# Patient Record
Sex: Male | Born: 1970
Health system: Southern US, Community
[De-identification: ages and names within clinical notes are randomized; demographics above are authoritative.]

## PROBLEM LIST (undated history)

## (undated) DIAGNOSIS — I5042 Chronic combined systolic (congestive) and diastolic (congestive) heart failure: Secondary | ICD-10-CM

## (undated) DIAGNOSIS — D619 Aplastic anemia, unspecified: Secondary | ICD-10-CM

## (undated) DIAGNOSIS — J189 Pneumonia, unspecified organism: Secondary | ICD-10-CM

## (undated) DIAGNOSIS — I10 Essential (primary) hypertension: Secondary | ICD-10-CM

## (undated) DIAGNOSIS — Z9289 Personal history of other medical treatment: Secondary | ICD-10-CM

## (undated) DIAGNOSIS — G4730 Sleep apnea, unspecified: Secondary | ICD-10-CM

## (undated) DIAGNOSIS — K759 Inflammatory liver disease, unspecified: Secondary | ICD-10-CM

## (undated) DIAGNOSIS — S36039A Unspecified laceration of spleen, initial encounter: Secondary | ICD-10-CM

## (undated) DIAGNOSIS — F1910 Other psychoactive substance abuse, uncomplicated: Secondary | ICD-10-CM

## (undated) DIAGNOSIS — M1990 Unspecified osteoarthritis, unspecified site: Secondary | ICD-10-CM

## (undated) HISTORY — PX: TIBIA FRACTURE SURGERY: SHX806

## (undated) HISTORY — PX: BONE MARROW ASPIRATION: SHX1252

## (undated) HISTORY — PX: ANKLE FRACTURE SURGERY: SHX122

## (undated) HISTORY — PX: FRACTURE SURGERY: SHX138

---

## 2000-01-13 ENCOUNTER — Emergency Department (HOSPITAL_COMMUNITY): Admission: EM | Admit: 2000-01-13 | Discharge: 2000-01-13 | Payer: Self-pay | Admitting: Emergency Medicine

## 2000-04-13 ENCOUNTER — Encounter: Payer: Self-pay | Admitting: Emergency Medicine

## 2000-04-13 ENCOUNTER — Emergency Department (HOSPITAL_COMMUNITY): Admission: EM | Admit: 2000-04-13 | Discharge: 2000-04-13 | Payer: Self-pay | Admitting: Emergency Medicine

## 2000-04-26 ENCOUNTER — Encounter: Payer: Self-pay | Admitting: Emergency Medicine

## 2000-04-26 ENCOUNTER — Emergency Department (HOSPITAL_COMMUNITY): Admission: EM | Admit: 2000-04-26 | Discharge: 2000-04-26 | Payer: Self-pay | Admitting: Emergency Medicine

## 2001-04-10 ENCOUNTER — Encounter: Payer: Self-pay | Admitting: Emergency Medicine

## 2001-04-10 ENCOUNTER — Emergency Department (HOSPITAL_COMMUNITY): Admission: EM | Admit: 2001-04-10 | Discharge: 2001-04-10 | Payer: Self-pay | Admitting: Emergency Medicine

## 2001-05-26 ENCOUNTER — Emergency Department (HOSPITAL_COMMUNITY): Admission: EM | Admit: 2001-05-26 | Discharge: 2001-05-26 | Payer: Self-pay

## 2009-04-18 ENCOUNTER — Emergency Department (HOSPITAL_COMMUNITY): Admission: EM | Admit: 2009-04-18 | Discharge: 2009-04-18 | Payer: Self-pay | Admitting: Emergency Medicine

## 2013-05-13 HISTORY — PX: INGUINAL HERNIA REPAIR: SUR1180

## 2014-10-17 ENCOUNTER — Inpatient Hospital Stay (HOSPITAL_COMMUNITY)
Admission: EM | Admit: 2014-10-17 | Discharge: 2014-10-20 | DRG: 194 | Disposition: A | Discharge status: Home / Self Care | Payer: Medicare Other | Attending: Internal Medicine | Admitting: Internal Medicine

## 2014-10-17 ENCOUNTER — Other Ambulatory Visit: Payer: Self-pay

## 2014-10-17 ENCOUNTER — Emergency Department (HOSPITAL_COMMUNITY): Payer: Medicare Other

## 2014-10-17 ENCOUNTER — Encounter (HOSPITAL_COMMUNITY): Payer: Self-pay

## 2014-10-17 DIAGNOSIS — Z8619 Personal history of other infectious and parasitic diseases: Secondary | ICD-10-CM

## 2014-10-17 DIAGNOSIS — E860 Dehydration: Secondary | ICD-10-CM | POA: Diagnosis not present

## 2014-10-17 DIAGNOSIS — N189 Chronic kidney disease, unspecified: Secondary | ICD-10-CM

## 2014-10-17 DIAGNOSIS — R079 Chest pain, unspecified: Secondary | ICD-10-CM | POA: Diagnosis not present

## 2014-10-17 DIAGNOSIS — J181 Lobar pneumonia, unspecified organism: Secondary | ICD-10-CM

## 2014-10-17 DIAGNOSIS — G4730 Sleep apnea, unspecified: Secondary | ICD-10-CM | POA: Diagnosis present

## 2014-10-17 DIAGNOSIS — F17210 Nicotine dependence, cigarettes, uncomplicated: Secondary | ICD-10-CM | POA: Diagnosis present

## 2014-10-17 DIAGNOSIS — F17200 Nicotine dependence, unspecified, uncomplicated: Secondary | ICD-10-CM | POA: Diagnosis not present

## 2014-10-17 DIAGNOSIS — Z720 Tobacco use: Secondary | ICD-10-CM

## 2014-10-17 DIAGNOSIS — M1990 Unspecified osteoarthritis, unspecified site: Secondary | ICD-10-CM | POA: Diagnosis present

## 2014-10-17 DIAGNOSIS — D613 Idiopathic aplastic anemia: Secondary | ICD-10-CM

## 2014-10-17 DIAGNOSIS — B182 Chronic viral hepatitis C: Secondary | ICD-10-CM | POA: Diagnosis not present

## 2014-10-17 DIAGNOSIS — R0602 Shortness of breath: Secondary | ICD-10-CM | POA: Diagnosis not present

## 2014-10-17 DIAGNOSIS — Z23 Encounter for immunization: Secondary | ICD-10-CM

## 2014-10-17 DIAGNOSIS — R0789 Other chest pain: Secondary | ICD-10-CM | POA: Diagnosis not present

## 2014-10-17 DIAGNOSIS — I10 Essential (primary) hypertension: Secondary | ICD-10-CM | POA: Diagnosis present

## 2014-10-17 DIAGNOSIS — D696 Thrombocytopenia, unspecified: Secondary | ICD-10-CM | POA: Diagnosis not present

## 2014-10-17 DIAGNOSIS — J189 Pneumonia, unspecified organism: Secondary | ICD-10-CM | POA: Diagnosis not present

## 2014-10-17 DIAGNOSIS — D609 Acquired pure red cell aplasia, unspecified: Secondary | ICD-10-CM | POA: Diagnosis not present

## 2014-10-17 DIAGNOSIS — N179 Acute kidney failure, unspecified: Secondary | ICD-10-CM

## 2014-10-17 DIAGNOSIS — D619 Aplastic anemia, unspecified: Secondary | ICD-10-CM | POA: Diagnosis present

## 2014-10-17 HISTORY — DX: Aplastic anemia, unspecified: D61.9

## 2014-10-17 HISTORY — DX: Unspecified osteoarthritis, unspecified site: M19.90

## 2014-10-17 HISTORY — DX: Inflammatory liver disease, unspecified: K75.9

## 2014-10-17 HISTORY — DX: Sleep apnea, unspecified: G47.30

## 2014-10-17 HISTORY — DX: Pneumonia, unspecified organism: J18.9

## 2014-10-17 HISTORY — DX: Essential (primary) hypertension: I10

## 2014-10-17 HISTORY — DX: Personal history of other medical treatment: Z92.89

## 2014-10-17 LAB — CBC WITH DIFFERENTIAL/PLATELET
Basophils Absolute: 0 10*3/uL (ref 0.0–0.1)
Basophils Relative: 0 % (ref 0–1)
Eosinophils Absolute: 0 10*3/uL (ref 0.0–0.7)
Eosinophils Relative: 0 % (ref 0–5)
HCT: 38.6 % — ABNORMAL LOW (ref 39.0–52.0)
Hemoglobin: 13.2 g/dL (ref 13.0–17.0)
Lymphocytes Relative: 17 % (ref 12–46)
Lymphs Abs: 0.9 10*3/uL (ref 0.7–4.0)
MCH: 34.6 pg — ABNORMAL HIGH (ref 26.0–34.0)
MCHC: 34.2 g/dL (ref 30.0–36.0)
MCV: 101 fL — ABNORMAL HIGH (ref 78.0–100.0)
Monocytes Absolute: 0.4 10*3/uL (ref 0.1–1.0)
Monocytes Relative: 8 % (ref 3–12)
Neutro Abs: 4.1 10*3/uL (ref 1.7–7.7)
Neutrophils Relative %: 75 % (ref 43–77)
Platelets: 68 10*3/uL — ABNORMAL LOW (ref 150–400)
RBC: 3.82 MIL/uL — ABNORMAL LOW (ref 4.22–5.81)
RDW: 15.7 % — ABNORMAL HIGH (ref 11.5–15.5)
WBC: 5.5 10*3/uL (ref 4.0–10.5)

## 2014-10-17 LAB — BASIC METABOLIC PANEL
Anion gap: 10 (ref 5–15)
BUN: 10 mg/dL (ref 6–20)
CO2: 24 mmol/L (ref 22–32)
Calcium: 9.1 mg/dL (ref 8.9–10.3)
Chloride: 102 mmol/L (ref 101–111)
Creatinine, Ser: 1.33 mg/dL — ABNORMAL HIGH (ref 0.61–1.24)
GFR calc Af Amer: >60 mL/min (ref >60)
GFR calc non Af Amer: >60 mL/min (ref >60)
Glucose, Bld: 109 mg/dL — ABNORMAL HIGH (ref 65–99)
Potassium: 4.4 mmol/L (ref 3.5–5.1)
Sodium: 136 mmol/L (ref 135–145)

## 2014-10-17 LAB — I-STAT TROPONIN, ED: Troponin i, poc: 0 ng/mL (ref 0.00–0.08)

## 2014-10-17 MED ORDER — IPRATROPIUM BROMIDE 0.02 % IN SOLN
0.5000 mg | Freq: Four times a day (QID) | RESPIRATORY_TRACT | Status: DC
  Administered 2014-10-17: 0.5 mg via RESPIRATORY_TRACT
  Filled 2014-10-17: qty 2.5

## 2014-10-17 MED ORDER — HYDROCODONE-ACETAMINOPHEN 5-325 MG PO TABS
1.0000 | ORAL_TABLET | ORAL | Status: DC | PRN
  Administered 2014-10-19: 1 via ORAL
  Filled 2014-10-17: qty 1

## 2014-10-17 MED ORDER — AZITHROMYCIN 500 MG PO TABS
500.0000 mg | ORAL_TABLET | ORAL | Status: DC
  Filled 2014-10-17: qty 1

## 2014-10-17 MED ORDER — SODIUM CHLORIDE 0.9 % IJ SOLN: 3.0000 mL | INTRAMUSCULAR | Status: DC | PRN

## 2014-10-17 MED ORDER — CEFTRIAXONE SODIUM 1 G IJ SOLR
1.0000 g | Freq: Once | INTRAMUSCULAR | Status: AC
  Administered 2014-10-17: 1 g via INTRAVENOUS
  Filled 2014-10-17: qty 10

## 2014-10-17 MED ORDER — SODIUM CHLORIDE 0.9 % IV SOLN: 250.0000 mL | INTRAVENOUS | Status: DC | PRN

## 2014-10-17 MED ORDER — ALBUTEROL SULFATE (2.5 MG/3ML) 0.083% IN NEBU
2.5000 mg | INHALATION_SOLUTION | Freq: Four times a day (QID) | RESPIRATORY_TRACT | Status: DC
  Administered 2014-10-17: 2.5 mg via RESPIRATORY_TRACT
  Filled 2014-10-17: qty 3

## 2014-10-17 MED ORDER — SODIUM CHLORIDE 0.9 % IV BOLUS (SEPSIS)
1000.0000 mL | Freq: Once | INTRAVENOUS | Status: AC
  Administered 2014-10-17: 1000 mL via INTRAVENOUS

## 2014-10-17 MED ORDER — ENOXAPARIN SODIUM 40 MG/0.4ML ~~LOC~~ SOLN
40.0000 mg | SUBCUTANEOUS | Status: DC
  Administered 2014-10-17: 40 mg via SUBCUTANEOUS
  Filled 2014-10-17 (×2): qty 0.4

## 2014-10-17 MED ORDER — PNEUMOCOCCAL VAC POLYVALENT 25 MCG/0.5ML IJ INJ
0.5000 mL | INJECTION | INTRAMUSCULAR | Status: AC
  Administered 2014-10-18: 0.5 mL via INTRAMUSCULAR
  Filled 2014-10-17: qty 0.5

## 2014-10-17 MED ORDER — CEFTRIAXONE SODIUM IN DEXTROSE 20 MG/ML IV SOLN
1.0000 g | INTRAVENOUS | Status: DC
  Filled 2014-10-17: qty 50

## 2014-10-17 MED ORDER — AZITHROMYCIN 250 MG PO TABS
500.0000 mg | ORAL_TABLET | Freq: Once | ORAL | Status: AC
Start: 2014-10-17 — End: 2014-10-17
  Administered 2014-10-17: 500 mg via ORAL
  Filled 2014-10-17: qty 2

## 2014-10-17 MED ORDER — ACETAMINOPHEN 325 MG PO TABS
650.0000 mg | ORAL_TABLET | Freq: Four times a day (QID) | ORAL | Status: DC | PRN
  Administered 2014-10-18: 650 mg via ORAL
  Filled 2014-10-17: qty 2

## 2014-10-17 MED ORDER — GUAIFENESIN ER 600 MG PO TB12
600.0000 mg | ORAL_TABLET | Freq: Two times a day (BID) | ORAL | Status: DC
  Administered 2014-10-18 – 2014-10-19 (×5): 600 mg via ORAL
  Filled 2014-10-17 (×8): qty 1

## 2014-10-17 MED ORDER — AZITHROMYCIN 500 MG PO TABS
500.0000 mg | ORAL_TABLET | ORAL | Status: DC
  Administered 2014-10-18 – 2014-10-19 (×2): 500 mg via ORAL
  Filled 2014-10-17 (×3): qty 1

## 2014-10-17 MED ORDER — ACETAMINOPHEN 650 MG RE SUPP: 650.0000 mg | Freq: Four times a day (QID) | RECTAL | Status: DC | PRN

## 2014-10-17 MED ORDER — ALBUTEROL SULFATE (2.5 MG/3ML) 0.083% IN NEBU
2.5000 mg | INHALATION_SOLUTION | RESPIRATORY_TRACT | Status: DC | PRN
Start: 2014-10-17 — End: 2014-10-20

## 2014-10-17 MED ORDER — KETOROLAC TROMETHAMINE 30 MG/ML IJ SOLN
30.0000 mg | Freq: Once | INTRAMUSCULAR | Status: AC
  Administered 2014-10-17: 30 mg via INTRAVENOUS
  Filled 2014-10-17: qty 1

## 2014-10-17 MED ORDER — CEFTRIAXONE SODIUM IN DEXTROSE 20 MG/ML IV SOLN
1.0000 g | INTRAVENOUS | Status: DC
  Administered 2014-10-18 – 2014-10-19 (×2): 1 g via INTRAVENOUS
  Filled 2014-10-17 (×3): qty 50

## 2014-10-17 MED ORDER — SODIUM CHLORIDE 0.9 % IJ SOLN
3.0000 mL | Freq: Two times a day (BID) | INTRAMUSCULAR | Status: DC
  Administered 2014-10-17 – 2014-10-18 (×3): 3 mL via INTRAVENOUS
  Administered 2014-10-19: 10 mL via INTRAVENOUS

## 2014-10-17 NOTE — H&P (Addendum)
[facility]                                                                                    Patient Demographics  Charles [name], is a 44 y.o. male  CSN: [id number]  MRN: [id number]  DOB - 08-29-1970  Admit Date - 10/17/2014  Outpatient Primary MD for the patient is No primary care provider on file.   With History of -  Past Medical History  Diagnosis Date  . Aplastic anemia       Past Surgical History  Procedure Laterality Date  . Ankle surgery Left   . Leg surgery Right     in for   Chief Complaint  Patient presents with  . Chest Pain  . Shortness of Breath     HPI  Charles [name]  is a 44 y.o. male, history of Aplastic anemia and hepatitis C, and who recently left jail around 4 months ago, presenting with 3 days history of increased shortness of breath, congestion  and nonproductive cough. No history of nausea or vomiting. He thinks that his roommate who is complaining of the same symptoms present might have given him this infection. No history of fever but complains of chills. In the emergency room the patient was noted to be febrile and his chest x-ray was positive for bilateral pneumonia. He tried over-the-counter medications which did not help    Review of Systems    In addition to the HPI above,  No Headache, No changes with Vision or hearing, No problems swallowing food or Liquids, No Abdominal pain, No Nausea or Vommitting, Bowel movements are regular, No Blood in stool or Urine, No dysuria, No new skin rashes or bruises, No new joints pains-aches,  No new weakness, tingling, numbness in any extremity, No recent weight gain or loss, No polyuria, polydypsia or polyphagia, No significant Mental Stressors.  A full 10 point Review of Systems was done, except as stated above, all other Review of Systems were negative.   Social History History  Substance Use Topics  . Smoking status: Current Every Day Smoker  . Smokeless tobacco: Not on  file  . Alcohol Use: Yes    Family History Significant for hypertension and his grandmother  Prior to Admission medications   Not on File    No Known Allergies  Physical Exam  Vitals  Blood pressure 129/68, pulse 97, temperature 98.5 F (36.9 C), temperature source Oral, resp. rate 29, height  (1.676 m), weight 90.719 kg (200 lb), SpO2 92 %.   1. General African-American male well-developed well-nourished  2. Normal affect and insight, Not Suicidal or Homicidal, Awake Alert, Oriented X 3.  3. No F.N deficits, ALL C.Nerves Intact, Strength 5/5 all 4 extremities, Sensation intact all 4 extremities, Plantars down going.  4. Ears and Eyes appear Normal, Conjunctivae clear, PERRLA. Moist Oral Mucosa.  5. Supple Neck, No JVD, No cervical lymphadenopathy appriciated, No Carotid Bruits.  6. Symmetrical Chest wall movement, decreased breath sounds at the bases no wheezing.  7. RRR, No Gallops, Rubs or Murmurs, No Parasternal Heave.  8. Positive Bowel Sounds, Abdomen Soft, Non tender, No organomegaly appriciated,No rebound -guarding  or rigidity.  9.  No Cyanosis, Normal Skin Turgor, No Skin Rash or Bruise.  10. Good muscle tone,  joints appear normal , no effusions, Normal ROM.  11. No Palpable Lymph Nodes in Neck or Axillae    Data Review  CBC  Recent Labs Lab 10/17/14 1643  WBC 5.5  HGB 13.2  HCT 38.6*  PLT 68*  MCV 101.0*  MCH 34.6*  MCHC 34.2  RDW 15.7*  LYMPHSABS 0.9  MONOABS 0.4  EOSABS 0.0  BASOSABS 0.0   ------------------------------------------------------------------------------------------------------------------  Chemistries   Recent Labs Lab 10/17/14 1643  NA 136  K 4.4  CL 102  CO2 24  GLUCOSE 109*  BUN 10  CREATININE 1.33*  CALCIUM 9.1   ------------------------------------------------------------------------------------------------------------------ estimated creatinine clearance is 75.6 mL/min (by C-G formula based on Cr  of 1.33). ------------------------------------------------------------------------------------------------------------------ No results for input(s): TSH, T4TOTAL, T3FREE, THYROIDAB in the last 72 hours.  Invalid input(s): FREET3   Coagulation profile No results for input(s): INR, PROTIME in the last 168 hours. ------------------------------------------------------------------------------------------------------------------- No results for input(s): DDIMER in the last 72 hours. -------------------------------------------------------------------------------------------------------------------  Cardiac Enzymes No results for input(s): CKMB, TROPONINI, MYOGLOBIN in the last 168 hours.  Invalid input(s): CK ------------------------------------------------------------------------------------------------------------------ Invalid input(s): POCBNP   ---------------------------------------------------------------------------------------------------------------  Urinalysis No results found for: COLORURINE, APPEARANCEUR, LABSPEC, PHURINE, GLUCOSEU, HGBUR, BILIRUBINUR, KETONESUR, PROTEINUR, UROBILINOGEN, NITRITE, LEUKOCYTESUR  ----------------------------------------------------------------------------------------------------------------  Imaging results:   Dg Chest 2 View  10/17/2014   CLINICAL DATA:  Chest pain, shortness of breath, and dizziness since this morning, smoker  EXAM: CHEST  2 VIEW  COMPARISON:  None  FINDINGS: Upper normal size of cardiac silhouette.  Mediastinal contours and pulmonary vascularity normal.  Diffuse BILATERAL pulmonary infiltrates slightly greater on RIGHT most likely representing pneumonia.  Minimal central peribronchial thickening.  No pleural effusion or pneumothorax.  Bones unremarkable.  IMPRESSION: BILATERAL pulmonary infiltrates slightly greater on RIGHT most consistent with pneumonia.   Electronically Signed   By: Ulyses Southward M.D.   On: 10/17/2014 13:06       Assessment & Plan  1. Bilateral pneumonia     Patient was started on IV Rocephin and Zithromax     Nebulizer treatment     Oxygen     Check pneumoniae antigens  2. History of aplastic anemia     Patient denies any history of treatment  3. History of hepatitis C probably according to the patient     Can be worked as outpatient     And arrange for follow-up after discharge        DVT Prophylaxis Lovenox  AM Labs Ordered, also please review Full Orders  Code Status full  Disposition Plan: Back home  Time spent in minutes : 32 minutes  Condition GUARDED   @SIGNATURE @

## 2014-10-17 NOTE — ED Notes (Signed)
Pt states he was lying down yesterday in bed and started having coughing, SOB, and chest pain. Thought it was his sinuses but it didn't help when he bought OTC meds. His chest hurts when he coughs and can't breathe.

## 2014-10-17 NOTE — ED Provider Notes (Signed)
CSN: [id number]     Arrival date & time 10/17/14  1223 History   First MD Initiated Contact with Patient 10/17/14 1634     Chief Complaint  Patient presents with  . Chest Pain  . Shortness of Breath     (Consider location/radiation/quality/duration/timing/severity/associated sxs/prior Treatment) HPI  Pt is a 44yo male with hx of aplastic anemia, presenting to ED with c/o sudden onset SOB, cough, congestion and bilateral chest pain that is worse with deep inspiration. Pt also c/o fatigue, feer and chills.  States he bought OTC cough medication last night but no relief. Symptoms worse when lying down.  States his roommate has had similar symptoms but has not been to doctor yet. Denies recent travel. Pt states he does not have a PCP as he was released from jail 4 months ago, has not established care yet.  Denies hx of asthma. Pt is a daily smoker.  Denies abdominal pain, n/v/d. Denies hx of CAD, DVT or PE.   Past Medical History  Diagnosis Date  . Aplastic anemia    Past Surgical History  Procedure Laterality Date  . Ankle surgery Left   . Leg surgery Right    No family history on file. History  Substance Use Topics  . Smoking status: Current Every Day Smoker  . Smokeless tobacco: Not on file  . Alcohol Use: Yes    Review of Systems  Constitutional: Positive for fever, chills, appetite change and fatigue. Negative for diaphoresis.  HENT: Positive for congestion. Negative for ear pain, sore throat, trouble swallowing and voice change.   Respiratory: Positive for cough and shortness of breath.   Cardiovascular: Positive for chest pain (bilateral). Negative for palpitations and leg swelling.  Gastrointestinal: Negative for nausea, vomiting, abdominal pain and diarrhea.  Musculoskeletal: Negative for myalgias and arthralgias.  All other systems reviewed and are negative.     Allergies  Review of patient's allergies indicates no known allergies.  Home Medications   Prior to  Admission medications   Not on File   BP 129/68 mmHg  Pulse 97  Temp(Src) 98.5 F (36.9 C) (Oral)  Resp 29  Ht 5\' 6"  (1.676 m)  Wt 200 lb (90.719 kg)  BMI 32.30 kg/m2  SpO2 92% Physical Exam  Constitutional: He is oriented to person, place, and time. He appears well-developed and well-nourished. He appears distressed.  Pt lying on Right side curled in blanket, appears uncomfortable.  HENT:  Head: Normocephalic and atraumatic.  Nose: Nose normal.  Mouth/Throat: Uvula is midline, oropharynx is clear and moist and mucous membranes are normal.  Eyes: Conjunctivae are normal. No scleral icterus.  Neck: Normal range of motion. Neck supple.  Cardiovascular: Regular rhythm and normal heart sounds.  Tachycardia present.   Pulmonary/Chest: Effort normal. No respiratory distress. He has no wheezes. He has rales. He exhibits no tenderness.  No respiratory distress, able to speak in full sentences w/o difficulty. Lungs: decreased breath sounds in RLQ with crackles. No wheeze. No chest wall tenderness.  Abdominal: Soft. Bowel sounds are normal. He exhibits no distension and no mass. There is no tenderness. There is no rebound and no guarding.  Musculoskeletal: Normal range of motion.  Neurological: He is alert and oriented to person, place, and time.  Skin: Skin is warm and dry. No rash noted. He is not diaphoretic.  Nursing note and vitals reviewed.   ED Course  Procedures (including critical care time) Labs Review Labs Reviewed  BASIC METABOLIC PANEL - Abnormal; Notable for the  following:    Glucose, Bld 109 (*)    Creatinine, Ser 1.33 (*)    All other components within normal limits  CBC WITH DIFFERENTIAL/PLATELET - Abnormal; Notable for the following:    RBC 3.82 (*)    HCT 38.6 (*)    MCV 101.0 (*)    MCH 34.6 (*)    RDW 15.7 (*)    Platelets 68 (*)    All other components within normal limits  I-STAT TROPOININ, ED    Imaging Review Dg Chest 2 View  10/17/2014   CLINICAL  DATA:  Chest pain, shortness of breath, and dizziness since this morning, smoker  EXAM: CHEST  2 VIEW  COMPARISON:  None  FINDINGS: Upper normal size of cardiac silhouette.  Mediastinal contours and pulmonary vascularity normal.  Diffuse BILATERAL pulmonary infiltrates slightly greater on RIGHT most likely representing pneumonia.  Minimal central peribronchial thickening.  No pleural effusion or pneumothorax.  Bones unremarkable.  IMPRESSION: BILATERAL pulmonary infiltrates slightly greater on RIGHT most consistent with pneumonia.   Electronically Signed   By: Ulyses Southward M.D.   On: 10/17/2014 13:06     EKG Interpretation   Date/Time:  Monday October 17 2014 12:29:51 EDT Ventricular Rate:  109 PR Interval:  130 QRS Duration: 62 QT Interval:  340 QTC Calculation: 457 R Axis:   82 Text Interpretation:  Sinus tachycardia Otherwise normal ECG since last  tracing no significant change Confirmed by BELFI  MD, MELANIE ([id number]) on  10/17/2014 5:22:50 PM      MDM   Final diagnoses:  Bilateral pneumonia  CAP (community acquired pneumonia)    Pt is a 44yo male presenting to ED with sudden onset bilateral chest pain, SOB, cough, hot and cold chills. Pt appears uncomfortable. SOB worse with movement and lying down. Decreased breath sounds in RLL fields.    CXR: c/w Bilateral pulmonary infiltrated most c/w pneumonia.  Pt is febrile with temp of 100.6, mild tachycardia.  Respirations increased to 29 with O2 Sat of 92% on RA. Discussed pt with Dr. Fredderick Phenix, will admit for continued care due to severity of lung findings and worsening vitals in ED.    6:28 PM Consulted with Dr. Sharyon Medicus, [facility], agreed to have pt admitted to [facility] for further treatment of bilateral pneumonia.   Junius Finner, PA-C 10/17/14 1829  Rolan Bucco, MD 10/17/14 [phone]

## 2014-10-17 NOTE — Progress Notes (Signed)
Pt received antibiotic in the ED, prior to blood cultures drawn. Made Dr. Sharyon Medicus aware. No new orders given, will continue to monitor.

## 2014-10-18 DIAGNOSIS — Z8619 Personal history of other infectious and parasitic diseases: Secondary | ICD-10-CM

## 2014-10-18 DIAGNOSIS — J189 Pneumonia, unspecified organism: Secondary | ICD-10-CM | POA: Diagnosis not present

## 2014-10-18 DIAGNOSIS — D609 Acquired pure red cell aplasia, unspecified: Secondary | ICD-10-CM

## 2014-10-18 DIAGNOSIS — E860 Dehydration: Secondary | ICD-10-CM

## 2014-10-18 LAB — STREP PNEUMONIAE URINARY ANTIGEN: Strep Pneumo Urinary Antigen: NEGATIVE

## 2014-10-18 LAB — CBC
HEMATOCRIT: 35.6 % — AB (ref 39.0–52.0)
HEMOGLOBIN: 12.1 g/dL — AB (ref 13.0–17.0)
MCH: 34.5 pg — AB (ref 26.0–34.0)
MCHC: 34 g/dL (ref 30.0–36.0)
MCV: 101.4 fL — AB (ref 78.0–100.0)
Platelets: 52 10*3/uL — ABNORMAL LOW (ref 150–400)
RBC: 3.51 MIL/uL — ABNORMAL LOW (ref 4.22–5.81)
RDW: 15.7 % — ABNORMAL HIGH (ref 11.5–15.5)
WBC: 4.2 10*3/uL (ref 4.0–10.5)

## 2014-10-18 MED ORDER — SODIUM CHLORIDE 0.9 % IV SOLN
INTRAVENOUS | Status: AC
Start: 2014-10-18 — End: 2014-10-19
  Administered 2014-10-18: 1000 mL via INTRAVENOUS
  Administered 2014-10-19: 03:00:00 via INTRAVENOUS

## 2014-10-18 MED ORDER — ALBUTEROL SULFATE (2.5 MG/3ML) 0.083% IN NEBU: 2.5000 mg | INHALATION_SOLUTION | Freq: Three times a day (TID) | RESPIRATORY_TRACT | Status: DC

## 2014-10-18 MED ORDER — IPRATROPIUM-ALBUTEROL 0.5-2.5 (3) MG/3ML IN SOLN
3.0000 mL | Freq: Three times a day (TID) | RESPIRATORY_TRACT | Status: DC
  Administered 2014-10-18 – 2014-10-20 (×7): 3 mL via RESPIRATORY_TRACT
  Filled 2014-10-18 (×7): qty 3

## 2014-10-18 NOTE — Care Management Note (Signed)
Case Management Note  Patient Details  Name: Charles [name] MRN: [id number] Date of Birth: 1970/10/21  Subjective/Objective:                 Pt lives in the extended stay hotel off of [address], that is paid for by medicaid. Pt states that he has medicaid for aplastic anemia.  He goes to a drug rehab program close to there that he states he put himself in. He states that he got out of prison 3-4 months ago, that he was incarcerated for two years for larceny. He also states that he has sister that is supportive and checks in on him frequently.  Action/Plan:  Will continue to follow for discharge plans. Expected Discharge Date:                  Expected Discharge Plan:  Home/Self Care (Lives at Extended stay off [facility]. )  In-House Referral:     Discharge planning Services     Post Acute Care Choice:    Choice offered to:     DME Arranged:    DME Agency:     HH Arranged:    HH Agency:     Status of Service:  In process, will continue to follow  Medicare Important Message Given:  No Date Medicare IM Given:    Medicare IM give by:    Date Additional Medicare IM Given:    Additional Medicare Important Message give by:     If discussed at Long Length of Stay Meetings, dates discussed:    Additional Comments:  Lawerance Sabal, RN 10/18/2014, 12:39 PM

## 2014-10-18 NOTE — Progress Notes (Signed)
[facility] PROGRESS NOTE  Sharon Bente ZOX:[id number] DOB: [birth year] DOA: 10/17/2014  PCP: No PCP Per Patient  Brief HPI: 44 year old African-American male with a past medical history of hepatitis C, aplastic anemia who was recently imprisoned until about 4 months ago, presented with a three-day history of increasing shortness of breath and a nonproductive cough. Chest x-ray revealed bilateral pneumonia. He was hospitalized for further management.  Past medical history:  Past Medical History  Diagnosis Date  . Hypertension   . History of blood transfusion "several"    "related to aplastic anemia"  . Aplastic anemia   . Hepatitis     "think it was B" (10/17/2014)  . Arthritis     "left ankle" (10/17/2014)  . Sleep apnea     "wore mask in prison; 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" (10/17/2014)  . Bilateral pneumonia 10/17/2014    Consultants: None  Procedures: None  Antibiotics: Ceftriaxone and azithromycin. 6/6  Subjective: Patient feels about the same today. Continues to have a dry cough. Some difficulty with breathing. Denies any chest pain. No nausea, vomiting. No abdominal discomfort.  Objective: Vital Signs  Filed Vitals:   10/17/14 2000 10/17/14 2042 10/18/14 0522 10/18/14 0739  BP:   126/79   Pulse:   83   Temp:   98.7 F (37.1 C)   TempSrc:   Oral   Resp: 20  18   Height:      Weight:      SpO2:  96% 97% 92%    Intake/Output Summary (Last 24 hours) at 10/18/14 0848 Last data filed at 10/18/14 0550  Gross per 24 hour  Intake      0 ml  Output    300 ml  Net   -300 ml   Filed Weights   10/17/14 1233 10/17/14 1944  Weight: 90.719 kg (200 lb) 88.6 kg (195 lb 5.2 oz)    General appearance: alert, cooperative, appears stated age and no distress Resp: Crackles bilateral bases. No wheezing. No rhonchi. Cardio: regular rate and rhythm, S1, S2 normal, no murmur, click, rub or gallop GI: soft, non-tender; bowel sounds normal; no masses,  no organomegaly Extremities:  extremities normal, atraumatic, no cyanosis or edema Neurologic: Alert and oriented 3. No focal neurological deficits.  Lab Results:  Basic Metabolic Panel:  Recent Labs Lab 10/17/14 1643  NA 136  K 4.4  CL 102  CO2 24  GLUCOSE 109*  BUN 10  CREATININE 1.33*  CALCIUM 9.1   CBC:  Recent Labs Lab 10/17/14 1643 10/18/14 0547  WBC 5.5 4.2  NEUTROABS 4.1  --   HGB 13.2 12.1*  HCT 38.6* 35.6*  MCV 101.0* 101.4*  PLT 68* 52*    Studies/Results: Dg Chest 2 View  10/17/2014   CLINICAL DATA:  Chest pain, shortness of breath, and dizziness since this morning, smoker  EXAM: CHEST  2 VIEW  COMPARISON:  None  FINDINGS: Upper normal size of cardiac silhouette.  Mediastinal contours and pulmonary vascularity normal.  Diffuse BILATERAL pulmonary infiltrates slightly greater on RIGHT most likely representing pneumonia.  Minimal central peribronchial thickening.  No pleural effusion or pneumothorax.  Bones unremarkable.  IMPRESSION: BILATERAL pulmonary infiltrates slightly greater on RIGHT most consistent with pneumonia.   Electronically Signed   By: Ulyses Southward M.D.   On: 10/17/2014 13:06    Medications:  Scheduled: . azithromycin  500 mg Oral Q24H  . cefTRIAXone (ROCEPHIN)  IV  1 g Intravenous Q24H  . guaiFENesin  600 mg  Oral BID  . ipratropium-albuterol  3 mL Nebulization TID  . sodium chloride  3 mL Intravenous Q12H   Continuous:  XBM:WUXLKG chloride, acetaminophen **OR** acetaminophen, albuterol, HYDROcodone-acetaminophen, sodium chloride  Assessment/Plan:  Active Problems:   Bilateral pneumonia   PNA (pneumonia)    Bilateral pneumonia/likely community-acquired Continue antibiotics. Urine for strep and legionella is pending. Order HIV test as well. Patient remains stable. Oxygen as needed. Mucinex.  History of aplastic anemia with thrombocytopenia Platelet counts did drop some today. We will discontinue the Lovenox. Monitor the counts closely. No overt bleeding.  Mild  dehydration Continue IV fluids. Recheck labs in the morning.  History of hepatitis C Check LFTs. Check hepatitis panel.  DVT Prophylaxis: SCDs. Stop Lovenox due to low platelet counts.    Code Status: Full code  Family Communication: Discussed with the patient  Disposition Plan: Continue current treatment. Will likely return home when improved. Social worker to find out if patient does have a place to stay.  Follow-up Appointment?: Needs PCP   LOS: 1 day   [facility]  [facility] Pager [phone] 10/18/2014, 8:48 AM  If 7PM-7AM, please contact night-coverage at [email or web address], password [facility]

## 2014-10-18 NOTE — Progress Notes (Signed)
Utilization Review completed. Jayonna Meyering RN BSN CM 

## 2014-10-19 DIAGNOSIS — J181 Lobar pneumonia, unspecified organism: Secondary | ICD-10-CM

## 2014-10-19 DIAGNOSIS — N179 Acute kidney failure, unspecified: Secondary | ICD-10-CM

## 2014-10-19 DIAGNOSIS — D696 Thrombocytopenia, unspecified: Secondary | ICD-10-CM

## 2014-10-19 DIAGNOSIS — F17200 Nicotine dependence, unspecified, uncomplicated: Secondary | ICD-10-CM

## 2014-10-19 DIAGNOSIS — Z720 Tobacco use: Secondary | ICD-10-CM

## 2014-10-19 DIAGNOSIS — N189 Chronic kidney disease, unspecified: Secondary | ICD-10-CM

## 2014-10-19 LAB — COMPREHENSIVE METABOLIC PANEL
ALK PHOS: 41 U/L (ref 38–126)
ALT: 22 U/L (ref 17–63)
ANION GAP: 9 (ref 5–15)
AST: 24 U/L (ref 15–41)
Albumin: 2.8 g/dL — ABNORMAL LOW (ref 3.5–5.0)
CO2: 24 mmol/L (ref 22–32)
Calcium: 8.6 mg/dL — ABNORMAL LOW (ref 8.9–10.3)
Chloride: 104 mmol/L (ref 101–111)
Creatinine, Ser: 1 mg/dL (ref 0.61–1.24)
GFR calc Af Amer: >60 mL/min (ref >60)
GFR calc non Af Amer: >60 mL/min (ref >60)
GLUCOSE: 114 mg/dL — AB (ref 65–99)
Potassium: 4 mmol/L (ref 3.5–5.1)
SODIUM: 137 mmol/L (ref 135–145)
Total Bilirubin: 0.7 mg/dL (ref 0.3–1.2)
Total Protein: 6.5 g/dL (ref 6.5–8.1)

## 2014-10-19 LAB — CBC
HCT: 35.1 % — ABNORMAL LOW (ref 39.0–52.0)
Hemoglobin: 12.1 g/dL — ABNORMAL LOW (ref 13.0–17.0)
MCH: 34.5 pg — AB (ref 26.0–34.0)
MCHC: 34.5 g/dL (ref 30.0–36.0)
MCV: 100 fL (ref 78.0–100.0)
Platelets: 59 10*3/uL — ABNORMAL LOW (ref 150–400)
RBC: 3.51 MIL/uL — ABNORMAL LOW (ref 4.22–5.81)
RDW: 15 % (ref 11.5–15.5)
WBC: 3.8 10*3/uL — ABNORMAL LOW (ref 4.0–10.5)

## 2014-10-19 LAB — LEGIONELLA ANTIGEN, URINE

## 2014-10-19 LAB — HIV ANTIBODY (ROUTINE TESTING W REFLEX): HIV SCREEN 4TH GENERATION: NONREACTIVE

## 2014-10-19 MED ORDER — SALINE SPRAY 0.65 % NA SOLN
1.0000 | NASAL | Status: DC | PRN
  Administered 2014-10-19: 1 via NASAL
  Filled 2014-10-19: qty 44

## 2014-10-19 NOTE — Progress Notes (Signed)
PROGRESS NOTE  Charles [name]:[id number] DOB: 02-Jul-1970 DOA: 10/17/2014 PCP: No PCP Per Patient Brief history 44 year old African-American male with a past medical history of hepatitis C, aplastic anemia who was recently imprisoned until about 4 months ago, presented with a three-day history of increasing shortness of breath and a nonproductive cough. Chest x-ray revealed bilateral pneumonia. He was hospitalized for further management. Assessment/Plan:  Bilateral pneumonia/likely community-acquired Continue antibiotics.  -Urine for strep and legionella neg.  -HIV test--neg -Continue Mucinex. -Clinically stable on room air -Will need appointment at [facility] wellness clinic  History of aplastic anemia with thrombocytopenia Platelet counts did drop some today but this is partly due to dilution from IVF -Discontinued Lovenox  -No overt signs of bleeding  -Hemoglobin stable  -Check serum B12  -HIV negative  Mild dehydration/AKI -Improving with Continued IV fluids.   History of hepatitis C Check LFTs--normal -Check viral hepatitis panel--pending Tobacco abuse -Tobacco cessation discussed  DVT Prophylaxis: SCDs. Stop Lovenox due to low platelet counts.  Code Status: Full code  Family Communication: Discussed with the patient and niece at bedside Disposition Plan: home 6/9 if stable       Procedures/Studies: Dg Chest 2 View  10/17/2014   CLINICAL DATA:  Chest pain, shortness of breath, and dizziness since this morning, smoker  EXAM: CHEST  2 VIEW  COMPARISON:  None  FINDINGS: Upper normal size of cardiac silhouette.  Mediastinal contours and pulmonary vascularity normal.  Diffuse BILATERAL pulmonary infiltrates slightly greater on RIGHT most likely representing pneumonia.  Minimal central peribronchial thickening.  No pleural effusion or pneumothorax.  Bones unremarkable.  IMPRESSION: BILATERAL pulmonary infiltrates slightly greater on RIGHT most consistent  with pneumonia.   Electronically Signed   By: Ulyses Southward M.D.   On: 10/17/2014 13:06         Subjective:  patient is breathing 50% better. He denies any fevers, chills, chest pain, nausea, vomiting, diarrhea, abdominal pain. No hemoptysis.   Objective: Filed Vitals:   10/18/14 2042 10/19/14 0549 10/19/14 0734 10/19/14 1417  BP:  134/85  149/75  Pulse:  77  93  Temp:  98.4 F (36.9 C)  99.1 F (37.3 C)  TempSrc:  Oral  Oral  Resp:  18  16  Height:      Weight:      SpO2: 99% 99% 94% 97%    Intake/Output Summary (Last 24 hours) at 10/19/14 1928 Last data filed at 10/19/14 1417  Gross per 24 hour  Intake 1748.83 ml  Output   2590 ml  Net -841.17 ml   Weight change:  Exam:   General:  Pt is alert, follows commands appropriately, not in acute distress  HEENT: No icterus, No thrush [place]/AT  Cardiovascular: RRR, S1/S2, no rubs, no gallops  Respiratory: bibasilar crackles without any wheeze   Abdomen: Soft/+BS, non tender, non distended, no guarding  Extremities: No edema, No lymphangitis, No petechiae, No rashes, no synovitis  Data Reviewed: Basic Metabolic Panel:  Recent Labs Lab 10/17/14 1643 10/19/14 0711  NA 136 137  K 4.4 4.0  CL 102 104  CO2 24 24  GLUCOSE 109* 114*  BUN 10 <5*  CREATININE 1.33* 1.00  CALCIUM 9.1 8.6*   Liver Function Tests:  Recent Labs Lab 10/19/14 0711  AST 24  ALT 22  ALKPHOS 41  BILITOT 0.7  PROT 6.5  ALBUMIN 2.8*   No results for input(s): LIPASE, AMYLASE in the last  168 hours. No results for input(s): AMMONIA in the last 168 hours. CBC:  Recent Labs Lab 10/17/14 1643 10/18/14 0547 10/19/14 0711  WBC 5.5 4.2 3.8*  NEUTROABS 4.1  --   --   HGB 13.2 12.1* 12.1*  HCT 38.6* 35.6* 35.1*  MCV 101.0* 101.4* 100.0  PLT 68* 52* 59*   Cardiac Enzymes: No results for input(s): CKTOTAL, CKMB, CKMBINDEX, TROPONINI in the last 168 hours. BNP: Invalid input(s): POCBNP CBG: No results for input(s): GLUCAP in the  last 168 hours.  Recent Results (from the past 240 hour(s))  Culture, blood (routine x 2) Call MD if unable to obtain prior to antibiotics being given     Status: None (Preliminary result)   Collection Time: 10/17/14  8:50 PM  Result Value Ref Range Status   Specimen Description BLOOD RIGHT ANTECUBITAL  Final   Special Requests BOTTLES DRAWN AEROBIC AND ANAEROBIC 10CC EA  Final   Culture   Final           BLOOD CULTURE RECEIVED NO GROWTH TO DATE CULTURE WILL BE HELD FOR 5 DAYS BEFORE ISSUING A FINAL NEGATIVE REPORT Note: Culture results may be compromised due to an excessive volume of blood received in culture bottles. Performed at [facility]    Report Status PENDING  Incomplete  Culture, blood (routine x 2) Call MD if unable to obtain prior to antibiotics being given     Status: None (Preliminary result)   Collection Time: 10/17/14  9:04 PM  Result Value Ref Range Status   Specimen Description BLOOD LEFT ARM  Final   Special Requests   Final    BOTTLES DRAWN AEROBIC AND ANAEROBIC 10CC BLUE 5CC PURPLE   Culture   Final           BLOOD CULTURE RECEIVED NO GROWTH TO DATE CULTURE WILL BE HELD FOR 5 DAYS BEFORE ISSUING A FINAL NEGATIVE REPORT Performed at [facility]    Report Status PENDING  Incomplete     Scheduled Meds: . azithromycin  500 mg Oral Q24H  . cefTRIAXone (ROCEPHIN)  IV  1 g Intravenous Q24H  . guaiFENesin  600 mg Oral BID  . ipratropium-albuterol  3 mL Nebulization TID  . sodium chloride  3 mL Intravenous Q12H   Continuous Infusions:    Lawyer Washabaugh, DO  [facility] Pager [phone]  If 7PM-7AM, please contact night-coverage [email or web address] Password [email or web address] 10/19/2014, 7:28 PM   LOS: 2 days

## 2014-10-19 NOTE — Progress Notes (Signed)
Patient received pamphlet from [facility]. CM explained to patient that they may use the on site pharmacy to fill prescriptions given to them at discharge. Patient aware that the [facility] will not fill narcotics or pain medications prior to the patient being seen by one of their physicians.  Patient aware that they must be seen as a patient prior to the pharmacy filling the prescriptions a second time. Either follow up appointment date and time entered into AVS, or instructions for patient to call and schedule appointment after discharge. 

## 2014-10-20 DIAGNOSIS — N179 Acute kidney failure, unspecified: Secondary | ICD-10-CM

## 2014-10-20 LAB — BASIC METABOLIC PANEL
Anion gap: 7 (ref 5–15)
BUN: 5 mg/dL — ABNORMAL LOW (ref 6–20)
CO2: 27 mmol/L (ref 22–32)
Calcium: 9 mg/dL (ref 8.9–10.3)
Chloride: 103 mmol/L (ref 101–111)
Creatinine, Ser: 1.06 mg/dL (ref 0.61–1.24)
GFR calc Af Amer: >60 mL/min (ref >60)
GFR calc non Af Amer: >60 mL/min (ref >60)
GLUCOSE: 116 mg/dL — AB (ref 65–99)
Potassium: 4.2 mmol/L (ref 3.5–5.1)
Sodium: 137 mmol/L (ref 135–145)

## 2014-10-20 LAB — HEPATITIS PANEL, ACUTE
Hep A IgM: NEGATIVE
Hep B C IgM: NEGATIVE
Hepatitis B Surface Ag: NEGATIVE

## 2014-10-20 LAB — CBC
HCT: 36.2 % — ABNORMAL LOW (ref 39.0–52.0)
Hemoglobin: 12.6 g/dL — ABNORMAL LOW (ref 13.0–17.0)
MCH: 34.7 pg — ABNORMAL HIGH (ref 26.0–34.0)
MCHC: 34.8 g/dL (ref 30.0–36.0)
MCV: 99.7 fL (ref 78.0–100.0)
Platelets: 59 10*3/uL — ABNORMAL LOW (ref 150–400)
RBC: 3.63 MIL/uL — ABNORMAL LOW (ref 4.22–5.81)
RDW: 14.9 % (ref 11.5–15.5)
WBC: 3.8 10*3/uL — AB (ref 4.0–10.5)

## 2014-10-20 LAB — VITAMIN B12: Vitamin B-12: 299 pg/mL (ref 180–914)

## 2014-10-20 MED ORDER — FLUTICASONE PROPIONATE 50 MCG/ACT NA SUSP
2.0000 | Freq: Every day | NASAL | Status: DC
  Filled 2014-10-20: qty 16

## 2014-10-20 MED ORDER — AZITHROMYCIN 500 MG PO TABS: 500.0000 mg | ORAL_TABLET | ORAL | Status: DC

## 2014-10-20 MED ORDER — FLUTICASONE PROPIONATE 50 MCG/ACT NA SUSP: 2.0000 | Freq: Every day | NASAL | Status: DC

## 2014-10-20 MED ORDER — CEFDINIR 300 MG PO CAPS: 300.0000 mg | ORAL_CAPSULE | Freq: Two times a day (BID) | ORAL | Status: DC

## 2014-10-20 NOTE — Progress Notes (Signed)
Pt discharging to home, Telemetry dc'd, IV dc'd. Vitals stable. Discharge instructions and education reviewed with pt all questions addressed. 10/20/2014 10:09 AM Charles [name]
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
## 2014-10-24 LAB — CULTURE, BLOOD (ROUTINE X 2)
CULTURE: NO GROWTH
Culture: NO GROWTH

## 2014-11-24 DIAGNOSIS — D619 Aplastic anemia, unspecified: Secondary | ICD-10-CM | POA: Diagnosis not present

## 2014-12-23 DIAGNOSIS — I10 Essential (primary) hypertension: Secondary | ICD-10-CM | POA: Diagnosis not present

## 2015-03-10 ENCOUNTER — Emergency Department (HOSPITAL_COMMUNITY)
Admission: EM | Admit: 2015-03-10 | Discharge: 2015-03-10 | Disposition: A | Discharge status: Home / Self Care | Payer: Medicare Other | Attending: Emergency Medicine | Admitting: Emergency Medicine

## 2015-03-10 ENCOUNTER — Emergency Department (HOSPITAL_COMMUNITY): Payer: Medicare Other

## 2015-03-10 ENCOUNTER — Encounter (HOSPITAL_COMMUNITY): Payer: Self-pay | Admitting: Emergency Medicine

## 2015-03-10 DIAGNOSIS — Z8669 Personal history of other diseases of the nervous system and sense organs: Secondary | ICD-10-CM | POA: Insufficient documentation

## 2015-03-10 DIAGNOSIS — Z862 Personal history of diseases of the blood and blood-forming organs and certain disorders involving the immune mechanism: Secondary | ICD-10-CM | POA: Insufficient documentation

## 2015-03-10 DIAGNOSIS — Z8719 Personal history of other diseases of the digestive system: Secondary | ICD-10-CM | POA: Diagnosis not present

## 2015-03-10 DIAGNOSIS — B9689 Other specified bacterial agents as the cause of diseases classified elsewhere: Secondary | ICD-10-CM | POA: Diagnosis not present

## 2015-03-10 DIAGNOSIS — N5089 Other specified disorders of the male genital organs: Secondary | ICD-10-CM

## 2015-03-10 DIAGNOSIS — I10 Essential (primary) hypertension: Secondary | ICD-10-CM | POA: Insufficient documentation

## 2015-03-10 DIAGNOSIS — Z720 Tobacco use: Secondary | ICD-10-CM | POA: Insufficient documentation

## 2015-03-10 DIAGNOSIS — Z8739 Personal history of other diseases of the musculoskeletal system and connective tissue: Secondary | ICD-10-CM | POA: Insufficient documentation

## 2015-03-10 DIAGNOSIS — Z8701 Personal history of pneumonia (recurrent): Secondary | ICD-10-CM | POA: Diagnosis not present

## 2015-03-10 DIAGNOSIS — N50812 Left testicular pain: Secondary | ICD-10-CM

## 2015-03-10 DIAGNOSIS — Z792 Long term (current) use of antibiotics: Secondary | ICD-10-CM | POA: Diagnosis not present

## 2015-03-10 DIAGNOSIS — R1909 Other intra-abdominal and pelvic swelling, mass and lump: Secondary | ICD-10-CM | POA: Diagnosis present

## 2015-03-10 DIAGNOSIS — N453 Epididymo-orchitis: Secondary | ICD-10-CM | POA: Diagnosis not present

## 2015-03-10 DIAGNOSIS — N433 Hydrocele, unspecified: Secondary | ICD-10-CM | POA: Diagnosis not present

## 2015-03-10 DIAGNOSIS — Z7951 Long term (current) use of inhaled steroids: Secondary | ICD-10-CM | POA: Diagnosis not present

## 2015-03-10 LAB — URINALYSIS, ROUTINE W REFLEX MICROSCOPIC
BILIRUBIN URINE: NEGATIVE
GLUCOSE, UA: NEGATIVE mg/dL
KETONES UR: NEGATIVE mg/dL
Leukocytes, UA: NEGATIVE
Nitrite: NEGATIVE
Protein, ur: NEGATIVE mg/dL
SPECIFIC GRAVITY, URINE: 1.013 (ref 1.005–1.030)
Urobilinogen, UA: 1 mg/dL (ref 0.0–1.0)
pH: 6 (ref 5.0–8.0)

## 2015-03-10 LAB — CBC
HCT: 34.8 % — ABNORMAL LOW (ref 39.0–52.0)
Hemoglobin: 12.2 g/dL — ABNORMAL LOW (ref 13.0–17.0)
MCH: 34.7 pg — ABNORMAL HIGH (ref 26.0–34.0)
MCHC: 35.1 g/dL (ref 30.0–36.0)
MCV: 98.9 fL (ref 78.0–100.0)
Platelets: 54 10*3/uL — ABNORMAL LOW (ref 150–400)
RBC: 3.52 MIL/uL — ABNORMAL LOW (ref 4.22–5.81)
RDW: 14 % (ref 11.5–15.5)
WBC: 5.9 10*3/uL (ref 4.0–10.5)

## 2015-03-10 LAB — BASIC METABOLIC PANEL
Anion gap: 7 (ref 5–15)
BUN: 8 mg/dL (ref 6–20)
CHLORIDE: 100 mmol/L — AB (ref 101–111)
CO2: 25 mmol/L (ref 22–32)
CREATININE: 1.15 mg/dL (ref 0.61–1.24)
Calcium: 8.9 mg/dL (ref 8.9–10.3)
GFR calc Af Amer: >60 mL/min (ref >60)
GFR calc non Af Amer: >60 mL/min (ref >60)
GLUCOSE: 128 mg/dL — AB (ref 65–99)
Potassium: 3.3 mmol/L — ABNORMAL LOW (ref 3.5–5.1)
SODIUM: 132 mmol/L — AB (ref 135–145)

## 2015-03-10 LAB — URINE MICROSCOPIC-ADD ON

## 2015-03-10 MED ORDER — AZITHROMYCIN 250 MG PO TABS
1000.0000 mg | ORAL_TABLET | Freq: Once | ORAL | Status: AC
  Administered 2015-03-10: 1000 mg via ORAL
  Filled 2015-03-10: qty 4

## 2015-03-10 MED ORDER — MORPHINE SULFATE (PF) 4 MG/ML IV SOLN
4.0000 mg | Freq: Once | INTRAVENOUS | Status: AC
  Administered 2015-03-10: 4 mg via INTRAVENOUS
  Filled 2015-03-10: qty 1

## 2015-03-10 MED ORDER — CEFTRIAXONE SODIUM 250 MG IJ SOLR
250.0000 mg | Freq: Once | INTRAMUSCULAR | Status: AC
  Administered 2015-03-10: 250 mg via INTRAMUSCULAR
  Filled 2015-03-10: qty 250

## 2015-03-10 MED ORDER — DOXYCYCLINE HYCLATE 100 MG PO CAPS: 100.0000 mg | ORAL_CAPSULE | Freq: Two times a day (BID) | ORAL | Status: DC

## 2015-03-10 MED ORDER — ONDANSETRON HCL 4 MG/2ML IJ SOLN
4.0000 mg | Freq: Once | INTRAMUSCULAR | Status: AC
  Administered 2015-03-10: 4 mg via INTRAVENOUS
  Filled 2015-03-10: qty 2

## 2015-03-10 NOTE — ED Notes (Signed)
Patient transported to Ultrasound 
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
## 2015-03-10 NOTE — ED Notes (Signed)
Pt requested pain meds.  Pa at bedside.

## 2015-03-10 NOTE — Discharge Instructions (Signed)
Orchitis Orchitis is swelling (inflammation) of a testicle caused by infection. Testicles are the male organs that produce sperm. The testicles are held in a fleshy sac (scrotum) located behind the penis. Orchitis usually affects only one testicle, but it can occur in both. The condition can develop suddenly. Orchitis can be caused by many different kinds of bacteria and viruses. CAUSES Orchitis can be caused by either a bacterial or viral infection. Bacterial Infections  These often occur along with an infection of the coiled tube that collects sperm and sits on top of the testicle (epididymis).  In men who are not sexually active, bacterial orchitis usually starts as a urinary tract infection and spreads to the testicle.  In sexually active men, sexually transmitted infections are the most common cause of bacterial orchitis. These can include:  Gonorrhea.  Chlamydia. Viral Infections  Mumps is still the most common cause of viral orchitis, though mumps is now rare in many areas because of vaccination.  Other viruses that can cause orchitis include:  The chickenpox virus (varicella-zoster virus).  The virus that causes mononucleosis (Epstein-Barr virus). RISK FACTORS Boys and men who have not been vaccinated against mumps are at risk for mumps orchitis. Risk factors for bacterial orchitis include:  Frequent urinary tract infections.  High-risk sexual behaviors.  Having a sexual partner with a sexually transmitted infection.  Having had urinary tract surgery.  Using a tube passed through the penis to drain urine (Foley catheter).  An enlarged prostate gland. SIGNS AND SYMPTOMS The most common symptoms of orchitis are swelling and pain in the scrotum. Other signs and symptoms may include:  Feeling generally sick (malaise).  Fever and chills.  Painful urination.  Painful ejaculation.  Blood or discharge from the  penis.  Nausea.  Headache.  Fatigue. DIAGNOSIS Your health care provider may suspect orchitis if you have a painful, swollen testicle along with other signs and symptoms of the condition. A physical exam will be done. Tests may also be done to help your health care provider make a diagnosis. These may include:  A blood test to check for signs of infection.  A urine test to check for a urinary tract infection.  Using a swab to collect a fluid sample from the tip of the penis to test for sexually transmitted infections.  Taking an image of the testicle using sound waves and a computer (testicular ultrasound). TREATMENT Treatment of orchitis depends on the cause. For orchitis caused by a bacterial infection, your health care provider will most likely prescribe antibiotic medicines. Bacterial infections usually clear up within a few days. Both viral infections and bacterial infections may be treated with:  Bed rest.  Anti-inflammatory medicines.  Pain medicines.  Elevating the scrotum and applying ice. HOME CARE INSTRUCTIONS  Rest as directed by your health care provider.  Take medicines only as directed by your health care provider.  If you were prescribed an antibiotic medicine, finish it all even if you start to feel better.  Elevate your scrotum and apply ice as directed:  Put ice in a plastic bag.  Place a small towel or pillow between your legs.  Rest your scrotum on the pillow or towel.  Place another towel between your skin and the plastic bag.  Leave the ice on for 20 minutes, 2-3 times a day. SEEK MEDICAL CARE IF:  You have a fever.  Pain and swelling have not gotten better after 3 days. SEEK IMMEDIATE MEDICAL CARE IF:  Your pain is getting  worse.  The swelling in your testicle gets worse.   This information is not intended to replace advice given to you by your health care provider. Make sure you discuss any questions you have with your health care  provider.  Epididymitis Epididymitis is swelling (inflammation) of the epididymis. The epididymis is a cord-like structure that is located along the top and back part of the testicle. It collects and stores sperm from the testicle. This condition can also cause pain and swelling of the testicle and scrotum. Symptoms usually start suddenly (acute epididymitis). Sometimes epididymitis starts gradually and lasts for a while (chronic epididymitis). This type may be harder to treat. CAUSES In men 9 and younger, this condition is usually caused by a bacterial infection or sexually transmitted disease (STD), such as:  Gonorrhea.  Chlamydia.  In men 77 and older who do not have anal sex, this condition is usually caused by bacteria from a blockage or abnormalities in the urinary system. These can result from:  Having a tube placed into the bladder (urinary catheter).  Having an enlarged or inflamed prostate gland.  Having recent urinary tract surgery. In men who have a condition that weakens the body's defense system (immune system), such as HIV, this condition can be caused by:   Other bacteria, including tuberculosis and syphilis.  Viruses.  Fungi. Sometimes this condition occurs without infection. That may happen if urine flows backward into the epididymis after heavy lifting or straining. RISK FACTORS This condition is more likely to develop in men:  Who have unprotected sex with more than one partner.  Who have anal sex.   Who have recently had surgery.   Who have a urinary catheter.  Who have urinary problems.  Who have a suppressed immune system. SYMPTOMS  This condition usually begins suddenly with chills, fever, and pain behind the scrotum and in the testicle. Other symptoms include:   Swelling of the scrotum, testicle, or both.  Pain whenejaculatingor urinating.  Pain in the back or belly.  Nausea.  Itching and discharge from the penis.  Frequent need to  pass urine.  Redness and tenderness of the scrotum. DIAGNOSIS Your health care provider can diagnose this condition based on your symptoms and medical history. Your health care provider will also do a physical exam to ask about your symptoms and check your scrotum and testicle for swelling, pain, and redness. You may also have other tests, including:   Examination of discharge from the penis.  Urine tests for infections, such as STDs.  Your health care provider may test you for other STDs, including HIV. TREATMENT Treatment for this condition depends on the cause. If your condition is caused by a bacterial infection, oral antibiotic medicine may be prescribed. If the bacterial infection has spread to your blood, you may need to receive IV antibiotics. Nonbacterial epididymitis is treated with home care that includes bed rest and elevation of the scrotum. Surgery may be needed to treat:  Bacterial epididymitis that causes pus to build up in the scrotum (abscess).  Chronic epididymitis that has not responded to other treatments. HOME CARE INSTRUCTIONS Medicines  Take over-the-counter and prescription medicines only as told by your health care provider.   If you were prescribed an antibiotic medicine, take it as told by your health care provider. Do not stop taking the antibiotic even if your condition improves. Sexual Activity  If your epididymitis was caused by an STD, avoid sexual activity until your treatment is complete.  Inform your  sexual partner or partners if you test positive for an STD. They may need to be treated.Do not engage in sexual activity with your partner or partners until their treatment is completed. General Instructions  Return to your normal activities as told by your health care provider. Ask your health care provider what activities are safe for you.  Keep your scrotum elevated and supported while resting. Ask your health care provider if you should wear  a scrotal support, such as a jockstrap. Wear it as told by your health care provider.  If directed, apply ice to the affected area:   Put ice in a plastic bag.  Place a towel between your skin and the bag.  Leave the ice on for 20 minutes, 2-3 times per day.  Try taking a sitz bath to help with discomfort. This is a warm water bath that is taken while you are sitting down. The water should only come up to your hips and should cover your buttocks. Do this 3-4 times per day or as told by your health care provider.  Keep all follow-up visits as told by your health care provider. This is important. SEEK MEDICAL CARE IF:   You have a fever.   Your pain medicine is not helping.   Your pain is getting worse.   Your symptoms do not improve within three days.   This information is not intended to replace advice given to you by your health care provider. Make sure you discuss any questions you have with your health care provider.   Document Released: 04/26/2000 Document Revised: 01/18/2015 Document Reviewed: 09/14/2014 Elsevier Interactive Patient Education 2016 [employer] Inc[employer]    Document Released: 04/26/2000 Document Revised: 05/20/2014 Document Reviewed: 09/16/2013 Elsevier Interactive Patient Education [employer]

## 2015-03-10 NOTE — ED Notes (Signed)
Pt to ED with complaint of "groin swelling"; on assessment pt has swelling to left testicle. Denies fever or discharge from penis. A/O x4. VSS

## 2015-03-13 LAB — GC/CHLAMYDIA PROBE AMP (~~LOC~~) NOT AT ARMC
CHLAMYDIA, DNA PROBE: NEGATIVE
Neisseria Gonorrhea: NEGATIVE

## 2015-03-29 ENCOUNTER — Emergency Department (HOSPITAL_COMMUNITY): Payer: Medicare Other

## 2015-03-29 ENCOUNTER — Encounter (HOSPITAL_COMMUNITY): Payer: Self-pay | Admitting: Emergency Medicine

## 2015-03-29 ENCOUNTER — Emergency Department (HOSPITAL_COMMUNITY)
Admission: EM | Admit: 2015-03-29 | Discharge: 2015-03-29 | Disposition: A | Discharge status: Home / Self Care | Payer: Medicare Other | Attending: Emergency Medicine | Admitting: Emergency Medicine

## 2015-03-29 DIAGNOSIS — Z8719 Personal history of other diseases of the digestive system: Secondary | ICD-10-CM | POA: Insufficient documentation

## 2015-03-29 DIAGNOSIS — Z8701 Personal history of pneumonia (recurrent): Secondary | ICD-10-CM | POA: Insufficient documentation

## 2015-03-29 DIAGNOSIS — Z8669 Personal history of other diseases of the nervous system and sense organs: Secondary | ICD-10-CM | POA: Insufficient documentation

## 2015-03-29 DIAGNOSIS — N5089 Other specified disorders of the male genital organs: Secondary | ICD-10-CM | POA: Diagnosis not present

## 2015-03-29 DIAGNOSIS — Z862 Personal history of diseases of the blood and blood-forming organs and certain disorders involving the immune mechanism: Secondary | ICD-10-CM | POA: Insufficient documentation

## 2015-03-29 DIAGNOSIS — N453 Epididymo-orchitis: Secondary | ICD-10-CM | POA: Diagnosis not present

## 2015-03-29 DIAGNOSIS — I10 Essential (primary) hypertension: Secondary | ICD-10-CM | POA: Diagnosis not present

## 2015-03-29 DIAGNOSIS — F17200 Nicotine dependence, unspecified, uncomplicated: Secondary | ICD-10-CM | POA: Diagnosis not present

## 2015-03-29 DIAGNOSIS — M19072 Primary osteoarthritis, left ankle and foot: Secondary | ICD-10-CM | POA: Insufficient documentation

## 2015-03-29 DIAGNOSIS — N50819 Testicular pain, unspecified: Secondary | ICD-10-CM

## 2015-03-29 LAB — CBC
HCT: 33.1 % — ABNORMAL LOW (ref 39.0–52.0)
Hemoglobin: 11.5 g/dL — ABNORMAL LOW (ref 13.0–17.0)
MCH: 34.2 pg — ABNORMAL HIGH (ref 26.0–34.0)
MCHC: 34.7 g/dL (ref 30.0–36.0)
MCV: 98.5 fL (ref 78.0–100.0)
Platelets: 62 10*3/uL — ABNORMAL LOW (ref 150–400)
RBC: 3.36 MIL/uL — ABNORMAL LOW (ref 4.22–5.81)
RDW: 14.7 % (ref 11.5–15.5)
WBC: 4.6 10*3/uL (ref 4.0–10.5)

## 2015-03-29 LAB — URINALYSIS, ROUTINE W REFLEX MICROSCOPIC
BILIRUBIN URINE: NEGATIVE
GLUCOSE, UA: NEGATIVE mg/dL
HGB URINE DIPSTICK: NEGATIVE
Ketones, ur: NEGATIVE mg/dL
LEUKOCYTES UA: NEGATIVE
Nitrite: NEGATIVE
PH: 6.5 (ref 5.0–8.0)
PROTEIN: NEGATIVE mg/dL
Specific Gravity, Urine: 1.016 (ref 1.005–1.030)

## 2015-03-29 LAB — BASIC METABOLIC PANEL
Anion gap: 7 (ref 5–15)
BUN: 6 mg/dL (ref 6–20)
CALCIUM: 8.8 mg/dL — AB (ref 8.9–10.3)
CO2: 26 mmol/L (ref 22–32)
CREATININE: 1.07 mg/dL (ref 0.61–1.24)
Chloride: 105 mmol/L (ref 101–111)
GFR calc Af Amer: >60 mL/min (ref >60)
GFR calc non Af Amer: >60 mL/min (ref >60)
GLUCOSE: 100 mg/dL — AB (ref 65–99)
Potassium: 3.6 mmol/L (ref 3.5–5.1)
Sodium: 138 mmol/L (ref 135–145)

## 2015-03-29 MED ORDER — OXYCODONE-ACETAMINOPHEN 5-325 MG PO TABS: 1.0000 | ORAL_TABLET | Freq: Four times a day (QID) | ORAL | Status: DC | PRN

## 2015-03-29 MED ORDER — OXYCODONE-ACETAMINOPHEN 5-325 MG PO TABS
ORAL_TABLET | ORAL | Status: AC
  Filled 2015-03-29: qty 1

## 2015-03-29 MED ORDER — OXYCODONE-ACETAMINOPHEN 5-325 MG PO TABS
1.0000 | ORAL_TABLET | Freq: Once | ORAL | Status: AC
  Administered 2015-03-29: 1 via ORAL

## 2015-03-29 MED ORDER — OXYCODONE-ACETAMINOPHEN 5-325 MG PO TABS
1.0000 | ORAL_TABLET | Freq: Once | ORAL | Status: AC
  Administered 2015-03-29: 1 via ORAL
  Filled 2015-03-29: qty 1

## 2015-03-29 NOTE — ED Notes (Signed)
Discharge instructions/prescription reviewed with patient. Understanding verbalized. Patient declined wheelchair at time of discharge. Denies pain. No distress noted.

## 2015-03-29 NOTE — Discharge Instructions (Signed)
Please call the urologist TOMORROW to schedule a follow-up appointment. Your ultrasound shows some improvement from your prior exam. Your bloodwork was unremarkable.    Orchitis Orchitis is swelling (inflammation) of a testicle caused by infection. Testicles are the male organs that produce sperm. The testicles are held in a fleshy sac (scrotum) located behind the penis. Orchitis usually affects only one testicle, but it can occur in both. The condition can develop suddenly. Orchitis can be caused by many different kinds of bacteria and viruses. CAUSES Orchitis can be caused by either a bacterial or viral infection. Bacterial Infections  These often occur along with an infection of the coiled tube that collects sperm and sits on top of the testicle (epididymis).  In men who are not sexually active, bacterial orchitis usually starts as a urinary tract infection and spreads to the testicle.  In sexually active men, sexually transmitted infections are the most common cause of bacterial orchitis. These can include:  Gonorrhea.  Chlamydia. Viral Infections  Mumps is still the most common cause of viral orchitis, though mumps is now rare in many areas because of vaccination.  Other viruses that can cause orchitis include:  The chickenpox virus (varicella-zoster virus).  The virus that causes mononucleosis (Epstein-Barr virus). RISK FACTORS Boys and men who have not been vaccinated against mumps are at risk for mumps orchitis. Risk factors for bacterial orchitis include:  Frequent urinary tract infections.  High-risk sexual behaviors.  Having a sexual partner with a sexually transmitted infection.  Having had urinary tract surgery.  Using a tube passed through the penis to drain urine (Foley catheter).  An enlarged prostate gland. SIGNS AND SYMPTOMS The most common symptoms of orchitis are swelling and pain in the scrotum. Other signs and symptoms may include:  Feeling  generally sick (malaise).  Fever and chills.  Painful urination.  Painful ejaculation.  Blood or discharge from the penis.  Nausea.  Headache.  Fatigue. DIAGNOSIS Your health care provider may suspect orchitis if you have a painful, swollen testicle along with other signs and symptoms of the condition. A physical exam will be done. Tests may also be done to help your health care provider make a diagnosis. These may include:  A blood test to check for signs of infection.  A urine test to check for a urinary tract infection.  Using a swab to collect a fluid sample from the tip of the penis to test for sexually transmitted infections.  Taking an image of the testicle using sound waves and a computer (testicular ultrasound). TREATMENT Treatment of orchitis depends on the cause. For orchitis caused by a bacterial infection, your health care provider will most likely prescribe antibiotic medicines. Bacterial infections usually clear up within a few days. Both viral infections and bacterial infections may be treated with:  Bed rest.  Anti-inflammatory medicines.  Pain medicines.  Elevating the scrotum and applying ice. HOME CARE INSTRUCTIONS  Rest as directed by your health care provider.  Take medicines only as directed by your health care provider.  If you were prescribed an antibiotic medicine, finish it all even if you start to feel better.  Elevate your scrotum and apply ice as directed:  Put ice in a plastic bag.  Place a small towel or pillow between your legs.  Rest your scrotum on the pillow or towel.  Place another towel between your skin and the plastic bag.  Leave the ice on for 20 minutes, 2-3 times a day. SEEK MEDICAL CARE IF:  You have a fever.  Pain and swelling have not gotten better after 3 days. SEEK IMMEDIATE MEDICAL CARE IF:  Your pain is getting worse.  The swelling in your testicle gets worse.   This information is not intended to  replace advice given to you by your health care provider. Make sure you discuss any questions you have with your health care provider.   Document Released: 04/26/2000 Document Revised: 05/20/2014 Document Reviewed: 09/16/2013 Elsevier Interactive Patient Education [employer]  Please obtain all of your results from medical records or have your doctors office obtain the results - share them with your doctor - you should be seen at your doctors office in the next 2 days. Call today to arrange your follow up. Take the medications as prescribed. Please review all of the medicines and only take them if you do not have an allergy to them. Please be aware that if you are taking birth control pills, taking other prescriptions, ESPECIALLY ANTIBIOTICS may make the birth control ineffective - if this is the case, either do not engage in sexual activity or use alternative methods of birth control such as condoms until you have finished the medicine and your family doctor says it is OK to restart them. If you are on a blood thinner such as COUMADIN, be aware that any other medicine that you take may cause the coumadin to either work too much, or not enough - you should have your coumadin level rechecked in next 7 days if this is the case.  ?  It is also a possibility that you have an allergic reaction to any of the medicines that you have been prescribed - Everybody reacts differently to medications and while MOST people have no trouble with most medicines, you may have a reaction such as nausea, vomiting, rash, swelling, shortness of breath. If this is the case, please stop taking the medicine immediately and contact your physician.  ?  You should return to the ER if you develop severe or worsening symptoms.

## 2015-03-29 NOTE — ED Notes (Signed)
C/o left testicle swelling X 3 weeks, seen and treated for same 2 weeks, pain is worse this week,  No other complaints, finished abx, NAD
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
## 2015-03-30 LAB — HIV ANTIBODY (ROUTINE TESTING W REFLEX): HIV SCREEN 4TH GENERATION: NONREACTIVE

## 2015-03-30 LAB — RPR: RPR Ser Ql: NONREACTIVE

## 2016-08-02 ENCOUNTER — Emergency Department (HOSPITAL_COMMUNITY): Payer: Medicare Other

## 2016-08-02 ENCOUNTER — Inpatient Hospital Stay (HOSPITAL_COMMUNITY)
Admission: EM | Admit: 2016-08-02 | Discharge: 2016-08-09 | DRG: 987 | Disposition: A | Discharge status: Home / Self Care | Payer: Medicare Other | Attending: General Surgery | Admitting: General Surgery

## 2016-08-02 ENCOUNTER — Encounter (HOSPITAL_COMMUNITY): Payer: Self-pay | Admitting: Emergency Medicine

## 2016-08-02 DIAGNOSIS — R0902 Hypoxemia: Secondary | ICD-10-CM | POA: Diagnosis present

## 2016-08-02 DIAGNOSIS — R1110 Vomiting, unspecified: Secondary | ICD-10-CM | POA: Diagnosis present

## 2016-08-02 DIAGNOSIS — R579 Shock, unspecified: Secondary | ICD-10-CM | POA: Diagnosis not present

## 2016-08-02 DIAGNOSIS — D735 Infarction of spleen: Secondary | ICD-10-CM | POA: Diagnosis not present

## 2016-08-02 DIAGNOSIS — S36039A Unspecified laceration of spleen, initial encounter: Secondary | ICD-10-CM | POA: Diagnosis not present

## 2016-08-02 DIAGNOSIS — Z7982 Long term (current) use of aspirin: Secondary | ICD-10-CM

## 2016-08-02 DIAGNOSIS — I959 Hypotension, unspecified: Secondary | ICD-10-CM | POA: Diagnosis not present

## 2016-08-02 DIAGNOSIS — Z8781 Personal history of (healed) traumatic fracture: Secondary | ICD-10-CM

## 2016-08-02 DIAGNOSIS — Y92019 Unspecified place in single-family (private) house as the place of occurrence of the external cause: Secondary | ICD-10-CM | POA: Diagnosis not present

## 2016-08-02 DIAGNOSIS — R578 Other shock: Secondary | ICD-10-CM | POA: Diagnosis present

## 2016-08-02 DIAGNOSIS — D619 Aplastic anemia, unspecified: Secondary | ICD-10-CM | POA: Diagnosis present

## 2016-08-02 DIAGNOSIS — S3600XA Unspecified injury of spleen, initial encounter: Secondary | ICD-10-CM

## 2016-08-02 DIAGNOSIS — K759 Inflammatory liver disease, unspecified: Secondary | ICD-10-CM | POA: Diagnosis present

## 2016-08-02 DIAGNOSIS — F17200 Nicotine dependence, unspecified, uncomplicated: Secondary | ICD-10-CM | POA: Diagnosis present

## 2016-08-02 DIAGNOSIS — T794XXA Traumatic shock, initial encounter: Secondary | ICD-10-CM | POA: Diagnosis not present

## 2016-08-02 DIAGNOSIS — R079 Chest pain, unspecified: Secondary | ICD-10-CM | POA: Diagnosis not present

## 2016-08-02 DIAGNOSIS — Z91018 Allergy to other foods: Secondary | ICD-10-CM

## 2016-08-02 DIAGNOSIS — R1084 Generalized abdominal pain: Secondary | ICD-10-CM | POA: Diagnosis not present

## 2016-08-02 DIAGNOSIS — D696 Thrombocytopenia, unspecified: Secondary | ICD-10-CM | POA: Diagnosis present

## 2016-08-02 DIAGNOSIS — I10 Essential (primary) hypertension: Secondary | ICD-10-CM | POA: Diagnosis present

## 2016-08-02 DIAGNOSIS — R0602 Shortness of breath: Secondary | ICD-10-CM | POA: Diagnosis not present

## 2016-08-02 DIAGNOSIS — M79604 Pain in right leg: Secondary | ICD-10-CM | POA: Diagnosis not present

## 2016-08-02 DIAGNOSIS — Z91012 Allergy to eggs: Secondary | ICD-10-CM | POA: Diagnosis not present

## 2016-08-02 DIAGNOSIS — R42 Dizziness and giddiness: Secondary | ICD-10-CM | POA: Diagnosis not present

## 2016-08-02 DIAGNOSIS — Z79891 Long term (current) use of opiate analgesic: Secondary | ICD-10-CM | POA: Diagnosis not present

## 2016-08-02 DIAGNOSIS — R571 Hypovolemic shock: Secondary | ICD-10-CM

## 2016-08-02 DIAGNOSIS — E861 Hypovolemia: Secondary | ICD-10-CM | POA: Diagnosis present

## 2016-08-02 DIAGNOSIS — I872 Venous insufficiency (chronic) (peripheral): Secondary | ICD-10-CM | POA: Diagnosis present

## 2016-08-02 DIAGNOSIS — S36032A Major laceration of spleen, initial encounter: Principal | ICD-10-CM | POA: Diagnosis present

## 2016-08-02 DIAGNOSIS — G4730 Sleep apnea, unspecified: Secondary | ICD-10-CM | POA: Diagnosis present

## 2016-08-02 DIAGNOSIS — D62 Acute posthemorrhagic anemia: Secondary | ICD-10-CM | POA: Diagnosis present

## 2016-08-02 DIAGNOSIS — R58 Hemorrhage, not elsewhere classified: Secondary | ICD-10-CM

## 2016-08-02 DIAGNOSIS — M19072 Primary osteoarthritis, left ankle and foot: Secondary | ICD-10-CM | POA: Diagnosis present

## 2016-08-02 HISTORY — PX: IR GENERIC HISTORICAL: IMG1180011

## 2016-08-02 LAB — CBC
HCT: 28.9 % — ABNORMAL LOW (ref 39.0–52.0)
Hemoglobin: 9.8 g/dL — ABNORMAL LOW (ref 13.0–17.0)
MCH: 32 pg (ref 26.0–34.0)
MCHC: 33.9 g/dL (ref 30.0–36.0)
MCV: 94.4 fL (ref 78.0–100.0)
PLATELETS: 102 10*3/uL — AB (ref 150–400)
RBC: 3.06 MIL/uL — ABNORMAL LOW (ref 4.22–5.81)
RDW: 18.8 % — AB (ref 11.5–15.5)
WBC: 6.1 10*3/uL (ref 4.0–10.5)

## 2016-08-02 LAB — CBC WITH DIFFERENTIAL/PLATELET
Basophils Absolute: 0 10*3/uL (ref 0.0–0.1)
Basophils Relative: 0 %
EOS ABS: 0 10*3/uL (ref 0.0–0.7)
Eosinophils Relative: 0 %
HEMATOCRIT: 25 % — AB (ref 39.0–52.0)
HEMOGLOBIN: 8.3 g/dL — AB (ref 13.0–17.0)
LYMPHS PCT: 32 %
Lymphs Abs: 1.6 10*3/uL (ref 0.7–4.0)
MCH: 33.2 pg (ref 26.0–34.0)
MCHC: 33.2 g/dL (ref 30.0–36.0)
MCV: 100 fL (ref 78.0–100.0)
MONOS PCT: 4 %
Monocytes Absolute: 0.2 10*3/uL (ref 0.1–1.0)
NEUTROS PCT: 64 %
Neutro Abs: 3.1 10*3/uL (ref 1.7–7.7)
Platelets: 31 10*3/uL — ABNORMAL LOW (ref 150–400)
RBC: 2.5 MIL/uL — AB (ref 4.22–5.81)
RDW: 15.9 % — ABNORMAL HIGH (ref 11.5–15.5)
WBC: 4.9 10*3/uL (ref 4.0–10.5)

## 2016-08-02 LAB — COMPREHENSIVE METABOLIC PANEL
ALBUMIN: 2.1 g/dL — AB (ref 3.5–5.0)
ALT: 19 U/L (ref 17–63)
ANION GAP: 7 (ref 5–15)
AST: 32 U/L (ref 15–41)
Alkaline Phosphatase: 43 U/L (ref 38–126)
BILIRUBIN TOTAL: 0.7 mg/dL (ref 0.3–1.2)
BUN: 22 mg/dL — AB (ref 6–20)
CHLORIDE: 107 mmol/L (ref 101–111)
CO2: 22 mmol/L (ref 22–32)
CREATININE: 1.99 mg/dL — AB (ref 0.61–1.24)
Calcium: 7.3 mg/dL — ABNORMAL LOW (ref 8.9–10.3)
GFR, EST AFRICAN AMERICAN: 45 mL/min — AB (ref >60)
GFR, EST NON AFRICAN AMERICAN: 39 mL/min — AB (ref >60)
GLUCOSE: 163 mg/dL — AB (ref 65–99)
POTASSIUM: 4 mmol/L (ref 3.5–5.1)
Sodium: 136 mmol/L (ref 135–145)
Total Protein: 4.3 g/dL — ABNORMAL LOW (ref 6.5–8.1)

## 2016-08-02 LAB — RAPID URINE DRUG SCREEN, HOSP PERFORMED
AMPHETAMINES: NOT DETECTED
BENZODIAZEPINES: NOT DETECTED
Barbiturates: NOT DETECTED
Cocaine: POSITIVE — AB
OPIATES: NOT DETECTED
TETRAHYDROCANNABINOL: NOT DETECTED

## 2016-08-02 LAB — I-STAT ARTERIAL BLOOD GAS, ED
ACID-BASE DEFICIT: 4 mmol/L — AB (ref 0.0–2.0)
Bicarbonate: 21.9 mmol/L (ref 20.0–28.0)
O2 SAT: 100 %
PCO2 ART: 41.3 mmHg (ref 32.0–48.0)
PH ART: 7.325 — AB (ref 7.350–7.450)
Patient temperature: 96
TCO2: 23 mmol/L (ref 0–100)
pO2, Arterial: 469 mmHg — ABNORMAL HIGH (ref 83.0–108.0)

## 2016-08-02 LAB — PREPARE RBC (CROSSMATCH)

## 2016-08-02 LAB — PROTIME-INR
INR: 1.53
Prothrombin Time: 18.5 seconds — ABNORMAL HIGH (ref 11.4–15.2)

## 2016-08-02 LAB — I-STAT CHEM 8, ED
BUN: 31 mg/dL — AB (ref 6–20)
CHLORIDE: 110 mmol/L (ref 101–111)
CREATININE: 1.8 mg/dL — AB (ref 0.61–1.24)
Calcium, Ion: 0.93 mmol/L — ABNORMAL LOW (ref 1.15–1.40)
GLUCOSE: 160 mg/dL — AB (ref 65–99)
HEMATOCRIT: 22 % — AB (ref 39.0–52.0)
HEMOGLOBIN: 7.5 g/dL — AB (ref 13.0–17.0)
POTASSIUM: 5.7 mmol/L — AB (ref 3.5–5.1)
Sodium: 135 mmol/L (ref 135–145)
TCO2: 22 mmol/L (ref 0–100)

## 2016-08-02 LAB — I-STAT CG4 LACTIC ACID, ED: LACTIC ACID, VENOUS: 2.49 mmol/L — AB (ref 0.5–1.9)

## 2016-08-02 LAB — MRSA PCR SCREENING: MRSA BY PCR: NEGATIVE

## 2016-08-02 LAB — BRAIN NATRIURETIC PEPTIDE: B Natriuretic Peptide: 23.7 pg/mL (ref 0.0–100.0)

## 2016-08-02 LAB — CBG MONITORING, ED: Glucose-Capillary: 128 mg/dL — ABNORMAL HIGH (ref 65–99)

## 2016-08-02 LAB — ABO/RH: ABO/RH(D): A POS

## 2016-08-02 LAB — I-STAT TROPONIN, ED: Troponin i, poc: 0 ng/mL (ref 0.00–0.08)

## 2016-08-02 LAB — TROPONIN I: Troponin I: <0.03 ng/mL (ref <0.03)

## 2016-08-02 LAB — ETHANOL

## 2016-08-02 MED ORDER — IOPAMIDOL (ISOVUE-370) INJECTION 76%
INTRAVENOUS | Status: AC
  Administered 2016-08-02: 100 mL via INTRAVENOUS
  Filled 2016-08-02: qty 100

## 2016-08-02 MED ORDER — IOPAMIDOL (ISOVUE-300) INJECTION 61%
INTRAVENOUS | Status: AC
  Administered 2016-08-02: 1 mL
  Filled 2016-08-02: qty 100

## 2016-08-02 MED ORDER — HYDRALAZINE HCL 20 MG/ML IJ SOLN
5.0000 mg | INTRAMUSCULAR | Status: DC | PRN
  Administered 2016-08-02 – 2016-08-09 (×5): 5 mg via INTRAVENOUS
  Filled 2016-08-02 (×5): qty 1

## 2016-08-02 MED ORDER — PANTOPRAZOLE SODIUM 40 MG IV SOLR
40.0000 mg | Freq: Every day | INTRAVENOUS | Status: DC
  Administered 2016-08-02: 40 mg via INTRAVENOUS
  Filled 2016-08-02: qty 40

## 2016-08-02 MED ORDER — MIDAZOLAM HCL 2 MG/2ML IJ SOLN
INTRAMUSCULAR | Status: AC | PRN
  Administered 2016-08-02: 1 mg via INTRAVENOUS

## 2016-08-02 MED ORDER — PANTOPRAZOLE SODIUM 40 MG PO TBEC
40.0000 mg | DELAYED_RELEASE_TABLET | Freq: Every day | ORAL | Status: DC
  Administered 2016-08-03 – 2016-08-09 (×7): 40 mg via ORAL
  Filled 2016-08-02 (×7): qty 1

## 2016-08-02 MED ORDER — FENTANYL CITRATE (PF) 100 MCG/2ML IJ SOLN
INTRAMUSCULAR | Status: AC
  Filled 2016-08-02: qty 2

## 2016-08-02 MED ORDER — KCL IN DEXTROSE-NACL 20-5-0.45 MEQ/L-%-% IV SOLN
INTRAVENOUS | Status: DC
  Administered 2016-08-02 – 2016-08-04 (×5): via INTRAVENOUS
  Filled 2016-08-02 (×5): qty 1000

## 2016-08-02 MED ORDER — SODIUM CHLORIDE 0.9 % IV SOLN
10.0000 mL/h | Freq: Once | INTRAVENOUS | Status: AC
  Administered 2016-08-02: 10 mL/h via INTRAVENOUS

## 2016-08-02 MED ORDER — ONDANSETRON HCL 4 MG PO TABS
4.0000 mg | ORAL_TABLET | Freq: Four times a day (QID) | ORAL | Status: DC | PRN
  Administered 2016-08-03 – 2016-08-08 (×2): 4 mg via ORAL
  Filled 2016-08-02 (×2): qty 1

## 2016-08-02 MED ORDER — FENTANYL CITRATE (PF) 100 MCG/2ML IJ SOLN
INTRAMUSCULAR | Status: AC | PRN
  Administered 2016-08-02: 50 ug via INTRAVENOUS

## 2016-08-02 MED ORDER — LIDOCAINE HCL 1 % IJ SOLN
INTRAMUSCULAR | Status: AC | PRN
  Administered 2016-08-02: 10 mL

## 2016-08-02 MED ORDER — CALCIUM CARBONATE ANTACID 500 MG PO CHEW
1.0000 | CHEWABLE_TABLET | Freq: Once | ORAL | Status: AC
Start: 2016-08-02 — End: 2016-08-02
  Administered 2016-08-02: 200 mg via ORAL
  Filled 2016-08-02: qty 1

## 2016-08-02 MED ORDER — OXYCODONE HCL 5 MG PO TABS
2.5000 mg | ORAL_TABLET | ORAL | Status: DC | PRN
  Administered 2016-08-02 – 2016-08-03 (×2): 2.5 mg via ORAL
  Filled 2016-08-02 (×2): qty 1

## 2016-08-02 MED ORDER — ONDANSETRON HCL 4 MG/2ML IJ SOLN
4.0000 mg | Freq: Four times a day (QID) | INTRAMUSCULAR | Status: DC | PRN
  Administered 2016-08-02 – 2016-08-07 (×3): 4 mg via INTRAVENOUS
  Filled 2016-08-02 (×3): qty 2

## 2016-08-02 MED ORDER — MIDAZOLAM HCL 2 MG/2ML IJ SOLN
INTRAMUSCULAR | Status: AC
  Filled 2016-08-02: qty 2

## 2016-08-02 MED ORDER — SODIUM CHLORIDE 0.9 % IV BOLUS (SEPSIS)
1000.0000 mL | Freq: Once | INTRAVENOUS | Status: AC
  Administered 2016-08-02: 1000 mL via INTRAVENOUS

## 2016-08-02 MED ORDER — LIDOCAINE HCL (PF) 1 % IJ SOLN
INTRAMUSCULAR | Status: AC
  Filled 2016-08-02: qty 30

## 2016-08-02 MED ORDER — HYDROMORPHONE HCL 1 MG/ML IJ SOLN
1.0000 mg | INTRAMUSCULAR | Status: DC | PRN
  Administered 2016-08-02 – 2016-08-03 (×6): 1 mg via INTRAVENOUS
  Administered 2016-08-03: 2 mg via INTRAVENOUS
  Administered 2016-08-04 (×4): 1 mg via INTRAVENOUS
  Administered 2016-08-04: 2 mg via INTRAVENOUS
  Administered 2016-08-05 (×3): 1 mg via INTRAVENOUS
  Filled 2016-08-02: qty 1
  Filled 2016-08-02: qty 2
  Filled 2016-08-02 (×3): qty 1
  Filled 2016-08-02: qty 2
  Filled 2016-08-02 (×7): qty 1
  Filled 2016-08-02: qty 2
  Filled 2016-08-02: qty 1

## 2016-08-02 MED ORDER — IOPAMIDOL (ISOVUE-300) INJECTION 61%
INTRAVENOUS | Status: AC
  Administered 2016-08-02: 75 mL
  Filled 2016-08-02: qty 150

## 2016-08-02 MED ORDER — SODIUM CHLORIDE 0.9 % IV SOLN
Freq: Once | INTRAVENOUS | Status: AC
  Administered 2016-08-02: 08:00:00 via INTRAVENOUS

## 2016-08-02 NOTE — Sedation Documentation (Signed)
Pt brought to IR#1 from ED. Awake and alert. In no distress. Dr. Archer Asa at bedside explaining procedure, consent signed.

## 2016-08-02 NOTE — Sedation Documentation (Signed)
Pt becoming agitated due to back pain. Explained he needs to stay on table on his back and will get pain meds as soon as ready to start procedure. Pt calming down.

## 2016-08-02 NOTE — ED Notes (Signed)
All personal belongings given to nice, PA Holley Raring [place])

## 2016-08-02 NOTE — Sedation Documentation (Signed)
1 unit FFP started.

## 2016-08-02 NOTE — ED Notes (Signed)
First unit of Blood [id number] started infusing.

## 2016-08-02 NOTE — Sedation Documentation (Signed)
5 Fr. Exoseal to right groin 
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
## 2016-08-02 NOTE — Progress Notes (Deleted)
GS Progress Note Subjective: Patient much more alert and calm today.  Responsive.  No acute distress.   Objective: Vital signs in last 24 hours: Temp:  [96 F (35.6 C)-97.9 F (36.6 C)] 97.9 F (36.6 C) (03/23 0815) Pulse Rate:  [60-82] 79 (03/23 0830) Resp:  [12-37] 13 (03/23 0830) BP: (103-143)/(68-89) 131/88 (03/23 0830) SpO2:  [90 %-100 %] 100 % (03/23 0830) Weight:  [74.4 kg (164 lb 0.4 oz)] 74.4 kg (164 lb 0.4 oz) (03/23 0501)    Intake/Output from previous day: 03/22 0701 - 03/23 0700 In: 3005 [I.V.:1000; Blood:1005; IV Piggyback:1000] Out: 200 [Urine:200] Intake/Output this shift: No intake/output data recorded.  Lungs: Clear, but lots of secretions.  Abd: Soft, good bowel sounds.  1200 cc output through ileostomy yesterday.   Extremities: No changes  Neuro: Intact and more calm.  Lab Results: CBC   Recent Labs  08/02/16 0556 08/02/16 0606  WBC 4.9  --   HGB 8.3* 7.5*  HCT 25.0* 22.0*  PLT 31*  --    BMET  Recent Labs  08/02/16 0556 08/02/16 0606  NA 136 135  K 4.0 5.7*  CL 107 110  CO2 22  --   GLUCOSE 163* 160*  BUN 22* 31*  CREATININE 1.99* 1.80*  CALCIUM 7.3*  --    PT/INR  Recent Labs  08/02/16 0556  LABPROT 18.5*  INR 1.53   ABG  Recent Labs  08/02/16 0618  PHART 7.325*  HCO3 21.9    Studies/Results: Dg Chest Portable 1 View  Result Date: 08/02/2016 CLINICAL DATA:  46 year old male with shortness of breath and low O2 sats. EXAM: PORTABLE CHEST 1 VIEW COMPARISON:  Chest radiograph dated 10/17/2014 FINDINGS: There is mild cardiomegaly. No vascular congestion or edema. The lungs are clear. There is no pleural effusion or pneumothorax. There is apparent elevation of the right clavicle with increased acromioclavicular distance likely representing a degree of AC joint separation injury, age indeterminate, but new compared to the prior radiograph of 2016. Correlation with clinical exam recommended. Multiple faint radiopaque nodular  densities in the region of the right shoulder may represent loose bodies. Dedicated radiographs of the right shoulder may provide better evaluation if clinically indicated. IMPRESSION: 1. No acute cardiopulmonary process.  Mild cardiomegaly 2. Elevation of the right clavicle likely representing an [facility] joint separation injury, age indeterminate, possibly chronic. Multiple loose bodies may be present in the right shoulder. Correlation with clinical exam recommended. Dedicated radiographs of the right shoulder may provide better evaluation if clinically indicated. Electronically Signed   By: Elgie Collard M.D.   On: 08/02/2016 05:32   Ct Angio Chest/abd/pel For Dissection W And/or W/wo  Result Date: 08/02/2016 CLINICAL DATA:  46 year old male with acute abdominal, chest pain. EXAM: CT ANGIOGRAPHY CHEST, ABDOMEN AND PELVIS TECHNIQUE: Multidetector CT imaging through the chest, abdomen and pelvis was performed using the standard protocol during bolus administration of intravenous contrast. Multiplanar reconstructed images and MIPs were obtained and reviewed to evaluate the vascular anatomy. CONTRAST:  100 cc Isovue 370 COMPARISON:  None. FINDINGS: Evaluation is very limited due to streak artifact caused by patient's arms as well as streak artifact caused by metallic support wires. CTA CHEST FINDINGS Cardiovascular: There is no cardiomegaly or pericardial effusion. The thoracic aorta appears unremarkable. No aneurysmal dilatation or evidence of dissection. The origins of the great vessels of the aortic arch appear patent. The central pulmonary arteries are grossly unremarkable for the degree of enhancement. Mediastinum/Nodes: There is no hilar or mediastinal adenopathy.  The esophagus is grossly unremarkable. Lungs/Pleura: There is a 6 mm hazy subpleural nodular density in the right lower lobe (series 606, image 69). The lungs are otherwise clear. There is no pleural effusion or pneumothorax. The central airways are  patent. Musculoskeletal: No acute osseous pathology. For ectopic bone formation noted along the lateral aspect of the right clavicle. Review of the MIP images confirms the above findings. CTA ABDOMEN AND PELVIS FINDINGS VASCULAR Aorta: No aneurysmal dilatation or evidence of dissection. There is irregularity of the wall of the aorta, likely related to noncalcified plaque. There is 2.3 cm ectasia of infrarenal abdominal aorta. Celiac: The celiac axis is somewhat narrowed at the origin but appears patent. SMA: The SMA is patent.  No aneurysm or dissection Renals: The renal arteries are patent. IMA: The IMA is patent. Inflow: Patent without evidence of aneurysm, dissection, vasculitis or significant stenosis. Veins: The IVC is collapsed and not well visualized. Evaluation of the venous system is very limited on this arterial phase imaging. Review of the MIP images confirms the above findings. NON-VASCULAR There is no intra-abdominal free air. Large amount of high attenuating fluid within the peritoneal space compatible with hemoperitoneum. Blood is also noted along the paracolic gutter. Hepatobiliary: No focal liver abnormality is seen. No gallstones, gallbladder wall thickening, or biliary dilatation. Pancreas: Unremarkable. No pancreatic ductal dilatation or surrounding inflammatory changes. Spleen: There is apparent laceration of the spleen with extension of the laceration from the splenic capsule into the splenic hilum. There is extravasation of intravenous contrast from the lateral aspect of the spleen consistent with active arterial bleed. Adrenals/Urinary Tract: The adrenal glands are poorly visualized. There is focal area of hypoenhancement of the superior pole of the left kidney most consistent with a small infarct. The right kidney is unremarkable. The urinary bladder is grossly unremarkable. Stomach/Bowel: There is moderate stool within the colon. No evidence of bowel obstruction or active inflammation.  Normal appendix. Lymphatic: No definite adenopathy. Reproductive: The prostate and seminal vesicles are grossly unremarkable as visualized. Other: Mild diffuse subcutaneous stranding. Musculoskeletal: Degenerative changes of the spine. No acute fracture. Review of the MIP images confirms the above findings. IMPRESSION: 1. Splenic lacerations extending from the capsular region into the splenic hilum. There is extravasation of contrast from the distal portion of the splenic artery consistent with active bleed. 2. Large pneumoperitoneum. 3. No aortic aneurysm or dissection 4. Small left renal upper pole focal infarct. These results were called by telephone at the time of interpretation on 08/02/2016 at 6:05 am to Dr. Pricilla Loveless , who verbally acknowledged these results. Electronically Signed   By: Elgie Collard M.D.   On: 08/02/2016 06:21    Anti-infectives: Anti-infectives    None      Assessment/Plan: s/p  Wean to trach collar  Possible transfer to [facility] today.  His renal function appears to have stabilized and his creatinine is better, although his BUN is increased.    LOS: 0 days    Marta Lamas. Gae Bon, MD, FACS [phone] [facility] Surgery 08/02/2016

## 2016-08-02 NOTE — Procedures (Signed)
Interventional Radiology Procedure Note  Procedure: Splenic angiogram and embolization of actively bleeding branches.   Complications: None  Estimated Blood Loss: None  Recommendations: - Bedrest x 4 hrs - Follow H&H and transfuse as needed - Can do repeat angiogram if needed.    Signed,  Sterling Big, MD

## 2016-08-02 NOTE — Care Management Note (Signed)
Case Management Note  Patient Details  Name: Charles [name] MRN: [id number] Date of Birth: May 01, 1971  Subjective/Objective:   Pt admitted on 08/02/16 with splenic laceration.  PTA, pt independent of ADLS.                   Action/Plan: Will follow for discharge planning as pt progresses.    Expected Discharge Date:                  Expected Discharge Plan:  Home/Self Care  In-House Referral:  Clinical Social Work  Discharge planning Services  CM Consult  Post Acute Care Choice:    Choice offered to:     DME Arranged:    DME Agency:     HH Arranged:    HH Agency:     Status of Service:  In process, will continue to follow  If discussed at Long Length of Stay Meetings, dates discussed:    Additional Comments:  Glennon Mac, RN 08/02/2016, 4:54 PM

## 2016-08-02 NOTE — Sedation Documentation (Signed)
I unit platelets started.

## 2016-08-02 NOTE — ED Triage Notes (Signed)
Pt brought to Ed by [facility] from home for Abd pain, AMS and vomiting, BP 78/40, HR 78, SPO2 50-90% on NRM. CBG 262.

## 2016-08-02 NOTE — Sedation Documentation (Signed)
Pt transferred to 3M12. Pulses and right groin dressing checked.
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
## 2016-08-03 LAB — BASIC METABOLIC PANEL
Anion gap: 6 (ref 5–15)
BUN: 12 mg/dL (ref 6–20)
CALCIUM: 8.4 mg/dL — AB (ref 8.9–10.3)
CO2: 25 mmol/L (ref 22–32)
CREATININE: 1.16 mg/dL (ref 0.61–1.24)
Chloride: 103 mmol/L (ref 101–111)
GFR calc Af Amer: >60 mL/min (ref >60)
GLUCOSE: 123 mg/dL — AB (ref 65–99)
Potassium: 4.2 mmol/L (ref 3.5–5.1)
SODIUM: 134 mmol/L — AB (ref 135–145)

## 2016-08-03 LAB — CBC
HCT: 24.8 % — ABNORMAL LOW (ref 39.0–52.0)
HEMATOCRIT: 24.3 % — AB (ref 39.0–52.0)
Hemoglobin: 8.4 g/dL — ABNORMAL LOW (ref 13.0–17.0)
Hemoglobin: 8.1 g/dL — ABNORMAL LOW (ref 13.0–17.0)
MCH: 31.7 pg (ref 26.0–34.0)
MCH: 31.9 pg (ref 26.0–34.0)
MCHC: 33.9 g/dL (ref 30.0–36.0)
MCHC: 33.3 g/dL (ref 30.0–36.0)
MCV: 93.6 fL (ref 78.0–100.0)
MCV: 95.7 fL (ref 78.0–100.0)
PLATELETS: 71 10*3/uL — AB (ref 150–400)
Platelets: 46 10*3/uL — ABNORMAL LOW (ref 150–400)
RBC: 2.65 MIL/uL — AB (ref 4.22–5.81)
RBC: 2.54 MIL/uL — ABNORMAL LOW (ref 4.22–5.81)
RDW: 18.4 % — AB (ref 11.5–15.5)
RDW: 17.9 % — AB (ref 11.5–15.5)
WBC: 5 10*3/uL (ref 4.0–10.5)
WBC: 5 10*3/uL (ref 4.0–10.5)

## 2016-08-03 LAB — HIV ANTIBODY (ROUTINE TESTING W REFLEX): HIV Screen 4th Generation wRfx: NONREACTIVE

## 2016-08-03 LAB — PREPARE FRESH FROZEN PLASMA
UNIT DIVISION: 0
UNIT DIVISION: 0

## 2016-08-03 LAB — PREPARE PLATELET PHERESIS
UNIT DIVISION: 0
Unit division: 0

## 2016-08-03 LAB — BPAM FFP
Blood Product Expiration Date: 201803262359
Blood Product Expiration Date: 201803262359
ISSUE DATE / TIME: 201803230718
ISSUE DATE / TIME: 201803230718
Unit Type and Rh: 6200
Unit Type and Rh: 6200

## 2016-08-03 LAB — BPAM PLATELET PHERESIS
BLOOD PRODUCT EXPIRATION DATE: 201803242359
Blood Product Expiration Date: 201803232359
ISSUE DATE / TIME: 201803231026
ISSUE DATE / TIME: 201803230746
UNIT TYPE AND RH: 6200
Unit Type and Rh: 6200

## 2016-08-03 MED ORDER — OXYCODONE HCL 5 MG PO TABS
5.0000 mg | ORAL_TABLET | ORAL | Status: DC | PRN
  Administered 2016-08-03 (×2): 15 mg via ORAL
  Administered 2016-08-03: 10 mg via ORAL
  Administered 2016-08-07 – 2016-08-09 (×7): 15 mg via ORAL
  Filled 2016-08-03 (×4): qty 3
  Filled 2016-08-03: qty 2
  Filled 2016-08-03 (×5): qty 3

## 2016-08-03 MED ORDER — PNEUMOCOCCAL VAC POLYVALENT 25 MCG/0.5ML IJ INJ: 0.5000 mL | INJECTION | INTRAMUSCULAR | Status: DC

## 2016-08-03 NOTE — Progress Notes (Signed)
Chief Complaint: Patient was seen today for follow up splenic embolization  Referring Physician(s): Trauma service, Dr. [name]  Supervising Physician: Richarda Overlie  Patient Status: [facility] - In-pt  Subjective: s/p splenic artery embo yesterday. Remains in ICU, but stable Feels better. Tolerating clears.  Objective: Physical Exam: BP (!) 109/91   Pulse 99   Temp 98.1 F (36.7 C) (Oral)   Resp 19   Ht 5\' 7"  (1.702 m)   Wt 166 lb 3.6 oz (75.4 kg)   SpO2 100%   BMI 26.03 kg/m  Abd: soft, mild LUQ tenderness (R)groin soft, NT, no hematoma Bounding (R)pedal pulses   Current Facility-Administered Medications:  .  dextrose 5 % and 0.45 % NaCl with KCl 20 mEq/L infusion, , Intravenous, Continuous, [name], MD, Last Rate: 50 mL/hr at 08/03/16 1115 .  hydrALAZINE (APRESOLINE) injection 5 mg, 5 mg, Intravenous, Q4H PRN, [name], MD, 5 mg at 08/02/16 2010 .  HYDROmorphone (DILAUDID) injection 1-2 mg, 1-2 mg, Intravenous, Q2H PRN, Jimmye Norman, MD, 1 mg at 08/03/16 0941 .  ondansetron (ZOFRAN) tablet 4 mg, 4 mg, Oral, Q6H PRN **OR** ondansetron (ZOFRAN) injection 4 mg, 4 mg, Intravenous, Q6H PRN, Jimmye Norman, MD, 4 mg at 08/02/16 1840 .  oxyCODONE (Oxy IR/ROXICODONE) immediate release tablet 5-15 mg, 5-15 mg, Oral, Q4H PRN, [name], MD, 10 mg at 08/03/16 0941 .  pantoprazole (PROTONIX) EC tablet 40 mg, 40 mg, Oral, Daily, 40 mg at 08/03/16 0941 **OR** pantoprazole (PROTONIX) injection 40 mg, 40 mg, Intravenous, Daily, Jimmye Norman, MD, 40 mg at 08/02/16 0949  Labs: CBC  Recent Labs  08/02/16 1437 08/03/16 0322  WBC 6.1 5.0  HGB 9.8* 8.4*  HCT 28.9* 24.8*  PLT 102* 71*   BMET  Recent Labs  08/02/16 0556 08/02/16 0606 08/03/16 0322  NA 136 135 134*  K 4.0 5.7* 4.2  CL 107 110 103  CO2 22  --  25  GLUCOSE 163* 160* 123*  BUN 22* 31* 12  CREATININE 1.99* 1.80* 1.16  CALCIUM 7.3*  --  8.4*   LFT  Recent Labs  08/02/16 0556  PROT 4.3*    ALBUMIN 2.1*  AST 32  ALT 19  ALKPHOS 43  BILITOT 0.7   PT/INR  Recent Labs  08/02/16 0556  LABPROT 18.5*  INR 1.53     Studies/Results: Ir Shana Chute Art   Result Date: 08/02/2016 INDICATION: 46 year old male with acute splenic laceration and active hemorrhage. EXAM: IR ULTRASOUND GUIDANCE VASC ACCESS RIGHT; IR EMBO ART VEN HEMORR LYMPH EXTRAV INC GUIDE ROADMAPPING; SELECTIVE VISCERAL ARTERIOGRAPHY; ADDITIONAL ARTERIOGRAPHY MEDICATIONS: None. ANESTHESIA/SEDATION: Moderate (conscious) sedation was employed during this procedure. A total of Versed 1 mg and Fentanyl 50 mcg was administered intravenously. Moderate Sedation Time: 36 minutes. The patient's level of consciousness and vital signs were monitored continuously by radiology nursing throughout the procedure under my direct supervision. CONTRAST:  75 mL Omnipaque 300 FLUOROSCOPY TIME:  Fluoroscopy Time: 15 minutes 24 seconds (627 mGy). COMPLICATIONS: None immediate. PROCEDURE: Informed consent was obtained from the patient following explanation of the procedure, risks, benefits and alternatives. The patient understands, agrees and consents for the procedure. All questions were addressed. A time out was performed prior to the initiation of the procedure. Maximal barrier sterile technique utilized including caps, mask, sterile gowns, sterile gloves, large sterile drape, hand hygiene, and Betadine prep. The right common femoral artery was interrogated with ultrasound and found to be widely patent. An image was obtained and stored for the  medical record. Local anesthesia was attained by infiltration with 1% lidocaine. A small dermatotomy was made. Under real-time sonographic guidance, the vessel was punctured with a 21 gauge micropuncture needle. Using standard technique, the initial micro needle was exchanged over a 0.018 micro wire for a transitional 4 [place] micro sheath. The micro sheath was then exchanged over a 0.035 wire for a 5 French vascular  sheath. A C2 cobra catheter was advanced over a Bentson wire into the abdominal aorta in used to select the celiac artery. A celiac arteriogram was performed: The vessels are atretic consistent with hypertension and diffuse arterial constriction. There is active hemorrhage in the region of the lower pole of the spleen. The cobra catheter could not be advanced successfully into the splenic artery. Therefore, the cobra catheter was exchanged for a sauce Omni selective catheter which was successfully navigated into the celiac trunk. A Lantern microcatheter was then advanced over a Fathom 16 wire and used to select the splenic artery. A splenic arteriogram was performed. Again, there is active extravasation from a distal branch of the lower pole of the splenic artery. There is also a suggestion of contrast extravasation in the mid aspect of the splenic parenchyma. The microcatheter was advanced distally in the lower pole splenic artery. Additional arteriography was performed confirming active bleeding. Coil embolization was then performed using a a 2 x 4 mm soft Ruby detachable microcoils. The microcatheter was then brought back and redirected into a no other actively bleeding branch. Coil embolization was performed utilizing a combination of 2 and 3 mm soft Ruby microcoils. The final coil was a 3 x 15 mm soft coil. The microcatheter was brought back into the main splenic artery. Arteriography was performed. There appears to be continued active extravasation which may be arising from the upper pole artery. The microcatheter was advanced into this artery and arteriography was performed. There is no evidence of continued bleeding. The microcatheter was removed and arteriography was again performed through the 5 French sheath. Again, there is no evidence of active bleeding. There is some residual perfusion of the upper pole of the splenic artery. Arterial vaso constriction is significantly less following cessation of  active bleeding. A limited right common femoral arteriogram was performed confirming common femoral arterial access. Hemostasis was attained with the assistance of a Cordis Exoseal extra arterial closure device. IMPRESSION: 1. Active bleeding from the distal branches of the lower pole splenic artery. 2. Successful coil embolization of the lower pole splenic artery with cessation of active hemorrhage. 3. Evidence of vascular injury in the distribution of the upper pole splenic artery without continued extravasation. No intervention performed in this territory at the time of procedure in an effort to preserve perfusion to a portion of the splenic parenchyma. PLAN: Continued bed rest with serial H&H and transfusion as needed. If patient experiences continued bleeding or sudden new bleeding, additional embolization versus surgical splenectomy could be considered. Signed, Sterling Big, MD [facility] Radiology Electronically Signed   By: Malachy Moan M.D.   On: 08/02/2016 13:17    Assessment/Plan: Grade IV splenic lac s/p arterial embo yesterday Stable. (R)groin site without complication, can remove dressing tomorrow. Call IR if further issues    LOS: 1 day   I spent a total of 20 minutes in face to face in clinical consultation, greater than 50% of which was counseling/coordinating care for splenic lac requiring embolization  Brayton El PA-C 08/03/2016 10:32 AM

## 2016-08-03 NOTE — Progress Notes (Signed)
  Subjective: tol clears, pain medicine not helping, no nausea  Objective: Vital signs in last 24 hours: Temp:  [97.4 F (36.3 C)-98.7 F (37.1 C)] 98.6 F (37 C) (03/24 0400) Pulse Rate:  [60-112] 76 (03/24 0700) Resp:  [9-31] 10 (03/24 0700) BP: (88-175)/(46-115) 103/57 (03/24 0700) SpO2:  [96 %-100 %] 97 % (03/24 0700) Weight:  [75.4 kg (166 lb 3.6 oz)] 75.4 kg (166 lb 3.6 oz) (03/23 1700) Last BM Date: 08/02/16  Intake/Output from previous day: 03/23 0701 - 03/24 0700 In: 3377.5 [P.O.:720; I.V.:2120; Blood:537.5] Out: 1480 [Urine:1480] Intake/Output this shift: No intake/output data recorded.  General appearance: cooperative Resp: clear to auscultation bilaterally Cardio: regular rate and rhythm GI: soft, mild tenderness LUQ, a few BS Extremities: venous stasis dermatitis noted and elevated R clavicle  Lab Results: CBC   Recent Labs  08/02/16 1437 08/03/16 0322  WBC 6.1 5.0  HGB 9.8* 8.4*  HCT 28.9* 24.8*  PLT 102* 71*   BMET  Recent Labs  08/02/16 0556 08/02/16 0606 08/03/16 0322  NA 136 135 134*  K 4.0 5.7* 4.2  CL 107 110 103  CO2 22  --  25  GLUCOSE 163* 160* 123*  BUN 22* 31* 12  CREATININE 1.99* 1.80* 1.16  CALCIUM 7.3*  --  8.4*   PT/INR  Recent Labs  08/02/16 0556  LABPROT 18.5*  INR 1.53   ABG  Recent Labs  08/02/16 0618  PHART 7.325*  HCO3 21.9   Assessment/Plan: Unknown mechanism Grade 4 spleen lac with extrav - S/P angioembolization, continue bed rest, CBC later today ABL plus chronic aplastic anemia - CBC at 1400 Chronic R AC sep FEN - fulls, decrease IVF, increase oxy scale Dispo - SDU  LOS: 1 day    [name], MD, MPH, FACS Trauma: [phone] General Surgery: [phone]  3/24/2018Patient ID: Charles [name], male   DOB: 1970/10/16, 46 y.o.   MRN: [id number]

## 2016-08-04 LAB — BASIC METABOLIC PANEL
Anion gap: 6 (ref 5–15)
BUN: 9 mg/dL (ref 6–20)
CALCIUM: 8.6 mg/dL — AB (ref 8.9–10.3)
CO2: 28 mmol/L (ref 22–32)
CREATININE: 1.13 mg/dL (ref 0.61–1.24)
Chloride: 100 mmol/L — ABNORMAL LOW (ref 101–111)
Glucose, Bld: 116 mg/dL — ABNORMAL HIGH (ref 65–99)
Potassium: 4.1 mmol/L (ref 3.5–5.1)
SODIUM: 134 mmol/L — AB (ref 135–145)

## 2016-08-04 LAB — CBC
HCT: 22 % — ABNORMAL LOW (ref 39.0–52.0)
Hemoglobin: 7.5 g/dL — ABNORMAL LOW (ref 13.0–17.0)
MCH: 32.6 pg (ref 26.0–34.0)
MCHC: 34.1 g/dL (ref 30.0–36.0)
MCV: 95.7 fL (ref 78.0–100.0)
PLATELETS: 38 10*3/uL — AB (ref 150–400)
RBC: 2.3 MIL/uL — ABNORMAL LOW (ref 4.22–5.81)
RDW: 17.7 % — ABNORMAL HIGH (ref 11.5–15.5)
WBC: 5.2 10*3/uL (ref 4.0–10.5)

## 2016-08-04 NOTE — Progress Notes (Signed)
Spoke with MD Dwain Sarna about pt request for x-ray of right shoulder.  No new interventions at this time.  Will continue to monitor and re-evaluate as needed.  MD stated he will follow up as well.

## 2016-08-04 NOTE — Progress Notes (Signed)
Patient c/o nausea. Zofran IV given. Patient then vomitted large amount of coffee ground emesis. Patient cleaned up with complete linen change. Patient stated he feels much better. Patient is currently sleeping. No s/sx of acute distress. Will continue to monitor.

## 2016-08-04 NOTE — Progress Notes (Signed)
  Subjective: Vomited last night but very hungry now, did have BM  Objective: Vital signs in last 24 hours: Temp:  [98.1 F (36.7 C)-98.9 F (37.2 C)] 98.1 F (36.7 C) (03/25 0405) Pulse Rate:  [72-106] 77 (03/25 0721) Resp:  [12-34] 16 (03/25 0721) BP: (88-162)/(60-103) 140/83 (03/25 0721) SpO2:  [91 %-100 %] 94 % (03/25 0721) Last BM Date: 08/02/16  Intake/Output from previous day: 03/24 0701 - 03/25 0700 In: 450 [I.V.:450] Out: 1500 [Urine:1500] Intake/Output this shift: Total I/O In: 870 [P.O.:120; I.V.:750] Out: 650 [Urine:650]  General appearance: cooperative Resp: clear to auscultation bilaterally Cardio: regular rate and rhythm GI: soft, min tend LUQ, active BS  Lab Results: CBC   Recent Labs  08/03/16 1524 08/04/16 0244  WBC 5.0 5.2  HGB 8.1* 7.5*  HCT 24.3* 22.0*  PLT 46* 38*   BMET  Recent Labs  08/03/16 0322 08/04/16 0244  NA 134* 134*  K 4.2 4.1  CL 103 100*  CO2 25 28  GLUCOSE 123* 116*  BUN 12 9  CREATININE 1.16 1.13  CALCIUM 8.4* 8.6*   PT/INR  Recent Labs  08/02/16 0556  LABPROT 18.5*  INR 1.53   ABG  Recent Labs  08/02/16 0618  PHART 7.325*  HCO3 21.9   Assessment/Plan: Unknown mechanism Grade 4 spleen lac with extrav - S/P angioembolization, mobilize, CBC later today ABL plus chronic aplastic anemia - Hb/PLT down a bit, CBC at 1400. His usual PLT 50k Chronic R AC sep FEN - try reg diet, KVO IVF Dispo - SDU, mobilize   LOS: 2 days    [name], MD, MPH, FACS Trauma: [phone] General Surgery: [phone]  3/25/2018Patient ID: Charles [name], male   DOB: Feb 07, 1971, 46 y.o.   MRN: [id number]

## 2016-08-04 NOTE — Progress Notes (Signed)
CRITICAL VALUE ALERT  Critical value received:  Plt 27  Date of notification:  08/04/16  Time of notification:  15:05  Critical value read back:Yes.    Nurse who received alert:  Karigan Cloninger  MD notified (1st page):  [place]  Time of first page:  15:07  Responding MD:  Dwain Sarna

## 2016-08-05 LAB — CBC
HCT: 24.7 % — ABNORMAL LOW (ref 39.0–52.0)
HEMATOCRIT: 21.4 % — AB (ref 39.0–52.0)
HEMOGLOBIN: 7.3 g/dL — AB (ref 13.0–17.0)
Hemoglobin: 8.3 g/dL — ABNORMAL LOW (ref 13.0–17.0)
MCH: 32.2 pg (ref 26.0–34.0)
MCH: 32.3 pg (ref 26.0–34.0)
MCHC: 33.6 g/dL (ref 30.0–36.0)
MCHC: 34.1 g/dL (ref 30.0–36.0)
MCV: 95.7 fL (ref 78.0–100.0)
MCV: 94.7 fL (ref 78.0–100.0)
Platelets: 27 10*3/uL — CL (ref 150–400)
Platelets: 23 10*3/uL — CL (ref 150–400)
RBC: 2.58 MIL/uL — AB (ref 4.22–5.81)
RBC: 2.26 MIL/uL — ABNORMAL LOW (ref 4.22–5.81)
RDW: 16.9 % — AB (ref 11.5–15.5)
RDW: 16.8 % — ABNORMAL HIGH (ref 11.5–15.5)
WBC: 4.8 10*3/uL (ref 4.0–10.5)
WBC: 4.8 10*3/uL (ref 4.0–10.5)

## 2016-08-05 LAB — CBC WITH DIFFERENTIAL/PLATELET
BASOS ABS: 0 10*3/uL (ref 0.0–0.1)
Basophils Absolute: 0 10*3/uL (ref 0.0–0.1)
Basophils Relative: 0 %
Basophils Relative: 0 %
EOS ABS: 0 10*3/uL (ref 0.0–0.7)
EOS PCT: 0 %
Eosinophils Absolute: 0 10*3/uL (ref 0.0–0.7)
Eosinophils Relative: 0 %
HCT: 19.4 % — ABNORMAL LOW (ref 39.0–52.0)
HEMATOCRIT: 21.9 % — AB (ref 39.0–52.0)
Hemoglobin: 7.5 g/dL — ABNORMAL LOW (ref 13.0–17.0)
Hemoglobin: 6.7 g/dL — CL (ref 13.0–17.0)
LYMPHS ABS: 0.9 10*3/uL (ref 0.7–4.0)
LYMPHS PCT: 27 %
Lymphocytes Relative: 19 %
Lymphs Abs: 0.9 10*3/uL (ref 0.7–4.0)
MCH: 32.5 pg (ref 26.0–34.0)
MCH: 32.8 pg (ref 26.0–34.0)
MCHC: 34.2 g/dL (ref 30.0–36.0)
MCHC: 34.5 g/dL (ref 30.0–36.0)
MCV: 94.8 fL (ref 78.0–100.0)
MCV: 95.1 fL (ref 78.0–100.0)
MONO ABS: 0.4 10*3/uL (ref 0.1–1.0)
MONOS PCT: 11 %
Monocytes Absolute: 0.4 10*3/uL (ref 0.1–1.0)
Monocytes Relative: 9 %
NEUTROS ABS: 2.2 10*3/uL (ref 1.7–7.7)
Neutro Abs: 3.3 10*3/uL (ref 1.7–7.7)
Neutrophils Relative %: 63 %
Neutrophils Relative %: 72 %
PLATELETS: 127 10*3/uL — AB (ref 150–400)
Platelets: 19 10*3/uL — CL (ref 150–400)
RBC: 2.31 MIL/uL — ABNORMAL LOW (ref 4.22–5.81)
RBC: 2.04 MIL/uL — ABNORMAL LOW (ref 4.22–5.81)
RDW: 16.8 % — AB (ref 11.5–15.5)
RDW: 16.9 % — ABNORMAL HIGH (ref 11.5–15.5)
WBC: 3.5 10*3/uL — ABNORMAL LOW (ref 4.0–10.5)
WBC: 4.6 10*3/uL (ref 4.0–10.5)

## 2016-08-05 MED ORDER — SODIUM CHLORIDE 0.9 % IV SOLN
Freq: Once | INTRAVENOUS | Status: AC
  Administered 2016-08-05: 10:00:00 via INTRAVENOUS

## 2016-08-05 MED ORDER — DOCUSATE SODIUM 100 MG PO CAPS
100.0000 mg | ORAL_CAPSULE | Freq: Two times a day (BID) | ORAL | Status: DC
  Administered 2016-08-05 – 2016-08-09 (×8): 100 mg via ORAL
  Filled 2016-08-05 (×8): qty 1

## 2016-08-05 MED ORDER — TRAMADOL HCL 50 MG PO TABS
50.0000 mg | ORAL_TABLET | Freq: Four times a day (QID) | ORAL | Status: DC
Start: 2016-08-05 — End: 2016-08-09
  Administered 2016-08-05 – 2016-08-09 (×16): 50 mg via ORAL
  Filled 2016-08-05 (×16): qty 1

## 2016-08-05 MED ORDER — SODIUM CHLORIDE 0.9 % IV SOLN
Freq: Once | INTRAVENOUS | Status: AC
  Administered 2016-08-05: via INTRAVENOUS

## 2016-08-05 MED ORDER — ACETAMINOPHEN 325 MG PO TABS
650.0000 mg | ORAL_TABLET | Freq: Four times a day (QID) | ORAL | Status: DC
  Administered 2016-08-05 – 2016-08-09 (×17): 650 mg via ORAL
  Filled 2016-08-05 (×17): qty 2

## 2016-08-05 MED ORDER — SODIUM CHLORIDE 0.9 % IV SOLN
Freq: Once | INTRAVENOUS | Status: AC
  Administered 2016-08-05: 17:00:00 via INTRAVENOUS

## 2016-08-05 MED ORDER — HYDROMORPHONE HCL 1 MG/ML IJ SOLN
1.0000 mg | Freq: Four times a day (QID) | INTRAMUSCULAR | Status: DC | PRN
  Administered 2016-08-05 – 2016-08-09 (×10): 1 mg via INTRAVENOUS
  Filled 2016-08-05 (×11): qty 1

## 2016-08-05 MED ORDER — POLYETHYLENE GLYCOL 3350 17 G PO PACK: 17.0000 g | PACK | Freq: Every day | ORAL | Status: DC | PRN

## 2016-08-05 NOTE — Progress Notes (Signed)
Patient ID: Charles [name], male   DOB: 05/01/1971, 46 y.o.   MRN: [id number]  [facility] Surgery Progress Note     Subjective: Sitting up in bed smiling. States that oral pain meds do not help. He likes the IV meds because they put him to sleep. Tolerating regular diet. Passing flatus, no BM. Denies n/v. Has not been out of bed.  Objective: Vital signs in last 24 hours: Temp:  [98.7 F (37.1 C)-99.5 F (37.5 C)] 98.7 F (37.1 C) (03/26 0405) Pulse Rate:  [90-103] 90 (03/26 0405) Resp:  [21-24] 21 (03/26 0405) BP: (137-188)/(64-84) 137/65 (03/26 0405) SpO2:  [94 %-99 %] 99 % (03/26 0405) Last BM Date: 08/04/16  Intake/Output from previous day: 03/25 0701 - 03/26 0700 In: 1262 [P.O.:360; I.V.:902] Out: 4350 [Urine:4350] Intake/Output this shift: Total I/O In: -  Out: 400 [Urine:400]  PE: Gen:  Alert, NAD, cooperative Card:  RRR, no M/G/R heard Pulm:  CTAB, no W/R/R, effort normal Abd: Soft, ND, minimal LUQ tenderness, +BS, no HSM, dry dressing to right groin Ext:  No erythema, edema, or tenderness   Lab Results:   Recent Labs  08/04/16 1322 08/05/16 0352  WBC 4.8 4.8  HGB 8.3* 7.3*  HCT 24.7* 21.4*  PLT 27* 23*   BMET  Recent Labs  08/03/16 0322 08/04/16 0244  NA 134* 134*  K 4.2 4.1  CL 103 100*  CO2 25 28  GLUCOSE 123* 116*  BUN 12 9  CREATININE 1.16 1.13  CALCIUM 8.4* 8.6*   PT/INR No results for input(s): LABPROT, INR in the last 72 hours. CMP     Component Value Date/Time   NA 134 (L) 08/04/2016 0244   K 4.1 08/04/2016 0244   CL 100 (L) 08/04/2016 0244   CO2 28 08/04/2016 0244   GLUCOSE 116 (H) 08/04/2016 0244   BUN 9 08/04/2016 0244   CREATININE 1.13 08/04/2016 0244   CALCIUM 8.6 (L) 08/04/2016 0244   PROT 4.3 (L) 08/02/2016 0556   ALBUMIN 2.1 (L) 08/02/2016 0556   AST 32 08/02/2016 0556   ALT 19 08/02/2016 0556   ALKPHOS 43 08/02/2016 0556   BILITOT 0.7 08/02/2016 0556   GFRNONAA >60 08/04/2016 0244   GFRAA >60 08/04/2016  0244   Lipase  No results found for: LIPASE     Studies/Results: No results found.  Anti-infectives: Anti-infectives    None       Assessment/Plan Unknown mechanism Grade 4 spleen lac with extrav - S/P angioembolization, mobilize ABL plus chronic aplastic anemia - Platelet 23 this morning, give 1 unit platelets and recheck CBC this afternoon. His usual PLT 50k Chronic R AC sep Tobacco abuse  Pain - schedule tramadol and tylenol, oxy 5-15mg  q4hr PRN, dilaudid 1mg  q6hr PRN breakthrough pain VTE - SCDs FEN - regular diet, KVO IVF; add colace BID and miralax PRN Dispo - SDU, ambulate q shift, recheck labs in AM   LOS: 3 days    Edson Snowball , [facility] Surgery 08/05/2016, 8:34 AM Pager: [phone] Consults: [phone] Mon-Fri 7:00 am-4:30 pm Sat-Sun 7:00 am-11:30 am

## 2016-08-05 NOTE — Progress Notes (Signed)
Received call from nurse that platelets dropped from 23 to 19 after 1 unit of platelets were given earlier today. Will order an additional unit of platelets. CBC to follow.   Mattie Marlin, [facility] Surgery Pager [phone]

## 2016-08-05 NOTE — Progress Notes (Addendum)
CRITICAL VALUE ALERT  Critical value received:  Hgb 6.7   Date of notification:  08/05/16  Time of notification:  2122  Critical value read back:Yes.    Nurse who received alert:  Isidor Holts   MD notified (1st page):  Dr. Luisa Hart  Time of first page:  2124  MD notified (2nd page):Dr Cornett  Time of second page:2140  Responding MD: Dr Luisa Hart  Time MD responded: 2142

## 2016-08-05 NOTE — Progress Notes (Signed)
CRITICAL VALUE ALERT  Critical value received:  Plt 17  Date of notification:  03/26  Time of notification:  16:32  Nurse who received alert: Zyhir Cappella  MD notified:  [place]

## 2016-08-06 LAB — TYPE AND SCREEN
ABO/RH(D): A POS
Antibody Screen: NEGATIVE
UNIT DIVISION: 0
UNIT DIVISION: 0
UNIT DIVISION: 0
UNIT DIVISION: 0
UNIT DIVISION: 0
UNIT DIVISION: 0
UNIT DIVISION: 0
Unit division: 0
Unit division: 0
Unit division: 0

## 2016-08-06 LAB — CBC
HCT: 23.5 % — ABNORMAL LOW (ref 39.0–52.0)
Hemoglobin: 8.3 g/dL — ABNORMAL LOW (ref 13.0–17.0)
MCH: 32.3 pg (ref 26.0–34.0)
MCHC: 35.3 g/dL (ref 30.0–36.0)
MCV: 91.4 fL (ref 78.0–100.0)
Platelets: 98 10*3/uL — ABNORMAL LOW (ref 150–400)
RBC: 2.57 MIL/uL — AB (ref 4.22–5.81)
RDW: 15.6 % — ABNORMAL HIGH (ref 11.5–15.5)
WBC: 4.7 10*3/uL (ref 4.0–10.5)

## 2016-08-06 LAB — BPAM RBC
BLOOD PRODUCT EXPIRATION DATE: 201804052359
BLOOD PRODUCT EXPIRATION DATE: 201804062359
BLOOD PRODUCT EXPIRATION DATE: 201804102359
BLOOD PRODUCT EXPIRATION DATE: 201804112359
BLOOD PRODUCT EXPIRATION DATE: 201804122359
BLOOD PRODUCT EXPIRATION DATE: 201804132359
BLOOD PRODUCT EXPIRATION DATE: 201804132359
Blood Product Expiration Date: 201804042359
Blood Product Expiration Date: 201804132359
Blood Product Expiration Date: 201804132359
ISSUE DATE / TIME: 201803230549
ISSUE DATE / TIME: 201803230549
ISSUE DATE / TIME: 201803240258
ISSUE DATE / TIME: 201803261152
ISSUE DATE / TIME: 201803262126
ISSUE DATE / TIME: 201803262126
UNIT TYPE AND RH: 5100
UNIT TYPE AND RH: 9500
UNIT TYPE AND RH: 9500
Unit Type and Rh: 5100
Unit Type and Rh: 5100
Unit Type and Rh: 5100
Unit Type and Rh: 9500
Unit Type and Rh: 9500
Unit Type and Rh: 9500
Unit Type and Rh: 9500

## 2016-08-06 LAB — BPAM PLATELET PHERESIS
BLOOD PRODUCT EXPIRATION DATE: 201803271639
Blood Product Expiration Date: 201803262359
ISSUE DATE / TIME: 201803261008
ISSUE DATE / TIME: 201803261708
Unit Type and Rh: 6200
Unit Type and Rh: 6200

## 2016-08-06 LAB — PREPARE PLATELET PHERESIS
UNIT DIVISION: 0
Unit division: 0

## 2016-08-06 LAB — PREPARE RBC (CROSSMATCH)

## 2016-08-06 NOTE — Plan of Care (Signed)
Problem: Education: Goal: Knowledge of [facility] Education information/materials will improve Outcome: Progressing Educated patient in need to transfuse Packed red blood cells and signs and symptoms of reactions. Patient verbalized understanding.

## 2016-08-06 NOTE — Care Management Important Message (Signed)
Important Message  Patient Details  Name: Charles [name] MRN: [id number] Date of Birth: 1971-04-11   Medicare Important Message Given:  Yes    Kyla Balzarine 08/06/2016, 11:38 AM

## 2016-08-06 NOTE — Progress Notes (Signed)
[facility] Progress Note     Subjective: Reports dizziness with standing yesterday, so he did not ambulate. Intermittent abdominal pain (LUQ) controlled with medication. Tolerating PO intake. Urinating and having flatus.  Objective: Vital signs in last 24 hours: Temp:  [97.2 F (36.2 C)-98.7 F (37.1 C)] 98.1 F (36.7 C) (03/27 0700) Pulse Rate:  [69-112] 80 (03/27 0700) Resp:  [13-27] 17 (03/27 0700) BP: (96-164)/(59-112) 133/80 (03/27 0700) SpO2:  [98 %-100 %] 100 % (03/27 0700) Last BM Date: 08/04/16  Intake/Output from previous day: 03/26 0701 - 03/27 0700 In: 2104 [P.O.:1200; Blood:904] Out: 1750 [Urine:1750] Intake/Output this shift: No intake/output data recorded.  PE: Gen:  Alert, NAD, pleasant Card:  Regular rate and rhythm Pulm:  Non-labored, clear to auscultation bilaterally Abd: Soft, non-tender, non-distended, bowel sounds present in all 4 quadrants Ext:  No erythema, edema, or tenderness   Lab Results:   Recent Labs  08/05/16 1543 08/05/16 2043  WBC 3.5* 4.6  HGB 7.5* 6.7*  HCT 21.9* 19.4*  PLT 19* 127*   BMET  Recent Labs  08/04/16 0244  NA 134*  K 4.1  CL 100*  CO2 28  GLUCOSE 116*  BUN 9  CREATININE 1.13  CALCIUM 8.6*   PT/INR No results for input(s): LABPROT, INR in the last 72 hours. CMP     Component Value Date/Time   NA 134 (L) 08/04/2016 0244   K 4.1 08/04/2016 0244   CL 100 (L) 08/04/2016 0244   CO2 28 08/04/2016 0244   GLUCOSE 116 (H) 08/04/2016 0244   BUN 9 08/04/2016 0244   CREATININE 1.13 08/04/2016 0244   CALCIUM 8.6 (L) 08/04/2016 0244   PROT 4.3 (L) 08/02/2016 0556   ALBUMIN 2.1 (L) 08/02/2016 0556   AST 32 08/02/2016 0556   ALT 19 08/02/2016 0556   ALKPHOS 43 08/02/2016 0556   BILITOT 0.7 08/02/2016 0556   GFRNONAA >60 08/04/2016 0244   GFRAA >60 08/04/2016 0244   Lipase  No results found for: LIPASE     Studies/Results: No results found.  Anti-infectives: Anti-infectives    None       Assessment/Plan Unknown mechanism Grade 4 spleen lac with extrav- S/P angioembolization, mobilize ABL plus chronic aplastic anemia- platelets 50K at baseline; Platelets 127 yesterday; S/P 2 units platelets and 2 units PRBC 3/26; recheck CBC this afternoon.  Chronic R AC sep Tobacco abuse  Pain - schedule tramadol and tylenol, oxy 5-15mg  q4hr PRN, dilaudid 1mg  q6hr PRN breakthrough pain VTE - SCDs FEN- regular diet, KVO IVF; colace BID and miralax PRN Dispo- SDU, ambulate q shift, recheck labs this afternoon and AM    LOS: 4 days    Adam Phenix , [facility] Surgery 08/06/2016, 8:11 AM Pager: [phone] Consults: [phone] Mon-Fri 7:00 am-4:30 pm Sat-Sun 7:00 am-11:30 am

## 2016-08-07 LAB — BPAM RBC
BLOOD PRODUCT EXPIRATION DATE: 201804172359
Blood Product Expiration Date: 201804172359
ISSUE DATE / TIME: 201803270533
ISSUE DATE / TIME: 201803270227
UNIT TYPE AND RH: 6200
Unit Type and Rh: 6200

## 2016-08-07 LAB — BASIC METABOLIC PANEL
Anion gap: 7 (ref 5–15)
BUN: 14 mg/dL (ref 6–20)
CO2: 27 mmol/L (ref 22–32)
Calcium: 8.4 mg/dL — ABNORMAL LOW (ref 8.9–10.3)
Chloride: 102 mmol/L (ref 101–111)
Creatinine, Ser: 1.09 mg/dL (ref 0.61–1.24)
GFR calc Af Amer: >60 mL/min (ref >60)
GLUCOSE: 126 mg/dL — AB (ref 65–99)
POTASSIUM: 4 mmol/L (ref 3.5–5.1)
Sodium: 136 mmol/L (ref 135–145)

## 2016-08-07 LAB — CBC
HEMATOCRIT: 21.6 % — AB (ref 39.0–52.0)
Hemoglobin: 7.5 g/dL — ABNORMAL LOW (ref 13.0–17.0)
MCH: 32.2 pg (ref 26.0–34.0)
MCHC: 34.7 g/dL (ref 30.0–36.0)
MCV: 92.7 fL (ref 78.0–100.0)
Platelets: 85 10*3/uL — ABNORMAL LOW (ref 150–400)
RBC: 2.33 MIL/uL — ABNORMAL LOW (ref 4.22–5.81)
RDW: 16.2 % — AB (ref 11.5–15.5)
WBC: 4.4 10*3/uL (ref 4.0–10.5)

## 2016-08-07 LAB — TYPE AND SCREEN
ABO/RH(D): A POS
Antibody Screen: NEGATIVE
UNIT DIVISION: 0
Unit division: 0

## 2016-08-07 NOTE — Progress Notes (Signed)
[facility] Progress Note     Subjective: Cc dizziness, but pt states improved compared to yesterday. Pain controlled. Tolerating PO. Ambulating in his room.  Objective: Vital signs in last 24 hours: Temp:  [97.8 F (36.6 C)-98.6 F (37 C)] 98.1 F (36.7 C) (03/28 1123) Pulse Rate:  [77-98] 98 (03/28 0520) Resp:  [13-22] 19 (03/28 0520) BP: (149-165)/(84-104) 165/90 (03/28 1123) SpO2:  [99 %-100 %] 100 % (03/28 0520) Last BM Date: 08/05/16  Intake/Output from previous day: 03/27 0701 - 03/28 0700 In: 1218 [P.O.:720; I.V.:140; Blood:358] Out: 300 [Urine:300] Intake/Output this shift: Total I/O In: -  Out: 325 [Urine:325]  PE: Gen:  Alert, NAD, pleasant Card:  Regular rate and rhythm Pulm:  Non-labored, clear to auscultation bilaterally Abd: Soft, non-tender, non-distended, bowel sounds present in all 4 quadrants Ext:  No erythema, edema, or tenderness   Lab Results:   Recent Labs  08/06/16 1503 08/07/16 0336  WBC 4.7 4.4  HGB 8.3* 7.5*  HCT 23.5* 21.6*  PLT 98* 85*   BMET  Recent Labs  08/07/16 0336  NA 136  K 4.0  CL 102  CO2 27  GLUCOSE 126*  BUN 14  CREATININE 1.09  CALCIUM 8.4*   PT/INR No results for input(s): LABPROT, INR in the last 72 hours. CMP     Component Value Date/Time   NA 136 08/07/2016 0336   K 4.0 08/07/2016 0336   CL 102 08/07/2016 0336   CO2 27 08/07/2016 0336   GLUCOSE 126 (H) 08/07/2016 0336   BUN 14 08/07/2016 0336   CREATININE 1.09 08/07/2016 0336   CALCIUM 8.4 (L) 08/07/2016 0336   PROT 4.3 (L) 08/02/2016 0556   ALBUMIN 2.1 (L) 08/02/2016 0556   AST 32 08/02/2016 0556   ALT 19 08/02/2016 0556   ALKPHOS 43 08/02/2016 0556   BILITOT 0.7 08/02/2016 0556   GFRNONAA >60 08/07/2016 0336   GFRAA >60 08/07/2016 0336   Lipase  No results found for: LIPASE     Studies/Results: No results found.  Anti-infectives: Anti-infectives    None       Assessment/Plan Unknown mechanism Grade 4 spleen  lac with extrav- S/P angioembolization, mobilize ABL plus chronic aplastic anemia- platelets 50K at baseline; Platelets 80 today; S/P 2 units platelets and 2 units PRBC 3/26; hgb 7.5 from 8.3. CBC in AM. Chronic R AC sep Tobacco abuse  Pain - schedule tramadol and tylenol, oxy 5-15mg  q4hr PRN, dilaudid 1mg  q6hr PRN breakthrough pain VTE - SCDs FEN- regulardiet, KVO IVF; colace BID and miralax PRN Dispo- SDU, ambulate q shift, CBC in AM -- hgb/hct/plts are still trending down, may require repeat angioembolization vs OR.  Adam Phenix , [facility] Surgery 08/07/2016, 3:56 PM Pager: [phone] Consults: [phone] Mon-Fri 7:00 am-4:30 pm Sat-Sun 7:00 am-11:30 am

## 2016-08-07 NOTE — Progress Notes (Signed)
PT Cancellation Note  Patient Details Name: Endy Mazzaferro MRN: [id number] DOB: 1971/01/31   Cancelled Treatment:    Reason Eval/Treat Not Completed: Medical issues which prohibited therapy. Will await transfusion then follow up   Fabio Asa 08/07/2016, 10:20 AM

## 2016-08-08 LAB — CBC
HCT: 23.3 % — ABNORMAL LOW (ref 39.0–52.0)
HEMOGLOBIN: 8 g/dL — AB (ref 13.0–17.0)
MCH: 32.3 pg (ref 26.0–34.0)
MCHC: 34.3 g/dL (ref 30.0–36.0)
MCV: 94 fL (ref 78.0–100.0)
Platelets: 36 10*3/uL — ABNORMAL LOW (ref 150–400)
RBC: 2.48 MIL/uL — AB (ref 4.22–5.81)
RDW: 16 % — ABNORMAL HIGH (ref 11.5–15.5)
WBC: 4.5 10*3/uL (ref 4.0–10.5)

## 2016-08-08 NOTE — Progress Notes (Signed)
  Subjective: Vomited yesterday but no nausea and eating now, no abd pain  Objective: Vital signs in last 24 hours: Temp:  [97.5 F (36.4 C)-98.7 F (37.1 C)] 98.7 F (37.1 C) (03/29 0500) Pulse Rate:  [85-108] 87 (03/29 0500) Resp:  [14-20] 14 (03/29 0500) BP: (142-180)/(82-97) 156/97 (03/29 0500) SpO2:  [97 %-99 %] 97 % (03/29 0500) Last BM Date: 08/07/16  Intake/Output from previous day: 03/28 0701 - 03/29 0700 In: 480 [P.O.:480] Out: 2350 [Urine:2350] Intake/Output this shift: Total I/O In: -  Out: 600 [Urine:600]  General appearance: cooperative Resp: clear to auscultation bilaterally Cardio: regular rate and rhythm GI: soft, NT  Lab Results: CBC   Recent Labs  08/07/16 0336 08/08/16 0326  WBC 4.4 4.5  HGB 7.5* 8.0*  HCT 21.6* 23.3*  PLT 85* 36*   BMET  Recent Labs  08/07/16 0336  NA 136  K 4.0  CL 102  CO2 27  GLUCOSE 126*  BUN 14  CREATININE 1.09  CALCIUM 8.4*   PT/INR No results for input(s): LABPROT, INR in the last 72 hours. ABG No results for input(s): PHART, HCO3 in the last 72 hours.  Invalid input(s): PCO2, PO2  Studies/Results: No results found.  Anti-infectives: Anti-infectives    None      Assessment/Plan: Unknown mechanism Grade 4 spleen lac with extrav- S/P angioembolization, mobilize ABL plus chronic aplastic anemia- platelets 50K at baseline; Hb up but PLTs lower. CBC in AM. Chronic R AC sep Tobacco abuse Pain - schedule tramadol and tylenol, oxy 5-15mg  q4hr PRN, dilaudid 1mg  q6hr PRN breakthrough pain VTE - SCDs FEN- regulardiet Dispo- to floor, CBC in AM, D/C if stable   LOS: 6 days    [name], MD, MPH, FACS Trauma: [phone] General Surgery: [phone]  3/29/2018Patient ID: Charles [name], male   DOB: May 30, 1970, 46 y.o.   MRN: [id number]

## 2016-08-08 NOTE — Progress Notes (Signed)
Patient reported that he doesn't feel well anytime he is hungry. However patient had breakfast, multiple ice creams, and snacks between breakfast and lunch and now his lunch tray has arrived. Pt stated, "I always feel like this when I am hungry, even when I am on the streets."

## 2016-08-08 NOTE — Progress Notes (Signed)
PT Cancellation Note  Patient Details Name: Charles [name] MRN: [id number] DOB: 1970-08-22   Cancelled Treatment:    Reason Eval/Treat Not Completed: Patient declined, no reason specified. Pt refusing to participate in therapy session at this time. Pt stated "I'm not going to do anything until I get some food on my stomach". Pt also gagging and attempting to vomit over trash can. PT notified pt's RN. PT will continue to f/u with pt as available.    Alessandra Bevels Bernyce Brimley 08/08/2016, 1:21 PM

## 2016-08-09 LAB — CBC
HEMATOCRIT: 25.4 % — AB (ref 39.0–52.0)
HEMOGLOBIN: 8.8 g/dL — AB (ref 13.0–17.0)
MCH: 32.7 pg (ref 26.0–34.0)
MCHC: 34.6 g/dL (ref 30.0–36.0)
MCV: 94.4 fL (ref 78.0–100.0)
Platelets: 31 10*3/uL — ABNORMAL LOW (ref 150–400)
RBC: 2.69 MIL/uL — AB (ref 4.22–5.81)
RDW: 16.2 % — ABNORMAL HIGH (ref 11.5–15.5)
WBC: 4.6 10*3/uL (ref 4.0–10.5)

## 2016-08-09 NOTE — Progress Notes (Signed)
Discharge paperwork given to patient. IV removed by NT. Patient is ready for discharge.

## 2016-08-09 NOTE — Discharge Summary (Signed)
  Patient ID: Charles [name] [id number] 46 y.o. 1971/01/09  08/02/2016  Discharge date and time: 08/09/2016   Admitting Physician: Loretha Brasil  Discharge Physician: Glenna Fellows T  Admission Diagnoses: Shock [facility]) [R57.9] Spleen injury [S36.00XA] Laceration of spleen, initial encounter [S36.039A] Hypovolemia due to hemorrhage [E86.1, R58]  Discharge Diagnoses: Same  Operations: Embolization of splenic injury  Admission Condition: poor  Discharged Condition: good  Indication for Admission: Patient was brought to the emergency department by EMS due to abdominal pain and hypotension. He lives alone and there was no history of trauma elicited.  Hospital Course: He was found to be hypotensive and poorly responsive complaining of abdominal pain. CT scan revealed a splenic laceration with active extravasation. He underwent angiography and embolization with control of bleeding. Patient has a history of aplastic anemia with low platelet count. His hemoglobin decreased to about 7 by the time of discharge is up to 8.8. Platelet count is remaining stable at about 30. He steadily improved from this point on at the time of discharge is tolerating a diet, up ambulatory and denying any abdominal pain.  Treatments: Embolization of grade 4 splenic laceration  Disposition: Home  Patient Instructions:  Allergies as of 08/09/2016      Reactions   Banana Nausea And Vomiting   Eggs Or Egg-derived Products Nausea And Vomiting   Pork-derived Products Nausea And Vomiting      Medication List    STOP taking these medications   fluticasone 50 MCG/ACT nasal spray Commonly known as:  FLONASE   oxyCODONE-acetaminophen 5-325 MG tablet Commonly known as:  PERCOCET/ROXICET       Activity: Distracted to avoid strenuous activity or any risk of abdominal trauma for several weeks Diet: regular diet Wound Care: none needed  Follow-up:  With Dr. Parke Simmers in 1 week.  Signed: Mariella Saa MD, FACS  08/09/2016, 9:21 AM

## 2016-08-09 NOTE — Progress Notes (Signed)
Patient ID: Charles [name], male   DOB: 18-May-1970, 46 y.o.   MRN: [id number]       Subjective: No complaints this morning. Anxious to go home. No abdominal pain or nausea.  Objective: Vital signs in last 24 hours: Temp:  [98.2 F (36.8 C)-98.4 F (36.9 C)] 98.2 F (36.8 C) (03/30 0453) Pulse Rate:  [69-83] 69 (03/30 0453) Resp:  [18-21] 18 (03/30 0453) BP: (159-173)/(78-99) 173/99 (03/30 0453) SpO2:  [97 %-100 %] 97 % (03/30 0453) Last BM Date: 08/08/16  Intake/Output from previous day: 03/29 0701 - 03/30 0700 In: -  Out: 2177 [Urine:2177] Intake/Output this shift: No intake/output data recorded.  General appearance: alert, cooperative and no distress GI: normal findings: soft, non-tender and Nondistended  Lab Results:   Recent Labs  08/08/16 0326 08/09/16 0608  WBC 4.5 4.6  HGB 8.0* 8.8*  HCT 23.3* 25.4*  PLT 36* 31*   BMET  Recent Labs  08/07/16 0336  NA 136  K 4.0  CL 102  CO2 27  GLUCOSE 126*  BUN 14  CREATININE 1.09  CALCIUM 8.4*     Studies/Results: No results found.  Anti-infectives: Anti-infectives    None      Assessment/Plan: Grade 4 splenic laceration mechanism unknown. Status post embolization with control of bleeding. Hemoglobin increasing. Has chronic anemia that appears stable. Okay for discharge He will contact Dr. Parke Simmers for follow-up within the next 1-2 weeks to check blood counts. Caution to avoid strenuous activity or any chance of abdominal trauma for the next several weeks.     LOS: 7 days    Annahi Short T 08/09/2016

## 2016-08-09 NOTE — Discharge Instructions (Signed)
Blunt Abdominal Trauma  Blunt abdominal trauma is a type of injury that involves damage to the abdominal wall or to abdominal organs, such as the liver or spleen. The damage can involve bruising, tearing, or a rupture. This type of injury does not involve a puncture of the skin.  Blunt abdominal trauma can range from mild to severe. In some cases it can lead to a severe abdominal inflammation (peritonitis), severe bleeding, and a dangerous drop in blood pressure.  What are the causes?  This injury is caused by a hard, direct hit to the abdomen. It can happen after:  · A motor vehicle accident.  · Being kicked or punched in the abdomen.  · Falling from a significant height.    What increases the risk?  This injury is more likely to happen in people who:  · Play contact sports.  · Work in a job in which falls or injuries are more likely, such as in construction.    What are the signs or symptoms?  The main symptom of this condition is pain in the abdomen. Other symptoms depend on the type and location of the injury. They can include:  · Abdominal pain that spreads to the the back or shoulder.  · Bruising.  · Swelling.  · Pain when pressing on the abdomen.  · Blood in the urine.  · Weakness.  · Confusion.  · Loss of consciousness.  · Pale, dusky, cool, or sweaty skin.  · Vomiting blood.  · Bloody stool or bleeding from the rectum.  · Trouble breathing.    Symptoms of this injury can develop suddenly or slowly.  How is this diagnosed?  This injury is diagnosed based on your symptoms and a physical exam. You may also have tests, including:  · Blood tests.  · Urine tests.  · Imaging tests, such as:  ? A CT scan and ultrasound of your abdomen.  ? X-rays of your chest and abdomen.  · A test in which a tube is used to flush your abdomen with fluid and check for blood (diagnostic peritoneal lavage).    How is this treated?  Treatment for this injury depends on its type and severity. Treatment options include:  · Observation.  If the injury is mild, this may be the only treatment needed.  · Support of your blood pressure and breathing.  · Getting blood, fluids, or medicine through an IV tube.  · Antibiotic medicine.  · Insertion of tubes into the stomach or bladder.  · A blood transfusion.  · A procedure to stop bleeding. This involves putting a long, thin tube (catheter) into one of your blood vessels (angiographic embolization).  · Surgery to open up your abdomen and control bleeding or repair damage (laparotomy). This may be done if tests suggest that you have peritonitis or bleeding that cannot be controlled with angiographic embolization.    Follow these instructions at home:  · Take medicines only as directed by your health care provider.  · If you were prescribed an antibiotic medicine, finish all of it even if you start to feel better.  · Follow your health care provider's instructions about diet and activity restrictions.  · Keep all follow-up visits as directed by your health care provider. This is important.  Contact a health care provider if:  · You continue to have abdominal pain.  · Your symptoms return.  · You develop new symptoms.  · You have blood in your urine or your bowel   movements.  Get help right away if:  · You vomit blood.  · You have heavy bleeding from your rectum.  · You have very bad abdominal pain.  · You have trouble breathing.  · You have chest pain.  · You have a fever.  · You have dizziness.  · You pass out.  This information is not intended to replace advice given to you by your health care provider. Make sure you discuss any questions you have with your health care provider.  Document Released: 06/06/2004 Document Revised: 09/07/2015 Document Reviewed: 04/20/2014  Elsevier Interactive Patient Education © 2017 [employer]

## 2016-09-25 ENCOUNTER — Encounter (HOSPITAL_COMMUNITY): Payer: Self-pay | Admitting: *Deleted

## 2016-09-25 ENCOUNTER — Emergency Department (HOSPITAL_COMMUNITY): Payer: Medicare Other

## 2016-09-25 ENCOUNTER — Emergency Department (HOSPITAL_COMMUNITY)
Admission: EM | Admit: 2016-09-25 | Discharge: 2016-09-26 | Disposition: A | Discharge status: Home / Self Care | Payer: Medicare Other | Attending: Emergency Medicine | Admitting: Emergency Medicine

## 2016-09-25 DIAGNOSIS — Z79899 Other long term (current) drug therapy: Secondary | ICD-10-CM | POA: Insufficient documentation

## 2016-09-25 DIAGNOSIS — D696 Thrombocytopenia, unspecified: Secondary | ICD-10-CM | POA: Insufficient documentation

## 2016-09-25 DIAGNOSIS — R109 Unspecified abdominal pain: Secondary | ICD-10-CM | POA: Diagnosis not present

## 2016-09-25 DIAGNOSIS — F17200 Nicotine dependence, unspecified, uncomplicated: Secondary | ICD-10-CM | POA: Insufficient documentation

## 2016-09-25 DIAGNOSIS — R1084 Generalized abdominal pain: Secondary | ICD-10-CM | POA: Insufficient documentation

## 2016-09-25 DIAGNOSIS — I10 Essential (primary) hypertension: Secondary | ICD-10-CM | POA: Insufficient documentation

## 2016-09-25 DIAGNOSIS — M5431 Sciatica, right side: Secondary | ICD-10-CM | POA: Diagnosis not present

## 2016-09-25 DIAGNOSIS — R112 Nausea with vomiting, unspecified: Secondary | ICD-10-CM | POA: Diagnosis not present

## 2016-09-25 DIAGNOSIS — R1011 Right upper quadrant pain: Secondary | ICD-10-CM | POA: Diagnosis present

## 2016-09-25 LAB — COMPREHENSIVE METABOLIC PANEL
ALK PHOS: 73 U/L (ref 38–126)
ALT: 30 U/L (ref 17–63)
AST: 36 U/L (ref 15–41)
Albumin: 3.6 g/dL (ref 3.5–5.0)
Anion gap: 7 (ref 5–15)
BILIRUBIN TOTAL: 1 mg/dL (ref 0.3–1.2)
BUN: 23 mg/dL — AB (ref 6–20)
CALCIUM: 9 mg/dL (ref 8.9–10.3)
CO2: 24 mmol/L (ref 22–32)
CREATININE: 1.25 mg/dL — AB (ref 0.61–1.24)
Chloride: 105 mmol/L (ref 101–111)
Glucose, Bld: 105 mg/dL — ABNORMAL HIGH (ref 65–99)
Potassium: 4.2 mmol/L (ref 3.5–5.1)
Sodium: 136 mmol/L (ref 135–145)
Total Protein: 7.2 g/dL (ref 6.5–8.1)

## 2016-09-25 LAB — URINALYSIS, ROUTINE W REFLEX MICROSCOPIC
Bacteria, UA: NONE SEEN
Bilirubin Urine: NEGATIVE
GLUCOSE, UA: NEGATIVE mg/dL
KETONES UR: NEGATIVE mg/dL
LEUKOCYTES UA: NEGATIVE
Nitrite: NEGATIVE
PH: 5 (ref 5.0–8.0)
PROTEIN: NEGATIVE mg/dL
SQUAMOUS EPITHELIAL / LPF: NONE SEEN
Specific Gravity, Urine: 1.041 — ABNORMAL HIGH (ref 1.005–1.030)
WBC, UA: NONE SEEN WBC/hpf (ref 0–5)

## 2016-09-25 LAB — RAPID URINE DRUG SCREEN, HOSP PERFORMED
Amphetamines: NOT DETECTED
BARBITURATES: NOT DETECTED
BENZODIAZEPINES: NOT DETECTED
COCAINE: POSITIVE — AB
Opiates: POSITIVE — AB
TETRAHYDROCANNABINOL: NOT DETECTED

## 2016-09-25 LAB — CBC
HCT: 40.5 % (ref 39.0–52.0)
Hemoglobin: 14 g/dL (ref 13.0–17.0)
MCH: 34.1 pg — ABNORMAL HIGH (ref 26.0–34.0)
MCHC: 34.6 g/dL (ref 30.0–36.0)
MCV: 98.5 fL (ref 78.0–100.0)
PLATELETS: 60 10*3/uL — AB (ref 150–400)
RBC: 4.11 MIL/uL — AB (ref 4.22–5.81)
RDW: 16.5 % — ABNORMAL HIGH (ref 11.5–15.5)
WBC: 3.5 10*3/uL — AB (ref 4.0–10.5)

## 2016-09-25 LAB — LIPASE, BLOOD: Lipase: 20 U/L (ref 11–51)

## 2016-09-25 LAB — ETHANOL: Alcohol, Ethyl (B): <5 mg/dL (ref <5)

## 2016-09-25 MED ORDER — ONDANSETRON HCL 4 MG/2ML IJ SOLN
4.0000 mg | Freq: Once | INTRAMUSCULAR | Status: AC
  Administered 2016-09-25: 4 mg via INTRAVENOUS
  Filled 2016-09-25: qty 2

## 2016-09-25 MED ORDER — IOPAMIDOL (ISOVUE-300) INJECTION 61%
INTRAVENOUS | Status: AC
  Administered 2016-09-25: 100 mL
  Filled 2016-09-25: qty 100

## 2016-09-25 MED ORDER — MORPHINE SULFATE (PF) 4 MG/ML IV SOLN
4.0000 mg | Freq: Once | INTRAVENOUS | Status: AC
  Administered 2016-09-25: 4 mg via INTRAVENOUS
  Filled 2016-09-25: qty 1

## 2016-09-25 MED ORDER — SODIUM CHLORIDE 0.9 % IV BOLUS (SEPSIS)
1000.0000 mL | Freq: Once | INTRAVENOUS | Status: AC
  Administered 2016-09-25: 1000 mL via INTRAVENOUS

## 2016-09-25 MED ORDER — CYCLOBENZAPRINE HCL 5 MG PO TABS: 5.0000 mg | ORAL_TABLET | Freq: Three times a day (TID) | ORAL | 0 refills | Status: DC | PRN

## 2016-09-25 NOTE — ED Provider Notes (Signed)
Charles [name] is a 46 y.o. male with a PMHx of HTN, Hepatitis "B", and Aplastic Anemia, who presents to the Emergency Department complaining of constant, aching, 7/10 RUQ abdominal pain onset one week ago. Pt reports a splenic laceration x1 month ago. He was signed out to me at change of shift pending CT Abdomen-Pelvis.   10:15 PM Pt is resting comfortably in bed. On re-examination, his only complaint is right buttock and leg pain. Abdominal pain is resolved. Discussed CT AP.    CT AP Results: 1. There are no definite acute abnormalities visualized 2. Hypodense collection within the mid spleen with additional hypodense fluid collection at the anterior aspect of the spleen, findings are felt related to resolving laceration and perisplenic hematoma status post coil embolization.   Physical Exam  BP (!) 144/95 (BP Location: Right Arm)   Pulse 86   Temp 98.3 F (36.8 C) (Oral)   Resp (!) 24   Ht 5\' 6"  (1.676 m)   Wt 166 lb (75.3 kg)   SpO2 96%   BMI 26.79 kg/m   Physical Exam  Neck: Neck supple.  Abdominal: Soft. There is no rebound and no guarding.  Minimal tenderness on palpation.  Musculoskeletal:       Lumbar back: He exhibits tenderness. He exhibits normal pulse.       Back:  Neurological: He is alert.  Skin: Skin is warm and dry.  Nursing note and vitals reviewed.   Tenderness to palpation of sciatic nerve. 2+ DP pulses.   ED Course  Procedures 46 y.o. male with right sciatic nerve pain and initial complaint of abdominal pain stable for d/c without acute findings on CT scan. Pt case discussed with Dr. Clayborne Dana who agrees with my plan.    By signing my name below, I, Phillips Climes, attest that this documentation has been prepared under the direction and in the presence of [facility] Orlene Och, NP. Electronically Signed: Phillips Climes, Scribe. 09/25/2016. 10:21 PM.    Janne Napoleon, NP 09/26/16 4599    Marily Memos, MD 09/27/16 1130
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
## 2016-09-26 ENCOUNTER — Emergency Department (HOSPITAL_COMMUNITY)
Admission: EM | Admit: 2016-09-26 | Discharge: 2016-09-27 | Disposition: A | Discharge status: Home / Self Care | Payer: Medicare Other | Attending: Emergency Medicine | Admitting: Emergency Medicine

## 2016-09-26 ENCOUNTER — Encounter (HOSPITAL_COMMUNITY): Payer: Self-pay

## 2016-09-26 DIAGNOSIS — I10 Essential (primary) hypertension: Secondary | ICD-10-CM | POA: Diagnosis not present

## 2016-09-26 DIAGNOSIS — F10239 Alcohol dependence with withdrawal, unspecified: Secondary | ICD-10-CM | POA: Diagnosis not present

## 2016-09-26 DIAGNOSIS — F1423 Cocaine dependence with withdrawal: Secondary | ICD-10-CM | POA: Diagnosis present

## 2016-09-26 DIAGNOSIS — Z79899 Other long term (current) drug therapy: Secondary | ICD-10-CM | POA: Insufficient documentation

## 2016-09-26 DIAGNOSIS — F1910 Other psychoactive substance abuse, uncomplicated: Secondary | ICD-10-CM

## 2016-09-26 DIAGNOSIS — F17200 Nicotine dependence, unspecified, uncomplicated: Secondary | ICD-10-CM | POA: Diagnosis not present

## 2016-09-26 MED ORDER — LORAZEPAM 1 MG PO TABS: 0.0000 mg | ORAL_TABLET | Freq: Two times a day (BID) | ORAL | Status: DC

## 2016-09-26 MED ORDER — LORAZEPAM 1 MG PO TABS
1.0000 mg | ORAL_TABLET | Freq: Once | ORAL | Status: AC
  Administered 2016-09-26: 1 mg via ORAL
  Filled 2016-09-26: qty 1

## 2016-09-26 MED ORDER — THIAMINE HCL 100 MG/ML IJ SOLN: 100.0000 mg | Freq: Every day | INTRAMUSCULAR | Status: DC

## 2016-09-26 MED ORDER — LORAZEPAM 1 MG PO TABS: 0.0000 mg | ORAL_TABLET | Freq: Four times a day (QID) | ORAL | Status: DC

## 2016-09-26 MED ORDER — VITAMIN B-1 100 MG PO TABS: 100.0000 mg | ORAL_TABLET | Freq: Every day | ORAL | Status: DC

## 2016-09-26 NOTE — ED Triage Notes (Signed)
Pt presents for detox from cocaine, crack and alcohol.  Reports last used 2 days ago.  Pt denies any SI or HI; is restless in triage; reports he was seen here last night for R buttock and lower leg pain, prescribed flexeril which is not helping.
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
## 2016-09-26 NOTE — ED Notes (Addendum)
Per EDP Madilyn Hook, at anytime throughout the night, pt is free to leave ED if pt desires to do so. Pt has an appointment scheduled with [facility] at 0745 on 09/27/2016. Pt may need assistance getting to facility in the morning, per provider. Pt is not to remain in ED following appointment time with [facility] as ED is not providing any continued treatment for the patient.

## 2016-09-26 NOTE — ED Notes (Signed)
Pt verbalized understanding discharge instructions and denies any further needs or questions at this time. VS stable, ambulatory and steady gait.   

## 2016-09-27 NOTE — ED Notes (Signed)
[facility] transport has been called to transport pt to [facility] at [place] [address] for appointment at 7:45am; Pt has been able to shower and has clean cloths on.

## 2016-10-21 ENCOUNTER — Encounter (HOSPITAL_BASED_OUTPATIENT_CLINIC_OR_DEPARTMENT_OTHER): Payer: Self-pay

## 2016-10-21 ENCOUNTER — Emergency Department (HOSPITAL_BASED_OUTPATIENT_CLINIC_OR_DEPARTMENT_OTHER)
Admission: EM | Admit: 2016-10-21 | Discharge: 2016-10-22 | Disposition: A | Discharge status: Home / Self Care | Payer: Medicare Other | Attending: Emergency Medicine | Admitting: Emergency Medicine

## 2016-10-21 DIAGNOSIS — R079 Chest pain, unspecified: Secondary | ICD-10-CM | POA: Diagnosis not present

## 2016-10-21 DIAGNOSIS — F17200 Nicotine dependence, unspecified, uncomplicated: Secondary | ICD-10-CM | POA: Diagnosis not present

## 2016-10-21 DIAGNOSIS — Z862 Personal history of diseases of the blood and blood-forming organs and certain disorders involving the immune mechanism: Secondary | ICD-10-CM | POA: Diagnosis not present

## 2016-10-21 DIAGNOSIS — R58 Hemorrhage, not elsewhere classified: Secondary | ICD-10-CM | POA: Insufficient documentation

## 2016-10-21 DIAGNOSIS — I10 Essential (primary) hypertension: Secondary | ICD-10-CM | POA: Diagnosis not present

## 2016-10-21 LAB — CBC WITH DIFFERENTIAL/PLATELET
BASOS PCT: 0 %
Basophils Absolute: 0 10*3/uL (ref 0.0–0.1)
EOS ABS: 0 10*3/uL (ref 0.0–0.7)
Eosinophils Relative: 1 %
HEMATOCRIT: 36 % — AB (ref 39.0–52.0)
Hemoglobin: 12.7 g/dL — ABNORMAL LOW (ref 13.0–17.0)
LYMPHS ABS: 1.6 10*3/uL (ref 0.7–4.0)
Lymphocytes Relative: 43 %
MCH: 34.6 pg — AB (ref 26.0–34.0)
MCHC: 35.3 g/dL (ref 30.0–36.0)
MCV: 98.1 fL (ref 78.0–100.0)
Monocytes Absolute: 0.9 10*3/uL (ref 0.1–1.0)
Monocytes Relative: 23 %
NEUTROS ABS: 1.2 10*3/uL — AB (ref 1.7–7.7)
Neutrophils Relative %: 33 %
Platelets: 64 10*3/uL — ABNORMAL LOW (ref 150–400)
RBC: 3.67 MIL/uL — ABNORMAL LOW (ref 4.22–5.81)
RDW: 16 % — AB (ref 11.5–15.5)
Smear Review: DECREASED
WBC: 3.7 10*3/uL — ABNORMAL LOW (ref 4.0–10.5)

## 2016-10-21 LAB — COMPREHENSIVE METABOLIC PANEL
ALT: 31 U/L (ref 17–63)
ANION GAP: 5 (ref 5–15)
AST: 28 U/L (ref 15–41)
Albumin: 3.8 g/dL (ref 3.5–5.0)
Alkaline Phosphatase: 62 U/L (ref 38–126)
BILIRUBIN TOTAL: 0.8 mg/dL (ref 0.3–1.2)
BUN: 11 mg/dL (ref 6–20)
CO2: 27 mmol/L (ref 22–32)
Calcium: 8.9 mg/dL (ref 8.9–10.3)
Chloride: 105 mmol/L (ref 101–111)
Creatinine, Ser: 0.97 mg/dL (ref 0.61–1.24)
Glucose, Bld: 107 mg/dL — ABNORMAL HIGH (ref 65–99)
POTASSIUM: 4.1 mmol/L (ref 3.5–5.1)
Sodium: 137 mmol/L (ref 135–145)
TOTAL PROTEIN: 6.7 g/dL (ref 6.5–8.1)

## 2016-10-21 LAB — LIPASE, BLOOD: LIPASE: 26 U/L (ref 11–51)

## 2016-10-21 NOTE — ED Triage Notes (Addendum)
C/o CP x today-at [facility] x 30 days for cocaine-states feels like same pain he had when his "spleen was bleeding"-NAD-steady gait
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
## 2016-10-22 ENCOUNTER — Emergency Department (HOSPITAL_BASED_OUTPATIENT_CLINIC_OR_DEPARTMENT_OTHER): Payer: Medicare Other

## 2016-10-22 DIAGNOSIS — R079 Chest pain, unspecified: Secondary | ICD-10-CM | POA: Diagnosis not present

## 2016-10-22 DIAGNOSIS — Z862 Personal history of diseases of the blood and blood-forming organs and certain disorders involving the immune mechanism: Secondary | ICD-10-CM | POA: Diagnosis not present

## 2016-10-22 LAB — TROPONIN I
Troponin I: <0.03 ng/mL (ref <0.03)
Troponin I: <0.03 ng/mL (ref <0.03)

## 2016-10-22 MED ORDER — AMLODIPINE BESYLATE 2.5 MG PO TABS: 2.5000 mg | ORAL_TABLET | Freq: Every day | ORAL | 0 refills | Status: DC

## 2016-10-22 MED ORDER — AMLODIPINE BESYLATE 5 MG PO TABS
5.0000 mg | ORAL_TABLET | Freq: Once | ORAL | Status: AC
  Administered 2016-10-22: 5 mg via ORAL
  Filled 2016-10-22: qty 1

## 2016-10-22 MED ORDER — IOPAMIDOL (ISOVUE-300) INJECTION 61%
100.0000 mL | Freq: Once | INTRAVENOUS | Status: AC | PRN
  Administered 2016-10-22: 100 mL via INTRAVENOUS

## 2016-10-22 MED ORDER — SODIUM CHLORIDE 0.9 % IV BOLUS (SEPSIS)
1000.0000 mL | Freq: Once | INTRAVENOUS | Status: AC
  Administered 2016-10-22: 1000 mL via INTRAVENOUS

## 2016-10-22 NOTE — ED Notes (Signed)
Patient transported to CT 

## 2016-10-22 NOTE — ED Notes (Signed)
Pt verbalizes understanding of dc instructions and denies any further needs at this time.  Report given to [facility] staff

## 2017-11-20 ENCOUNTER — Encounter (HOSPITAL_COMMUNITY): Payer: Self-pay

## 2017-11-20 ENCOUNTER — Other Ambulatory Visit: Payer: Self-pay

## 2017-11-20 ENCOUNTER — Emergency Department (HOSPITAL_COMMUNITY)
Admission: EM | Admit: 2017-11-20 | Discharge: 2017-11-20 | Disposition: A | Discharge status: Home / Self Care | Payer: Medicare Other | Attending: Emergency Medicine | Admitting: Emergency Medicine

## 2017-11-20 ENCOUNTER — Emergency Department (HOSPITAL_COMMUNITY): Payer: Medicare Other

## 2017-11-20 DIAGNOSIS — M79604 Pain in right leg: Secondary | ICD-10-CM | POA: Diagnosis present

## 2017-11-20 DIAGNOSIS — M79651 Pain in right thigh: Secondary | ICD-10-CM | POA: Diagnosis not present

## 2017-11-20 DIAGNOSIS — F17200 Nicotine dependence, unspecified, uncomplicated: Secondary | ICD-10-CM | POA: Insufficient documentation

## 2017-11-20 DIAGNOSIS — I10 Essential (primary) hypertension: Secondary | ICD-10-CM

## 2017-11-20 DIAGNOSIS — M779 Enthesopathy, unspecified: Secondary | ICD-10-CM | POA: Diagnosis not present

## 2017-11-20 DIAGNOSIS — M79661 Pain in right lower leg: Secondary | ICD-10-CM | POA: Diagnosis not present

## 2017-11-20 MED ORDER — IBUPROFEN 800 MG PO TABS: 800.0000 mg | ORAL_TABLET | Freq: Three times a day (TID) | ORAL | 0 refills | Status: DC

## 2017-11-20 MED ORDER — KETOROLAC TROMETHAMINE 30 MG/ML IJ SOLN
30.0000 mg | Freq: Once | INTRAMUSCULAR | Status: AC
  Administered 2017-11-20: 30 mg via INTRAMUSCULAR
  Filled 2017-11-20: qty 1

## 2017-11-20 MED ORDER — OXYCODONE-ACETAMINOPHEN 5-325 MG PO TABS
1.0000 | ORAL_TABLET | Freq: Once | ORAL | Status: AC
  Administered 2017-11-20: 1 via ORAL
  Filled 2017-11-20: qty 1

## 2017-11-20 MED ORDER — AMLODIPINE BESYLATE 2.5 MG PO TABS: 2.5000 mg | ORAL_TABLET | Freq: Every day | ORAL | 0 refills | Status: DC

## 2017-11-20 NOTE — ED Notes (Signed)
Gave pt ice pack for knee. Pt also give sprite per PA Hedges.

## 2017-11-20 NOTE — Discharge Instructions (Signed)
Please read attached information. If you experience any new or worsening signs or symptoms please return to the emergency room for evaluation. Please follow-up with your primary care provider or specialist as discussed. Please use medication prescribed only as directed and discontinue taking if you have any concerning signs or symptoms.   °
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
## 2017-12-19 DIAGNOSIS — L409 Psoriasis, unspecified: Secondary | ICD-10-CM | POA: Diagnosis not present

## 2017-12-19 DIAGNOSIS — Z6825 Body mass index (BMI) 25.0-25.9, adult: Secondary | ICD-10-CM | POA: Diagnosis not present

## 2017-12-19 DIAGNOSIS — I10 Essential (primary) hypertension: Secondary | ICD-10-CM | POA: Diagnosis not present

## 2017-12-19 DIAGNOSIS — B162 Acute hepatitis B without delta-agent with hepatic coma: Secondary | ICD-10-CM | POA: Diagnosis not present

## 2018-01-26 DIAGNOSIS — R768 Other specified abnormal immunological findings in serum: Secondary | ICD-10-CM | POA: Diagnosis not present

## 2018-01-26 DIAGNOSIS — B182 Chronic viral hepatitis C: Secondary | ICD-10-CM | POA: Diagnosis not present

## 2018-02-09 ENCOUNTER — Other Ambulatory Visit: Payer: Self-pay | Admitting: Nurse Practitioner

## 2018-02-09 DIAGNOSIS — B182 Chronic viral hepatitis C: Secondary | ICD-10-CM

## 2018-02-20 ENCOUNTER — Ambulatory Visit
Admission: RE | Admit: 2018-02-20 | Discharge: 2018-02-20 | Disposition: A | Discharge status: Home / Self Care | Payer: Medicare Other | Source: Ambulatory Visit | Attending: Nurse Practitioner | Admitting: Nurse Practitioner

## 2018-02-20 DIAGNOSIS — B182 Chronic viral hepatitis C: Secondary | ICD-10-CM | POA: Diagnosis not present

## 2018-04-01 DIAGNOSIS — Z6824 Body mass index (BMI) 24.0-24.9, adult: Secondary | ICD-10-CM | POA: Diagnosis not present

## 2018-04-01 DIAGNOSIS — I10 Essential (primary) hypertension: Secondary | ICD-10-CM | POA: Diagnosis not present

## 2018-07-07 ENCOUNTER — Emergency Department (HOSPITAL_COMMUNITY)
Admission: EM | Admit: 2018-07-07 | Discharge: 2018-07-07 | Disposition: A | Discharge status: Home / Self Care | Payer: Medicare Other | Attending: Emergency Medicine | Admitting: Emergency Medicine

## 2018-07-07 ENCOUNTER — Emergency Department (HOSPITAL_COMMUNITY): Payer: Medicare Other

## 2018-07-07 ENCOUNTER — Encounter (HOSPITAL_COMMUNITY): Payer: Self-pay | Admitting: Emergency Medicine

## 2018-07-07 DIAGNOSIS — Z79899 Other long term (current) drug therapy: Secondary | ICD-10-CM | POA: Insufficient documentation

## 2018-07-07 DIAGNOSIS — M25561 Pain in right knee: Secondary | ICD-10-CM | POA: Diagnosis not present

## 2018-07-07 DIAGNOSIS — I10 Essential (primary) hypertension: Secondary | ICD-10-CM | POA: Diagnosis not present

## 2018-07-07 DIAGNOSIS — M79604 Pain in right leg: Secondary | ICD-10-CM | POA: Diagnosis present

## 2018-07-07 DIAGNOSIS — F17210 Nicotine dependence, cigarettes, uncomplicated: Secondary | ICD-10-CM | POA: Diagnosis not present

## 2018-07-07 MED ORDER — IBUPROFEN 600 MG PO TABS: 600.0000 mg | ORAL_TABLET | Freq: Three times a day (TID) | ORAL | 0 refills | Status: DC | PRN

## 2018-07-07 NOTE — ED Triage Notes (Signed)
Pt reports right leg pain x 2 weeks, states he thinks the rods in it have moved.
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
## 2018-08-19 DIAGNOSIS — I10 Essential (primary) hypertension: Secondary | ICD-10-CM | POA: Diagnosis not present

## 2018-08-19 DIAGNOSIS — D6109 Other constitutional aplastic anemia: Secondary | ICD-10-CM | POA: Diagnosis not present

## 2018-08-19 DIAGNOSIS — M130 Polyarthritis, unspecified: Secondary | ICD-10-CM | POA: Diagnosis not present

## 2019-05-04 IMAGING — US US ABDOMEN COMPLETE W/ ELASTOGRAPHY
1 series · 13 of 25 positions shown · non-contrast
Comparison: None.

CLINICAL DATA: Chronic hepatitis-C without hepatic coma.



[Series 1: us abdomen complete w/ elastography · 0.14mm/px · 13 of 74 slices shown]
[im 1/74]
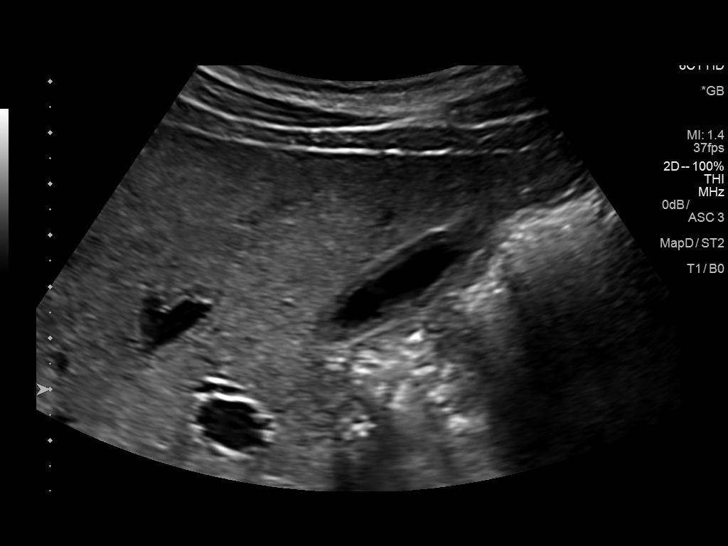
[im 7/74]
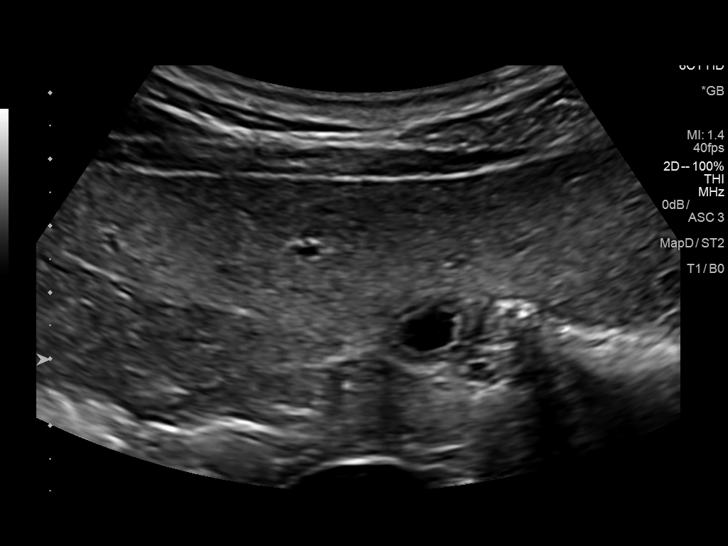
[im 13/74]
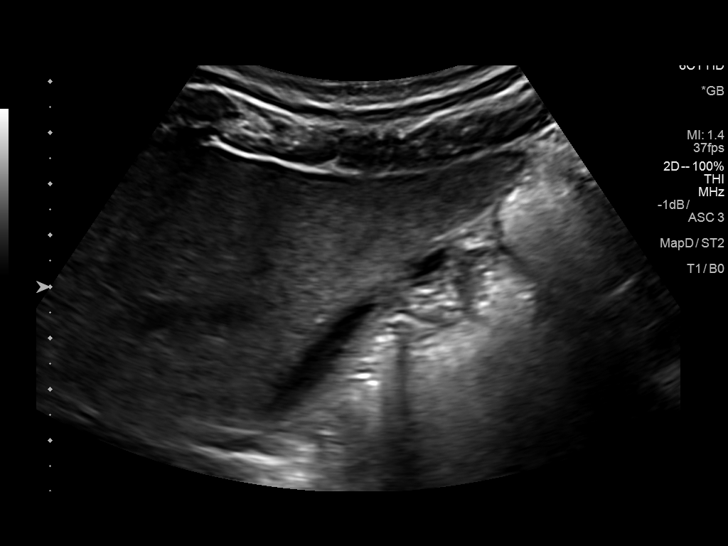
[im 19/74]
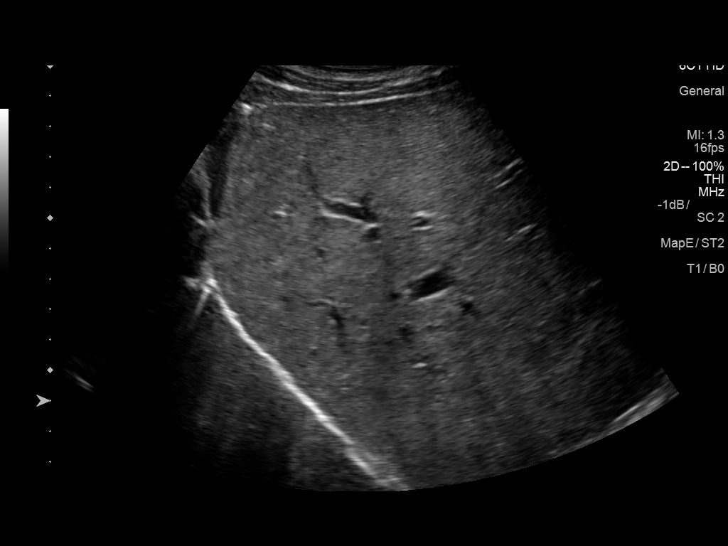
[im 25/74]
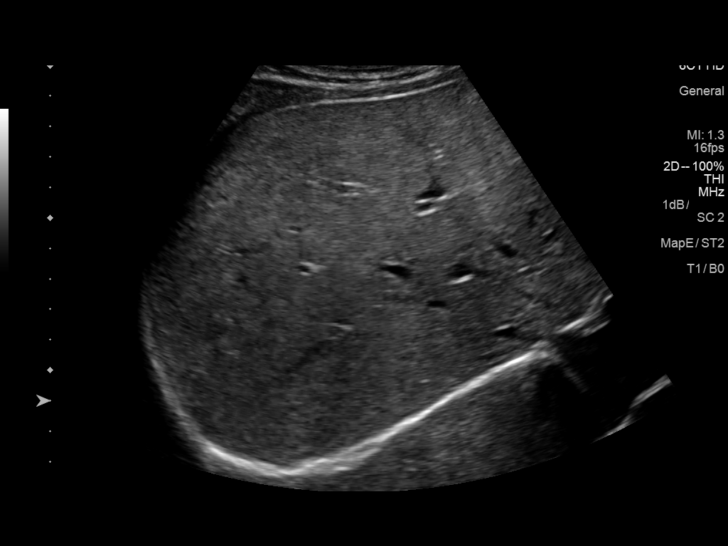
[im 31/74]
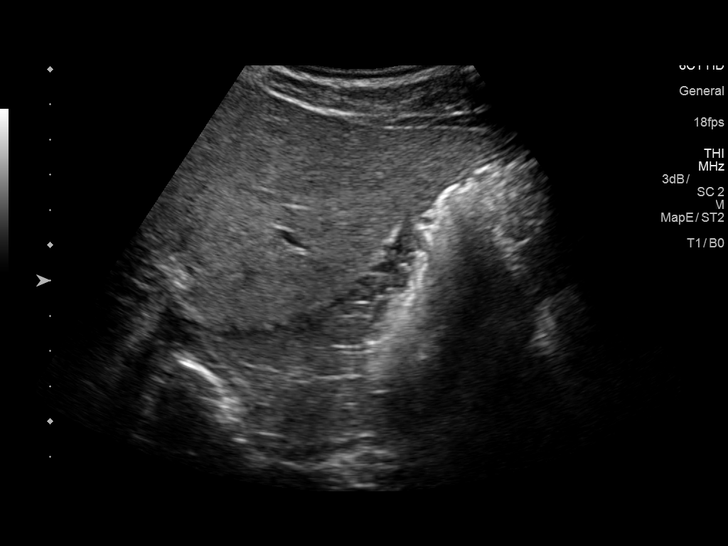
[im 37/74]
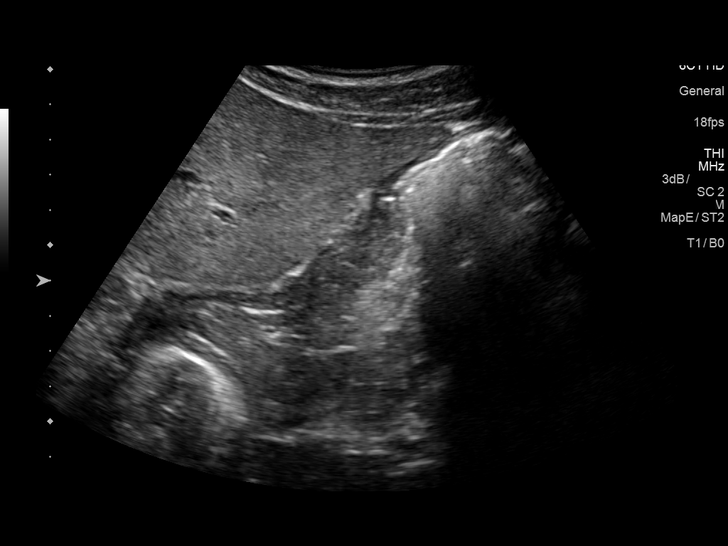
[im 43/74]
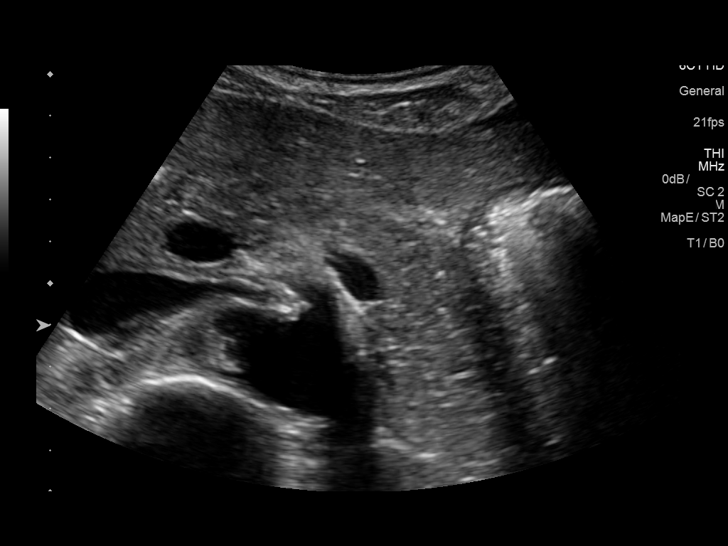
[im 49/74]
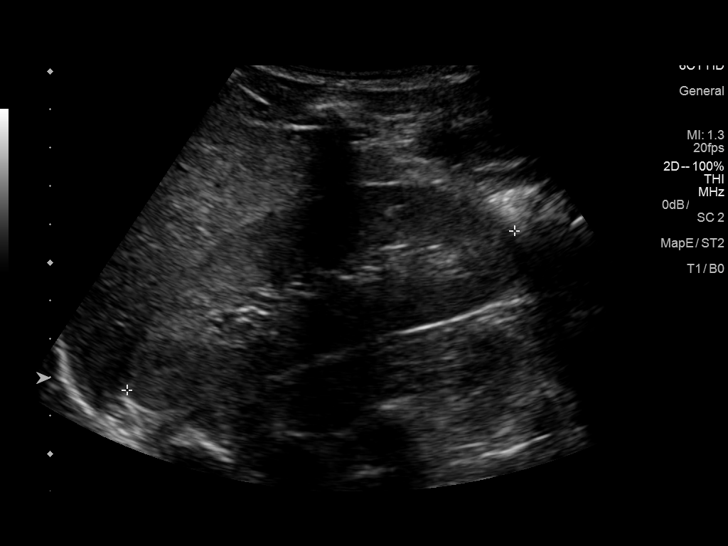
[im 55/74]
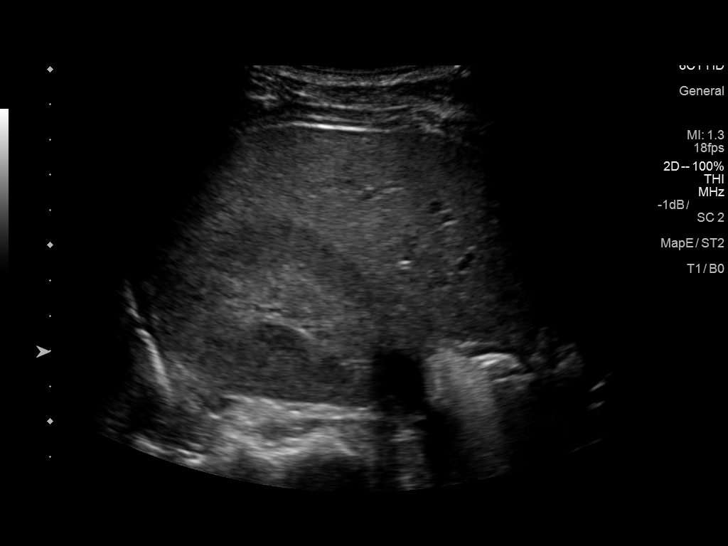
[im 61/74]
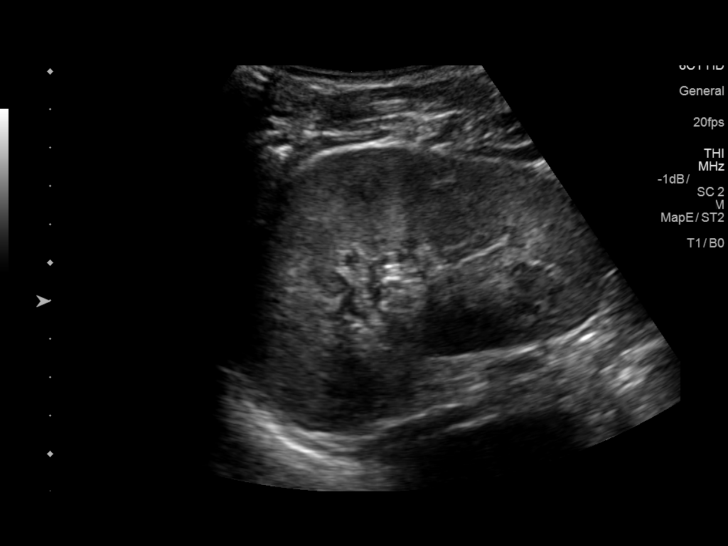
[im 67/74]
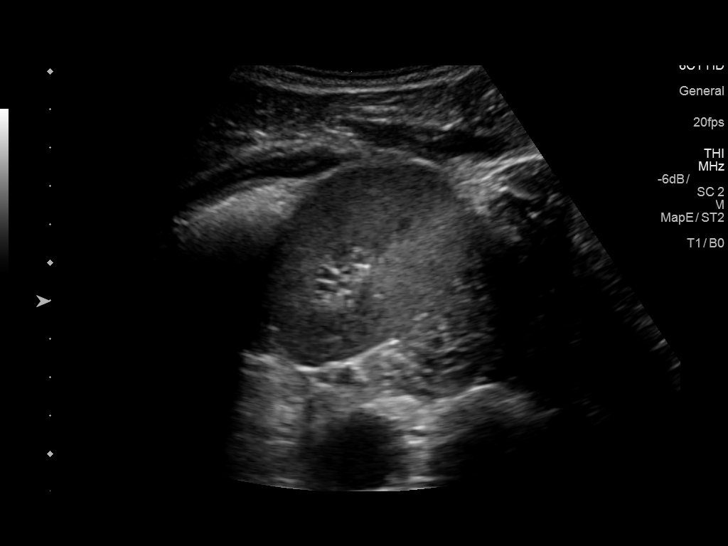
[im 74/74]
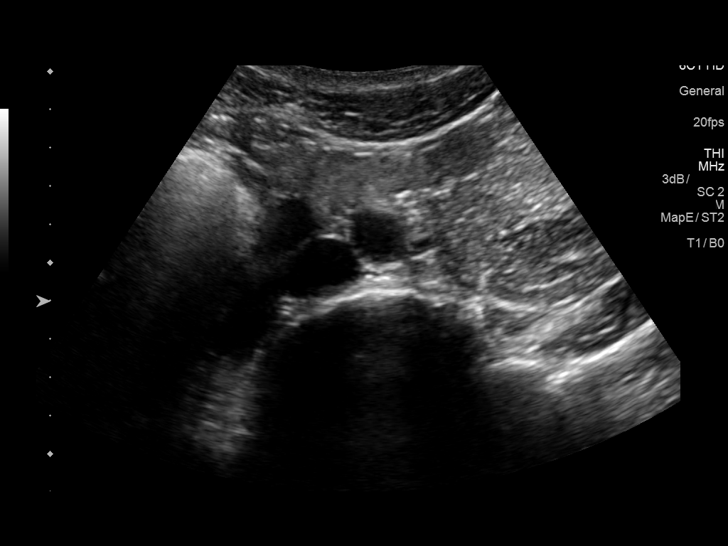

[13 of 25 positions shown; findings below may reference images not displayed]

FINDINGS: ULTRASOUND ABDOMEN

Gallbladder: Incompletely distended gallbladder, however no
gallstones or abnormal wall thickening visualized. No sonographic
Murphy sign noted by sonographer.

Common bile duct: Diameter: 4 mm, within normal limits.

Liver: No focal lesion identified. Within normal limits in
parenchymal echogenicity. Portal vein is patent on color Doppler
imaging with normal direction of blood flow towards the liver.

IVC: No abnormality visualized.

Pancreas: Visualized portion unremarkable.

Spleen: Size and appearance within normal limits.

Right Kidney: Length: 11.0 cm. Echogenicity within normal limits. No
mass or hydronephrosis visualized.

Left Kidney: Length: 10.3 cm. Echogenicity within normal limits. No
mass or hydronephrosis visualized.

Abdominal aorta: No aneurysm visualized.

Other findings: None.

ULTRASOUND HEPATIC ELASTOGRAPHY

Device: Siemens Helix VTQ

Patient position: Oblique

Transducer [id number]

Number of measurements: 10

Hepatic segment:  8

Median velocity:   1.48 m/sec

IQR:

IQR/Median velocity ratio:

Corresponding Metavir fibrosis score:  F2 + some F3

Risk of fibrosis: Moderate

Limitations of exam: None

Please note that abnormal shear wave velocities may also be
identified in clinical settings other than with hepatic fibrosis,
such as: acute hepatitis, elevated right heart and central venous
pressures including use of beta blockers, Jadiel disease
(Parinis), infiltrative processes such as
mastocytosis/amyloidosis/infiltrative tumor, extrahepatic
cholestasis, in the post-prandial state, and liver transplantation.
Correlation with patient history, laboratory data, and clinical
condition recommended.
IMPRESSION: ULTRASOUND ABDOMEN:

Negative. No hepatobiliary or other significant abnormality
identified.

ULTRASOUND HEPATIC ELASTOGRAPHY:

Median hepatic shear wave velocity is calculated at 1.48 m/sec.

Corresponding Metavir fibrosis score is F2 + some F3.

Risk of fibrosis is Moderate.

Follow-up: Additional testing appropriate

## 2019-05-12 ENCOUNTER — Other Ambulatory Visit: Payer: Self-pay

## 2019-05-12 ENCOUNTER — Emergency Department (HOSPITAL_COMMUNITY): Payer: Medicare HMO

## 2019-05-12 ENCOUNTER — Encounter (HOSPITAL_COMMUNITY): Payer: Self-pay | Admitting: Emergency Medicine

## 2019-05-12 ENCOUNTER — Emergency Department (HOSPITAL_COMMUNITY)
Admission: EM | Admit: 2019-05-12 | Discharge: 2019-05-12 | Disposition: A | Discharge status: Home / Self Care | Payer: Medicare HMO | Attending: Emergency Medicine | Admitting: Emergency Medicine

## 2019-05-12 DIAGNOSIS — Z79899 Other long term (current) drug therapy: Secondary | ICD-10-CM | POA: Insufficient documentation

## 2019-05-12 DIAGNOSIS — F1410 Cocaine abuse, uncomplicated: Secondary | ICD-10-CM | POA: Insufficient documentation

## 2019-05-12 DIAGNOSIS — M25561 Pain in right knee: Secondary | ICD-10-CM | POA: Insufficient documentation

## 2019-05-12 DIAGNOSIS — R1012 Left upper quadrant pain: Secondary | ICD-10-CM | POA: Insufficient documentation

## 2019-05-12 DIAGNOSIS — I10 Essential (primary) hypertension: Secondary | ICD-10-CM | POA: Diagnosis not present

## 2019-05-12 DIAGNOSIS — R109 Unspecified abdominal pain: Secondary | ICD-10-CM | POA: Diagnosis not present

## 2019-05-12 DIAGNOSIS — F17210 Nicotine dependence, cigarettes, uncomplicated: Secondary | ICD-10-CM | POA: Insufficient documentation

## 2019-05-12 LAB — URINALYSIS, ROUTINE W REFLEX MICROSCOPIC
Bilirubin Urine: NEGATIVE
Glucose, UA: NEGATIVE mg/dL
Hgb urine dipstick: NEGATIVE
Ketones, ur: NEGATIVE mg/dL
Leukocytes,Ua: NEGATIVE
Nitrite: NEGATIVE
Protein, ur: NEGATIVE mg/dL
Specific Gravity, Urine: 1.021 (ref 1.005–1.030)
pH: 6 (ref 5.0–8.0)

## 2019-05-12 LAB — CBC
HCT: 40.5 % (ref 39.0–52.0)
Hemoglobin: 13.6 g/dL (ref 13.0–17.0)
MCH: 35.4 pg — ABNORMAL HIGH (ref 26.0–34.0)
MCHC: 33.6 g/dL (ref 30.0–36.0)
MCV: 105.5 fL — ABNORMAL HIGH (ref 80.0–100.0)
Platelets: DECREASED 10*3/uL (ref 150–400)
RBC: 3.84 MIL/uL — ABNORMAL LOW (ref 4.22–5.81)
RDW: 16.5 % — ABNORMAL HIGH (ref 11.5–15.5)
WBC: 2.9 10*3/uL — ABNORMAL LOW (ref 4.0–10.5)
nRBC: 0 % (ref 0.0–0.2)

## 2019-05-12 LAB — COMPREHENSIVE METABOLIC PANEL
ALT: 28 U/L (ref 0–44)
AST: 54 U/L — ABNORMAL HIGH (ref 15–41)
Albumin: 3.7 g/dL (ref 3.5–5.0)
Alkaline Phosphatase: 59 U/L (ref 38–126)
Anion gap: 8 (ref 5–15)
BUN: 20 mg/dL (ref 6–20)
CO2: 25 mmol/L (ref 22–32)
Calcium: 8.8 mg/dL — ABNORMAL LOW (ref 8.9–10.3)
Chloride: 108 mmol/L (ref 98–111)
Creatinine, Ser: 1.46 mg/dL — ABNORMAL HIGH (ref 0.61–1.24)
GFR calc Af Amer: >60 mL/min (ref >60)
GFR calc non Af Amer: 56 mL/min — ABNORMAL LOW (ref >60)
Glucose, Bld: 90 mg/dL (ref 70–99)
Potassium: 4.3 mmol/L (ref 3.5–5.1)
Sodium: 141 mmol/L (ref 135–145)
Total Bilirubin: 1.1 mg/dL (ref 0.3–1.2)
Total Protein: 7.2 g/dL (ref 6.5–8.1)

## 2019-05-12 LAB — LIPASE, BLOOD: Lipase: 26 U/L (ref 11–51)

## 2019-05-12 MED ORDER — SODIUM CHLORIDE 0.9 % IV BOLUS
500.0000 mL | Freq: Once | INTRAVENOUS | Status: AC
  Administered 2019-05-12: 500 mL via INTRAVENOUS

## 2019-05-12 MED ORDER — IOHEXOL 300 MG/ML  SOLN
100.0000 mL | Freq: Once | INTRAMUSCULAR | Status: AC | PRN
  Administered 2019-05-12: 100 mL via INTRAVENOUS

## 2019-05-12 MED ORDER — OXYCODONE HCL 5 MG PO TABS
5.0000 mg | ORAL_TABLET | Freq: Once | ORAL | Status: AC
  Administered 2019-05-12: 5 mg via ORAL
  Filled 2019-05-12: qty 1

## 2019-05-12 NOTE — ED Triage Notes (Signed)
Pt reports right knee pain swelling for 3 weeks. Pt also endorses upper abd/under rib pain since yesterday. Denies N/VD.
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
## 2019-05-12 NOTE — ED Notes (Signed)
Pt becoming agitated and stating "im getting frustrated and want my pain medicine"

## 2019-05-12 NOTE — ED Provider Notes (Signed)
Attempted to see patient 1700.  Not present in hallway bed.   Ezequiel Essex, MD 05/12/19 [phone]

## 2019-05-20 ENCOUNTER — Emergency Department (HOSPITAL_COMMUNITY)
Admission: EM | Admit: 2019-05-20 | Discharge: 2019-05-21 | Disposition: A | Discharge status: Home / Self Care | Payer: Medicare HMO | Attending: Emergency Medicine | Admitting: Emergency Medicine

## 2019-05-20 ENCOUNTER — Encounter (HOSPITAL_COMMUNITY): Payer: Self-pay | Admitting: Emergency Medicine

## 2019-05-20 ENCOUNTER — Other Ambulatory Visit: Payer: Self-pay

## 2019-05-20 ENCOUNTER — Emergency Department (HOSPITAL_COMMUNITY): Payer: Medicare HMO

## 2019-05-20 DIAGNOSIS — K0889 Other specified disorders of teeth and supporting structures: Secondary | ICD-10-CM | POA: Diagnosis not present

## 2019-05-20 DIAGNOSIS — F1410 Cocaine abuse, uncomplicated: Secondary | ICD-10-CM | POA: Diagnosis not present

## 2019-05-20 DIAGNOSIS — M1711 Unilateral primary osteoarthritis, right knee: Secondary | ICD-10-CM | POA: Diagnosis not present

## 2019-05-20 DIAGNOSIS — F1210 Cannabis abuse, uncomplicated: Secondary | ICD-10-CM | POA: Insufficient documentation

## 2019-05-20 DIAGNOSIS — R0789 Other chest pain: Secondary | ICD-10-CM | POA: Diagnosis not present

## 2019-05-20 DIAGNOSIS — F17210 Nicotine dependence, cigarettes, uncomplicated: Secondary | ICD-10-CM | POA: Insufficient documentation

## 2019-05-20 DIAGNOSIS — I10 Essential (primary) hypertension: Secondary | ICD-10-CM | POA: Insufficient documentation

## 2019-05-20 DIAGNOSIS — R079 Chest pain, unspecified: Secondary | ICD-10-CM | POA: Diagnosis not present

## 2019-05-20 DIAGNOSIS — M25561 Pain in right knee: Secondary | ICD-10-CM | POA: Diagnosis not present

## 2019-05-20 DIAGNOSIS — M25461 Effusion, right knee: Secondary | ICD-10-CM | POA: Diagnosis not present

## 2019-05-20 NOTE — ED Triage Notes (Signed)
Patient arrived with EMS from home reports persistent right knee pain for several days " popping out of place" denies recent injury/ambulatory , patient added hypertension with no medication for several months .

## 2019-05-21 MED ORDER — PREDNISONE 10 MG PO TABS: 20.0000 mg | ORAL_TABLET | Freq: Two times a day (BID) | ORAL | 0 refills | Status: DC

## 2019-05-21 NOTE — Discharge Instructions (Addendum)
Begin taking prednisone as prescribed.  Follow-up with your primary doctor if symptoms or not improving in the next 1 to 2 weeks.
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
## 2019-06-21 ENCOUNTER — Inpatient Hospital Stay (HOSPITAL_COMMUNITY)
Admission: EM | Admit: 2019-06-21 | Discharge: 2019-06-24 | DRG: 177 | Disposition: A | Discharge status: Home / Self Care | Payer: Medicare HMO | Attending: Internal Medicine | Admitting: Internal Medicine

## 2019-06-21 ENCOUNTER — Encounter (HOSPITAL_COMMUNITY): Payer: Self-pay | Admitting: Emergency Medicine

## 2019-06-21 ENCOUNTER — Emergency Department (HOSPITAL_COMMUNITY): Payer: Medicare HMO

## 2019-06-21 DIAGNOSIS — K1120 Sialoadenitis, unspecified: Secondary | ICD-10-CM | POA: Diagnosis present

## 2019-06-21 DIAGNOSIS — D696 Thrombocytopenia, unspecified: Secondary | ICD-10-CM | POA: Diagnosis present

## 2019-06-21 DIAGNOSIS — R918 Other nonspecific abnormal finding of lung field: Secondary | ICD-10-CM | POA: Diagnosis not present

## 2019-06-21 DIAGNOSIS — F1410 Cocaine abuse, uncomplicated: Secondary | ICD-10-CM | POA: Diagnosis present

## 2019-06-21 DIAGNOSIS — J9601 Acute respiratory failure with hypoxia: Secondary | ICD-10-CM | POA: Diagnosis present

## 2019-06-21 DIAGNOSIS — M19072 Primary osteoarthritis, left ankle and foot: Secondary | ICD-10-CM | POA: Diagnosis present

## 2019-06-21 DIAGNOSIS — F17210 Nicotine dependence, cigarettes, uncomplicated: Secondary | ICD-10-CM | POA: Diagnosis present

## 2019-06-21 DIAGNOSIS — B1910 Unspecified viral hepatitis B without hepatic coma: Secondary | ICD-10-CM | POA: Diagnosis present

## 2019-06-21 DIAGNOSIS — J189 Pneumonia, unspecified organism: Secondary | ICD-10-CM | POA: Diagnosis not present

## 2019-06-21 DIAGNOSIS — D619 Aplastic anemia, unspecified: Secondary | ICD-10-CM | POA: Diagnosis present

## 2019-06-21 DIAGNOSIS — Z91012 Allergy to eggs: Secondary | ICD-10-CM

## 2019-06-21 DIAGNOSIS — J690 Pneumonitis due to inhalation of food and vomit: Secondary | ICD-10-CM | POA: Diagnosis present

## 2019-06-21 DIAGNOSIS — I10 Essential (primary) hypertension: Secondary | ICD-10-CM | POA: Diagnosis present

## 2019-06-21 DIAGNOSIS — K0889 Other specified disorders of teeth and supporting structures: Secondary | ICD-10-CM | POA: Diagnosis not present

## 2019-06-21 DIAGNOSIS — E876 Hypokalemia: Secondary | ICD-10-CM | POA: Diagnosis present

## 2019-06-21 DIAGNOSIS — R079 Chest pain, unspecified: Secondary | ICD-10-CM | POA: Diagnosis not present

## 2019-06-21 DIAGNOSIS — U071 COVID-19: Secondary | ICD-10-CM | POA: Diagnosis present

## 2019-06-21 DIAGNOSIS — Z91018 Allergy to other foods: Secondary | ICD-10-CM | POA: Diagnosis not present

## 2019-06-21 DIAGNOSIS — J1282 Pneumonia due to coronavirus disease 2019: Secondary | ICD-10-CM | POA: Diagnosis present

## 2019-06-21 DIAGNOSIS — G4730 Sleep apnea, unspecified: Secondary | ICD-10-CM | POA: Diagnosis present

## 2019-06-21 DIAGNOSIS — R0902 Hypoxemia: Secondary | ICD-10-CM | POA: Diagnosis not present

## 2019-06-21 DIAGNOSIS — Z590 Homelessness: Secondary | ICD-10-CM | POA: Diagnosis not present

## 2019-06-21 DIAGNOSIS — R5383 Other fatigue: Secondary | ICD-10-CM | POA: Diagnosis not present

## 2019-06-21 DIAGNOSIS — R52 Pain, unspecified: Secondary | ICD-10-CM | POA: Diagnosis not present

## 2019-06-21 DIAGNOSIS — R0602 Shortness of breath: Secondary | ICD-10-CM | POA: Diagnosis present

## 2019-06-21 DIAGNOSIS — R42 Dizziness and giddiness: Secondary | ICD-10-CM | POA: Diagnosis not present

## 2019-06-21 DIAGNOSIS — J9602 Acute respiratory failure with hypercapnia: Secondary | ICD-10-CM | POA: Diagnosis present

## 2019-06-21 DIAGNOSIS — R221 Localized swelling, mass and lump, neck: Secondary | ICD-10-CM | POA: Diagnosis not present

## 2019-06-21 LAB — CBC
HCT: 34.8 % — ABNORMAL LOW (ref 39.0–52.0)
Hemoglobin: 11.7 g/dL — ABNORMAL LOW (ref 13.0–17.0)
MCH: 35 pg — ABNORMAL HIGH (ref 26.0–34.0)
MCHC: 33.6 g/dL (ref 30.0–36.0)
MCV: 104.2 fL — ABNORMAL HIGH (ref 80.0–100.0)
Platelets: DECREASED 10*3/uL (ref 150–400)
RBC: 3.34 MIL/uL — ABNORMAL LOW (ref 4.22–5.81)
RDW: 16.9 % — ABNORMAL HIGH (ref 11.5–15.5)
WBC: 5.4 10*3/uL (ref 4.0–10.5)
nRBC: 0 % (ref 0.0–0.2)

## 2019-06-21 LAB — BASIC METABOLIC PANEL
Anion gap: 13 (ref 5–15)
BUN: 9 mg/dL (ref 6–20)
CO2: 24 mmol/L (ref 22–32)
Calcium: 8.5 mg/dL — ABNORMAL LOW (ref 8.9–10.3)
Chloride: 100 mmol/L (ref 98–111)
Creatinine, Ser: 1.12 mg/dL (ref 0.61–1.24)
GFR calc Af Amer: >60 mL/min (ref >60)
GFR calc non Af Amer: >60 mL/min (ref >60)
Glucose, Bld: 88 mg/dL (ref 70–99)
Potassium: 3.3 mmol/L — ABNORMAL LOW (ref 3.5–5.1)
Sodium: 137 mmol/L (ref 135–145)

## 2019-06-21 LAB — TROPONIN I (HIGH SENSITIVITY): Troponin I (High Sensitivity): 29 ng/L — ABNORMAL HIGH (ref <18)

## 2019-06-21 MED ORDER — SODIUM CHLORIDE 0.9% FLUSH
3.0000 mL | Freq: Once | INTRAVENOUS | Status: AC
  Administered 2019-06-22: 3 mL via INTRAVENOUS

## 2019-06-21 NOTE — ED Notes (Signed)
Pt verbally aggressive with staff. Refusing to answer assessment questions at this time. Pt O2 saturation 88% on room air. Pt placed on 2L [place] and O2 improved to 94%.

## 2019-06-21 NOTE — ED Triage Notes (Signed)
BIB EMS from home. Presents with multiple complaints. States he has CP/SOB, dental pain, body aches X several days. Low grade fever in triage.

## 2019-06-22 ENCOUNTER — Emergency Department (HOSPITAL_COMMUNITY): Payer: Medicare HMO

## 2019-06-22 ENCOUNTER — Other Ambulatory Visit: Payer: Self-pay

## 2019-06-22 ENCOUNTER — Encounter (HOSPITAL_COMMUNITY): Payer: Self-pay | Admitting: Family Medicine

## 2019-06-22 ENCOUNTER — Inpatient Hospital Stay (HOSPITAL_COMMUNITY): Payer: Medicare HMO

## 2019-06-22 DIAGNOSIS — K1120 Sialoadenitis, unspecified: Secondary | ICD-10-CM | POA: Diagnosis present

## 2019-06-22 DIAGNOSIS — R0602 Shortness of breath: Secondary | ICD-10-CM | POA: Diagnosis present

## 2019-06-22 DIAGNOSIS — F1410 Cocaine abuse, uncomplicated: Secondary | ICD-10-CM | POA: Diagnosis present

## 2019-06-22 DIAGNOSIS — Z91018 Allergy to other foods: Secondary | ICD-10-CM | POA: Diagnosis not present

## 2019-06-22 DIAGNOSIS — Z590 Homelessness: Secondary | ICD-10-CM | POA: Diagnosis not present

## 2019-06-22 DIAGNOSIS — E876 Hypokalemia: Secondary | ICD-10-CM | POA: Diagnosis present

## 2019-06-22 DIAGNOSIS — J189 Pneumonia, unspecified organism: Secondary | ICD-10-CM | POA: Diagnosis not present

## 2019-06-22 DIAGNOSIS — J1282 Pneumonia due to coronavirus disease 2019: Secondary | ICD-10-CM | POA: Diagnosis present

## 2019-06-22 DIAGNOSIS — U071 COVID-19: Secondary | ICD-10-CM | POA: Diagnosis present

## 2019-06-22 DIAGNOSIS — D619 Aplastic anemia, unspecified: Secondary | ICD-10-CM | POA: Diagnosis present

## 2019-06-22 DIAGNOSIS — D696 Thrombocytopenia, unspecified: Secondary | ICD-10-CM

## 2019-06-22 DIAGNOSIS — I10 Essential (primary) hypertension: Secondary | ICD-10-CM | POA: Diagnosis present

## 2019-06-22 DIAGNOSIS — B1910 Unspecified viral hepatitis B without hepatic coma: Secondary | ICD-10-CM | POA: Diagnosis present

## 2019-06-22 DIAGNOSIS — G4730 Sleep apnea, unspecified: Secondary | ICD-10-CM | POA: Diagnosis present

## 2019-06-22 DIAGNOSIS — J690 Pneumonitis due to inhalation of food and vomit: Secondary | ICD-10-CM | POA: Diagnosis present

## 2019-06-22 DIAGNOSIS — F17210 Nicotine dependence, cigarettes, uncomplicated: Secondary | ICD-10-CM | POA: Diagnosis present

## 2019-06-22 DIAGNOSIS — J9601 Acute respiratory failure with hypoxia: Secondary | ICD-10-CM | POA: Diagnosis present

## 2019-06-22 DIAGNOSIS — M19072 Primary osteoarthritis, left ankle and foot: Secondary | ICD-10-CM | POA: Diagnosis present

## 2019-06-22 DIAGNOSIS — Z91012 Allergy to eggs: Secondary | ICD-10-CM | POA: Diagnosis not present

## 2019-06-22 LAB — CBC WITH DIFFERENTIAL/PLATELET
Abs Immature Granulocytes: 0.02 10*3/uL (ref 0.00–0.07)
Basophils Absolute: 0 10*3/uL (ref 0.0–0.1)
Basophils Relative: 0 %
Eosinophils Absolute: 0 10*3/uL (ref 0.0–0.5)
Eosinophils Relative: 0 %
HCT: 32.2 % — ABNORMAL LOW (ref 39.0–52.0)
Hemoglobin: 10.7 g/dL — ABNORMAL LOW (ref 13.0–17.0)
Immature Granulocytes: 0 %
Lymphocytes Relative: 21 %
Lymphs Abs: 1 10*3/uL (ref 0.7–4.0)
MCH: 34.6 pg — ABNORMAL HIGH (ref 26.0–34.0)
MCHC: 33.2 g/dL (ref 30.0–36.0)
MCV: 104.2 fL — ABNORMAL HIGH (ref 80.0–100.0)
Monocytes Absolute: 0.5 10*3/uL (ref 0.1–1.0)
Monocytes Relative: 11 %
Neutro Abs: 3.1 10*3/uL (ref 1.7–7.7)
Neutrophils Relative %: 68 %
Platelets: 79 10*3/uL — ABNORMAL LOW (ref 150–400)
RBC: 3.09 MIL/uL — ABNORMAL LOW (ref 4.22–5.81)
RDW: 16.6 % — ABNORMAL HIGH (ref 11.5–15.5)
WBC: 4.6 10*3/uL (ref 4.0–10.5)
nRBC: 0 % (ref 0.0–0.2)

## 2019-06-22 LAB — RAPID URINE DRUG SCREEN, HOSP PERFORMED
Amphetamines: NOT DETECTED
Barbiturates: NOT DETECTED
Benzodiazepines: NOT DETECTED
Cocaine: POSITIVE — AB
Opiates: NOT DETECTED
Tetrahydrocannabinol: POSITIVE — AB

## 2019-06-22 LAB — BASIC METABOLIC PANEL
Anion gap: 10 (ref 5–15)
BUN: 12 mg/dL (ref 6–20)
CO2: 27 mmol/L (ref 22–32)
Calcium: 7.9 mg/dL — ABNORMAL LOW (ref 8.9–10.3)
Chloride: 99 mmol/L (ref 98–111)
Creatinine, Ser: 1.24 mg/dL (ref 0.61–1.24)
GFR calc Af Amer: >60 mL/min (ref >60)
GFR calc non Af Amer: >60 mL/min (ref >60)
Glucose, Bld: 145 mg/dL — ABNORMAL HIGH (ref 70–99)
Potassium: 3 mmol/L — ABNORMAL LOW (ref 3.5–5.1)
Sodium: 136 mmol/L (ref 135–145)

## 2019-06-22 LAB — POC SARS CORONAVIRUS 2 AG -  ED: SARS Coronavirus 2 Ag: NEGATIVE

## 2019-06-22 LAB — SARS CORONAVIRUS 2 (TAT 6-24 HRS): SARS Coronavirus 2: POSITIVE — AB

## 2019-06-22 LAB — PROCALCITONIN: Procalcitonin: 0.22 ng/mL

## 2019-06-22 LAB — STREP PNEUMONIAE URINARY ANTIGEN: Strep Pneumo Urinary Antigen: NEGATIVE

## 2019-06-22 LAB — TROPONIN I (HIGH SENSITIVITY): Troponin I (High Sensitivity): 35 ng/L — ABNORMAL HIGH (ref <18)

## 2019-06-22 LAB — HIV ANTIBODY (ROUTINE TESTING W REFLEX): HIV Screen 4th Generation wRfx: NONREACTIVE

## 2019-06-22 MED ORDER — SODIUM CHLORIDE 0.9 % IV SOLN
2.0000 g | Freq: Once | INTRAVENOUS | Status: AC
  Administered 2019-06-22: 2 g via INTRAVENOUS
  Filled 2019-06-22: qty 20

## 2019-06-22 MED ORDER — ASCORBIC ACID 500 MG PO TABS
500.0000 mg | ORAL_TABLET | Freq: Every day | ORAL | Status: DC
  Administered 2019-06-22 – 2019-06-24 (×3): 500 mg via ORAL
  Filled 2019-06-22 (×3): qty 1

## 2019-06-22 MED ORDER — SODIUM CHLORIDE 0.9 % IV SOLN: 500.0000 mg | INTRAVENOUS | Status: DC

## 2019-06-22 MED ORDER — SODIUM CHLORIDE 0.9 % IV SOLN
2.0000 g | INTRAVENOUS | Status: DC
  Administered 2019-06-23: 2 g via INTRAVENOUS
  Filled 2019-06-22: qty 20

## 2019-06-22 MED ORDER — CLINDAMYCIN HCL 300 MG PO CAPS
300.0000 mg | ORAL_CAPSULE | Freq: Four times a day (QID) | ORAL | Status: DC
  Administered 2019-06-22 – 2019-06-24 (×7): 300 mg via ORAL
  Filled 2019-06-22 (×13): qty 1

## 2019-06-22 MED ORDER — ONDANSETRON HCL 4 MG/2ML IJ SOLN: 4.0000 mg | Freq: Four times a day (QID) | INTRAMUSCULAR | Status: DC | PRN

## 2019-06-22 MED ORDER — DEXAMETHASONE 6 MG PO TABS
6.0000 mg | ORAL_TABLET | ORAL | Status: DC
  Administered 2019-06-22 – 2019-06-24 (×3): 6 mg via ORAL
  Filled 2019-06-22 (×2): qty 1
  Filled 2019-06-22: qty 2

## 2019-06-22 MED ORDER — ONDANSETRON HCL 4 MG PO TABS: 4.0000 mg | ORAL_TABLET | Freq: Four times a day (QID) | ORAL | Status: DC | PRN

## 2019-06-22 MED ORDER — SODIUM CHLORIDE 0.9% FLUSH
3.0000 mL | Freq: Two times a day (BID) | INTRAVENOUS | Status: DC
  Administered 2019-06-22 – 2019-06-24 (×4): 3 mL via INTRAVENOUS

## 2019-06-22 MED ORDER — ACETAMINOPHEN 650 MG RE SUPP: 650.0000 mg | Freq: Four times a day (QID) | RECTAL | Status: DC | PRN

## 2019-06-22 MED ORDER — IOHEXOL 350 MG/ML SOLN
100.0000 mL | Freq: Once | INTRAVENOUS | Status: AC | PRN
  Administered 2019-06-22: 100 mL via INTRAVENOUS

## 2019-06-22 MED ORDER — SODIUM CHLORIDE 0.9 % IV SOLN
200.0000 mg | Freq: Once | INTRAVENOUS | Status: AC
  Administered 2019-06-22: 200 mg via INTRAVENOUS
  Filled 2019-06-22: qty 40

## 2019-06-22 MED ORDER — POTASSIUM CHLORIDE CRYS ER 20 MEQ PO TBCR
40.0000 meq | EXTENDED_RELEASE_TABLET | Freq: Once | ORAL | Status: AC
  Administered 2019-06-22: 40 meq via ORAL
  Filled 2019-06-22: qty 2

## 2019-06-22 MED ORDER — KETOROLAC TROMETHAMINE 60 MG/2ML IM SOLN
15.0000 mg | Freq: Once | INTRAMUSCULAR | Status: AC
  Administered 2019-06-22: 15 mg via INTRAMUSCULAR
  Filled 2019-06-22: qty 2

## 2019-06-22 MED ORDER — ADULT MULTIVITAMIN W/MINERALS CH
1.0000 | ORAL_TABLET | Freq: Every day | ORAL | Status: DC
  Administered 2019-06-22 – 2019-06-24 (×3): 1 via ORAL
  Filled 2019-06-22 (×2): qty 1

## 2019-06-22 MED ORDER — ZINC SULFATE 220 (50 ZN) MG PO CAPS
220.0000 mg | ORAL_CAPSULE | Freq: Every day | ORAL | Status: DC
  Administered 2019-06-22 – 2019-06-24 (×3): 220 mg via ORAL
  Filled 2019-06-22 (×3): qty 1

## 2019-06-22 MED ORDER — SODIUM CHLORIDE 0.9% FLUSH: 3.0000 mL | INTRAVENOUS | Status: DC | PRN

## 2019-06-22 MED ORDER — ACETAMINOPHEN 325 MG PO TABS: 650.0000 mg | ORAL_TABLET | Freq: Four times a day (QID) | ORAL | Status: DC | PRN

## 2019-06-22 MED ORDER — FOLIC ACID 1 MG PO TABS
1.0000 mg | ORAL_TABLET | Freq: Every day | ORAL | Status: DC
  Administered 2019-06-22 – 2019-06-24 (×3): 1 mg via ORAL
  Filled 2019-06-22 (×2): qty 1

## 2019-06-22 MED ORDER — IPRATROPIUM-ALBUTEROL 20-100 MCG/ACT IN AERS
1.0000 | INHALATION_SPRAY | Freq: Four times a day (QID) | RESPIRATORY_TRACT | Status: DC
  Administered 2019-06-22 – 2019-06-24 (×7): 1 via RESPIRATORY_TRACT
  Filled 2019-06-22: qty 4

## 2019-06-22 MED ORDER — THIAMINE HCL 100 MG PO TABS
100.0000 mg | ORAL_TABLET | Freq: Every day | ORAL | Status: DC
  Administered 2019-06-22 – 2019-06-24 (×3): 100 mg via ORAL
  Filled 2019-06-22 (×2): qty 1

## 2019-06-22 MED ORDER — HYDROCODONE-ACETAMINOPHEN 5-325 MG PO TABS
1.0000 | ORAL_TABLET | ORAL | Status: DC | PRN
  Administered 2019-06-22 – 2019-06-23 (×3): 2 via ORAL
  Filled 2019-06-22 (×3): qty 2

## 2019-06-22 MED ORDER — SODIUM CHLORIDE 0.9 % IV SOLN: 250.0000 mL | INTRAVENOUS | Status: DC | PRN

## 2019-06-22 MED ORDER — SODIUM CHLORIDE 0.9% FLUSH
3.0000 mL | Freq: Two times a day (BID) | INTRAVENOUS | Status: DC
  Administered 2019-06-23 (×2): 3 mL via INTRAVENOUS

## 2019-06-22 MED ORDER — SODIUM CHLORIDE 0.9 % IV SOLN
500.0000 mg | Freq: Once | INTRAVENOUS | Status: AC
  Administered 2019-06-22: 500 mg via INTRAVENOUS
  Filled 2019-06-22: qty 500

## 2019-06-22 MED ORDER — SENNOSIDES-DOCUSATE SODIUM 8.6-50 MG PO TABS: 1.0000 | ORAL_TABLET | Freq: Every evening | ORAL | Status: DC | PRN

## 2019-06-22 MED ORDER — IOHEXOL 300 MG/ML  SOLN
75.0000 mL | Freq: Once | INTRAMUSCULAR | Status: AC | PRN
  Administered 2019-06-22: 75 mL via INTRAVENOUS

## 2019-06-22 MED ORDER — SODIUM CHLORIDE 0.9 % IV SOLN
100.0000 mg | Freq: Two times a day (BID) | INTRAVENOUS | Status: DC
  Administered 2019-06-22: 100 mg via INTRAVENOUS
  Filled 2019-06-22 (×2): qty 100

## 2019-06-22 MED ORDER — SODIUM CHLORIDE 0.9 % IV SOLN
100.0000 mg | Freq: Every day | INTRAVENOUS | Status: DC
  Administered 2019-06-23 – 2019-06-24 (×2): 100 mg via INTRAVENOUS
  Filled 2019-06-22 (×2): qty 20
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
## 2019-06-22 NOTE — ED Notes (Signed)
Pt given sandwich and drink. Pt complaining of dental pain and requested ice. Pt given ice.

## 2019-06-22 NOTE — ED Notes (Signed)
Care endorsed to Hilary, RN 

## 2019-06-22 NOTE — Progress Notes (Signed)
Patient being transferred to [facility] hospital on oral antibiotics and 2/5 remdesivir. Pt AOx4, IV in R arm. Report called to RN at [facility].

## 2019-06-22 NOTE — ED Notes (Signed)
Pt transported to CT ?
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
## 2019-06-22 NOTE — Plan of Care (Signed)

## 2019-06-22 NOTE — ED Notes (Signed)
Pt unable to provide sputum culture at this time, pt states his cough is not longer productive.

## 2019-06-22 NOTE — Progress Notes (Addendum)
Charles [name] is a 49 yr old man who abuses multiple substances. He also carries a past medical history significant for aplastic anemia with chronic thrombocytopenia, and hypertension.  He presented th [address] ED on 06/21/2019 with complaints of chest pain that is located in his central chest, shortness of breath, generlized aches, and malaise x 5 days. He also complains of a painful mass in his left submandibular space. CXR and CT chest demonstrated bilateral interstitial ground-glass airspace opacities. Troponin was elevated at 29 and then increased to 35. EKG demonstrated only NSR with repolarization abnormality and no STEMI. The patient's UDS was positive for cocaine and THC. He has tested positive for COVID 19.  The patient will be admitted to 5W with telemetry. His troponins and EKG will be followed. I have ordered inflammatory markers for COVID 19, and these will be followed. I have also ordered remdesivir and decadron. A CT of the soft tissue neck has been ordered to examine the mass in his right submandibular area. If warranted, ENT will be consulted. I have ordered IV Doxymycin for possible odontogenic infection.  The patient is requiring 2L by [place] to maintain SaO2 of 90%. Respiratory rate was elevated at 28, BP 128/101, HR 82.  He is awake, alert, and oriented x 3. No acute distress. Positive for coarse rales throughout. No wheezes or rales. Abdomen is soft, non tender, non-distended. The patient has a tender 3 cm diameter mass in his right submandibular region.  Pt will be admitted to a telemetry bed on 5W pending the outcome of work up of right submandibular mass.  ADDENDUM: I spoke with radiologist who read the patient's CT. He feels that it is sialadenitis due to a duct that is blocked by a stone. He states that he feels this is chronic. I also discussed this patient with Dr. Jearld Fenton for ENT who is on call. He stated that no acute intervention needed to take place and that the patient could be  transferred to [facility] on oral clindamycin or keflex.

## 2019-06-22 NOTE — ED Notes (Signed)
Breakfast Ordered 

## 2019-06-22 NOTE — ED Notes (Signed)
This RN called pt's niece at his request. [employer] [phone] was called, no answer. This pt's primary RN had stated that she had tried this number as well at pt's request. The number given verbally to this RN by pt matches the number of contact for his niece in the chart.

## 2019-06-22 NOTE — ED Notes (Signed)
Ordered a heart healthy breakfast--Waqas Bruhl  

## 2019-06-22 NOTE — ED Notes (Addendum)
Assumed care of pt. Pt alert, resting on cart in NAD. VSS. Equal rise and fall of chest. Call light within reach. Bed in lowest position, locked, side rails upx2. Denies any needs at this time. Will continue to monitor

## 2019-06-22 NOTE — Plan of Care (Signed)
  Problem: Clinical Measurements: Goal: Respiratory complications will improve Outcome: Progressing   

## 2019-06-22 NOTE — ED Notes (Signed)
Pt desatting on monitors into the 80s. This RN at bedside. Pt had taken oxygen off. Pt reporting "I can't breathe with it." Education provided and oxygen placed back on patient with improved sats to 93%

## 2019-06-22 NOTE — ED Notes (Signed)
Pt removed O2. O2 saturation 86% on room air. Pt placed back on nasal cannula and O2 improved to 92%.

## 2019-06-22 NOTE — ED Notes (Signed)
Patient was given a Cup of Coke. 

## 2019-06-23 LAB — D-DIMER, QUANTITATIVE: D-Dimer, Quant: 1.27 ug/mL-FEU — ABNORMAL HIGH (ref 0.00–0.50)

## 2019-06-23 LAB — COMPREHENSIVE METABOLIC PANEL
ALT: 38 U/L (ref 0–44)
AST: 49 U/L — ABNORMAL HIGH (ref 15–41)
Albumin: 2.8 g/dL — ABNORMAL LOW (ref 3.5–5.0)
Alkaline Phosphatase: 57 U/L (ref 38–126)
Anion gap: 10 (ref 5–15)
BUN: 13 mg/dL (ref 6–20)
CO2: 24 mmol/L (ref 22–32)
Calcium: 8.7 mg/dL — ABNORMAL LOW (ref 8.9–10.3)
Chloride: 102 mmol/L (ref 98–111)
Creatinine, Ser: 0.83 mg/dL (ref 0.61–1.24)
GFR calc Af Amer: >60 mL/min (ref >60)
GFR calc non Af Amer: >60 mL/min (ref >60)
Glucose, Bld: 145 mg/dL — ABNORMAL HIGH (ref 70–99)
Potassium: 4.5 mmol/L (ref 3.5–5.1)
Sodium: 136 mmol/L (ref 135–145)
Total Bilirubin: 0.5 mg/dL (ref 0.3–1.2)
Total Protein: 7.3 g/dL (ref 6.5–8.1)

## 2019-06-23 LAB — CBC WITH DIFFERENTIAL/PLATELET
Abs Immature Granulocytes: 0.05 10*3/uL (ref 0.00–0.07)
Basophils Absolute: 0 10*3/uL (ref 0.0–0.1)
Basophils Relative: 0 %
Eosinophils Absolute: 0 10*3/uL (ref 0.0–0.5)
Eosinophils Relative: 0 %
HCT: 31.6 % — ABNORMAL LOW (ref 39.0–52.0)
Hemoglobin: 10.6 g/dL — ABNORMAL LOW (ref 13.0–17.0)
Immature Granulocytes: 1 %
Lymphocytes Relative: 7 %
Lymphs Abs: 0.4 10*3/uL — ABNORMAL LOW (ref 0.7–4.0)
MCH: 34.5 pg — ABNORMAL HIGH (ref 26.0–34.0)
MCHC: 33.5 g/dL (ref 30.0–36.0)
MCV: 102.9 fL — ABNORMAL HIGH (ref 80.0–100.0)
Monocytes Absolute: 0.2 10*3/uL (ref 0.1–1.0)
Monocytes Relative: 3 %
Neutro Abs: 5.9 10*3/uL (ref 1.7–7.7)
Neutrophils Relative %: 89 %
Platelets: 85 10*3/uL — ABNORMAL LOW (ref 150–400)
RBC: 3.07 MIL/uL — ABNORMAL LOW (ref 4.22–5.81)
RDW: 15.9 % — ABNORMAL HIGH (ref 11.5–15.5)
WBC: 6.6 10*3/uL (ref 4.0–10.5)
nRBC: 0 % (ref 0.0–0.2)

## 2019-06-23 LAB — TSH: TSH: 0.21 u[IU]/mL — ABNORMAL LOW (ref 0.350–4.500)

## 2019-06-23 LAB — C-REACTIVE PROTEIN: CRP: 16.6 mg/dL — ABNORMAL HIGH (ref <1.0)

## 2019-06-23 LAB — LEGIONELLA PNEUMOPHILA SEROGP 1 UR AG: L. pneumophila Serogp 1 Ur Ag: NEGATIVE

## 2019-06-23 LAB — BRAIN NATRIURETIC PEPTIDE: B Natriuretic Peptide: 129.4 pg/mL — ABNORMAL HIGH (ref 0.0–100.0)

## 2019-06-23 LAB — FERRITIN: Ferritin: 170 ng/mL (ref 24–336)

## 2019-06-23 LAB — VITAMIN B12: Vitamin B-12: 370 pg/mL (ref 180–914)

## 2019-06-23 LAB — LACTATE DEHYDROGENASE: LDH: 337 U/L — ABNORMAL HIGH (ref 98–192)

## 2019-06-23 LAB — PROCALCITONIN: Procalcitonin: 0.13 ng/mL

## 2019-06-23 MED ORDER — CLINDAMYCIN HCL 300 MG PO CAPS: 300.0000 mg | ORAL_CAPSULE | Freq: Four times a day (QID) | ORAL | 0 refills | Status: AC

## 2019-06-23 MED ORDER — PREDNISONE 10 MG PO TABS: ORAL_TABLET | ORAL | 0 refills | Status: AC

## 2019-06-23 NOTE — Progress Notes (Signed)
SATURATION QUALIFICATIONS: (This note is used to comply with regulatory documentation for home oxygen)  Patient Saturations on Room Air at Rest = 96%  Patient Saturations on Room Air while Ambulating = 90-96%  Patient Saturations on 0 Liters of oxygen while Ambulating = pt didn't require oxygen during walk  Please briefly explain why patient needs home oxygen:

## 2019-06-23 NOTE — Discharge Instructions (Signed)
1. Follow up with PCP in 1-2 weeks °2. Please obtain BMP/CBC in one week ° ° ° °

## 2019-06-23 NOTE — Discharge Summary (Addendum)
Physician Discharge Summary  Charles [name] JJK:[id number] DOB: 1970-12-17 DOA: 06/21/2019  PCP: Patient, No Pcp Per  Admit date: 06/21/2019 Discharge date: 06/23/2019  Admitted From: Home Disposition: Home  Recommendations for Outpatient Follow-up:  1. Follow up with PCP in 1-2 weeks 2. Please obtain BMP/CBC in one week  Discharge Condition: Stable CODE STATUS: Full Diet recommendation: As tolerated  Brief/Interim Summary: Charles [name] is a 49 y.o. male with medical history significant for aplastic anemia with chronic thrombocytopenia, polysubstance abuse, and hypertension, presenting to the emergency department for evaluation of chest pain, shortness of breath, generalized aches, and malaise.  Patient reports that he developed general malaise, fatigue, and cough approximately 5 days ago.  He has also been experiencing some central chest pain but is unable to identify any alleviating or exacerbating factors for that.  He has generalized aches, involving bilateral teeth both upper and lower, as well as diffuse myalgias.  He denies any melena, hematochezia, or other bleeding.  He is unaware of any sick contacts.  Reports that he recently started a new blood pressure medication, continues to take Norvasc, and also uses a nose spray for chronic sinus congestion. Upon arrival to the ED, patient is found to be febrile to 38 C, saturating 96% on room air, tachypneic as high as 31, and with stable blood pressure.  EKG features sinus rhythm with LVH and repolarization abnormality.  Chest x-ray concerning for bilateral interstitial groundglass airspace opacities.  Chemistry panel notable for potassium of 3.3.  CBC with mild macrocytic anemia and clumped platelets.  High-sensitivity troponin is 29, then 35 several hours later.  COVID-19 antigen test is negative.  CTA chest is negative for PE or acute aortic syndrome, but notable for bilateral multifocal groundglass opacities consistent with multifocal  pneumonia.  Blood cultures were ordered, COVID-19 PCR is pending, and the patient was treated with Rocephin and azithromycin, and started on supplemental oxygen.  Patient admitted as above with complaints of chest pain, patient remains without hypoxia although imaging was remarkable for bilateral groundglass opacifications concerning for pneumonia and given patient's COVID-19 positive status he was transitioned to [facility] for further evaluation and treatment.  At our facility patient was able to be weaned off oxygen aggressively, ambulating without hypoxia or further dyspnea or chest pain.  Patient also had notable likely ductal obstruction noted on CT and was placed on clindamycin to rule out possible oral infection.  Otherwise majority of patient's labs and imaging were unremarkable other than infiltrate on CT chest and questionable salivary duct blockage.  Patient's minimally elevated troponin likely in the setting of recent cocaine use given UDS positive for cocaine and THC.  Lengthy discussion at bedside about need for medication compliance and to avoid substances.  Given patient's remarkable improvement -much quicker than anticipated and resolution of chest pain, without having hypoxia or overt symptoms with ambulation - patient was deemed appropriate for discharge.  Discussed that patient does not need Remdesivir given he does not meet criteria as he was never truly hypoxic, as such patient is candidate to follow-up with the outpatient infusion center for monoclonal antibody therapy.  We did continue patient's steroids at discharge for symptom management should he have worsening respiratory status although given his age and comorbidities it is very likely he will do quite well with just supportive care at discharge.  We discussed patient will need close follow-up with PCP in the next 1 to 2 weeks to further evaluate and ensure resolution of pneumonia and  ENT as indicated to further evaluate ductal  blockage/stone.  Discharge Diagnoses:  Principal Problem:   Multifocal pneumonia Active Problems:   Thrombocytopenia (HCC)   Acute respiratory failure with hypoxia (HCC)   Hypokalemia   Pneumonia due to COVID-19 virus    Discharge Instructions  Discharge Instructions    Call MD for:  difficulty breathing, headache or visual disturbances   Complete by: As directed    Call MD for:  extreme fatigue   Complete by: As directed    Call MD for:  hives   Complete by: As directed    Call MD for:  persistant dizziness or light-headedness   Complete by: As directed    Call MD for:  persistant nausea and vomiting   Complete by: As directed    Call MD for:  severe uncontrolled pain   Complete by: As directed    Call MD for:  temperature >100.4   Complete by: As directed    Diet - low sodium heart healthy   Complete by: As directed    Increase activity slowly   Complete by: As directed      Allergies as of 06/23/2019      Reactions   Banana Nausea And Vomiting   Eggs Or Egg-derived Products Nausea And Vomiting   Pork-derived Products Nausea And Vomiting      Medication List    STOP taking these medications   ibuprofen 200 MG tablet Commonly known as: ADVIL   ibuprofen 600 MG tablet Commonly known as: ADVIL     TAKE these medications   acetaminophen 500 MG tablet Commonly known as: TYLENOL Take 1,000 mg by mouth every 6 (six) hours as needed for mild pain.   amLODipine 2.5 MG tablet Commonly known as: NORVASC Take 1 tablet (2.5 mg total) by mouth daily.   amLODipine 10 MG tablet Commonly known as: NORVASC Take 10 mg by mouth daily.   clindamycin 300 MG capsule Commonly known as: CLEOCIN Take 1 capsule (300 mg total) by mouth every 6 (six) hours for 9 days.   diphenhydrAMINE 25 MG tablet Commonly known as: BENADRYL Take 25 mg by mouth every 6 (six) hours as needed for allergies.   fluticasone 50 MCG/ACT nasal spray Commonly known as: FLONASE Place 2 sprays  into both nostrils 2 (two) times daily.   predniSONE 10 MG tablet Commonly known as: DELTASONE Take 4 tablets (40 mg total) by mouth daily for 3 days, THEN 3 tablets (30 mg total) daily for 3 days, THEN 2 tablets (20 mg total) daily for 3 days, THEN 1 tablet (10 mg total) daily for 3 days. Start taking on: June 23, 2019 What changed: See the new instructions.   valsartan-hydrochlorothiazide 80-12.5 MG tablet Commonly known as: DIOVAN-HCT Take 1 tablet by mouth daily.       Allergies  Allergen Reactions  . Banana Nausea And Vomiting  . Eggs Or Egg-Derived Products Nausea And Vomiting  . Pork-Derived Products Nausea And Vomiting    Procedures/Studies: DG Chest 2 View  Result Date: 06/21/2019 CLINICAL DATA:  Chest pain and shortness of breath, body aches for several days, fever EXAM: CHEST - 2 VIEW COMPARISON:  08/02/2016 FINDINGS: Frontal and lateral views of the chest demonstrate diffuse interstitial prominence with bilateral perihilar ground-glass airspace disease. No effusion or pneumothorax. Cardiac silhouette is unremarkable. No acute bony abnormalities. Embolic coils left upper quadrant. IMPRESSION: 1. Bilateral interstitial and perihilar ground-glass airspace opacities, most consistent with atypical viral pneumonia such as COVID 19. Electronically  Signed   By: Randa Ngo M.D.   On: 06/21/2019 18:16   CT SOFT TISSUE NECK W CONTRAST  Addendum Date: 06/22/2019   ADDENDUM REPORT: 06/22/2019 15:00 ADDENDUM: Addendum discussed with Dr. Benny Lennert by telephone at 2:45 p.m. on 06/22/2019 Electronically Signed   By: Kellie Simmering DO   On: 06/22/2019 15:00   Addendum Date: 06/22/2019   ADDENDUM REPORT: 06/22/2019 14:37 ADDENDUM: Please note there is an error within the original impression. The described right submandibular gland sialoliths are present within Wharton's duct. Electronically Signed   By: Kellie Simmering DO   On: 06/22/2019 14:37   Result Date: 06/22/2019 CLINICAL DATA:  Parotid  region mass. Additional history provided: Chest pain, shortness of breath, generalized aches, malaise for 5 days, painful submandibular space mass. EXAM: CT NECK WITH CONTRAST TECHNIQUE: Multidetector CT imaging of the neck was performed using the standard protocol following the bolus administration of intravenous contrast. CONTRAST:  81mL OMNIPAQUE IOHEXOL 300 MG/ML  SOLN COMPARISON:  CT angiogram chest performed earlier the same day 06/22/2019, CT head/maxillofacial 04/18/2009 FINDINGS: Pharynx and larynx: There is periapical lucency surrounding a carious posterior left mandibular molar (series 7, image 65). No adjacent soft tissue abscess is identified. No appreciable swelling or discrete mass within the pharynx or larynx. Salivary glands: There is a 1.8 x 0.8 cm right submandibular gland calculus within Stensen's duct (series 3, image 32). This has slightly increased in size since prior examination 04/18/2009 (previously 1.4 x 0.6 cm). There is an additional right submandibular gland sialolith within Stensen's duct more anteriorly measuring 0.4 cm (series 3, image 29). No definite associated inflammatory changes, although evaluation is somewhat limited by diffuse edema throughout the neck. The bilateral parotid and submandibular glands are otherwise unremarkable. Thyroid: No abnormality. Lymph nodes: Cervical chain lymph nodes are diffusely prominent in number, nonspecific. No pathologically enlarged cervical chain lymph nodes are identified. Vascular: The major vascular structures of the neck appear patent. Calcified plaque within the carotid arteries bilaterally. Limited intracranial: No abnormality identified. Visualized orbits: Not included in the field of view. Mastoids and visualized paranasal sinuses: No significant paranasal sinus disease or mastoid effusion at the imaged levels. Skeleton: No acute bony abnormality or aggressive osseous lesion. Upper chest: Extensive airspace opacity within the imaged  lungs consistent with known pneumonia. Other: There is diffuse edema throughout the neck soft tissues. IMPRESSION: Right submandibular gland sialoliths within Stensen's duct measuring up to 1.8 x 0.8 cm. No definite associated inflammatory changes, although evaluation is somewhat limited by mild diffuse edema throughout the neck. Periapical lucency surrounding a carious posterior left mandibular molar. No adjacent soft tissue abscess is identified. Cervical chain lymph nodes are diffusely prominent in number throughout the neck, nonspecific. No pathologically enlarged cervical chain lymph nodes identified. Extensive airspace disease within the partially imaged lungs consistent with known pneumonia. Electronically Signed: By: Kellie Simmering DO On: 06/22/2019 14:08   CT Angio Chest PE W and/or Wo Contrast  Result Date: 06/22/2019 CLINICAL DATA:  Hypoxemia and chest pain EXAM: CT ANGIOGRAPHY CHEST WITH CONTRAST TECHNIQUE: Multidetector CT imaging of the chest was performed using the standard protocol during bolus administration of intravenous contrast. Multiplanar CT image reconstructions and MIPs were obtained to evaluate the vascular anatomy. CONTRAST:  157mL OMNIPAQUE IOHEXOL 350 MG/ML SOLN COMPARISON:  None. FINDINGS: Cardiovascular: Contrast injection is sufficient to demonstrate satisfactory opacification of the pulmonary arteries to the segmental level. There is no pulmonary embolus or evidence of right heart strain. The size of the main  pulmonary artery is normal. Heart size is normal, with no pericardial effusion. The course and caliber of the aorta are normal. There is no atherosclerotic calcification. Opacification decreased due to pulmonary arterial phase contrast bolus timing. Mediastinum/Nodes: No mediastinal, hilar or axillary lymphadenopathy. The visualized thyroid and thoracic esophageal course are unremarkable. Lungs/Pleura: Bilateral multifocal ground-glass opacities with relative sparing of the  lingula and right middle lobe. No pleural effusion. Airways are patent. Upper Abdomen: Contrast bolus timing is not optimized for evaluation of the abdominal organs. The visualized portions of the organs of the upper abdomen are normal. Musculoskeletal: No chest wall abnormality. No bony spinal canal stenosis. Review of the MIP images confirms the above findings. IMPRESSION: 1. No pulmonary embolus or acute aortic syndrome. 2. Bilateral multifocal ground-glass opacities with relative sparing of the lingula and right middle lobe. This may indicate multifocal pneumonia. Electronically Signed   By: Deatra Robinson M.D.   On: 06/22/2019 03:37     Subjective: No acute issues or events overnight, patient indicates his only complaint this morning is that he would like more food.  Declines chest pain, shortness of breath, nausea, vomiting, diarrhea, constipation, headache, fevers, chills.   Discharge Exam: Vitals:   06/23/19 0745 06/23/19 1610  BP: 135/84 (!) 173/91  Pulse: 82 85  Resp: 16 18  Temp: 98.3 F (36.8 C) 97.9 F (36.6 C)  SpO2: 98% 94%   Vitals:   06/23/19 0127 06/23/19 0400 06/23/19 0745 06/23/19 1610  BP: (!) 151/64 (!) 149/85 135/84 (!) 173/91  Pulse:   82 85  Resp:   16 18  Temp: 97.8 F (36.6 C) 98 F (36.7 C) 98.3 F (36.8 C) 97.9 F (36.6 C)  TempSrc: Oral Oral Oral Oral  SpO2: 97%  98% 94%  Weight:      Height:        General:  Pleasantly resting in bed, No acute distress. HEENT:  Normocephalic atraumatic.  Sclerae nonicteric, noninjected.  Extraocular movements intact bilaterally. Neck:  Without mass or deformity.  Trachea is midline. Lungs:  Clear to auscultate bilaterally without rhonchi, wheeze, or rales. Heart:  Regular rate and rhythm.  Without murmurs, rubs, or gallops. Abdomen:  Soft, nontender, nondistended.  Without guarding or rebound. Extremities: Without cyanosis, clubbing, edema, or obvious deformity. Vascular:  Dorsalis pedis and posterior tibial  pulses palpable bilaterally. Skin:  Warm and dry, no erythema, no ulcerations.   The results of significant diagnostics from this hospitalization (including imaging, microbiology, ancillary and laboratory) are listed below for reference.     Microbiology: Recent Results (from the past 240 hour(s))  SARS CORONAVIRUS 2 (TAT 6-24 HRS) Nasopharyngeal Nasopharyngeal Swab     Status: Abnormal   Collection Time: 06/22/19  3:10 AM   Specimen: Nasopharyngeal Swab  Result Value Ref Range Status   SARS Coronavirus 2 POSITIVE (A) NEGATIVE Final    Comment: RESULT CALLED TO, READ BACK BY AND VERIFIED WITH: Candy Sledge RN 9:40 06/22/19 ([email or web address]) (NOTE) SARS-CoV-2 target nucleic acids are DETECTED. The SARS-CoV-2 RNA is generally detectable in upper and lower respiratory specimens during the acute phase of infection. Positive results are indicative of the presence of SARS-CoV-2 RNA. Clinical correlation with patient history and other diagnostic information is  necessary to determine patient infection status. Positive results do not rule out bacterial infection or co-infection with other viruses.  The expected result is Negative. Fact Sheet for Patients: HairSlick.no Fact Sheet for Healthcare Providers: quierodirigir.com This test is not yet approved or cleared by the [place] FDA and  has been authorized for detection and/or diagnosis of SARS-CoV-2 by FDA under an Emergency Use Authorization (EUA). This EUA will remain  in effect (meaning this test can be used) for the d uration of the COVID-19 declaration under Section 564(b)(1) of the Act, 21 U.S.C. section 360bbb-3(b)(1), unless the authorization is terminated or revoked sooner. Performed at [facility], [address] [place], [place] [address]   Blood culture (routine x 2)     Status: None (Preliminary result)   Collection Time: 06/22/19  3:50 AM   Specimen: BLOOD RIGHT HAND   Result Value Ref Range Status   Specimen Description BLOOD RIGHT HAND  Final   Special Requests   Final    BOTTLES DRAWN AEROBIC AND ANAEROBIC Blood Culture adequate volume   Culture   Final    NO GROWTH 1 DAY Performed at [facility], [address], [place], [place] [address]    Report Status PENDING  Incomplete  Blood culture (routine x 2)     Status: None (Preliminary result)   Collection Time: 06/22/19  3:55 AM   Specimen: BLOOD LEFT HAND  Result Value Ref Range Status   Specimen Description BLOOD LEFT HAND  Final   Special Requests   Final    BOTTLES DRAWN AEROBIC AND ANAEROBIC Blood Culture results may not be optimal due to an inadequate volume of blood received in culture bottles   Culture   Final    NO GROWTH 1 DAY Performed at [facility], [address], [place], [place] [address]    Report Status PENDING  Incomplete     Labs: BNP (last 3 results) Recent Labs    06/23/19 0325  BNP 129.4*   Basic Metabolic Panel: Recent Labs  Lab 06/21/19 1735 06/22/19 0537 06/23/19 0325  NA 137 136 136  K 3.3* 3.0* 4.5  CL 100 99 102  CO2 24 27 24   GLUCOSE 88 145* 145*  BUN 9 12 13   CREATININE 1.12 1.24 0.83  CALCIUM 8.5* 7.9* 8.7*   Liver Function Tests: Recent Labs  Lab 06/23/19 0325  AST 49*  ALT 38  ALKPHOS 57  BILITOT 0.5  PROT 7.3  ALBUMIN 2.8*   No results for input(s): LIPASE, AMYLASE in the last 168 hours. No results for input(s): AMMONIA in the last 168 hours. CBC: Recent Labs  Lab 06/21/19 1735 06/22/19 0537 06/23/19 0325  WBC 5.4 4.6 6.6  NEUTROABS  --  3.1 5.9  HGB 11.7* 10.7* 10.6*  HCT 34.8* 32.2* 31.6*  MCV 104.2* 104.2* 102.9*  PLT PLATELET CLUMPS NOTED ON SMEAR, COUNT APPEARS DECREASED 79* 85*   Cardiac Enzymes: No results for input(s): CKTOTAL, CKMB, CKMBINDEX, TROPONINI in the last 168 hours. BNP: Invalid input(s): POCBNP CBG: No results for input(s): GLUCAP in the last 168 hours. D-Dimer Recent Labs     06/23/19 0325  DDIMER 1.27*   Hgb A1c No results for input(s): HGBA1C in the last 72 hours. Lipid Profile No results for input(s): CHOL, HDL, LDLCALC, TRIG, CHOLHDL, LDLDIRECT in the last 72 hours. Thyroid function studies Recent Labs    06/23/19 0325  TSH 0.210*   Anemia work up Recent Labs    06/23/19 0325  VITAMINB12 370  FERRITIN 170   Urinalysis    Component Value Date/Time   COLORURINE YELLOW 05/12/2019 1459   APPEARANCEUR CLEAR 05/12/2019 1459   LABSPEC 1.021 05/12/2019 1459   PHURINE 6.0 05/12/2019 1459   GLUCOSEU NEGATIVE  05/12/2019 1459   HGBUR NEGATIVE 05/12/2019 1459   BILIRUBINUR NEGATIVE 05/12/2019 1459   KETONESUR NEGATIVE 05/12/2019 1459   PROTEINUR NEGATIVE 05/12/2019 1459   UROBILINOGEN 1.0 03/10/2015 1203   NITRITE NEGATIVE 05/12/2019 1459   LEUKOCYTESUR NEGATIVE 05/12/2019 1459   Sepsis Labs Invalid input(s): PROCALCITONIN,  WBC,  LACTICIDVEN Microbiology Recent Results (from the past 240 hour(s))  SARS CORONAVIRUS 2 (TAT 6-24 HRS) Nasopharyngeal Nasopharyngeal Swab     Status: Abnormal   Collection Time: 06/22/19  3:10 AM   Specimen: Nasopharyngeal Swab  Result Value Ref Range Status   SARS Coronavirus 2 POSITIVE (A) NEGATIVE Final    Comment: RESULT CALLED TO, READ BACK BY AND VERIFIED WITH: Candy Sledge RN 9:40 06/22/19 ([email or web address]) (NOTE) SARS-CoV-2 target nucleic acids are DETECTED. The SARS-CoV-2 RNA is generally detectable in upper and lower respiratory specimens during the acute phase of infection. Positive results are indicative of the presence of SARS-CoV-2 RNA. Clinical correlation with patient history and other diagnostic information is  necessary to determine patient infection status. Positive results do not rule out bacterial infection or co-infection with other viruses.  The expected result is Negative. Fact Sheet for Patients: HairSlick.no Fact Sheet for Healthcare  Providers: quierodirigir.com This test is not yet approved or cleared by the [place] FDA and  has been authorized for detection and/or diagnosis of SARS-CoV-2 by FDA under an Emergency Use Authorization (EUA). This EUA will remain  in effect (meaning this test can be used) for the d uration of the COVID-19 declaration under Section 564(b)(1) of the Act, 21 U.S.C. section 360bbb-3(b)(1), unless the authorization is terminated or revoked sooner. Performed at [facility], [address], [place], [place] [address]   Blood culture (routine x 2)     Status: None (Preliminary result)   Collection Time: 06/22/19  3:50 AM   Specimen: BLOOD RIGHT HAND  Result Value Ref Range Status   Specimen Description BLOOD RIGHT HAND  Final   Special Requests   Final    BOTTLES DRAWN AEROBIC AND ANAEROBIC Blood Culture adequate volume   Culture   Final    NO GROWTH 1 DAY Performed at [facility], [address], [place], [place] [address]    Report Status PENDING  Incomplete  Blood culture (routine x 2)     Status: None (Preliminary result)   Collection Time: 06/22/19  3:55 AM   Specimen: BLOOD LEFT HAND  Result Value Ref Range Status   Specimen Description BLOOD LEFT HAND  Final   Special Requests   Final    BOTTLES DRAWN AEROBIC AND ANAEROBIC Blood Culture results may not be optimal due to an inadequate volume of blood received in culture bottles   Culture   Final    NO GROWTH 1 DAY Performed at [facility], [address], [place], [place] [address]    Report Status PENDING  Incomplete     Time coordinating discharge: Over 30 minutes  SIGNED:   Azucena Fallen, DO [facility] 06/23/2019, 4:45 PM

## 2019-06-23 NOTE — TOC Initial Note (Signed)
Transition of Care [facility]) - Initial/Assessment Note    Patient Details  Name: Charles [name] MRN: [id number] Date of Birth: [birth year]  Transition of Care [facility]) CM/SW Contact:    Ross Ludwig, LCSW Phone Number: 06/23/2019, 3:08 PM  Clinical Narrative:                  CSW received consult that patient is currently homeless.  Patient has a sister and a niece who said that patient can not stay with them while he is Covid+.  CSW spoke to patient to discuss partnership with [facility] in which a patient can go to a hotel if he qualifies to stay for 14 days during quarantine from Covid 19.  Patient is agreeable to see if he qualifies.  CSW contacted [facility], and spoke to Epimenio Sarin [phone] she emailed required paperwork that needs to be completed by patient and bedside nurse then faxed back to her.    CSW contacted secretary Jeannine Boga on the floor and she was going to print the application for patient and bedside nurse to complete.  CSW asked her if she could fax the application along with a copy of the Covid test to [employer] at [phone].  Application was faxed to [facility], CSW Awaiting for call back about if patient qualifies.  CSW updated bedside nurse.   Expected Discharge Plan: [facility] for [employer]) Barriers to Discharge: Continued Medical Work up   Patient Goals and CMS Choice Patient states their goals for this hospitalization and ongoing recovery are:: To go to hotel, for 14 days for [employer].gov Compare Post Acute Care list provided to:: Patient Choice offered to / list presented to : Patient  Expected Discharge Plan and Services Expected Discharge Plan: [facility] for [employer]) In-house Referral: Clinical Social Work Discharge Planning Services: Other - See comment(Covid 19 quarantine at hotel.) Post Acute Care Choice: NA Living arrangements for the past 2 months: Single  Family Home                 DME Arranged: N/A DME Agency: NA                  Prior Living Arrangements/Services Living arrangements for the past 2 months: Single Family Home Lives with:: Relatives Patient language and need for interpreter reviewed:: Yes Do you feel safe going back to the place where you live?: No   Patient does not have anywhere to go he is homeless.  Need for Family Participation in Patient Care: No (Comment) Care giver support system in place?: No (comment) Current home services: (na) Criminal Activity/Legal Involvement Pertinent to Current Situation/Hospitalization: No - Comment as needed  Activities of Daily Living Home Assistive Devices/Equipment: None ADL Screening (condition at time of admission) Patient's cognitive ability adequate to safely complete daily activities?: Yes Is the patient deaf or have difficulty hearing?: No Does the patient have difficulty seeing, even when wearing glasses/contacts?: No Does the patient have difficulty concentrating, remembering, or making decisions?: No Patient able to express need for assistance with ADLs?: Yes Does the patient have difficulty dressing or bathing?: No Independently performs ADLs?: Yes (appropriate for developmental age) Does the patient have difficulty walking or climbing stairs?: No Weakness of Legs: Both Weakness of Arms/Hands: Both  Permission Sought/Granted Permission sought to share information with : Family Supports Permission granted to share information with : Yes, Release of Information Signed, Yes, Verbal Permission Granted  Share Information  with NAME: Romm,Dionne Sister [phone] or Faiella,Sunieke Niece [phone]  [phone]  Permission granted to share info w AGENCY: [facility]        Emotional Assessment Appearance:: Appears stated age Attitude/Demeanor/Rapport: Engaged Affect (typically observed): Accepting, Stable Orientation: : Oriented to Self,  Oriented to Place, Oriented to  Time, Oriented to Situation Alcohol / Substance Use: Illicit Drugs Psych Involvement: No (comment)  Admission diagnosis:  Multifocal pneumonia [J18.9] Community acquired pneumonia, unspecified laterality [J18.9] Pneumonia due to COVID-19 virus [U07.1, J12.82] Patient Active Problem List   Diagnosis Date Noted  . Multifocal pneumonia 06/22/2019  . Acute respiratory failure with hypoxia (HCC) 06/22/2019  . Hypokalemia 06/22/2019  . Pneumonia due to COVID-19 virus 06/22/2019  . Splenic laceration, initial encounter 08/02/2016  . Thrombocytopenia (HCC) 10/19/2014  . Lobar pneumonia due to unspecified organism 10/19/2014  . Tobacco use disorder 10/19/2014  . AKI (acute kidney injury) (HCC) 10/19/2014  . Bilateral pneumonia 10/17/2014  . PNA (pneumonia) 10/17/2014   PCP:  Patient, No Pcp Per Pharmacy:   [id number] - [place], [place] - [address] AT [facility] [address] [place] [address] Phone: [phone] Fax: [phone]     Social Determinants of Health (SDOH) Interventions    Readmission Risk Interventions No flowsheet data found.

## 2019-06-23 NOTE — Plan of Care (Signed)
  Problem: Education: Goal: Knowledge of General Education information will improve Description: Including pain rating scale, medication(s)/side effects and non-pharmacologic comfort measures Outcome: Progressing   Problem: Health Behavior/Discharge Planning: Goal: Ability to manage health-related needs will improve Outcome: Progressing   Problem: Clinical Measurements: Goal: Ability to maintain clinical measurements within normal limits will improve Outcome: Progressing Goal: Will remain free from infection Outcome: Progressing Goal: Diagnostic test results will improve Outcome: Progressing Goal: Respiratory complications will improve Outcome: Progressing Goal: Cardiovascular complication will be avoided Outcome: Progressing   Problem: Clinical Measurements: Goal: Will remain free from infection Outcome: Progressing   Problem: Clinical Measurements: Goal: Diagnostic test results will improve Outcome: Progressing   Problem: Clinical Measurements: Goal: Respiratory complications will improve Outcome: Progressing   Problem: Coping: Goal: Level of anxiety will decrease Outcome: Progressing   Problem: Elimination: Goal: Will not experience complications related to urinary retention Outcome: Progressing

## 2019-06-23 NOTE — TOC Progression Note (Addendum)
Transition of Care [facility]) - Progression Note    Patient Details  Name: Charles [name] MRN: [id number] Date of Birth: 1971-01-26  Transition of Care [facility]) CM/SW Contact  Darleene Cleaver, [place] Phone Number: 06/23/2019, 5:56 PM  Clinical Narrative:     CSW received a phone call back from Gavin Pound at [employer] [facility], patient was approved for [place]'s COVID-19 Continuum of Care Plan, and patient agreed to follow the rules and will stay at a hotel for the period he needs to quarentine.  CSW explained to patient that he will have to follow the rules that the department of public health or else he will not be allowed to stay any longer.  CSW explained to patient, and he is aware of the expectations.   Expected Discharge Plan: [facility] for [employer]) Barriers to Discharge: Continued Medical Work up  [facility] Expected Discharge Plan: [employer] for [employer]) In-house Referral: Clinical Social Work Discharge Planning Services: Other - See comment(Covid 19 quarantine at hotel.) Post Acute Care Choice: NA Living arrangements for the past 2 months: Single Family Home Expected Discharge Date: 06/23/19               DME Arranged: N/A DME Agency: NA                   Social Determinants of Health (SDOH) Interventions    Readmission Risk Interventions No flowsheet data found.

## 2019-06-24 LAB — COMPREHENSIVE METABOLIC PANEL
ALT: 115 U/L — ABNORMAL HIGH (ref 0–44)
AST: 139 U/L — ABNORMAL HIGH (ref 15–41)
Albumin: 2.8 g/dL — ABNORMAL LOW (ref 3.5–5.0)
Alkaline Phosphatase: 63 U/L (ref 38–126)
Anion gap: 9 (ref 5–15)
BUN: 15 mg/dL (ref 6–20)
CO2: 28 mmol/L (ref 22–32)
Calcium: 8.8 mg/dL — ABNORMAL LOW (ref 8.9–10.3)
Chloride: 102 mmol/L (ref 98–111)
Creatinine, Ser: 0.86 mg/dL (ref 0.61–1.24)
GFR calc Af Amer: >60 mL/min (ref >60)
GFR calc non Af Amer: >60 mL/min (ref >60)
Glucose, Bld: 128 mg/dL — ABNORMAL HIGH (ref 70–99)
Potassium: 4.4 mmol/L (ref 3.5–5.1)
Sodium: 139 mmol/L (ref 135–145)
Total Bilirubin: 0.3 mg/dL (ref 0.3–1.2)
Total Protein: 7.2 g/dL (ref 6.5–8.1)

## 2019-06-24 LAB — CBC WITH DIFFERENTIAL/PLATELET
Abs Immature Granulocytes: 0.04 10*3/uL (ref 0.00–0.07)
Basophils Absolute: 0 10*3/uL (ref 0.0–0.1)
Basophils Relative: 0 %
Eosinophils Absolute: 0 10*3/uL (ref 0.0–0.5)
Eosinophils Relative: 0 %
HCT: 32.6 % — ABNORMAL LOW (ref 39.0–52.0)
Hemoglobin: 10.8 g/dL — ABNORMAL LOW (ref 13.0–17.0)
Immature Granulocytes: 0 %
Lymphocytes Relative: 9 %
Lymphs Abs: 0.8 10*3/uL (ref 0.7–4.0)
MCH: 34.7 pg — ABNORMAL HIGH (ref 26.0–34.0)
MCHC: 33.1 g/dL (ref 30.0–36.0)
MCV: 104.8 fL — ABNORMAL HIGH (ref 80.0–100.0)
Monocytes Absolute: 0.5 10*3/uL (ref 0.1–1.0)
Monocytes Relative: 6 %
Neutro Abs: 8.1 10*3/uL — ABNORMAL HIGH (ref 1.7–7.7)
Neutrophils Relative %: 85 %
Platelets: 99 10*3/uL — ABNORMAL LOW (ref 150–400)
RBC: 3.11 MIL/uL — ABNORMAL LOW (ref 4.22–5.81)
RDW: 15.9 % — ABNORMAL HIGH (ref 11.5–15.5)
WBC: 9.5 10*3/uL (ref 4.0–10.5)
nRBC: 0 % (ref 0.0–0.2)

## 2019-06-24 LAB — FOLATE RBC
Folate, Hemolysate: 372 ng/mL
Folate, RBC: 1212 ng/mL (ref >498)
Hematocrit: 30.7 % — ABNORMAL LOW (ref 37.5–51.0)

## 2019-06-24 LAB — LACTATE DEHYDROGENASE: LDH: 402 U/L — ABNORMAL HIGH (ref 98–192)

## 2019-06-24 LAB — C-REACTIVE PROTEIN: CRP: 8.5 mg/dL — ABNORMAL HIGH (ref <1.0)

## 2019-06-24 LAB — PROCALCITONIN: Procalcitonin: 0.11 ng/mL

## 2019-06-24 LAB — FERRITIN: Ferritin: 269 ng/mL (ref 24–336)

## 2019-06-24 LAB — D-DIMER, QUANTITATIVE: D-Dimer, Quant: 0.94 ug/mL-FEU — ABNORMAL HIGH (ref 0.00–0.50)

## 2019-06-24 NOTE — Progress Notes (Signed)
Physician Discharge Summary  Charles [name] HKV:[id number] DOB: [birth year] DOA: 06/21/2019  PCP: Patient, No Pcp Per  Admit date: 06/21/2019 Discharge date: 06/24/2019  Admitted From: Home Disposition: Home  Recommendations for Outpatient Follow-up:  1. Follow up with PCP in 1-2 weeks 2. Please obtain BMP/CBC in one week  Discharge Condition: Stable CODE STATUS: Full Diet recommendation: As tolerated  Brief/Interim Summary: Charles [name] is a 49 y.o. male with medical history significant for aplastic anemia with chronic thrombocytopenia, polysubstance abuse, and hypertension, presenting to the emergency department for evaluation of chest pain, shortness of breath, generalized aches, and malaise.  Patient reports that he developed general malaise, fatigue, and cough approximately 5 days ago.  He has also been experiencing some central chest pain but is unable to identify any alleviating or exacerbating factors for that.  He has generalized aches, involving bilateral teeth both upper and lower, as well as diffuse myalgias.  He denies any melena, hematochezia, or other bleeding.  He is unaware of any sick contacts.  Reports that he recently started a new blood pressure medication, continues to take Norvasc, and also uses a nose spray for chronic sinus congestion. Upon arrival to the ED, patient is found to be febrile to 38 C, saturating 96% on room air, tachypneic as high as 31, and with stable blood pressure.  EKG features sinus rhythm with LVH and repolarization abnormality.  Chest x-ray concerning for bilateral interstitial groundglass airspace opacities.  Chemistry panel notable for potassium of 3.3.  CBC with mild macrocytic anemia and clumped platelets.  High-sensitivity troponin is 29, then 35 several hours later.  COVID-19 antigen test is negative.  CTA chest is negative for PE or acute aortic syndrome, but notable for bilateral multifocal groundglass opacities consistent with multifocal  pneumonia.  Blood cultures were ordered, COVID-19 PCR is pending, and the patient was treated with Rocephin and azithromycin, and started on supplemental oxygen.  Patient admitted as above with complaints of chest pain, patient remains without hypoxia although imaging was remarkable for bilateral groundglass opacifications concerning for pneumonia and given patient's COVID-19 positive status he was transitioned to [facility] for further evaluation and treatment.  At our facility patient was able to be weaned off oxygen aggressively, ambulating without hypoxia or further dyspnea or chest pain.  Patient also had notable likely ductal obstruction noted on CT and was placed on clindamycin to rule out possible oral infection.  Otherwise majority of patient's labs and imaging were unremarkable other than infiltrate on CT chest and questionable salivary duct blockage.  Patient's minimally elevated troponin likely in the setting of recent cocaine use given UDS positive for cocaine and THC.  Lengthy discussion at bedside about need for medication compliance and to avoid substances.  Given patient's remarkable improvement -much quicker than anticipated and resolution of chest pain, without having hypoxia or overt symptoms with ambulation - patient was deemed appropriate for discharge.  Discussed that patient does not need Remdesivir given he does not meet criteria as he was never truly hypoxic, as such patient is candidate to follow-up with the outpatient infusion center for monoclonal antibody therapy.  We did continue patient's steroids at discharge for symptom management should he have worsening respiratory status although given his age and comorbidities it is very likely he will do quite well with just supportive care at discharge.  We discussed patient will need close follow-up with PCP in the next 1 to 2 weeks to further evaluate and ensure resolution of pneumonia and  ENT as indicated to further evaluate ductal  blockage/stone.  Patient able to discharge on 06/23/2019 as expected due to placement issues, patient is homeless, unable to secure safe disposition on his own, family unwilling to accept the patient as he is Covid positive.  Case management working diligently to place patient in hotel, she remains medically stable for discharge once safe disposition to hotel can be arranged later today on 06/24/19.  Discharge Diagnoses:  Principal Problem:   Multifocal pneumonia Active Problems:   Thrombocytopenia (HCC)   Acute respiratory failure with hypoxia (HCC)   Hypokalemia   Pneumonia due to COVID-19 virus    Discharge Instructions  Discharge Instructions    Call MD for:  difficulty breathing, headache or visual disturbances   Complete by: As directed    Call MD for:  extreme fatigue   Complete by: As directed    Call MD for:  hives   Complete by: As directed    Call MD for:  persistant dizziness or light-headedness   Complete by: As directed    Call MD for:  persistant nausea and vomiting   Complete by: As directed    Call MD for:  severe uncontrolled pain   Complete by: As directed    Call MD for:  temperature >100.4   Complete by: As directed    Diet - low sodium heart healthy   Complete by: As directed    Increase activity slowly   Complete by: As directed      Allergies as of 06/24/2019      Reactions   Banana Nausea And Vomiting   Eggs Or Egg-derived Products Nausea And Vomiting   Pork-derived Products Nausea And Vomiting      Medication List    STOP taking these medications   ibuprofen 200 MG tablet Commonly known as: ADVIL   ibuprofen 600 MG tablet Commonly known as: ADVIL     TAKE these medications   acetaminophen 500 MG tablet Commonly known as: TYLENOL Take 1,000 mg by mouth every 6 (six) hours as needed for mild pain.   amLODipine 10 MG tablet Commonly known as: NORVASC Take 10 mg by mouth daily. What changed: Another medication with the same name was  removed. Continue taking this medication, and follow the directions you see here.   clindamycin 300 MG capsule Commonly known as: CLEOCIN Take 1 capsule (300 mg total) by mouth every 6 (six) hours for 9 days.   diphenhydrAMINE 25 MG tablet Commonly known as: BENADRYL Take 25 mg by mouth every 6 (six) hours as needed for allergies.   fluticasone 50 MCG/ACT nasal spray Commonly known as: FLONASE Place 2 sprays into both nostrils 2 (two) times daily.   predniSONE 10 MG tablet Commonly known as: DELTASONE Take 4 tablets (40 mg total) by mouth daily for 3 days, THEN 3 tablets (30 mg total) daily for 3 days, THEN 2 tablets (20 mg total) daily for 3 days, THEN 1 tablet (10 mg total) daily for 3 days. Start taking on: June 23, 2019 What changed: See the new instructions.   valsartan-hydrochlorothiazide 80-12.5 MG tablet Commonly known as: DIOVAN-HCT Take 1 tablet by mouth daily.       Allergies  Allergen Reactions  . Banana Nausea And Vomiting  . Eggs Or Egg-Derived Products Nausea And Vomiting  . Pork-Derived Products Nausea And Vomiting    Procedures/Studies: DG Chest 2 View  Result Date: 06/21/2019 CLINICAL DATA:  Chest pain and shortness of breath, body aches for several  days, fever EXAM: CHEST - 2 VIEW COMPARISON:  08/02/2016 FINDINGS: Frontal and lateral views of the chest demonstrate diffuse interstitial prominence with bilateral perihilar ground-glass airspace disease. No effusion or pneumothorax. Cardiac silhouette is unremarkable. No acute bony abnormalities. Embolic coils left upper quadrant. IMPRESSION: 1. Bilateral interstitial and perihilar ground-glass airspace opacities, most consistent with atypical viral pneumonia such as COVID 19. Electronically Signed   By: Randa Ngo M.D.   On: 06/21/2019 18:16   CT SOFT TISSUE NECK W CONTRAST  Addendum Date: 06/22/2019   ADDENDUM REPORT: 06/22/2019 15:00 ADDENDUM: Addendum discussed with Dr. Benny Lennert by telephone at 2:45  p.m. on 06/22/2019 Electronically Signed   By: Kellie Simmering DO   On: 06/22/2019 15:00   Addendum Date: 06/22/2019   ADDENDUM REPORT: 06/22/2019 14:37 ADDENDUM: Please note there is an error within the original impression. The described right submandibular gland sialoliths are present within Wharton's duct. Electronically Signed   By: Kellie Simmering DO   On: 06/22/2019 14:37   Result Date: 06/22/2019 CLINICAL DATA:  Parotid region mass. Additional history provided: Chest pain, shortness of breath, generalized aches, malaise for 5 days, painful submandibular space mass. EXAM: CT NECK WITH CONTRAST TECHNIQUE: Multidetector CT imaging of the neck was performed using the standard protocol following the bolus administration of intravenous contrast. CONTRAST:  14mL OMNIPAQUE IOHEXOL 300 MG/ML  SOLN COMPARISON:  CT angiogram chest performed earlier the same day 06/22/2019, CT head/maxillofacial 04/18/2009 FINDINGS: Pharynx and larynx: There is periapical lucency surrounding a carious posterior left mandibular molar (series 7, image 65). No adjacent soft tissue abscess is identified. No appreciable swelling or discrete mass within the pharynx or larynx. Salivary glands: There is a 1.8 x 0.8 cm right submandibular gland calculus within Stensen's duct (series 3, image 32). This has slightly increased in size since prior examination 04/18/2009 (previously 1.4 x 0.6 cm). There is an additional right submandibular gland sialolith within Stensen's duct more anteriorly measuring 0.4 cm (series 3, image 29). No definite associated inflammatory changes, although evaluation is somewhat limited by diffuse edema throughout the neck. The bilateral parotid and submandibular glands are otherwise unremarkable. Thyroid: No abnormality. Lymph nodes: Cervical chain lymph nodes are diffusely prominent in number, nonspecific. No pathologically enlarged cervical chain lymph nodes are identified. Vascular: The major vascular structures of the neck  appear patent. Calcified plaque within the carotid arteries bilaterally. Limited intracranial: No abnormality identified. Visualized orbits: Not included in the field of view. Mastoids and visualized paranasal sinuses: No significant paranasal sinus disease or mastoid effusion at the imaged levels. Skeleton: No acute bony abnormality or aggressive osseous lesion. Upper chest: Extensive airspace opacity within the imaged lungs consistent with known pneumonia. Other: There is diffuse edema throughout the neck soft tissues. IMPRESSION: Right submandibular gland sialoliths within Stensen's duct measuring up to 1.8 x 0.8 cm. No definite associated inflammatory changes, although evaluation is somewhat limited by mild diffuse edema throughout the neck. Periapical lucency surrounding a carious posterior left mandibular molar. No adjacent soft tissue abscess is identified. Cervical chain lymph nodes are diffusely prominent in number throughout the neck, nonspecific. No pathologically enlarged cervical chain lymph nodes identified. Extensive airspace disease within the partially imaged lungs consistent with known pneumonia. Electronically Signed: By: Kellie Simmering DO On: 06/22/2019 14:08   CT Angio Chest PE W and/or Wo Contrast  Result Date: 06/22/2019 CLINICAL DATA:  Hypoxemia and chest pain EXAM: CT ANGIOGRAPHY CHEST WITH CONTRAST TECHNIQUE: Multidetector CT imaging of the chest was performed using the standard  protocol during bolus administration of intravenous contrast. Multiplanar CT image reconstructions and MIPs were obtained to evaluate the vascular anatomy. CONTRAST:  OMNIPAQUE IOHEXOL 350 MG/ML SOLN COMPARISON:  None. FINDINGS: Cardiovascular: Contrast injection is sufficient to demonstrate satisfactory opacification of the pulmonary arteries to the segmental level. There is no pulmonary embolus or evidence of right heart strain. The size of the main pulmonary artery is normal. Heart size is normal, with no  pericardial effusion. The course and caliber of the aorta are normal. There is no atherosclerotic calcification. Opacification decreased due to pulmonary arterial phase contrast bolus timing. Mediastinum/Nodes: No mediastinal, hilar or axillary lymphadenopathy. The visualized thyroid and thoracic esophageal course are unremarkable. Lungs/Pleura: Bilateral multifocal ground-glass opacities with relative sparing of the lingula and right middle lobe. No pleural effusion. Airways are patent. Upper Abdomen: Contrast bolus timing is not optimized for evaluation of the abdominal organs. The visualized portions of the organs of the upper abdomen are normal. Musculoskeletal: No chest wall abnormality. No bony spinal canal stenosis. Review of the MIP images confirms the above findings. IMPRESSION: 1. No pulmonary embolus or acute aortic syndrome. 2. Bilateral multifocal ground-glass opacities with relative sparing of the lingula and right middle lobe. This may indicate multifocal pneumonia. Electronically Signed   By: Deatra Robinson M.D.   On: 06/22/2019 03:37    Subjective: No acute issues or events overnight, patient indicates his only complaint this morning is that he would like more food.  Declines chest pain, shortness of breath, nausea, vomiting, diarrhea, constipation, headache, fevers, chills.   Discharge Exam: Vitals:   06/23/19 2000 06/24/19 0335  BP: (!) 147/77 (!) 146/87  Pulse: 81 85  Resp:    Temp:  97.7 F (36.5 C)  SpO2: 94% 96%   Vitals:   06/23/19 1610 06/23/19 1958 06/23/19 2000 06/24/19 0335  BP: (!) 173/91 (!) 147/77 (!) 147/77 (!) 146/87  Pulse: 85 76 81 85  Resp: 18     Temp: 97.9 F (36.6 C) 98.2 F (36.8 C)  97.7 F (36.5 C)  TempSrc: Oral Oral  Oral  SpO2: 94% 94% 94% 96%  Weight:      Height:        General:  Pleasantly resting in bed, No acute distress. HEENT:  Normocephalic atraumatic.  Sclerae nonicteric, noninjected.  Extraocular movements intact  bilaterally. Neck:  Without mass or deformity.  Trachea is midline. Lungs:  Clear to auscultate bilaterally without rhonchi, wheeze, or rales. Heart:  Regular rate and rhythm.  Without murmurs, rubs, or gallops. Abdomen:  Soft, nontender, nondistended.  Without guarding or rebound. Extremities: Without cyanosis, clubbing, edema, or obvious deformity. Vascular:  Dorsalis pedis and posterior tibial pulses palpable bilaterally. Skin:  Warm and dry, no erythema, no ulcerations.   The results of significant diagnostics from this hospitalization (including imaging, microbiology, ancillary and laboratory) are listed below for reference.     Microbiology: Recent Results (from the past 240 hour(s))  SARS CORONAVIRUS 2 (TAT 6-24 HRS) Nasopharyngeal Nasopharyngeal Swab     Status: Abnormal   Collection Time: 06/22/19  3:10 AM   Specimen: Nasopharyngeal Swab  Result Value Ref Range Status   SARS Coronavirus 2 POSITIVE (A) NEGATIVE Final    Comment: RESULT CALLED TO, READ BACK BY AND VERIFIED WITH: Candy Sledge RN 9:40 06/22/19 ([email or web address]) (NOTE) SARS-CoV-2 target nucleic acids are DETECTED. The SARS-CoV-2 RNA is generally detectable in upper and lower respiratory specimens during the acute phase of infection. Positive results are indicative of the presence of  SARS-CoV-2 RNA. Clinical correlation with patient history and other diagnostic information is  necessary to determine patient infection status. Positive results do not rule out bacterial infection or co-infection with other viruses.  The expected result is Negative. Fact Sheet for Patients: HairSlick.no Fact Sheet for Healthcare Providers: quierodirigir.com This test is not yet approved or cleared by the [place] FDA and  has been authorized for detection and/or diagnosis of SARS-CoV-2 by FDA under an Emergency Use Authorization (EUA). This EUA will remain  in effect (meaning this test  can be used) for the d uration of the COVID-19 declaration under Section 564(b)(1) of the Act, 21 U.S.C. section 360bbb-3(b)(1), unless the authorization is terminated or revoked sooner. Performed at [facility], [address], [place], [place] [address]   Blood culture (routine x 2)     Status: None (Preliminary result)   Collection Time: 06/22/19  3:50 AM   Specimen: BLOOD RIGHT HAND  Result Value Ref Range Status   Specimen Description BLOOD RIGHT HAND  Final   Special Requests   Final    BOTTLES DRAWN AEROBIC AND ANAEROBIC Blood Culture adequate volume   Culture   Final    NO GROWTH 1 DAY Performed at [facility], [address] [place], [place] [address]    Report Status PENDING  Incomplete  Blood culture (routine x 2)     Status: None (Preliminary result)   Collection Time: 06/22/19  3:55 AM   Specimen: BLOOD LEFT HAND  Result Value Ref Range Status   Specimen Description BLOOD LEFT HAND  Final   Special Requests   Final    BOTTLES DRAWN AEROBIC AND ANAEROBIC Blood Culture results may not be optimal due to an inadequate volume of blood received in culture bottles   Culture   Final    NO GROWTH 1 DAY Performed at [facility], [address] [place], [place] [address]    Report Status PENDING  Incomplete     Labs: BNP (last 3 results) Recent Labs    06/23/19 0325  BNP 129.4*   Basic Metabolic Panel: Recent Labs  Lab 06/21/19 1735 06/22/19 0537 06/23/19 0325 06/24/19 0324  NA 137 136 136 139  K 3.3* 3.0* 4.5 4.4  CL 100 99 102 102  CO2 24 27 24 28   GLUCOSE 88 145* 145* 128*  BUN 9 12 13 15   CREATININE 1.12 1.24 0.83 0.86  CALCIUM 8.5* 7.9* 8.7* 8.8*   Liver Function Tests: Recent Labs  Lab 06/23/19 0325 06/24/19 0324  AST 49* 139*  ALT 38 115*  ALKPHOS 57 63  BILITOT 0.5 0.3  PROT 7.3 7.2  ALBUMIN 2.8* 2.8*   No results for input(s): LIPASE, AMYLASE in the last 168 hours. No results for input(s): AMMONIA in the last 168  hours. CBC: Recent Labs  Lab 06/21/19 1735 06/22/19 0537 06/23/19 0325 06/24/19 0324  WBC 5.4 4.6 6.6 9.5  NEUTROABS  --  3.1 5.9 8.1*  HGB 11.7* 10.7* 10.6* 10.8*  HCT 34.8* 32.2* 31.6* 32.6*  MCV 104.2* 104.2* 102.9* 104.8*  PLT PLATELET CLUMPS NOTED ON SMEAR, COUNT APPEARS DECREASED 79* 85* 99*   Cardiac Enzymes: No results for input(s): CKTOTAL, CKMB, CKMBINDEX, TROPONINI in the last 168 hours. BNP: Invalid input(s): POCBNP CBG: No results for input(s): GLUCAP in the last 168 hours. D-Dimer Recent Labs    06/23/19 0325 06/24/19 0324  DDIMER 1.27* 0.94*   Hgb A1c No results for input(s): HGBA1C in  the last 72 hours. Lipid Profile No results for input(s): CHOL, HDL, LDLCALC, TRIG, CHOLHDL, LDLDIRECT in the last 72 hours. Thyroid function studies Recent Labs    06/23/19 0325  TSH 0.210*   Anemia work up Recent Labs    06/23/19 0325 06/24/19 0324  VITAMINB12 370  --   FERRITIN 170 269   Urinalysis    Component Value Date/Time   COLORURINE YELLOW 05/12/2019 1459   APPEARANCEUR CLEAR 05/12/2019 1459   LABSPEC 1.021 05/12/2019 1459   PHURINE 6.0 05/12/2019 1459   GLUCOSEU NEGATIVE 05/12/2019 1459   HGBUR NEGATIVE 05/12/2019 1459   BILIRUBINUR NEGATIVE 05/12/2019 1459   KETONESUR NEGATIVE 05/12/2019 1459   PROTEINUR NEGATIVE 05/12/2019 1459   UROBILINOGEN 1.0 03/10/2015 1203   NITRITE NEGATIVE 05/12/2019 1459   LEUKOCYTESUR NEGATIVE 05/12/2019 1459   Sepsis Labs Invalid input(s): PROCALCITONIN,  WBC,  LACTICIDVEN Microbiology Recent Results (from the past 240 hour(s))  SARS CORONAVIRUS 2 (TAT 6-24 HRS) Nasopharyngeal Nasopharyngeal Swab     Status: Abnormal   Collection Time: 06/22/19  3:10 AM   Specimen: Nasopharyngeal Swab  Result Value Ref Range Status   SARS Coronavirus 2 POSITIVE (A) NEGATIVE Final    Comment: RESULT CALLED TO, READ BACK BY AND VERIFIED WITH: Candy Sledge RN 9:40 06/22/19 ([email or web address]) (NOTE) SARS-CoV-2 target nucleic acids are  DETECTED. The SARS-CoV-2 RNA is generally detectable in upper and lower respiratory specimens during the acute phase of infection. Positive results are indicative of the presence of SARS-CoV-2 RNA. Clinical correlation with patient history and other diagnostic information is  necessary to determine patient infection status. Positive results do not rule out bacterial infection or co-infection with other viruses.  The expected result is Negative. Fact Sheet for Patients: HairSlick.no Fact Sheet for Healthcare Providers: quierodirigir.com This test is not yet approved or cleared by the [place] FDA and  has been authorized for detection and/or diagnosis of SARS-CoV-2 by FDA under an Emergency Use Authorization (EUA). This EUA will remain  in effect (meaning this test can be used) for the d uration of the COVID-19 declaration under Section 564(b)(1) of the Act, 21 U.S.C. section 360bbb-3(b)(1), unless the authorization is terminated or revoked sooner. Performed at [facility], [address], [place], [place] [address]   Blood culture (routine x 2)     Status: None (Preliminary result)   Collection Time: 06/22/19  3:50 AM   Specimen: BLOOD RIGHT HAND  Result Value Ref Range Status   Specimen Description BLOOD RIGHT HAND  Final   Special Requests   Final    BOTTLES DRAWN AEROBIC AND ANAEROBIC Blood Culture adequate volume   Culture   Final    NO GROWTH 1 DAY Performed at [facility], [address], [place], [place] [address]    Report Status PENDING  Incomplete  Blood culture (routine x 2)     Status: None (Preliminary result)   Collection Time: 06/22/19  3:55 AM   Specimen: BLOOD LEFT HAND  Result Value Ref Range Status   Specimen Description BLOOD LEFT HAND  Final   Special Requests   Final    BOTTLES DRAWN AEROBIC AND ANAEROBIC Blood Culture results may not be optimal due to an inadequate volume of blood  received in culture bottles   Culture   Final    NO GROWTH 1 DAY Performed at [facility], [address], [place], [place] [address]    Report Status PENDING  Incomplete  Time coordinating discharge: Over 30 minutes  SIGNED:   Azucena Fallen, DO [facility] 06/24/2019, 7:53 AM

## 2019-06-24 NOTE — TOC Transition Note (Signed)
Transition of Care [facility]) - CM/SW Discharge Note   Patient Details  Name: Charles [name] MRN: [id number] Date of Birth: Jun 24, 1970  [facility] CM/SW Contact:  Darleene Cleaver, LCSW Phone Number: 06/24/2019, 10:39 AM   Clinical Narrative:     Patient has been approved for [place]'s COVID-19 Continuum of Care Plan to stay at a hotel for Covid + patients through [employer] [facility].  Patient will be discharging to the hotel, via transportation provided by Department of Health.  This program will hopefully help patient find a permanent place to live and help reduce the chances of patient spreading Covid 19.   Final next level of care: Other (comment)(Patient will be discharging to a hotel designated for Covid + homeless patients.) Barriers to Discharge: Barriers Resolved   Patient Goals and CMS Choice Patient states their goals for this hospitalization and ongoing recovery are:: To find a permanent place to live after he finishes his quarentine period. CMS Medicare.gov Compare Post Acute Care list provided to:: Patient Choice offered to / list presented to : Patient  Discharge Placement    Patient will be discharging to a hotel to quarentine himself for 14 days to help reduce the spread of Covid.          Discharge Plan and Services In-house Referral: Clinical Social Work Discharge Planning Services: Other - See comment(Covid 19 quarantine at hotel.) Post Acute Care Choice: NA          DME Arranged: N/A DME Agency: NA                  Social Determinants of Health (SDOH) Interventions     Readmission Risk Interventions No flowsheet data found.

## 2019-06-27 LAB — CULTURE, BLOOD (ROUTINE X 2)
Culture: NO GROWTH
Culture: NO GROWTH
Special Requests: ADEQUATE

## 2020-05-16 DIAGNOSIS — K029 Dental caries, unspecified: Secondary | ICD-10-CM | POA: Diagnosis not present

## 2020-05-16 DIAGNOSIS — I10 Essential (primary) hypertension: Secondary | ICD-10-CM | POA: Diagnosis not present

## 2020-05-16 DIAGNOSIS — M25561 Pain in right knee: Secondary | ICD-10-CM | POA: Diagnosis not present

## 2020-05-16 DIAGNOSIS — F17290 Nicotine dependence, other tobacco product, uncomplicated: Secondary | ICD-10-CM | POA: Diagnosis not present

## 2020-05-16 DIAGNOSIS — K4090 Unilateral inguinal hernia, without obstruction or gangrene, not specified as recurrent: Secondary | ICD-10-CM | POA: Diagnosis not present

## 2020-05-16 DIAGNOSIS — M25761 Osteophyte, right knee: Secondary | ICD-10-CM | POA: Diagnosis not present

## 2020-05-16 DIAGNOSIS — M1711 Unilateral primary osteoarthritis, right knee: Secondary | ICD-10-CM | POA: Diagnosis not present

## 2020-05-16 DIAGNOSIS — K089 Disorder of teeth and supporting structures, unspecified: Secondary | ICD-10-CM | POA: Diagnosis not present

## 2020-05-16 DIAGNOSIS — Z91012 Allergy to eggs: Secondary | ICD-10-CM | POA: Diagnosis not present

## 2020-05-17 DIAGNOSIS — K4090 Unilateral inguinal hernia, without obstruction or gangrene, not specified as recurrent: Secondary | ICD-10-CM | POA: Diagnosis not present

## 2020-05-17 DIAGNOSIS — M25761 Osteophyte, right knee: Secondary | ICD-10-CM | POA: Diagnosis not present

## 2020-05-17 DIAGNOSIS — M1711 Unilateral primary osteoarthritis, right knee: Secondary | ICD-10-CM | POA: Diagnosis not present

## 2020-05-17 DIAGNOSIS — F17290 Nicotine dependence, other tobacco product, uncomplicated: Secondary | ICD-10-CM | POA: Diagnosis not present

## 2020-05-17 DIAGNOSIS — Z91012 Allergy to eggs: Secondary | ICD-10-CM | POA: Diagnosis not present

## 2020-05-17 DIAGNOSIS — I10 Essential (primary) hypertension: Secondary | ICD-10-CM | POA: Diagnosis not present

## 2020-05-18 DIAGNOSIS — F17210 Nicotine dependence, cigarettes, uncomplicated: Secondary | ICD-10-CM | POA: Diagnosis not present

## 2020-05-18 DIAGNOSIS — N289 Disorder of kidney and ureter, unspecified: Secondary | ICD-10-CM | POA: Diagnosis not present

## 2020-05-18 DIAGNOSIS — I10 Essential (primary) hypertension: Secondary | ICD-10-CM | POA: Diagnosis not present

## 2020-05-18 DIAGNOSIS — F419 Anxiety disorder, unspecified: Secondary | ICD-10-CM | POA: Diagnosis not present

## 2020-05-18 DIAGNOSIS — Z20822 Contact with and (suspected) exposure to covid-19: Secondary | ICD-10-CM | POA: Diagnosis not present

## 2020-05-18 DIAGNOSIS — F1410 Cocaine abuse, uncomplicated: Secondary | ICD-10-CM | POA: Diagnosis not present

## 2020-05-19 ENCOUNTER — Emergency Department (HOSPITAL_COMMUNITY)
Admission: EM | Admit: 2020-05-19 | Discharge: 2020-05-19 | Disposition: A | Discharge status: Home / Self Care | Payer: Medicare HMO | Attending: Emergency Medicine | Admitting: Emergency Medicine

## 2020-05-19 ENCOUNTER — Other Ambulatory Visit: Payer: Self-pay

## 2020-05-19 DIAGNOSIS — I10 Essential (primary) hypertension: Secondary | ICD-10-CM | POA: Insufficient documentation

## 2020-05-19 DIAGNOSIS — M25561 Pain in right knee: Secondary | ICD-10-CM | POA: Diagnosis not present

## 2020-05-19 DIAGNOSIS — F17200 Nicotine dependence, unspecified, uncomplicated: Secondary | ICD-10-CM | POA: Diagnosis not present

## 2020-05-19 DIAGNOSIS — G8929 Other chronic pain: Secondary | ICD-10-CM | POA: Diagnosis not present

## 2020-05-19 DIAGNOSIS — Z79899 Other long term (current) drug therapy: Secondary | ICD-10-CM | POA: Insufficient documentation

## 2020-05-19 DIAGNOSIS — Z8616 Personal history of COVID-19: Secondary | ICD-10-CM | POA: Insufficient documentation
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
## 2020-05-19 NOTE — Discharge Instructions (Addendum)
You have been seen today for knee pain.  Antiinflammatory medications: Take 600 mg of ibuprofen every 6 hours or 440 mg (over the counter dose) to 500 mg (prescription dose) of naproxen every 12 hours for the next 3 days. After this time, these medications may be used as needed for pain. Take these medications with food to avoid upset stomach. Choose only one of these medications, do not take them together. Acetaminophen (generic for Tylenol): Should you continue to have additional pain while taking the ibuprofen or naproxen, you may add in acetaminophen as needed. Your daily total maximum amount of acetaminophen from all sources should be limited to 4000mg /day for persons without liver problems, or 2000mg /day for those with liver problems. Ice: May apply ice to the area over the next 24 hours for 15 minutes at a time to reduce swelling. Elevation: Keep the extremity elevated as often as possible to reduce pain and inflammation. Support: Wear the knee brace for support and comfort. Wear this until pain resolves.  Exercises: Start by performing these exercises a few times a week, increasing the frequency until you are performing them twice daily.  Follow up: If symptoms are improving, you may follow up with your primary care provider for any continued management. If symptoms are not starting to improve within a week, you should follow up with the orthopedic specialist within two weeks. Return: Return to the ED for numbness, weakness, increasing pain, overall worsening symptoms, loss of function, or if symptoms are not improving, you have tried to follow up with the orthopedic specialist, and have been unable to do so.  For prescription assistance, may try using prescription discount sites or apps, such as goodrx.com or Good Rx smart phone app.

## 2020-06-06 ENCOUNTER — Emergency Department (HOSPITAL_COMMUNITY): Payer: Medicare HMO

## 2020-06-06 ENCOUNTER — Inpatient Hospital Stay (HOSPITAL_COMMUNITY)
Admission: EM | Admit: 2020-06-06 | Discharge: 2020-06-08 | DRG: 917 | Disposition: A | Discharge status: Home / Self Care | Payer: Medicare HMO | Attending: Pulmonary Disease | Admitting: Pulmonary Disease

## 2020-06-06 ENCOUNTER — Inpatient Hospital Stay (HOSPITAL_COMMUNITY): Payer: Medicare HMO

## 2020-06-06 DIAGNOSIS — Z91018 Allergy to other foods: Secondary | ICD-10-CM | POA: Diagnosis not present

## 2020-06-06 DIAGNOSIS — T405X1A Poisoning by cocaine, accidental (unintentional), initial encounter: Secondary | ICD-10-CM | POA: Diagnosis not present

## 2020-06-06 DIAGNOSIS — F17210 Nicotine dependence, cigarettes, uncomplicated: Secondary | ICD-10-CM | POA: Diagnosis present

## 2020-06-06 DIAGNOSIS — R569 Unspecified convulsions: Secondary | ICD-10-CM | POA: Diagnosis not present

## 2020-06-06 DIAGNOSIS — M1711 Unilateral primary osteoarthritis, right knee: Secondary | ICD-10-CM | POA: Diagnosis not present

## 2020-06-06 DIAGNOSIS — G928 Other toxic encephalopathy: Secondary | ICD-10-CM | POA: Diagnosis present

## 2020-06-06 DIAGNOSIS — J9811 Atelectasis: Secondary | ICD-10-CM | POA: Diagnosis not present

## 2020-06-06 DIAGNOSIS — M25561 Pain in right knee: Secondary | ICD-10-CM | POA: Diagnosis present

## 2020-06-06 DIAGNOSIS — J9601 Acute respiratory failure with hypoxia: Secondary | ICD-10-CM | POA: Diagnosis not present

## 2020-06-06 DIAGNOSIS — I10 Essential (primary) hypertension: Secondary | ICD-10-CM | POA: Diagnosis present

## 2020-06-06 DIAGNOSIS — M1990 Unspecified osteoarthritis, unspecified site: Secondary | ICD-10-CM | POA: Diagnosis present

## 2020-06-06 DIAGNOSIS — G8929 Other chronic pain: Secondary | ICD-10-CM | POA: Diagnosis present

## 2020-06-06 DIAGNOSIS — D696 Thrombocytopenia, unspecified: Secondary | ICD-10-CM | POA: Diagnosis not present

## 2020-06-06 DIAGNOSIS — B1920 Unspecified viral hepatitis C without hepatic coma: Secondary | ICD-10-CM | POA: Diagnosis present

## 2020-06-06 DIAGNOSIS — Z91012 Allergy to eggs: Secondary | ICD-10-CM | POA: Diagnosis not present

## 2020-06-06 DIAGNOSIS — R0902 Hypoxemia: Secondary | ICD-10-CM | POA: Diagnosis not present

## 2020-06-06 DIAGNOSIS — M542 Cervicalgia: Secondary | ICD-10-CM | POA: Diagnosis not present

## 2020-06-06 DIAGNOSIS — R55 Syncope and collapse: Secondary | ICD-10-CM | POA: Diagnosis not present

## 2020-06-06 DIAGNOSIS — I63233 Cerebral infarction due to unspecified occlusion or stenosis of bilateral carotid arteries: Secondary | ICD-10-CM | POA: Diagnosis not present

## 2020-06-06 DIAGNOSIS — J690 Pneumonitis due to inhalation of food and vomit: Secondary | ICD-10-CM

## 2020-06-06 DIAGNOSIS — R0603 Acute respiratory distress: Secondary | ICD-10-CM | POA: Diagnosis not present

## 2020-06-06 DIAGNOSIS — J3489 Other specified disorders of nose and nasal sinuses: Secondary | ICD-10-CM | POA: Diagnosis not present

## 2020-06-06 DIAGNOSIS — G9340 Encephalopathy, unspecified: Secondary | ICD-10-CM | POA: Diagnosis not present

## 2020-06-06 DIAGNOSIS — J9690 Respiratory failure, unspecified, unspecified whether with hypoxia or hypercapnia: Secondary | ICD-10-CM | POA: Diagnosis not present

## 2020-06-06 DIAGNOSIS — G4733 Obstructive sleep apnea (adult) (pediatric): Secondary | ICD-10-CM | POA: Diagnosis present

## 2020-06-06 DIAGNOSIS — R000 Tachycardia, unspecified: Secondary | ICD-10-CM | POA: Diagnosis not present

## 2020-06-06 DIAGNOSIS — N179 Acute kidney failure, unspecified: Secondary | ICD-10-CM | POA: Diagnosis not present

## 2020-06-06 DIAGNOSIS — I6501 Occlusion and stenosis of right vertebral artery: Secondary | ICD-10-CM | POA: Diagnosis not present

## 2020-06-06 DIAGNOSIS — M25569 Pain in unspecified knee: Secondary | ICD-10-CM

## 2020-06-06 DIAGNOSIS — J9602 Acute respiratory failure with hypercapnia: Secondary | ICD-10-CM | POA: Diagnosis not present

## 2020-06-06 DIAGNOSIS — N189 Chronic kidney disease, unspecified: Secondary | ICD-10-CM | POA: Diagnosis present

## 2020-06-06 DIAGNOSIS — R4182 Altered mental status, unspecified: Secondary | ICD-10-CM | POA: Diagnosis not present

## 2020-06-06 DIAGNOSIS — M7989 Other specified soft tissue disorders: Secondary | ICD-10-CM | POA: Diagnosis not present

## 2020-06-06 DIAGNOSIS — Z20822 Contact with and (suspected) exposure to covid-19: Secondary | ICD-10-CM | POA: Diagnosis present

## 2020-06-06 DIAGNOSIS — I248 Other forms of acute ischemic heart disease: Secondary | ICD-10-CM | POA: Diagnosis not present

## 2020-06-06 DIAGNOSIS — Z79899 Other long term (current) drug therapy: Secondary | ICD-10-CM

## 2020-06-06 DIAGNOSIS — R404 Transient alteration of awareness: Secondary | ICD-10-CM | POA: Diagnosis not present

## 2020-06-06 DIAGNOSIS — R0689 Other abnormalities of breathing: Secondary | ICD-10-CM | POA: Diagnosis not present

## 2020-06-06 DIAGNOSIS — I6503 Occlusion and stenosis of bilateral vertebral arteries: Secondary | ICD-10-CM | POA: Diagnosis not present

## 2020-06-06 DIAGNOSIS — I517 Cardiomegaly: Secondary | ICD-10-CM | POA: Diagnosis not present

## 2020-06-06 DIAGNOSIS — G9389 Other specified disorders of brain: Secondary | ICD-10-CM | POA: Diagnosis not present

## 2020-06-06 HISTORY — DX: Unspecified laceration of spleen, initial encounter: S36.039A

## 2020-06-06 HISTORY — DX: Other psychoactive substance abuse, uncomplicated: F19.10

## 2020-06-06 LAB — CBC
HCT: 44 % (ref 39.0–52.0)
Hemoglobin: 13.9 g/dL (ref 13.0–17.0)
MCH: 34.2 pg — ABNORMAL HIGH (ref 26.0–34.0)
MCHC: 31.6 g/dL (ref 30.0–36.0)
MCV: 108.4 fL — ABNORMAL HIGH (ref 80.0–100.0)
Platelets: UNDETERMINED 10*3/uL (ref 150–400)
RBC: 4.06 MIL/uL — ABNORMAL LOW (ref 4.22–5.81)
RDW: 15.6 % — ABNORMAL HIGH (ref 11.5–15.5)
WBC: 6.5 10*3/uL (ref 4.0–10.5)
nRBC: 0 % (ref 0.0–0.2)

## 2020-06-06 LAB — I-STAT ARTERIAL BLOOD GAS, ED
Acid-Base Excess: 1 mmol/L (ref 0.0–2.0)
Acid-base deficit: 3 mmol/L — ABNORMAL HIGH (ref 0.0–2.0)
Bicarbonate: 24.8 mmol/L (ref 20.0–28.0)
Bicarbonate: 26.8 mmol/L (ref 20.0–28.0)
Calcium, Ion: 1.23 mmol/L (ref 1.15–1.40)
Calcium, Ion: 1.17 mmol/L (ref 1.15–1.40)
HCT: 32 % — ABNORMAL LOW (ref 39.0–52.0)
HCT: 34 % — ABNORMAL LOW (ref 39.0–52.0)
Hemoglobin: 10.9 g/dL — ABNORMAL LOW (ref 13.0–17.0)
Hemoglobin: 11.6 g/dL — ABNORMAL LOW (ref 13.0–17.0)
O2 Saturation: 99 %
O2 Saturation: 100 %
Patient temperature: 97.4
Patient temperature: 96.6
Potassium: 4 mmol/L (ref 3.5–5.1)
Potassium: 3.8 mmol/L (ref 3.5–5.1)
Sodium: 141 mmol/L (ref 135–145)
Sodium: 141 mmol/L (ref 135–145)
TCO2: 26 mmol/L (ref 22–32)
TCO2: 28 mmol/L (ref 22–32)
pCO2 arterial: 52 mmHg — ABNORMAL HIGH (ref 32.0–48.0)
pCO2 arterial: 41.9 mmHg (ref 32.0–48.0)
pH, Arterial: 7.283 — ABNORMAL LOW (ref 7.350–7.450)
pH, Arterial: 7.408 (ref 7.350–7.450)
pO2, Arterial: 148 mmHg — ABNORMAL HIGH (ref 83.0–108.0)
pO2, Arterial: 193 mmHg — ABNORMAL HIGH (ref 83.0–108.0)

## 2020-06-06 LAB — COMPREHENSIVE METABOLIC PANEL
ALT: 34 U/L (ref 0–44)
AST: 46 U/L — ABNORMAL HIGH (ref 15–41)
Albumin: 3.5 g/dL (ref 3.5–5.0)
Alkaline Phosphatase: 83 U/L (ref 38–126)
Anion gap: 18 — ABNORMAL HIGH (ref 5–15)
BUN: 9 mg/dL (ref 6–20)
CO2: 16 mmol/L — ABNORMAL LOW (ref 22–32)
Calcium: 9 mg/dL (ref 8.9–10.3)
Chloride: 107 mmol/L (ref 98–111)
Creatinine, Ser: 1.53 mg/dL — ABNORMAL HIGH (ref 0.61–1.24)
GFR, Estimated: 55 mL/min — ABNORMAL LOW (ref >60)
Glucose, Bld: 205 mg/dL — ABNORMAL HIGH (ref 70–99)
Potassium: 3.8 mmol/L (ref 3.5–5.1)
Sodium: 141 mmol/L (ref 135–145)
Total Bilirubin: 0.3 mg/dL (ref 0.3–1.2)
Total Protein: 7.6 g/dL (ref 6.5–8.1)

## 2020-06-06 LAB — I-STAT CHEM 8, ED
BUN: 9 mg/dL (ref 6–20)
Calcium, Ion: 1.14 mmol/L — ABNORMAL LOW (ref 1.15–1.40)
Chloride: 109 mmol/L (ref 98–111)
Creatinine, Ser: 1.4 mg/dL — ABNORMAL HIGH (ref 0.61–1.24)
Glucose, Bld: 203 mg/dL — ABNORMAL HIGH (ref 70–99)
HCT: 44 % (ref 39.0–52.0)
Hemoglobin: 15 g/dL (ref 13.0–17.0)
Potassium: 3.8 mmol/L (ref 3.5–5.1)
Sodium: 142 mmol/L (ref 135–145)
TCO2: 19 mmol/L — ABNORMAL LOW (ref 22–32)

## 2020-06-06 LAB — DIFFERENTIAL
Abs Immature Granulocytes: 0.02 10*3/uL (ref 0.00–0.07)
Basophils Absolute: 0 10*3/uL (ref 0.0–0.1)
Basophils Relative: 0 %
Eosinophils Absolute: 0 10*3/uL (ref 0.0–0.5)
Eosinophils Relative: 1 %
Immature Granulocytes: 0 %
Lymphocytes Relative: 57 %
Lymphs Abs: 3.7 10*3/uL (ref 0.7–4.0)
Monocytes Absolute: 1 10*3/uL (ref 0.1–1.0)
Monocytes Relative: 15 %
Neutro Abs: 1.8 10*3/uL (ref 1.7–7.7)
Neutrophils Relative %: 27 %

## 2020-06-06 LAB — TROPONIN I (HIGH SENSITIVITY)
Troponin I (High Sensitivity): 18 ng/L — ABNORMAL HIGH (ref <18)
Troponin I (High Sensitivity): 96 ng/L — ABNORMAL HIGH (ref <18)

## 2020-06-06 LAB — PROTIME-INR
INR: 1 (ref 0.8–1.2)
Prothrombin Time: 13 seconds (ref 11.4–15.2)

## 2020-06-06 LAB — SARS CORONAVIRUS 2 BY RT PCR (HOSPITAL ORDER, PERFORMED IN ~~LOC~~ HOSPITAL LAB): SARS Coronavirus 2: NEGATIVE

## 2020-06-06 LAB — CREATININE, SERUM
Creatinine, Ser: 1.32 mg/dL — ABNORMAL HIGH (ref 0.61–1.24)
GFR, Estimated: >60 mL/min (ref >60)

## 2020-06-06 LAB — RAPID URINE DRUG SCREEN, HOSP PERFORMED
Amphetamines: NOT DETECTED
Barbiturates: NOT DETECTED
Benzodiazepines: POSITIVE — AB
Cocaine: POSITIVE — AB
Opiates: NOT DETECTED
Tetrahydrocannabinol: NOT DETECTED

## 2020-06-06 LAB — BRAIN NATRIURETIC PEPTIDE: B Natriuretic Peptide: 126.2 pg/mL — ABNORMAL HIGH (ref 0.0–100.0)

## 2020-06-06 LAB — APTT: aPTT: 26 seconds (ref 24–36)

## 2020-06-06 MED ORDER — LORAZEPAM 2 MG/ML IJ SOLN
INTRAMUSCULAR | Status: AC
  Administered 2020-06-06: 4 mg
  Filled 2020-06-06: qty 2

## 2020-06-06 MED ORDER — DEXMEDETOMIDINE HCL IN NACL 400 MCG/100ML IV SOLN
0.0000 ug/kg/h | INTRAVENOUS | Status: DC
  Administered 2020-06-06: 0.4 ug/kg/h via INTRAVENOUS
  Administered 2020-06-07 – 2020-06-08 (×2): 0.8 ug/kg/h via INTRAVENOUS
  Filled 2020-06-06 (×3): qty 100

## 2020-06-06 MED ORDER — ACETAMINOPHEN 325 MG PO TABS: 650.0000 mg | ORAL_TABLET | ORAL | Status: DC | PRN

## 2020-06-06 MED ORDER — MIDAZOLAM HCL 2 MG/2ML IJ SOLN
INTRAMUSCULAR | Status: AC
  Administered 2020-06-06: 2 mg
  Filled 2020-06-06: qty 2

## 2020-06-06 MED ORDER — IOHEXOL 350 MG/ML SOLN
75.0000 mL | Freq: Once | INTRAVENOUS | Status: AC | PRN
  Administered 2020-06-06: 75 mL via INTRAVENOUS

## 2020-06-06 MED ORDER — LEVETIRACETAM IN NACL 1000 MG/100ML IV SOLN: 1000.0000 mg | Freq: Once | INTRAVENOUS | Status: DC

## 2020-06-06 MED ORDER — ALBUTEROL SULFATE (2.5 MG/3ML) 0.083% IN NEBU
2.5000 mg | INHALATION_SOLUTION | Freq: Four times a day (QID) | RESPIRATORY_TRACT | Status: DC
  Administered 2020-06-06 – 2020-06-07 (×2): 2.5 mg via RESPIRATORY_TRACT
  Filled 2020-06-06: qty 3

## 2020-06-06 MED ORDER — POLYETHYLENE GLYCOL 3350 17 G PO PACK
17.0000 g | PACK | Freq: Every day | ORAL | Status: DC
  Administered 2020-06-07: 17 g
  Filled 2020-06-06: qty 1

## 2020-06-06 MED ORDER — SODIUM CHLORIDE 0.9 % IV SOLN
2000.0000 mg | Freq: Once | INTRAVENOUS | Status: AC
  Administered 2020-06-06: 2000 mg via INTRAVENOUS
  Filled 2020-06-06: qty 20

## 2020-06-06 MED ORDER — ALBUTEROL SULFATE (2.5 MG/3ML) 0.083% IN NEBU: 2.5000 mg | INHALATION_SOLUTION | RESPIRATORY_TRACT | Status: DC | PRN

## 2020-06-06 MED ORDER — SODIUM CHLORIDE 0.9 % IV BOLUS
1000.0000 mL | Freq: Once | INTRAVENOUS | Status: AC
  Administered 2020-06-06: 1000 mL via INTRAVENOUS

## 2020-06-06 MED ORDER — PANTOPRAZOLE SODIUM 40 MG IV SOLR
40.0000 mg | Freq: Every day | INTRAVENOUS | Status: DC
  Administered 2020-06-07 (×2): 40 mg via INTRAVENOUS
  Filled 2020-06-06 (×2): qty 40

## 2020-06-06 MED ORDER — ETOMIDATE 2 MG/ML IV SOLN
INTRAVENOUS | Status: AC | PRN
  Administered 2020-06-06: 20 mg via INTRAVENOUS

## 2020-06-06 MED ORDER — PROPOFOL 1000 MG/100ML IV EMUL
INTRAVENOUS | Status: AC
  Filled 2020-06-06: qty 100

## 2020-06-06 MED ORDER — LORAZEPAM 2 MG/ML IJ SOLN
INTRAMUSCULAR | Status: AC
  Filled 2020-06-06: qty 1

## 2020-06-06 MED ORDER — SUCCINYLCHOLINE CHLORIDE 20 MG/ML IJ SOLN
INTRAMUSCULAR | Status: AC | PRN
Start: 2020-06-06 — End: 2020-06-06
  Administered 2020-06-06: 150 mg via INTRAVENOUS

## 2020-06-06 MED ORDER — DOCUSATE SODIUM 50 MG/5ML PO LIQD
100.0000 mg | Freq: Two times a day (BID) | ORAL | Status: DC | PRN
  Filled 2020-06-06: qty 10

## 2020-06-06 MED ORDER — SODIUM CHLORIDE 0.9% FLUSH
3.0000 mL | Freq: Once | INTRAVENOUS | Status: AC
  Administered 2020-06-06: 3 mL via INTRAVENOUS

## 2020-06-06 MED ORDER — SODIUM CHLORIDE 0.9 % IV SOLN: INTRAVENOUS | Status: DC

## 2020-06-06 MED ORDER — ONDANSETRON HCL 4 MG/2ML IJ SOLN
4.0000 mg | Freq: Four times a day (QID) | INTRAMUSCULAR | Status: DC | PRN
  Administered 2020-06-07: 4 mg via INTRAVENOUS
  Filled 2020-06-06: qty 2

## 2020-06-06 MED ORDER — POLYETHYLENE GLYCOL 3350 17 G PO PACK: 17.0000 g | PACK | Freq: Every day | ORAL | Status: DC | PRN

## 2020-06-06 MED ORDER — DOCUSATE SODIUM 50 MG/5ML PO LIQD
100.0000 mg | Freq: Two times a day (BID) | ORAL | Status: DC
  Administered 2020-06-07: 100 mg
  Filled 2020-06-06 (×2): qty 10
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
## 2020-06-06 NOTE — ED Notes (Signed)
Returned from CT.

## 2020-06-06 NOTE — ED Notes (Signed)
Neuro, critical care provider at bedside.

## 2020-06-06 NOTE — ED Notes (Signed)
Patient transported to MRI 

## 2020-06-06 NOTE — Progress Notes (Signed)
RT transported pt to and from MRI without event. 

## 2020-06-06 NOTE — ED Triage Notes (Incomplete)
Pt via EMS, found unresponsive at the bottom of stairs by his friends after telling him he would go outside and smoke. 4mg  narcan given by FD and 1mg  given by EMS. Pt arrives unresponsive, being bagged and posturing.

## 2020-06-06 NOTE — Consult Note (Addendum)
Neurology Consultation Reason for Consult: Altered mental status Requesting Physician: Marianna Fuss  CC: Unresponsiveness  History is obtained from: Chart review and family and EDP  HPI: Charles [name] is a 50 y.o. male with a past medical history significant for sleep apnea, aplastic anemia with chronic thrombocytopenia, hypertension, crack cocaine and marijuana abuse.  His family reports that they were talking to him throughout today and he was in his normal state of health without any acute complaints.  They last talked him at about 4 PM.  At 5 PM he was found unresponsive.  Per daughter Alcario Drought (spelling?) Valentina Lucks [phone]), she is not sure of the circumstances of him being found down as he had been moved by the time she saw him.  Per verbal report from nursing it is possible he was found at the bottom of the stairs, per ED triage notes he had told friends that he was going outside to smoke.  EMS administered Narcan without improvement in his responsiveness and noted him to be quite hypertensive, extensor posturing, and inserted nasopharyngeal airways for airway protection.  Family does report that the patient has been out of his antihypertensive medications as he ran out and has not yet refilled them  On arrival to the ED he was intubated with etomidate and succinylcholine.  He received Ativan and Versed for the posturing movements, and his pupils were noted to go from widely dilated to 2 mm and reactive after benzodiazepine administration.  At this time neurology was consulted for code stroke for acute unresponsiveness.  Head CT did not reveal any acute process.  CTA was obtained to clear basilar circulation given limited history and limited examination secondary to paralytics on board.  This exam was negative for large vessel occlusion.  Due to continued intermittent bilateral hip flexion that was impeding ability to obtain good scans, Ativan 4 mg was administered and he was  additionally started on Keppra with an initial 2 g load  He was admitted 06/2019 for multifocal pneumonia, found to be COVID-19 positive, as well as positive for cocaine and THC.  In 07/2016 he was admitted for altered mental status with hypotension found to have a splenic laceration with active extravasation for which he was treated with embolization.  U tox at the time was positive for cocaine as well  LKW: 4 PM tPA given?: No, nonlocalizing examination; clear basilar artery, history of thrombocytopenia and platelets clumped on today's evaluation  ROS: Unable to obtain due to altered mental status.   Past Medical History:  Diagnosis Date  . Aplastic anemia (HCC)   . Arthritis    "left ankle" (10/17/2014)  . Bilateral pneumonia 10/17/2014  . Hepatitis    "think it was B" (10/17/2014)  . History of blood transfusion "several"   "related to aplastic anemia"  . Hypertension   . Sleep apnea    "wore mask in prison; 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" (10/17/2014)    No family history on file. Unable to obtain due to altered mental status  Social History:  reports that he has been smoking. He has a 7.50 pack-year smoking history. He has quit using smokeless tobacco. He reports current drug use. Drugs: Cocaine and Marijuana. He reports that he does not drink alcohol.  Exam: Current vital signs: BP (!) 144/93   Pulse 81   Temp 98.4 F (36.9 C) (Oral)   Resp 16   SpO2 100%  Vital signs in last 24 hours: Temp:  [98.4 F (36.9 C)] 98.4 F (  36.9 C) (01/25 1945) Pulse Rate:  [77-103] 81 (01/25 2000) Resp:  [11-18] 16 (01/25 2000) BP: (119-149)/(72-102) 144/93 (01/25 2000) SpO2:  [98 %-100 %] 100 % (01/25 2000) FiO2 (%):  [100 %] 100 % (01/25 2000)   Physical Exam  Constitutional: Appears well-developed and well-nourished.  Psych: Noninteractive Eyes: Some injection of the left eye  HENT: ETT in place, some bloody discharge from his nose attributed to nasopharyngeal airway MSK: no joint deformities.   Cardiovascular: Sinus tachycardia Respiratory: Breathing over the ventilator set rate GI: Soft.  No distension. There is no clear tenderness.  Skin: Chronic skin changes bilateral lower extermities, right more than left  Neuro --exam extremely clouded by paralytics on board Mental Status: No eye-opening to noxious stimulation. Cranial Nerves: II: No blink to threat.  Pupils 2.5 to 2 mm and reactive bilaterally, round III,IV, VI: Incomplete sluggish vestibular ocular reflex to gentle head movement V/VII: Corneals absent bilaterally VIII: No response to voice or other sounds Sensory/motor: Tone is low throughout. Bulk is normal.  No movement in any of his extremities other than intermittent lifting of his bilateral legs typically associated with coughing on the ventilator.  Occasionally slight extensor posturing of his bilateral upper extremities.  Towards the end of my examination there is some slight fasciculations in the right more than left calf Deep Tendon Reflexes: Absent in the bilateral patella  Plantars: Toes are mute bilaterally Cerebellar: Unable to assess secondary to patient's mental status   NIHSS total not interpretable in the setting of paralytics  I have reviewed labs in epic and the results pertinent to this consultation are: Creatinine of 1.5 increased from baseline of 0.86 Glucose 205 Troponin 18 Normal CBC other than macrocytosis and elevated RDW; chronic thrombocytopenia of 19 to 90s, clamped on today's evaluation  I have personally reviewed the images obtained: Head CT without any acute intracranial abnormality CTA without any large vessel occlusion CT cervical spine without acute bony process  Impression: This is a 50 year old gentleman with a past medical history significant for substance abuse (marijuana and crack cocaine), hypertension, aplastic anemia, thrombocytopenia, sleep apnea, presenting with acutely altered mental status.  Unfortunately both  history and examination are very limited as detailed above.  Differential for his acute obtundation remains broad.  At this time I do not have high concern for infectious process given no leukocytosis, no fever, and no symptoms prior to 4 PM.  Highest index of suspicion is for a toxic/metabolic process due to substance abuse.  PRES is on the differential given his hypertension and substance use history; does not appear to have RCVS based on vessel imaging.  However will obtain MRI brain imaging to rule out acute stroke contributing given his very limited examination.  We will additionally obtain EEG to evaluate for ongoing seizure activity  Recommendations: -MRI brain without -EEG, long-term with video monitoring; further AED recs pending EEG results  -Agree with UA, U tox, blood alcohol level, CBC, CMP; additional toxic/metabolic work-up per primary team -Appreciate CCM management of ventilator and other medical comorbidities  Brooke Dare MD-PhD [facility] [phone] Please contact Dr. Otelia Limes overnight as he is neurology on-call; patient has been signed out to him  75 minutes were spent in the critical care of this patient including discussion with family, ED providers, and critical care medicine team.
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
## 2020-06-06 NOTE — ED Notes (Signed)
Warm blankets applied to patient at this time
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
## 2020-06-06 NOTE — Progress Notes (Signed)
STAT vLTM EEG started. Wrapped head. Notified neuro

## 2020-06-07 ENCOUNTER — Encounter (HOSPITAL_COMMUNITY): Payer: Self-pay | Admitting: Pulmonary Disease

## 2020-06-07 ENCOUNTER — Inpatient Hospital Stay (HOSPITAL_COMMUNITY): Payer: Medicare HMO

## 2020-06-07 DIAGNOSIS — J9601 Acute respiratory failure with hypoxia: Secondary | ICD-10-CM | POA: Diagnosis not present

## 2020-06-07 DIAGNOSIS — R569 Unspecified convulsions: Secondary | ICD-10-CM | POA: Diagnosis not present

## 2020-06-07 DIAGNOSIS — R0603 Acute respiratory distress: Secondary | ICD-10-CM

## 2020-06-07 LAB — GLUCOSE, CAPILLARY
Glucose-Capillary: 87 mg/dL (ref 70–99)
Glucose-Capillary: 93 mg/dL (ref 70–99)
Glucose-Capillary: 93 mg/dL (ref 70–99)
Glucose-Capillary: 94 mg/dL (ref 70–99)
Glucose-Capillary: 96 mg/dL (ref 70–99)

## 2020-06-07 LAB — CBC
HCT: 37.7 % — ABNORMAL LOW (ref 39.0–52.0)
Hemoglobin: 12.5 g/dL — ABNORMAL LOW (ref 13.0–17.0)
MCH: 34.2 pg — ABNORMAL HIGH (ref 26.0–34.0)
MCHC: 33.2 g/dL (ref 30.0–36.0)
MCV: 103 fL — ABNORMAL HIGH (ref 80.0–100.0)
Platelets: 76 10*3/uL — ABNORMAL LOW (ref 150–400)
RBC: 3.66 MIL/uL — ABNORMAL LOW (ref 4.22–5.81)
RDW: 15.7 % — ABNORMAL HIGH (ref 11.5–15.5)
WBC: 5.6 10*3/uL (ref 4.0–10.5)
nRBC: 0 % (ref 0.0–0.2)

## 2020-06-07 LAB — BASIC METABOLIC PANEL
Anion gap: 9 (ref 5–15)
BUN: 6 mg/dL (ref 6–20)
CO2: 25 mmol/L (ref 22–32)
Calcium: 8.5 mg/dL — ABNORMAL LOW (ref 8.9–10.3)
Chloride: 109 mmol/L (ref 98–111)
Creatinine, Ser: 1.23 mg/dL (ref 0.61–1.24)
GFR, Estimated: >60 mL/min (ref >60)
Glucose, Bld: 100 mg/dL — ABNORMAL HIGH (ref 70–99)
Potassium: 3.9 mmol/L (ref 3.5–5.1)
Sodium: 143 mmol/L (ref 135–145)

## 2020-06-07 LAB — MRSA PCR SCREENING: MRSA by PCR: NEGATIVE

## 2020-06-07 LAB — ECHOCARDIOGRAM COMPLETE
AR max vel: 3.1 cm2
AV Area VTI: 3.17 cm2
AV Area mean vel: 2.87 cm2
AV Mean grad: 5 mmHg
AV Peak grad: 8 mmHg
Ao pk vel: 1.41 m/s
Area-P 1/2: 3.53 cm2
Height: 66 in
S' Lateral: 2.8 cm
Weight: 2800.72 oz

## 2020-06-07 LAB — POCT I-STAT 7, (LYTES, BLD GAS, ICA,H+H)
Acid-Base Excess: 1 mmol/L (ref 0.0–2.0)
Bicarbonate: 26.7 mmol/L (ref 20.0–28.0)
Calcium, Ion: 1.25 mmol/L (ref 1.15–1.40)
HCT: 34 % — ABNORMAL LOW (ref 39.0–52.0)
Hemoglobin: 11.6 g/dL — ABNORMAL LOW (ref 13.0–17.0)
O2 Saturation: 99 %
Patient temperature: 35.1
Potassium: 3.6 mmol/L (ref 3.5–5.1)
Sodium: 145 mmol/L (ref 135–145)
TCO2: 28 mmol/L (ref 22–32)
pCO2 arterial: 44.4 mmHg (ref 32.0–48.0)
pH, Arterial: 7.377 (ref 7.350–7.450)
pO2, Arterial: 120 mmHg — ABNORMAL HIGH (ref 83.0–108.0)

## 2020-06-07 LAB — PHOSPHORUS: Phosphorus: 3.7 mg/dL (ref 2.5–4.6)

## 2020-06-07 LAB — TROPONIN I (HIGH SENSITIVITY)
Troponin I (High Sensitivity): 131 ng/L (ref <18)
Troponin I (High Sensitivity): 129 ng/L (ref <18)

## 2020-06-07 LAB — SARS CORONAVIRUS 2 (TAT 6-24 HRS): SARS Coronavirus 2: NEGATIVE

## 2020-06-07 LAB — MAGNESIUM: Magnesium: 2 mg/dL (ref 1.7–2.4)

## 2020-06-07 MED ORDER — HEPARIN (PORCINE) 25000 UT/250ML-% IV SOLN
1000.0000 [IU]/h | INTRAVENOUS | Status: DC
  Administered 2020-06-07: 1000 [IU]/h via INTRAVENOUS
  Filled 2020-06-07: qty 250

## 2020-06-07 MED ORDER — HALOPERIDOL LACTATE 5 MG/ML IJ SOLN: 5.0000 mg | Freq: Four times a day (QID) | INTRAMUSCULAR | Status: DC | PRN

## 2020-06-07 MED ORDER — ALBUTEROL SULFATE (2.5 MG/3ML) 0.083% IN NEBU
2.5000 mg | INHALATION_SOLUTION | Freq: Two times a day (BID) | RESPIRATORY_TRACT | Status: DC
  Administered 2020-06-07: 2.5 mg via RESPIRATORY_TRACT
  Filled 2020-06-07: qty 3

## 2020-06-07 MED ORDER — SODIUM CHLORIDE 0.9 % IV SOLN
2.0000 g | INTRAVENOUS | Status: DC
  Administered 2020-06-07 – 2020-06-08 (×2): 2 g via INTRAVENOUS
  Filled 2020-06-07 (×2): qty 20

## 2020-06-07 MED ORDER — CHLORHEXIDINE GLUCONATE CLOTH 2 % EX PADS: 6.0000 | MEDICATED_PAD | Freq: Every day | CUTANEOUS | Status: DC

## 2020-06-07 MED ORDER — HEPARIN BOLUS VIA INFUSION
3000.0000 [IU] | Freq: Once | INTRAVENOUS | Status: AC
  Administered 2020-06-07: 3000 [IU] via INTRAVENOUS
  Filled 2020-06-07: qty 3000

## 2020-06-07 MED ORDER — MIDAZOLAM HCL 2 MG/2ML IJ SOLN
1.0000 mg | INTRAMUSCULAR | Status: DC | PRN
  Administered 2020-06-07 (×2): 1 mg via INTRAVENOUS
  Filled 2020-06-07 (×3): qty 2

## 2020-06-07 MED ORDER — NOREPINEPHRINE 4 MG/250ML-% IV SOLN
INTRAVENOUS | Status: AC
  Filled 2020-06-07: qty 250

## 2020-06-07 MED ORDER — HEPARIN SODIUM (PORCINE) 5000 UNIT/ML IJ SOLN: 5000.0000 [IU] | Freq: Three times a day (TID) | INTRAMUSCULAR | Status: DC

## 2020-06-07 MED ORDER — ORAL CARE MOUTH RINSE: 15.0000 mL | Freq: Two times a day (BID) | OROMUCOSAL | Status: DC

## 2020-06-07 MED ORDER — AMLODIPINE BESYLATE 10 MG PO TABS
10.0000 mg | ORAL_TABLET | Freq: Every day | ORAL | Status: DC
  Filled 2020-06-07: qty 1

## 2020-06-07 NOTE — Progress Notes (Signed)
Patient assessed for extubation. Following commands, normal strength, normal respiratory effort.  Will proceed with extubation. Reviewed with MD.   Canary Brim, MSN, NP-C, AGACNP-BC [facility] 06/07/2020, 12:49 PM   Please see [email or web address] for pager details.

## 2020-06-07 NOTE — Procedures (Addendum)
Patient Name: Charles [name]  MRN: [id number]  Epilepsy Attending: Charlsie Quest  Referring Physician/Provider: Dr. Caryl Pina Duration: 06/06/2020 2250 to 06/07/2020 1611  Patient history: 50 year old male with history of substance abuse presented with altered mental status, seizure-like activity.  EEG to evaluate for seizures.  Level of alertness: Asleep/sedated  AEDs during EEG study: None  Technical aspects: This EEG study was done with scalp electrodes positioned according to the 10-20 International system of electrode placement. Electrical activity was acquired at a sampling rate of 500Hz  and reviewed with a high frequency filter of 70Hz  and a low frequency filter of 1Hz . EEG data were recorded continuously and digitally stored.   Description: No clear posterior dominant rhythm seen.  Sleep was characterized by sleep spindles (12 to 14 Hz), maximal frontocentral region.  EEG showed continuous generalized 2 to 3 Hz delta slowing with overriding 15 to 18 Hz beta activity. Hyperventilation and photic stimulation were not performed.     ABNORMALITY -Continuous slow, generalized  IMPRESSION: This study is suggestive of moderate to severe diffuse encephalopathy, nonspecific etiology but could be secondary to sedation.  No seizures or epileptiform discharges were seen throughout the recording.  [name] 

## 2020-06-07 NOTE — Progress Notes (Signed)
Neurology Progress Note  S: Pt was extubated today and has been off sedation for hours.   O: Current vital signs: BP (!) 131/96 (BP Location: Left Arm)   Pulse 93   Temp (!) 101.48 F (38.6 C) (Bladder)   Resp 11   Ht 5\' 6"  (1.676 m)   Wt 79.4 kg   SpO2 92%   BMI 28.25 kg/m  Vital signs in last 24 hours: Temp:  [94.5 F (34.7 C)-102.02 F (38.9 C)] 101.48 F (38.6 C) (01/26 1600) Pulse Rate:  [53-103] 93 (01/26 1600) Resp:  [9-20] 11 (01/26 1600) BP: (105-156)/(69-105) 131/96 (01/26 1600) SpO2:  [92 %-100 %] 92 % (01/26 1600) FiO2 (%):  [32 %-100 %] 32 % (01/26 1336) Weight:  [68 kg-79.4 kg] 79.4 kg (01/26 0200)  GENERAL: Awake, alert in NAD. Ill appearing.  HEENT: Normocephalic and atraumatic. Bleeding into OD.  LUNGS: Normal respiratory effort.  CV: RRR on tele.  ABDOMEN: Soft, nontender Ext: warm  NEURO:  Mental Status: Awake, sleepy, but oriented x3  Speech/Language: speech is without dysarthria.  No evidence of aphasia. Naming, repetition, fluency, and comprehension intact. Cranial Nerves:  II: PERRL. Visual fields full.  III, IV, VI: EOMI. Eyelids elevate symmetrically.  V: Sensation is intact to light touch and symmetrical to face.  VII: Smile is symmetrical.  VIII: hearing intact to voice. IX, X: Palate elevates symmetrically. Phonation is normal.  01-07-1980 shrug 5/5. XII: tongue is midline without fasciculations. Motor: 5/5 strength to all muscle groups tested.  Tone: is normal and bulk is increased to trunk Sensation- Intact to light touch bilaterally. Extinction intact.   Coordination: FTN intact bilaterally. No drift.  Gait- bedrest  Medications  Current Facility-Administered Medications:  .  0.9 %  sodium chloride infusion, , Intravenous, Continuous, WJ:[id number], MD, Last Rate: 75 mL/hr at 06/07/20 1600, Infusion Verify at 06/07/20 1600 .  acetaminophen (TYLENOL) tablet 650 mg, 650 mg, Oral, Q4H PRN, 06/09/20, MD .  albuterol  (PROVENTIL) (2.5 MG/3ML) 0.083% nebulizer solution 2.5 mg, 2.5 mg, Nebulization, Q2H PRN, Marcelle Smiling, MD .  albuterol (PROVENTIL) (2.5 MG/3ML) 0.083% nebulizer solution 2.5 mg, 2.5 mg, Nebulization, BID, Marcelle Smiling, MD .  amLODipine (NORVASC) tablet 10 mg, 10 mg, Per Tube, Daily, Ollis, Brandi L, NP .  cefTRIAXone (ROCEPHIN) 2 g in sodium chloride 0.9 % 100 mL IVPB, 2 g, Intravenous, Q24H, Ollis, Brandi L, NP, Last Rate: 200 mL/hr at 06/07/20 1235, 2 g at 06/07/20 1235 .  Chlorhexidine Gluconate Cloth 2 % PADS 6 each, 6 each, Topical, Daily, Icard, Bradley L, DO .  dexmedetomidine (PRECEDEX) 400 MCG/100ML (4 mcg/mL) infusion, 0-1.2 mcg/kg/hr, Intravenous, Continuous, 06/09/20, MD, Stopped at 06/07/20 0226 .  docusate (COLACE) 50 MG/5ML liquid 100 mg, 100 mg, Per Tube, BID PRN, 06/09/20, MD .  docusate (COLACE) 50 MG/5ML liquid 100 mg, 100 mg, Per Tube, BID, Marcelle Smiling, MD, 100 mg at 06/07/20 1100 .  MEDLINE mouth rinse, 15 mL, Mouth Rinse, BID, Icard, Bradley L, DO .  midazolam (VERSED) injection 1 mg, 1 mg, Intravenous, Q2H PRN, 06/09/20, MD, 1 mg at 06/07/20 0542 .  ondansetron (ZOFRAN) injection 4 mg, 4 mg, Intravenous, Q6H PRN, 06/09/20, MD, 4 mg at 06/07/20 1457 .  pantoprazole (PROTONIX) injection 40 mg, 40 mg, Intravenous, QHS, 06/09/20, MD, 40 mg at 06/07/20 0034 .  polyethylene glycol (MIRALAX / GLYCOLAX) packet 17 g, 17 g, Per Tube, Daily PRN, 06/09/20, MD .  polyethylene glycol (MIRALAX /  GLYCOLAX) packet 17 g, 17 g, Per Tube, Daily, Marcelle Smiling, MD, 17 g at 06/07/20 1100   Imaging 06/06/20 CTA Head and Neck showed  1. Negative CTA for emergent large vessel occlusion. 2. Short-segment moderate stenosis of the distal right V2 segment related to extrinsic compression from osteophytic spurring at the level of C2-3. Otherwise wide patency of the posterior circulation. 3. No other hemodynamically significant or  correctable stenosis about the major arterial vasculature of the head and neck. Mild atheromatous change about the carotid bifurcations without stenosis.  06/06/20 MRI Brain showed  1. No acute intracranial abnormality. 2. Single focus of susceptibility artifact with underlying small focus of encephalomalacia involving the right frontal cortex. Finding is of uncertain etiology, but could be related to remote trauma. No acute intracranial hemorrhage seen within this region on prior noncontrast head CT. 3. Otherwise normal brain MRI.  1/25-1/26/22 EEG. Continuous slow, generalized. This study is suggestive of moderate to severe diffuse encephalopathy, nonspecific etiology but could be secondary to sedation.  No seizures or epileptiform discharges were seen throughout the recording.  Assessment: 50 yo male with a hx of crack cocaine abuse, positive for cocaine and benzodiazepines on UDS 06/06/20 who presented to [facility] via EMS with c/o AMS and agonal breathing. He was intubated and sedated. Code stroke was callled without acute findings. No LVO. Differentials included toxic/metabolic process due to substance abuse and PRES which did not show on imaging. Imaging and EEG showed no stroke and EEG was negative for epileptiform changes. He is examined off sedation today. His brain stem is intact and he has evidence of higher cortical function.   Impression: 1. AMS secondary to toxic metabolic process and substance use/abuse, improved off sedation.   Recommendations: -No further neurological workup is needed unless patient should deteriorate.  -Neurology will be available for questions as needed.   Pt seen by Jimmye Norman, MSN, APN-BC/Nurse Practitioner/Neuro and plan was discussed and approved by MD.   Pager: [phone]

## 2020-06-07 NOTE — Procedures (Signed)
Extubation Procedure Note  Patient Details:   Name: Charles [name] DOB: March 18, 1971 MRN: [id number]   Airway Documentation:    Vent end date: 06/07/20 Vent end time: 1257   Evaluation  O2 sats: stable throughout Complications: No apparent complications Patient did tolerate procedure well. Bilateral Breath Sounds: Diminished   Yes, pt could speak post extubation.  Pt extubated to 3 l/m Wallace.   Audrie Lia 06/07/2020, 12:58 PM

## 2020-06-07 NOTE — Progress Notes (Addendum)
eLink Physician-Brief Progress Note Patient Name: Charles [name] DOB: Nov 20, 1970 MRN: [id number]   Date of Service  06/07/2020  HPI/Events of Note  7 M history of aplastic anemia, brought in due to altered sensorium. Head CT negative for acute pathology. Had seizure like activity. Intubated in the ED for airway protection. Tox screen positive for cocaine and benzodiazepines.  eICU Interventions  Neurology consulted. For EEG. Transition propofol to precedex so as not to affect EEG result.  Trop 96, ST elevation with prolonged QT. Hold Precedex for now. Start heparin drip. Trend troponin. Versed only if necessary. Discussed with bedside RN     Intervention Category Major Interventions: Change in mental status - evaluation and management Evaluation Type: New Patient Evaluation  [name] 06/07/2020, 1:34 AM

## 2020-06-07 NOTE — Progress Notes (Signed)
ANTICOAGULATION CONSULT NOTE - Initial Consult  Pharmacy Consult for heparin Indication: chest pain/ACS  Allergies  Allergen Reactions  . Eggs Or Egg-Derived Products Nausea And Vomiting and Swelling  . Banana Nausea And Vomiting  . Pork-Derived Products Nausea And Vomiting    Patient Measurements: Height: 5\' 6"  (167.6 cm) Weight: 79.4 kg (175 lb 0.7 oz) IBW/kg (Calculated) : 63.8 Heparin Dosing Weight: 70kg  Vital Signs: Temp: 95 F (35 C) (01/26 0200) Temp Source: Bladder (01/26 0200) BP: 119/80 (01/26 0200) Pulse Rate: 53 (01/26 0200)  Labs: Recent Labs    06/06/20 1820 06/06/20 1839 06/06/20 1947 06/06/20 2009 06/06/20 2243  HGB 13.9 15.0 10.9*  --  11.6*  HCT 44.0 44.0 32.0*  --  34.0*  PLT PLATELET CLUMPS NOTED ON SMEAR, UNABLE TO ESTIMATE  --   --   --   --   APTT 26  --   --   --   --   LABPROT 13.0  --   --   --   --   INR 1.0  --   --   --   --   CREATININE 1.53* 1.40*  --  1.32*  --   TROPONINIHS 18*  --   --  96*  --     Estimated Creatinine Clearance: 67 mL/min (A) (by C-G formula based on SCr of 1.32 mg/dL (H)).   Medical History: Past Medical History:  Diagnosis Date  . Aplastic anemia (HCC)   . Arthritis    "left ankle" (10/17/2014)  . Bilateral pneumonia 10/17/2014  . Hepatitis    "think it was B" (10/17/2014)  . History of blood transfusion "several"   "related to aplastic anemia"  . Hypertension   . Sleep apnea    "wore mask in prison; 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" (10/17/2014)    Medications:  Medications Prior to Admission  Medication Sig Dispense Refill Last Dose  . acetaminophen (TYLENOL) 500 MG tablet Take 1,000 mg by mouth every 6 (six) hours as needed for mild pain.   Past Month at Unknown time  . amLODipine (NORVASC) 10 MG tablet Take 10 mg by mouth daily.   Past Month at Unknown time  . diphenhydrAMINE (BENADRYL) 25 MG tablet Take 25 mg by mouth every 6 (six) hours as needed for allergies.   unk  . fluticasone (FLONASE) 50 MCG/ACT nasal  spray Place 2 sprays into both nostrils daily as needed for allergies.   unk  . naproxen (NAPROSYN) 500 MG tablet Take 500 mg by mouth 2 (two) times daily as needed for mild pain.   unk  . valsartan-hydrochlorothiazide (DIOVAN-HCT) 80-12.5 MG tablet Take 1 tablet by mouth daily.   Past Month at Unknown time  . penicillin v potassium (VEETID) 500 MG tablet Take 500 mg by mouth 3 (three) times daily. (Patient not taking: Reported on 06/06/2020)   Not Taking at Unknown time   Scheduled:  . albuterol  2.5 mg Nebulization Q6H  . docusate  100 mg Per Tube BID  . pantoprazole (PROTONIX) IV  40 mg Intravenous QHS  . polyethylene glycol  17 g Per Tube Daily   Infusions:  . sodium chloride 75 mL/hr at 06/07/20 0231  . dexmedetomidine (PRECEDEX) IV infusion 0.4 mcg/kg/hr (06/07/20 0200)  . propofol      Assessment: 50yo male presented to ED as unresponsive >> started seizing en route with EMS, concern for possible OD, troponin found to be elevated, to begin heparin.  Goal of Therapy:  Heparin level  0.3-0.7 units/ml Monitor platelets by anticoagulation protocol: Yes   Plan:  Will give heparin 3000 units IV bolus x1 followed by gtt at 1000 units/hr and monitor heparin levels and CBC.  Vernard Gambles, PharmD, BCPS  06/07/2020,2:36 AM

## 2020-06-07 NOTE — Progress Notes (Signed)
LTM maint complete - no skin breakdown under: FP1,A1
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
## 2020-06-07 NOTE — Progress Notes (Signed)
  Echocardiogram 2D Echocardiogram has been performed.  Gerda Diss 06/07/2020, 5:50 PM

## 2020-06-08 ENCOUNTER — Inpatient Hospital Stay (HOSPITAL_COMMUNITY): Payer: Medicare HMO

## 2020-06-08 ENCOUNTER — Other Ambulatory Visit: Payer: Self-pay

## 2020-06-08 ENCOUNTER — Encounter (HOSPITAL_COMMUNITY): Payer: Self-pay | Admitting: Pulmonary Disease

## 2020-06-08 LAB — BASIC METABOLIC PANEL
Anion gap: 8 (ref 5–15)
BUN: 6 mg/dL (ref 6–20)
CO2: 27 mmol/L (ref 22–32)
Calcium: 8.9 mg/dL (ref 8.9–10.3)
Chloride: 106 mmol/L (ref 98–111)
Creatinine, Ser: 1.19 mg/dL (ref 0.61–1.24)
GFR, Estimated: >60 mL/min (ref >60)
Glucose, Bld: 99 mg/dL (ref 70–99)
Potassium: 4.2 mmol/L (ref 3.5–5.1)
Sodium: 141 mmol/L (ref 135–145)

## 2020-06-08 LAB — CBC
HCT: 36.1 % — ABNORMAL LOW (ref 39.0–52.0)
Hemoglobin: 12.2 g/dL — ABNORMAL LOW (ref 13.0–17.0)
MCH: 34.4 pg — ABNORMAL HIGH (ref 26.0–34.0)
MCHC: 33.8 g/dL (ref 30.0–36.0)
MCV: 101.7 fL — ABNORMAL HIGH (ref 80.0–100.0)
Platelets: 69 10*3/uL — ABNORMAL LOW (ref 150–400)
RBC: 3.55 MIL/uL — ABNORMAL LOW (ref 4.22–5.81)
RDW: 15.1 % (ref 11.5–15.5)
WBC: 5.9 10*3/uL (ref 4.0–10.5)
nRBC: 0 % (ref 0.0–0.2)

## 2020-06-08 LAB — GLUCOSE, CAPILLARY
Glucose-Capillary: 95 mg/dL (ref 70–99)
Glucose-Capillary: 96 mg/dL (ref 70–99)

## 2020-06-08 MED ORDER — VALSARTAN-HYDROCHLOROTHIAZIDE 80-12.5 MG PO TABS: 1.0000 | ORAL_TABLET | Freq: Every day | ORAL | 0 refills | Status: DC

## 2020-06-08 MED ORDER — AMLODIPINE BESYLATE 10 MG PO TABS: 10.0000 mg | ORAL_TABLET | Freq: Every day | ORAL | 0 refills | Status: DC

## 2020-06-08 MED ORDER — OXYCODONE-ACETAMINOPHEN 5-325 MG PO TABS
1.0000 | ORAL_TABLET | Freq: Four times a day (QID) | ORAL | Status: DC | PRN
  Administered 2020-06-08: 1 via ORAL
  Filled 2020-06-08: qty 1

## 2020-06-08 MED ORDER — DOCUSATE SODIUM 100 MG PO CAPS: 100.0000 mg | ORAL_CAPSULE | Freq: Two times a day (BID) | ORAL | Status: DC

## 2020-06-08 MED ORDER — VALSARTAN 80 MG PO TABS
80.0000 mg | ORAL_TABLET | Freq: Every day | ORAL | Status: DC
  Filled 2020-06-08: qty 1

## 2020-06-08 MED ORDER — HYDROCHLOROTHIAZIDE 12.5 MG PO CAPS
12.5000 mg | ORAL_CAPSULE | Freq: Every day | ORAL | Status: DC
  Filled 2020-06-08: qty 1

## 2020-06-08 MED ORDER — VALSARTAN-HYDROCHLOROTHIAZIDE 80-12.5 MG PO TABS: 1.0000 | ORAL_TABLET | Freq: Every day | ORAL | Status: DC

## 2020-06-08 MED ORDER — HYDRALAZINE HCL 20 MG/ML IJ SOLN
10.0000 mg | INTRAMUSCULAR | Status: DC | PRN
  Administered 2020-06-08: 20 mg via INTRAVENOUS
  Filled 2020-06-08: qty 1

## 2020-06-08 MED ORDER — AMLODIPINE BESYLATE 10 MG PO TABS
10.0000 mg | ORAL_TABLET | Freq: Every day | ORAL | Status: DC
Start: 2020-06-09 — End: 2020-06-08

## 2020-06-08 MED ORDER — POLYETHYLENE GLYCOL 3350 17 G PO PACK: 17.0000 g | PACK | Freq: Every day | ORAL | Status: DC | PRN

## 2020-06-08 NOTE — Progress Notes (Signed)
eLink Physician-Brief Progress Note Patient Name: Charles [name] DOB: 1970/10/29 MRN: [id number]   Date of Service  06/08/2020  HPI/Events of Note  Patient with hypertension. Heart rate 61-66.  eICU Interventions  PRN Hydralazine ordered to keep SBP < 160 mmHg.        Charles [name] 06/08/2020, 6:19 AM
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
## 2020-06-08 NOTE — Progress Notes (Signed)
vLTM EEG complete. Pt d/c own leads yesterday. No need to re hook per neuro

## 2020-06-08 NOTE — Progress Notes (Signed)
Patient DC'd home via car with niece.  DC instructions and prescriptions information given to patient and both fully understood.  Vital signs and assessments were stable prior to discharge.
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
## 2020-06-08 NOTE — Progress Notes (Signed)
CPAP on standby at bedside d/t patient being in mittens. SpO2 on 2LNC 97%. No distress noted.

## 2020-06-08 NOTE — Discharge Instructions (Signed)
Hypertension, Adult Hypertension is another name for high blood pressure. High blood pressure forces your heart to work harder to pump blood. This can cause problems over time. There are two numbers in a blood pressure reading. There is a top number (systolic) over a bottom number (diastolic). It is best to have a blood pressure that is below 120/80. Healthy choices can help lower your blood pressure, or you may need medicine to help lower it. What are the causes? The cause of this condition is not known. Some conditions may be related to high blood pressure. What increases the risk?  Smoking.  Having type 2 diabetes mellitus, high cholesterol, or both.  Not getting enough exercise or physical activity.  Being overweight.  Having too much fat, sugar, calories, or salt (sodium) in your diet.  Drinking too much alcohol.  Having long-term (chronic) kidney disease.  Having a family history of high blood pressure.  Age. Risk increases with age.  Race. You may be at higher risk if you are African American.  Gender. Men are at higher risk than women before age 88. After age 63, women are at higher risk than men.  Having obstructive sleep apnea.  Stress. What are the signs or symptoms?  High blood pressure may not cause symptoms. Very high blood pressure (hypertensive crisis) may cause: ? Headache. ? Feelings of worry or nervousness (anxiety). ? Shortness of breath. ? Nosebleed. ? A feeling of being sick to your stomach (nausea). ? Throwing up (vomiting). ? Changes in how you see. ? Very bad chest pain. ? Seizures. How is this treated?  This condition is treated by making healthy lifestyle changes, such as: ? Eating healthy foods. ? Exercising more. ? Drinking less alcohol.  Your health care provider may prescribe medicine if lifestyle changes are not enough to get your blood pressure under control, and if: ? Your top number is above 130. ? Your bottom number is above  80.  Your personal target blood pressure may vary. Follow these instructions at home: Eating and drinking  If told, follow the DASH eating plan. To follow this plan: ? Fill one half of your plate at each meal with fruits and vegetables. ? Fill one fourth of your plate at each meal with whole grains. Whole grains include whole-wheat pasta, brown rice, and whole-grain bread. ? Eat or drink low-fat dairy products, such as skim milk or low-fat yogurt. ? Fill one fourth of your plate at each meal with low-fat (lean) proteins. Low-fat proteins include fish, chicken without skin, eggs, beans, and tofu. ? Avoid fatty meat, cured and processed meat, or chicken with skin. ? Avoid pre-made or processed food.  Eat less than 1,500 mg of salt each day.  Do not drink alcohol if: ? Your doctor tells you not to drink. ? You are pregnant, may be pregnant, or are planning to become pregnant.  If you drink alcohol: ? Limit how much you use to:  0-1 drink a day for women.  0-2 drinks a day for men. ? Be aware of how much alcohol is in your drink. In the [place] one drink equals one 12 oz bottle of beer (355 mL), one 5 oz glass of wine (148 mL), or one 1 oz glass of hard liquor (44 mL).   Lifestyle  Work with your doctor to stay at a healthy weight or to lose weight. Ask your doctor what the best weight is for you.  Get at least 30 minutes of exercise most  days of the week. This may include walking, swimming, or biking.  Get at least 30 minutes of exercise that strengthens your muscles (resistance exercise) at least 3 days a week. This may include lifting weights or doing Pilates.  Do not use any products that contain nicotine or tobacco, such as cigarettes, e-cigarettes, and chewing tobacco. If you need help quitting, ask your doctor.  Check your blood pressure at home as told by your doctor.  Keep all follow-up visits as told by your doctor. This is important.   Medicines  Take over-the-counter  and prescription medicines only as told by your doctor. Follow directions carefully.  Do not skip doses of blood pressure medicine. The medicine does not work as well if you skip doses. Skipping doses also puts you at risk for problems.  Ask your doctor about side effects or reactions to medicines that you should watch for. Contact a doctor if you:  Think you are having a reaction to the medicine you are taking.  Have headaches that keep coming back (recurring).  Feel dizzy.  Have swelling in your ankles.  Have trouble with your vision. Get help right away if you:  Get a very bad headache.  Start to feel mixed up (confused).  Feel weak or numb.  Feel faint.  Have very bad pain in your: ? Chest. ? Belly (abdomen).  Throw up more than once.  Have trouble breathing. Summary  Hypertension is another name for high blood pressure.  High blood pressure forces your heart to work harder to pump blood.  For most people, a normal blood pressure is less than 120/80.  Making healthy choices can help lower blood pressure. If your blood pressure does not get lower with healthy choices, you may need to take medicine. This information is not intended to replace advice given to you by your health care provider. Make sure you discuss any questions you have with your health care provider. Document Revised: 01/07/2018 Document Reviewed: 01/07/2018 Elsevier Patient Education  2021 [employer] Inc[employer] Cocaine Use Disorder Cocaine use disorder is a condition in which your cocaine use disrupts your daily life. Cocaine belongs to a group of powerful drugs known as stimulants. Other names for cocaine include coke, crack, blow, snow, C, powder, and nose candy. This drug has some medical uses, but these are rare. Cocaine is most often misused because of its effects, which include:  A feeling of extreme pleasure (euphoria).  Alertness.  A high energy level. Cocaine use can affect your relationships  and how you do your job. Cocaine use disorder can be dangerous because:  Cocaine increases your blood pressure.  Using cocaine can lead to a heart attack or stroke.  Cocaine use can cause fast or irregular heartbeats and seizures.  Non-medical cocaine use is illegal and can lead to criminal charges. Cocaine use can be life-threatening. What are the causes? This condition is caused by misusing cocaine. Many people start using cocaine because it makes them feel good or helps them deal with their lives. Over time, they get addicted to it. When they try to stop using it, they feel sick. What increases the risk? The following factors may make you more likely to develop this condition:  Misusing other drugs.  A family history of misusing drugs.  History of mental illness. What are the signs or symptoms? Symptoms of this condition include:  Using more cocaine than you want to, or using cocaine for longer than you want to. You may crave cocaine,  try several times to use less cocaine, or try to control your cocaine use. You may also need more and more cocaine to get the same effect that you want (build up a tolerance).  Spending a lot of time getting cocaine, using it, or recovering from its effects.  Having problems at work, at school, at home, or with relationships because of cocaine use.  Giving up or cutting down on important life activities because of cocaine use.  Using cocaine when it is dangerous, such as when driving a car.  Continuing to use cocaine even though it has led to a physical problem, such as poor nutrition, nosebleeds, and chest pain.  Continuing to use cocaine even though it is affecting your mental health. You may develop: ? Schizophrenia-like symptoms. You may think or act in an unusual way. ? Depression. ? Bipolar mood swings. ? Anxiety. ? Sleep problems. If you stop using cocaine, you may have symptoms of withdrawal. Symptoms include depression, sleep  problems, and bad dreams. How is this diagnosed? This condition is diagnosed with an assessment. During the assessment, your health care provider will ask about your cocaine use and about how it affects your life. Your health care provider may also:  Do a physical exam or lab tests to see if you have a physical problem resulting from cocaine use.  Screen for drug use.  Refer you to a mental health professional for evaluation. How is this treated? Treatment for this condition is usually provided by mental health professionals with training in substance use disorders. Treatment may involve:  Counseling. This treatment is also called talk therapy. It is provided by substance use treatment counselors. A counselor can address the reasons you use cocaine and suggest ways to keep you from using it again. The goals of talk therapy are to: ? Find healthy activities to replace using cocaine. ? Identify and avoid what triggers your cocaine use. ? Help you learn how to handle cravings.  Support groups. Support groups are run by people who have quit using addictive substances. They provide emotional support, advice, and guidance.  Medicines.  Care at a residential treatment center.   Follow these instructions at home: Medicines  Take over-the-counter and prescription medicines only as told by your health care provider.  Check with your health care provider before starting any new medicines, herbs, or supplements. General instructions  Do not use any drugs or alcohol.  Avoid people and activities that trigger your use of cocaine.  Learn and practice techniques for managing stress.  Have a plan for vulnerable moments. Get phone numbers of those who are willing to help and who are committed to your recovery.  Attend support groups regularly for emotional support, advice, and guidance.  Do not use any products that contain nicotine or tobacco, such as cigarettes, e-cigarettes, and chewing  tobacco. If you need help quitting, ask your health care provider.  Keep all follow-up visits as told by your health care provider. This is important. This includes work with therapists and support groups.      Where to find more information  [employer] on Drug Abuse: http://www.price-smith.com/  Substance Abuse and Mental Health Services Administration: SkateOasis.com.pt  Narcotics Anonymous: www.na.org Contact a health care provider if:  You cannot take your medicines as told.  You use cocaine again.  Your symptoms get worse. Get help right away if you have:  Serious thoughts about hurting yourself or others.  Chest pain.  Any symptoms of a stroke. "BE  FAST" is an easy way to remember the main warning signs of a stroke: ? B - Balance. Signs are dizziness, sudden trouble walking, or loss of balance. ? E - Eyes. Signs are trouble seeing or a sudden change in vision. ? F - Face. Signs are sudden weakness or numbness of the face, or the face or eyelid drooping on one side. ? A - Arms. Signs are weakness or numbness in an arm. This happens suddenly and usually on one side of the body. ? S - Speech. Signs are sudden trouble speaking, slurred speech, or trouble understanding what people say. ? T - Time. Time to call emergency services. Write down what time symptoms started.  Other signs of a stroke, such as: ? A sudden, severe headache with no known cause. ? Nausea or vomiting. ? Seizure. These symptoms may represent a serious problem that is an emergency. Do not wait to see if the symptoms will go away. Get medical help right away. Call your local emergency services (911 in the [place] Do not drive yourself to the hospital. If you ever feel like you may hurt yourself or others, or have thoughts about taking your own life, get help right away. Go to your nearest emergency department or:  Call your local emergency services (911 in the [place]  Call a suicide crisis helpline, such as the  National Suicide Prevention Lifeline at 215-073-6071. This is open 24 hours a day in the [place]  Text the Crisis Text Line at 3376857811 (in the [place] Summary  Cocaine has some medical uses, but these are rare. Cocaine is most often misused because of its effects.  Many people start using cocaine because it makes them feel good or helps them deal with their lives. Over time, they get addicted to it.  Treatment for this condition is usually provided by mental health professionals with training in substance use disorders. This information is not intended to replace advice given to you by your health care provider. Make sure you discuss any questions you have with your health care provider. Document Revised: 02/24/2019 Document Reviewed: 02/24/2019 Elsevier Patient Education  2021 [employer].

## 2020-06-09 LAB — CULTURE, RESPIRATORY W GRAM STAIN: Culture: NORMAL

## 2021-04-07 ENCOUNTER — Other Ambulatory Visit: Payer: Self-pay

## 2021-04-07 ENCOUNTER — Emergency Department (HOSPITAL_COMMUNITY): Payer: Medicare Other

## 2021-04-07 ENCOUNTER — Emergency Department (HOSPITAL_COMMUNITY)
Admission: EM | Admit: 2021-04-07 | Discharge: 2021-04-07 | Disposition: A | Discharge status: Home / Self Care | Payer: Medicare Other | Attending: Emergency Medicine | Admitting: Emergency Medicine

## 2021-04-07 DIAGNOSIS — Z79899 Other long term (current) drug therapy: Secondary | ICD-10-CM | POA: Insufficient documentation

## 2021-04-07 DIAGNOSIS — I10 Essential (primary) hypertension: Secondary | ICD-10-CM | POA: Diagnosis not present

## 2021-04-07 DIAGNOSIS — Z20822 Contact with and (suspected) exposure to covid-19: Secondary | ICD-10-CM | POA: Insufficient documentation

## 2021-04-07 DIAGNOSIS — R519 Headache, unspecified: Secondary | ICD-10-CM | POA: Diagnosis present

## 2021-04-07 DIAGNOSIS — E871 Hypo-osmolality and hyponatremia: Secondary | ICD-10-CM | POA: Diagnosis not present

## 2021-04-07 DIAGNOSIS — Z8616 Personal history of COVID-19: Secondary | ICD-10-CM | POA: Insufficient documentation

## 2021-04-07 DIAGNOSIS — F17210 Nicotine dependence, cigarettes, uncomplicated: Secondary | ICD-10-CM | POA: Diagnosis not present

## 2021-04-07 DIAGNOSIS — J111 Influenza due to unidentified influenza virus with other respiratory manifestations: Secondary | ICD-10-CM

## 2021-04-07 DIAGNOSIS — J101 Influenza due to other identified influenza virus with other respiratory manifestations: Secondary | ICD-10-CM | POA: Diagnosis not present

## 2021-04-07 LAB — BASIC METABOLIC PANEL
Anion gap: 7 (ref 5–15)
BUN: 15 mg/dL (ref 6–20)
CO2: 21 mmol/L — ABNORMAL LOW (ref 22–32)
Calcium: 8.2 mg/dL — ABNORMAL LOW (ref 8.9–10.3)
Chloride: 99 mmol/L (ref 98–111)
Creatinine, Ser: 0.94 mg/dL (ref 0.61–1.24)
GFR, Estimated: >60 mL/min (ref >60)
Glucose, Bld: 122 mg/dL — ABNORMAL HIGH (ref 70–99)
Potassium: 4.2 mmol/L (ref 3.5–5.1)
Sodium: 127 mmol/L — ABNORMAL LOW (ref 135–145)

## 2021-04-07 LAB — CBC WITH DIFFERENTIAL/PLATELET
Abs Immature Granulocytes: 0.02 10*3/uL (ref 0.00–0.07)
Basophils Absolute: 0 10*3/uL (ref 0.0–0.1)
Basophils Relative: 0 %
Eosinophils Absolute: 0 10*3/uL (ref 0.0–0.5)
Eosinophils Relative: 0 %
HCT: 36.4 % — ABNORMAL LOW (ref 39.0–52.0)
Hemoglobin: 12.6 g/dL — ABNORMAL LOW (ref 13.0–17.0)
Immature Granulocytes: 0 %
Lymphocytes Relative: 14 %
Lymphs Abs: 0.9 10*3/uL (ref 0.7–4.0)
MCH: 35.3 pg — ABNORMAL HIGH (ref 26.0–34.0)
MCHC: 34.6 g/dL (ref 30.0–36.0)
MCV: 102 fL — ABNORMAL HIGH (ref 80.0–100.0)
Monocytes Absolute: 0.7 10*3/uL (ref 0.1–1.0)
Monocytes Relative: 11 %
Neutro Abs: 4.6 10*3/uL (ref 1.7–7.7)
Neutrophils Relative %: 75 %
Platelets: 77 10*3/uL — ABNORMAL LOW (ref 150–400)
RBC: 3.57 MIL/uL — ABNORMAL LOW (ref 4.22–5.81)
RDW: 15.5 % (ref 11.5–15.5)
WBC: 6.2 10*3/uL (ref 4.0–10.5)
nRBC: 0 % (ref 0.0–0.2)

## 2021-04-07 LAB — RESP PANEL BY RT-PCR (FLU A&B, COVID) ARPGX2
Influenza A by PCR: POSITIVE — AB
Influenza B by PCR: NEGATIVE
SARS Coronavirus 2 by RT PCR: NEGATIVE

## 2021-04-07 LAB — GROUP A STREP BY PCR: Group A Strep by PCR: NOT DETECTED

## 2021-04-07 MED ORDER — FLUTICASONE PROPIONATE 50 MCG/ACT NA SUSP: 2.0000 | Freq: Every day | NASAL | 0 refills | Status: DC

## 2021-04-07 MED ORDER — BENZONATATE 100 MG PO CAPS: 100.0000 mg | ORAL_CAPSULE | Freq: Three times a day (TID) | ORAL | 0 refills | Status: DC

## 2021-04-07 MED ORDER — ACETAMINOPHEN 325 MG PO TABS
650.0000 mg | ORAL_TABLET | Freq: Once | ORAL | Status: AC
  Administered 2021-04-07: 650 mg via ORAL
  Filled 2021-04-07: qty 2

## 2021-04-07 NOTE — Discharge Instructions (Addendum)
Influenza test was positive  Take the medications as prescribed  Return for new or worsening symptoms

## 2021-04-07 NOTE — ED Triage Notes (Signed)
Patient BIB [facility] C/o flu like symptoms x 1 week Other family members sick Fever/chills/body aches/nausea
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
## 2021-05-13 ENCOUNTER — Emergency Department (HOSPITAL_COMMUNITY): Payer: Medicare Other

## 2021-05-13 ENCOUNTER — Other Ambulatory Visit: Payer: Self-pay

## 2021-05-13 ENCOUNTER — Emergency Department (HOSPITAL_COMMUNITY)
Admission: EM | Admit: 2021-05-13 | Discharge: 2021-05-14 | Disposition: A | Discharge status: Home / Self Care | Payer: Medicare Other | Attending: Emergency Medicine | Admitting: Emergency Medicine

## 2021-05-13 ENCOUNTER — Encounter (HOSPITAL_COMMUNITY): Payer: Self-pay | Admitting: *Deleted

## 2021-05-13 DIAGNOSIS — F1910 Other psychoactive substance abuse, uncomplicated: Secondary | ICD-10-CM | POA: Diagnosis not present

## 2021-05-13 DIAGNOSIS — R051 Acute cough: Secondary | ICD-10-CM | POA: Insufficient documentation

## 2021-05-13 DIAGNOSIS — K029 Dental caries, unspecified: Secondary | ICD-10-CM | POA: Diagnosis not present

## 2021-05-13 DIAGNOSIS — R0602 Shortness of breath: Secondary | ICD-10-CM | POA: Diagnosis not present

## 2021-05-13 DIAGNOSIS — R591 Generalized enlarged lymph nodes: Secondary | ICD-10-CM | POA: Insufficient documentation

## 2021-05-13 DIAGNOSIS — F17210 Nicotine dependence, cigarettes, uncomplicated: Secondary | ICD-10-CM | POA: Insufficient documentation

## 2021-05-13 DIAGNOSIS — R059 Cough, unspecified: Secondary | ICD-10-CM | POA: Diagnosis not present

## 2021-05-13 DIAGNOSIS — R000 Tachycardia, unspecified: Secondary | ICD-10-CM | POA: Diagnosis not present

## 2021-05-13 DIAGNOSIS — Z20822 Contact with and (suspected) exposure to covid-19: Secondary | ICD-10-CM | POA: Diagnosis not present

## 2021-05-13 DIAGNOSIS — R0902 Hypoxemia: Secondary | ICD-10-CM | POA: Diagnosis not present

## 2021-05-13 DIAGNOSIS — I10 Essential (primary) hypertension: Secondary | ICD-10-CM | POA: Diagnosis not present

## 2021-05-13 DIAGNOSIS — R739 Hyperglycemia, unspecified: Secondary | ICD-10-CM | POA: Diagnosis not present

## 2021-05-13 DIAGNOSIS — K0889 Other specified disorders of teeth and supporting structures: Secondary | ICD-10-CM | POA: Diagnosis not present

## 2021-05-13 LAB — RESP PANEL BY RT-PCR (FLU A&B, COVID) ARPGX2
Influenza A by PCR: NEGATIVE
Influenza B by PCR: NEGATIVE
SARS Coronavirus 2 by RT PCR: NEGATIVE

## 2021-05-13 NOTE — ED Provider Notes (Signed)
Emergency Medicine Provider Triage Evaluation Note  Charles [name] , a 51 y.o. male  was evaluated in triage.  Pt complains of reports 1 week of difficulty breathing with cough, denies fever.  Reports he is unable to sleep however patient has been snoring in triage and had to be woken up for exam Brought in by EMS who reports flu + (04/07/21), difficulty breathing when he tries to sleep x 8 days. +etoh, cocaine  Review of Systems  Positive: Cough, shortness of breath Negative: Fever, chest pain, abdominal pain, vomiting  Physical Exam  There were no vitals taken for this visit. Gen:   Awake, no distress   Resp:  Normal effort  MSK:   Moves extremities without difficulty  Other:  Course lung sounds at bases  Medical Decision Making  Medically screening exam initiated at 10:27 PM.  Appropriate orders placed.  Advit Trethewey was informed that the remainder of the evaluation will be completed by another provider, this initial triage assessment does not replace that evaluation, and the importance of remaining in the ED until their evaluation is complete.     Jeannie Fend, PA-C 05/13/21 2245    Wynetta Fines, MD 05/15/21 [phone]

## 2021-05-13 NOTE — ED Triage Notes (Signed)
Pt states SOB, can't sleep because when he goes to sleep he feels like he cannot breath. +cough. Pt falling asleep in triage, responsive to verbal stimuli.

## 2021-05-13 NOTE — ED Triage Notes (Signed)
Pt arrives via [facility] per EMS report, Diagnosed with flu. When he tries to sleep he says he can't breath. +etoh, cocaine. Lungs are clear. Has not slept in 8 days. 182/100, hr 100, rr 20. 93-96% RA. Denies pain.

## 2021-05-14 ENCOUNTER — Emergency Department (HOSPITAL_COMMUNITY)
Admission: EM | Admit: 2021-05-14 | Discharge: 2021-05-14 | Disposition: A | Discharge status: Home / Self Care | Payer: Medicare Other | Source: Home / Self Care | Attending: Emergency Medicine | Admitting: Emergency Medicine

## 2021-05-14 ENCOUNTER — Other Ambulatory Visit: Payer: Self-pay

## 2021-05-14 ENCOUNTER — Encounter (HOSPITAL_COMMUNITY): Payer: Self-pay | Admitting: *Deleted

## 2021-05-14 DIAGNOSIS — Z79899 Other long term (current) drug therapy: Secondary | ICD-10-CM | POA: Insufficient documentation

## 2021-05-14 DIAGNOSIS — I10 Essential (primary) hypertension: Secondary | ICD-10-CM

## 2021-05-14 DIAGNOSIS — R591 Generalized enlarged lymph nodes: Secondary | ICD-10-CM | POA: Insufficient documentation

## 2021-05-14 DIAGNOSIS — K0889 Other specified disorders of teeth and supporting structures: Secondary | ICD-10-CM

## 2021-05-14 DIAGNOSIS — K029 Dental caries, unspecified: Secondary | ICD-10-CM | POA: Insufficient documentation

## 2021-05-14 DIAGNOSIS — R0602 Shortness of breath: Secondary | ICD-10-CM | POA: Diagnosis not present

## 2021-05-14 DIAGNOSIS — Z0000 Encounter for general adult medical examination without abnormal findings: Secondary | ICD-10-CM

## 2021-05-14 LAB — COMPREHENSIVE METABOLIC PANEL
ALT: 28 U/L (ref 0–44)
AST: 32 U/L (ref 15–41)
Albumin: 2.9 g/dL — ABNORMAL LOW (ref 3.5–5.0)
Alkaline Phosphatase: 70 U/L (ref 38–126)
Anion gap: 8 (ref 5–15)
BUN: 19 mg/dL (ref 6–20)
CO2: 26 mmol/L (ref 22–32)
Calcium: 8.7 mg/dL — ABNORMAL LOW (ref 8.9–10.3)
Chloride: 106 mmol/L (ref 98–111)
Creatinine, Ser: 1.08 mg/dL (ref 0.61–1.24)
GFR, Estimated: >60 mL/min (ref >60)
Glucose, Bld: 133 mg/dL — ABNORMAL HIGH (ref 70–99)
Potassium: 3.6 mmol/L (ref 3.5–5.1)
Sodium: 140 mmol/L (ref 135–145)
Total Bilirubin: 0.8 mg/dL (ref 0.3–1.2)
Total Protein: 6.1 g/dL — ABNORMAL LOW (ref 6.5–8.1)

## 2021-05-14 LAB — CBC
HCT: 33.4 % — ABNORMAL LOW (ref 39.0–52.0)
Hemoglobin: 11.1 g/dL — ABNORMAL LOW (ref 13.0–17.0)
MCH: 35.4 pg — ABNORMAL HIGH (ref 26.0–34.0)
MCHC: 33.2 g/dL (ref 30.0–36.0)
MCV: 106.4 fL — ABNORMAL HIGH (ref 80.0–100.0)
Platelets: 86 10*3/uL — ABNORMAL LOW (ref 150–400)
RBC: 3.14 MIL/uL — ABNORMAL LOW (ref 4.22–5.81)
RDW: 18.1 % — ABNORMAL HIGH (ref 11.5–15.5)
WBC: 3.8 10*3/uL — ABNORMAL LOW (ref 4.0–10.5)
nRBC: 0 % (ref 0.0–0.2)

## 2021-05-14 LAB — ETHANOL: Alcohol, Ethyl (B): <10 mg/dL (ref <10)

## 2021-05-14 MED ORDER — AMOXICILLIN 500 MG PO CAPS: 500.0000 mg | ORAL_CAPSULE | Freq: Three times a day (TID) | ORAL | 0 refills | Status: DC

## 2021-05-14 MED ORDER — IRBESARTAN 75 MG PO TABS
37.5000 mg | ORAL_TABLET | Freq: Every day | ORAL | Status: DC
  Administered 2021-05-14: 37.5 mg via ORAL
  Filled 2021-05-14: qty 0.5

## 2021-05-14 MED ORDER — HYDROCHLOROTHIAZIDE 12.5 MG PO TABS
12.5000 mg | ORAL_TABLET | Freq: Once | ORAL | Status: AC
  Administered 2021-05-14: 12.5 mg via ORAL
  Filled 2021-05-14: qty 1

## 2021-05-14 MED ORDER — AMOXICILLIN 500 MG PO CAPS
500.0000 mg | ORAL_CAPSULE | Freq: Once | ORAL | Status: AC
  Administered 2021-05-14: 500 mg via ORAL
  Filled 2021-05-14: qty 1

## 2021-05-14 MED ORDER — AMLODIPINE BESYLATE 5 MG PO TABS
10.0000 mg | ORAL_TABLET | Freq: Once | ORAL | Status: AC
  Administered 2021-05-14: 10 mg via ORAL
  Filled 2021-05-14: qty 2

## 2021-05-14 NOTE — Discharge Instructions (Signed)
You were evaluated in the Emergency Department and after careful evaluation, we did not find any emergent condition requiring admission or further testing in the hospital.  Your exam/testing today is overall reassuring.  Blood tests and x-ray were normal, recommend follow-up with primary care doctor to further discuss your symptoms.  Please return to the Emergency Department if you experience any worsening of your condition.   Thank you for allowing [place] to be a part of your care.

## 2021-05-14 NOTE — ED Provider Notes (Signed)
[facility] EMERGENCY DEPARTMENT Provider Note   CSN: [id number] Arrival date & time: 05/14/21  0538     History  Chief Complaint  Patient presents with   Shortness of Breath    Galileo Colello is a 51 y.o. male.  HPI  51 year old male with past medical history of HTN, aplastic anemia, polysubstance abuse presents emergency department with concern for brief episodes of shortness of breath mainly in the morning time.  Patient was seen twice overnight 1 for the above complaint and 1 for dental pain.  Patient's previous work-up was reassuring and he was discharged.  Patient at this time does not have any shortness of breath.  No midsternal chest pain.  No upper back pain.  No cough, lower extremity swelling.  Because he has been here in the emergency department for over the past 24 hours has not taken any of his home medications.  Home Medications Prior to Admission medications   Medication Sig Start Date End Date Taking? Authorizing Provider  acetaminophen (TYLENOL) 500 MG tablet Take 1,000 mg by mouth every 6 (six) hours as needed for mild pain.    [provider]  amLODipine (NORVASC) 10 MG tablet Take 1 tablet (10 mg total) by mouth daily. 06/08/20   Jeanella Craze, NP  amoxicillin (AMOXIL) 500 MG capsule Take 1 capsule (500 mg total) by mouth 3 (three) times daily. 05/14/21   Muthersbaugh, Dahlia Client, PA-C  benzonatate (TESSALON) 100 MG capsule Take 1 capsule (100 mg total) by mouth every 8 (eight) hours. 04/07/21   Henderly, Britni A, PA-C  diphenhydrAMINE (BENADRYL) 25 MG tablet Take 25 mg by mouth every 6 (six) hours as needed for allergies.    [provider]  fluticasone (FLONASE) 50 MCG/ACT nasal spray Place 2 sprays into both nostrils daily. 04/07/21   Henderly, Britni A, PA-C  naproxen (NAPROSYN) 500 MG tablet Take 500 mg by mouth 2 (two) times daily as needed for mild pain. 05/17/20   [provider]  valsartan-hydrochlorothiazide  (DIOVAN-HCT) 80-12.5 MG tablet Take 1 tablet by mouth daily. 06/08/20   Jeanella Craze, NP      Allergies    Eggs or egg-derived products, Banana, and Pork-derived products    Review of Systems   Review of Systems  Constitutional:  Negative for chills and fever.  HENT:  Negative for congestion.   Respiratory:  Positive for shortness of breath.   Cardiovascular:  Negative for chest pain, palpitations and leg swelling.  Gastrointestinal:  Negative for abdominal pain, diarrhea and vomiting.  Genitourinary:  Negative for dysuria.  Musculoskeletal:  Negative for back pain.  Skin:  Negative for rash.  Neurological:  Negative for light-headedness and headaches.   Physical Exam Updated Vital Signs BP (!) 158/129    Pulse 87    Temp 98.4 F (36.9 C)    Resp 18    SpO2 95%  Physical Exam Vitals and nursing note reviewed.  Constitutional:      General: He is not in acute distress.    Appearance: Normal appearance. He is not ill-appearing or diaphoretic.  HENT:     Head: Normocephalic.     Mouth/Throat:     Mouth: Mucous membranes are moist.  Cardiovascular:     Rate and Rhythm: Normal rate.  Pulmonary:     Effort: Tachypnea and accessory muscle usage present. No respiratory distress.     Breath sounds: No decreased breath sounds or wheezing.  Chest:     Chest  wall: No tenderness.  Abdominal:     Palpations: Abdomen is soft.     Tenderness: There is no abdominal tenderness.  Musculoskeletal:     Right lower leg: No edema.     Left lower leg: No edema.  Skin:    General: Skin is warm.  Neurological:     Mental Status: He is alert and oriented to person, place, and time. Mental status is at baseline.  Psychiatric:        Mood and Affect: Mood normal.    ED Results / Procedures / Treatments   Labs (all labs ordered are listed, but only abnormal results are displayed) Labs Reviewed - No data to display  EKG None  Radiology DG Chest 2 View  Result Date:  05/13/2021 CLINICAL DATA:  cough, SHOB EXAM: CHEST - 2 VIEW COMPARISON:  Chest x-ray 04/10/2021, CT chest 06/22/2019. chest x-ray 08/02/2016 FINDINGS: The heart and mediastinal contours are unchanged. Aortic calcification. No focal consolidation. No pulmonary edema. No pleural effusion. No pneumothorax. Redemonstration of severe degenerative changes of the right shoulder with underlying chronic lesion not excluded. No acute osseous abnormality. Vascular coilling noted within the left upper abdominal quadrant. IMPRESSION: 1. No active cardiopulmonary disease. 2. Chronic severe degenerative changes of the right shoulder with underlying chronic lesion not excluded. Consider non-emergent dedicated right shoulder radiograph. Electronically Signed   By: Tish Frederickson M.D.   On: 05/13/2021 23:21    Procedures Procedures  Monitored patient's blood pressure and heart rhythm.  Remain normal sinus rhythm, blood pressure probably down trended with home medications for hypertension  Medications Ordered in ED Medications  irbesartan (AVAPRO) tablet 37.5 mg (has no administration in time range)  hydrochlorothiazide (HYDRODIURIL) tablet 12.5 mg (12.5 mg Oral Given 05/14/21 0729)  amLODipine (NORVASC) tablet 10 mg (10 mg Oral Given 05/14/21 5027)    ED Course/ Medical Decision Making/ A&P                           Medical Decision Making  51 year old male presents emergency department for his third ER visit in 24 hours.  Initially was seen for shortness of breath of which he is complaining of currently.  States briefly in the morning he is short of breath but this resolves.  Currently he is symptom-free.  Denies any chest pain, back pain, lower extremity swelling, palpitations.  Was tachycardic in triage but normal heart rate on my evaluation.  Patient has been 24 hours without his home medications due to being here in the ER, he is hypertensive currently.  Suspect hypertension is from noncompliance with home  medications due to his ER visits.  Will give home dose.  Lungs are clear.  Chart review included previous blood work and imaging from his recent ER visit earlier this morning.  His blood work and chest x-ray from his overnight work-up were normal.  Low suspicion for pulmonary embolism given the lack of current symptoms and normal vitals.  After home medications blood pressure is appropriately reduced.  He is been able to eat and drink.  Continues to be symptom-free.  Plan for outpatient follow-up.  Patient at this time appears safe and stable for discharge and will be treated as an outpatient.  Discharge plan and strict return to ED precautions discussed, patient verbalizes understanding and agreement.   Final Clinical Impression(s) / ED Diagnoses Final diagnoses:  None    Rx / DC Orders ED Discharge Orders  None         Rozelle Logan, DO 05/14/21 1016

## 2021-05-14 NOTE — ED Notes (Signed)
Rn reviewed discharge instructions with pt. Pt verbalized understanding and had no further questions.

## 2021-05-14 NOTE — ED Triage Notes (Signed)
Pt says he feels most SOB in the morning time, like he is now.  Pt has been seen twice tonight and d/c from ED, has been sleeping in lobby since last d/c around 0330.  No acute distress.

## 2021-05-14 NOTE — Discharge Instructions (Addendum)
1. Medications: amoxicillin, usual home medications 2. Treatment: rest, drink plenty of fluids, take medications as prescribed 3. Follow Up: Please followup with dentistry within 1 week for discussion of your diagnoses and further evaluation after today's visit; if you do not have a primary care doctor use the resource guide provided to find one; Return to the ER for high fevers, difficulty breathing, difficulty swallowing or other concerning symptoms  

## 2021-05-14 NOTE — Discharge Instructions (Signed)
You have been seen and discharged from the emergency department.  Follow-up with your primary provider for reevaluation and further care. Take home medications as prescribed. If you have any worsening symptoms or further concerns for your health please return to an emergency department for further evaluation. 
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
## 2021-05-14 NOTE — ED Provider Notes (Signed)
[facility] EMERGENCY DEPARTMENT Provider Note   CSN: [id number] Arrival date & time: 05/14/21  0308     History  Chief Complaint  Patient presents with   Sore Throat    Charles [name] is a 51 y.o. male.  The history is provided by the patient and medical records. No language interpreter was used.      Home Medications Prior to Admission medications   Medication Sig Start Date End Date Taking? Authorizing Provider  amoxicillin (AMOXIL) 500 MG capsule Take 1 capsule (500 mg total) by mouth 3 (three) times daily. 05/14/21  Yes Rylee Nuzum, Dahlia Client, PA-C  acetaminophen (TYLENOL) 500 MG tablet Take 1,000 mg by mouth every 6 (six) hours as needed for mild pain.    [provider]  amLODipine (NORVASC) 10 MG tablet Take 1 tablet (10 mg total) by mouth daily. 06/08/20   Jeanella Craze, NP  benzonatate (TESSALON) 100 MG capsule Take 1 capsule (100 mg total) by mouth every 8 (eight) hours. 04/07/21   Henderly, Britni A, PA-C  diphenhydrAMINE (BENADRYL) 25 MG tablet Take 25 mg by mouth every 6 (six) hours as needed for allergies.    [provider]  fluticasone (FLONASE) 50 MCG/ACT nasal spray Place 2 sprays into both nostrils daily. 04/07/21   Henderly, Britni A, PA-C  naproxen (NAPROSYN) 500 MG tablet Take 500 mg by mouth 2 (two) times daily as needed for mild pain. 05/17/20   [provider]  valsartan-hydrochlorothiazide (DIOVAN-HCT) 80-12.5 MG tablet Take 1 tablet by mouth daily. 06/08/20   Jeanella Craze, NP      Allergies    Eggs or egg-derived products, Banana, and Pork-derived products    Review of Systems   Review of Systems  Constitutional:  Negative for chills, fatigue and fever.  HENT:  Positive for dental problem. Negative for congestion, facial swelling, trouble swallowing and voice change.   Gastrointestinal:  Negative for nausea and vomiting.  Musculoskeletal:  Negative for neck pain and neck stiffness.  Skin:  Negative for  rash.  Hematological:  Positive for adenopathy.   Physical Exam Updated Vital Signs BP (!) 173/112    Pulse 98    Resp 18    SpO2 97%  Physical Exam Vitals and nursing note reviewed.  Constitutional:      General: He is not in acute distress.    Appearance: He is well-developed. He is not ill-appearing.  HENT:     Head: Normocephalic.     Nose: Nose normal.     Mouth/Throat:     Mouth: Mucous membranes are moist.     Dentition: Abnormal dentition. Dental caries present.     Tongue: No lesions.     Pharynx: Oropharynx is clear. Uvula midline. No pharyngeal swelling, oropharyngeal exudate, posterior oropharyngeal erythema or uvula swelling.     Comments: Numerous dental caries Mild gingival erythema in the right lower jaw surrounding dental caries Sublingual tissue soft and nontender.  No induration Eyes:     General: No scleral icterus.    Conjunctiva/sclera: Conjunctivae normal.  Neck:     Comments: Phonation normal.  Handling secretions without difficulty Cardiovascular:     Rate and Rhythm: Normal rate.  Pulmonary:     Effort: Pulmonary effort is normal.  Abdominal:     General: There is no distension.  Musculoskeletal:        General: Normal range of motion.     Cervical back: Normal range of motion.  Lymphadenopathy:  Head:     Right side of head: Tonsillar adenopathy present. No submental, submandibular, preauricular, posterior auricular or occipital adenopathy.     Left side of head: Tonsillar adenopathy present. No submental, submandibular, preauricular, posterior auricular or occipital adenopathy.     Comments: Bilateral tonsillar lymphadenopathy right greater than left  Skin:    General: Skin is warm and dry.  Neurological:     Mental Status: He is alert.  Psychiatric:        Mood and Affect: Mood normal.    ED Results / Procedures / Treatments     Procedures Procedures    Medications Ordered in ED Medications  amoxicillin (AMOXIL) capsule 500 mg  (has no administration in time range)    ED Course/ Medical Decision Making/ A&P Clinical Course as of 05/14/21 0333  Mon May 14, 2021  0332 BP(!): 173/112 Noted.  Patient with history of same.  Denies chest pain or shortness of breath. [HM]    Clinical Course User Index [HM] Mauro Arps, [name] Kerbs                           Medical Decision Making  Patient presents with complaints of dental pain and lymphadenopathy.  He was just seen in the emergency department for shortness of breath several hours ago and discharged in good condition.  Considering reactive lymphadenopathy, dental infection, Ludewig's angina.  Additional history obtained:  Previous records obtained and reviewed -including work-up from ED visit several hours ago.  Lab work was reassuring at that time.  Patient without leukocytosis or fever to suggest systemic infection other labs were overall reassuring with only mild anemia.  Negative for COVID and flu at that time..    ED Course:  With moderate lymphadenopathy.  Suspect secondary to dental caries and mild infection.  No clinical evidence of Ludwick's angina.  No change in voice.  Handling secretions without difficulty.  Given work-up several hours ago that was reassuring and do not believe additional work-up is warranted at this time.  Did discuss close follow-up with dentistry and primary care.  Will give amoxicillin.  Patient voiced understanding and agreement wit the current medical evaluation and treatment plan.  Questions were answered to expressed satisfaction.    Strict return precautions given and patient is stable at time of discharge.    Portions of this note were generated with Scientist, clinical (histocompatibility and immunogenetics). Dictation errors may occur despite best attempts at proofreading.         Final Clinical Impression(s) / ED Diagnoses Final diagnoses:  Pain, dental  Lymphadenopathy  Hypertension, unspecified type    Rx / DC Orders ED Discharge Orders           Ordered    amoxicillin (AMOXIL) 500 MG capsule  3 times daily        05/14/21 0325              Pallas Wahlert, Dahlia Client, PA-C 05/14/21 9373    Zadie Rhine, MD 05/14/21 0710

## 2021-05-14 NOTE — ED Triage Notes (Signed)
Pt says his tongue has been swollen for two days and his glands are swollen. Pt was just evaluated in ED for SOB and d/c.

## 2021-05-26 ENCOUNTER — Other Ambulatory Visit: Payer: Self-pay

## 2021-05-26 ENCOUNTER — Encounter (HOSPITAL_COMMUNITY): Payer: Self-pay

## 2021-05-26 ENCOUNTER — Emergency Department (HOSPITAL_COMMUNITY): Payer: Medicare Other

## 2021-05-26 ENCOUNTER — Inpatient Hospital Stay (HOSPITAL_COMMUNITY)
Admission: EM | Admit: 2021-05-26 | Discharge: 2021-06-03 | DRG: 189 | Disposition: A | Discharge status: Home / Self Care | Payer: Medicare Other | Attending: Internal Medicine | Admitting: Internal Medicine

## 2021-05-26 DIAGNOSIS — I517 Cardiomegaly: Secondary | ICD-10-CM | POA: Diagnosis not present

## 2021-05-26 DIAGNOSIS — D696 Thrombocytopenia, unspecified: Secondary | ICD-10-CM | POA: Diagnosis present

## 2021-05-26 DIAGNOSIS — Z5900 Homelessness unspecified: Secondary | ICD-10-CM | POA: Diagnosis not present

## 2021-05-26 DIAGNOSIS — J189 Pneumonia, unspecified organism: Secondary | ICD-10-CM | POA: Diagnosis present

## 2021-05-26 DIAGNOSIS — R739 Hyperglycemia, unspecified: Secondary | ICD-10-CM | POA: Diagnosis present

## 2021-05-26 DIAGNOSIS — Z5902 Unsheltered homelessness: Secondary | ICD-10-CM | POA: Diagnosis not present

## 2021-05-26 DIAGNOSIS — Z9114 Patient's other noncompliance with medication regimen: Secondary | ICD-10-CM | POA: Diagnosis not present

## 2021-05-26 DIAGNOSIS — R000 Tachycardia, unspecified: Secondary | ICD-10-CM | POA: Diagnosis not present

## 2021-05-26 DIAGNOSIS — F1910 Other psychoactive substance abuse, uncomplicated: Secondary | ICD-10-CM | POA: Diagnosis not present

## 2021-05-26 DIAGNOSIS — Z6825 Body mass index (BMI) 25.0-25.9, adult: Secondary | ICD-10-CM

## 2021-05-26 DIAGNOSIS — J441 Chronic obstructive pulmonary disease with (acute) exacerbation: Secondary | ICD-10-CM | POA: Diagnosis present

## 2021-05-26 DIAGNOSIS — T380X5A Adverse effect of glucocorticoids and synthetic analogues, initial encounter: Secondary | ICD-10-CM | POA: Diagnosis present

## 2021-05-26 DIAGNOSIS — F17210 Nicotine dependence, cigarettes, uncomplicated: Secondary | ICD-10-CM | POA: Diagnosis present

## 2021-05-26 DIAGNOSIS — G4730 Sleep apnea, unspecified: Secondary | ICD-10-CM | POA: Diagnosis not present

## 2021-05-26 DIAGNOSIS — J440 Chronic obstructive pulmonary disease with acute lower respiratory infection: Secondary | ICD-10-CM | POA: Diagnosis not present

## 2021-05-26 DIAGNOSIS — I5033 Acute on chronic diastolic (congestive) heart failure: Secondary | ICD-10-CM | POA: Diagnosis not present

## 2021-05-26 DIAGNOSIS — R079 Chest pain, unspecified: Secondary | ICD-10-CM | POA: Insufficient documentation

## 2021-05-26 DIAGNOSIS — R7881 Bacteremia: Secondary | ICD-10-CM | POA: Diagnosis present

## 2021-05-26 DIAGNOSIS — F1410 Cocaine abuse, uncomplicated: Secondary | ICD-10-CM | POA: Diagnosis present

## 2021-05-26 DIAGNOSIS — E663 Overweight: Secondary | ICD-10-CM | POA: Diagnosis present

## 2021-05-26 DIAGNOSIS — I160 Hypertensive urgency: Secondary | ICD-10-CM | POA: Diagnosis present

## 2021-05-26 DIAGNOSIS — Z20822 Contact with and (suspected) exposure to covid-19: Secondary | ICD-10-CM | POA: Diagnosis not present

## 2021-05-26 DIAGNOSIS — J181 Lobar pneumonia, unspecified organism: Secondary | ICD-10-CM | POA: Diagnosis not present

## 2021-05-26 DIAGNOSIS — I5043 Acute on chronic combined systolic (congestive) and diastolic (congestive) heart failure: Secondary | ICD-10-CM | POA: Diagnosis not present

## 2021-05-26 DIAGNOSIS — J1289 Other viral pneumonia: Secondary | ICD-10-CM | POA: Diagnosis present

## 2021-05-26 DIAGNOSIS — B1920 Unspecified viral hepatitis C without hepatic coma: Secondary | ICD-10-CM | POA: Diagnosis not present

## 2021-05-26 DIAGNOSIS — I5041 Acute combined systolic (congestive) and diastolic (congestive) heart failure: Secondary | ICD-10-CM | POA: Diagnosis not present

## 2021-05-26 DIAGNOSIS — D619 Aplastic anemia, unspecified: Secondary | ICD-10-CM | POA: Diagnosis not present

## 2021-05-26 DIAGNOSIS — I110 Hypertensive heart disease with heart failure: Secondary | ICD-10-CM | POA: Diagnosis present

## 2021-05-26 DIAGNOSIS — J9601 Acute respiratory failure with hypoxia: Secondary | ICD-10-CM | POA: Diagnosis not present

## 2021-05-26 DIAGNOSIS — Z91199 Patient's noncompliance with other medical treatment and regimen due to unspecified reason: Secondary | ICD-10-CM | POA: Diagnosis not present

## 2021-05-26 DIAGNOSIS — I209 Angina pectoris, unspecified: Secondary | ICD-10-CM | POA: Insufficient documentation

## 2021-05-26 DIAGNOSIS — B182 Chronic viral hepatitis C: Secondary | ICD-10-CM | POA: Insufficient documentation

## 2021-05-26 DIAGNOSIS — R7303 Prediabetes: Secondary | ICD-10-CM | POA: Diagnosis present

## 2021-05-26 DIAGNOSIS — I5032 Chronic diastolic (congestive) heart failure: Secondary | ICD-10-CM | POA: Diagnosis present

## 2021-05-26 DIAGNOSIS — K0889 Other specified disorders of teeth and supporting structures: Secondary | ICD-10-CM | POA: Diagnosis not present

## 2021-05-26 DIAGNOSIS — F17290 Nicotine dependence, other tobacco product, uncomplicated: Secondary | ICD-10-CM | POA: Diagnosis not present

## 2021-05-26 DIAGNOSIS — B9689 Other specified bacterial agents as the cause of diseases classified elsewhere: Secondary | ICD-10-CM | POA: Diagnosis present

## 2021-05-26 DIAGNOSIS — R0689 Other abnormalities of breathing: Secondary | ICD-10-CM | POA: Diagnosis not present

## 2021-05-26 LAB — I-STAT VENOUS BLOOD GAS, ED
Acid-Base Excess: 0 mmol/L (ref 0.0–2.0)
Bicarbonate: 27.7 mmol/L (ref 20.0–28.0)
Calcium, Ion: 1.15 mmol/L (ref 1.15–1.40)
HCT: 39 % (ref 39.0–52.0)
Hemoglobin: 13.3 g/dL (ref 13.0–17.0)
O2 Saturation: 53 %
Potassium: 4.3 mmol/L (ref 3.5–5.1)
Sodium: 144 mmol/L (ref 135–145)
TCO2: 29 mmol/L (ref 22–32)
pCO2, Ven: 54.4 mmHg (ref 44.0–60.0)
pH, Ven: 7.315 (ref 7.250–7.430)
pO2, Ven: 31 mmHg — CL (ref 32.0–45.0)

## 2021-05-26 LAB — CBC WITH DIFFERENTIAL/PLATELET
Abs Immature Granulocytes: 0.01 10*3/uL (ref 0.00–0.07)
Basophils Absolute: 0 10*3/uL (ref 0.0–0.1)
Basophils Relative: 0 %
Eosinophils Absolute: 0 10*3/uL (ref 0.0–0.5)
Eosinophils Relative: 0 %
HCT: 40.9 % (ref 39.0–52.0)
Hemoglobin: 13.5 g/dL (ref 13.0–17.0)
Immature Granulocytes: 0 %
Lymphocytes Relative: 20 %
Lymphs Abs: 1 10*3/uL (ref 0.7–4.0)
MCH: 36.4 pg — ABNORMAL HIGH (ref 26.0–34.0)
MCHC: 33 g/dL (ref 30.0–36.0)
MCV: 110.2 fL — ABNORMAL HIGH (ref 80.0–100.0)
Monocytes Absolute: 0.5 10*3/uL (ref 0.1–1.0)
Monocytes Relative: 10 %
Neutro Abs: 3.5 10*3/uL (ref 1.7–7.7)
Neutrophils Relative %: 70 %
Platelets: 102 10*3/uL — ABNORMAL LOW (ref 150–400)
RBC: 3.71 MIL/uL — ABNORMAL LOW (ref 4.22–5.81)
RDW: 18.7 % — ABNORMAL HIGH (ref 11.5–15.5)
WBC: 5 10*3/uL (ref 4.0–10.5)
nRBC: 0 % (ref 0.0–0.2)

## 2021-05-26 LAB — PROCALCITONIN: Procalcitonin: 0.45 ng/mL

## 2021-05-26 LAB — BLOOD GAS, VENOUS
Acid-Base Excess: 1 mmol/L (ref 0.0–2.0)
Bicarbonate: 25.3 mmol/L (ref 20.0–28.0)
Drawn by: 164
FIO2: 28
O2 Saturation: 73.5 %
Patient temperature: 37
pCO2, Ven: 41.4 mmHg — ABNORMAL LOW (ref 44.0–60.0)
pH, Ven: 7.403 (ref 7.250–7.430)
pO2, Ven: 43.3 mmHg (ref 32.0–45.0)

## 2021-05-26 LAB — BASIC METABOLIC PANEL
Anion gap: 9 (ref 5–15)
BUN: 13 mg/dL (ref 6–20)
CO2: 21 mmol/L — ABNORMAL LOW (ref 22–32)
Calcium: 8.5 mg/dL — ABNORMAL LOW (ref 8.9–10.3)
Chloride: 109 mmol/L (ref 98–111)
Creatinine, Ser: 1.24 mg/dL (ref 0.61–1.24)
GFR, Estimated: >60 mL/min (ref >60)
Glucose, Bld: 248 mg/dL — ABNORMAL HIGH (ref 70–99)
Potassium: 4.3 mmol/L (ref 3.5–5.1)
Sodium: 139 mmol/L (ref 135–145)

## 2021-05-26 LAB — HEPATIC FUNCTION PANEL
ALT: 28 U/L (ref 0–44)
AST: 40 U/L (ref 15–41)
Albumin: 3.2 g/dL — ABNORMAL LOW (ref 3.5–5.0)
Alkaline Phosphatase: 71 U/L (ref 38–126)
Bilirubin, Direct: 0.2 mg/dL (ref 0.0–0.2)
Indirect Bilirubin: 0.7 mg/dL (ref 0.3–0.9)
Total Bilirubin: 0.9 mg/dL (ref 0.3–1.2)
Total Protein: 7.2 g/dL (ref 6.5–8.1)

## 2021-05-26 LAB — HIV ANTIBODY (ROUTINE TESTING W REFLEX): HIV Screen 4th Generation wRfx: NONREACTIVE

## 2021-05-26 LAB — HEMOGLOBIN A1C
Hgb A1c MFr Bld: 6 % — ABNORMAL HIGH (ref 4.8–5.6)
Mean Plasma Glucose: 125.5 mg/dL

## 2021-05-26 LAB — RESP PANEL BY RT-PCR (FLU A&B, COVID) ARPGX2
Influenza A by PCR: NEGATIVE
Influenza B by PCR: NEGATIVE
SARS Coronavirus 2 by RT PCR: NEGATIVE

## 2021-05-26 LAB — BRAIN NATRIURETIC PEPTIDE: B Natriuretic Peptide: 802.1 pg/mL — ABNORMAL HIGH (ref 0.0–100.0)

## 2021-05-26 LAB — TROPONIN I (HIGH SENSITIVITY): Troponin I (High Sensitivity): 43 ng/L — ABNORMAL HIGH (ref <18)

## 2021-05-26 LAB — LACTIC ACID, PLASMA
Lactic Acid, Venous: 1.4 mmol/L (ref 0.5–1.9)
Lactic Acid, Venous: 1.5 mmol/L (ref 0.5–1.9)

## 2021-05-26 LAB — MRSA NEXT GEN BY PCR, NASAL: MRSA by PCR Next Gen: NOT DETECTED

## 2021-05-26 MED ORDER — NICOTINE 21 MG/24HR TD PT24
21.0000 mg | MEDICATED_PATCH | Freq: Every day | TRANSDERMAL | Status: DC
  Administered 2021-05-26 – 2021-06-03 (×9): 21 mg via TRANSDERMAL
  Filled 2021-05-26 (×9): qty 1

## 2021-05-26 MED ORDER — SODIUM CHLORIDE 0.9 % IV SOLN
500.0000 mg | Freq: Once | INTRAVENOUS | Status: AC
  Administered 2021-05-26: 500 mg via INTRAVENOUS
  Filled 2021-05-26: qty 5

## 2021-05-26 MED ORDER — GUAIFENESIN ER 600 MG PO TB12
600.0000 mg | ORAL_TABLET | Freq: Two times a day (BID) | ORAL | Status: DC
  Administered 2021-05-26 – 2021-06-03 (×16): 600 mg via ORAL
  Filled 2021-05-26 (×16): qty 1

## 2021-05-26 MED ORDER — SODIUM CHLORIDE 0.9 % IV SOLN
1.0000 g | Freq: Once | INTRAVENOUS | Status: AC
  Administered 2021-05-26: 1 g via INTRAVENOUS
  Filled 2021-05-26: qty 10

## 2021-05-26 MED ORDER — ACETAMINOPHEN 650 MG RE SUPP: 650.0000 mg | Freq: Four times a day (QID) | RECTAL | Status: DC | PRN

## 2021-05-26 MED ORDER — FUROSEMIDE 10 MG/ML IJ SOLN
40.0000 mg | Freq: Two times a day (BID) | INTRAMUSCULAR | Status: AC
  Administered 2021-05-26 (×2): 40 mg via INTRAVENOUS
  Filled 2021-05-26 (×2): qty 4

## 2021-05-26 MED ORDER — ALBUTEROL SULFATE (2.5 MG/3ML) 0.083% IN NEBU: 2.5000 mg | INHALATION_SOLUTION | RESPIRATORY_TRACT | Status: DC | PRN

## 2021-05-26 MED ORDER — METHYLPREDNISOLONE SODIUM SUCC 40 MG IJ SOLR
40.0000 mg | Freq: Two times a day (BID) | INTRAMUSCULAR | Status: DC
  Administered 2021-05-26 (×2): 40 mg via INTRAVENOUS
  Filled 2021-05-26 (×2): qty 1

## 2021-05-26 MED ORDER — IPRATROPIUM-ALBUTEROL 0.5-2.5 (3) MG/3ML IN SOLN
3.0000 mL | Freq: Four times a day (QID) | RESPIRATORY_TRACT | Status: DC
  Administered 2021-05-26 – 2021-05-27 (×4): 3 mL via RESPIRATORY_TRACT
  Filled 2021-05-26 (×4): qty 3

## 2021-05-26 MED ORDER — SODIUM CHLORIDE 0.9% FLUSH
3.0000 mL | Freq: Two times a day (BID) | INTRAVENOUS | Status: DC
  Administered 2021-05-26 – 2021-06-02 (×13): 3 mL via INTRAVENOUS

## 2021-05-26 MED ORDER — ACETAMINOPHEN 325 MG PO TABS
650.0000 mg | ORAL_TABLET | Freq: Four times a day (QID) | ORAL | Status: DC | PRN
  Administered 2021-06-02 – 2021-06-03 (×2): 650 mg via ORAL
  Filled 2021-05-26 (×2): qty 2
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
## 2021-05-26 NOTE — ED Triage Notes (Signed)
Pt biba from side of road with complaints of shortness of breath. Pt given albuterol, duoneb and 125mg  solumedrol IV PTA.

## 2021-05-26 NOTE — Progress Notes (Signed)
Carryover admit to day admitter.  I discussed this patient's case with the EDP, Dr. Christy Gentles.  Per these discussions:  This is a 51 year old male with history of COPD, who is being admitted for acute hypoxic respiratory failure due to suspected acute COPD exacerbation, with potential multifocal pneumonia observed on chest x-ray, after being brought in by EMS with 1 day of shortness of breath, having received Solu-Medrol 125 mg IV x1 in route.  Empirically started on BiPAP upon arrival in the ED, with ensuing significant provement in work of breathing.  Currently satting in the high 90s to 100% on BiPAP.  Long-term tobacco smoker, with documentation of a history of recreational drug use, including history of cocaine use. Remains on BiPAP at this time.  Initiated on antibiotics in the form of azithromycin and Rocephin, with preceding collection of blood cultures x2.  COVID-19/influenza negative.  I placed order for inpatient admission to the PCU for further evaluation management of acute hypoxic respiratory failure in the setting of suspected acute COPD exacerbation.  I have also placed some preliminary admission orders via the adult multi morbid admission order set.  Additionally, have ordered VBG, urinary drug screen.  Prn albuterol nebulizer.      Babs Bertin, DO Hospitalist

## 2021-05-26 NOTE — ED Provider Notes (Signed)
[facility] EMERGENCY DEPARTMENT Provider Note   CSN: [id number] Arrival date & time: 05/26/21  0424     History  Chief Complaint  Patient presents with   Shortness of Breath    Given 10 mg albuterol, 1 duoneb and 125mg  solumedrol  Level 5 caveat due to respiratory distress  Charles [name] is a 51 y.o. male.  The history is provided by the patient and the EMS personnel. The history is limited by the condition of the patient.  Shortness of Breath Severity:  Severe Onset quality:  Sudden Timing:  Constant Progression:  Worsening Chronicity:  New Relieved by:  Nothing Worsened by:  Nothing Patient presents via EMS.  He was found on the side of the road reporting short of breath.  He was given albuterol, Solu-Medrol with minimal improvement.  No other details are known on arrival    Home Medications Prior to Admission medications   Medication Sig Start Date End Date Taking? Authorizing Provider  acetaminophen (TYLENOL) 500 MG tablet Take 1,000 mg by mouth every 6 (six) hours as needed for mild pain.    [provider]  amLODipine (NORVASC) 10 MG tablet Take 1 tablet (10 mg total) by mouth daily. 06/08/20   Jeanella Craze, NP  diphenhydrAMINE (BENADRYL) 25 MG tablet Take 25 mg by mouth every 6 (six) hours as needed for allergies.    [provider]  fluticasone (FLONASE) 50 MCG/ACT nasal spray Place 2 sprays into both nostrils daily. 04/07/21   Henderly, Britni A, PA-C  naproxen (NAPROSYN) 500 MG tablet Take 500 mg by mouth 2 (two) times daily as needed for mild pain. 05/17/20   [provider]  valsartan-hydrochlorothiazide (DIOVAN-HCT) 80-12.5 MG tablet Take 1 tablet by mouth daily. 06/08/20   Jeanella Craze, NP      Allergies    Eggs or egg-derived products, Banana, and Pork-derived products    Review of Systems   Review of Systems  Unable to perform ROS: Severe respiratory distress  Respiratory:  Positive for shortness of  breath.    Physical Exam Updated Vital Signs BP (!) 189/138    Pulse (!) 105    Resp (!) 30    SpO2 100%  Physical Exam CONSTITUTIONAL: Disheveled, ill-appearing HEAD: Normocephalic/atraumatic EYES: EOMI/PERRL ENMT: Mucous membranes moist, nebulizer mask in place NECK: supple no meningeal signs, JVD SPINE/BACK:entire spine nontender CV: S1/S2 noted, tachycardic LUNGS: Respiratory distress noted, wheezing bilaterally, unable to speak ABDOMEN: soft, nontender, no rebound or guarding, bowel sounds noted throughout abdomen GU:no cva tenderness NEURO: Pt is awake/alert, moves all extremitiesx4.  No facial droop.   EXTREMITIES: pulses normal/equal, full ROM, no lower extremity edema SKIN: warm, color normal PSYCH: Anxious  ED Results / Procedures / Treatments   Labs (all labs ordered are listed, but only abnormal results are displayed) Labs Reviewed  BASIC METABOLIC PANEL - Abnormal; Notable for the following components:      Result Value   CO2 21 (*)    Glucose, Bld 248 (*)    Calcium 8.5 (*)    All other components within normal limits  CBC WITH DIFFERENTIAL/PLATELET - Abnormal; Notable for the following components:   RBC 3.71 (*)    MCV 110.2 (*)    MCH 36.4 (*)    RDW 18.7 (*)    Platelets 102 (*)    All other components within normal limits  BRAIN NATRIURETIC PEPTIDE - Abnormal; Notable for the following components:   B Natriuretic Peptide 802.1 (*)  All other components within normal limits  RESP PANEL BY RT-PCR (FLU A&B, COVID) ARPGX2  CULTURE, BLOOD (ROUTINE X 2)  CULTURE, BLOOD (ROUTINE X 2)  LACTIC ACID, PLASMA  LACTIC ACID, PLASMA  HIV ANTIBODY (ROUTINE TESTING W REFLEX)  BLOOD GAS, VENOUS  RAPID URINE DRUG SCREEN, HOSP PERFORMED    EKG EKG Interpretation  Date/Time:  Saturday May 26 2021 04:39:00 EST Ventricular Rate:  109 PR Interval:  154 QRS Duration: 67 QT Interval:  345 QTC Calculation: 465 R Axis:   72 Text Interpretation: Sinus  tachycardia Probable left atrial enlargement Left ventricular hypertrophy Nonspecific T abnormalities, lateral leads Confirmed by Zadie Rhine ([id number]) on 05/26/2021 4:41:47 AM  Radiology DG Chest Port 1 View  Result Date: 05/26/2021 CLINICAL DATA:  51 year old male with history of shortness of breath. EXAM: PORTABLE CHEST 1 VIEW COMPARISON:  Chest x-ray 05/13/2021. FINDINGS: Lung volumes are normal. Widespread but patchy asymmetrically distributed areas of interstitial prominence, peribronchial cuffing and poorly defined opacities are noted in the lungs bilaterally (right greater than left). Relative sparing of the left upper lobe. No pneumothorax. No pleural effusions. No definite cephalization of the pulmonary vasculature. Mild-to-moderate cardiomegaly. The patient is rotated to the right on today's exam, resulting in distortion of the mediastinal contours and reduced diagnostic sensitivity and specificity for mediastinal pathology. IMPRESSION: 1. The appearance the chest suggests severe multilobar bilateral bronchopneumonia (right greater than left). 2. Mild-to-moderate cardiomegaly. Electronically Signed   By: Trudie Reed M.D.   On: 05/26/2021 05:04    Procedures .Critical Care Performed by: Zadie Rhine, MD Authorized by: Zadie Rhine, MD   Critical care provider statement:    Critical care time (minutes):  75   Critical care start time:  05/26/2021 4:45 AM   Critical care end time:  05/26/2021 6:00 AM   Critical care time was exclusive of:  Separately billable procedures and treating other patients   Critical care was necessary to treat or prevent imminent or life-threatening deterioration of the following conditions:  Respiratory failure   Critical care was time spent personally by me on the following activities:  Development of treatment plan with patient or surrogate, discussions with consultants, evaluation of patient's response to treatment, examination of patient,  re-evaluation of patient's condition, pulse oximetry, ordering and review of radiographic studies, ordering and review of laboratory studies, ordering and performing treatments and interventions and ventilator management   I assumed direction of critical care for this patient from another provider in my specialty: no     Care discussed with: admitting provider      Medications Ordered in ED Medications - No data to display  ED Course/ Medical Decision Making/ A&P Clinical Course as of 05/26/21 0653  Sat May 26, 2021  0500 Patient seen on arrival, he was in obvious respiratory distress.  Patient was essentially unable to speak due to significant distress.  He was placed on noninvasive ventilation.  We will follow closely [DW]  0536 Glucose(!): 248 Hyperglycemia noted [DW]  0536 Patient is improving on noninvasive ventilation. X-ray reviewed reveals possible pneumonia, will start antibiotic [DW]    Clinical Course User Index [DW] Zadie Rhine, MD                           Medical Decision Making  This patient presents to the ED for concern of shortness of breath, this involves an extensive number of treatment options, and is a complaint that carries with it a high  risk of complications and morbidity.  The differential diagnosis includes pneumonia, CHF, pulmonary embolism, pneumothorax, acute coronary syndrome, status asthmaticus  Comorbidities that complicate the patient evaluation: Patients presentation is complicated by their history of substance abuse/nonadherence  Social Determinants of Health: Patients substance abuse increases the complexity of managing their presentation  Additional history obtained:  Records reviewed previous admission documents  Lab Tests: I Ordered, and personally interpreted labs.  The pertinent results include: Hyperglycemia  Imaging Studies ordered: I ordered imaging studies including X-ray chest I independently visualized and interpreted  imaging which showed multifocal pneumonia I agree with the radiologist interpretation  Cardiac Monitoring: The patient was maintained on a cardiac monitor.  I personally viewed and interpreted the cardiac monitor which showed an underlying rhythm of:  sinus tachycardia  Medicines ordered and prescription drug management: I ordered medication including Rocephin and azithromycin for pneumonia Reevaluation of the patient after these medicines showed that the patient    improved   Critical Interventions:       Noninvasive ventilation  Consultations Obtained: I requested consultation with the admitting physician Dr. Arlean Hopping, and discussed  findings as well as pertinent plan - they recommend: admission  Reevaluation: After the interventions noted above, I reevaluated the patient and found that they have :improved  Complexity of problems addressed: Patients presentation is most consistent with  acute presentation with potential threat to life or bodily function      Disposition: After consideration of the diagnostic results and the patients response to treatment,  I feel that the patent would benefit from admission .   Patient presents with respiratory distress.  He responded noninvasive ventilation.  Clinical picture was consistent with hypertensive emergency/pulmonary edema, but x-ray was read out as pneumonia.  IV antibiotics were started.  Patient is much improved after BiPAP Patient stabilized Emergency Department.  Admitted to hospitalist service         Final Clinical Impression(s) / ED Diagnoses Final diagnoses:  Acute respiratory failure with hypoxia [facility])  Multifocal pneumonia    Rx / DC Orders ED Discharge Orders     None         Zadie Rhine, MD 05/26/21 [phone]

## 2021-05-26 NOTE — Progress Notes (Signed)
Pt taken off bipap per MD. Pt is tolerating well. RT will continue to monitor.

## 2021-05-27 ENCOUNTER — Other Ambulatory Visit (HOSPITAL_COMMUNITY): Payer: Medicare Other

## 2021-05-27 DIAGNOSIS — R7881 Bacteremia: Secondary | ICD-10-CM | POA: Diagnosis present

## 2021-05-27 DIAGNOSIS — B9689 Other specified bacterial agents as the cause of diseases classified elsewhere: Secondary | ICD-10-CM

## 2021-05-27 DIAGNOSIS — R739 Hyperglycemia, unspecified: Secondary | ICD-10-CM | POA: Diagnosis present

## 2021-05-27 DIAGNOSIS — I5032 Chronic diastolic (congestive) heart failure: Secondary | ICD-10-CM | POA: Diagnosis present

## 2021-05-27 DIAGNOSIS — I5033 Acute on chronic diastolic (congestive) heart failure: Secondary | ICD-10-CM

## 2021-05-27 LAB — BLOOD CULTURE ID PANEL (REFLEXED) - BCID2

## 2021-05-27 LAB — URINALYSIS, ROUTINE W REFLEX MICROSCOPIC
Bilirubin Urine: NEGATIVE
Glucose, UA: NEGATIVE mg/dL
Hgb urine dipstick: NEGATIVE
Ketones, ur: NEGATIVE mg/dL
Leukocytes,Ua: NEGATIVE
Nitrite: NEGATIVE
Protein, ur: NEGATIVE mg/dL
Specific Gravity, Urine: <1.005 — ABNORMAL LOW (ref 1.005–1.030)
pH: 6.5 (ref 5.0–8.0)

## 2021-05-27 LAB — GLUCOSE, CAPILLARY
Glucose-Capillary: 175 mg/dL — ABNORMAL HIGH (ref 70–99)
Glucose-Capillary: 258 mg/dL — ABNORMAL HIGH (ref 70–99)
Glucose-Capillary: 176 mg/dL — ABNORMAL HIGH (ref 70–99)

## 2021-05-27 LAB — COMPREHENSIVE METABOLIC PANEL
ALT: 25 U/L (ref 0–44)
AST: 35 U/L (ref 15–41)
Albumin: 3.1 g/dL — ABNORMAL LOW (ref 3.5–5.0)
Alkaline Phosphatase: 68 U/L (ref 38–126)
Anion gap: 12 (ref 5–15)
BUN: 25 mg/dL — ABNORMAL HIGH (ref 6–20)
CO2: 20 mmol/L — ABNORMAL LOW (ref 22–32)
Calcium: 9 mg/dL (ref 8.9–10.3)
Chloride: 102 mmol/L (ref 98–111)
Creatinine, Ser: 1.15 mg/dL (ref 0.61–1.24)
GFR, Estimated: >60 mL/min (ref >60)
Glucose, Bld: 136 mg/dL — ABNORMAL HIGH (ref 70–99)
Potassium: 4.8 mmol/L (ref 3.5–5.1)
Sodium: 134 mmol/L — ABNORMAL LOW (ref 135–145)
Total Bilirubin: 0.9 mg/dL (ref 0.3–1.2)
Total Protein: 6.5 g/dL (ref 6.5–8.1)

## 2021-05-27 LAB — RAPID URINE DRUG SCREEN, HOSP PERFORMED
Amphetamines: NOT DETECTED
Barbiturates: NOT DETECTED
Benzodiazepines: NOT DETECTED
Cocaine: POSITIVE — AB
Opiates: NOT DETECTED
Tetrahydrocannabinol: NOT DETECTED

## 2021-05-27 LAB — CBC
HCT: 36.1 % — ABNORMAL LOW (ref 39.0–52.0)
Hemoglobin: 12.5 g/dL — ABNORMAL LOW (ref 13.0–17.0)
MCH: 36.2 pg — ABNORMAL HIGH (ref 26.0–34.0)
MCHC: 34.6 g/dL (ref 30.0–36.0)
MCV: 104.6 fL — ABNORMAL HIGH (ref 80.0–100.0)
Platelets: 106 10*3/uL — ABNORMAL LOW (ref 150–400)
RBC: 3.45 MIL/uL — ABNORMAL LOW (ref 4.22–5.81)
RDW: 18 % — ABNORMAL HIGH (ref 11.5–15.5)
WBC: 10 10*3/uL (ref 4.0–10.5)
nRBC: 0 % (ref 0.0–0.2)

## 2021-05-27 LAB — VITAMIN B12: Vitamin B-12: 370 pg/mL (ref 180–914)

## 2021-05-27 LAB — FOLATE: Folate: 9 ng/mL (ref >5.9)

## 2021-05-27 LAB — STREP PNEUMONIAE URINARY ANTIGEN: Strep Pneumo Urinary Antigen: NEGATIVE

## 2021-05-27 LAB — HEMOGLOBIN A1C
Hgb A1c MFr Bld: 6.1 % — ABNORMAL HIGH (ref 4.8–5.6)
Mean Plasma Glucose: 128.37 mg/dL

## 2021-05-27 MED ORDER — METHYLPREDNISOLONE SODIUM SUCC 125 MG IJ SOLR
80.0000 mg | Freq: Every day | INTRAMUSCULAR | Status: DC
  Administered 2021-05-27 – 2021-05-28 (×2): 80 mg via INTRAVENOUS
  Filled 2021-05-27 (×2): qty 2

## 2021-05-27 MED ORDER — FUROSEMIDE 10 MG/ML IJ SOLN
40.0000 mg | Freq: Every day | INTRAMUSCULAR | Status: DC
  Administered 2021-05-27 – 2021-06-01 (×6): 40 mg via INTRAVENOUS
  Filled 2021-05-27 (×6): qty 4

## 2021-05-27 MED ORDER — SODIUM CHLORIDE 0.9 % IV SOLN
500.0000 mg | INTRAVENOUS | Status: DC
  Administered 2021-05-27 – 2021-05-28 (×2): 500 mg via INTRAVENOUS
  Filled 2021-05-27 (×2): qty 5

## 2021-05-27 MED ORDER — INSULIN ASPART 100 UNIT/ML IJ SOLN
0.0000 [IU] | Freq: Three times a day (TID) | INTRAMUSCULAR | Status: DC
  Administered 2021-05-27: 5 [IU] via SUBCUTANEOUS
  Administered 2021-05-27: 2 [IU] via SUBCUTANEOUS
  Administered 2021-05-28 – 2021-05-29 (×4): 1 [IU] via SUBCUTANEOUS
  Administered 2021-05-29: 3 [IU] via SUBCUTANEOUS
  Administered 2021-05-30 – 2021-05-31 (×2): 1 [IU] via SUBCUTANEOUS
  Administered 2021-06-02: 2 [IU] via SUBCUTANEOUS
  Administered 2021-06-03: 1 [IU] via SUBCUTANEOUS

## 2021-05-27 MED ORDER — AMLODIPINE BESYLATE 10 MG PO TABS
10.0000 mg | ORAL_TABLET | Freq: Every day | ORAL | Status: DC
  Administered 2021-05-27 – 2021-05-28 (×2): 10 mg via ORAL
  Filled 2021-05-27 (×2): qty 1

## 2021-05-27 MED ORDER — IRBESARTAN 150 MG PO TABS
75.0000 mg | ORAL_TABLET | Freq: Every day | ORAL | Status: DC
  Administered 2021-05-27 – 2021-05-28 (×2): 75 mg via ORAL
  Filled 2021-05-27 (×2): qty 1

## 2021-05-27 MED ORDER — SODIUM CHLORIDE 0.9 % IV SOLN
2.0000 g | Freq: Three times a day (TID) | INTRAVENOUS | Status: DC
  Administered 2021-05-27 – 2021-05-28 (×5): 2 g via INTRAVENOUS
  Filled 2021-05-27 (×4): qty 2

## 2021-05-27 MED ORDER — IPRATROPIUM-ALBUTEROL 0.5-2.5 (3) MG/3ML IN SOLN
3.0000 mL | Freq: Three times a day (TID) | RESPIRATORY_TRACT | Status: DC
  Administered 2021-05-27 – 2021-05-30 (×7): 3 mL via RESPIRATORY_TRACT
  Filled 2021-05-27 (×7): qty 3

## 2021-05-27 MED ORDER — AMLODIPINE BESYLATE 5 MG PO TABS
5.0000 mg | ORAL_TABLET | Freq: Every day | ORAL | Status: DC
Start: 2021-05-27 — End: 2021-05-27

## 2021-05-27 MED ORDER — SODIUM CHLORIDE 0.9 % IV SOLN: 1.0000 g | Freq: Every day | INTRAVENOUS | Status: DC

## 2021-05-27 MED ORDER — INSULIN ASPART 100 UNIT/ML IJ SOLN
0.0000 [IU] | Freq: Every day | INTRAMUSCULAR | Status: DC
  Administered 2021-05-28: 2 [IU] via SUBCUTANEOUS

## 2021-05-27 NOTE — Assessment & Plan Note (Addendum)
-   Blood cultures positive for Acinetobacter, urine culture showed no growth.  Urine strep antigen negative. -Continue IV Unasyn.  ID following.

## 2021-05-27 NOTE — Plan of Care (Signed)
  Problem: Education: Goal: Knowledge of General Education information will improve Description: Including pain rating scale, medication(s)/side effects and non-pharmacologic comfort measures Outcome: Progressing   Problem: Health Behavior/Discharge Planning: Goal: Ability to manage health-related needs will improve Outcome: Progressing   Problem: Clinical Measurements: Goal: Ability to maintain clinical measurements within normal limits will improve Outcome: Progressing Goal: Will remain free from infection Outcome: Progressing Goal: Diagnostic test results will improve Outcome: Progressing Goal: Respiratory complications will improve Outcome: Progressing Goal: Cardiovascular complication will be avoided Outcome: Progressing   Problem: Activity: Goal: Risk for activity intolerance will decrease Outcome: Progressing   Problem: Nutrition: Goal: Adequate nutrition will be maintained Outcome: Progressing   Problem: Coping: Goal: Level of anxiety will decrease Outcome: Progressing   Problem: Elimination: Goal: Will not experience complications related to bowel motility Outcome: Progressing Goal: Will not experience complications related to urinary retention Outcome: Progressing   Problem: Pain Managment: Goal: General experience of comfort will improve Outcome: Progressing   Problem: Safety: Goal: Ability to remain free from injury will improve Outcome: Progressing   Problem: Skin Integrity: Goal: Risk for impaired skin integrity will decrease Outcome: Progressing   Problem: Activity: Goal: Ability to tolerate increased activity will improve Outcome: Progressing   Problem: Clinical Measurements: Goal: Ability to maintain a body temperature in the normal range will improve Outcome: Progressing   Problem: Respiratory: Goal: Ability to maintain adequate ventilation will improve Outcome: Progressing Goal: Ability to maintain a clear airway will improve Outcome:  Progressing   Problem: Education: Goal: Knowledge of disease or condition will improve Outcome: Progressing Goal: Knowledge of the prescribed therapeutic regimen will improve Outcome: Progressing Goal: Individualized Educational Video(s) Outcome: Progressing   Problem: Activity: Goal: Ability to tolerate increased activity will improve Outcome: Progressing Goal: Will verbalize the importance of balancing activity with adequate rest periods Outcome: Progressing   Problem: Respiratory: Goal: Ability to maintain a clear airway will improve Outcome: Progressing Goal: Levels of oxygenation will improve Outcome: Progressing Goal: Ability to maintain adequate ventilation will improve Outcome: Progressing   

## 2021-05-27 NOTE — Social Work (Signed)
CSW met with pt to address consult for homeless resources and SU resources. CSW discussed with pt if Charles [name] had family member that Charles [name] may could go upon dc. Pt explained that his niece may let him stay with her. CSW inquired about sister however Charles [name] stated that was not an option. Pt asked CSW to call niece.  CSW discussed the SU resources. Pt explained that Charles [name] has gone to [facility] before however could not return. CSW mentioned the [facility] and Charles [name] stated that Charles [name] was not appropriate because of his medical  concerns. CSW provided homeless resources as well SU resources.  Pt explained that Charles [name] had a benefits card through [facility] however Charles [name] has not be able to access the funds. CSW attempted to speak with representative with pt present however the wait was 58 minutes. CSW encouraged pt to call back on the hospital phone.  Pt mentioned trying to get into a rooming house as well.  CSW spoke with niece Charles [name] and she explained that she is undecided if pt can come stay with her upon dc. She informed CSW that she is pursing other housing options for pt as well. CSW explained that we could follow up with her upon dc to receive an update.

## 2021-05-27 NOTE — Assessment & Plan Note (Addendum)
-   Likely due to IV steroids, hemoglobin A1c 6.1 -Continue sliding scale insulin  Recent Labs    05/27/21 1127 05/27/21 1517 05/27/21 2108 05/28/21 0600 05/28/21 1138  GLUCAP 175* 258* 176* 128* 113*

## 2021-05-27 NOTE — Progress Notes (Signed)
PHARMACY - PHYSICIAN COMMUNICATION CRITICAL VALUE ALERT - BLOOD CULTURE IDENTIFICATION (BCID)  Charles [name] is an 51 y.o. male who presented to [facility] on 05/26/2021 with a chief complaint of acute respiratory failure  Assessment: Blood with GNR, rapid culture didn't detect organism  Name of physician (or Provider) Contacted: Dr. Leafy Half  Current antibiotics: Ceftriaxone/Azithromycin  Changes to prescribed antibiotics recommended:  Will change Ceftriaxone to Cefepime 2g IV q8h Cont Azithromycin  Results for orders placed or performed during the hospital encounter of 05/26/21  Blood Culture ID Panel (Reflexed) (Collected: 05/26/2021  5:14 AM)  Result Value Ref Range   Enterococcus faecalis NOT DETECTED NOT DETECTED   Enterococcus Faecium NOT DETECTED NOT DETECTED   Listeria monocytogenes NOT DETECTED NOT DETECTED   Staphylococcus species NOT DETECTED NOT DETECTED   Staphylococcus aureus (BCID) NOT DETECTED NOT DETECTED   Staphylococcus epidermidis NOT DETECTED NOT DETECTED   Staphylococcus lugdunensis NOT DETECTED NOT DETECTED   Streptococcus species NOT DETECTED NOT DETECTED   Streptococcus agalactiae NOT DETECTED NOT DETECTED   Streptococcus pneumoniae NOT DETECTED NOT DETECTED   Streptococcus pyogenes NOT DETECTED NOT DETECTED   A.calcoaceticus-baumannii NOT DETECTED NOT DETECTED   Bacteroides fragilis NOT DETECTED NOT DETECTED   Enterobacterales DETECTED (A) NOT DETECTED   Enterobacter cloacae complex NOT DETECTED NOT DETECTED   Escherichia coli NOT DETECTED NOT DETECTED   Klebsiella aerogenes NOT DETECTED NOT DETECTED   Klebsiella oxytoca NOT DETECTED NOT DETECTED   Klebsiella pneumoniae NOT DETECTED NOT DETECTED   Proteus species NOT DETECTED NOT DETECTED   Salmonella species NOT DETECTED NOT DETECTED   Serratia marcescens NOT DETECTED NOT DETECTED   Haemophilus influenzae NOT DETECTED NOT DETECTED   Neisseria meningitidis NOT DETECTED NOT DETECTED   Pseudomonas  aeruginosa NOT DETECTED NOT DETECTED   Stenotrophomonas maltophilia NOT DETECTED NOT DETECTED   Candida albicans NOT DETECTED NOT DETECTED   Candida auris NOT DETECTED NOT DETECTED   Candida glabrata NOT DETECTED NOT DETECTED   Candida krusei NOT DETECTED NOT DETECTED   Candida parapsilosis NOT DETECTED NOT DETECTED   Candida tropicalis NOT DETECTED NOT DETECTED   Cryptococcus neoformans/gattii NOT DETECTED NOT DETECTED   CTX-M ESBL NOT DETECTED NOT DETECTED   Carbapenem resistance IMP NOT DETECTED NOT DETECTED   Carbapenem resistance KPC NOT DETECTED NOT DETECTED   Carbapenem resistance NDM NOT DETECTED NOT DETECTED   Carbapenem resist OXA 48 LIKE NOT DETECTED NOT DETECTED   Carbapenem resistance VIM NOT DETECTED NOT DETECTED    [name] 05/27/2021  1:49 AM

## 2021-05-27 NOTE — Assessment & Plan Note (Addendum)
-   Chest x-ray concerning for severe Multilobar bronchopneumonia right greater than left, mild to moderate cardiomegaly.  Influenza, COVID-19 negative. -Blood cultures positive for Acinetobacter, -ID consulted.  Antibiotics switched cefepime to Unasyn, follow sensitivities.  Recommended to complete Zithromax course for 5 days to finish on 1/18.

## 2021-05-27 NOTE — Progress Notes (Signed)
Patient has no signs of respiratory distress at this time. Will continue to monitor patient and provide bipap as needed.

## 2021-05-27 NOTE — Assessment & Plan Note (Addendum)
-  Still wheezing, continue IV Solu-Medrol, scheduled nebs, antibiotics -Counseled on smoking cessation

## 2021-05-27 NOTE — Assessment & Plan Note (Signed)
-   TOC consult placed

## 2021-05-27 NOTE — Assessment & Plan Note (Addendum)
-   UDS positive for cocaine, counseled on substance abuse

## 2021-05-27 NOTE — Hospital Course (Addendum)
Patient is a 51 year old male with COPD, hypertension, anemia, chronic thrombocytopenia, polysubstance abuse, homeless, presented with progressive worsening shortness of breath over the last 2 weeks.  Patient was placed on BiPAP in ED.  He also reported productive cough with brown/blackish sputum, wheezing, chills, chest pain, nausea, vomiting and lower extremity swelling.  Current smoker, admits to cocaine use.  Has been sleeping on the streets.  In ED, pulse 87-105, respiration 18-30, BP elevated 189/138, O2 sats maintained on BiPAP.  Creatinine 1.2, BNP 802, lactic acid 1.4. Chest x-ray showed severe multilobar bronchopneumonia right greater than left, mild to moderate cardiomegaly  Patient admitted for acute respiratory failure with hypoxia secondary to multilobar pneumonia, acute on chronic diastolic CHF, hypertensive urgency, COPD exacerbation.  1/16: 2D echo showed EF of 30 to 35%, grade 1 DD (down from 60 to 65% in 05/2020), cardiology consulted. Blood cultures + Acinetobacter, ID consulted

## 2021-05-27 NOTE — Assessment & Plan Note (Signed)
-   Chronic anemia with thrombocytopenia.  Baseline hemoglobin around 12, platelet counts have been improving -Monitor counts, currently stable

## 2021-05-27 NOTE — Progress Notes (Signed)
Mobility Specialist Progress Note    05/27/21 1725  Mobility  Activity Ambulated in hall  Level of Assistance Standby assist, set-up cues, supervision of patient - no hands on  Assistive Device None  Distance Ambulated (ft) 410 ft  Mobility Ambulated independently in hallway  Mobility Response Tolerated fair  Mobility performed by Mobility specialist  $Mobility charge 1 Mobility   During Mobility: 106 HR, 94% SpO2 Post-Mobility: 100 HR, 93% SpO2  Pt received in bed and agreeable. Pt noticeably SOB with exertion but SpO2 remained >/= 90% on RA. Returned to bed with call bell in reach.   [facility] Mobility Specialist  M.S. 2C and 6E: [phone] M.S. 4E: [phone]

## 2021-05-27 NOTE — Progress Notes (Addendum)
Progress Note   Patient: Charles [name] [id number] DOB: 03-Mar-1971 DOA: 05/26/2021     1 DOS: the patient was seen and examined on 05/27/2021   Brief hospital course: Patient is a 51 year old male with COPD, hypertension, anemia, chronic thrombocytopenia, polysubstance abuse, homeless, presented with progressive worsening shortness of breath over the last 2 weeks.  Patient was placed on BiPAP in ED.  He also reported productive cough with brown/blackish sputum, wheezing, chills, chest pain, nausea, vomiting and lower extremity swelling.  Current smoker, admits to cocaine use.  Has been sleeping on the streets.  In ED, pulse 87-105, respiration 18-30, BP elevated 189/138, O2 sats maintained on BiPAP.  Creatinine 1.2, BNP 802, lactic acid 1.4. Chest x-ray showed severe multilobar bronchopneumonia right greater than left, mild to moderate cardiomegaly  Patient admitted for acute respiratory failure with hypoxia secondary to multilobar pneumonia, acute on chronic diastolic CHF, hypertensive urgency, COPD exacerbation.  Assessment and Plan * Acute respiratory failure with hypoxia (HCC)- (present on admission) - Secondary to multifocal pneumonia, COPD exacerbation,  acute on chronic diastolic CHF - Initially presented with hypoxia and was placed on BiPAP  -BiPAP currently off, O2 sats 89% on room air -Continue O2 as tolerated, IV antibiotics, steroids, nebs  Bacteremia due to Enterobacter species- (present on admission) - BC ID positive for Enterobacter/gram-negative rods,  -UA negative for UTI, follow urine strep antigen, urine Legionella antigen, obtain sputum culture -Continue IV Rocephin, will adjust antibiotics according to sensitivities  Multifocal pneumonia- (present on admission) - Chest x-ray concerning for severe Multi lobar bronchopneumonia right greater than left, mild to moderate cardiomegaly.  Influenza, COVID-19 negative. -BC ID positive for Enterobacter/gram-negative rods,  procalcitonin 0.45 -Continue IV Zithromax, Rocephin, adjust antibiotics according to sensitivities  Acute on chronic diastolic CHF (congestive heart failure) ([place])- (present on admission) - Patient presented with hypoxia, elevated BNP 802, cardiomegaly on chest x-ray -Repeat 2D echo, received Lasix 40 mg x2 on admission,  -O2 sats improving, negative balance of 3.6 L, good output. -Continue Lasix 40 mg IV daily, strict I's and O's and daily weights  COPD with acute exacerbation (HCC)- (present on admission) - Wheezing improving, continue duo nebs scheduled, Solu-Medrol 80 mg daily -Counseled on smoking cessation  Hypertensive urgency- (present on admission) - Noncompliant with medications, UDS positive for cocaine -Resume Norvasc 10 mg daily, Avapro 75 mg daily, continue IV Lasix 40 mg daily -Patient counseled on compliance  Homelessness - TOC consult placed  Polysubstance abuse ([place])- (present on admission) - UDS positive for cocaine, counseled on substance abuse  Thrombocytopenia ([place])- (present on admission) - Chronic anemia with thrombocytopenia.  Baseline hemoglobin around 12, platelet counts have been improving -Monitor counts, currently stable  Hyperglycemia- (present on admission) - Likely due to IV steroids, obtain hemoglobin A1c, placed on sensitive sliding scale insulin -Follow CBGs before every meal and at bedtime     Subjective: Off BiPAP now, feeling better.  Afebrile.  Sats 89% on room air.  Wheezing is improving as well.  Objective Vitals:   05/27/21 0730 05/27/21 0937  BP: (!) 141/82   Pulse: 98   Resp: 20   Temp: 98.2 F (36.8 C)   SpO2: 95% 99%    Physical Exam General: Alert and oriented x 3, NAD Cardiovascular: S1 S2 clear, RRR.  1+ pedal edema b/l Respiratory: Diminished breath sound at the bases, bilateral scattered wheezing, improving Gastrointestinal: Soft, nontender, nondistended, NBS Ext: 1+ pedal edema bilaterally Neuro: no new  deficits Psych: Normal affect and demeanor,  alert and oriented x3    Data Reviewed: CBC Latest Ref Rng & Units 05/27/2021 05/26/2021 05/26/2021  WBC 4.0 - 10.5 K/uL 10.0 - 5.0  Hemoglobin 13.0 - 17.0 g/dL 12.5(L) 13.3 13.5  Hematocrit 39.0 - 52.0 % 36.1(L) 39.0 40.9  Platelets 150 - 400 K/uL 106(L) - 102(L)    BMP Latest Ref Rng & Units 05/27/2021 05/26/2021 05/26/2021  Glucose 70 - 99 mg/dL 136(H) - 248(H)  BUN 6 - 20 mg/dL 25(H) - 13  Creatinine 0.61 - 1.24 mg/dL 1.15 - 1.24  Sodium 135 - 145 mmol/L 134(L) 144 139  Potassium 3.5 - 5.1 mmol/L 4.8 4.3 4.3  Chloride 98 - 111 mmol/L 102 - 109  CO2 22 - 32 mmol/L 20(L) - 21(L)  Calcium 8.9 - 10.3 mg/dL 9.0 - 8.5(L)      EKG: Rate 109, sinus tachycardia, severe LVH Chest x-ray: Severe multilobar bilateral bronchopneumonia right greater than left, mild to moderate cardiomegaly   Family Communication: Discussed with patient  Disposition: Status is: Inpatient  Remains inpatient appropriate because: Severe multilobar pneumonia, gram-negative rod bacteremia, COPD exacerbation, hypoxia needs IV antibiotics, scheduled nebs, steroids  DVT prophylaxis: SCDs     Time spent: 45 minutes  Author: Estill Cotta, MD 05/27/2021 11:01 AM  For on call review [email or web address].

## 2021-05-27 NOTE — Assessment & Plan Note (Addendum)
-   Secondary to multifocal pneumonia, COPD exacerbation,  acute on chronic diastolic CHF - Initially presented with hypoxia and was placed on BiPAP, currently off BiPAP, O2 sats stable 100% on room air -Wheezing bilaterally, placed on scheduled nebs, continue IV Solu-Medrol, antibiotics, flutter valve, IS

## 2021-05-27 NOTE — Assessment & Plan Note (Addendum)
-   Noncompliant with medications, UDS positive for cocaine -Continue Avapro, increase hydralazine to 50 mg every 8 hours, Lasix 40 mg IV daily.  Amlodipine discontinued -Cardiology following, added Farxiga, will need Entresto, BiDil, -Not on beta-blockers due to cocaine use

## 2021-05-27 NOTE — Assessment & Plan Note (Addendum)
-   Patient presented with hypoxia, elevated BNP 802, cardiomegaly on chest x-ray -2D echo showed EF of 30 to 35%, G1 DD, global hypokinesis.  (EF was 60 to 65% per echo in 05/2020), will consult cardiology -Continue Lasix 40 mg IV daily, cardiology consulted, recommended cardiac cath, cardiac MRI.  Continue current management -Negative balance of 8.1 L,

## 2021-05-28 ENCOUNTER — Inpatient Hospital Stay (HOSPITAL_COMMUNITY): Payer: Medicare Other

## 2021-05-28 DIAGNOSIS — R7881 Bacteremia: Secondary | ICD-10-CM

## 2021-05-28 DIAGNOSIS — J181 Lobar pneumonia, unspecified organism: Secondary | ICD-10-CM | POA: Diagnosis not present

## 2021-05-28 DIAGNOSIS — I517 Cardiomegaly: Secondary | ICD-10-CM

## 2021-05-28 LAB — URINE CULTURE: Culture: NO GROWTH

## 2021-05-28 LAB — ECHOCARDIOGRAM COMPLETE
AR max vel: 3.17 cm2
AV Peak grad: 8.2 mmHg
Ao pk vel: 1.43 m/s
Area-P 1/2: 3.99 cm2
Height: 66 in
S' Lateral: 4 cm
Single Plane A4C EF: 31.8 %
Weight: 2479.73 [oz_av]

## 2021-05-28 LAB — RESPIRATORY PANEL BY PCR

## 2021-05-28 LAB — BASIC METABOLIC PANEL
Anion gap: 7 (ref 5–15)
Anion gap: 9 (ref 5–15)
BUN: 23 mg/dL — ABNORMAL HIGH (ref 6–20)
BUN: 18 mg/dL (ref 6–20)
CO2: 26 mmol/L (ref 22–32)
CO2: 26 mmol/L (ref 22–32)
Calcium: 8.9 mg/dL (ref 8.9–10.3)
Calcium: 9 mg/dL (ref 8.9–10.3)
Chloride: 101 mmol/L (ref 98–111)
Chloride: 102 mmol/L (ref 98–111)
Creatinine, Ser: 1.13 mg/dL (ref 0.61–1.24)
Creatinine, Ser: 1.13 mg/dL (ref 0.61–1.24)
GFR, Estimated: >60 mL/min (ref >60)
GFR, Estimated: >60 mL/min (ref >60)
Glucose, Bld: 165 mg/dL — ABNORMAL HIGH (ref 70–99)
Glucose, Bld: 141 mg/dL — ABNORMAL HIGH (ref 70–99)
Potassium: 4.2 mmol/L (ref 3.5–5.1)
Potassium: 4 mmol/L (ref 3.5–5.1)
Sodium: 134 mmol/L — ABNORMAL LOW (ref 135–145)
Sodium: 137 mmol/L (ref 135–145)

## 2021-05-28 LAB — CBC
HCT: 37.1 % — ABNORMAL LOW (ref 39.0–52.0)
Hemoglobin: 12.3 g/dL — ABNORMAL LOW (ref 13.0–17.0)
MCH: 35.4 pg — ABNORMAL HIGH (ref 26.0–34.0)
MCHC: 33.2 g/dL (ref 30.0–36.0)
MCV: 106.9 fL — ABNORMAL HIGH (ref 80.0–100.0)
Platelets: 101 10*3/uL — ABNORMAL LOW (ref 150–400)
RBC: 3.47 MIL/uL — ABNORMAL LOW (ref 4.22–5.81)
RDW: 18 % — ABNORMAL HIGH (ref 11.5–15.5)
WBC: 11.9 10*3/uL — ABNORMAL HIGH (ref 4.0–10.5)
nRBC: 0 % (ref 0.0–0.2)

## 2021-05-28 LAB — GLUCOSE, CAPILLARY
Glucose-Capillary: 128 mg/dL — ABNORMAL HIGH (ref 70–99)
Glucose-Capillary: 113 mg/dL — ABNORMAL HIGH (ref 70–99)
Glucose-Capillary: 138 mg/dL — ABNORMAL HIGH (ref 70–99)
Glucose-Capillary: 206 mg/dL — ABNORMAL HIGH (ref 70–99)

## 2021-05-28 LAB — PROTEIN / CREATININE RATIO, URINE
Creatinine, Urine: 144.29 mg/dL
Protein Creatinine Ratio: 0.17 mg/mg{Cre} — ABNORMAL HIGH (ref 0.00–0.15)
Total Protein, Urine: 25 mg/dL

## 2021-05-28 LAB — RAPID URINE DRUG SCREEN, HOSP PERFORMED
Amphetamines: NOT DETECTED
Barbiturates: NOT DETECTED
Benzodiazepines: NOT DETECTED
Cocaine: POSITIVE — AB
Opiates: NOT DETECTED
Tetrahydrocannabinol: NOT DETECTED

## 2021-05-28 MED ORDER — HYDRALAZINE HCL 10 MG PO TABS
10.0000 mg | ORAL_TABLET | Freq: Three times a day (TID) | ORAL | Status: DC
  Administered 2021-05-28 – 2021-05-29 (×3): 10 mg via ORAL
  Filled 2021-05-28 (×3): qty 1

## 2021-05-28 MED ORDER — IRBESARTAN 150 MG PO TABS
75.0000 mg | ORAL_TABLET | Freq: Once | ORAL | Status: AC
  Administered 2021-05-28: 75 mg via ORAL
  Filled 2021-05-28: qty 1

## 2021-05-28 MED ORDER — IRBESARTAN 150 MG PO TABS
150.0000 mg | ORAL_TABLET | Freq: Every day | ORAL | Status: DC
  Administered 2021-05-29: 150 mg via ORAL
  Filled 2021-05-28: qty 1

## 2021-05-28 MED ORDER — AZITHROMYCIN 500 MG PO TABS
250.0000 mg | ORAL_TABLET | Freq: Every day | ORAL | Status: AC
  Administered 2021-05-29 – 2021-05-30 (×2): 250 mg via ORAL
  Filled 2021-05-28 (×2): qty 1

## 2021-05-28 MED ORDER — DAPAGLIFLOZIN PROPANEDIOL 10 MG PO TABS
10.0000 mg | ORAL_TABLET | Freq: Every day | ORAL | Status: DC
  Administered 2021-05-29 – 2021-06-01 (×4): 10 mg via ORAL
  Filled 2021-05-28 (×5): qty 1

## 2021-05-28 MED ORDER — SODIUM CHLORIDE 0.9 % IV SOLN
3.0000 g | Freq: Four times a day (QID) | INTRAVENOUS | Status: DC
  Administered 2021-05-28 – 2021-06-03 (×22): 3 g via INTRAVENOUS
  Filled 2021-05-28 (×25): qty 8

## 2021-05-28 MED ORDER — METHYLPREDNISOLONE SODIUM SUCC 40 MG IJ SOLR
40.0000 mg | Freq: Every day | INTRAMUSCULAR | Status: DC
  Administered 2021-05-29 – 2021-05-30 (×2): 40 mg via INTRAVENOUS
  Filled 2021-05-28 (×2): qty 1

## 2021-05-28 NOTE — Progress Notes (Signed)
Echocardiogram 2D Echocardiogram has been performed.  Charles [name] 05/28/2021, 8:56 AM

## 2021-05-28 NOTE — TOC Initial Note (Signed)
Transition of Care [facility]) - Initial/Assessment Note    Patient Details  Name: Charles [name] MRN: [id number] Date of Birth: 03-03-1971  Transition of Care [facility] CM/SW Contact:    Beckie Busing, RN Phone Number:[phone]  05/28/2021, 2:18 PM  Clinical Narrative:                 [facility] Ms consulted for patient with high risk for readmission and medication assistance. Cm at bedside to assess patient. CM made patient aware that he has UHC and medicaid therefore TOC will not be able to assist with medication assistance. Patient verbalized understanding that he knows that he has a medicaid card but he has not activated the card because he has to stay on hold too long. Patient verbalized desire for someone to come to his room to help him by waiting on hold for him. CM has advised patient to call early morning to cut the wait time on the phones. CM has made patient aware that he will need to activate his benefits card. Patient verbalized understanding.    Patient reports that he does have a PCP although no PCP is listed on the chart. Patient states that he can not verbalize the name orr the group for which his PCP works. Patient states that he has no DME because he was completely independent prior to admission. Currently no TOC needs. TOC  will continue to follow for any disposition needs.      Barriers to Discharge: Continued Medical Work up   Patient Goals and CMS Choice Patient states their goals for this hospitalization and ongoing recovery are:: "Ready to get out of here" CMS Medicare.gov Compare Post Acute Care list provided to::  (n/a) Choice offered to / list presented to : NA  Expected Discharge Plan and Services   In-house Referral: NA Discharge Planning Services: CM Consult Post Acute Care Choice: NA Living arrangements for the past 2 months: Homeless                 DME Arranged: N/A DME Agency: NA       HH Arranged: NA HH Agency: NA        Prior Living  Arrangements/Services Living arrangements for the past 2 months: Homeless Lives with:: Other (Comment) (homeless) Patient language and need for interpreter reviewed:: Yes Do you feel safe going back to the place where you live?: Yes      Need for Family Participation in Patient Care: No (Comment) Care giver support system in place?: No (comment) Current home services: Other (comment) (n/a) Criminal Activity/Legal Involvement Pertinent to Current Situation/Hospitalization: No - Comment as needed  Activities of Daily Living      Permission Sought/Granted   Permission granted to share information with : No              Emotional Assessment Appearance:: Appears older than stated age Attitude/Demeanor/Rapport: Gracious Affect (typically observed): Pleasant Orientation: : Oriented to Self, Oriented to Place, Oriented to  Time, Oriented to Situation Alcohol / Substance Use: Other (comment) (polysubstance abuse) Psych Involvement: No (comment)  Admission diagnosis:  Acute respiratory failure with hypoxia (HCC) [J96.01] Multifocal pneumonia [J18.9] Patient Active Problem List   Diagnosis Date Noted   Bacteremia due to Enterobacter species 05/27/2021   Acute on chronic systolic and diastolic CHF (congestive heart failure) (HCC) 05/27/2021   Hyperglycemia 05/27/2021   Acute respiratory failure with hypoxia (HCC) 05/26/2021   Hypertensive urgency 05/26/2021   Polysubstance abuse (HCC) 05/26/2021   COPD with  acute exacerbation (HCC) 05/26/2021   Ischemic chest pain (HCC) 05/26/2021   Hepatitis C 05/26/2021   Homelessness 05/26/2021   Encephalopathy acute 06/06/2020   Seizure (HCC) 06/06/2020   Aspiration pneumonia (HCC) 06/22/2019   Acute respiratory failure with hypoxia and hypercapnia (HCC) 06/22/2019   Hypokalemia 06/22/2019   Pneumonia due to COVID-19 virus 06/22/2019   Splenic laceration, initial encounter 08/02/2016   Thrombocytopenia (HCC) 10/19/2014   Lobar pneumonia  due to unspecified organism 10/19/2014   Tobacco use disorder 10/19/2014   AKI (acute kidney injury) (HCC) 10/19/2014   Bilateral pneumonia 10/17/2014   Multifocal pneumonia 10/17/2014   PCP:  Patient, No Pcp Per (Inactive) Pharmacy:   [facility] [address] [place] [address] Phone: [phone] Fax: [phone]     Social Determinants of Health (SDOH) Interventions    Readmission Risk Interventions Readmission Risk Prevention Plan 05/28/2021  Transportation Screening Complete  PCP or Specialist Appt within 3-5 Days Complete  HRI or Home Care Consult Complete  Social Work Consult for Recovery Care Planning/Counseling Complete  Palliative Care Screening Not Applicable  Medication Review Oceanographer) Referral to Pharmacy  Some recent data might be hidden

## 2021-05-28 NOTE — Progress Notes (Signed)
Progress Note   Patient: Charles [name] [id number] DOB: 02-Oct-1970 DOA: 05/26/2021     2 DOS: the patient was seen and examined on 05/28/2021   Brief hospital course: Patient is a 51 year old male with COPD, hypertension, anemia, chronic thrombocytopenia, polysubstance abuse, homeless, presented with progressive worsening shortness of breath over the last 2 weeks.  Patient was placed on BiPAP in ED.  He also reported productive cough with brown/blackish sputum, wheezing, chills, chest pain, nausea, vomiting and lower extremity swelling.  Current smoker, admits to cocaine use.  Has been sleeping on the streets.  In ED, pulse 87-105, respiration 18-30, BP elevated 189/138, O2 sats maintained on BiPAP.  Creatinine 1.2, BNP 802, lactic acid 1.4. Chest x-ray showed severe multilobar bronchopneumonia right greater than left, mild to moderate cardiomegaly  Patient admitted for acute respiratory failure with hypoxia secondary to multilobar pneumonia, acute on chronic diastolic CHF, hypertensive urgency, COPD exacerbation.  1/16: 2D echo showed EF of 30 to 35%, grade 1 DD (down from 60 to 65% in 05/2020), will consult cardiology  Assessment and Plan * Acute respiratory failure with hypoxia ([place])- (present on admission) - Secondary to multifocal pneumonia, COPD exacerbation,  acute on chronic diastolic CHF - Initially presented with hypoxia and was placed on BiPAP  -BiPAP, currently off O2, O2 sats 100% -Currently on room air, continue IV cefepime, Zithromax, steroids, nebs  Bacteremia due to Enterobacter species- (present on admission) - Blood cultures positive for Acinetobacter, repeat blood blood cultures today.  Urine culture showed no growth -Urine strep antigen negative -Continue IV cefepime  Multifocal pneumonia- (present on admission) - Chest x-ray concerning for severe Multilobar bronchopneumonia right greater than left, mild to moderate cardiomegaly.  Influenza, COVID-19  negative. -Blood cultures positive for Acinetobacter, follow sensitivities -for now continue IV Zithromax, cefepime  Acute on chronic systolic and diastolic CHF (congestive heart failure) ([place])- (present on admission) - Patient presented with hypoxia, elevated BNP 802, cardiomegaly on chest x-ray -2D echo showed EF of 30 to 35%, G1 DD, global hypokinesis.  (EF was 60 to 65% per echo in 05/2020), will consult cardiology -O2 sats improving, negative balance of 7.2 L, continue Lasix 40 mg IV daily -Strict I's and O's and daily weights  COPD with acute exacerbation ([place])- (present on admission) - Wheezing improved, Solu-Medrol tapered to 40 mg daily, continue duo nebs  -Counseled on smoking cessation  Hypertensive urgency- (present on admission) - Noncompliant with medications, UDS positive for cocaine -Continue Norvasc 10 mg daily, Lasix 40 mg IV daily -BP still elevated, add hydralazine 10 mg 3 times daily, increase Avapro to 150 mg daily  Homelessness - TOC consult placed  Polysubstance abuse ([place])- (present on admission) - UDS positive for cocaine, counseled on substance abuse  Thrombocytopenia ([place])- (present on admission) - Chronic anemia with thrombocytopenia.  Baseline hemoglobin around 12, platelet counts have been improving -Monitor counts, currently stable  Hyperglycemia- (present on admission) - Likely due to IV steroids, hemoglobin A1c 6.1 -Continue sliding scale insulin  Recent Labs    05/27/21 1127 05/27/21 1517 05/27/21 2108 05/28/21 0600 05/28/21 1138  GLUCAP 175* 258* 176* 128* 113*     Subjective: Shortness of breath is significantly improving, off O2 now.  No chest pain.  No fevers  Objective Vitals:   05/28/21 0918 05/28/21 1137  BP: (!) 168/105 (!) 147/100  Pulse:    Resp:  19  Temp:  98.1 F (36.7 C)  SpO2:       Physical Exam General: Alert  and oriented x 3, NAD Cardiovascular: S1 S2 clear, RRR.  Trace pedal edema b/l Respiratory:  Diminished breath sound at the bases, no wheezing Gastrointestinal: Soft, nontender, nondistended, NBS Ext: trace pedal edema bilaterally Psych: Normal affect and demeanor, alert and oriented x3    Data Reviewed:  BMP Latest Ref Rng & Units 05/28/2021 05/28/2021 05/27/2021  Glucose 70 - 99 mg/dL 141(H) 165(H) 136(H)  BUN 6 - 20 mg/dL 18 23(H) 25(H)  Creatinine 0.61 - 1.24 mg/dL 1.13 1.13 1.15  Sodium 135 - 145 mmol/L 137 134(L) 134(L)  Potassium 3.5 - 5.1 mmol/L 4.0 4.2 4.8  Chloride 98 - 111 mmol/L 102 101 102  CO2 22 - 32 mmol/L 26 26 20(L)  Calcium 8.9 - 10.3 mg/dL 9.0 8.9 9.0    2D echo showed EF of 30 to 35%, moderately decreased left ventricular function, global hypokinesis, grade 1 DD.  Trivial pericardial effusion.  Family Communication:   Disposition: Status is: Inpatient  Remains inpatient appropriate because: On IV diuresis, depressed EF per echo, cardiology to be consulted    Time spent: 67mins   Author: Estill Cotta, MD 05/28/2021 1:06 PM  For on call review [email or web address].

## 2021-05-28 NOTE — Progress Notes (Signed)
Mobility Specialist Progress Note    05/28/21 1135  Mobility  Activity Ambulated in hall  Level of Assistance Modified independent, requires aide device or extra time  Assistive Device  (IV pole)  Distance Ambulated (ft) 810 ft  Mobility Ambulated independently in hallway  Mobility Response Tolerated fair  Mobility performed by Mobility specialist  Bed Position Chair  $Mobility charge 1 Mobility   Pre-Mobility: 98 HR During Mobility: 104 HR Post-Mobility: 87 HR  Pt received in bed and agreeable. C/o some SOB. Returned to chair with call bell in reach.   [facility] Mobility Specialist  M.S. 2C and 6E: [phone] M.S. 4E: [phone]

## 2021-05-28 NOTE — Consult Note (Signed)
[facility]    Date of Admission:  05/26/2021     Reason for Consult: gram negative bacteremia    Referring Provider: Charise Carwin, Dr Tana [place]   Lines:  Peripheral IV's  Abx: 1/15-c cefepime 1/14-c azith  1/14 ceftriaxone        Assessment: 51 yo male copd, polysubstance abuse (admission uds cocaine), hep c, chronic thrombocytopenia, homeless, admitted with 2 weeks s/s pneumonia, also found to have acinetobacter lwoffii bacteremia  Received steroid as part of tx, with subsequent slight leukocytosis. No fever this admission  1/16 assessment Clinically doing well on room air and normal hd/slightly hypertensive  A nonfermenter bacteremia is rather atypical for a normal host without immunosuppression/catheter. Given his drug use, possibly that being source  HCM -- hiv screen negative. Unclear hep c treatment status or resolution so will repeat testing with hcv rna, along with hep b serology    Plan: Continue azith for full 5 days to finish on 1/18 -- transitioned to oral today Change cefepime to amp/sulbactam for acinetobacter lwoffii coverage (based on hospital susceptibility) for now, pending susceptibility I do not see need to repeat blood cultures at this time given this is gram negative and low suspicion of seeding complication/endocarditis Hcv rna quant, hep b serology for tomorrow labs Will review hep c treatment history with patient Discussed with primary team   I spent 60 minute reviewing data/chart, and coordinating care and >50% direct face to face time providing counseling/discussing diagnostics/treatment plan with patient      ------------------------------------------------ Principal Problem:   Acute respiratory failure with hypoxia ([place]) Active Problems:   Multifocal pneumonia   Thrombocytopenia ([place])   Hypertensive urgency   Polysubstance abuse ([place])   COPD with acute exacerbation ([place])   Homelessness   Bacteremia  due to Enterobacter species   Acute on chronic systolic and diastolic CHF (congestive heart failure) ([place])   Hyperglycemia    HPI: Charles [name] is a 51 y.o. male substance abuse, copd, homeless, admitted for 2 weeks s/s of pna found to have bacteremia  He smokes cocaine, denies ivdu/indu He has right tibial rod from prior fx and mild right knee pain without new sx  He reports 2 weeks of dyspnea on exertion with associated f/c/nausea and increased cough. No rash, headache, sick contact  On presentation no fever, however cxr showed bilateral ggo/lobar consolidation Started on empiric abx with improvement in sx. On room air today Bcx returned with acinetobacter  Chart mentioned hx hep c and hx aplastic anemia/thrombocytopenia    Fam history: No liver disease  Social History   Tobacco Use   Smoking status: Every Day    Packs/day: 0.25    Years: 30.00    Pack years: 7.50    Types: Cigarettes   Smokeless tobacco: Former  Substance Use Topics   Alcohol use: No   Drug use: Yes    Types: Cocaine, Marijuana    Comment: last use 07/05/18    Allergies  Allergen Reactions   Eggs Or Egg-Derived Products Nausea And Vomiting and Swelling   Banana Nausea And Vomiting   Pork-Derived Products Nausea And Vomiting    Review of Systems: ROS All Other ROS was negative, except mentioned above   Past Medical History:  Diagnosis Date   Aplastic anemia ([place])    Arthritis    "left ankle" (10/17/2014)   Bilateral pneumonia 10/17/2014   Hepatitis    "think it was B" (10/17/2014)  History of blood transfusion "several"   "related to aplastic anemia"   Hypertension    Laceration of spleen    s/p embolization   Polysubstance abuse ([place])    cocaine, benzo's, opiates, THC   Sleep apnea    "wore mask in prison; 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" (10/17/2014)       Scheduled Meds:  amLODipine  10 mg Oral Daily   [START ON 05/29/2021] azithromycin  250 mg Oral Daily   furosemide  40 mg Intravenous  Daily   guaiFENesin  600 mg Oral BID   hydrALAZINE  10 mg Oral Q8H   insulin aspart  0-5 Units Subcutaneous QHS   insulin aspart  0-9 Units Subcutaneous TID WC   ipratropium-albuterol  3 mL Nebulization TID   [START ON 05/29/2021] irbesartan  150 mg Oral Daily   [START ON 05/29/2021] methylPREDNISolone (SOLU-MEDROL) injection  40 mg Intravenous Daily   nicotine  21 mg Transdermal Daily   sodium chloride flush  3 mL Intravenous Q12H   Continuous Infusions: PRN Meds:.acetaminophen **OR** acetaminophen, albuterol   OBJECTIVE: Blood pressure (!) 147/129, pulse 89, temperature 98.1 F (36.7 C), resp. rate 19, height 5\' 6"  (1.676 m), weight 70.3 kg, SpO2 100 %.  Physical Exam  General/constitutional: no distress, pleasant HEENT: Normocephalic, PER, Conj Clear, EOMI, Oropharynx clear Neck supple CV: rrr no mrg Lungs: clear to auscultation, normal respiratory effort Abd: Soft, Nontender Ext: no edema Skin: No Rash Neuro: nonfocal MSK: no peripheral joint swelling/tenderness/warmth; back spines nontender   Central line presence: no   Lab Results Lab Results  Component Value Date   WBC 11.9 (H) 05/28/2021   HGB 12.3 (L) 05/28/2021   HCT 37.1 (L) 05/28/2021   MCV 106.9 (H) 05/28/2021   PLT 101 (L) 05/28/2021    Lab Results  Component Value Date   CREATININE 1.13 05/28/2021   BUN 18 05/28/2021   NA 137 05/28/2021   K 4.0 05/28/2021   CL 102 05/28/2021   CO2 26 05/28/2021    Lab Results  Component Value Date   ALT 25 05/27/2021   AST 35 05/27/2021   ALKPHOS 68 05/27/2021   BILITOT 0.9 05/27/2021      Microbiology: Recent Results (from the past 240 hour(s))  Resp Panel by RT-PCR (Flu A&B, Covid) Nasopharyngeal Swab     Status: None   Collection Time: 05/26/21  4:40 AM   Specimen: Nasopharyngeal Swab; Nasopharyngeal(NP) swabs in vial transport medium  Result Value Ref Range Status   SARS Coronavirus 2 by RT PCR NEGATIVE NEGATIVE Final    Comment:  (NOTE) SARS-CoV-2 target nucleic acids are NOT DETECTED.  The SARS-CoV-2 RNA is generally detectable in upper respiratory specimens during the acute phase of infection. The lowest concentration of SARS-CoV-2 viral copies this assay can detect is 138 copies/mL. A negative result does not preclude SARS-Cov-2 infection and should not be used as the sole basis for treatment or other patient management decisions. A negative result may occur with  improper specimen collection/handling, submission of specimen other than nasopharyngeal swab, presence of viral mutation(s) within the areas targeted by this assay, and inadequate number of viral copies(<138 copies/mL). A negative result must be combined with clinical observations, patient history, and epidemiological information. The expected result is Negative.  Fact Sheet for Patients:  EntrepreneurPulse.com.au  Fact Sheet for Healthcare Providers:  IncredibleEmployment.be  This test is no t yet approved or cleared by the [place] FDA and  has been authorized for detection and/or diagnosis of SARS-CoV-2  by FDA under an Emergency Use Authorization (EUA). This EUA will remain  in effect (meaning this test can be used) for the duration of the COVID-19 declaration under Section 564(b)(1) of the Act, 21 U.S.C.section 360bbb-3(b)(1), unless the authorization is terminated  or revoked sooner.       Influenza A by PCR NEGATIVE NEGATIVE Final   Influenza B by PCR NEGATIVE NEGATIVE Final    Comment: (NOTE) The Xpert Xpress SARS-CoV-2/FLU/RSV plus assay is intended as an aid in the diagnosis of influenza from Nasopharyngeal swab specimens and should not be used as a sole basis for treatment. Nasal washings and aspirates are unacceptable for Xpert Xpress SARS-CoV-2/FLU/RSV testing.  Fact Sheet for Patients: EntrepreneurPulse.com.au  Fact Sheet for Healthcare  Providers: IncredibleEmployment.be  This test is not yet approved or cleared by the [place] FDA and has been authorized for detection and/or diagnosis of SARS-CoV-2 by FDA under an Emergency Use Authorization (EUA). This EUA will remain in effect (meaning this test can be used) for the duration of the COVID-19 declaration under Section 564(b)(1) of the Act, 21 U.S.C. section 360bbb-3(b)(1), unless the authorization is terminated or revoked.  Performed at [facility], [place] [address] [place], [place] [address]   Blood Culture (routine x 2)     Status: Abnormal (Preliminary result)   Collection Time: 05/26/21  5:09 AM   Specimen: BLOOD  Result Value Ref Range Status   Specimen Description BLOOD SITE NOT SPECIFIED  Final   Special Requests   Final    BOTTLES DRAWN AEROBIC AND ANAEROBIC Blood Culture adequate volume   Culture  Setup Time   Final    GRAM NEGATIVE RODS IN BOTH AEROBIC AND ANAEROBIC BOTTLES CRITICAL VALUE NOTED.  VALUE IS CONSISTENT WITH PREVIOUSLY REPORTED AND CALLED VALUE.    Culture (A)  Final    ACINETOBACTER LWOFFII SUSCEPTIBILITIES TO FOLLOW CULTURE REINCUBATED FOR BETTER GROWTH Performed at [facility], [place] [address] [place], [place] [address]    Report Status PENDING  Incomplete  Blood Culture (routine x 2)     Status: Abnormal (Preliminary result)   Collection Time: 05/26/21  5:14 AM   Specimen: BLOOD  Result Value Ref Range Status   Specimen Description BLOOD SITE NOT SPECIFIED  Final   Special Requests   Final    BOTTLES DRAWN AEROBIC AND ANAEROBIC Blood Culture adequate volume   Culture  Setup Time   Final    GRAM NEGATIVE RODS AEROBIC BOTTLE ONLY CRITICAL RESULT CALLED TO, READ BACK BY AND VERIFIED WITH: PHARMD JAMES LEDFORD 05/27/21@1 :15 BY TW    Culture (A)  Final    ACINETOBACTER LWOFFII CULTURE REINCUBATED FOR BETTER GROWTH Performed at [facility], [place] [address] [place], [place] [address]     Report Status PENDING  Incomplete  Blood Culture ID Panel (Reflexed)     Status: Abnormal   Collection Time: 05/26/21  5:14 AM  Result Value Ref Range Status   Enterococcus faecalis NOT DETECTED NOT DETECTED Final   Enterococcus Faecium NOT DETECTED NOT DETECTED Final   Listeria monocytogenes NOT DETECTED NOT DETECTED Final   Staphylococcus species NOT DETECTED NOT DETECTED Final   Staphylococcus aureus (BCID) NOT DETECTED NOT DETECTED Final   Staphylococcus epidermidis NOT DETECTED NOT DETECTED Final   Staphylococcus lugdunensis NOT DETECTED NOT DETECTED Final   Streptococcus species NOT DETECTED NOT DETECTED Final   Streptococcus agalactiae NOT DETECTED NOT DETECTED Final   Streptococcus pneumoniae NOT DETECTED NOT DETECTED Final  Streptococcus pyogenes NOT DETECTED NOT DETECTED Final   A.calcoaceticus-baumannii NOT DETECTED NOT DETECTED Final   Bacteroides fragilis NOT DETECTED NOT DETECTED Final   Enterobacterales DETECTED (A) NOT DETECTED Final    Comment: Enterobacterales represent a large order of gram negative bacteria, not a single organism. Refer to culture for further identification. CRITICAL RESULT CALLED TO, READ BACK BY AND VERIFIED WITH: PHARMD JAMES LEDFORD 05/27/21@1 :15 BY TW    Enterobacter cloacae complex NOT DETECTED NOT DETECTED Final   Escherichia coli NOT DETECTED NOT DETECTED Final   Klebsiella aerogenes NOT DETECTED NOT DETECTED Final   Klebsiella oxytoca NOT DETECTED NOT DETECTED Final   Klebsiella pneumoniae NOT DETECTED NOT DETECTED Final   Proteus species NOT DETECTED NOT DETECTED Final   Salmonella species NOT DETECTED NOT DETECTED Final   Serratia marcescens NOT DETECTED NOT DETECTED Final   Haemophilus influenzae NOT DETECTED NOT DETECTED Final   Neisseria meningitidis NOT DETECTED NOT DETECTED Final   Pseudomonas aeruginosa NOT DETECTED NOT DETECTED Final   Stenotrophomonas maltophilia NOT DETECTED NOT DETECTED Final   Candida albicans NOT DETECTED  NOT DETECTED Final   Candida auris NOT DETECTED NOT DETECTED Final   Candida glabrata NOT DETECTED NOT DETECTED Final   Candida krusei NOT DETECTED NOT DETECTED Final   Candida parapsilosis NOT DETECTED NOT DETECTED Final   Candida tropicalis NOT DETECTED NOT DETECTED Final   Cryptococcus neoformans/gattii NOT DETECTED NOT DETECTED Final   CTX-M ESBL NOT DETECTED NOT DETECTED Final   Carbapenem resistance IMP NOT DETECTED NOT DETECTED Final   Carbapenem resistance KPC NOT DETECTED NOT DETECTED Final   Carbapenem resistance NDM NOT DETECTED NOT DETECTED Final   Carbapenem resist OXA 48 LIKE NOT DETECTED NOT DETECTED Final   Carbapenem resistance VIM NOT DETECTED NOT DETECTED Final    Comment: Performed at [facility], [address], [place], [place] [address]  MRSA Next Gen by PCR, Nasal     Status: None   Collection Time: 05/26/21  6:30 PM   Specimen: Nasal Mucosa; Nasal Swab  Result Value Ref Range Status   MRSA by PCR Next Gen NOT DETECTED NOT DETECTED Final    Comment: (NOTE) The GeneXpert MRSA Assay (FDA approved for NASAL specimens only), is one component of a comprehensive MRSA colonization surveillance program. It is not intended to diagnose MRSA infection nor to guide or monitor treatment for MRSA infections. Test performance is not FDA approved in patients less than 21 years old. Performed at [facility], [place] [address] [place], [place] [address]   Urine Culture     Status: None   Collection Time: 05/27/21  6:10 AM   Specimen: Urine, Clean Catch  Result Value Ref Range Status   Specimen Description URINE, CLEAN CATCH  Final   Special Requests NONE  Final   Culture   Final    NO GROWTH Performed at [facility], [place] [address], [place], [place] [address]    Report Status 05/28/2021 FINAL  Final     Serology:    Imaging: If present, new imagings (plain films, ct scans, and mri) have been personally visualized and interpreted; radiology  reports have been reviewed. Decision making incorporated into the Impression / Recommendations.  1/14 cxr Bilateral multilobar pna  Jabier Mutton, [facility] [phone] pager    05/28/2021, 2:04 PM
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
## 2021-05-28 NOTE — Plan of Care (Signed)
  Problem: Education: Goal: Knowledge of General Education information will improve Description: Including pain rating scale, medication(s)/side effects and non-pharmacologic comfort measures Outcome: Progressing   Problem: Health Behavior/Discharge Planning: Goal: Ability to manage health-related needs will improve Outcome: Progressing   Problem: Clinical Measurements: Goal: Will remain free from infection Outcome: Progressing   

## 2021-05-29 ENCOUNTER — Other Ambulatory Visit (HOSPITAL_COMMUNITY): Payer: Self-pay

## 2021-05-29 ENCOUNTER — Inpatient Hospital Stay (HOSPITAL_COMMUNITY): Payer: Medicare Other

## 2021-05-29 DIAGNOSIS — I5043 Acute on chronic combined systolic (congestive) and diastolic (congestive) heart failure: Secondary | ICD-10-CM

## 2021-05-29 LAB — GLUCOSE, CAPILLARY
Glucose-Capillary: 122 mg/dL — ABNORMAL HIGH (ref 70–99)
Glucose-Capillary: 124 mg/dL — ABNORMAL HIGH (ref 70–99)
Glucose-Capillary: 220 mg/dL — ABNORMAL HIGH (ref 70–99)
Glucose-Capillary: 99 mg/dL (ref 70–99)

## 2021-05-29 LAB — LEGIONELLA PNEUMOPHILA SEROGP 1 UR AG: L. pneumophila Serogp 1 Ur Ag: NEGATIVE

## 2021-05-29 MED ORDER — SACUBITRIL-VALSARTAN 49-51 MG PO TABS
1.0000 | ORAL_TABLET | Freq: Two times a day (BID) | ORAL | Status: DC
  Administered 2021-05-30 – 2021-06-01 (×5): 1 via ORAL
  Filled 2021-05-29 (×6): qty 1

## 2021-05-29 MED ORDER — HYDRALAZINE HCL 50 MG PO TABS
50.0000 mg | ORAL_TABLET | Freq: Three times a day (TID) | ORAL | Status: DC
  Administered 2021-05-29 – 2021-06-03 (×16): 50 mg via ORAL
  Filled 2021-05-29 (×16): qty 1

## 2021-05-29 MED ORDER — GADOBUTROL 1 MMOL/ML IV SOLN
10.0000 mL | Freq: Once | INTRAVENOUS | Status: AC | PRN
  Administered 2021-05-29: 10 mL via INTRAVENOUS

## 2021-05-29 NOTE — Progress Notes (Signed)
Mobility Specialist Progress Note    05/29/21 1130  Mobility  Activity Ambulated independently in hallway  Level of Assistance Standby assist, set-up cues, supervision of patient - no hands on  Assistive Device None  Distance Ambulated (ft) 1200 ft  Activity Response Tolerated fair  $Mobility charge 1 Mobility   During Mobility: 108 HR Post-Mobility: 96 HR  Pt received in bed and agreeable. Pt SOB with exertion. Returned to sitting EOB with call bell in reach.   [facility] Mobility Specialist  M.S. 2C and 6E: [phone] M.S. 4E: [phone]

## 2021-05-29 NOTE — Progress Notes (Signed)
Progress Note  Patient Name: Charles [name] Date of Encounter: 05/29/2021  Attending physician: Mendel Corning, MD Primary care provider: Patient, No Pcp Per (Inactive)   Subjective: Charles [name] is a 51 y.o. male who was seen and examined  Denies chest pain. Denies orthopnea, paroxysmal nocturnal dyspnea or lower extremity swelling. Blood pressure remains elevated. Scheduled for cardiac MRI.  Objective: Vital Signs in the last 24 hours: Temp:  [97.6 F (36.4 C)-98.1 F (36.7 C)] 98 F (36.7 C) (01/17 0724) Pulse Rate:  [86-95] 95 (01/17 0736) Resp:  [16-28] 16 (01/17 0736) BP: (138-176)/(85-129) 176/107 (01/17 0724) SpO2:  [92 %-98 %] 92 % (01/17 0736) Weight:  [71.4 kg] 71.4 kg (01/17 0410)  Intake/Output:  Intake/Output Summary (Last 24 hours) at 05/29/2021 0854 Last data filed at 05/29/2021 0700 Gross per 24 hour  Intake 1753.1 ml  Output 3200 ml  Net -1446.9 ml    Net IO Since Admission: -7,627.4 mL [05/29/21 0854]  Weights:  Filed Weights   05/26/21 1757 05/28/21 0411 05/29/21 0410  Weight: 69.8 kg 70.3 kg 71.4 kg    Telemetry: Personally reviewed.  Normal sinus rhythm with occasional ventricular ectopy  Physical examination: PHYSICAL EXAM: Vitals with BMI 05/29/2021 05/29/2021 05/29/2021  Height - - -  Weight - - 157 lbs 7 oz  BMI - - 0000000  Systolic - 0000000 0000000  Diastolic - [id number] 97  Pulse 95 - 86    CONSTITUTIONAL: Appears older than stated age, hemodynamically stable, no acute distress resting in bed.   SKIN: Skin is warm and dry. No rash noted. No cyanosis. No pallor. No jaundice HEAD: Normocephalic and atraumatic.  EYES: No scleral icterus MOUTH/THROAT: Moist oral membranes.  NECK: No JVD present. No thyromegaly noted. No carotid bruits  LYMPHATIC: No visible cervical adenopathy.  CHEST Normal respiratory effort. No intercostal retractions  LUNGS: Clear to auscultation bilaterally.  No stridor.  Mild expiratory wheezes. No rales.   CARDIOVASCULAR: Regular rate and rhythm, positive S1-S2, no murmurs rubs or gallops appreciated ABDOMINAL: Soft, nontender, nondistended, positive bowel sounds in all 4 quadrants, no apparent ascites.  EXTREMITIES: Trace bilateral pitting edema, warm to touch.  HEMATOLOGIC: No significant bruising NEUROLOGIC: Oriented to person, place, and time. Nonfocal. Normal muscle tone.  PSYCHIATRIC: Normal mood and affect. Normal behavior. Cooperative  Lab Results: Hematology Recent Labs  Lab 05/26/21 0444 05/26/21 0706 05/27/21 0628 05/28/21 0009  WBC 5.0  --  10.0 11.9*  RBC 3.71*  --  3.45* 3.47*  HGB 13.5 13.3 12.5* 12.3*  HCT 40.9 39.0 36.1* 37.1*  MCV 110.2*  --  104.6* 106.9*  MCH 36.4*  --  36.2* 35.4*  MCHC 33.0  --  34.6 33.2  RDW 18.7*  --  18.0* 18.0*  PLT 102*  --  106* 101*    Chemistry Recent Labs  Lab 05/26/21 1823 05/27/21 0120 05/28/21 0009 05/28/21 0627  NA  --  134* 134* 137  K  --  4.8 4.2 4.0  CL  --  102 101 102  CO2  --  20* 26 26  GLUCOSE  --  136* 165* 141*  BUN  --  25* 23* 18  CREATININE  --  1.15 1.13 1.13  CALCIUM  --  9.0 8.9 9.0  PROT 7.2 6.5  --   --   ALBUMIN 3.2* 3.1*  --   --   AST 40 35  --   --   ALT 28 25  --   --  ALKPHOS 71 68  --   --   BILITOT 0.9 0.9  --   --   GFRNONAA  --  >60 >60 >60  ANIONGAP  --  12 7 9      Cardiac Enzymes: Cardiac Panel (last 3 results) Recent Labs    05/26/21 1823  TROPONINIHS 43*    BNP (last 3 results) Recent Labs    06/06/20 2215 05/26/21 0444  BNP 126.2* 802.1*    ProBNP (last 3 results) No results for input(s): PROBNP in the last 8760 hours.   DDimer No results for input(s): DDIMER in the last 168 hours.   Hemoglobin A1c:  Lab Results  Component Value Date   HGBA1C 6.1 (H) 05/27/2021   MPG 128.37 05/27/2021    TSH No results for input(s): TSH in the last 8760 hours.  Lipid Panel No results found for: CHOL, TRIG, HDL, CHOLHDL, VLDL, LDLCALC,  LDLDIRECT  Imaging: ECHOCARDIOGRAM COMPLETE  Result Date: 05/28/2021    ECHOCARDIOGRAM REPORT   Patient Name:   Charles [name] Date of Exam: 05/28/2021 Medical Rec #:  [id number]       Height:       66.0 in Accession #:    [id number]      Weight:       155.0 lb Date of Birth:  Apr 22, 1971       BSA:          1.794 m Patient Age:    29 years        BP:           167/112 mmHg Patient Gender: M               HR:           81 bpm. Exam Location:  Inpatient Procedure: 2D Echo Indications:    Cardiomegaly  History:        Patient has prior history of Echocardiogram examinations, most                 recent 06/07/2020. COPD.  Sonographer:    Jefferey Pica Referring Phys: [name] IMPRESSIONS  1. Coarse apical trabeculation with deep recessess. 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 Left ventricular ejection fraction, by estimation, is 30 to 35% - 31.8% by 2D MOD. The left ventricle has moderately decreased function. The left ventricle demonstrates global hypokinesis. There is moderate left ventricular hypertrophy. Left ventricular diastolic parameters are consistent with Grade I diastolic dysfunction (impaired relaxation).  2. Right ventricular systolic function is normal. The right ventricular size is normal.  3. Left atrial size was mildly dilated.  4. The pericardial effusion is anterior to the right ventricle.  5. The mitral valve is abnormal. Trivial mitral valve regurgitation.  6. The aortic valve is tricuspid. Aortic valve regurgitation is not visualized.  7. The inferior vena cava is normal in size with greater than 50% respiratory variability, suggesting right atrial pressure of 3 mmHg. Comparison(s): Changes from prior study are noted. 06/07/2020: LVEF 60-65%, prominent apical trabeculae. Conclusion(s)/Recommendation(s): Findings concerning for possible LV non-compaction cardiomyopathy, would recommend Cardiac MRI for clarification. FINDINGS  Left Ventricle: Coarse apical trabeculation with  deep recessess. 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 Left ventricular ejection fraction, by estimation, is 30 to 35%. The left ventricle has moderately decreased function. The left ventricle demonstrates global hypokinesis. The left ventricular internal cavity size was normal in size. There is moderate left ventricular hypertrophy. Left ventricular diastolic parameters are consistent with Grade I diastolic dysfunction (impaired relaxation). Indeterminate  filling pressures. Right Ventricle: The right ventricular size is normal. No increase in right ventricular wall thickness. Right ventricular systolic function is normal. Left Atrium: Left atrial size was mildly dilated. Right Atrium: Right atrial size was normal in size. Pericardium: Trivial pericardial effusion is present. The pericardial effusion is anterior to the right ventricle. Mitral Valve: The mitral valve is abnormal. There is mild thickening of the mitral valve leaflet(s). Trivial mitral valve regurgitation. Tricuspid Valve: The tricuspid valve is grossly normal. Tricuspid valve regurgitation is trivial. Aortic Valve: The aortic valve is tricuspid. Aortic valve regurgitation is not visualized. Aortic valve peak gradient measures 8.2 mmHg. Pulmonic Valve: The pulmonic valve was normal in structure. Pulmonic valve regurgitation is not visualized. Aorta: The aortic root and ascending aorta are structurally normal, with no evidence of dilitation. Venous: The inferior vena cava is normal in size with greater than 50% respiratory variability, suggesting right atrial pressure of 3 mmHg. IAS/Shunts: No atrial level shunt detected by color flow Doppler.  LEFT VENTRICLE PLAX 2D LVIDd:         4.60 cm      Diastology LVIDs:         4.00 cm      LV e' medial:    4.81 cm/s LV PW:         1.60 cm      LV E/e' medial:  15.1 LV IVS:        1.20 cm      LV e' lateral:   5.63 cm/s LVOT diam:     2.10 cm      LV E/e' lateral: 12.9 LV SV:         67 LV SV Index:   37  LVOT Area:     3.46 cm  LV Volumes (MOD) LV vol d, MOD A2C: 195.6 ml LV vol d, MOD A4C: 146.2 ml LV vol s, MOD A4C: 99.7 ml LV SV MOD A4C:     146.2 ml RIGHT VENTRICLE             IVC RV Basal diam:  2.60 cm     IVC diam: 1.10 cm RV S prime:     13.00 cm/s TAPSE (M-mode): 1.8 cm LEFT ATRIUM             Index        RIGHT ATRIUM           Index LA diam:        3.70 cm 2.06 cm/m   RA Area:     15.60 cm LA Vol (A2C):   55.1 ml 30.71 ml/m  RA Volume:   40.20 ml  22.40 ml/m LA Vol (A4C):   57.2 ml 31.88 ml/m LA Biplane Vol: 62.2 ml 34.66 ml/m  AORTIC VALVE                 PULMONIC VALVE AV Area (Vmax): 3.17 cm     PV Vmax:       0.71 m/s AV Vmax:        143.00 cm/s  PV Peak grad:  2.0 mmHg AV Peak Grad:   8.2 mmHg LVOT Vmax:      131.00 cm/s LVOT Vmean:     79.200 cm/s LVOT VTI:       0.194 m  AORTA Ao Root diam: 3.70 cm Ao Asc diam:  3.50 cm MITRAL VALVE MV Area (PHT): 3.99 cm    SHUNTS MV Decel Time: 190 msec    Systemic VTI:  0.19 m MV E velocity: 72.80 cm/s  Systemic Diam: 2.10 cm MV A velocity: 93.80 cm/s MV E/A ratio:  0.78 Lyman Bishop MD Electronically signed by Lyman Bishop MD Signature Date/Time: 05/28/2021/10:58:11 AM    Final     CARDIAC DATABASE: EKG: 05/14/2021: Sinus tachycardia, 102 bpm, PAC and left LVH per voltage criteria, ST-T changes most likely secondary to LVH but ischemia cannot be ruled out.   05/26/2021: Sinus tachycardia, 109 bpm, LVH per voltage criteria, ST-T changes most likely secondary to LVH but ischemia cannot be ruled out.   Echocardiogram: 05/28/2020:  1. Coarse apical trabeculation with deep recessess. 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 Left ventricular ejection fraction, by estimation, is 30 to 35% - 31.8% by 2D MOD. The left ventricle has moderately decreased function. The left ventricle demonstrates global hypokinesis. There is moderate left ventricular hypertrophy. Left ventricular diastolic parameters are consistent with Grade I diastolic dysfunction  (impaired relaxation).   2. Right ventricular systolic function is normal. The right ventricular size is normal.   3. Left atrial size was mildly dilated.   4. The pericardial effusion is anterior to the right ventricle.   5. The mitral valve is abnormal. Trivial mitral valve regurgitation.   6. The aortic valve is tricuspid. Aortic valve regurgitation is not  visualized.   7. The inferior vena cava is normal in size with greater than 50% respiratory variability, suggesting right atrial pressure of 3 mmHg.   Comparison(s): Changes from prior study are noted. 06/07/2020: LVEF 60-65%, prominent apical trabeculae.   Conclusion(s)/Recommendation(s): Findings concerning for possible LV  non-compaction cardiomyopathy, would recommend Cardiac MRI for  clarification.  Cardiac MRI: Pending  Stress Testing:  None   Heart Catheterization: None  Scheduled Meds:  azithromycin  250 mg Oral Daily   dapagliflozin propanediol  10 mg Oral Daily   furosemide  40 mg Intravenous Daily   guaiFENesin  600 mg Oral BID   hydrALAZINE  10 mg Oral Q8H   insulin aspart  0-5 Units Subcutaneous QHS   insulin aspart  0-9 Units Subcutaneous TID WC   ipratropium-albuterol  3 mL Nebulization TID   irbesartan  150 mg Oral Daily   methylPREDNISolone (SOLU-MEDROL) injection  40 mg Intravenous Daily   nicotine  21 mg Transdermal Daily   sodium chloride flush  3 mL Intravenous Q12H    Continuous Infusions:  ampicillin-sulbactam (UNASYN) IV 3 g (05/29/21 0647)    PRN Meds: acetaminophen **OR** acetaminophen, albuterol   IMPRESSION & RECOMMENDATIONS: Charles [name] is a 51 y.o. African-American male whose past medical history and cardiac risk factors include: Hypertension, Hx of sleep apnea (no CPAP), hepatitis C untreated, prediabetes, chronic thrombocytopenia, history of aplastic anemia, smoking cigar and cigarettes, cocaine use, homelessness, non compliance.  Acute combined systolic and diastolic heart  failure, stage C, NYHA class II: Differential diagnosis includes but not limited to noncompaction cardiomyopathy elevated surface echocardiogram; viral etiology given hx of influenza, rhinovirus; hypertensive heart disease with uncontrolled hypertension and untreated sleep apnea, polysubstance abuse (cigars, cigarettes, cocaine), etc.  Net negative urine output since admission -7.6 L  Echocardiogram LVEF 60 to 65% (January 2022) now 30-35%  Respiratory viral panel positive for rhinovirus.   Morning labs pending : BNP, urine protein creatinine ratio, lipids  Urine drug screen still positive for cocaine as of 05/28/2021.  cMRI: pending.   Given the newly diagnosed cardiomyopathy recommended ischemia evaluation prior to discharge. Discussed either heart catheterization vs. Coronary CTA. CTA not the best option due to his  elevated HR. Since he has reduced LVEF cannot give CCB and due to recent cocaine use BB are contraindicated. Spoke to both patient and his niece (at patient's request) regarding the risk / benefits/ and alternative of left and right heart catheterization. They would like sometime to think about it prior to making a decision.   Medications reviewed: Farxiga, lasix, Avapro, Hydralazine. Farxiga started 05/29/2021. Will increase hydralazine to 50mg  po tid. Consider starting [facility] tomorrow depending on renal function.   Hypertension with heart failure Educated importance of blood pressure management and recommend reevaluation for sleep apnea as outpatient.  Polysubstance abuse: Regularly uses cocaine, cigars, cigarettes UDS still positive for cocaine as of 05/28/2021. Educated the importance of complete cessation of all recreational drug use.  Problems List: Acute respiratory failure with hypoxia: Improving, defer to primary team Multifocal pneumonia: Currently on antibiotics followed by primary ID COPD acute exacerbation: defer to primary team Cigarette smoking: Educated  importance of complete smoking cessation. Cocaine use: Educated on importance of complete cessation.  Patient remains hesitant despite knowing that he has reduced pumping activity of his heart (I.e LVEF) which predisposes him to further morbidity and mortality.  History of aplastic anemia/chronic thrombocytopenia.  Per primary team.   Hepatitis C: Defer to infectious disease. Noncompliance to both medical and outpatient follow-up.  Educated on the need of establishing care with PCP and cardiology once discharged.  Patient's questions and concerns were addressed to his satisfaction. He voices understanding of the instructions provided during this encounter.   This note was created using a voice recognition software as a result there may be grammatical errors inadvertently enclosed that do not reflect the nature of this encounter. Every attempt is made to correct such errors.  Total time spent: 35 minutes.   Mechele Claude [facility]  Pager: [phone] Office: [phone] 05/29/2021, 8:54 AM

## 2021-05-29 NOTE — Progress Notes (Signed)
Pt resting comfortably VS stable. No bipap needed at this time.

## 2021-05-29 NOTE — Progress Notes (Addendum)
Progress Note   Patient: Charles [name] [id number] DOB: 1970/11/18 DOA: 05/26/2021     3 DOS: the patient was seen and examined on 05/29/2021   Brief hospital course: Patient is a 51 year old male with COPD, hypertension, anemia, chronic thrombocytopenia, polysubstance abuse, homeless, presented with progressive worsening shortness of breath over the last 2 weeks.  Patient was placed on BiPAP in ED.  He also reported productive cough with brown/blackish sputum, wheezing, chills, chest pain, nausea, vomiting and lower extremity swelling.  Current smoker, admits to cocaine use.  Has been sleeping on the streets.  In ED, pulse 87-105, respiration 18-30, BP elevated 189/138, O2 sats maintained on BiPAP.  Creatinine 1.2, BNP 802, lactic acid 1.4. Chest x-ray showed severe multilobar bronchopneumonia right greater than left, mild to moderate cardiomegaly  Patient admitted for acute respiratory failure with hypoxia secondary to multilobar pneumonia, acute on chronic diastolic CHF, hypertensive urgency, COPD exacerbation.  1/16: 2D echo showed EF of 30 to 35%, grade 1 DD (down from 60 to 65% in 05/2020), cardiology consulted. Blood cultures + Acinetobacter, ID consulted  Assessment and Plan * Acute respiratory failure with hypoxia ([place])- (present on admission) - Secondary to multifocal pneumonia, COPD exacerbation,  acute on chronic diastolic CHF - Initially presented with hypoxia and was placed on BiPAP, currently off BiPAP, O2 sats stable 100% on room air -Wheezing bilaterally, placed on scheduled nebs, continue IV Solu-Medrol, antibiotics, flutter valve, IS  Bacteremia - Blood cultures positive for Acinetobacter, urine culture showed no growth.  Urine strep antigen negative. -Continue IV Unasyn.  ID following.  Multifocal pneumonia- (present on admission) - Chest x-ray concerning for severe Multilobar bronchopneumonia right greater than left, mild to moderate cardiomegaly.  Influenza,  COVID-19 negative. -Blood cultures positive for Acinetobacter, -ID consulted.  Antibiotics switched cefepime to Unasyn, follow sensitivities.  Recommended to complete Zithromax course for 5 days to finish on 1/18.  Acute on chronic systolic and diastolic CHF (congestive heart failure) ([place])- (present on admission) - Patient presented with hypoxia, elevated BNP 802, cardiomegaly on chest x-ray -2D echo showed EF of 30 to 35%, G1 DD, global hypokinesis.  (EF was 60 to 65% per echo in 05/2020), will consult cardiology -Continue Lasix 40 mg IV daily, cardiology consulted, recommended cardiac cath, cardiac MRI.  Continue current management -Negative balance of 8.1 L,  COPD with acute exacerbation ([place])- (present on admission) -Still wheezing, continue IV Solu-Medrol, scheduled nebs, antibiotics -Counseled on smoking cessation  Hypertensive urgency- (present on admission) - Noncompliant with medications, UDS positive for cocaine -Continue Avapro, increase hydralazine to 50 mg every 8 hours, Lasix 40 mg IV daily.  Amlodipine discontinued -Cardiology following, added Farxiga, will need Entresto, BiDil, -Not on beta-blockers due to cocaine use  Homelessness - TOC consult placed  Polysubstance abuse ([place])- (present on admission) - UDS positive for cocaine, counseled on substance abuse  Thrombocytopenia ([place])- (present on admission) - Chronic anemia with thrombocytopenia.  Baseline hemoglobin around 12, platelet counts have been improving -Monitor counts, currently stable  Hyperglycemia- (present on admission) - Likely due to IV steroids, hemoglobin A1c 6.1 -Continue sliding scale insulin  Recent Labs    05/27/21 1127 05/27/21 1517 05/27/21 2108 05/28/21 0600 05/28/21 1138  GLUCAP 175* 258* 176* 128* 113*     Subjective: No acute complaints, has scattered bilateral wheezing  Objective Vitals:   05/29/21 0724 05/29/21 0736  BP: (!) 176/107   Pulse:  95  Resp:  16  Temp: 98 F  (36.7 C)   SpO2:  92%    Physical Exam General: Alert and oriented x 3, NAD Cardiovascular: S1 S2 clear, RRR.  Trace pedal edema b/l Respiratory: Bilateral expiratory wheezing Gastrointestinal: Soft, nontender, nondistended, NBS Ext: trace pedal edema bilaterally Psych: Normal affect and demeanor, alert and oriented x3    Data Reviewed: CBC Latest Ref Rng & Units 05/28/2021 05/27/2021 05/26/2021  WBC 4.0 - 10.5 K/uL 11.9(H) 10.0 -  Hemoglobin 13.0 - 17.0 g/dL 12.3(L) 12.5(L) 13.3  Hematocrit 39.0 - 52.0 % 37.1(L) 36.1(L) 39.0  Platelets 150 - 400 K/uL 101(L) 106(L) -    BMP Latest Ref Rng & Units 05/28/2021 05/28/2021 05/27/2021  Glucose 70 - 99 mg/dL 141(H) 165(H) 136(H)  BUN 6 - 20 mg/dL 18 23(H) 25(H)  Creatinine 0.61 - 1.24 mg/dL 1.13 1.13 1.15  Sodium 135 - 145 mmol/L 137 134(L) 134(L)  Potassium 3.5 - 5.1 mmol/L 4.0 4.2 4.8  Chloride 98 - 111 mmol/L 102 101 102  CO2 22 - 32 mmol/L 26 26 20(L)  Calcium 8.9 - 10.3 mg/dL 9.0 8.9 9.0     2D echo 05/28/2021 LV noncompaction cardiomyopathy, 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  LV demonstrates global hypokinesis, moderate LVH, G1 DD  Family Communication: 35 minutes  Disposition: Status is: Inpatient  Remains inpatient appropriate because: Needs further cardiac work-up due to drop in EF, expiratory wheezing.    Time spent: 40 minutes  Author: Estill Cotta, MD 05/29/2021 1:05 PM  For on call review [email or web address].

## 2021-05-29 NOTE — Progress Notes (Signed)
Heart Failure Navigator Progress Note  Assessed for Heart & Vascular TOC clinic readiness.  Patient does not meet criteria due to [facility] Cardiology consulted this hospitalization.   New HFrEF: 30-35%, down from 05/2020 60-65%.   Chronic cocaine use. Medications free per pharmacy tech benefits check. Pt also gets medication stipend from his insurance company per cardiology note. Non compliant with medication, follow up and substance cessation.   Navigator available for reassessment of patient.   Ozella Rocks, MSN, RN Heart Failure Nurse Navigator [phone]

## 2021-05-29 NOTE — TOC Benefit Eligibility Note (Addendum)
Patient Product/process development scientist completed.    The patient is currently admitted and upon discharge could be taking Entresto 24-26 mg.  The current 30 day co-pay is, $0.00.   The patient is currently admitted and upon discharge could be taking Farxiga 10 mg.  The current 30 day co-pay is, $0.00.   The patient is currently admitted and upon discharge could be taking Jardiance 10 mg.  The current 30 day co-pay is, $0.00.   The patient is currently admitted and upon discharge could be taking isosorbide-hydralazine (Bidil) 20-37.5 mg tablet.  The current 30 day co-pay is, $0.00.   The patient is insured through [employer]     Roland Earl, CPhT Pharmacy Patient Advocate Specialist [facility] Patient Advocate Team Direct Number: [phone]  Fax: [phone]

## 2021-05-29 NOTE — Progress Notes (Signed)
Id brief note  Resp viral pcr enterovirus Bcx acinetobacter sensitivity still pending  Abx switched cefepime to amp-sulb; finishing up azith course as well for cap  Hds Afebrile On room air   Clinically improving   A/p Bsi acinetobacter CAP Enterovirus infection   -finish CAP course -continue amp-sulb pending susceptibility for acinetobacter - plan 7 day tx for this organism with appropriate abx -f/u sensitivity

## 2021-05-30 LAB — BASIC METABOLIC PANEL
Anion gap: 9 (ref 5–15)
BUN: 17 mg/dL (ref 6–20)
CO2: 27 mmol/L (ref 22–32)
Calcium: 8.5 mg/dL — ABNORMAL LOW (ref 8.9–10.3)
Chloride: 101 mmol/L (ref 98–111)
Creatinine, Ser: 1.11 mg/dL (ref 0.61–1.24)
GFR, Estimated: >60 mL/min (ref >60)
Glucose, Bld: 134 mg/dL — ABNORMAL HIGH (ref 70–99)
Potassium: 3.7 mmol/L (ref 3.5–5.1)
Sodium: 137 mmol/L (ref 135–145)

## 2021-05-30 LAB — BRAIN NATRIURETIC PEPTIDE: B Natriuretic Peptide: 57.6 pg/mL (ref 0.0–100.0)

## 2021-05-30 LAB — GLUCOSE, CAPILLARY
Glucose-Capillary: 96 mg/dL (ref 70–99)
Glucose-Capillary: 95 mg/dL (ref 70–99)
Glucose-Capillary: 133 mg/dL — ABNORMAL HIGH (ref 70–99)
Glucose-Capillary: 152 mg/dL — ABNORMAL HIGH (ref 70–99)

## 2021-05-30 MED ORDER — PREDNISONE 20 MG PO TABS
40.0000 mg | ORAL_TABLET | Freq: Every day | ORAL | Status: DC
  Administered 2021-05-31: 40 mg via ORAL
  Filled 2021-05-30: qty 2

## 2021-05-30 MED ORDER — IPRATROPIUM-ALBUTEROL 0.5-2.5 (3) MG/3ML IN SOLN
3.0000 mL | Freq: Two times a day (BID) | RESPIRATORY_TRACT | Status: DC
  Administered 2021-05-30 – 2021-05-31 (×3): 3 mL via RESPIRATORY_TRACT
  Filled 2021-05-30 (×4): qty 3

## 2021-05-30 MED ORDER — SALINE SPRAY 0.65 % NA SOLN
1.0000 | NASAL | Status: DC | PRN
  Administered 2021-05-30 – 2021-06-01 (×2): 1 via NASAL
  Filled 2021-05-30: qty 44

## 2021-05-30 MED ORDER — CARVEDILOL 6.25 MG PO TABS
6.2500 mg | ORAL_TABLET | Freq: Two times a day (BID) | ORAL | Status: DC
  Administered 2021-05-31 – 2021-06-03 (×7): 6.25 mg via ORAL
  Filled 2021-05-30 (×7): qty 1

## 2021-05-30 NOTE — Assessment & Plan Note (Signed)
Multifactorial secondary to CHF, COPD exacerbation and pneumonia.  Resolved, now on room air.

## 2021-05-30 NOTE — Assessment & Plan Note (Signed)
Chronic issue.  Stable.  

## 2021-05-30 NOTE — Assessment & Plan Note (Addendum)
Secondary to cocaine use plus noncompliant medications.  Hydralazine increased, has improved with diuretics.  Continued on Avapro.  Seen by cardiology and farxiga, BiDil and Entresto added.  Working to get his new medications to him prior to discharge.  No beta-blockers for cocaine use.  Amlodipine discontinued.

## 2021-05-30 NOTE — Assessment & Plan Note (Addendum)
Has diuresed over 11 L since admission.  Echocardiogram noted ejection fraction of 30-35%.  Patient underwent cardiac MRI and findings consistent with hypertensive heart disease.  Discussions were further for heart cath versus coronary CTA, however given that he is clinically much improved diuresis and chest pain-free plan is to follow-up with him as outpatient.  On farxiga, Lasix and hydralazine and Entresto restarted.  Now appears to be euvolemic.  Outpatient follow-up with cardiology.

## 2021-05-30 NOTE — Progress Notes (Signed)
[facility] Progress Note  Patient: Charles [name]    QIO:[id number]  DOA: 05/26/2021    Date of Service: the patient was seen and examined on 05/30/2021  Brief hospital course: 51 year old homeless male past medical history COPD, chronic diastolic heart failure hypertension, polysubstance abuse admitted on 1/14 for 2 weeks of progressively worsening shortness of breath and found to have hypoxia secondary to COPD exacerbation, multilobar pneumonia and acute on chronic CHF along with hypertensive urgency.  Following admission, patient's blood cultures grew out positive for Acinetobacter and echocardiogram noted ejection fraction of 30 to 35% with ejection fraction normal 1 year prior.  Patient treated with steroids, antibiotics, nebulizers, supplemental oxygen and diuretics.  Cardiology and infectious disease consulted.  Assessment and Plan: Cardiovascular and Mediastinum Acute on chronic systolic and diastolic CHF (congestive heart failure) (HCC) Assessment & Plan Has diuresed over 8 L since admission.  Echocardiogram noted ejection fraction of 30-35%.  Patient underwent cardiac MRI and findings consistent with hypertensive heart disease.  Discussions were further for heart cath versus coronary CTA, however given that he is clinically much improved diuresis and chest pain-free plan is to follow-up with him as outpatient.  On farxiga, Lasix and hydralazine and Entresto restarted.  Now appears to be euvolemic.  Outpatient follow-up with cardiology.  Hypertensive urgency Assessment & Plan Secondary to cocaine use plus noncompliant medications.  Hydralazine increased, has improved with diuretics.  Continued on Avapro.  Seen by cardiology and farxiga, BiDil and Entresto added.  No beta-blockers for cocaine use.  Amlodipine discontinued.  Respiratory COPD with acute exacerbation [facility]) Assessment & Plan Much improved.  Off of oxygen.  Wean down Solu-Medrol.  Finished antibiotics today.  Counseled  on smoking cessation.  Wheezing improved.  Multifocal pneumonia Assessment & Plan Sepsis ruled out.  COVID and flu negative.  Blood cultures positive for Acinetobacter.  Patient initially placed on cefepime and by ID recommendations, changed to Unasyn.  Completed course of antibiotics with p.o. Zithromax on 1/18.  * Acute respiratory failure with hypoxia [facility]) Assessment & Plan Multifactorial secondary to CHF, COPD exacerbation and pneumonia.  Resolved, now on room air.  Hematopoietic and Hemostatic Thrombocytopenia (HCC) Assessment & Plan Chronic issue.  Stable.  Other Hyperglycemia Assessment & Plan Prediabetic.  High sugar secondary to steroids.  A1c at 6.1.  Polysubstance abuse (HCC) Assessment & Plan Urine drug screen positive for cocaine.  Counseled.  Has been given resources.   Overweight: Body mass index is 25.66 kg/m.        Consultants: Cardiology Infectious disease  Procedures: 2D echocardiogram: Ejection fraction of 30-35%.  Significant decrease from 1 year ago.  Antimicrobials: Zithromax 1/14-1/18 IV cefepime 1/14-1/16 IV Unasyn 1/16-present  Code Status: Full code   Subjective: Patient complains of some mild congestion also some wheezing  Objective: Vital signs were reviewed and unremarkable. Vitals:   05/30/21 1114 05/30/21 1200  BP: 116/78   Pulse:    Resp: 18 20  Temp: 98 F (36.7 C)   SpO2:  95%    Intake/Output Summary (Last 24 hours) at 05/30/2021 1416 Last data filed at 05/30/2021 1114 Gross per 24 hour  Intake 1810 ml  Output 4350 ml  Net -2540 ml   Filed Weights   05/28/21 0411 05/29/21 0410 05/30/21 0600  Weight: 70.3 kg 71.4 kg 72.1 kg   Body mass index is 25.66 kg/m.  Exam:  General: Alert and oriented x3, no acute distress HEENT: Normocephalic and atraumatic, mucous membranes are moist Cardiovascular: Regular rate and  rhythm, S1-S2 Respiratory: Decreased breath sounds throughout, mild expiratory wheeze Abdomen:  Soft, nontender, nondistended, positive bowel sounds Musculoskeletal: No clubbing or cyanosis, trace pitting edema Skin: No skin breaks, tears or lesions Psychiatry: Appropriate, no evidence of psychoses Neurology: No focal deficits  Data Reviewed: My review of labs, imaging, notes and other tests shows no new significant findings.   Disposition:  Status is: Inpatient  Remains inpatient appropriate because: Completion of diuresis, wheezing   Family Communication: Declined for me to call anyone DVT Prophylaxis: SCDs Start: 05/26/21 0844 SCDs Start: 05/26/21 7824    Author: Hollice Espy ,MD 05/30/2021 2:16 PM  To reach On-call, see care teams to locate the attending and reach out via [email or web address]. Between 7PM-7AM, please contact night-coverage If you still have difficulty reaching the attending provider, please page the [facility] (Director on Call) for [facility] on amion for assistance.

## 2021-05-30 NOTE — Assessment & Plan Note (Addendum)
Urine drug screen positive for cocaine.  Counseled.  Has been given resources, although he is honest and states that he will not stop using.

## 2021-05-30 NOTE — Hospital Course (Addendum)
51 year old homeless male past medical history COPD, chronic diastolic heart failure hypertension, polysubstance abuse admitted on 1/14 for 2 weeks of progressively worsening shortness of breath and found to have hypoxia secondary to COPD exacerbation, multilobar pneumonia and acute on chronic CHF along with hypertensive urgency.  Following admission, patient's blood cultures grew out positive for Acinetobacter and echocardiogram noted ejection fraction of 30 to 35% with ejection fraction normal 1 year prior.  Patient treated with steroids, antibiotics, nebulizers, supplemental oxygen and diuretics.  Cardiology and infectious disease consulted.  Patient clinically improving.  Will need to be on IV Unasyn until Saturday, 1/21.

## 2021-05-30 NOTE — Progress Notes (Signed)
Would frequently ask for something to drink and eat. Explained  to watch on fluid intake  to avoid fluid overload. With visitor who brought some food  from outside.closely watched by tellesitter.

## 2021-05-30 NOTE — Assessment & Plan Note (Signed)
Prediabetic.  High sugar secondary to steroids.  A1c at 6.1.

## 2021-05-30 NOTE — Progress Notes (Signed)
Progress Note  Patient Name: Charles [name] Date of Encounter: 05/30/2021  Attending physician: Hollice Espy, MD Primary care provider: Patient, No Pcp Per (Inactive)   Subjective: Charles [name] is a 51 y.o. male who was seen and examined  Denies chest pain, orthopnea, paroxysmal nocturnal dyspnea or lower extremity swelling. Reviewed results of cardiac MRI  BNP and BMP pending  Blood pressure improved with increased hydralazine   Objective: Vital Signs in the last 24 hours: Temp:  [97.5 F (36.4 C)-98 F (36.7 C)] 97.8 F (36.6 C) (01/18 0729) Pulse Rate:  [90-96] 94 (01/18 0500) Resp:  [16-25] 25 (01/18 0729) BP: (119-152)/(79-103) 119/79 (01/18 0729) SpO2:  [94 %-96 %] 95 % (01/18 0740) Weight:  [72.1 kg] 72.1 kg (01/18 0600)  Intake/Output:  Intake/Output Summary (Last 24 hours) at 05/30/2021 0811 Last data filed at 05/30/2021 0729 Gross per 24 hour  Intake 2290 ml  Output 3100 ml  Net -810 ml    Net IO Since Admission: -8,437.4 mL [05/30/21 0811]  Weights:  Filed Weights   05/28/21 0411 05/29/21 0410 05/30/21 0600  Weight: 70.3 kg 71.4 kg 72.1 kg    Telemetry: Normal sinus rhythm with occasional ventricular ectopy  Physical examination: PHYSICAL EXAM: Vitals with BMI 05/30/2021 05/30/2021 05/30/2021  Height - - -  Weight - 158 lbs 15 oz -  BMI - 25.67 -  Systolic 119 - 152  Diastolic 79 - 103  Pulse - - 94    CONSTITUTIONAL: Appears older than stated age, hemodynamically stable, no acute distress resting in bed.   SKIN: Skin is warm and dry. No rash noted. No cyanosis. No pallor. No jaundice HEAD: Normocephalic and atraumatic.  EYES: No scleral icterus MOUTH/THROAT: Moist oral membranes.  NECK: No JVD present. No thyromegaly noted. No carotid bruits  LYMPHATIC: No visible cervical adenopathy.  CHEST Normal respiratory effort. No intercostal retractions  LUNGS: Clear to auscultation bilaterally.  No stridor.  Mild expiratory wheezes. No rales.   CARDIOVASCULAR: Regular rate and rhythm, positive S1-S2, no murmurs rubs or gallops appreciated ABDOMINAL: Soft, nontender, nondistended, positive bowel sounds in all 4 quadrants, no apparent ascites.  EXTREMITIES: No peripheral edema, warm to touch.  HEMATOLOGIC: No significant bruising NEUROLOGIC: Oriented to person, place, and time. Nonfocal. Normal muscle tone.  PSYCHIATRIC: Normal mood and affect. Normal behavior. Cooperative  Lab Results: Hematology Recent Labs  Lab 05/26/21 0444 05/26/21 0706 05/27/21 0628 05/28/21 0009  WBC 5.0  --  10.0 11.9*  RBC 3.71*  --  3.45* 3.47*  HGB 13.5 13.3 12.5* 12.3*  HCT 40.9 39.0 36.1* 37.1*  MCV 110.2*  --  104.6* 106.9*  MCH 36.4*  --  36.2* 35.4*  MCHC 33.0  --  34.6 33.2  RDW 18.7*  --  18.0* 18.0*  PLT 102*  --  106* 101*    Chemistry Recent Labs  Lab 05/26/21 1823 05/27/21 0120 05/28/21 0009 05/28/21 0627  NA  --  134* 134* 137  K  --  4.8 4.2 4.0  CL  --  102 101 102  CO2  --  20* 26 26  GLUCOSE  --  136* 165* 141*  BUN  --  25* 23* 18  CREATININE  --  1.15 1.13 1.13  CALCIUM  --  9.0 8.9 9.0  PROT 7.2 6.5  --   --   ALBUMIN 3.2* 3.1*  --   --   AST 40 35  --   --   ALT 28  25  --   --   ALKPHOS 71 68  --   --   BILITOT 0.9 0.9  --   --   GFRNONAA  --  >60 >60 >60  ANIONGAP  --  12 7 9      Cardiac Enzymes: Cardiac Panel (last 3 results) No results for input(s): CKTOTAL, CKMB, TROPONINIHS, RELINDX in the last 72 hours.   BNP (last 3 results) Recent Labs    06/06/20 2215 05/26/21 0444  BNP 126.2* 802.1*    ProBNP (last 3 results) No results for input(s): PROBNP in the last 8760 hours.   DDimer No results for input(s): DDIMER in the last 168 hours.   Hemoglobin A1c:  Lab Results  Component Value Date   HGBA1C 6.1 (H) 05/27/2021   MPG 128.37 05/27/2021    TSH No results for input(s): TSH in the last 8760 hours.  Lipid Panel No results found for: CHOL, TRIG, HDL, CHOLHDL, VLDL, LDLCALC,  LDLDIRECT  Imaging: MR CARDIAC MORPHOLOGY W WO CONTRAST  Result Date: 05/29/2021 EXAM: CARDIAC MRI TECHNIQUE: The patient was scanned on a 1.5 Tesla GE magnet. A dedicated cardiac coil was used. Functional imaging was done using Fiesta sequences. 2,3, and 4 chamber views were done to assess for RWMA's. Modified Simpson's rule using a short axis stack was used to calculate an ejection fraction on a dedicated work Conservation officer, nature. The patient received 10 cc of Gadavist. After 10 minutes inversion recovery sequences were used to assess for infiltration and scar tissue. CONTRAST:  Gadavist 10 cc FINDINGS: Limited images of the lung fields showed no gross abnormalities. Normal left ventricular size with moderate concentric LV hypertrophy, EF 28% with diffuse hypokinesis. The trabeculation pattern does not fit criteria for noncompaction. Normal right ventricular size with EF 46% (normal). Normal left and right atrial sizes. Trivial mitral regurgitation. Trileaflet aortic valve with no significant stenosis or regurgitation. On delayed enhancement imaging, there was rather subtle mid-wall late gadolinium enhancement primarily in the septum. MEASUREMENTS: MEASUREMENTS LVEDV 160 mL LVSV 44 mL LVEF 28% RVEDV 145 mL RVSV 66 mL RVEF 46% T2 47 (septum) T1 1067, ECV 30% IMPRESSION: 1. Normal LV size with moderate concentric LV hypertrophy. EF 28% with diffuse hypokinesis. He does not have hypertrabeculation consistent with noncompaction. 2.  Normal RV size and systolic function. 3. Rather subtle mid-wall LGE in the septum. This is not a coronary disease pattern. 4.  Normal T2 values. 5.  Mildlly elevated extracellular volume percentage at 30%. This could be consistent with long-standing hypertension (borderline ECV elevation, very subtle non-coronary LGE). Does not look like hypertrophic cardiomyopathy but cannot rule out. Charles [name] Electronically Signed   By: Loralie Champagne M.D.   On: 05/29/2021 15:17     CARDIAC DATABASE: EKG: 05/14/2021: Sinus tachycardia, 102 bpm, PAC and left LVH per voltage criteria, ST-T changes most likely secondary to LVH but ischemia cannot be ruled out.   05/26/2021: Sinus tachycardia, 109 bpm, LVH per voltage criteria, ST-T changes most likely secondary to LVH but ischemia cannot be ruled out.   Echocardiogram: 05/28/2020:  1. Coarse apical trabeculation with deep recessess. 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 Left ventricular ejection fraction, by estimation, is 30 to 35% - 31.8% by 2D MOD. The left ventricle has moderately decreased function. The left ventricle demonstrates global hypokinesis. There is moderate left ventricular hypertrophy. Left ventricular diastolic parameters are consistent with Grade I diastolic dysfunction (impaired relaxation).   2. Right ventricular systolic function is normal. The right ventricular size  is normal.   3. Left atrial size was mildly dilated.   4. The pericardial effusion is anterior to the right ventricle.   5. The mitral valve is abnormal. Trivial mitral valve regurgitation.   6. The aortic valve is tricuspid. Aortic valve regurgitation is not  visualized.   7. The inferior vena cava is normal in size with greater than 50% respiratory variability, suggesting right atrial pressure of 3 mmHg.   Comparison(s): Changes from prior study are noted. 06/07/2020: LVEF 60-65%, prominent apical trabeculae.   Conclusion(s)/Recommendation(s): Findings concerning for possible LV  non-compaction cardiomyopathy, would recommend Cardiac MRI for  clarification.  Cardiac MRI 05/29/2021: 1. Normal LV size with moderate concentric LV hypertrophy. EF 28% with diffuse hypokinesis. He does not have hypertrabeculation consistent with noncompaction. 2.  Normal RV size and systolic function.  3. Rather subtle mid-wall LGE in the septum. This is not a coronary disease pattern. 4.  Normal T2 values.  5.  Mildlly elevated extracellular volume  percentage at 30%. This could be consistent with long-standing hypertension (borderline ECV elevation, very subtle non-coronary LGE). Does not look like hypertrophic cardiomyopathy but cannot rule out.  Stress Testing:  None   Heart Catheterization: None  Scheduled Meds:  azithromycin  250 mg Oral Daily   dapagliflozin propanediol  10 mg Oral Daily   furosemide  40 mg Intravenous Daily   guaiFENesin  600 mg Oral BID   hydrALAZINE  50 mg Oral Q8H   insulin aspart  0-5 Units Subcutaneous QHS   insulin aspart  0-9 Units Subcutaneous TID WC   ipratropium-albuterol  3 mL Nebulization TID   methylPREDNISolone (SOLU-MEDROL) injection  40 mg Intravenous Daily   nicotine  21 mg Transdermal Daily   sacubitril-valsartan  1 tablet Oral BID   sodium chloride flush  3 mL Intravenous Q12H    Continuous Infusions:  ampicillin-sulbactam (UNASYN) IV 3 g (05/30/21 0130)    PRN Meds: acetaminophen **OR** acetaminophen, albuterol   IMPRESSION & RECOMMENDATIONS: Charles [name] is a 51 y.o. African-American male whose past medical history and cardiac risk factors include: Hypertension, Hx of sleep apnea (no CPAP), hepatitis C untreated, prediabetes, chronic thrombocytopenia, history of aplastic anemia, smoking cigar and cigarettes, cocaine use, homelessness, non compliance.  Acute combined systolic and diastolic heart failure, stage C, NYHA class II: Echocardiogram 05/28/2020 LVEF 30-35% Reviewed and discussed with patient results of cardiac MRI, findings consistent with hypertensive heart disease, not suggestive of noncompaction cardiomyopathy or hypertrophic cardiomyopathy.  Net negative urine output 8.4 L since admission  Urine drug screen still positive for cocaine as of 05/28/2021.  Given newly diagnosed cardiomyopathy with LVEF-35% discussed with patient regarding further ischemic evaluation including heart catheterization versus coronary CTA.  However patient is clinically much improved with  diuresis and is chest pain-free.  Therefore plan for ischemic evaluation on outpatient basis.  Continue Farxiga, hydralazine, Lasix.  Started Entresto this morning, continue this.  Patient is clinically much improved and euvolemic on exam.  Recommend continued medical therapy.  We will plan to uptitrate guideline directed medical therapy as renal function and hemodynamics allow on an outpatient basis.  Recommend transitioning to Lasix 40 mg p.o. twice daily at discharge.  Hypertension with heart failure Reiterated the importance of blood pressure management. Consider outpatient evaluation for sleep apnea.  Polysubstance abuse: UDS still positive for cocaine as of 05/28/2021. Educated the importance of complete cessation of all recreational drug use as well as tobacco use.  Noncompliance to both medical and outpatient follow-up.  Reiterated the importance of medication and follow-up compliance.   Will defer management of patients other medical conditions to primary team. He is stable for discharge on medical therapy from a cardiology standpoint. Will arrange for outpatient follow up with our office.   Patient was seen in collaboration with Dr. Einar Gip. He also reviewed patient's chart and examined the patient. Dr. Einar Gip is in agreement of the plan.    Alethia Berthold, PA-C 05/30/2021, 8:12 AM Office: [phone]

## 2021-05-30 NOTE — Assessment & Plan Note (Addendum)
Sepsis ruled out.  COVID and flu negative.  Blood cultures positive for Acinetobacter.  Patient initially placed on cefepime and by ID recommendations, changed to IV Unasyn until Saturday, 1/21.  Finished 5-day Zithromax course on 1/18.

## 2021-05-30 NOTE — Assessment & Plan Note (Addendum)
Much improved.  Off of oxygen.  Last dose of steroids on 1/20 morning.  Completes antibiotics on 1/21.  Counseled on smoking cessation.  Wheezing improved.

## 2021-05-31 ENCOUNTER — Other Ambulatory Visit (HOSPITAL_COMMUNITY): Payer: Self-pay

## 2021-05-31 DIAGNOSIS — J441 Chronic obstructive pulmonary disease with (acute) exacerbation: Secondary | ICD-10-CM

## 2021-05-31 LAB — GLUCOSE, CAPILLARY
Glucose-Capillary: 105 mg/dL — ABNORMAL HIGH (ref 70–99)
Glucose-Capillary: 120 mg/dL — ABNORMAL HIGH (ref 70–99)
Glucose-Capillary: 139 mg/dL — ABNORMAL HIGH (ref 70–99)
Glucose-Capillary: 140 mg/dL — ABNORMAL HIGH (ref 70–99)

## 2021-05-31 LAB — CBC
HCT: 45 % (ref 39.0–52.0)
Hemoglobin: 15.4 g/dL (ref 13.0–17.0)
MCH: 35.8 pg — ABNORMAL HIGH (ref 26.0–34.0)
MCHC: 34.2 g/dL (ref 30.0–36.0)
MCV: 104.7 fL — ABNORMAL HIGH (ref 80.0–100.0)
Platelets: 135 10*3/uL — ABNORMAL LOW (ref 150–400)
RBC: 4.3 MIL/uL (ref 4.22–5.81)
RDW: 18 % — ABNORMAL HIGH (ref 11.5–15.5)
WBC: 11.4 10*3/uL — ABNORMAL HIGH (ref 4.0–10.5)
nRBC: 0 % (ref 0.0–0.2)

## 2021-05-31 LAB — CULTURE, BLOOD (ROUTINE X 2): Special Requests: ADEQUATE

## 2021-05-31 MED ORDER — PREDNISONE 20 MG PO TABS
20.0000 mg | ORAL_TABLET | Freq: Every day | ORAL | Status: AC
  Administered 2021-06-01: 20 mg via ORAL
  Filled 2021-05-31: qty 1

## 2021-05-31 NOTE — Progress Notes (Signed)
[facility] Progress Note  Patient: Charles [name]    QZE:[id number]  DOA: 05/26/2021    Date of Service: the patient was seen and examined on 05/31/2021  Brief hospital course: 51 year old homeless male past medical history COPD, chronic diastolic heart failure hypertension, polysubstance abuse admitted on 1/14 for 2 weeks of progressively worsening shortness of breath and found to have hypoxia secondary to COPD exacerbation, multilobar pneumonia and acute on chronic CHF along with hypertensive urgency.  Following admission, patient's blood cultures grew out positive for Acinetobacter and echocardiogram noted ejection fraction of 30 to 35% with ejection fraction normal 1 year prior.  Patient treated with steroids, antibiotics, nebulizers, supplemental oxygen and diuretics.  Cardiology and infectious disease consulted.  Patient clinically improving.  Will need to be on IV Unasyn until Saturday, 1/21.  Assessment and Plan: Cardiovascular and Mediastinum Acute on chronic systolic and diastolic CHF (congestive heart failure) (HCC) Assessment & Plan Has diuresed over 11 L since admission.  Echocardiogram noted ejection fraction of 30-35%.  Patient underwent cardiac MRI and findings consistent with hypertensive heart disease.  Discussions were further for heart cath versus coronary CTA, however given that he is clinically much improved diuresis and chest pain-free plan is to follow-up with him as outpatient.  On farxiga, Lasix and hydralazine and Entresto restarted.  Now appears to be euvolemic.  Outpatient follow-up with cardiology.  Hypertensive urgency Assessment & Plan Secondary to cocaine use plus noncompliant medications.  Hydralazine increased, has improved with diuretics.  Continued on Avapro.  Seen by cardiology and farxiga, BiDil and Entresto added.  Working to get his new medications to him prior to discharge.  No beta-blockers for cocaine use.  Amlodipine  discontinued.  Respiratory COPD with acute exacerbation [facility]) Assessment & Plan Much improved.  Off of oxygen.  Last dose of steroids on 1/20 morning.  Completes antibiotics on 1/21.  Counseled on smoking cessation.  Wheezing improved.  Multifocal pneumonia Assessment & Plan Sepsis ruled out.  COVID and flu negative.  Blood cultures positive for Acinetobacter.  Patient initially placed on cefepime and by ID recommendations, changed to IV Unasyn until Saturday, 1/21.  Finished 5-day Zithromax course on 1/18.  * Acute respiratory failure with hypoxia [facility]) Assessment & Plan Multifactorial secondary to CHF, COPD exacerbation and pneumonia.  Resolved, now on room air.  Hematopoietic and Hemostatic Thrombocytopenia (HCC) Assessment & Plan Chronic issue.  Stable.  Other Hyperglycemia Assessment & Plan Prediabetic.  High sugar secondary to steroids.  A1c at 6.1.  Homelessness Assessment & Plan Patient states he will be staying with his sister upon discharge.  Polysubstance abuse (HCC) Assessment & Plan Urine drug screen positive for cocaine.  Counseled.  Has been given resources, although he is honest and states that he will not stop using.   Overweight: Body mass index is 25.55 kg/m.        Consultants: Cardiology Infectious disease  Procedures: 2D echocardiogram: Ejection fraction of 30-35%.  Significant decrease from 1 year ago.  Antimicrobials: Zithromax 1/14-1/18 IV cefepime 1/15-1/16 IV Unasyn 1/16-1/21  Code Status: Full code   Subjective: Breathing easier, mild congestion  Objective: Vital signs were reviewed and unremarkable. Vitals:   05/31/21 1000 05/31/21 1102  BP: 109/66 125/87  Pulse:    Resp: 16 (!) 25  Temp:    SpO2:      Intake/Output Summary (Last 24 hours) at 05/31/2021 1304 Last data filed at 05/31/2021 1102 Gross per 24 hour  Intake 1000 ml  Output 3525 ml  Net -2525 ml    Filed Weights   05/29/21 0410 05/30/21 0600 05/31/21  0355  Weight: 71.4 kg 72.1 kg 71.8 kg   Body mass index is 25.55 kg/m.  Exam:  General: Alert and oriented x3, no acute distress HEENT: Normocephalic and atraumatic, mucous membranes are moist Cardiovascular: Regular rate and rhythm, S1-S2 Respiratory: Decreased breath sounds throughout, wheezing resolved Abdomen: Soft, nontender, nondistended, positive bowel sounds Musculoskeletal: No clubbing or cyanosis, trace pitting edema Skin: No skin breaks, tears or lesions Psychiatry: Appropriate, no evidence of psychoses Neurology: No focal deficits  Data Reviewed: My review of labs, imaging, notes and other tests shows no new significant findings.   Disposition:  Status is: Inpatient  Remains inpatient appropriate because: Completion of IV antibiotics on 1/21   Family Communication: Declined for me to call anyone DVT Prophylaxis: SCDs Start: 05/26/21 0844 SCDs Start: 05/26/21 7628    Author: Hollice Espy ,MD 05/31/2021 1:04 PM  To reach On-call, see care teams to locate the attending and reach out via [email or web address]. Between 7PM-7AM, please contact night-coverage If you still have difficulty reaching the attending provider, please page the [facility] (Director on Call) for [facility] on amion for assistance.

## 2021-05-31 NOTE — Assessment & Plan Note (Signed)
Patient states he will be staying with his sister upon discharge.

## 2021-05-31 NOTE — Plan of Care (Signed)

## 2021-05-31 NOTE — Progress Notes (Signed)
[facility]  Date of Admission:  05/26/2021       Abx: Amp-sulb 1/16-c  ASSESSMENT: Enterovirus/rinovirus infection/pneumonitis Copd exacerbation Bacteremia   Rather interesting blood culture result. Denies ivdu. The acinetobacter is present in both sets whereas the pantoa is only in 1 set ?contaminant. The lactobacillus species is certainly a contaminant  Clinically improving on amp-sulb. Would finish 7 day 1/16-1/22. If discharging could finish course with levofloxacin  Clinically doing well from id standpoint  Will sign off  Discuss with primary team     Principal Problem:   Acute respiratory failure with hypoxia (HCC) Active Problems:   Multifocal pneumonia   Thrombocytopenia (HCC)   Hypertensive urgency   Polysubstance abuse ([place])   COPD with acute exacerbation (HCC)   Homelessness   Bacteremia   Acute on chronic systolic and diastolic CHF (congestive heart failure) (HCC)   Hyperglycemia   Allergies  Allergen Reactions   Eggs Or Egg-Derived Products Nausea And Vomiting and Swelling   Banana Nausea And Vomiting   Pork-Derived Products Nausea And Vomiting    Scheduled Meds:  carvedilol  6.25 mg Oral BID WC   dapagliflozin propanediol  10 mg Oral Daily   furosemide  40 mg Intravenous Daily   guaiFENesin  600 mg Oral BID   hydrALAZINE  50 mg Oral Q8H   insulin aspart  0-5 Units Subcutaneous QHS   insulin aspart  0-9 Units Subcutaneous TID WC   ipratropium-albuterol  3 mL Nebulization BID   nicotine  21 mg Transdermal Daily   [START ON 06/01/2021] predniSONE  20 mg Oral Q breakfast   sacubitril-valsartan  1 tablet Oral BID   sodium chloride flush  3 mL Intravenous Q12H   Continuous Infusions:  ampicillin-sulbactam (UNASYN) IV 3 g (05/31/21 0806)   PRN Meds:.acetaminophen **OR** acetaminophen, albuterol, sodium chloride   SUBJECTIVE: Coughing still and dyspnea, but getting around oob well, eating well, and no  sign of sepsis otherwise No n/v/diarrhea   Review of Systems: ROS All other ROS was negative, except mentioned above     OBJECTIVE: Vitals:   05/31/21 0800 05/31/21 0851 05/31/21 1000 05/31/21 1102  BP:   109/66 125/87  Pulse: 86     Resp: (!) 27  16 (!) 25  Temp: 98.3 F (36.8 C)     TempSrc: Oral     SpO2: 93% 99%    Weight:      Height:       Body mass index is 25.55 kg/m.  Physical Exam  General/constitutional: no distress, pleasant HEENT: Normocephalic, PER, Conj Clear, EOMI, Oropharynx clear Neck supple CV: rrr no mrg Lungs: clear to auscultation, normal respiratory effort Abd: Soft, Nontender Ext: no edema Skin: No Rash Neuro: nonfocal MSK: no peripheral joint swelling/tenderness/warmth   Lab Results Lab Results  Component Value Date   WBC 11.4 (H) 05/31/2021   HGB 15.4 05/31/2021   HCT 45.0 05/31/2021   MCV 104.7 (H) 05/31/2021   PLT 135 (L) 05/31/2021    Lab Results  Component Value Date   CREATININE 1.11 05/30/2021   BUN 17 05/30/2021   NA 137 05/30/2021   K 3.7 05/30/2021   CL 101 05/30/2021   CO2 27 05/30/2021    Lab Results  Component Value Date   ALT 25 05/27/2021   AST 35 05/27/2021   ALKPHOS 68 05/27/2021   BILITOT 0.9 05/27/2021      Microbiology: Recent Results (from the past 240  hour(s))  Resp Panel by RT-PCR (Flu A&B, Covid) Nasopharyngeal Swab     Status: None   Collection Time: 05/26/21  4:40 AM   Specimen: Nasopharyngeal Swab; Nasopharyngeal(NP) swabs in vial transport medium  Result Value Ref Range Status   SARS Coronavirus 2 by RT PCR NEGATIVE NEGATIVE Final    Comment: (NOTE) SARS-CoV-2 target nucleic acids are NOT DETECTED.  The SARS-CoV-2 RNA is generally detectable in upper respiratory specimens during the acute phase of infection. The lowest concentration of SARS-CoV-2 viral copies this assay can detect is 138 copies/mL. A negative result does not preclude SARS-Cov-2 infection and should not be used as the  sole basis for treatment or other patient management decisions. A negative result may occur with  improper specimen collection/handling, submission of specimen other than nasopharyngeal swab, presence of viral mutation(s) within the areas targeted by this assay, and inadequate number of viral copies(<138 copies/mL). A negative result must be combined with clinical observations, patient history, and epidemiological information. The expected result is Negative.  Fact Sheet for Patients:  EntrepreneurPulse.com.au  Fact Sheet for Healthcare Providers:  IncredibleEmployment.be  This test is no t yet approved or cleared by the [place] FDA and  has been authorized for detection and/or diagnosis of SARS-CoV-2 by FDA under an Emergency Use Authorization (EUA). This EUA will remain  in effect (meaning this test can be used) for the duration of the COVID-19 declaration under Section 564(b)(1) of the Act, 21 U.S.C.section 360bbb-3(b)(1), unless the authorization is terminated  or revoked sooner.       Influenza A by PCR NEGATIVE NEGATIVE Final   Influenza B by PCR NEGATIVE NEGATIVE Final    Comment: (NOTE) The Xpert Xpress SARS-CoV-2/FLU/RSV plus assay is intended as an aid in the diagnosis of influenza from Nasopharyngeal swab specimens and should not be used as a sole basis for treatment. Nasal washings and aspirates are unacceptable for Xpert Xpress SARS-CoV-2/FLU/RSV testing.  Fact Sheet for Patients: EntrepreneurPulse.com.au  Fact Sheet for Healthcare Providers: IncredibleEmployment.be  This test is not yet approved or cleared by the [place] FDA and has been authorized for detection and/or diagnosis of SARS-CoV-2 by FDA under an Emergency Use Authorization (EUA). This EUA will remain in effect (meaning this test can be used) for the duration of the COVID-19 declaration under Section 564(b)(1) of the  Act, 21 U.S.C. section 360bbb-3(b)(1), unless the authorization is terminated or revoked.  Performed at [facility], [place] [address], [place], [place] [address]   Blood Culture (routine x 2)     Status: Abnormal   Collection Time: 05/26/21  5:09 AM   Specimen: BLOOD  Result Value Ref Range Status   Specimen Description BLOOD SITE NOT SPECIFIED  Final   Special Requests   Final    BOTTLES DRAWN AEROBIC AND ANAEROBIC Blood Culture adequate volume   Culture  Setup Time   Final    GRAM NEGATIVE RODS IN BOTH AEROBIC AND ANAEROBIC BOTTLES CRITICAL VALUE NOTED.  VALUE IS CONSISTENT WITH PREVIOUSLY REPORTED AND CALLED VALUE.    Culture (A)  Final    ACINETOBACTER LWOFFII STAPHYLOCOCCUS COHNII LACTOBACILLUS SPECIES Standardized susceptibility testing for this organism is not available. Performed at [facility], [place] [address] [place], [place] [address]    Report Status 05/31/2021 FINAL  Final  Blood Culture (routine x 2)     Status: Abnormal   Collection Time: 05/26/21  5:14 AM   Specimen: BLOOD  Result Value Ref Range Status  Specimen Description BLOOD SITE NOT SPECIFIED  Final   Special Requests   Final    BOTTLES DRAWN AEROBIC AND ANAEROBIC Blood Culture adequate volume   Culture  Setup Time   Final    GRAM NEGATIVE RODS AEROBIC BOTTLE ONLY CRITICAL RESULT CALLED TO, READ BACK BY AND VERIFIED WITH: PHARMD JAMES LEDFORD 05/27/21@1 :15 BY TW    Culture (A)  Final    PANTOEA AGGLOMERANS ACINETOBACTER LWOFFII Standardized susceptibility testing for this organism is not available. Performed at [facility], [place] [address] [place], [place] [address]    Report Status 05/31/2021 FINAL  Final   Organism ID, Bacteria PANTOEA AGGLOMERANS  Final      Susceptibility   Pantoea agglomerans - MIC*    CEFAZOLIN <=4 SENSITIVE Sensitive     CEFEPIME <=0.12 SENSITIVE Sensitive     CEFTAZIDIME <=1 SENSITIVE Sensitive     CEFTRIAXONE <=0.25 SENSITIVE Sensitive      CIPROFLOXACIN <=0.25 SENSITIVE Sensitive     GENTAMICIN <=1 SENSITIVE Sensitive     IMIPENEM 0.5 SENSITIVE Sensitive     TRIMETH/SULFA <=20 SENSITIVE Sensitive     PIP/TAZO <=4 SENSITIVE Sensitive     * PANTOEA AGGLOMERANS  Blood Culture ID Panel (Reflexed)     Status: Abnormal   Collection Time: 05/26/21  5:14 AM  Result Value Ref Range Status   Enterococcus faecalis NOT DETECTED NOT DETECTED Final   Enterococcus Faecium NOT DETECTED NOT DETECTED Final   Listeria monocytogenes NOT DETECTED NOT DETECTED Final   Staphylococcus species NOT DETECTED NOT DETECTED Final   Staphylococcus aureus (BCID) NOT DETECTED NOT DETECTED Final   Staphylococcus epidermidis NOT DETECTED NOT DETECTED Final   Staphylococcus lugdunensis NOT DETECTED NOT DETECTED Final   Streptococcus species NOT DETECTED NOT DETECTED Final   Streptococcus agalactiae NOT DETECTED NOT DETECTED Final   Streptococcus pneumoniae NOT DETECTED NOT DETECTED Final   Streptococcus pyogenes NOT DETECTED NOT DETECTED Final   A.calcoaceticus-baumannii NOT DETECTED NOT DETECTED Final   Bacteroides fragilis NOT DETECTED NOT DETECTED Final   Enterobacterales DETECTED (A) NOT DETECTED Final    Comment: Enterobacterales represent a large order of gram negative bacteria, not a single organism. Refer to culture for further identification. CRITICAL RESULT CALLED TO, READ BACK BY AND VERIFIED WITH: PHARMD JAMES LEDFORD 05/27/21@1 :15 BY TW    Enterobacter cloacae complex NOT DETECTED NOT DETECTED Final   Escherichia coli NOT DETECTED NOT DETECTED Final   Klebsiella aerogenes NOT DETECTED NOT DETECTED Final   Klebsiella oxytoca NOT DETECTED NOT DETECTED Final   Klebsiella pneumoniae NOT DETECTED NOT DETECTED Final   Proteus species NOT DETECTED NOT DETECTED Final   Salmonella species NOT DETECTED NOT DETECTED Final   Serratia marcescens NOT DETECTED NOT DETECTED Final   Haemophilus influenzae NOT DETECTED NOT DETECTED Final   Neisseria  meningitidis NOT DETECTED NOT DETECTED Final   Pseudomonas aeruginosa NOT DETECTED NOT DETECTED Final   Stenotrophomonas maltophilia NOT DETECTED NOT DETECTED Final   Candida albicans NOT DETECTED NOT DETECTED Final   Candida auris NOT DETECTED NOT DETECTED Final   Candida glabrata NOT DETECTED NOT DETECTED Final   Candida krusei NOT DETECTED NOT DETECTED Final   Candida parapsilosis NOT DETECTED NOT DETECTED Final   Candida tropicalis NOT DETECTED NOT DETECTED Final   Cryptococcus neoformans/gattii NOT DETECTED NOT DETECTED Final   CTX-M ESBL NOT DETECTED NOT DETECTED Final   Carbapenem resistance IMP NOT DETECTED NOT DETECTED Final   Carbapenem resistance KPC NOT DETECTED NOT DETECTED Final  Carbapenem resistance NDM NOT DETECTED NOT DETECTED Final   Carbapenem resist OXA 48 LIKE NOT DETECTED NOT DETECTED Final   Carbapenem resistance VIM NOT DETECTED NOT DETECTED Final    Comment: Performed at [facility], [place] [address] [place] [address], [place], [place] [address]  MRSA Next Gen by PCR, Nasal     Status: None   Collection Time: 05/26/21  6:30 PM   Specimen: Nasal Mucosa; Nasal Swab  Result Value Ref Range Status   MRSA by PCR Next Gen NOT DETECTED NOT DETECTED Final    Comment: (NOTE) The GeneXpert MRSA Assay (FDA approved for NASAL specimens only), is one component of a comprehensive MRSA colonization surveillance program. It is not intended to diagnose MRSA infection nor to guide or monitor treatment for MRSA infections. Test performance is not FDA approved in patients less than 49 years old. Performed at [facility], [place] [address], [place], [place] [address]   Urine Culture     Status: None   Collection Time: 05/27/21  6:10 AM   Specimen: Urine, Clean Catch  Result Value Ref Range Status   Specimen Description URINE, CLEAN CATCH  Final   Special Requests NONE  Final   Culture   Final    NO GROWTH Performed at [facility], [place] [address] [place], [place]  [address]    Report Status 05/28/2021 FINAL  Final  Culture, blood (Routine X 2) w Reflex to ID Panel     Status: None (Preliminary result)   Collection Time: 05/28/21  2:56 PM   Specimen: BLOOD  Result Value Ref Range Status   Specimen Description BLOOD BLOOD LEFT FOREARM  Final   Special Requests   Final    BOTTLES DRAWN AEROBIC AND ANAEROBIC Blood Culture adequate volume   Culture   Final    NO GROWTH 3 DAYS Performed at [facility], [place] [address] [place], [place] [address]    Report Status PENDING  Incomplete  Culture, blood (Routine X 2) w Reflex to ID Panel     Status: None (Preliminary result)   Collection Time: 05/28/21  2:57 PM   Specimen: BLOOD  Result Value Ref Range Status   Specimen Description BLOOD RIGHT ANTECUBITAL  Final   Special Requests   Final    BOTTLES DRAWN AEROBIC AND ANAEROBIC Blood Culture adequate volume   Culture   Final    NO GROWTH 3 DAYS Performed at [facility], [place] [address], [place], [place] [address]    Report Status PENDING  Incomplete  Respiratory (~20 pathogens) panel by PCR     Status: Abnormal   Collection Time: 05/28/21  7:04 PM   Specimen: Nasopharyngeal Swab; Respiratory  Result Value Ref Range Status   Adenovirus NOT DETECTED NOT DETECTED Final   Coronavirus 229E NOT DETECTED NOT DETECTED Final    Comment: (NOTE) The Coronavirus on the Respiratory Panel, DOES NOT test for the novel  Coronavirus (2019 nCoV)    Coronavirus HKU1 NOT DETECTED NOT DETECTED Final   Coronavirus NL63 NOT DETECTED NOT DETECTED Final   Coronavirus OC43 NOT DETECTED NOT DETECTED Final   Metapneumovirus NOT DETECTED NOT DETECTED Final   Rhinovirus / Enterovirus DETECTED (A) NOT DETECTED Final   Influenza A NOT DETECTED NOT DETECTED Final   Influenza B NOT DETECTED NOT DETECTED Final   Parainfluenza Virus 1 NOT DETECTED NOT DETECTED Final   Parainfluenza Virus 2 NOT DETECTED NOT DETECTED Final   Parainfluenza Virus 3  NOT DETECTED NOT DETECTED  Final   Parainfluenza Virus 4 NOT DETECTED NOT DETECTED Final   Respiratory Syncytial Virus NOT DETECTED NOT DETECTED Final   Bordetella pertussis NOT DETECTED NOT DETECTED Final   Bordetella Parapertussis NOT DETECTED NOT DETECTED Final   Chlamydophila pneumoniae NOT DETECTED NOT DETECTED Final   Mycoplasma pneumoniae NOT DETECTED NOT DETECTED Final    Comment: Performed at [facility], [place] [address] [place], [place] [address]     Serology:   Imaging: If present, new imagings (plain films, ct scans, and mri) have been personally visualized and interpreted; radiology reports have been reviewed. Decision making incorporated into the Impression / Recommendations.   Jabier Mutton, [facility] [place] [phone] pager    05/31/2021, 11:48 AM

## 2021-05-31 NOTE — TOC Progression Note (Signed)
Transition of Care [facility]) - Progression Note    Patient Details  Name: Charles [name] MRN: [id number] Date of Birth: 1970/07/08  Transition [facility]) CM/SW Contact  Beckie Busing, RN Phone Number:[phone]  05/31/2021, 2:54 PM  Clinical Narrative:    TOC followed up on request for medication assistance. TOC can not assist with medication assistance. CM verified insurance benefits through [facility] pharmacy. Patient's coverage is active and he has zero co pays. Meds can be sent to [facility] pharmacy to be filled but there are no TOC need for medication assist. TOC will sign off     Barriers to Discharge: Continued Medical Work up  Expected Discharge Plan and Services   In-house Referral: NA Discharge Planning Services: CM Consult Post Acute Care Choice: NA Living arrangements for the past 2 months: Homeless                 DME Arranged: N/A DME Agency: NA       HH Arranged: NA HH Agency: NA         Social Determinants of Health (SDOH) Interventions    Readmission Risk Interventions Readmission Risk Prevention Plan 05/28/2021  Transportation Screening Complete  PCP or Specialist Appt within 3-5 Days Complete  HRI or Home Care Consult Complete  Social Work Consult for Recovery Care Planning/Counseling Complete  Palliative Care Screening Not Applicable  Medication Review Oceanographer) Referral to Pharmacy  Some recent data might be hidden

## 2021-05-31 NOTE — Progress Notes (Signed)
Patient showered and linens changed, reports feeling great although mild SOB with the activity, into bed to rest

## 2021-05-31 NOTE — Progress Notes (Signed)
Mobility Specialist Progress Note    05/31/21 1004  Mobility  Activity Ambulated independently in hallway  Level of Assistance Standby assist, set-up cues, supervision of patient - no hands on  Assistive Device None  Distance Ambulated (ft) 1200 ft  Activity Response Tolerated fair  $Mobility charge 1 Mobility   Pre-Mobility: 86 HR During Mobility: 98 HR Post-Mobility: 87 HR, 109/66 BP  Pt received in bed and agreeable. Pt SOB with exertion. Did have on coughing fit. Returned to sitting EOB. Voided before and after ambulation.   [facility] Mobility Specialist  M.S. 2C and 6E: [phone] M.S. 4E: [phone]

## 2021-06-01 ENCOUNTER — Telehealth: Payer: Self-pay

## 2021-06-01 ENCOUNTER — Other Ambulatory Visit (HOSPITAL_COMMUNITY): Payer: Self-pay

## 2021-06-01 DIAGNOSIS — R739 Hyperglycemia, unspecified: Secondary | ICD-10-CM

## 2021-06-01 LAB — GLUCOSE, CAPILLARY
Glucose-Capillary: 100 mg/dL — ABNORMAL HIGH (ref 70–99)
Glucose-Capillary: 102 mg/dL — ABNORMAL HIGH (ref 70–99)
Glucose-Capillary: 140 mg/dL — ABNORMAL HIGH (ref 70–99)
Glucose-Capillary: 133 mg/dL — ABNORMAL HIGH (ref 70–99)

## 2021-06-01 LAB — BASIC METABOLIC PANEL
Anion gap: 6 (ref 5–15)
BUN: 25 mg/dL — ABNORMAL HIGH (ref 6–20)
CO2: 26 mmol/L (ref 22–32)
Calcium: 8.7 mg/dL — ABNORMAL LOW (ref 8.9–10.3)
Chloride: 103 mmol/L (ref 98–111)
Creatinine, Ser: 1.11 mg/dL (ref 0.61–1.24)
GFR, Estimated: >60 mL/min (ref >60)
Glucose, Bld: 130 mg/dL — ABNORMAL HIGH (ref 70–99)
Potassium: 4.2 mmol/L (ref 3.5–5.1)
Sodium: 135 mmol/L (ref 135–145)

## 2021-06-01 MED ORDER — POTASSIUM CHLORIDE CRYS ER 10 MEQ PO TBCR
10.0000 meq | EXTENDED_RELEASE_TABLET | Freq: Every day | ORAL | 0 refills | Status: DC
  Filled 2021-06-01: qty 30, 30d supply, fill #0

## 2021-06-01 MED ORDER — HYDRALAZINE HCL 50 MG PO TABS
50.0000 mg | ORAL_TABLET | Freq: Three times a day (TID) | ORAL | 0 refills | Status: DC
  Filled 2021-06-01: qty 90, 30d supply, fill #0

## 2021-06-01 MED ORDER — CHLORTHALIDONE 25 MG PO TABS
25.0000 mg | ORAL_TABLET | Freq: Every day | ORAL | 0 refills | Status: DC
  Filled 2021-06-01: qty 30, 30d supply, fill #0

## 2021-06-01 MED ORDER — CARVEDILOL 6.25 MG PO TABS
6.2500 mg | ORAL_TABLET | Freq: Two times a day (BID) | ORAL | 0 refills | Status: DC
  Filled 2021-06-01: qty 60, 30d supply, fill #0

## 2021-06-01 MED ORDER — ISOSORBIDE MONONITRATE ER 30 MG PO TB24
30.0000 mg | ORAL_TABLET | Freq: Every day | ORAL | 0 refills | Status: DC
  Filled 2021-06-01: qty 30, 30d supply, fill #0

## 2021-06-01 MED ORDER — CHLORTHALIDONE 25 MG PO TABS
25.0000 mg | ORAL_TABLET | Freq: Every day | ORAL | Status: DC
  Administered 2021-06-01 – 2021-06-03 (×3): 25 mg via ORAL
  Filled 2021-06-01 (×3): qty 1

## 2021-06-01 MED ORDER — ISOSORBIDE MONONITRATE ER 30 MG PO TB24
30.0000 mg | ORAL_TABLET | Freq: Every day | ORAL | Status: DC
  Administered 2021-06-01 – 2021-06-03 (×3): 30 mg via ORAL
  Filled 2021-06-01 (×3): qty 1

## 2021-06-01 MED ORDER — LOSARTAN POTASSIUM 25 MG PO TABS
25.0000 mg | ORAL_TABLET | Freq: Every day | ORAL | Status: DC
  Administered 2021-06-01 – 2021-06-03 (×3): 25 mg via ORAL
  Filled 2021-06-01 (×3): qty 1

## 2021-06-01 MED ORDER — LOSARTAN POTASSIUM 25 MG PO TABS
25.0000 mg | ORAL_TABLET | Freq: Every day | ORAL | 0 refills | Status: DC
  Filled 2021-06-01: qty 30, 30d supply, fill #0

## 2021-06-01 NOTE — Plan of Care (Signed)
°  Problem: Education: Goal: Knowledge of General Education information will improve Description: Including pain rating scale, medication(s)/side effects and non-pharmacologic comfort measures Outcome: Progressing   Problem: Clinical Measurements: Goal: Will remain free from infection Outcome: Progressing Goal: Diagnostic test results will improve Outcome: Progressing Goal: Respiratory complications will improve Outcome: Progressing Goal: Cardiovascular complication will be avoided Outcome: Progressing   Problem: Activity: Goal: Risk for activity intolerance will decrease Outcome: Progressing   Problem: Nutrition: Goal: Adequate nutrition will be maintained Outcome: Progressing   Problem: Elimination: Goal: Will not experience complications related to bowel motility Outcome: Progressing Goal: Will not experience complications related to urinary retention Outcome: Progressing   Problem: Pain Managment: Goal: General experience of comfort will improve Outcome: Progressing   Problem: Skin Integrity: Goal: Risk for impaired skin integrity will decrease Outcome: Progressing   Problem: Activity: Goal: Ability to tolerate increased activity will improve Outcome: Progressing   Problem: Clinical Measurements: Goal: Ability to maintain a body temperature in the normal range will improve Outcome: Progressing   Problem: Respiratory: Goal: Ability to maintain adequate ventilation will improve Outcome: Progressing Goal: Ability to maintain a clear airway will improve Outcome: Progressing   Problem: Activity: Goal: Ability to tolerate increased activity will improve Outcome: Progressing   Problem: Respiratory: Goal: Ability to maintain a clear airway will improve Outcome: Progressing Goal: Levels of oxygenation will improve Outcome: Progressing Goal: Ability to maintain adequate ventilation will improve Outcome: Progressing

## 2021-06-01 NOTE — Plan of Care (Signed)
  Problem: Education: Goal: Knowledge of General Education information will improve Description: Including pain rating scale, medication(s)/side effects and non-pharmacologic comfort measures Outcome: Progressing   Problem: Health Behavior/Discharge Planning: Goal: Ability to manage health-related needs will improve Outcome: Progressing   Problem: Clinical Measurements: Goal: Ability to maintain clinical measurements within normal limits will improve Outcome: Progressing Goal: Will remain free from infection Outcome: Progressing Goal: Diagnostic test results will improve Outcome: Progressing Goal: Respiratory complications will improve Outcome: Progressing Goal: Cardiovascular complication will be avoided Outcome: Progressing   Problem: Activity: Goal: Risk for activity intolerance will decrease Outcome: Progressing   Problem: Nutrition: Goal: Adequate nutrition will be maintained Outcome: Progressing   Problem: Coping: Goal: Level of anxiety will decrease Outcome: Progressing   Problem: Elimination: Goal: Will not experience complications related to bowel motility Outcome: Progressing Goal: Will not experience complications related to urinary retention Outcome: Progressing   Problem: Pain Managment: Goal: General experience of comfort will improve Outcome: Progressing   Problem: Safety: Goal: Ability to remain free from injury will improve Outcome: Progressing   Problem: Skin Integrity: Goal: Risk for impaired skin integrity will decrease Outcome: Progressing   Problem: Activity: Goal: Ability to tolerate increased activity will improve Outcome: Progressing   Problem: Clinical Measurements: Goal: Ability to maintain a body temperature in the normal range will improve Outcome: Progressing   Problem: Respiratory: Goal: Ability to maintain adequate ventilation will improve Outcome: Progressing Goal: Ability to maintain a clear airway will improve Outcome:  Progressing   Problem: Education: Goal: Knowledge of disease or condition will improve Outcome: Progressing Goal: Knowledge of the prescribed therapeutic regimen will improve Outcome: Progressing Goal: Individualized Educational Video(s) Outcome: Progressing   Problem: Activity: Goal: Ability to tolerate increased activity will improve Outcome: Progressing Goal: Will verbalize the importance of balancing activity with adequate rest periods Outcome: Progressing   Problem: Respiratory: Goal: Ability to maintain a clear airway will improve Outcome: Progressing Goal: Levels of oxygenation will improve Outcome: Progressing Goal: Ability to maintain adequate ventilation will improve Outcome: Progressing   

## 2021-06-01 NOTE — Telephone Encounter (Signed)
Called and spoke with patient niece (POA). Patient is still currently admitted in the hospital.

## 2021-06-01 NOTE — TOC Progression Note (Signed)
Transition of Care [facility]) - Progression Note    Patient Details  Name: Charles [name] MRN: [id number] Date of Birth: 08/12/1970  Transition of Care [facility]) CM/SW [place], [place] Phone Number: 06/01/2021, 1:01 PM  Clinical Narrative:    CSW followed up with pt's niece who had noted on initial assessment that she would be working on finding somewhere for him to go, as he is currently homeless. Niece requested further resources for finding him somewhere to go. CSW informed Niece that at this time placement options for the homeless community are limited and without family support pt would likely continue to be homeless. Niece stated that she would [place] him up at [place], and family would figure out where he would go from there. TOC will continue to follow for any further needs.     Barriers to Discharge: Continued Medical Work up  Expected Discharge Plan and Services   In-house Referral: NA Discharge Planning Services: CM Consult Post Acute Care Choice: NA Living arrangements for the past 2 months: Homeless                 DME Arranged: N/A DME Agency: NA       HH Arranged: NA HH Agency: NA         Social Determinants of Health (SDOH) Interventions    Readmission Risk Interventions Readmission Risk Prevention Plan 05/28/2021  Transportation Screening Complete  PCP or Specialist Appt within 3-5 Days Complete  HRI or Interior Complete  Social Work Consult for [place] Planning/Counseling Complete  Palliative Care Screening Not Applicable  Medication Review Press photographer) Referral to Pharmacy  Some recent data might be hidden

## 2021-06-01 NOTE — Progress Notes (Signed)
[facility] Progress Note  Patient: Charles [name]    ZDG:[id number]  DOA: 05/26/2021    Date of Service: the patient was seen and examined on 06/01/2021  Brief hospital course: 51 year old homeless male past medical history COPD, chronic diastolic heart failure hypertension, polysubstance abuse admitted on 1/14 for 2 weeks of progressively worsening shortness of breath and found to have hypoxia secondary to COPD exacerbation, multilobar pneumonia and acute on chronic CHF along with hypertensive urgency.  Following admission, patient's blood cultures grew out positive for Acinetobacter and echocardiogram noted ejection fraction of 30 to 35% with ejection fraction normal 1 year prior.  Patient treated with steroids, antibiotics, nebulizers, supplemental oxygen and diuretics.  Cardiology and infectious disease consulted.  Patient clinically improving.  Will need to be on IV Unasyn until Saturday, 1/21.  Assessment and Plan: Acute on chronic systolic and diastolic CHF (congestive heart failure) (HCC) Assessment & Plan Echocardiogram noted ejection fraction of 30-35% Patient underwent cardiac MRI and findings consistent with hypertensive heart disease.  Discussions were further for heart cath versus coronary CTA, however given that he is clinically much improved with diuresis and chest pain-free, plan is to follow-up with cardiology as outpatient Discussed with Dr Jacinto Halim, changed meds to chlorthalidone, losartan, continue hydralazine, Imdur, Coreg.  DC Entresto, Lasix, farxiga Outpatient follow-up with cardiology  Hypertensive urgency Assessment & Plan Secondary to cocaine use plus noncompliant medications Med changes as above  COPD with acute exacerbation (HCC) Rhinovirus/enterovirus Assessment & Plan Much improved.  Off of oxygen.  Last dose of steroids on 1/20 Counseled on smoking cessation  Multifocal pneumonia Assessment & Plan Sepsis ruled out.  COVID and flu negative Finished  5-day Zithromax course on 1/18  Acute respiratory failure with hypoxia [facility]) Assessment & Plan Multifactorial secondary to CHF, COPD exacerbation and pneumonia.  Resolved, now on room air.  Bacteremia  Blood cultures positive for Acinetobacter, repeat BC x 2 NGTD Patient initially placed on cefepime and by ID recommendations, changed to IV Unasyn until 1/22    Thrombocytopenia (HCC) Assessment & Plan Chronic issue.  Stable.  Hyperglycemia Assessment & Plan Prediabetic.  High sugar secondary to steroids.  A1c at 6.1.  Homelessness Assessment & Plan Patient states he will be staying with his sister/niece upon discharge.  Polysubstance abuse (HCC) Assessment & Plan Urine drug screen positive for cocaine.  Counseled.  Has been given resources, although he is honest and states that he will not stop using.   Overweight: Body mass index is 25.98 kg/m.        Consultants: Cardiology Infectious disease  Procedures: 2D echocardiogram: Ejection fraction of 30-35%.  Significant decrease from 1 year ago.  Antimicrobials: Zithromax 1/14-1/18 IV cefepime 1/15-1/16 IV Unasyn 1/16-1/21  Code Status: Full code   Subjective: Patient denies any new complaints, wants more food.  Objective: Vital signs were reviewed and unremarkable. Vitals:   06/01/21 1555 06/01/21 1928  BP: (!) 150/68 94/68  Pulse:    Resp: 19 19  Temp: 98.3 F (36.8 C) 98.1 F (36.7 C)  SpO2: 100%     Intake/Output Summary (Last 24 hours) at 06/01/2021 1931 Last data filed at 06/01/2021 1555 Gross per 24 hour  Intake 2640 ml  Output 2225 ml  Net 415 ml   Filed Weights   05/30/21 0600 05/31/21 0355 06/01/21 0609  Weight: 72.1 kg 71.8 kg 73 kg   Body mass index is 25.98 kg/m.  Exam: General: NAD  Cardiovascular: S1, S2 present Respiratory: Decreased breath sounds bilaterally Abdomen:  Soft, nontender, nondistended, bowel sounds present Musculoskeletal: No bilateral pedal edema noted Skin:  Normal Psychiatry: Normal mood      Disposition:  Status is: Inpatient  Remains inpatient appropriate because: Completion of IV antibiotics on 1/22   Family Communication: None at bedside DVT Prophylaxis: SCDs Start: 05/26/21 0844 SCDs Start: 05/26/21 4431    Author: Briant Cedar ,MD 06/01/2021 7:31 PM  To reach On-call, see care teams to locate the attending and reach out via [email or web address]. Between 7PM-7AM, please contact night-coverage

## 2021-06-02 LAB — CULTURE, BLOOD (ROUTINE X 2)
Culture: NO GROWTH
Culture: NO GROWTH
Special Requests: ADEQUATE
Special Requests: ADEQUATE

## 2021-06-02 LAB — GLUCOSE, CAPILLARY
Glucose-Capillary: 114 mg/dL — ABNORMAL HIGH (ref 70–99)
Glucose-Capillary: 156 mg/dL — ABNORMAL HIGH (ref 70–99)
Glucose-Capillary: 97 mg/dL (ref 70–99)

## 2021-06-02 NOTE — Progress Notes (Signed)
Claimed that he took a shower in the bathroom and feels better.

## 2021-06-02 NOTE — Progress Notes (Signed)
[facility] Progress Note  Patient: Charles [name]    QIO:[id number]  DOA: 05/26/2021    Date of Service: the patient was seen and examined on 06/02/2021  Brief hospital course: 51 year old homeless male past medical history COPD, chronic diastolic heart failure hypertension, polysubstance abuse admitted on 1/14 for 2 weeks of progressively worsening shortness of breath and found to have hypoxia secondary to COPD exacerbation, multilobar pneumonia and acute on chronic CHF along with hypertensive urgency.  Following admission, patient's blood cultures grew out positive for Acinetobacter and echocardiogram noted ejection fraction of 30 to 35% with ejection fraction normal 1 year prior.  Patient treated with steroids, antibiotics, nebulizers, supplemental oxygen and diuretics.  Cardiology and infectious disease consulted.  Patient clinically improving.  Will need to be on IV Unasyn until 1/22.  Assessment and Plan: Acute on chronic systolic and diastolic CHF (congestive heart failure) (HCC) Assessment & Plan Echocardiogram noted ejection fraction of 30-35% Patient underwent cardiac MRI and findings consistent with hypertensive heart disease.  Discussions were further for heart cath versus coronary CTA, however given that he is clinically much improved with diuresis and chest pain-free, plan is to follow-up with cardiology as outpatient Discussed with Dr Jacinto Halim, changed meds to chlorthalidone, losartan, continue hydralazine, Imdur, Coreg.  DC Entresto, Lasix, farxiga Outpatient follow-up with cardiology  Hypertensive urgency Assessment & Plan Secondary to cocaine use plus noncompliant medications Med changes as above  COPD with acute exacerbation (HCC) Rhinovirus/enterovirus Assessment & Plan Much improved.  Off of oxygen.  Last dose of steroids on 1/20 Counseled on smoking cessation  Multifocal pneumonia Assessment & Plan Sepsis ruled out.  COVID and flu negative Finished 5-day  Zithromax course on 1/18  Acute respiratory failure with hypoxia [facility]) Assessment & Plan Multifactorial secondary to CHF, COPD exacerbation and pneumonia.  Resolved, now on room air.  Bacteremia  Blood cultures positive for Acinetobacter, repeat BC x 2 NGTD Patient initially placed on cefepime and by ID recommendations, changed to IV Unasyn until 1/22    Thrombocytopenia (HCC) Assessment & Plan Chronic issue.  Stable.  Hyperglycemia Assessment & Plan Prediabetic.  High sugar secondary to steroids.  A1c at 6.1.  Homelessness Assessment & Plan Patient states he will be staying with his sister/niece upon discharge.  Polysubstance abuse (HCC) Assessment & Plan Urine drug screen positive for cocaine.  Counseled.  Has been given resources, although he is honest and states that he will not stop using.   Overweight: Body mass index is 25.8 kg/m.        Consultants: Cardiology Infectious disease  Procedures: 2D echocardiogram: Ejection fraction of 30-35%.  Significant decrease from 1 year ago.  Antimicrobials: Zithromax 1/14-1/18 IV cefepime 1/15-1/16 IV Unasyn 1/16-1/21  Code Status: Full code   Subjective:  Patient denies any new complaints.  Objective: Vital signs were reviewed and unremarkable. Vitals:   06/02/21 1054 06/02/21 1649  BP:  (!) 156/98  Pulse: 87 84  Resp: 15   Temp: 98.2 F (36.8 C) 98 F (36.7 C)  SpO2: 100% 100%    Intake/Output Summary (Last 24 hours) at 06/02/2021 1814 Last data filed at 06/02/2021 1700 Gross per 24 hour  Intake 843 ml  Output 1800 ml  Net -957 ml   Filed Weights   05/31/21 0355 06/01/21 0609 06/02/21 0621  Weight: 71.8 kg 73 kg 72.5 kg   Body mass index is 25.8 kg/m.  Exam: General: NAD  Cardiovascular: S1, S2 present Respiratory: CTAB Abdomen: Soft, nontender, nondistended, bowel sounds present  Musculoskeletal: No bilateral pedal edema noted Skin: Normal Psychiatry: Normal mood      Disposition:   Status is: Inpatient  Remains inpatient appropriate because: Completion of IV antibiotics on 1/22   Family Communication: None at bedside DVT Prophylaxis: SCDs Start: 05/26/21 0844 SCDs Start: 05/26/21 3419    Author: Briant Cedar ,MD 06/02/2021 6:14 PM  To reach On-call, see care teams to locate the attending and reach out via [email or web address]. Between 7PM-7AM, please contact night-coverage

## 2021-06-03 LAB — BASIC METABOLIC PANEL
Anion gap: 6 (ref 5–15)
BUN: 24 mg/dL — ABNORMAL HIGH (ref 6–20)
CO2: 26 mmol/L (ref 22–32)
Calcium: 8.4 mg/dL — ABNORMAL LOW (ref 8.9–10.3)
Chloride: 102 mmol/L (ref 98–111)
Creatinine, Ser: 1.06 mg/dL (ref 0.61–1.24)
GFR, Estimated: >60 mL/min (ref >60)
Glucose, Bld: 97 mg/dL (ref 70–99)
Potassium: 4.3 mmol/L (ref 3.5–5.1)
Sodium: 134 mmol/L — ABNORMAL LOW (ref 135–145)

## 2021-06-03 LAB — CBC WITH DIFFERENTIAL/PLATELET
Abs Immature Granulocytes: 0.1 10*3/uL — ABNORMAL HIGH (ref 0.00–0.07)
Basophils Absolute: 0 10*3/uL (ref 0.0–0.1)
Basophils Relative: 0 %
Eosinophils Absolute: 0 10*3/uL (ref 0.0–0.5)
Eosinophils Relative: 1 %
HCT: 37.2 % — ABNORMAL LOW (ref 39.0–52.0)
Hemoglobin: 12.7 g/dL — ABNORMAL LOW (ref 13.0–17.0)
Immature Granulocytes: 2 %
Lymphocytes Relative: 40 %
Lymphs Abs: 1.8 10*3/uL (ref 0.7–4.0)
MCH: 35.4 pg — ABNORMAL HIGH (ref 26.0–34.0)
MCHC: 34.1 g/dL (ref 30.0–36.0)
MCV: 103.6 fL — ABNORMAL HIGH (ref 80.0–100.0)
Monocytes Absolute: 0.7 10*3/uL (ref 0.1–1.0)
Monocytes Relative: 14 %
Neutro Abs: 1.9 10*3/uL (ref 1.7–7.7)
Neutrophils Relative %: 43 %
Platelets: 110 10*3/uL — ABNORMAL LOW (ref 150–400)
RBC: 3.59 MIL/uL — ABNORMAL LOW (ref 4.22–5.81)
RDW: 16.9 % — ABNORMAL HIGH (ref 11.5–15.5)
WBC: 4.5 10*3/uL (ref 4.0–10.5)
nRBC: 0 % (ref 0.0–0.2)

## 2021-06-03 LAB — GLUCOSE, CAPILLARY
Glucose-Capillary: 93 mg/dL (ref 70–99)
Glucose-Capillary: 136 mg/dL — ABNORMAL HIGH (ref 70–99)

## 2021-06-03 MED ORDER — LEVOFLOXACIN 750 MG PO TABS
750.0000 mg | ORAL_TABLET | Freq: Once | ORAL | Status: AC
  Administered 2021-06-03: 750 mg via ORAL
  Filled 2021-06-03: qty 1

## 2021-06-03 NOTE — Discharge Summary (Signed)
Discharge Summary  Charles [name] [id number] DOB: Dec 13, 1970  PCP: Patient, No Pcp Per (Inactive)  Admit date: 05/26/2021 Discharge date: 06/03/2021  Time spent: 40 mins  Recommendations for Outpatient Follow-up:  Follow-up with cardiology as scheduled  Discharge Diagnoses:  Active Hospital Problems   Diagnosis Date Noted   Acute respiratory failure with hypoxia ([place]) 05/26/2021   Bacteremia 05/27/2021   Acute on chronic systolic and diastolic CHF (congestive heart failure) ([place]) 05/27/2021   Hyperglycemia 05/27/2021   Hypertensive urgency 05/26/2021   Polysubstance abuse ([place]) 05/26/2021   COPD with acute exacerbation ([place]) 05/26/2021   Homelessness 05/26/2021   Thrombocytopenia ([place]) 10/19/2014   Multifocal pneumonia 10/17/2014    Resolved Hospital Problems  No resolved problems to display.    Discharge Condition: Stable  Diet recommendation: Heart healthy  Vitals:   06/03/21 1050 06/03/21 1253  BP: (!) 146/80 119/60  Pulse:    Resp:    Temp: 98 F (36.7 C) 98.1 F (36.7 C)  SpO2:      History of present illness:  51 year old homeless male past medical history COPD, chronic diastolic heart failure hypertension, polysubstance abuse admitted on 1/14 for 2 weeks of progressively worsening shortness of breath and found to have hypoxia secondary to COPD exacerbation, multilobar pneumonia and acute on chronic CHF along with hypertensive urgency.  Following admission, patient's blood cultures grew out positive for Acinetobacter and echocardiogram noted ejection fraction of 30 to 35% with ejection fraction normal 1 year prior.  Patient treated with steroids, antibiotics, nebulizers, supplemental oxygen and diuretics.  Cardiology and infectious disease consulted.  Outpatient follow-up with cardiology.    Today, patient denies any new complaints.  Stable for discharge.  Outpatient follow-up with cardiology scheduled.  Family will be deciding where patient will be  staying, niece will pick up patient from hospital.    Hospital Course:  Principal Problem:   Acute respiratory failure with hypoxia (HCC) Active Problems:   Multifocal pneumonia   Thrombocytopenia (HCC)   Hypertensive urgency   Polysubstance abuse ([place])   COPD with acute exacerbation (HCC)   Homelessness   Bacteremia   Acute on chronic systolic and diastolic CHF (congestive heart failure) (HCC)   Hyperglycemia   Acute on chronic systolic and diastolic CHF (congestive heart failure) (HCC) Assessment & Plan Echocardiogram noted ejection fraction of 30-35% Patient underwent cardiac MRI and findings consistent with hypertensive heart disease.  Discussions were further for heart cath versus coronary CTA, however given that he is clinically much improved with diuresis and chest pain-free, plan is to follow-up with cardiology as outpatient Discussed with Dr Einar Gip, changed meds to chlorthalidone, losartan, continue hydralazine, Imdur, Coreg.  Discontinue Entresto, Lasix, farxiga Outpatient follow-up with cardiology   Hypertensive urgency Assessment & Plan Secondary to cocaine use plus noncompliant medications Med changes as above   COPD with acute exacerbation ([place]) Rhinovirus/enterovirus Assessment & Plan Much improved.  Off of oxygen.  Last dose of steroids on 1/20 Counseled on smoking cessation   Multifocal pneumonia Assessment & Plan Sepsis ruled out.  COVID and flu negative Finished 5-day Zithromax course on 1/18   Acute respiratory failure with hypoxia [facility]) Assessment & Plan Multifactorial secondary to CHF, COPD exacerbation and pneumonia.  Resolved, now on room air.   Bacteremia  Blood cultures positive for Acinetobacter, repeat BC x 2 NGTD Patient initially placed on cefepime and by ID recommendations, changed to IV Unasyn and completed antibiotics on 1/22     Thrombocytopenia [facility]) Assessment & Plan Chronic issue.  Stable.   Hyperglycemia Assessment &  Plan Prediabetic. A1c at 6.1 Outpatient follow up   [place] Patient states he will be staying with his sister/niece upon discharge.   Polysubstance abuse ([place]) Assessment & Plan Urine drug screen positive for cocaine.  Counseled.  Has been given resources, although he is honest and states that he will not stop using.     Estimated body mass index is 25.9 kg/m as calculated from the following:   Height as of this encounter: 5\' 6"  (1.676 m).   Weight as of this encounter: 72.8 kg.    Procedures: None  Consultations: Cardiology Infectious disease  Discharge Exam: BP 119/60 (BP Location: Right Arm)    Pulse 68    Temp 98.1 F (36.7 C) (Oral)    Resp 16    Ht 5\' 6"  (1.676 m)    Wt 72.8 kg    SpO2 94%    BMI 25.90 kg/m   General: NAD  Cardiovascular: S1, S2 present Respiratory: CTAB Abdomen: Soft, nontender, nondistended, bowel sounds present Musculoskeletal: No bilateral pedal edema noted Skin: Normal Psychiatry: Normal mood   Discharge Instructions You were cared for by a hospitalist during your hospital stay. If you have any questions about your discharge medications or the care you received while you were in the hospital after you are discharged, you can call the unit and asked to speak with the hospitalist on call if the hospitalist that took care of you is not available. Once you are discharged, your primary care physician will handle any further medical issues. Please note that NO REFILLS for any discharge medications will be authorized once you are discharged, as it is imperative that you return to your primary care physician (or establish a relationship with a primary care physician if you do not have one) for your aftercare needs so that they can reassess your need for medications and monitor your lab values.   Allergies as of 06/03/2021       Reactions   Eggs Or Egg-derived Products Nausea And Vomiting, Swelling   Banana Nausea And Vomiting    Pork-derived Products Nausea And Vomiting        Medication List     STOP taking these medications    amLODipine 10 MG tablet Commonly known as: NORVASC   amoxicillin 500 MG capsule Commonly known as: AMOXIL   benzonatate 100 MG capsule Commonly known as: TESSALON   GOODY HEADACHE PO   valsartan-hydrochlorothiazide 80-12.5 MG tablet Commonly known as: DIOVAN-HCT       TAKE these medications    carvedilol 6.25 MG tablet Commonly known as: COREG Take 1 tablet (6.25 mg total) by mouth 2 (two) times daily with a meal.   chlorthalidone 25 MG tablet Commonly known as: HYGROTON Take 1 tablet (25 mg total) by mouth daily.   hydrALAZINE 50 MG tablet Commonly known as: APRESOLINE Take 1 tablet (50 mg total) by mouth 3 (three) times daily.   isosorbide mononitrate 30 MG 24 hr tablet Commonly known as: IMDUR Take 1 tablet (30 mg total) by mouth daily.   losartan 25 MG tablet Commonly known as: COZAAR Take 1 tablet (25 mg total) by mouth daily.   potassium chloride 10 MEQ tablet Commonly known as: KLOR-CON M Take 1 tablet (10 mEq total) by mouth daily.       Allergies  Allergen Reactions   Eggs Or Egg-Derived Products Nausea And Vomiting and Swelling   Banana Nausea And Vomiting   Pork-Derived  Products Nausea And Vomiting    Follow-up Information     Lawerance Cruel C, PA-C Follow up on 06/13/2021.   Specialty: Cardiology Why: Appointment at 1:00 PM.  Please arrive 15 minutes prior to appointment time and bring all medications with you Contact information: [address] [place] [address] [phone]                  The results of significant diagnostics from this hospitalization (including imaging, microbiology, ancillary and laboratory) are listed below for reference.    Significant Diagnostic Studies: DG Chest 2 View  Result Date: 05/13/2021 CLINICAL DATA:  cough, SHOB EXAM: CHEST - 2 VIEW COMPARISON:  Chest x-ray 04/10/2021,  CT chest 06/22/2019. chest x-ray 08/02/2016 FINDINGS: The heart and mediastinal contours are unchanged. Aortic calcification. No focal consolidation. No pulmonary edema. No pleural effusion. No pneumothorax. Redemonstration of severe degenerative changes of the right shoulder with underlying chronic lesion not excluded. No acute osseous abnormality. Vascular coilling noted within the left upper abdominal quadrant. IMPRESSION: 1. No active cardiopulmonary disease. 2. Chronic severe degenerative changes of the right shoulder with underlying chronic lesion not excluded. Consider non-emergent dedicated right shoulder radiograph. Electronically Signed   By: Iven Finn M.D.   On: 05/13/2021 23:21   DG Chest Port 1 View  Result Date: 05/26/2021 CLINICAL DATA:  51 year old male with history of shortness of breath. EXAM: PORTABLE CHEST 1 VIEW COMPARISON:  Chest x-ray 05/13/2021. FINDINGS: Lung volumes are normal. Widespread but patchy asymmetrically distributed areas of interstitial prominence, peribronchial cuffing and poorly defined opacities are noted in the lungs bilaterally (right greater than left). Relative sparing of the left upper lobe. No pneumothorax. No pleural effusions. No definite cephalization of the pulmonary vasculature. Mild-to-moderate cardiomegaly. The patient is rotated to the right on today's exam, resulting in distortion of the mediastinal contours and reduced diagnostic sensitivity and specificity for mediastinal pathology. IMPRESSION: 1. The appearance the chest suggests severe multilobar bilateral bronchopneumonia (right greater than left). 2. Mild-to-moderate cardiomegaly. Electronically Signed   By: Vinnie Langton M.D.   On: 05/26/2021 05:04   MR CARDIAC MORPHOLOGY W WO CONTRAST  Result Date: 05/29/2021 EXAM: CARDIAC MRI TECHNIQUE: The patient was scanned on a 1.5 Tesla GE magnet. A dedicated cardiac coil was used. Functional imaging was done using Fiesta sequences. 2,3, and 4  chamber views were done to assess for RWMA's. Modified Simpson's rule using a short axis stack was used to calculate an ejection fraction on a dedicated work Conservation officer, nature. The patient received 10 cc of Gadavist. After 10 minutes inversion recovery sequences were used to assess for infiltration and scar tissue. CONTRAST:  Gadavist 10 cc FINDINGS: Limited images of the lung fields showed no gross abnormalities. Normal left ventricular size with moderate concentric LV hypertrophy, EF 28% with diffuse hypokinesis. The trabeculation pattern does not fit criteria for noncompaction. Normal right ventricular size with EF 46% (normal). Normal left and right atrial sizes. Trivial mitral regurgitation. Trileaflet aortic valve with no significant stenosis or regurgitation. On delayed enhancement imaging, there was rather subtle mid-wall late gadolinium enhancement primarily in the septum. MEASUREMENTS: MEASUREMENTS LVEDV 160 mL LVSV 44 mL LVEF 28% RVEDV 145 mL RVSV 66 mL RVEF 46% T2 47 (septum) T1 1067, ECV 30% IMPRESSION: 1. Normal LV size with moderate concentric LV hypertrophy. EF 28% with diffuse hypokinesis. He does not have hypertrabeculation consistent with noncompaction. 2.  Normal RV size and systolic function. 3. Rather subtle mid-wall LGE in the  septum. This is not a coronary disease pattern. 4.  Normal T2 values. 5.  Mildlly elevated extracellular volume percentage at 30%. This could be consistent with long-standing hypertension (borderline ECV elevation, very subtle non-coronary LGE). Does not look like hypertrophic cardiomyopathy but cannot rule out. [place] Mclean Electronically Signed   By: Loralie Champagne M.D.   On: 05/29/2021 15:17   ECHOCARDIOGRAM COMPLETE  Result Date: 05/28/2021    ECHOCARDIOGRAM REPORT   Patient Name:   Charles [name] Date of Exam: 05/28/2021 Medical Rec #:  [id number]       Height:       66.0 in Accession #:    [id number]      Weight:       155.0 lb Date of Birth:   [birth year]       BSA:          1.794 m Patient Age:    49 years        BP:           167/112 mmHg Patient Gender: M               HR:           81 bpm. Exam Location:  Inpatient Procedure: 2D Echo Indications:    Cardiomegaly  History:        Patient has prior history of Echocardiogram examinations, most                 recent 06/07/2020. COPD.  Sonographer:    Jefferey Pica Referring Phys: [id number] RONDELL A SMITH IMPRESSIONS  1. Coarse apical trabeculation with deep recessess. 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 Left ventricular ejection fraction, by estimation, is 30 to 35% - 31.8% by 2D MOD. The left ventricle has moderately decreased function. The left ventricle demonstrates global hypokinesis. There is moderate left ventricular hypertrophy. Left ventricular diastolic parameters are consistent with Grade I diastolic dysfunction (impaired relaxation).  2. Right ventricular systolic function is normal. The right ventricular size is normal.  3. Left atrial size was mildly dilated.  4. The pericardial effusion is anterior to the right ventricle.  5. The mitral valve is abnormal. Trivial mitral valve regurgitation.  6. The aortic valve is tricuspid. Aortic valve regurgitation is not visualized.  7. The inferior vena cava is normal in size with greater than 50% respiratory variability, suggesting right atrial pressure of 3 mmHg. Comparison(s): Changes from prior study are noted. 06/07/2020: LVEF 60-65%, prominent apical trabeculae. Conclusion(s)/Recommendation(s): Findings concerning for possible LV non-compaction cardiomyopathy, would recommend Cardiac MRI for clarification. FINDINGS  Left Ventricle: Coarse apical trabeculation with deep recessess. 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 Left ventricular ejection fraction, by estimation, is 30 to 35%. The left ventricle has moderately decreased function. The left ventricle demonstrates global hypokinesis. The left ventricular internal cavity size was  normal in size. There is moderate left ventricular hypertrophy. Left ventricular diastolic parameters are consistent with Grade I diastolic dysfunction (impaired relaxation). Indeterminate filling pressures. Right Ventricle: The right ventricular size is normal. No increase in right ventricular wall thickness. Right ventricular systolic function is normal. Left Atrium: Left atrial size was mildly dilated. Right Atrium: Right atrial size was normal in size. Pericardium: Trivial pericardial effusion is present. The pericardial effusion is anterior to the right ventricle. Mitral Valve: The mitral valve is abnormal. There is mild thickening of the mitral valve leaflet(s). Trivial mitral valve regurgitation. Tricuspid Valve: The tricuspid valve is grossly normal. Tricuspid valve regurgitation is trivial. Aortic Valve: The aortic valve is  tricuspid. Aortic valve regurgitation is not visualized. Aortic valve peak gradient measures 8.2 mmHg. Pulmonic Valve: The pulmonic valve was normal in structure. Pulmonic valve regurgitation is not visualized. Aorta: The aortic root and ascending aorta are structurally normal, with no evidence of dilitation. Venous: The inferior vena cava is normal in size with greater than 50% respiratory variability, suggesting right atrial pressure of 3 mmHg. IAS/Shunts: No atrial level shunt detected by color flow Doppler.  LEFT VENTRICLE PLAX 2D LVIDd:         4.60 cm      Diastology LVIDs:         4.00 cm      LV e' medial:    4.81 cm/s LV PW:         1.60 cm      LV E/e' medial:  15.1 LV IVS:        1.20 cm      LV e' lateral:   5.63 cm/s LVOT diam:     2.10 cm      LV E/e' lateral: 12.9 LV SV:         67 LV SV Index:   37 LVOT Area:     3.46 cm  LV Volumes (MOD) LV vol d, MOD A2C: 195.6 ml LV vol d, MOD A4C: 146.2 ml LV vol s, MOD A4C: 99.7 ml LV SV MOD A4C:     146.2 ml RIGHT VENTRICLE             IVC RV Basal diam:  2.60 cm     IVC diam: 1.10 cm RV S prime:     13.00 cm/s TAPSE (M-mode): 1.8  cm LEFT ATRIUM             Index        RIGHT ATRIUM           Index LA diam:        3.70 cm 2.06 cm/m   RA Area:     15.60 cm LA Vol (A2C):   55.1 ml 30.71 ml/m  RA Volume:   40.20 ml  22.40 ml/m LA Vol (A4C):   57.2 ml 31.88 ml/m LA Biplane Vol: 62.2 ml 34.66 ml/m  AORTIC VALVE                 PULMONIC VALVE AV Area (Vmax): 3.17 cm     PV Vmax:       0.71 m/s AV Vmax:        143.00 cm/s  PV Peak grad:  2.0 mmHg AV Peak Grad:   8.2 mmHg LVOT Vmax:      131.00 cm/s LVOT Vmean:     79.200 cm/s LVOT VTI:       0.194 m  AORTA Ao Root diam: 3.70 cm Ao Asc diam:  3.50 cm MITRAL VALVE MV Area (PHT): 3.99 cm    SHUNTS MV Decel Time: 190 msec    Systemic VTI:  0.19 m MV E velocity: 72.80 cm/s  Systemic Diam: 2.10 cm MV A velocity: 93.80 cm/s MV E/A ratio:  0.78 Lyman Bishop MD Electronically signed by Lyman Bishop MD Signature Date/Time: 05/28/2021/10:58:11 AM    Final     Microbiology: Recent Results (from the past 240 hour(s))  Resp Panel by RT-PCR (Flu A&B, Covid) Nasopharyngeal Swab     Status: None   Collection Time: 05/26/21  4:40 AM   Specimen: Nasopharyngeal Swab; Nasopharyngeal(NP) swabs in vial transport medium  Result Value Ref Range Status  SARS Coronavirus 2 by RT PCR NEGATIVE NEGATIVE Final    Comment: (NOTE) SARS-CoV-2 target nucleic acids are NOT DETECTED.  The SARS-CoV-2 RNA is generally detectable in upper respiratory specimens during the acute phase of infection. The lowest concentration of SARS-CoV-2 viral copies this assay can detect is 138 copies/mL. A negative result does not preclude SARS-Cov-2 infection and should not be used as the sole basis for treatment or other patient management decisions. A negative result may occur with  improper specimen collection/handling, submission of specimen other than nasopharyngeal swab, presence of viral mutation(s) within the areas targeted by this assay, and inadequate number of viral copies(<138 copies/mL). A negative result must  be combined with clinical observations, patient history, and epidemiological information. The expected result is Negative.  Fact Sheet for Patients:  EntrepreneurPulse.com.au  Fact Sheet for Healthcare Providers:  IncredibleEmployment.be  This test is no t yet approved or cleared by the [place] FDA and  has been authorized for detection and/or diagnosis of SARS-CoV-2 by FDA under an Emergency Use Authorization (EUA). This EUA will remain  in effect (meaning this test can be used) for the duration of the COVID-19 declaration under Section 564(b)(1) of the Act, 21 U.S.C.section 360bbb-3(b)(1), unless the authorization is terminated  or revoked sooner.       Influenza A by PCR NEGATIVE NEGATIVE Final   Influenza B by PCR NEGATIVE NEGATIVE Final    Comment: (NOTE) The Xpert Xpress SARS-CoV-2/FLU/RSV plus assay is intended as an aid in the diagnosis of influenza from Nasopharyngeal swab specimens and should not be used as a sole basis for treatment. Nasal washings and aspirates are unacceptable for Xpert Xpress SARS-CoV-2/FLU/RSV testing.  Fact Sheet for Patients: EntrepreneurPulse.com.au  Fact Sheet for Healthcare Providers: IncredibleEmployment.be  This test is not yet approved or cleared by the [place] FDA and has been authorized for detection and/or diagnosis of SARS-CoV-2 by FDA under an Emergency Use Authorization (EUA). This EUA will remain in effect (meaning this test can be used) for the duration of the COVID-19 declaration under Section 564(b)(1) of the Act, 21 U.S.C. section 360bbb-3(b)(1), unless the authorization is terminated or revoked.  Performed at [facility], [place] [address] [place], [place] [address]   Blood Culture (routine x 2)     Status: Abnormal   Collection Time: 05/26/21  5:09 AM   Specimen: BLOOD  Result Value Ref Range Status   Specimen Description BLOOD  SITE NOT SPECIFIED  Final   Special Requests   Final    BOTTLES DRAWN AEROBIC AND ANAEROBIC Blood Culture adequate volume   Culture  Setup Time   Final    GRAM NEGATIVE RODS IN BOTH AEROBIC AND ANAEROBIC BOTTLES CRITICAL VALUE NOTED.  VALUE IS CONSISTENT WITH PREVIOUSLY REPORTED AND CALLED VALUE.    Culture (A)  Final    ACINETOBACTER LWOFFII STAPHYLOCOCCUS COHNII LACTOBACILLUS SPECIES Standardized susceptibility testing for this organism is not available. Performed at [facility], [place] [address] [place], [place] [address]    Report Status 05/31/2021 FINAL  Final  Blood Culture (routine x 2)     Status: Abnormal (Preliminary result)   Collection Time: 05/26/21  5:14 AM   Specimen: BLOOD  Result Value Ref Range Status   Specimen Description BLOOD SITE NOT SPECIFIED  Final   Special Requests   Final    BOTTLES DRAWN AEROBIC AND ANAEROBIC Blood Culture adequate volume   Culture  Setup Time   Final    GRAM NEGATIVE RODS  AEROBIC BOTTLE ONLY CRITICAL RESULT CALLED TO, READ BACK BY AND VERIFIED WITH: PHARMD JAMES LEDFORD 05/27/21@1 :15 BY TW    Culture (A)  Final    PANTOEA AGGLOMERANS ACINETOBACTER LWOFFII Sent to [place] for further susceptibility testing. Performed at [facility], [place] [address], [place], [place] [address]    Report Status PENDING  Incomplete   Organism ID, Bacteria PANTOEA AGGLOMERANS  Final      Susceptibility   Pantoea agglomerans - MIC*    CEFAZOLIN <=4 SENSITIVE Sensitive     CEFEPIME <=0.12 SENSITIVE Sensitive     CEFTAZIDIME <=1 SENSITIVE Sensitive     CEFTRIAXONE <=0.25 SENSITIVE Sensitive     CIPROFLOXACIN <=0.25 SENSITIVE Sensitive     GENTAMICIN <=1 SENSITIVE Sensitive     IMIPENEM 0.5 SENSITIVE Sensitive     TRIMETH/SULFA <=20 SENSITIVE Sensitive     PIP/TAZO <=4 SENSITIVE Sensitive     * PANTOEA AGGLOMERANS  Blood Culture ID Panel (Reflexed)     Status: Abnormal   Collection Time: 05/26/21  5:14 AM  Result Value Ref Range  Status   Enterococcus faecalis NOT DETECTED NOT DETECTED Final   Enterococcus Faecium NOT DETECTED NOT DETECTED Final   Listeria monocytogenes NOT DETECTED NOT DETECTED Final   Staphylococcus species NOT DETECTED NOT DETECTED Final   Staphylococcus aureus (BCID) NOT DETECTED NOT DETECTED Final   Staphylococcus epidermidis NOT DETECTED NOT DETECTED Final   Staphylococcus lugdunensis NOT DETECTED NOT DETECTED Final   Streptococcus species NOT DETECTED NOT DETECTED Final   Streptococcus agalactiae NOT DETECTED NOT DETECTED Final   Streptococcus pneumoniae NOT DETECTED NOT DETECTED Final   Streptococcus pyogenes NOT DETECTED NOT DETECTED Final   A.calcoaceticus-baumannii NOT DETECTED NOT DETECTED Final   Bacteroides fragilis NOT DETECTED NOT DETECTED Final   Enterobacterales DETECTED (A) NOT DETECTED Final    Comment: Enterobacterales represent a large order of gram negative bacteria, not a single organism. Refer to culture for further identification. CRITICAL RESULT CALLED TO, READ BACK BY AND VERIFIED WITH: PHARMD JAMES LEDFORD 05/27/21@1 :15 BY TW    Enterobacter cloacae complex NOT DETECTED NOT DETECTED Final   Escherichia coli NOT DETECTED NOT DETECTED Final   Klebsiella aerogenes NOT DETECTED NOT DETECTED Final   Klebsiella oxytoca NOT DETECTED NOT DETECTED Final   Klebsiella pneumoniae NOT DETECTED NOT DETECTED Final   Proteus species NOT DETECTED NOT DETECTED Final   Salmonella species NOT DETECTED NOT DETECTED Final   Serratia marcescens NOT DETECTED NOT DETECTED Final   Haemophilus influenzae NOT DETECTED NOT DETECTED Final   Neisseria meningitidis NOT DETECTED NOT DETECTED Final   Pseudomonas aeruginosa NOT DETECTED NOT DETECTED Final   Stenotrophomonas maltophilia NOT DETECTED NOT DETECTED Final   Candida albicans NOT DETECTED NOT DETECTED Final   Candida auris NOT DETECTED NOT DETECTED Final   Candida glabrata NOT DETECTED NOT DETECTED Final   Candida krusei NOT DETECTED NOT  DETECTED Final   Candida parapsilosis NOT DETECTED NOT DETECTED Final   Candida tropicalis NOT DETECTED NOT DETECTED Final   Cryptococcus neoformans/gattii NOT DETECTED NOT DETECTED Final   CTX-M ESBL NOT DETECTED NOT DETECTED Final   Carbapenem resistance IMP NOT DETECTED NOT DETECTED Final   Carbapenem resistance KPC NOT DETECTED NOT DETECTED Final   Carbapenem resistance NDM NOT DETECTED NOT DETECTED Final   Carbapenem resist OXA 48 LIKE NOT DETECTED NOT DETECTED Final   Carbapenem resistance VIM NOT DETECTED NOT DETECTED Final    Comment: Performed at [facility], [address]  [place], [place] [address]  MRSA Next Gen by PCR, Nasal     Status: None   Collection Time: 05/26/21  6:30 PM   Specimen: Nasal Mucosa; Nasal Swab  Result Value Ref Range Status   MRSA by PCR Next Gen NOT DETECTED NOT DETECTED Final    Comment: (NOTE) The GeneXpert MRSA Assay (FDA approved for NASAL specimens only), is one component of a comprehensive MRSA colonization surveillance program. It is not intended to diagnose MRSA infection nor to guide or monitor treatment for MRSA infections. Test performance is not FDA approved in patients less than 60 years old. Performed at [facility], [place] [address], [place], [place] [address]   Urine Culture     Status: None   Collection Time: 05/27/21  6:10 AM   Specimen: Urine, Clean Catch  Result Value Ref Range Status   Specimen Description URINE, CLEAN CATCH  Final   Special Requests NONE  Final   Culture   Final    NO GROWTH Performed at [facility], [place] [address], [place], [place] [address]    Report Status 05/28/2021 FINAL  Final  Culture, blood (Routine X 2) w Reflex to ID Panel     Status: None   Collection Time: 05/28/21  2:56 PM   Specimen: BLOOD  Result Value Ref Range Status   Specimen Description BLOOD BLOOD LEFT FOREARM  Final   Special Requests   Final    BOTTLES DRAWN AEROBIC AND ANAEROBIC Blood Culture adequate volume    Culture   Final    NO GROWTH 5 DAYS Performed at [facility], [place] [address], [place], [place] [address]    Report Status 06/02/2021 FINAL  Final  Culture, blood (Routine X 2) w Reflex to ID Panel     Status: None   Collection Time: 05/28/21  2:57 PM   Specimen: BLOOD  Result Value Ref Range Status   Specimen Description BLOOD RIGHT ANTECUBITAL  Final   Special Requests   Final    BOTTLES DRAWN AEROBIC AND ANAEROBIC Blood Culture adequate volume   Culture   Final    NO GROWTH 5 DAYS Performed at [facility], [place] [address] [place],  [address]    Report Status 06/02/2021 FINAL  Final  Respiratory (~20 pathogens) panel by PCR     Status: Abnormal   Collection Time: 05/28/21  7:04 PM   Specimen: Nasopharyngeal Swab; Respiratory  Result Value Ref Range Status   Adenovirus NOT DETECTED NOT DETECTED Final   Coronavirus 229E NOT DETECTED NOT DETECTED Final    Comment: (NOTE) The Coronavirus on the Respiratory Panel, DOES NOT test for the novel  Coronavirus (2019 nCoV)    Coronavirus HKU1 NOT DETECTED NOT DETECTED Final   Coronavirus NL63 NOT DETECTED NOT DETECTED Final   Coronavirus OC43 NOT DETECTED NOT DETECTED Final   Metapneumovirus NOT DETECTED NOT DETECTED Final   Rhinovirus / Enterovirus DETECTED (A) NOT DETECTED Final   Influenza A NOT DETECTED NOT DETECTED Final   Influenza B NOT DETECTED NOT DETECTED Final   Parainfluenza Virus 1 NOT DETECTED NOT DETECTED Final   Parainfluenza Virus 2 NOT DETECTED NOT DETECTED Final   Parainfluenza Virus 3 NOT DETECTED NOT DETECTED Final   Parainfluenza Virus 4 NOT DETECTED NOT DETECTED Final   Respiratory Syncytial Virus NOT DETECTED NOT DETECTED Final   Bordetella pertussis NOT DETECTED NOT DETECTED Final   Bordetella Parapertussis NOT DETECTED NOT DETECTED Final   Chlamydophila  pneumoniae NOT DETECTED NOT DETECTED Final   Mycoplasma pneumoniae NOT DETECTED NOT DETECTED Final    Comment: Performed at [facility], [place] [address] [place], [place] [address]     Labs: Basic Metabolic Panel: Recent Labs  Lab 05/28/21 0009 05/28/21 0627 05/30/21 0832 06/01/21 0110 06/03/21 0419  NA 134* 137 137 135 134*  K 4.2 4.0 3.7 4.2 4.3  CL 101 102 101 103 102  CO2 26 26 27 26 26   GLUCOSE 165* 141* 134* 130* 97  BUN 23* 18 17 25* 24*  CREATININE 1.13 1.13 1.11 1.11 1.06  CALCIUM 8.9 9.0 8.5* 8.7* 8.4*   Liver Function Tests: No results for input(s): AST, ALT, ALKPHOS, BILITOT, PROT, ALBUMIN in the last 168 hours. No results for input(s): LIPASE, AMYLASE in the last 168 hours. No results for input(s): AMMONIA in the last 168 hours. CBC: Recent Labs  Lab 05/28/21 0009 05/31/21 1058 06/03/21 0419  WBC 11.9* 11.4* 4.5  NEUTROABS  --   --  1.9  HGB 12.3* 15.4 12.7*  HCT 37.1* 45.0 37.2*  MCV 106.9* 104.7* 103.6*  PLT 101* 135* 110*   Cardiac Enzymes: No results for input(s): CKTOTAL, CKMB, CKMBINDEX, TROPONINI in the last 168 hours. BNP: BNP (last 3 results) Recent Labs    06/06/20 2215 05/26/21 0444 05/30/21 0832  BNP 126.2* 802.1* 57.6    ProBNP (last 3 results) No results for input(s): PROBNP in the last 8760 hours.  CBG: Recent Labs  Lab 06/02/21 0621 06/02/21 1054 06/02/21 2139 06/03/21 0628 06/03/21 1046  GLUCAP 114* 156* 97 93 136*       Signed:  Alma Friendly, MD [facility] 06/03/2021, 1:10 PM

## 2021-06-03 NOTE — Progress Notes (Signed)
Patient discharge instructions reviewed with patient , verbalize understanding. TOC meds returned to patient from pharmacy. Patient via wheelchair to sister's waiting car.in stable condition. Patient will be staying with sister and niece.

## 2021-06-03 NOTE — Plan of Care (Signed)
  Problem: Education: Goal: Knowledge of General Education information will improve Description: Including pain rating scale, medication(s)/side effects and non-pharmacologic comfort measures Outcome: Progressing   Problem: Health Behavior/Discharge Planning: Goal: Ability to manage health-related needs will improve Outcome: Progressing   Problem: Clinical Measurements: Goal: Ability to maintain clinical measurements within normal limits will improve Outcome: Progressing Goal: Will remain free from infection Outcome: Progressing Goal: Diagnostic test results will improve Outcome: Progressing Goal: Respiratory complications will improve Outcome: Progressing Goal: Cardiovascular complication will be avoided Outcome: Progressing   Problem: Activity: Goal: Risk for activity intolerance will decrease Outcome: Progressing   Problem: Nutrition: Goal: Adequate nutrition will be maintained Outcome: Progressing   Problem: Coping: Goal: Level of anxiety will decrease Outcome: Progressing   Problem: Elimination: Goal: Will not experience complications related to bowel motility Outcome: Progressing Goal: Will not experience complications related to urinary retention Outcome: Progressing   Problem: Pain Managment: Goal: General experience of comfort will improve Outcome: Progressing   Problem: Safety: Goal: Ability to remain free from injury will improve Outcome: Progressing   Problem: Skin Integrity: Goal: Risk for impaired skin integrity will decrease Outcome: Progressing   Problem: Activity: Goal: Ability to tolerate increased activity will improve Outcome: Progressing   Problem: Clinical Measurements: Goal: Ability to maintain a body temperature in the normal range will improve Outcome: Progressing   Problem: Respiratory: Goal: Ability to maintain adequate ventilation will improve Outcome: Progressing Goal: Ability to maintain a clear airway will improve Outcome:  Progressing   Problem: Education: Goal: Knowledge of disease or condition will improve Outcome: Progressing Goal: Knowledge of the prescribed therapeutic regimen will improve Outcome: Progressing Goal: Individualized Educational Video(s) Outcome: Progressing   Problem: Activity: Goal: Ability to tolerate increased activity will improve Outcome: Progressing Goal: Will verbalize the importance of balancing activity with adequate rest periods Outcome: Progressing   Problem: Respiratory: Goal: Ability to maintain a clear airway will improve Outcome: Progressing Goal: Levels of oxygenation will improve Outcome: Progressing Goal: Ability to maintain adequate ventilation will improve Outcome: Progressing   

## 2021-06-04 LAB — GLUCOSE, CAPILLARY: Glucose-Capillary: 94 mg/dL (ref 70–99)

## 2021-06-07 LAB — SUSCEPTIBILITY, AER + ANAEROB

## 2021-06-07 LAB — SUSCEPTIBILITY RESULT

## 2021-06-11 LAB — CULTURE, BLOOD (ROUTINE X 2): Special Requests: ADEQUATE

## 2021-06-13 ENCOUNTER — Other Ambulatory Visit: Payer: Self-pay

## 2021-06-13 ENCOUNTER — Encounter: Payer: Self-pay | Admitting: Student

## 2021-06-13 ENCOUNTER — Ambulatory Visit: Payer: Medicare Other | Admitting: Student

## 2021-06-13 VITALS — BP 135/68 | HR 91 | Temp 98.4°F | Resp 16 | Ht 66.0 in | Wt 173.4 lb

## 2021-06-13 DIAGNOSIS — I10 Essential (primary) hypertension: Secondary | ICD-10-CM

## 2021-06-13 DIAGNOSIS — I5042 Chronic combined systolic (congestive) and diastolic (congestive) heart failure: Secondary | ICD-10-CM | POA: Diagnosis not present

## 2021-06-13 DIAGNOSIS — R0609 Other forms of dyspnea: Secondary | ICD-10-CM | POA: Diagnosis not present

## 2021-06-13 DIAGNOSIS — F1910 Other psychoactive substance abuse, uncomplicated: Secondary | ICD-10-CM

## 2021-06-13 MED ORDER — CARVEDILOL 25 MG PO TABS: 25.0000 mg | ORAL_TABLET | Freq: Two times a day (BID) | ORAL | 3 refills | Status: DC

## 2021-06-13 MED ORDER — POTASSIUM CHLORIDE CRYS ER 10 MEQ PO TBCR: 10.0000 meq | EXTENDED_RELEASE_TABLET | Freq: Every day | ORAL | 3 refills | Status: DC

## 2021-06-13 MED ORDER — CHLORTHALIDONE 25 MG PO TABS: 25.0000 mg | ORAL_TABLET | Freq: Every day | ORAL | 3 refills | Status: DC

## 2021-06-13 MED ORDER — HYDRALAZINE HCL 50 MG PO TABS: 50.0000 mg | ORAL_TABLET | Freq: Three times a day (TID) | ORAL | 3 refills | Status: DC

## 2021-06-13 MED ORDER — ISOSORBIDE MONONITRATE ER 30 MG PO TB24: 30.0000 mg | ORAL_TABLET | Freq: Every day | ORAL | 3 refills | Status: DC

## 2021-06-13 MED ORDER — LOSARTAN POTASSIUM 25 MG PO TABS: 25.0000 mg | ORAL_TABLET | Freq: Every day | ORAL | 3 refills | Status: DC
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
## 2021-06-28 ENCOUNTER — Ambulatory Visit: Payer: Medicare Other | Admitting: Student
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
## 2021-06-29 ENCOUNTER — Other Ambulatory Visit: Payer: Self-pay

## 2021-06-29 ENCOUNTER — Encounter: Payer: Self-pay | Admitting: Student

## 2021-06-29 ENCOUNTER — Ambulatory Visit: Payer: Medicare Other | Admitting: Student

## 2021-06-29 VITALS — BP 149/96 | HR 72 | Temp 98.4°F | Resp 16 | Ht 66.0 in | Wt 181.8 lb

## 2021-06-29 DIAGNOSIS — I10 Essential (primary) hypertension: Secondary | ICD-10-CM

## 2021-06-29 DIAGNOSIS — I5042 Chronic combined systolic (congestive) and diastolic (congestive) heart failure: Secondary | ICD-10-CM | POA: Diagnosis not present

## 2021-06-29 DIAGNOSIS — F1910 Other psychoactive substance abuse, uncomplicated: Secondary | ICD-10-CM

## 2021-06-29 MED ORDER — CARVEDILOL 12.5 MG PO TABS: 12.5000 mg | ORAL_TABLET | Freq: Two times a day (BID) | ORAL | 3 refills | Status: DC

## 2021-06-30 IMAGING — DX DG KNEE 1-2V*R*
2 series · 3 of 3 positions shown · non-contrast
Comparison: None

CLINICAL DATA: Medial knee pain question injury

EXAM:
RIGHT KNEE - 1-2 VIEW

[Series 1: knee ap · 0.14mm/px · 2 of 2 slices shown]
[im 1/2]
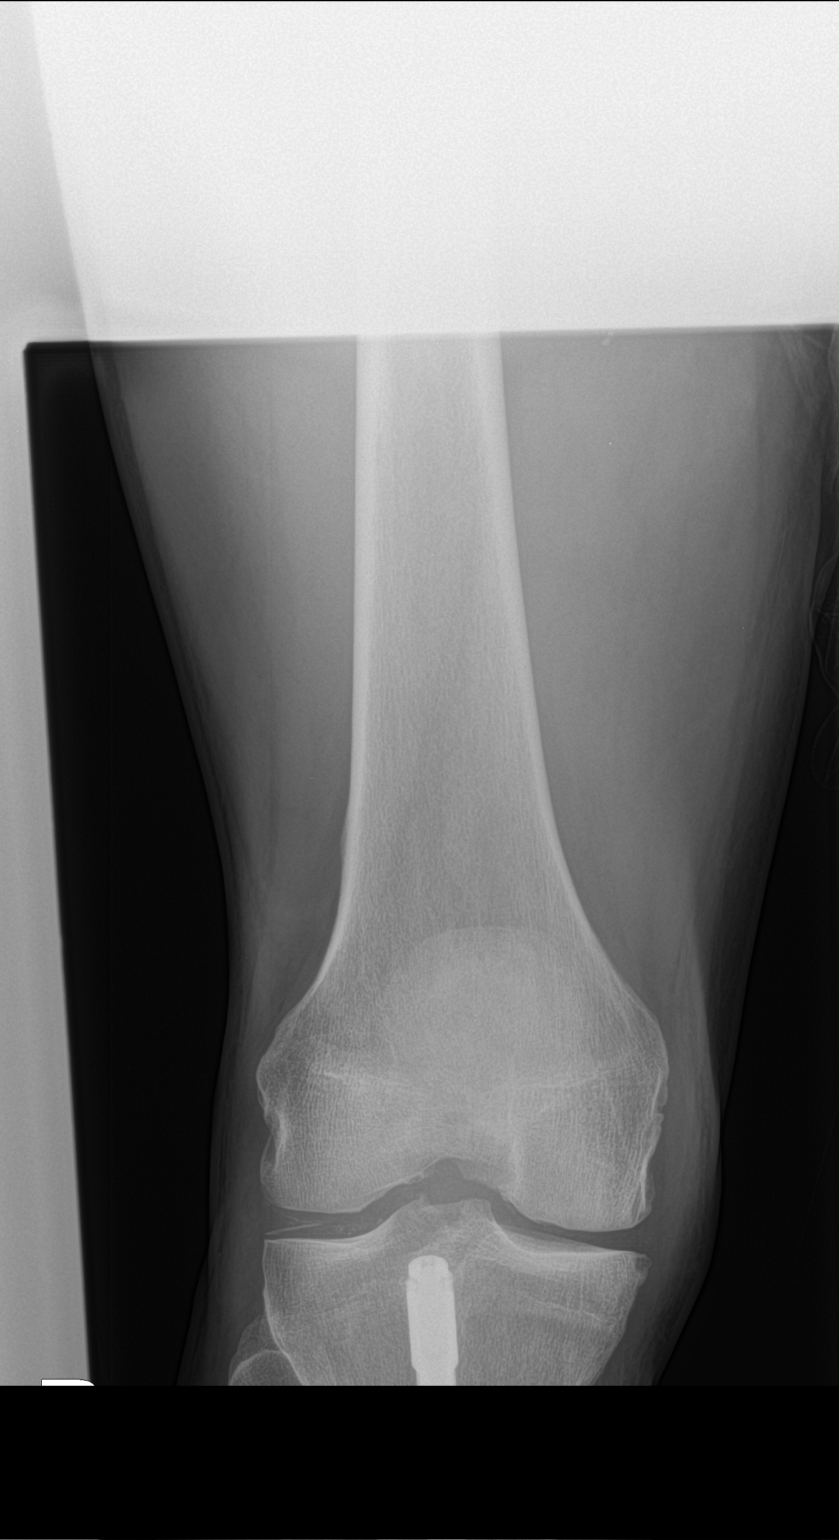
[im 2/2]
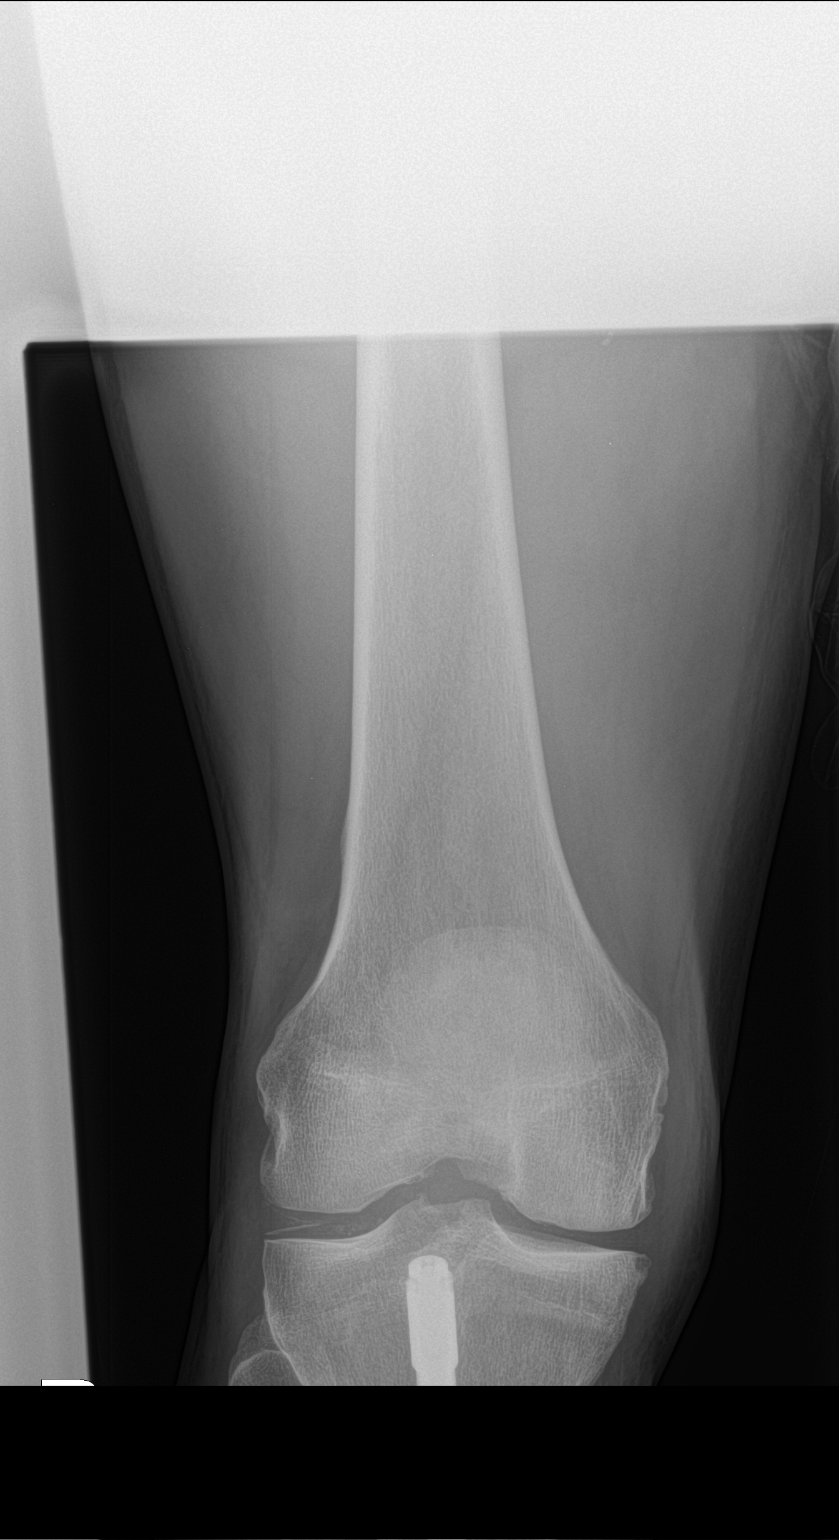

[knee lat]
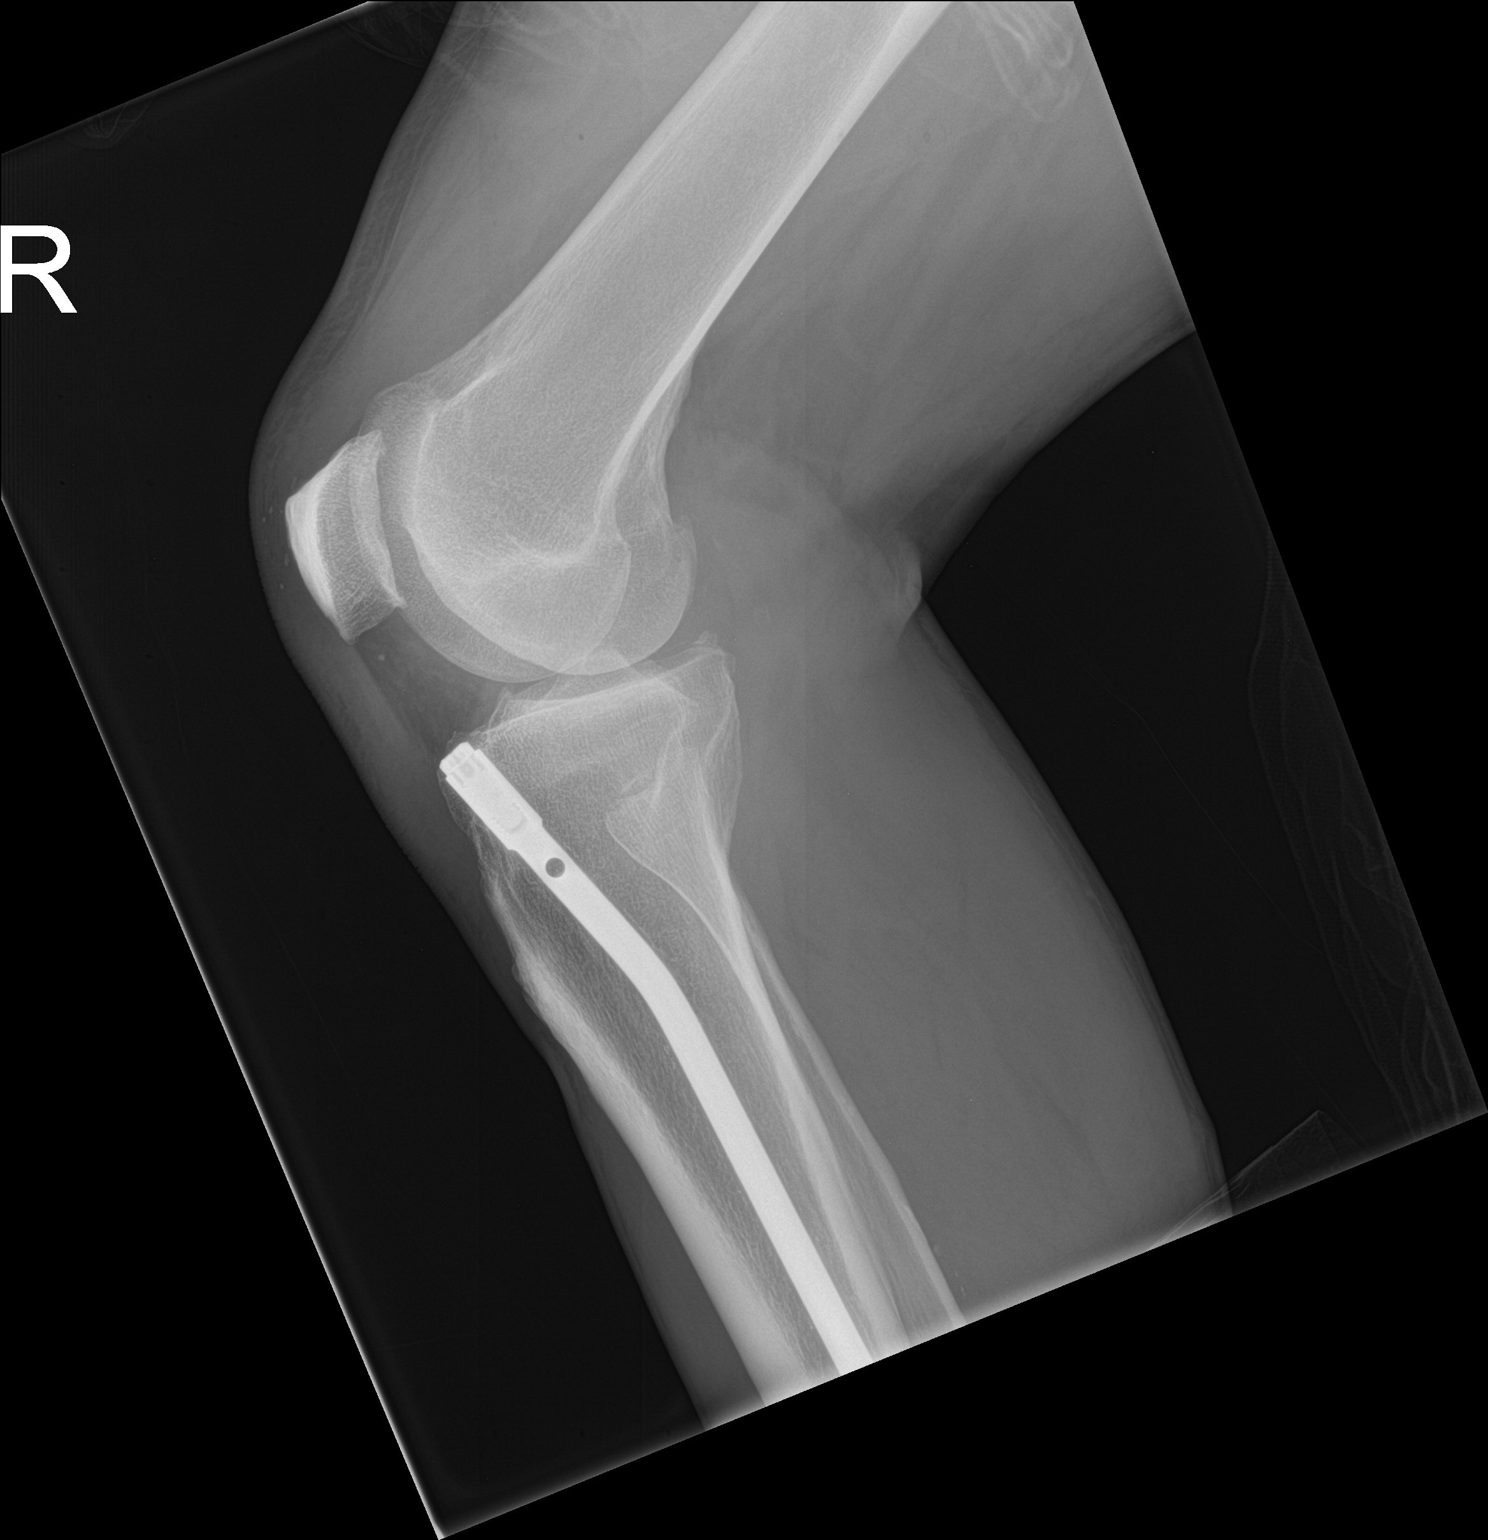

[3 of 3 positions shown; findings below may reference images not displayed]

FINDINGS: IM nail within the tibia.

Mild anterior soft tissue swelling.

Osseous mineralization normal.

Joint spaces preserved though tiny spurs are seen at the articular
margins of the patella.

No fracture, dislocation, bone destruction or definite joint
effusion.

Chondrocalcinosis question CPPD.
IMPRESSION: Minimal degenerative changes and question CPPD RIGHT knee.

## 2021-07-17 DIAGNOSIS — I5021 Acute systolic (congestive) heart failure: Secondary | ICD-10-CM | POA: Diagnosis not present
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
## 2021-07-27 ENCOUNTER — Ambulatory Visit: Payer: Medicare Other | Admitting: Student

## 2021-10-03 DIAGNOSIS — Z1152 Encounter for screening for COVID-19: Secondary | ICD-10-CM | POA: Diagnosis not present

## 2021-10-26 ENCOUNTER — Emergency Department (HOSPITAL_COMMUNITY): Payer: Medicare Other

## 2021-10-26 ENCOUNTER — Emergency Department (HOSPITAL_COMMUNITY)
Admission: EM | Admit: 2021-10-26 | Discharge: 2021-10-26 | Disposition: A | Discharge status: Home / Self Care | Payer: Medicare Other | Attending: Emergency Medicine | Admitting: Emergency Medicine

## 2021-10-26 ENCOUNTER — Other Ambulatory Visit: Payer: Self-pay

## 2021-10-26 ENCOUNTER — Encounter (HOSPITAL_COMMUNITY): Payer: Self-pay

## 2021-10-26 DIAGNOSIS — A64 Unspecified sexually transmitted disease: Secondary | ICD-10-CM | POA: Diagnosis not present

## 2021-10-26 DIAGNOSIS — D72819 Decreased white blood cell count, unspecified: Secondary | ICD-10-CM | POA: Diagnosis not present

## 2021-10-26 DIAGNOSIS — R369 Urethral discharge, unspecified: Secondary | ICD-10-CM | POA: Diagnosis not present

## 2021-10-26 DIAGNOSIS — R42 Dizziness and giddiness: Secondary | ICD-10-CM | POA: Diagnosis not present

## 2021-10-26 DIAGNOSIS — R9431 Abnormal electrocardiogram [ECG] [EKG]: Secondary | ICD-10-CM | POA: Diagnosis not present

## 2021-10-26 DIAGNOSIS — R531 Weakness: Secondary | ICD-10-CM | POA: Diagnosis not present

## 2021-10-26 DIAGNOSIS — I10 Essential (primary) hypertension: Secondary | ICD-10-CM | POA: Diagnosis not present

## 2021-10-26 DIAGNOSIS — R0602 Shortness of breath: Secondary | ICD-10-CM | POA: Diagnosis not present

## 2021-10-26 LAB — URINALYSIS, ROUTINE W REFLEX MICROSCOPIC
Bilirubin Urine: NEGATIVE
Glucose, UA: 50 mg/dL — AB
Hgb urine dipstick: NEGATIVE
Ketones, ur: NEGATIVE mg/dL
Nitrite: NEGATIVE
Protein, ur: 30 mg/dL — AB
Specific Gravity, Urine: 1.027 (ref 1.005–1.030)
WBC, UA: >50 WBC/hpf — ABNORMAL HIGH (ref 0–5)
pH: 5 (ref 5.0–8.0)

## 2021-10-26 LAB — BASIC METABOLIC PANEL
Anion gap: 9 (ref 5–15)
BUN: 16 mg/dL (ref 6–20)
CO2: 23 mmol/L (ref 22–32)
Calcium: 9 mg/dL (ref 8.9–10.3)
Chloride: 106 mmol/L (ref 98–111)
Creatinine, Ser: 1.48 mg/dL — ABNORMAL HIGH (ref 0.61–1.24)
GFR, Estimated: 57 mL/min — ABNORMAL LOW (ref >60)
Glucose, Bld: 139 mg/dL — ABNORMAL HIGH (ref 70–99)
Potassium: 4 mmol/L (ref 3.5–5.1)
Sodium: 138 mmol/L (ref 135–145)

## 2021-10-26 LAB — HEPATIC FUNCTION PANEL
ALT: 22 U/L (ref 0–44)
AST: 25 U/L (ref 15–41)
Albumin: 2.7 g/dL — ABNORMAL LOW (ref 3.5–5.0)
Alkaline Phosphatase: 64 U/L (ref 38–126)
Bilirubin, Direct: 0.2 mg/dL (ref 0.0–0.2)
Indirect Bilirubin: 0.5 mg/dL (ref 0.3–0.9)
Total Bilirubin: 0.7 mg/dL (ref 0.3–1.2)
Total Protein: 5.9 g/dL — ABNORMAL LOW (ref 6.5–8.1)

## 2021-10-26 LAB — CBC
HCT: 44 % (ref 39.0–52.0)
Hemoglobin: 15 g/dL (ref 13.0–17.0)
MCH: 35.1 pg — ABNORMAL HIGH (ref 26.0–34.0)
MCHC: 34.1 g/dL (ref 30.0–36.0)
MCV: 103 fL — ABNORMAL HIGH (ref 80.0–100.0)
Platelets: 113 10*3/uL — ABNORMAL LOW (ref 150–400)
RBC: 4.27 MIL/uL (ref 4.22–5.81)
RDW: 16.6 % — ABNORMAL HIGH (ref 11.5–15.5)
WBC: 3 10*3/uL — ABNORMAL LOW (ref 4.0–10.5)
nRBC: 0 % (ref 0.0–0.2)

## 2021-10-26 LAB — HIV ANTIBODY (ROUTINE TESTING W REFLEX): HIV Screen 4th Generation wRfx: NONREACTIVE

## 2021-10-26 LAB — TROPONIN I (HIGH SENSITIVITY)
Troponin I (High Sensitivity): 22 ng/L — ABNORMAL HIGH (ref <18)
Troponin I (High Sensitivity): 26 ng/L — ABNORMAL HIGH (ref <18)

## 2021-10-26 MED ORDER — SODIUM CHLORIDE 0.9 % IV SOLN
1.0000 g | Freq: Once | INTRAVENOUS | Status: AC
  Administered 2021-10-26: 1 g via INTRAVENOUS
  Filled 2021-10-26: qty 10

## 2021-10-26 MED ORDER — DOXYCYCLINE HYCLATE 100 MG PO TABS
100.0000 mg | ORAL_TABLET | Freq: Once | ORAL | Status: AC
  Administered 2021-10-26: 100 mg via ORAL
  Filled 2021-10-26: qty 1

## 2021-10-26 MED ORDER — LACTATED RINGERS IV BOLUS
500.0000 mL | Freq: Once | INTRAVENOUS | Status: AC
  Administered 2021-10-26: 500 mL via INTRAVENOUS

## 2021-10-26 MED ORDER — DOXYCYCLINE HYCLATE 100 MG PO CAPS: 100.0000 mg | ORAL_CAPSULE | Freq: Two times a day (BID) | ORAL | 0 refills | Status: AC

## 2021-10-26 NOTE — ED Notes (Signed)
RN reviewed discharge instructions with pt. Pt ambulatory upon time of discharge.

## 2021-10-26 NOTE — ED Triage Notes (Signed)
Patient complains of "everything wrong".  Patient complains of weakness, dizziness, shob admits to cocaine/alcohol use 2 days ago reports he has been sleeping for tues days.  Also complains of pus coming from penis.x 1 week.  Complains of heart going slow.  Patient eating chocolate in tirage.

## 2021-10-26 NOTE — ED Provider Triage Note (Signed)
Emergency Medicine Provider Triage Evaluation Note  Jaysun Wessels , a 51 y.o. male  was evaluated in triage.  Pt complains of multiple complaints today.  Patient notes that he is "leaking from his penis", generalized weakness.  Reports that he feels as if his heart is going slow.  No meds tried prior to arrival.  Denies abdominal pain, nausea, vomiting, fever, chills.  Denies past medical history of STIs.  Review of Systems  Positive: As per HPI above Negative:   Physical Exam  BP (!) 140/95 (BP Location: Left Arm)   Pulse 96   Temp 98.2 F (36.8 C) (Oral)   Resp 15   Ht 5\' 7"  (1.702 m)   Wt 79.4 kg   SpO2 98%   BMI 27.41 kg/m  Gen:   Awake, no distress   Resp:  Normal effort MSK:   Moves extremities without difficulty  Other:  No abdominal tenderness to palpation.  GU exam deferred in triage.  Medical Decision Making  Medically screening exam initiated at 12:58 PM.  Appropriate orders placed.  Mahkai Fangman was informed that the remainder of the evaluation will be completed by another provider, this initial triage assessment does not replace that evaluation, and the importance of remaining in the ED until their evaluation is complete.  Work-up initiated   Havanna Groner A, PA-C 10/26/21 1307

## 2021-10-26 NOTE — ED Notes (Signed)
Pt removed his own IV. Witnessed by nurse tech, nurse tech informed pt that he was being discharged and would come back to the room when pt was dressed. When tech reentered room pt was going through all the cabinets and drawers looking for socks that tech had previously provided. Tech left room again to allow pt to finish gathering his belongings. Pt emerged again from the room and stated that he could not believe he was not getting his prescription filled in the ED. Tech informed pt that he could get it filled at any pharmacy to which pt replied that he would just check back in to ED.

## 2021-10-26 NOTE — Discharge Instructions (Addendum)
You have likely been exposed to a sexually transmitted infection and will need antibiotics. You must refrain from having sex for the next 2 weeks as this is easily transmitted to others. Please continue the antibiotics as prescribed.

## 2021-10-26 NOTE — ED Provider Notes (Signed)
[facility] EMERGENCY DEPARTMENT Provider Note   CSN: [id number] Arrival date & time: 10/26/21  1155     History  Chief Complaint  Patient presents with   Penile Discharge    51 year old male with a past medical history of HFrEF (EF 28% in Jan 2023), untreated hepatitis C, chronic thrombocytopenia, history of aplastic anemia, tobacco and cocaine use, homelessness with medication noncompliance presents to the ED with 3 months of progressive shortness of breath and feeling like his heart is beating slow.  He states that he has been sleeping for the last 2 days and has not been eating or drinking.  This morning, he woke up and felt "swimmy headed" and more short of breath, prompting him to present to the emergency room.  He denies associated fevers, chest pain, nausea, vomiting.  He also states that he has had purulent discharge from his penis during the last week.  He is currently sexually active with multiple partners, does not use protection.  He is unsure if he has been exposed to any STDs, HIV, or syphilis.  The history is provided by the patient.       Home Medications Prior to Admission medications   Medication Sig Start Date End Date Taking? Authorizing Provider  doxycycline (VIBRAMYCIN) 100 MG capsule Take 1 capsule (100 mg total) by mouth 2 (two) times daily for 10 days. 10/26/21 11/05/21 Yes Kendi Defalco, [place], MD  carvedilol (COREG) 12.5 MG tablet Take 1 tablet (12.5 mg total) by mouth 2 (two) times daily with a meal. 06/29/21 06/24/22  Cantwell, Celeste C, PA-C  chlorthalidone (HYGROTON) 25 MG tablet Take 1 tablet (25 mg total) by mouth daily. Patient not taking: Reported on 06/29/2021 06/13/21 06/08/22  Elvin So C, PA-C  hydrALAZINE (APRESOLINE) 50 MG tablet Take 1 tablet (50 mg total) by mouth 3 (three) times daily. Patient not taking: Reported on 06/29/2021 06/13/21 06/08/22  Elvin So C, PA-C  isosorbide mononitrate (IMDUR) 30 MG 24 hr tablet Take 1  tablet (30 mg total) by mouth daily. Patient not taking: Reported on 06/29/2021 06/13/21 06/08/22  Elvin So C, PA-C  losartan (COZAAR) 25 MG tablet Take 1 tablet (25 mg total) by mouth daily. Patient not taking: Reported on 06/29/2021 06/13/21 06/08/22  Elvin So C, PA-C  potassium chloride (KLOR-CON M) 10 MEQ tablet Take 1 tablet (10 mEq total) by mouth daily. Patient not taking: Reported on 06/29/2021 06/13/21 06/08/22  Elvin So C, PA-C      Allergies    Eggs or egg-derived products, Banana, and Pork-derived products    Review of Systems   Review of Systems  Constitutional:  Negative for fever.  Respiratory:  Positive for shortness of breath.   Cardiovascular:  Positive for palpitations. Negative for chest pain.  Gastrointestinal:  Negative for abdominal pain, nausea and vomiting.  Genitourinary:  Positive for penile discharge. Negative for dysuria, hematuria and urgency.    Physical Exam Updated Vital Signs BP (!) 153/107 (BP Location: Right Arm)   Pulse 82   Temp 98.2 F (36.8 C) (Oral)   Resp 15   Ht 5\' 7"  (1.702 m)   Wt 79.4 kg   SpO2 98%   BMI 27.41 kg/m  Physical Exam Vitals and nursing note reviewed. Exam conducted with a chaperone present.  Constitutional:      General: He is not in acute distress.    Appearance: Normal appearance. He is well-developed.  HENT:     Head: Normocephalic.  Eyes:  Conjunctiva/sclera: Conjunctivae normal.  Cardiovascular:     Rate and Rhythm: Normal rate and regular rhythm.     Pulses: Normal pulses.     Heart sounds: No murmur heard. Pulmonary:     Effort: Pulmonary effort is normal. No respiratory distress.     Breath sounds: Normal breath sounds. No wheezing, rhonchi or rales.  Abdominal:     Palpations: Abdomen is soft.     Tenderness: There is no abdominal tenderness. There is no guarding.  Genitourinary:    Comments: RN Sheria Lang present at bedside during external GU exam.  There is frank purulence noted at  the urethral meatus.  He does have several light colored macular lesions noted diffusely to the penile shaft.  No ulcerations or erythema appreciated. No palpable lymphadenopathy. Left inguinal hernia present is soft, non-tender.  Skin:    General: Skin is warm and dry.  Neurological:     Mental Status: He is alert.     ED Results / Procedures / Treatments   Labs (all labs ordered are listed, but only abnormal results are displayed) Labs Reviewed  BASIC METABOLIC PANEL - Abnormal; Notable for the following components:      Result Value   Glucose, Bld 139 (*)    Creatinine, Ser 1.48 (*)    GFR, Estimated 57 (*)    All other components within normal limits  CBC - Abnormal; Notable for the following components:   WBC 3.0 (*)    MCV 103.0 (*)    MCH 35.1 (*)    RDW 16.6 (*)    Platelets 113 (*)    All other components within normal limits  URINALYSIS, ROUTINE W REFLEX MICROSCOPIC - Abnormal; Notable for the following components:   Color, Urine AMBER (*)    APPearance CLOUDY (*)    Glucose, UA 50 (*)    Protein, ur 30 (*)    Leukocytes,Ua LARGE (*)    WBC, UA >50 (*)    Bacteria, UA RARE (*)    All other components within normal limits  HEPATIC FUNCTION PANEL - Abnormal; Notable for the following components:   Total Protein 5.9 (*)    Albumin 2.7 (*)    All other components within normal limits  TROPONIN I (HIGH SENSITIVITY) - Abnormal; Notable for the following components:   Troponin I (High Sensitivity) 22 (*)    All other components within normal limits  TROPONIN I (HIGH SENSITIVITY) - Abnormal; Notable for the following components:   Troponin I (High Sensitivity) 26 (*)    All other components within normal limits  HIV ANTIBODY (ROUTINE TESTING W REFLEX)  RPR  GC/CHLAMYDIA PROBE AMP ([place]) NOT AT [facility]    EKG None  Radiology DG Chest 1 View  Result Date: 10/26/2021 CLINICAL DATA:  Weakness and dizziness.  Shortness of breath. EXAM: CHEST  1 VIEW COMPARISON:   AP chest 05/26/2021 chest two views 05/13/2021 and 04/07/2021 FINDINGS: cardiac silhouette and mediastinal contours within normal limits. Mild calcification within aortic arch. The bilateral lungs are clear. Resolution of the prior bibasilar heterogeneous airspace opacity seen on 05/26/2021. IMPRESSION: No active disease.  Resolution of the prior pneumonia. Electronically Signed   By: Neita Garnet M.D.   On: 10/26/2021 13:31    Procedures Procedures    Medications Ordered in ED Medications  lactated ringers bolus 500 mL (500 mLs Intravenous New Bag/Given 10/26/21 1638)  cefTRIAXone (ROCEPHIN) 1 g in sodium chloride 0.9 % 100 mL IVPB (0 g Intravenous Stopped 10/26/21 1638)  doxycycline (VIBRA-TABS) tablet 100 mg (100 mg Oral Given 10/26/21 1616)    ED Course/ Medical Decision Making/ A&P                           Medical Decision Making Amount and/or Complexity of Data Reviewed Labs: ordered.  Risk Prescription drug management.   51 year old male with history as above presents today for multiple complaints.  On arrival, patient is hemodynamically stable, afebrile.  He has evidence of purulent discharge at the urethral meatus, concerning for underlying STD.  He additionally has several macular lesions present to the shaft of his penis without underlying ulcerations.  Patient does admit to engaging in high risk sexual behaviors thus will plan to obtain RPR and HIV screening.  We will additionally treat empirically with IV Rocephin and doxycycline.    Regarding his shortness of breath, patient states that symptoms have been ongoing for several months.  Considered atypical chest pain, though EKG with sinus rhythm and evidence of LVH, appears to similar to prior. Troponins are stable. Lower suspicion for PE in the absence of pleuritic symptoms and tachycardia.  I reviewed patient's chest x-ray which is reassuring.  No evidence of interstitial airspace opacities that would support pulmonary edema  or volume overload.  No focal consolidations to support pneumonia.  I reviewed and interpreted the patient's labs largely reassuring.  CBC with mild leukopenia, though this is overall nonspecific.  Similar thrombocytopenia to prior.  CMP without significant electrolyte abnormalities.  He does have a slight bump in his creatinine today, suspect prerenal in the setting of his poor p.o. intake over the last several days.  He was treated with a small IV fluid bolus and was able to tolerate p.o. without difficulty.  UA appears dirty but not acutely infected.  On reevaluation, patient stated that he was feeling well and was ready for discharge.  Results were discussed with the patient, including plan for empiric STD treatment with course of doxycycline.  Counseled patient on safe sex practices and advised that he will need to abstain from sex for the next 2 weeks until his treatment is completed.  Patient verbalized understanding plan of care.  At this time, patient is stable for discharge home.  Final Clinical Impression(s) / ED Diagnoses Final diagnoses:  Penile discharge  STD (male)    Rx / DC Orders ED Discharge Orders          Ordered    doxycycline (VIBRAMYCIN) 100 MG capsule  2 times daily        10/26/21 1848              Kecia Swoboda, [place], MD 10/26/21 1913    Tegeler, Canary Brim, MD 10/27/21 [phone]

## 2021-10-27 LAB — RPR: RPR Ser Ql: NONREACTIVE

## 2021-10-28 LAB — GC/CHLAMYDIA PROBE AMP (~~LOC~~) NOT AT ARMC
Chlamydia: NEGATIVE
Comment: NEGATIVE
Comment: NORMAL
Neisseria Gonorrhea: POSITIVE — AB

## 2021-11-01 ENCOUNTER — Emergency Department (HOSPITAL_COMMUNITY): Payer: Medicare Other

## 2021-11-01 ENCOUNTER — Emergency Department (HOSPITAL_COMMUNITY)
Admission: EM | Admit: 2021-11-01 | Discharge: 2021-11-01 | Disposition: A | Discharge status: Home / Self Care | Payer: Medicare Other | Attending: Emergency Medicine | Admitting: Emergency Medicine

## 2021-11-01 ENCOUNTER — Other Ambulatory Visit: Payer: Self-pay

## 2021-11-01 ENCOUNTER — Encounter (HOSPITAL_COMMUNITY): Payer: Self-pay

## 2021-11-01 DIAGNOSIS — W19XXXA Unspecified fall, initial encounter: Secondary | ICD-10-CM | POA: Insufficient documentation

## 2021-11-01 DIAGNOSIS — I10 Essential (primary) hypertension: Secondary | ICD-10-CM | POA: Diagnosis not present

## 2021-11-01 DIAGNOSIS — S0081XA Abrasion of other part of head, initial encounter: Secondary | ICD-10-CM | POA: Diagnosis not present

## 2021-11-01 DIAGNOSIS — R531 Weakness: Secondary | ICD-10-CM | POA: Diagnosis not present

## 2021-11-01 DIAGNOSIS — M47812 Spondylosis without myelopathy or radiculopathy, cervical region: Secondary | ICD-10-CM | POA: Diagnosis not present

## 2021-11-01 DIAGNOSIS — S199XXA Unspecified injury of neck, initial encounter: Secondary | ICD-10-CM | POA: Diagnosis not present

## 2021-11-01 DIAGNOSIS — S0990XA Unspecified injury of head, initial encounter: Secondary | ICD-10-CM | POA: Diagnosis not present

## 2021-11-01 DIAGNOSIS — Z79899 Other long term (current) drug therapy: Secondary | ICD-10-CM | POA: Insufficient documentation

## 2021-11-01 DIAGNOSIS — Y92830 Public park as the place of occurrence of the external cause: Secondary | ICD-10-CM | POA: Diagnosis not present

## 2021-11-01 DIAGNOSIS — Y901 Blood alcohol level of 20-39 mg/100 ml: Secondary | ICD-10-CM | POA: Diagnosis not present

## 2021-11-01 DIAGNOSIS — F1910 Other psychoactive substance abuse, uncomplicated: Secondary | ICD-10-CM | POA: Diagnosis not present

## 2021-11-01 LAB — COMPREHENSIVE METABOLIC PANEL
ALT: 25 U/L (ref 0–44)
AST: 39 U/L (ref 15–41)
Albumin: 3.3 g/dL — ABNORMAL LOW (ref 3.5–5.0)
Alkaline Phosphatase: 55 U/L (ref 38–126)
Anion gap: 11 (ref 5–15)
BUN: 25 mg/dL — ABNORMAL HIGH (ref 6–20)
CO2: 18 mmol/L — ABNORMAL LOW (ref 22–32)
Calcium: 8.5 mg/dL — ABNORMAL LOW (ref 8.9–10.3)
Chloride: 110 mmol/L (ref 98–111)
Creatinine, Ser: 1.69 mg/dL — ABNORMAL HIGH (ref 0.61–1.24)
GFR, Estimated: 49 mL/min — ABNORMAL LOW (ref >60)
Glucose, Bld: 92 mg/dL (ref 70–99)
Potassium: 3.6 mmol/L (ref 3.5–5.1)
Sodium: 139 mmol/L (ref 135–145)
Total Bilirubin: 0.8 mg/dL (ref 0.3–1.2)
Total Protein: 6.7 g/dL (ref 6.5–8.1)

## 2021-11-01 LAB — CBC WITH DIFFERENTIAL/PLATELET
Abs Immature Granulocytes: 0.02 10*3/uL (ref 0.00–0.07)
Basophils Absolute: 0 10*3/uL (ref 0.0–0.1)
Basophils Relative: 0 %
Eosinophils Absolute: 0 10*3/uL (ref 0.0–0.5)
Eosinophils Relative: 0 %
HCT: 37.1 % — ABNORMAL LOW (ref 39.0–52.0)
Hemoglobin: 11.8 g/dL — ABNORMAL LOW (ref 13.0–17.0)
Immature Granulocytes: 1 %
Lymphocytes Relative: 21 %
Lymphs Abs: 0.5 10*3/uL — ABNORMAL LOW (ref 0.7–4.0)
MCH: 34.9 pg — ABNORMAL HIGH (ref 26.0–34.0)
MCHC: 31.8 g/dL (ref 30.0–36.0)
MCV: 109.8 fL — ABNORMAL HIGH (ref 80.0–100.0)
Monocytes Absolute: 0.3 10*3/uL (ref 0.1–1.0)
Monocytes Relative: 10 %
Neutro Abs: 1.7 10*3/uL (ref 1.7–7.7)
Neutrophils Relative %: 68 %
Platelets: 102 10*3/uL — ABNORMAL LOW (ref 150–400)
RBC: 3.38 MIL/uL — ABNORMAL LOW (ref 4.22–5.81)
RDW: 17.5 % — ABNORMAL HIGH (ref 11.5–15.5)
WBC: 2.5 10*3/uL — ABNORMAL LOW (ref 4.0–10.5)
nRBC: 0 % (ref 0.0–0.2)

## 2021-11-01 LAB — RAPID URINE DRUG SCREEN, HOSP PERFORMED
Amphetamines: NOT DETECTED
Barbiturates: NOT DETECTED
Benzodiazepines: NOT DETECTED
Cocaine: POSITIVE — AB
Opiates: NOT DETECTED
Tetrahydrocannabinol: NOT DETECTED

## 2021-11-01 LAB — URINALYSIS, ROUTINE W REFLEX MICROSCOPIC
Bilirubin Urine: NEGATIVE
Glucose, UA: 50 mg/dL — AB
Ketones, ur: NEGATIVE mg/dL
Leukocytes,Ua: NEGATIVE
Nitrite: NEGATIVE
Protein, ur: >=300 mg/dL — AB
Specific Gravity, Urine: 1.023 (ref 1.005–1.030)
pH: 5 (ref 5.0–8.0)

## 2021-11-01 LAB — AMMONIA: Ammonia: 35 umol/L (ref 9–35)

## 2021-11-01 LAB — ETHANOL: Alcohol, Ethyl (B): 33 mg/dL — ABNORMAL HIGH (ref <10)

## 2021-11-01 MED ORDER — ONDANSETRON HCL 4 MG/2ML IJ SOLN
INTRAMUSCULAR | Status: AC
  Administered 2021-11-01: 4 mg
  Filled 2021-11-01: qty 2

## 2021-11-01 MED ORDER — SODIUM CHLORIDE 0.9 % IV BOLUS
1000.0000 mL | Freq: Once | INTRAVENOUS | Status: AC
  Administered 2021-11-01: 1000 mL via INTRAVENOUS

## 2021-11-01 NOTE — Discharge Instructions (Signed)
Follow-up with any problems

## 2021-11-01 NOTE — ED Triage Notes (Signed)
[place], pt's mouth is bleeding. ETOH, combative. Pt arrived with upper extremity restraints and cursing.

## 2021-11-01 NOTE — ED Provider Notes (Incomplete)
Charles [name]   CSN: [id number] Arrival date & time: 11/01/21  1931     History {Add pertinent medical, surgical, social history, OB history to HPI:1} Chief Complaint  Patient presents with   Alcohol Intoxication    Charles [name] is a 51 y.o. male.  Patient has a history of substance abuse and hepatitis C.  [place] and had [place].   Alcohol Intoxication       Home Medications Prior to Admission medications   Medication Sig Start Date End Date Taking? Authorizing Provider  carvedilol (COREG) 12.5 MG tablet Take 1 tablet (12.5 mg total) by mouth 2 (two) times daily with a meal. 06/29/21 06/24/22  Cantwell, Celeste C, PA-C  chlorthalidone (HYGROTON) 25 MG tablet Take 1 tablet (25 mg total) by mouth daily. Patient not taking: Reported on 06/29/2021 06/13/21 06/08/22  Lawerance Cruel C, PA-C  doxycycline (VIBRAMYCIN) 100 MG capsule Take 1 capsule (100 mg total) by mouth 2 (two) times daily for 10 days. 10/26/21 11/05/21  Causey, [place], MD  hydrALAZINE (APRESOLINE) 50 MG tablet Take 1 tablet (50 mg total) by mouth 3 (three) times daily. Patient not taking: Reported on 06/29/2021 06/13/21 06/08/22  Lawerance Cruel C, PA-C  isosorbide mononitrate (IMDUR) 30 MG 24 hr tablet Take 1 tablet (30 mg total) by mouth daily. Patient not taking: Reported on 06/29/2021 06/13/21 06/08/22  Lawerance Cruel C, PA-C  losartan (COZAAR) 25 MG tablet Take 1 tablet (25 mg total) by mouth daily. Patient not taking: Reported on 06/29/2021 06/13/21 06/08/22  Lawerance Cruel C, PA-C  potassium chloride (KLOR-CON M) 10 MEQ tablet Take 1 tablet (10 mEq total) by mouth daily. Patient not taking: Reported on 06/29/2021 06/13/21 06/08/22  Lawerance Cruel C, PA-C      Allergies    Eggs or egg-derived products, Banana, and Pork-derived products    Review of Systems   Review of Systems  Physical Exam Updated Vital Signs BP 119/76   Pulse 84    Temp 97.7 F (36.5 C) (Oral)   Resp 13   SpO2 95%  Physical Exam  ED Results / Procedures / Treatments   Labs (all labs ordered are listed, but only abnormal results are displayed) Labs Reviewed  CBC WITH DIFFERENTIAL/PLATELET - Abnormal; Notable for the following components:      Result Value   WBC 2.5 (*)    RBC 3.38 (*)    Hemoglobin 11.8 (*)    HCT 37.1 (*)    MCV 109.8 (*)    MCH 34.9 (*)    RDW 17.5 (*)    Platelets 102 (*)    Lymphs Abs 0.5 (*)    All other components within normal limits  COMPREHENSIVE METABOLIC PANEL - Abnormal; Notable for the following components:   CO2 18 (*)    BUN 25 (*)    Creatinine, Ser 1.69 (*)    Calcium 8.5 (*)    Albumin 3.3 (*)    GFR, Estimated 49 (*)    All other components within normal limits  ETHANOL - Abnormal; Notable for the following components:   Alcohol, Ethyl (B) 33 (*)    All other components within normal limits  RAPID URINE DRUG SCREEN, HOSP PERFORMED - Abnormal; Notable for the following components:   Cocaine POSITIVE (*)    All other components within normal limits  URINALYSIS, ROUTINE W REFLEX MICROSCOPIC - Abnormal; Notable for the following components:   APPearance HAZY (*)  Glucose, UA 50 (*)    Hgb urine dipstick MODERATE (*)    Protein, ur >=300 (*)    Bacteria, UA RARE (*)    All other components within normal limits  AMMONIA    EKG None  Radiology CT Head Wo Contrast  Result Date: 11/01/2021 CLINICAL DATA:  Recent trauma with mouth bleeding and toxic a shin, initial encounter EXAM: CT HEAD WITHOUT CONTRAST CT CERVICAL SPINE WITHOUT CONTRAST TECHNIQUE: Multidetector CT imaging of the head and cervical spine was performed following the standard protocol without intravenous contrast. Multiplanar CT image reconstructions of the cervical spine were also generated. RADIATION DOSE REDUCTION: This exam was performed according to the departmental dose-optimization program which includes automated exposure  control, adjustment of the mA and/or kV according to patient size and/or use of iterative reconstruction technique. COMPARISON:  06/06/2020 FINDINGS: CT HEAD FINDINGS Brain: No evidence of acute infarction, hemorrhage, hydrocephalus, extra-axial collection or mass lesion/mass effect. Vascular: No hyperdense vessel or unexpected calcification. Skull: Normal. Negative for fracture or focal lesion. Sinuses/Orbits: No acute finding. Other: None. CT CERVICAL SPINE FINDINGS Alignment: Normal. Skull base and vertebrae: 7 cervical segments are well visualized. Vertebral body height is well maintained. Degenerative changes of the C2-3 facet joint on the right are again identified and somewhat progressed from the prior exam. No acute fracture or acute facet abnormality is noted. Soft tissues and spinal canal: Surrounding soft tissue structures are within normal limits. Upper chest: Visualized lung apices are unremarkable. Other: None IMPRESSION: CT of the head: No acute intracranial abnormality noted. CT of the cervical spine: No acute abnormality noted. Progressive degenerative changes at the C2-3 articular facet on the right are seen. Electronically Signed   By: Alcide Clever M.D.   On: 11/01/2021 21:01   CT Cervical Spine Wo Contrast  Result Date: 11/01/2021 CLINICAL DATA:  Recent trauma with mouth bleeding and toxic a shin, initial encounter EXAM: CT HEAD WITHOUT CONTRAST CT CERVICAL SPINE WITHOUT CONTRAST TECHNIQUE: Multidetector CT imaging of the head and cervical spine was performed following the standard protocol without intravenous contrast. Multiplanar CT image reconstructions of the cervical spine were also generated. RADIATION DOSE REDUCTION: This exam was performed according to the departmental dose-optimization program which includes automated exposure control, adjustment of the mA and/or kV according to patient size and/or use of iterative reconstruction technique. COMPARISON:  06/06/2020 FINDINGS: CT HEAD  FINDINGS Brain: No evidence of acute infarction, hemorrhage, hydrocephalus, extra-axial collection or mass lesion/mass effect. Vascular: No hyperdense vessel or unexpected calcification. Skull: Normal. Negative for fracture or focal lesion. Sinuses/Orbits: No acute finding. Other: None. CT CERVICAL SPINE FINDINGS Alignment: Normal. Skull base and vertebrae: 7 cervical segments are well visualized. Vertebral body height is well maintained. Degenerative changes of the C2-3 facet joint on the right are again identified and somewhat progressed from the prior exam. No acute fracture or acute facet abnormality is noted. Soft tissues and spinal canal: Surrounding soft tissue structures are within normal limits. Upper chest: Visualized lung apices are unremarkable. Other: None IMPRESSION: CT of the head: No acute intracranial abnormality noted. CT of the cervical spine: No acute abnormality noted. Progressive degenerative changes at the C2-3 articular facet on the right are seen. Electronically Signed   By: Alcide Clever M.D.   On: 11/01/2021 21:01   DG Chest Port 1 View  Result Date: 11/01/2021 CLINICAL DATA:  Weakness EXAM: PORTABLE CHEST 1 VIEW COMPARISON:  10/26/2021 FINDINGS: Cardiac shadow is stable but accentuated by the portable technique. The lungs are  well aerated bilaterally. Mild central vascular prominence is seen without edema. No focal infiltrate is noted. No bony abnormality is seen. IMPRESSION: Mild central vascular prominence without edema or focal infiltrate. Electronically Signed   By: Alcide Clever M.D.   On: 11/01/2021 20:25    Procedures Procedures  {Document cardiac monitor, telemetry assessment procedure when appropriate:1}  Medications Ordered in ED Medications  sodium chloride 0.9 % bolus 1,000 mL (0 mLs Intravenous Stopped 11/01/21 2139)  ondansetron (ZOFRAN) 4 MG/2ML injection (4 mg  Given 11/01/21 2016)    ED Course/ Medical Decision Making/ A&P                           Medical  Decision Making Amount and/or Complexity of Data Reviewed Labs: ordered. Radiology: ordered. ECG/medicine tests: ordered.   Patient with a fall minor abrasion to face.  Cocaine abuse.  Patient ambulating without problems will be sent home  {Document critical care time when appropriate:1} {Document review of labs and clinical decision tools ie heart score, Chads2Vasc2 etc:1}  {Document your independent review of radiology images, and any outside records:1} {Document your discussion with family members, caretakers, and with consultants:1} {Document social determinants of health affecting pt's care:1} {Document your decision making why or why not admission, treatments were needed:1} Final Clinical Impression(s) / ED Diagnoses Final diagnoses:  Fall, initial encounter  Substance abuse (HCC)    Rx / DC Orders ED Discharge Orders     None

## 2021-11-01 NOTE — ED Notes (Signed)
Pt ambulated to restroom and back to room with steady gait, was given clothes from clothes closet and a bus pass

## 2021-11-26 DIAGNOSIS — Z20822 Contact with and (suspected) exposure to covid-19: Secondary | ICD-10-CM | POA: Diagnosis not present

## 2022-02-24 ENCOUNTER — Emergency Department (HOSPITAL_COMMUNITY)
Admission: EM | Admit: 2022-02-24 | Discharge: 2022-02-24 | Discharge status: Against Medical Advice | Payer: Medicare Other

## 2022-02-24 NOTE — ED Notes (Signed)
PT called X2 for triage no response. Will attempt again 

## 2022-02-24 NOTE — ED Notes (Signed)
PT called again for triage no response 

## 2022-02-27 ENCOUNTER — Other Ambulatory Visit: Payer: Self-pay

## 2022-02-27 ENCOUNTER — Encounter (HOSPITAL_COMMUNITY): Payer: Self-pay

## 2022-02-27 ENCOUNTER — Inpatient Hospital Stay (HOSPITAL_COMMUNITY)
Admission: EM | Admit: 2022-02-27 | Discharge: 2022-03-04 | DRG: 291 | Disposition: A | Discharge status: Home / Self Care | Payer: Medicare Other | Attending: Internal Medicine | Admitting: Internal Medicine

## 2022-02-27 ENCOUNTER — Emergency Department (HOSPITAL_COMMUNITY): Payer: Medicare Other

## 2022-02-27 DIAGNOSIS — Z91018 Allergy to other foods: Secondary | ICD-10-CM | POA: Diagnosis not present

## 2022-02-27 DIAGNOSIS — Z91014 Allergy to mammalian meats: Secondary | ICD-10-CM

## 2022-02-27 DIAGNOSIS — Z1152 Encounter for screening for COVID-19: Secondary | ICD-10-CM

## 2022-02-27 DIAGNOSIS — R7989 Other specified abnormal findings of blood chemistry: Secondary | ICD-10-CM | POA: Diagnosis not present

## 2022-02-27 DIAGNOSIS — D619 Aplastic anemia, unspecified: Secondary | ICD-10-CM | POA: Diagnosis present

## 2022-02-27 DIAGNOSIS — I509 Heart failure, unspecified: Secondary | ICD-10-CM | POA: Diagnosis not present

## 2022-02-27 DIAGNOSIS — Z801 Family history of malignant neoplasm of trachea, bronchus and lung: Secondary | ICD-10-CM | POA: Diagnosis not present

## 2022-02-27 DIAGNOSIS — E871 Hypo-osmolality and hyponatremia: Secondary | ICD-10-CM | POA: Diagnosis not present

## 2022-02-27 DIAGNOSIS — Z79899 Other long term (current) drug therapy: Secondary | ICD-10-CM

## 2022-02-27 DIAGNOSIS — I5021 Acute systolic (congestive) heart failure: Secondary | ICD-10-CM | POA: Diagnosis not present

## 2022-02-27 DIAGNOSIS — I5023 Acute on chronic systolic (congestive) heart failure: Secondary | ICD-10-CM | POA: Diagnosis present

## 2022-02-27 DIAGNOSIS — E876 Hypokalemia: Secondary | ICD-10-CM | POA: Diagnosis not present

## 2022-02-27 DIAGNOSIS — Z91012 Allergy to eggs: Secondary | ICD-10-CM

## 2022-02-27 DIAGNOSIS — I110 Hypertensive heart disease with heart failure: Secondary | ICD-10-CM | POA: Diagnosis not present

## 2022-02-27 DIAGNOSIS — D696 Thrombocytopenia, unspecified: Secondary | ICD-10-CM | POA: Diagnosis present

## 2022-02-27 DIAGNOSIS — Z800 Family history of malignant neoplasm of digestive organs: Secondary | ICD-10-CM

## 2022-02-27 DIAGNOSIS — I5082 Biventricular heart failure: Secondary | ICD-10-CM | POA: Diagnosis not present

## 2022-02-27 DIAGNOSIS — F17210 Nicotine dependence, cigarettes, uncomplicated: Secondary | ICD-10-CM | POA: Diagnosis present

## 2022-02-27 DIAGNOSIS — F17200 Nicotine dependence, unspecified, uncomplicated: Secondary | ICD-10-CM | POA: Diagnosis not present

## 2022-02-27 DIAGNOSIS — I50813 Acute on chronic right heart failure: Principal | ICD-10-CM

## 2022-02-27 DIAGNOSIS — I160 Hypertensive urgency: Secondary | ICD-10-CM | POA: Diagnosis present

## 2022-02-27 DIAGNOSIS — I428 Other cardiomyopathies: Secondary | ICD-10-CM | POA: Diagnosis not present

## 2022-02-27 DIAGNOSIS — F1410 Cocaine abuse, uncomplicated: Secondary | ICD-10-CM | POA: Diagnosis present

## 2022-02-27 DIAGNOSIS — I5043 Acute on chronic combined systolic (congestive) and diastolic (congestive) heart failure: Secondary | ICD-10-CM | POA: Diagnosis present

## 2022-02-27 DIAGNOSIS — N182 Chronic kidney disease, stage 2 (mild): Secondary | ICD-10-CM | POA: Diagnosis not present

## 2022-02-27 DIAGNOSIS — Z91148 Patient's other noncompliance with medication regimen for other reason: Secondary | ICD-10-CM | POA: Diagnosis not present

## 2022-02-27 DIAGNOSIS — B1920 Unspecified viral hepatitis C without hepatic coma: Secondary | ICD-10-CM | POA: Diagnosis present

## 2022-02-27 DIAGNOSIS — I130 Hypertensive heart and chronic kidney disease with heart failure and stage 1 through stage 4 chronic kidney disease, or unspecified chronic kidney disease: Secondary | ICD-10-CM | POA: Diagnosis not present

## 2022-02-27 DIAGNOSIS — Z720 Tobacco use: Secondary | ICD-10-CM | POA: Diagnosis present

## 2022-02-27 DIAGNOSIS — J90 Pleural effusion, not elsewhere classified: Secondary | ICD-10-CM | POA: Diagnosis not present

## 2022-02-27 DIAGNOSIS — R0602 Shortness of breath: Secondary | ICD-10-CM | POA: Diagnosis not present

## 2022-02-27 LAB — BASIC METABOLIC PANEL
Anion gap: 6 (ref 5–15)
BUN: 26 mg/dL — ABNORMAL HIGH (ref 6–20)
CO2: 23 mmol/L (ref 22–32)
Calcium: 8.8 mg/dL — ABNORMAL LOW (ref 8.9–10.3)
Chloride: 110 mmol/L (ref 98–111)
Creatinine, Ser: 1.3 mg/dL — ABNORMAL HIGH (ref 0.61–1.24)
GFR, Estimated: >60 mL/min (ref >60)
Glucose, Bld: 85 mg/dL (ref 70–99)
Potassium: 4.1 mmol/L (ref 3.5–5.1)
Sodium: 139 mmol/L (ref 135–145)

## 2022-02-27 LAB — CBC
HCT: 40.3 % (ref 39.0–52.0)
Hemoglobin: 13.9 g/dL (ref 13.0–17.0)
MCH: 35.5 pg — ABNORMAL HIGH (ref 26.0–34.0)
MCHC: 34.5 g/dL (ref 30.0–36.0)
MCV: 103.1 fL — ABNORMAL HIGH (ref 80.0–100.0)
Platelets: 113 10*3/uL — ABNORMAL LOW (ref 150–400)
RBC: 3.91 MIL/uL — ABNORMAL LOW (ref 4.22–5.81)
RDW: 16.7 % — ABNORMAL HIGH (ref 11.5–15.5)
WBC: 4 10*3/uL (ref 4.0–10.5)
nRBC: 0 % (ref 0.0–0.2)

## 2022-02-27 LAB — BRAIN NATRIURETIC PEPTIDE: B Natriuretic Peptide: 1563.5 pg/mL — ABNORMAL HIGH (ref 0.0–100.0)

## 2022-02-27 LAB — TROPONIN I (HIGH SENSITIVITY): Troponin I (High Sensitivity): 73 ng/L — ABNORMAL HIGH (ref <18)

## 2022-02-27 NOTE — ED Triage Notes (Addendum)
Complains of chest pain and sob and difficutly sleeping. Also reports nasal congestion. Patient used cocaine and crack. Patient hyperventilating in triage. Patient is homeless reports he doesn't take his meds.   Patient stops breathing fast if you stop watching him

## 2022-02-27 NOTE — ED Provider Triage Note (Signed)
Emergency Medicine Provider Triage Evaluation Note  Charles [name] , a 51 y.o. male  was evaluated in triage.  Pt complains of shortness of breath and chest pain with some difficulty sleeping.  Also reports some nasal congestion over last few days.  Patient is homeless and admits to recent cocaine and crack use.  Notably hx for hepatitis C, polysubstance abuse, CHF, hypertensive urgency, multiple episodes of pneumonia, and aplastic anemia.  Denies chest pain, or recent fevers or chills.  Review of Systems  Positive:  Negative: See above  Physical Exam  BP (!) 178/129 (BP Location: Right Arm)   Pulse 90   Temp 98.5 F (36.9 C) (Oral)   Resp (!) 24   Ht 5\' 7"  (1.702 m)   Wt 79.4 kg   SpO2 97%   BMI 27.41 kg/m  Gen:   Awake, no distress, sitting comfortably Resp:  Normal effort, communicates without difficulty, equal chest rise MSK:   Moves extremities without difficulty  Other:  Gait appears grossly intact.  Does not appear to be hyperventilating or tachypneic.  Without significant LE swelling.  Medical Decision Making  Medically screening exam initiated at 6:53 PM.  Appropriate orders placed.  [name] was informed that the remainder of the evaluation will be completed by another provider, this initial triage assessment does not replace that evaluation, and the importance of remaining in the ED until their evaluation is complete.     [name], PA-C 79/89/21 1859

## 2022-02-28 DIAGNOSIS — Z91148 Patient's other noncompliance with medication regimen for other reason: Secondary | ICD-10-CM | POA: Diagnosis not present

## 2022-02-28 DIAGNOSIS — Z801 Family history of malignant neoplasm of trachea, bronchus and lung: Secondary | ICD-10-CM | POA: Diagnosis not present

## 2022-02-28 DIAGNOSIS — I428 Other cardiomyopathies: Secondary | ICD-10-CM | POA: Diagnosis present

## 2022-02-28 DIAGNOSIS — I160 Hypertensive urgency: Secondary | ICD-10-CM | POA: Diagnosis not present

## 2022-02-28 DIAGNOSIS — Z800 Family history of malignant neoplasm of digestive organs: Secondary | ICD-10-CM | POA: Diagnosis not present

## 2022-02-28 DIAGNOSIS — F1410 Cocaine abuse, uncomplicated: Secondary | ICD-10-CM | POA: Diagnosis present

## 2022-02-28 DIAGNOSIS — E871 Hypo-osmolality and hyponatremia: Secondary | ICD-10-CM | POA: Diagnosis not present

## 2022-02-28 DIAGNOSIS — I5023 Acute on chronic systolic (congestive) heart failure: Secondary | ICD-10-CM | POA: Diagnosis present

## 2022-02-28 DIAGNOSIS — Z91012 Allergy to eggs: Secondary | ICD-10-CM | POA: Diagnosis not present

## 2022-02-28 DIAGNOSIS — Z91014 Allergy to mammalian meats: Secondary | ICD-10-CM | POA: Diagnosis not present

## 2022-02-28 DIAGNOSIS — N182 Chronic kidney disease, stage 2 (mild): Secondary | ICD-10-CM | POA: Diagnosis present

## 2022-02-28 DIAGNOSIS — B1920 Unspecified viral hepatitis C without hepatic coma: Secondary | ICD-10-CM | POA: Diagnosis present

## 2022-02-28 DIAGNOSIS — F17200 Nicotine dependence, unspecified, uncomplicated: Secondary | ICD-10-CM | POA: Diagnosis not present

## 2022-02-28 DIAGNOSIS — D696 Thrombocytopenia, unspecified: Secondary | ICD-10-CM

## 2022-02-28 DIAGNOSIS — I130 Hypertensive heart and chronic kidney disease with heart failure and stage 1 through stage 4 chronic kidney disease, or unspecified chronic kidney disease: Secondary | ICD-10-CM | POA: Diagnosis present

## 2022-02-28 DIAGNOSIS — R7989 Other specified abnormal findings of blood chemistry: Secondary | ICD-10-CM | POA: Insufficient documentation

## 2022-02-28 DIAGNOSIS — I5043 Acute on chronic combined systolic (congestive) and diastolic (congestive) heart failure: Secondary | ICD-10-CM

## 2022-02-28 DIAGNOSIS — Z1152 Encounter for screening for COVID-19: Secondary | ICD-10-CM | POA: Diagnosis not present

## 2022-02-28 DIAGNOSIS — I5021 Acute systolic (congestive) heart failure: Secondary | ICD-10-CM | POA: Diagnosis not present

## 2022-02-28 DIAGNOSIS — D619 Aplastic anemia, unspecified: Secondary | ICD-10-CM | POA: Diagnosis present

## 2022-02-28 DIAGNOSIS — I5082 Biventricular heart failure: Secondary | ICD-10-CM | POA: Diagnosis present

## 2022-02-28 DIAGNOSIS — E876 Hypokalemia: Secondary | ICD-10-CM | POA: Diagnosis not present

## 2022-02-28 DIAGNOSIS — Z91018 Allergy to other foods: Secondary | ICD-10-CM | POA: Diagnosis not present

## 2022-02-28 DIAGNOSIS — Z79899 Other long term (current) drug therapy: Secondary | ICD-10-CM | POA: Diagnosis not present

## 2022-02-28 DIAGNOSIS — F17210 Nicotine dependence, cigarettes, uncomplicated: Secondary | ICD-10-CM | POA: Diagnosis present

## 2022-02-28 LAB — URINALYSIS, ROUTINE W REFLEX MICROSCOPIC
Bilirubin Urine: NEGATIVE
Glucose, UA: NEGATIVE mg/dL
Hgb urine dipstick: NEGATIVE
Ketones, ur: NEGATIVE mg/dL
Leukocytes,Ua: NEGATIVE
Nitrite: NEGATIVE
Protein, ur: NEGATIVE mg/dL
Specific Gravity, Urine: 1.008 (ref 1.005–1.030)
pH: 5 (ref 5.0–8.0)

## 2022-02-28 LAB — TROPONIN I (HIGH SENSITIVITY): Troponin I (High Sensitivity): 65 ng/L — ABNORMAL HIGH (ref <18)

## 2022-02-28 LAB — RAPID URINE DRUG SCREEN, HOSP PERFORMED
Amphetamines: NOT DETECTED
Barbiturates: NOT DETECTED
Benzodiazepines: NOT DETECTED
Cocaine: NOT DETECTED
Opiates: NOT DETECTED
Tetrahydrocannabinol: NOT DETECTED

## 2022-02-28 MED ORDER — HYDRALAZINE HCL 50 MG PO TABS
50.0000 mg | ORAL_TABLET | Freq: Three times a day (TID) | ORAL | Status: DC
  Administered 2022-02-28 – 2022-03-01 (×3): 50 mg via ORAL
  Filled 2022-02-28 (×2): qty 2
  Filled 2022-02-28: qty 1

## 2022-02-28 MED ORDER — ACETAMINOPHEN 650 MG RE SUPP: 650.0000 mg | Freq: Four times a day (QID) | RECTAL | Status: DC | PRN

## 2022-02-28 MED ORDER — FUROSEMIDE 10 MG/ML IJ SOLN
60.0000 mg | Freq: Once | INTRAMUSCULAR | Status: AC
  Administered 2022-02-28: 60 mg via INTRAVENOUS
  Filled 2022-02-28: qty 6

## 2022-02-28 MED ORDER — ACETAMINOPHEN 325 MG PO TABS: 650.0000 mg | ORAL_TABLET | Freq: Four times a day (QID) | ORAL | Status: DC | PRN

## 2022-02-28 MED ORDER — SODIUM CHLORIDE 0.9% FLUSH
3.0000 mL | Freq: Two times a day (BID) | INTRAVENOUS | Status: DC
  Administered 2022-02-28 – 2022-03-04 (×8): 3 mL via INTRAVENOUS

## 2022-02-28 MED ORDER — LOSARTAN POTASSIUM 25 MG PO TABS
25.0000 mg | ORAL_TABLET | Freq: Every day | ORAL | Status: DC
  Administered 2022-02-28 – 2022-03-04 (×5): 25 mg via ORAL
  Filled 2022-02-28: qty 0.5
  Filled 2022-02-28 (×5): qty 1

## 2022-02-28 MED ORDER — HYDRALAZINE HCL 20 MG/ML IJ SOLN: 10.0000 mg | INTRAMUSCULAR | Status: DC | PRN

## 2022-02-28 MED ORDER — NICOTINE 21 MG/24HR TD PT24
21.0000 mg | MEDICATED_PATCH | Freq: Every day | TRANSDERMAL | Status: DC
  Administered 2022-02-28 – 2022-03-04 (×5): 21 mg via TRANSDERMAL
  Filled 2022-02-28 (×6): qty 1

## 2022-02-28 MED ORDER — FUROSEMIDE 10 MG/ML IJ SOLN: 40.0000 mg | Freq: Two times a day (BID) | INTRAMUSCULAR | Status: DC

## 2022-02-28 MED ORDER — FUROSEMIDE 10 MG/ML IJ SOLN
40.0000 mg | Freq: Two times a day (BID) | INTRAMUSCULAR | Status: DC
  Administered 2022-02-28 – 2022-03-01 (×2): 40 mg via INTRAVENOUS
  Filled 2022-02-28 (×2): qty 4

## 2022-02-28 MED ORDER — ASPIRIN 81 MG PO CHEW
81.0000 mg | CHEWABLE_TABLET | Freq: Every day | ORAL | Status: DC
  Administered 2022-02-28 – 2022-03-04 (×5): 81 mg via ORAL
  Filled 2022-02-28 (×6): qty 1

## 2022-02-28 MED ORDER — ALBUTEROL SULFATE (2.5 MG/3ML) 0.083% IN NEBU
2.5000 mg | INHALATION_SOLUTION | Freq: Four times a day (QID) | RESPIRATORY_TRACT | Status: DC | PRN
  Filled 2022-02-28: qty 3

## 2022-02-28 NOTE — Progress Notes (Signed)
Heart Failure Navigator Progress Note  Assessed for Heart & Vascular TOC clinic readiness.  Patient does not meet criteria due to . [facility] cardiology   Navigator available for reassessment of patient.   Earnestine Leys, BSN, Clinical cytogeneticist Only

## 2022-02-28 NOTE — ED Notes (Signed)
Pt sleeping on the floor on blankets at this time.

## 2022-02-28 NOTE — ED Provider Notes (Signed)
Charles [name]   CSN: [id number] Arrival date & time: 02/27/22  1809     History  Chief Complaint  Patient presents with   Chest Pain   Shortness of Breath    Charles [name] is a 51 y.o. male.  Patient here with shortness of breath, chest pain.  Does admit to continued cocaine use.  History of heart failure, hypertension and.  Has not been compliant with his medications.  Shortness of breath worse with exertion.  Nothing makes it better.  Denies any cough but has had some congestion.  May be some leg swelling.  Denies any weakness, headache, vision loss.  The history is provided by the patient.       Home Medications Prior to Admission medications   Medication Sig Start Date End Date Taking? Authorizing Provider  carvedilol (COREG) 12.5 MG tablet Take 1 tablet (12.5 mg total) by mouth 2 (two) times daily with a meal. 06/29/21 06/24/22  Cantwell, Celeste C, PA-C  chlorthalidone (HYGROTON) 25 MG tablet Take 1 tablet (25 mg total) by mouth daily. Patient not taking: Reported on 06/29/2021 06/13/21 06/08/22  Elvin So C, PA-C  hydrALAZINE (APRESOLINE) 50 MG tablet Take 1 tablet (50 mg total) by mouth 3 (three) times daily. Patient not taking: Reported on 06/29/2021 06/13/21 06/08/22  Elvin So C, PA-C  isosorbide mononitrate (IMDUR) 30 MG 24 hr tablet Take 1 tablet (30 mg total) by mouth daily. Patient not taking: Reported on 06/29/2021 06/13/21 06/08/22  Elvin So C, PA-C  losartan (COZAAR) 25 MG tablet Take 1 tablet (25 mg total) by mouth daily. Patient not taking: Reported on 06/29/2021 06/13/21 06/08/22  Elvin So C, PA-C  potassium chloride (KLOR-CON M) 10 MEQ tablet Take 1 tablet (10 mEq total) by mouth daily. Patient not taking: Reported on 06/29/2021 06/13/21 06/08/22  Elvin So C, PA-C      Allergies    Eggs or egg-derived products, Banana, and Pork-derived products    Review of Systems   Review of  Systems  Physical Exam Updated Vital Signs BP (!) 164/123   Pulse 96   Temp 97.6 F (36.4 C)   Resp 17   Ht 5\' 7"  (1.702 m)   Wt 79.4 kg   SpO2 100%   BMI 27.41 kg/m  Physical Exam Vitals and nursing [name] reviewed.  Constitutional:      General: He is not in acute distress.    Appearance: He is well-developed. He is not ill-appearing.  HENT:     Head: Normocephalic and atraumatic.  Eyes:     Extraocular Movements: Extraocular movements intact.     Conjunctiva/sclera: Conjunctivae normal.     Pupils: Pupils are equal, round, and reactive to light.  Cardiovascular:     Rate and Rhythm: Normal rate and regular rhythm.     Pulses:          Radial pulses are 2+ on the right side and 2+ on the left side.     Heart sounds: Normal heart sounds. No murmur heard. Pulmonary:     Effort: Tachypnea present. No respiratory distress.     Breath sounds: Rales present.     Comments: Rales Abdominal:     Palpations: Abdomen is soft.     Tenderness: There is no abdominal tenderness.  Musculoskeletal:        General: No swelling. Normal range of motion.     Cervical back: Normal range of motion and neck supple.  Right lower leg: Edema present.     Left lower leg: Edema present.  Skin:    General: Skin is warm and dry.     Capillary Refill: Capillary refill takes less than 2 seconds.  Neurological:     Mental Status: He is alert.  Psychiatric:        Mood and Affect: Mood normal.     ED Results / Procedures / Treatments   Labs (all labs ordered are listed, but only abnormal results are displayed) Labs Reviewed  BASIC METABOLIC PANEL - Abnormal; Notable for the following components:      Result Value   BUN 26 (*)    Creatinine, Ser 1.30 (*)    Calcium 8.8 (*)    All other components within normal limits  CBC - Abnormal; Notable for the following components:   RBC 3.91 (*)    MCV 103.1 (*)    MCH 35.5 (*)    RDW 16.7 (*)    Platelets 113 (*)    All other components  within normal limits  BRAIN NATRIURETIC PEPTIDE - Abnormal; Notable for the following components:   B Natriuretic Peptide 1,563.5 (*)    All other components within normal limits  TROPONIN I (HIGH SENSITIVITY) - Abnormal; Notable for the following components:   Troponin I (High Sensitivity) 73 (*)    All other components within normal limits  TROPONIN I (HIGH SENSITIVITY) - Abnormal; Notable for the following components:   Troponin I (High Sensitivity) 65 (*)    All other components within normal limits  RAPID URINE DRUG SCREEN, HOSP PERFORMED    EKG None  Radiology DG Chest 2 View  Result Date: 02/27/2022 CLINICAL DATA:  Shortness of breath EXAM: CHEST - 2 VIEW COMPARISON:  11/01/2021 FINDINGS: Small pleural effusions. Cardiomegaly. No overt edema or focal airspace disease. IMPRESSION: Cardiomegaly with small pleural effusions. Electronically Signed   By: Donavan Foil M.D.   On: 02/27/2022 19:25    Procedures Procedures    Medications Ordered in ED Medications  furosemide (LASIX) injection 60 mg (has no administration in time range)    ED Course/ Medical Decision Making/ A&P                           Medical Decision Making Amount and/or Complexity of Data Reviewed Labs: ordered. Radiology: ordered.  Risk Prescription drug management. Decision regarding hospitalization.   Charles [name] is here with chest pain and shortness of breath.  History of heart failure, hepatitis, polysubstance abuse.  He states has been noncompliant with his medications.  He has been increasingly short of breath with exertion the last several days.  Continues to use cocaine.  Has not followed up with primary care doctor.  Has not taken his medications.  Appears volume overloaded on exam.  Rales with some peripheral edema.  Differential diagnosis is likely heart failure exacerbation in the setting cocaine abuse.  Seems less likely to be ACS or infectious process including pneumonia.  We will get  a CBC, BMP, troponin, BNP, chest x-ray.  EKG shows sinus rhythm.  PVCs.  No obvious ischemic changes.  Per my review and interpretation of labs, troponin is elevated at 73 and 65.  BNP is elevated at 1500.  Chest x-ray per my review and interpretation appears to be consistent with volume overload.  Per further review and interpretation of labs, creatinine is 1.3.  Hemoglobin is unremarkable.  Overall suspect heart failure exacerbation  in the setting of cocaine abuse and noncompliance.  We will give him a dose IV Lasix and have admitted to medicine for further optimization of his medications.  Patient hemodynamically stable throughout my care.  This chart was dictated using voice recognition software.  Despite best efforts to proofread,  errors can occur which can change the documentation meaning.         Final Clinical Impression(s) / ED Diagnoses Final diagnoses:  Acute on chronic right-sided heart failure (HCC)  Cocaine abuse [facility])    Rx / DC Orders ED Discharge Orders     None         Virgina Norfolk, DO 02/28/22 1355

## 2022-02-28 NOTE — ED Notes (Signed)
Pt. Eating a sandwich and drink
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
## 2022-03-01 ENCOUNTER — Inpatient Hospital Stay (HOSPITAL_COMMUNITY): Payer: Medicare Other

## 2022-03-01 DIAGNOSIS — I5023 Acute on chronic systolic (congestive) heart failure: Secondary | ICD-10-CM

## 2022-03-01 DIAGNOSIS — I5021 Acute systolic (congestive) heart failure: Secondary | ICD-10-CM

## 2022-03-01 DIAGNOSIS — D696 Thrombocytopenia, unspecified: Secondary | ICD-10-CM | POA: Diagnosis not present

## 2022-03-01 DIAGNOSIS — I160 Hypertensive urgency: Secondary | ICD-10-CM | POA: Diagnosis not present

## 2022-03-01 DIAGNOSIS — N182 Chronic kidney disease, stage 2 (mild): Secondary | ICD-10-CM | POA: Diagnosis not present

## 2022-03-01 LAB — ECHOCARDIOGRAM COMPLETE
AR max vel: 3.14 cm2
AV Area VTI: 3.62 cm2
AV Area mean vel: 2.9 cm2
AV Mean grad: 1 mmHg
AV Peak grad: 2.6 mmHg
Ao pk vel: 0.8 m/s
Area-P 1/2: 2.78 cm2
Calc EF: 45.4 %
Height: 67 in
S' Lateral: 4.1 cm
Single Plane A2C EF: 56.2 %
Single Plane A4C EF: 35.2 %
Weight: 2800 oz

## 2022-03-01 LAB — CBC
HCT: 40.2 % (ref 39.0–52.0)
Hemoglobin: 14.2 g/dL (ref 13.0–17.0)
MCH: 35.9 pg — ABNORMAL HIGH (ref 26.0–34.0)
MCHC: 35.3 g/dL (ref 30.0–36.0)
MCV: 101.8 fL — ABNORMAL HIGH (ref 80.0–100.0)
Platelets: 100 10*3/uL — ABNORMAL LOW (ref 150–400)
RBC: 3.95 MIL/uL — ABNORMAL LOW (ref 4.22–5.81)
RDW: 16.4 % — ABNORMAL HIGH (ref 11.5–15.5)
WBC: 3.7 10*3/uL — ABNORMAL LOW (ref 4.0–10.5)
nRBC: 0 % (ref 0.0–0.2)

## 2022-03-01 LAB — BASIC METABOLIC PANEL
Anion gap: 12 (ref 5–15)
BUN: 18 mg/dL (ref 6–20)
CO2: 24 mmol/L (ref 22–32)
Calcium: 8.4 mg/dL — ABNORMAL LOW (ref 8.9–10.3)
Chloride: 101 mmol/L (ref 98–111)
Creatinine, Ser: 1.24 mg/dL (ref 0.61–1.24)
GFR, Estimated: >60 mL/min (ref >60)
Glucose, Bld: 107 mg/dL — ABNORMAL HIGH (ref 70–99)
Potassium: 3.4 mmol/L — ABNORMAL LOW (ref 3.5–5.1)
Sodium: 137 mmol/L (ref 135–145)

## 2022-03-01 MED ORDER — POTASSIUM CHLORIDE CRYS ER 20 MEQ PO TBCR
40.0000 meq | EXTENDED_RELEASE_TABLET | ORAL | Status: AC
  Administered 2022-03-01 (×2): 40 meq via ORAL
  Filled 2022-03-01 (×2): qty 2

## 2022-03-01 MED ORDER — EMPAGLIFLOZIN 10 MG PO TABS
10.0000 mg | ORAL_TABLET | Freq: Every day | ORAL | Status: DC
  Administered 2022-03-01 – 2022-03-04 (×4): 10 mg via ORAL
  Filled 2022-03-01 (×4): qty 1

## 2022-03-01 MED ORDER — SPIRONOLACTONE 12.5 MG HALF TABLET
12.5000 mg | ORAL_TABLET | Freq: Every day | ORAL | Status: DC
  Administered 2022-03-01 – 2022-03-04 (×4): 12.5 mg via ORAL
  Filled 2022-03-01 (×4): qty 1

## 2022-03-01 NOTE — Assessment & Plan Note (Addendum)
Hypokalemia, hyponatremia.   At the time of his discharge his renal function is stable with serum cr at 1,1 with K at 4,0 and serum bicarbonate at 26. Plan to follow up renal function as outpatient, continue diuresis with furosemide along with K supplementation.

## 2022-03-01 NOTE — Hospital Course (Signed)
Mr. Viernes was admitted to the hospital with the working diagnosis of heart failure decompensation.  51 yo male with the past medical history of hypertension, heart failure, hep C, aplastic anemia, and cocaine use, who presented with dyspnea. Reported 10 days of progressive dyspnea, worse with exertion, positive orthopnea, PND and lower extremity edema. Use cocaine one week prior. On his initial physical examination his blood pressure was 184/131, HR 75, RR 16 and 02 saturation 100%, lungs with rales bilaterally, heart with S1 and S2 present and regular, abdomen with no distention and trace lower extremity edema.   NA 139, K 4.1 cl 110 bicarbonate 23 glucose 85 bun 26 cr 1,30  Wbc 4.0 hgb 13,9 plt 113  Urine analysis SG 1,008  Toxicology negative   Chest radiograph with cardiomegaly, bilateral interstitial infiltrates, with cephalization of the vasculature. No effusions   EKG 79 bpm, normal axis, normal intervals, sinus rhythm with poor R R wave progression, no significant ST segment changes, negative T wave lead II, III and AvF, positive LVH.   Patient has been placed on furosemide along with guideline directed medical therapy. Clinically has been improving. He has been advised to be compliant with his medications and avoid cocaine.

## 2022-03-01 NOTE — Assessment & Plan Note (Signed)
Avoid cocaine.   °

## 2022-03-01 NOTE — Assessment & Plan Note (Signed)
Continue blood pressure control with spironolactone, losartan. and carvedilol. Holding on hydralazine (tid medication) to improve compliance.

## 2022-03-01 NOTE — Assessment & Plan Note (Signed)
Smoking cessation  

## 2022-03-01 NOTE — ED Notes (Signed)
Pt in room A&O x4. Updated on plan of care. Pt given crackers and soda. Denies any other needs at this time. Room adjusted for comfort.

## 2022-03-01 NOTE — Progress Notes (Signed)
  Echocardiogram 2D Echocardiogram has been performed.  Charles [name] 03/01/2022, 1:40 PM

## 2022-03-01 NOTE — Progress Notes (Signed)
  Progress Note   Patient: Charles [name] PTW:[id number] DOB: [birth year] DOA: 02/27/2022     1 DOS: the patient was seen and examined on 03/01/2022   Brief hospital course: Charles [name] was admitted to the hospital with the working diagnosis of heart failure decompensation.  51 yo male with the past medical history of hypertension, heart failure, hep C, aplastic anemia, and cocaine use, who presented with dyspnea. Reported 10 days of progressive dyspnea, worse with exertion, positive orthopnea, PND and lower extremity edema. Use cocaine one week prior. On his initial physical examination his blood pressure was 184/131, HR 75, RR 16 and 02 saturation 100%, lungs with rales bilaterally, heart with S1 and S2 present and regular, abdomen with no distention and trace lower extremity edema.   NA 139, K 4.1 cl 110 bicarbonate 23 glucose 85 bun 26 cr 1,30  Wbc 4.0 hgb 13,9 plt 113  Urine analysis SG 1,008  Toxicology negative   Chest radiograph with cardiomegaly, bilateral interstitial infiltrates, with cephalization of the vasculature. No effusions   EKG 79 bpm, normal axis, normal intervals, sinus rhythm with poor R R wave progression, no significant ST segment changes, negative T wave lead II, III and AvF, positive LVH.   Assessment and Plan: * Acute on chronic systolic CHF (congestive heart failure) (HCC) Echocardiogram with reduced LV systolic function with EF 30 to 35%, with global hypokinesis, moderate RVH, RV with moderate systolic reduction,   Urine output is 7,001 ml Systolic blood pressure 749 to 103 mmHg.   Plan to continue diuresis with furosemide Add SGLT 2 inh and spironolactone  Continue with losartan  Hold on hydralazine to improve compliance with outpatient medications.   Hypertensive urgency Blood pressure has improved with diuresis  Plan to continue with losartan, add spironolactone and SGLT 2 inh   CKD (chronic kidney disease), stage II Hypokalemia   Renal function  with serum cr at 1,24 with K at 3,4 and serum bicarbonate at 24   Add Kcl 40 meq x2 and follow up renal function in am, including serum Mg.  Avoid hypotension and nephrotoxic medications.   Thrombocytopenia (HCC) Cell count has been stable.   Cocaine abuse ([place]) Avoid cocaine   Tobacco use disorder Smoking cessation         Subjective: Patient with improvement in dyspnea, but not back to baseline   Physical Exam: Vitals:   03/01/22 0900 03/01/22 1215 03/01/22 1335 03/01/22 1449  BP: 106/67  109/85 103/68  Pulse: 86  85 90  Resp: 15  15 18   Temp:  97.8 F (36.6 C) 98.2 F (36.8 C) 98.2 F (36.8 C)  TempSrc:  Oral Oral Axillary  SpO2: 98%  100% 98%  Weight:    69.3 kg  Height:    5\' 6"  (1.676 m)   Neurology awake and alert ENT with no pallor Cardiovascular with S1 and S2 present and rhythmic with no gallops, rubs or murmurs Mild JVD No lower extremity edema Respiratory with mild rales at bases Abdomen not distended  Data Reviewed:    Family Communication: no family at the bedside   Disposition: Status is: Inpatient Remains inpatient appropriate because: heart failure   Planned Discharge Destination: Home     Author: Tawni Millers, MD 03/01/2022 4:06 PM  For on call review [email or web address].

## 2022-03-01 NOTE — Assessment & Plan Note (Signed)
Echocardiogram with reduced LV systolic function with EF 30 to 35%, with global hypokinesis, moderate RVH, RV with moderate systolic reduction.  He has non ischemic cardiomyopathy, likely secondary to cocaine use, cardiac MRI with no infiltrative disease or hypertrophic disease.   Patient was placed on IV furosemide for diuresis, negative fluid balance was achieved, -9,094 ml, with significant improvement in his symptoms.   Patient will continue heart failure regimen with losartan, empagliflozin and spironolactone Added carvedilol but patient did not tolerate due to dizziness.

## 2022-03-01 NOTE — Progress Notes (Signed)
CSW added substance abuse and shelter resources to patient's AVS.  Tamla Winkels, MSW, LCSW Transitions of Care  Clinical Social Worker II [phone]  

## 2022-03-01 NOTE — Assessment & Plan Note (Signed)
Cell count has been stable.  

## 2022-03-02 LAB — BASIC METABOLIC PANEL
Anion gap: 6 (ref 5–15)
BUN: 21 mg/dL — ABNORMAL HIGH (ref 6–20)
CO2: 23 mmol/L (ref 22–32)
Calcium: 8.6 mg/dL — ABNORMAL LOW (ref 8.9–10.3)
Chloride: 105 mmol/L (ref 98–111)
Creatinine, Ser: 1.24 mg/dL (ref 0.61–1.24)
GFR, Estimated: >60 mL/min (ref >60)
Glucose, Bld: 101 mg/dL — ABNORMAL HIGH (ref 70–99)
Potassium: 4.3 mmol/L (ref 3.5–5.1)
Sodium: 134 mmol/L — ABNORMAL LOW (ref 135–145)

## 2022-03-02 LAB — MAGNESIUM: Magnesium: 2.2 mg/dL (ref 1.7–2.4)

## 2022-03-02 MED ORDER — CARVEDILOL 3.125 MG PO TABS
3.1250 mg | ORAL_TABLET | Freq: Two times a day (BID) | ORAL | Status: DC
  Administered 2022-03-02 – 2022-03-04 (×4): 3.125 mg via ORAL
  Filled 2022-03-02 (×4): qty 1

## 2022-03-03 DIAGNOSIS — D696 Thrombocytopenia, unspecified: Secondary | ICD-10-CM | POA: Diagnosis not present

## 2022-03-03 DIAGNOSIS — I160 Hypertensive urgency: Secondary | ICD-10-CM | POA: Diagnosis not present

## 2022-03-03 DIAGNOSIS — N182 Chronic kidney disease, stage 2 (mild): Secondary | ICD-10-CM | POA: Diagnosis not present

## 2022-03-03 DIAGNOSIS — I5023 Acute on chronic systolic (congestive) heart failure: Secondary | ICD-10-CM | POA: Diagnosis not present

## 2022-03-03 LAB — BASIC METABOLIC PANEL
Anion gap: 6 (ref 5–15)
BUN: 18 mg/dL (ref 6–20)
CO2: 25 mmol/L (ref 22–32)
Calcium: 8.8 mg/dL — ABNORMAL LOW (ref 8.9–10.3)
Chloride: 104 mmol/L (ref 98–111)
Creatinine, Ser: 1.1 mg/dL (ref 0.61–1.24)
GFR, Estimated: >60 mL/min (ref >60)
Glucose, Bld: 101 mg/dL — ABNORMAL HIGH (ref 70–99)
Potassium: 4.3 mmol/L (ref 3.5–5.1)
Sodium: 135 mmol/L (ref 135–145)

## 2022-03-03 MED ORDER — FUROSEMIDE 20 MG PO TABS: 20.0000 mg | ORAL_TABLET | Freq: Every day | ORAL | Status: DC

## 2022-03-03 MED ORDER — FUROSEMIDE 10 MG/ML IJ SOLN
40.0000 mg | Freq: Once | INTRAMUSCULAR | Status: AC
  Administered 2022-03-03: 40 mg via INTRAVENOUS
  Filled 2022-03-03: qty 4

## 2022-03-03 NOTE — Progress Notes (Signed)
  Progress Note   Patient: Charles [name] KLK:[id number] DOB: December 20, 1970 DOA: 02/27/2022     3 DOS: the patient was seen and examined on 03/03/2022   Brief hospital course: Charles [name] was admitted to the hospital with the working diagnosis of heart failure decompensation.  51 yo male with the past medical history of hypertension, heart failure, hep C, aplastic anemia, and cocaine use, who presented with dyspnea. Reported 10 days of progressive dyspnea, worse with exertion, positive orthopnea, PND and lower extremity edema. Use cocaine one week prior. On his initial physical examination his blood pressure was 184/131, HR 75, RR 16 and 02 saturation 100%, lungs with rales bilaterally, heart with S1 and S2 present and regular, abdomen with no distention and trace lower extremity edema.   NA 139, K 4.1 cl 110 bicarbonate 23 glucose 85 bun 26 cr 1,30  Wbc 4.0 hgb 13,9 plt 113  Urine analysis SG 1,008  Toxicology negative   Chest radiograph with cardiomegaly, bilateral interstitial infiltrates, with cephalization of the vasculature. No effusions   EKG 79 bpm, normal axis, normal intervals, sinus rhythm with poor R R wave progression, no significant ST segment changes, negative T wave lead II, III and AvF, positive LVH.   Patient has been placed on furosemide along with guideline directed medical therapy. Clinically has been improving, possible discharge home in 24 hrs, if continue to improve.   Assessment and Plan: * Acute on chronic systolic CHF (congestive heart failure) (HCC) Echocardiogram with reduced LV systolic function with EF 30 to 35%, with global hypokinesis, moderate RVH, RV with moderate systolic reduction,   Urine output is 9,794 ml Systolic blood pressure 801 mmHg.   Patient with JVD and dyspnea Plan to give furosemide IV 40 mg today and resume oral furosemide in am.  Continue with losartan, empagliflozin and spironolactone His heart rate has been in the 80s tolerating well  carvedilol, he continue to have PAC, personally reviewed telemetry.   Hypertensive urgency  Continue blood pressure control with spironolactone, losartan. and carvedilol. Holding on hydralazine (tid medication) to improve compliance.    CKD (chronic kidney disease), stage II Hypokalemia, hyponatremia.   Blood work is pending for this am, will follow up on electrolytes and renal function. IV furosemide today.   Thrombocytopenia (HCC) Cell count has been stable.   Cocaine abuse ([place]) Avoid cocaine   Tobacco use disorder Smoking cessation         Subjective: Patient is feeling better, but continue to have dyspnea, no PND or orthopnea.  Physical Exam: Vitals:   03/02/22 1926 03/03/22 0354 03/03/22 0630 03/03/22 0736  BP: (!) 146/70 (!) 155/73  (!) 152/81  Pulse: (!) 43 (!) 45 91 98  Resp: 18 20  18   Temp: 98.1 F (36.7 C) 97.6 F (36.4 C)  98.4 F (36.9 C)  TempSrc: Oral Oral  Oral  SpO2: 100% 100%  98%  Weight:  70.1 kg    Height:       Neurology awake and alert ENT with mild pallor Cardiovascular with S1 and S2 present with no gallops or murmurs Positive moderate JVD Respiratory with scattered rales, no wheezing Abdomen with no distention  No lower extremity edema  Data Reviewed:    Family Communication: no family at the bedside   Disposition: Status is: Inpatient Remains inpatient appropriate because: heart failure   Planned Discharge Destination: Home     Author: Tawni Millers, MD 03/03/2022 9:55 AM  For on call review [email or web address].

## 2022-03-04 ENCOUNTER — Other Ambulatory Visit (HOSPITAL_COMMUNITY): Payer: Self-pay

## 2022-03-04 DIAGNOSIS — D696 Thrombocytopenia, unspecified: Secondary | ICD-10-CM | POA: Diagnosis not present

## 2022-03-04 DIAGNOSIS — N182 Chronic kidney disease, stage 2 (mild): Secondary | ICD-10-CM | POA: Diagnosis not present

## 2022-03-04 DIAGNOSIS — I5023 Acute on chronic systolic (congestive) heart failure: Secondary | ICD-10-CM | POA: Diagnosis not present

## 2022-03-04 DIAGNOSIS — I160 Hypertensive urgency: Secondary | ICD-10-CM | POA: Diagnosis not present

## 2022-03-04 LAB — BASIC METABOLIC PANEL
Anion gap: 8 (ref 5–15)
BUN: 24 mg/dL — ABNORMAL HIGH (ref 6–20)
CO2: 26 mmol/L (ref 22–32)
Calcium: 8.9 mg/dL (ref 8.9–10.3)
Chloride: 101 mmol/L (ref 98–111)
Creatinine, Ser: 1.18 mg/dL (ref 0.61–1.24)
GFR, Estimated: >60 mL/min (ref >60)
Glucose, Bld: 100 mg/dL — ABNORMAL HIGH (ref 70–99)
Potassium: 4 mmol/L (ref 3.5–5.1)
Sodium: 135 mmol/L (ref 135–145)

## 2022-03-04 MED ORDER — SPIRONOLACTONE 25 MG PO TABS
12.5000 mg | ORAL_TABLET | Freq: Every day | ORAL | 0 refills | Status: DC
  Filled 2022-03-04: qty 15, 30d supply, fill #0

## 2022-03-04 MED ORDER — LOSARTAN POTASSIUM 25 MG PO TABS
25.0000 mg | ORAL_TABLET | Freq: Every day | ORAL | 0 refills | Status: DC
  Filled 2022-03-04: qty 30, 30d supply, fill #0

## 2022-03-04 MED ORDER — EMPAGLIFLOZIN 10 MG PO TABS
10.0000 mg | ORAL_TABLET | Freq: Every day | ORAL | 0 refills | Status: AC
  Filled 2022-03-04: qty 30, 30d supply, fill #0

## 2022-03-04 MED ORDER — FUROSEMIDE 20 MG PO TABS
20.0000 mg | ORAL_TABLET | Freq: Every day | ORAL | 0 refills | Status: DC
  Filled 2022-03-04: qty 30, 30d supply, fill #0

## 2022-03-04 MED ORDER — POTASSIUM CHLORIDE CRYS ER 10 MEQ PO TBCR
10.0000 meq | EXTENDED_RELEASE_TABLET | Freq: Every day | ORAL | 0 refills | Status: DC
  Filled 2022-03-04: qty 30, 30d supply, fill #0

## 2022-03-04 MED ORDER — FUROSEMIDE 20 MG PO TABS: 20.0000 mg | ORAL_TABLET | Freq: Every day | ORAL | Status: DC

## 2022-03-04 MED ORDER — POTASSIUM CHLORIDE CRYS ER 10 MEQ PO TBCR: 10.0000 meq | EXTENDED_RELEASE_TABLET | Freq: Every day | ORAL | Status: DC

## 2022-03-04 NOTE — TOC Transition Note (Signed)
Transition of Care [facility]) - CM/SW Discharge Note   Patient Details  Name: Charles [name] MRN: [id number] Date of Birth: 12/18/1970  [facility] CM/SW Contact:  Zenon Mayo, RN Phone Number: 03/04/2022, 9:52 AM   Clinical Narrative:    Patient is for dc today, he states he needs a bus pass. NCM gave him a bus pass to transport home.  Patient states that he is not leaving til after lunch, because he is not feeling well and the doctor said he will check back with him to see how he is feeling.          Patient Goals and CMS Choice        Discharge Placement                       Discharge Plan and Services                                     Social Determinants of Health (SDOH) Interventions     Readmission Risk Interventions    05/28/2021    2:11 PM  Readmission Risk Prevention Plan  Transportation Screening Complete  PCP or Specialist Appt within 3-5 Days Complete  HRI or [place] Complete  Social Work Consult for [place] Planning/Counseling Complete  Palliative Care Screening Not Applicable  Medication Review [employer]) Referral to Pharmacy

## 2022-03-04 NOTE — Progress Notes (Signed)
   03/04/22 1053  Mobility  Activity Ambulated independently in hallway  Level of Assistance Independent  Assistive Device None  Distance Ambulated (ft) 400 ft  Activity Response Tolerated well  Mobility Referral Yes  $Mobility charge 1 Mobility   Mobility Specialist Progress Note  During Mobility: 94% SpO2  Received pt in bed having no complaints and agreeable to mobility. Pt was asymptomatic throughout ambulation and returned to room w/o fault. Left EOB w/ call bell in reach and all needs met.   Lucious Groves Mobility Specialist

## 2022-03-04 NOTE — Discharge Summary (Signed)
Physician Discharge Summary   Patient: Charles [name] MRN: [id number] DOB: Oct 08, 1970  Admit date:     02/27/2022  Discharge date: 03/04/22  Discharge Physician: Charles [name]   PCP: Charles Lei, MD   Recommendations at discharge:    Patient has been advised to be compliant with his medications and avoid cocaine.  Holding hydralazine for now to make his medical regimen more simple and improve compliance. Patient placed on spironolactone and empagliflozin  Patient had dizziness with carvedilol. Continue diuresis with oral furosemide, added K supplementation.  Follow up with Charles [name] in 7 to 10 days Follow up renal function and electrolytes in 7 days as outpatient.  Follow up with Cardiology as outpatient.   Discharge Diagnoses: Principal Problem:   Acute on chronic systolic CHF (congestive heart failure) (HCC) Active Problems:   Hypertensive urgency   CKD (chronic kidney disease), stage II   Thrombocytopenia (HCC)   Cocaine abuse ([place])   Tobacco use disorder  Resolved Problems:   * No resolved hospital problems. [facility] Course: Charles [name] was admitted to the hospital with the working diagnosis of heart failure decompensation.  51 yo male with the past medical history of hypertension, heart failure, hep C, aplastic anemia, and cocaine use, who presented with dyspnea. Reported 10 days of progressive dyspnea, worse with exertion, positive orthopnea, PND and lower extremity edema. Use cocaine one week prior. On his initial physical examination his blood pressure was 184/131, HR 75, RR 16 and 02 saturation 100%, lungs with rales bilaterally, heart with S1 and S2 present and regular, abdomen with no distention and trace lower extremity edema.   NA 139, K 4.1 cl 110 bicarbonate 23 glucose 85 bun 26 cr 1,30  Wbc 4.0 hgb 13,9 plt 113  Urine analysis SG 1,008  Toxicology negative   Chest radiograph with cardiomegaly, bilateral interstitial infiltrates, with cephalization  of the vasculature. No effusions   EKG 79 bpm, normal axis, normal intervals, sinus rhythm with poor R R wave progression, no significant ST segment changes, negative T wave lead II, III and AvF, positive LVH.   Patient has been placed on furosemide along with guideline directed medical therapy. Clinically has been improving. He has been advised to be compliant with his medications and avoid cocaine.   Assessment and Plan: * Acute on chronic systolic CHF (congestive heart failure) (HCC) Echocardiogram with reduced LV systolic function with EF 30 to 35%, with global hypokinesis, moderate RVH, RV with moderate systolic reduction.  He has non ischemic cardiomyopathy, likely secondary to cocaine use, cardiac MRI with no infiltrative disease or hypertrophic disease.   Patient was placed on IV furosemide for diuresis, negative fluid balance was achieved, -9,094 ml, with significant improvement in his symptoms.   Patient will continue heart failure regimen with losartan, empagliflozin and spironolactone Added carvedilol but patient did not tolerate due to dizziness.   Hypertensive urgency  Continue blood pressure control with spironolactone and losartan. Holding on hydralazine (tid medication) to improve compliance.    CKD (chronic kidney disease), stage II Hypokalemia, hyponatremia.   At the time of his discharge his renal function is stable with serum cr at 1,1 with K at 4,0 and serum bicarbonate at 26. Plan to follow up renal function as outpatient, continue diuresis with furosemide along with K supplementation.   Thrombocytopenia (HCC) Cell count has been stable.   Cocaine abuse (HCC) Avoid cocaine   Tobacco use disorder Smoking cessation  Consultants: none  Procedures performed: none   Disposition: Home Diet recommendation:  Cardiac diet DISCHARGE MEDICATION: Allergies as of 03/04/2022       Reactions   Eggs Or Egg-derived Products Nausea And Vomiting    Banana Nausea And Vomiting   Pork-derived Products Nausea And Vomiting        Medication List     STOP taking these medications    carvedilol 12.5 MG tablet Commonly known as: COREG   chlorthalidone 25 MG tablet Commonly known as: HYGROTON   hydrALAZINE 50 MG tablet Commonly known as: APRESOLINE   isosorbide mononitrate 30 MG 24 hr tablet Commonly known as: IMDUR       TAKE these medications    acetaminophen 325 MG tablet Commonly known as: TYLENOL Take 1,300-1,950 mg by mouth daily as needed for mild pain or headache.   empagliflozin 10 MG Tabs tablet Commonly known as: JARDIANCE Take 1 tablet (10 mg total) by mouth daily.   furosemide 20 MG tablet Commonly known as: LASIX Take 1 tablet (20 mg total) by mouth daily. Start taking on: March 05, 2022   losartan 25 MG tablet Commonly known as: COZAAR Take 1 tablet (25 mg total) by mouth daily.   potassium chloride 10 MEQ tablet Commonly known as: KLOR-CON M Take 1 tablet (10 mEq total) by mouth daily.   spironolactone 25 MG tablet Commonly known as: ALDACTONE Take 0.5 tablets (12.5 mg total) by mouth daily.        Discharge Exam: Filed Weights   03/02/22 0332 03/03/22 0354 03/04/22 0543  Weight: 69.7 kg 70.1 kg 72.8 kg   BP (!) 142/73 (BP Location: Left Arm)   Pulse 77   Temp 98.2 F (36.8 C) (Oral)   Resp 19   Ht 5\' 6"  (1.676 m)   Wt 72.8 kg   SpO2 98%   BMI 25.90 kg/m   Patient with no dyspnea or chest pain  Neurology awake and alert ENT with no pallor Cardiovascular with S1 and S2 present and rhythmic No JVD No lower extremity edema  Respiratory with no rales or wheezing Abdomen with no distentio   Condition at discharge: stable  The results of significant diagnostics from this hospitalization (including imaging, microbiology, ancillary and laboratory) are listed below for reference.   Imaging Studies: ECHOCARDIOGRAM COMPLETE  Result Date: 03/01/2022    ECHOCARDIOGRAM  REPORT   Patient Name:   Charles [name] Date of Exam: 03/01/2022 Medical Rec #:  [id number]       Height:       67.0 in Accession #:    [id number]      Weight:       175.0 lb Date of Birth:  1970/06/11       BSA:          1.911 m Patient Age:    67 years        BP:           106/67 mmHg Patient Gender: M               HR:           65 bpm. Exam Location:  Inpatient Procedure: 2D Echo, Cardiac Doppler and Color Doppler Indications:    CHF-acute systolic  History:        Patient has prior history of Echocardiogram examinations, most                 recent 05/28/2021. CHF; Risk Factors:Current Smoker and  Hypertension. Elevated troponin. CKD. Cocaine abuse.  Sonographer:    Clayton Lefort RDCS (AE) Referring Phys: [id number] RONDELL A SMITH IMPRESSIONS  1. Apical windows are very foreshortened. Diffuse hypokinesis, inferior akinesis.. Left ventricular ejection fraction, by estimation, is 30 to 35%. The left ventricle has moderately decreased function. The left ventricle demonstrates global hypokinesis.  There is moderate left ventricular hypertrophy.  2. Right ventricular systolic function is moderately reduced. The right ventricular size is mildly enlarged.  3. Trivial mitral valve regurgitation.  4. The aortic valve is grossly normal. Aortic valve regurgitation is not visualized.  5. The inferior vena cava is normal in size with greater than 50% respiratory variability, suggesting right atrial pressure of 3 mmHg. Comparison(s): The left ventricular function is unchanged. FINDINGS  Left Ventricle: Apical windows are very foreshortened. Diffuse hypokinesis, inferior akinesis. Left ventricular ejection fraction, by estimation, is 30 to 35%. The left ventricle has moderately decreased function. The left ventricle demonstrates global hypokinesis. The left ventricular internal cavity size was normal in size. There is moderate left ventricular hypertrophy. Right Ventricle: The right ventricular size is mildly  enlarged. Right vetricular wall thickness was not assessed. Right ventricular systolic function is moderately reduced. Left Atrium: Left atrial size was normal in size. Right Atrium: Right atrial size was normal in size. Pericardium: There is no evidence of pericardial effusion. Mitral Valve: There is mild thickening of the mitral valve leaflet(s). Trivial mitral valve regurgitation. Tricuspid Valve: The tricuspid valve is normal in structure. Tricuspid valve regurgitation is trivial. Aortic Valve: The aortic valve is grossly normal. Aortic valve regurgitation is not visualized. Aortic valve mean gradient measures 1.0 mmHg. Aortic valve peak gradient measures 2.6 mmHg. Aortic valve area, by VTI measures 3.62 cm. Pulmonic Valve: The pulmonic valve was not well visualized. Aorta: The aortic root is normal in size and structure. Venous: The inferior vena cava is normal in size with greater than 50% respiratory variability, suggesting right atrial pressure of 3 mmHg. IAS/Shunts: No atrial level shunt detected by color flow Doppler.  LEFT VENTRICLE PLAX 2D LVIDd:         4.80 cm      Diastology LVIDs:         4.10 cm      LV e' medial:    4.46 cm/s LV PW:         1.40 cm      LV E/e' medial:  13.0 LV IVS:        1.10 cm      LV e' lateral:   4.03 cm/s LVOT diam:     2.10 cm      LV E/e' lateral: 14.4 LV SV:         41 LV SV Index:   22 LVOT Area:     3.46 cm  LV Volumes (MOD) LV vol d, MOD A2C: 102.0 ml LV vol d, MOD A4C: 75.6 ml LV vol s, MOD A2C: 44.7 ml LV vol s, MOD A4C: 49.0 ml LV SV MOD A2C:     57.3 ml LV SV MOD A4C:     75.6 ml LV SV MOD BP:      41.0 ml RIGHT VENTRICLE RV Basal diam:  2.90 cm RV S prime:     10.10 cm/s TAPSE (M-mode): 1.8 cm LEFT ATRIUM           Index        RIGHT ATRIUM           Index LA diam:  2.50 cm 1.31 cm/m   RA Area:     15.00 cm LA Vol (A2C): 21.2 ml 11.10 ml/m  RA Volume:   34.00 ml  17.79 ml/m LA Vol (A4C): 28.5 ml 14.92 ml/m  AORTIC VALVE AV Area (Vmax):    3.14 cm AV  Area (Vmean):   2.90 cm AV Area (VTI):     3.62 cm AV Vmax:           80.40 cm/s AV Vmean:          51.900 cm/s AV VTI:            0.114 m AV Peak Grad:      2.6 mmHg AV Mean Grad:      1.0 mmHg LVOT Vmax:         72.80 cm/s LVOT Vmean:        43.500 cm/s LVOT VTI:          0.119 m LVOT/AV VTI ratio: 1.04  AORTA Ao Root diam: 3.70 cm MITRAL VALVE MV Area (PHT): 2.78 cm    SHUNTS MV Decel Time: 273 msec    Systemic VTI:  0.12 m MV E velocity: 58.10 cm/s  Systemic Diam: 2.10 cm MV A velocity: 42.80 cm/s MV E/A ratio:  1.36 Dorris Carnes MD Electronically signed by Dorris Carnes MD Signature Date/Time: 03/01/2022/3:25:59 PM    Final    DG Chest 2 View  Result Date: 02/27/2022 CLINICAL DATA:  Shortness of breath EXAM: CHEST - 2 VIEW COMPARISON:  11/01/2021 FINDINGS: Small pleural effusions. Cardiomegaly. No overt edema or focal airspace disease. IMPRESSION: Cardiomegaly with small pleural effusions. Electronically Signed   By: Donavan Foil M.D.   On: 02/27/2022 19:25    Microbiology: Results for orders placed or performed during the hospital encounter of 05/26/21  Resp Panel by RT-PCR (Flu A&B, Covid) Nasopharyngeal Swab     Status: None   Collection Time: 05/26/21  4:40 AM   Specimen: Nasopharyngeal Swab; Nasopharyngeal(NP) swabs in vial transport medium  Result Value Ref Range Status   SARS Coronavirus 2 by RT PCR NEGATIVE NEGATIVE Final    Comment: (NOTE) SARS-CoV-2 target nucleic acids are NOT DETECTED.  The SARS-CoV-2 RNA is generally detectable in upper respiratory specimens during the acute phase of infection. The lowest concentration of SARS-CoV-2 viral copies this assay can detect is 138 copies/mL. A negative result does not preclude SARS-Cov-2 infection and should not be used as the sole basis for treatment or other patient management decisions. A negative result may occur with  improper specimen collection/handling, submission of specimen other than nasopharyngeal swab, presence of viral  mutation(s) within the areas targeted by this assay, and inadequate number of viral copies(<138 copies/mL). A negative result must be combined with clinical observations, patient history, and epidemiological information. The expected result is Negative.  Fact Sheet for Patients:  EntrepreneurPulse.com.au  Fact Sheet for Healthcare Providers:  IncredibleEmployment.be  This test is no t yet approved or cleared by the [place] FDA and  has been authorized for detection and/or diagnosis of SARS-CoV-2 by FDA under an Emergency Use Authorization (EUA). This EUA will remain  in effect (meaning this test can be used) for the duration of the COVID-19 declaration under Section 564(b)(1) of the Act, 21 U.S.C.section 360bbb-3(b)(1), unless the authorization is terminated  or revoked sooner.       Influenza A by PCR NEGATIVE NEGATIVE Final   Influenza B by PCR NEGATIVE NEGATIVE Final    Comment: (NOTE) The Xpert Xpress  SARS-CoV-2/FLU/RSV plus assay is intended as an aid in the diagnosis of influenza from Nasopharyngeal swab specimens and should not be used as a sole basis for treatment. Nasal washings and aspirates are unacceptable for Xpert Xpress SARS-CoV-2/FLU/RSV testing.  Fact Sheet for Patients: EntrepreneurPulse.com.au  Fact Sheet for Healthcare Providers: IncredibleEmployment.be  This test is not yet approved or cleared by the [place] FDA and has been authorized for detection and/or diagnosis of SARS-CoV-2 by FDA under an Emergency Use Authorization (EUA). This EUA will remain in effect (meaning this test can be used) for the duration of the COVID-19 declaration under Section 564(b)(1) of the Act, 21 U.S.C. section 360bbb-3(b)(1), unless the authorization is terminated or revoked.  Performed at [facility], [place] [address], [place], [place] [address]   Blood Culture (routine x 2)      Status: Abnormal   Collection Time: 05/26/21  5:09 AM   Specimen: BLOOD  Result Value Ref Range Status   Specimen Description BLOOD SITE NOT SPECIFIED  Final   Special Requests   Final    BOTTLES DRAWN AEROBIC AND ANAEROBIC Blood Culture adequate volume   Culture  Setup Time   Final    GRAM NEGATIVE RODS IN BOTH AEROBIC AND ANAEROBIC BOTTLES CRITICAL VALUE NOTED.  VALUE IS CONSISTENT WITH PREVIOUSLY REPORTED AND CALLED VALUE.    Culture (A)  Final    ACINETOBACTER LWOFFII STAPHYLOCOCCUS COHNII LACTOBACILLUS SPECIES Standardized susceptibility testing for this organism is not available. Performed at [facility], [place] [address], [place], [place] [address]    Report Status 05/31/2021 FINAL  Final  Blood Culture (routine x 2)     Status: Abnormal   Collection Time: 05/26/21  5:14 AM   Specimen: BLOOD  Result Value Ref Range Status   Specimen Description BLOOD SITE NOT SPECIFIED  Final   Special Requests   Final    BOTTLES DRAWN AEROBIC AND ANAEROBIC Blood Culture adequate volume   Culture  Setup Time   Final    GRAM NEGATIVE RODS AEROBIC BOTTLE ONLY CRITICAL RESULT CALLED TO, READ BACK BY AND VERIFIED WITH: PHARMD JAMES LEDFORD 05/27/21@1 :15 BY TW    Culture (A)  Final    PANTOEA AGGLOMERANS ACINETOBACTER LWOFFII SEE SEPARATE REPORT FOR SUSCEPTIBILITY RESULTS Performed at [facility], [place] [address], [place], [place] [address]    Report Status 06/11/2021 FINAL  Final   Organism ID, Bacteria PANTOEA AGGLOMERANS  Final      Susceptibility   Pantoea agglomerans - MIC*    CEFAZOLIN <=4 SENSITIVE Sensitive     CEFEPIME <=0.12 SENSITIVE Sensitive     CEFTAZIDIME <=1 SENSITIVE Sensitive     CEFTRIAXONE <=0.25 SENSITIVE Sensitive     CIPROFLOXACIN <=0.25 SENSITIVE Sensitive     GENTAMICIN <=1 SENSITIVE Sensitive     IMIPENEM 0.5 SENSITIVE Sensitive     TRIMETH/SULFA <=20 SENSITIVE Sensitive     PIP/TAZO <=4 SENSITIVE Sensitive     * PANTOEA AGGLOMERANS  Blood  Culture ID Panel (Reflexed)     Status: Abnormal   Collection Time: 05/26/21  5:14 AM  Result Value Ref Range Status   Enterococcus faecalis NOT DETECTED NOT DETECTED Final   Enterococcus Faecium NOT DETECTED NOT DETECTED Final   Listeria monocytogenes NOT DETECTED NOT DETECTED Final   Staphylococcus species NOT DETECTED NOT DETECTED Final   Staphylococcus aureus (BCID) NOT DETECTED NOT DETECTED Final   Staphylococcus epidermidis NOT DETECTED NOT DETECTED Final   Staphylococcus lugdunensis NOT DETECTED NOT DETECTED  Final   Streptococcus species NOT DETECTED NOT DETECTED Final   Streptococcus agalactiae NOT DETECTED NOT DETECTED Final   Streptococcus pneumoniae NOT DETECTED NOT DETECTED Final   Streptococcus pyogenes NOT DETECTED NOT DETECTED Final   A.calcoaceticus-baumannii NOT DETECTED NOT DETECTED Final   Bacteroides fragilis NOT DETECTED NOT DETECTED Final   Enterobacterales DETECTED (A) NOT DETECTED Final    Comment: Enterobacterales represent a large order of gram negative bacteria, not a single organism. Refer to culture for further identification. CRITICAL RESULT CALLED TO, READ BACK BY AND VERIFIED WITH: PHARMD JAMES LEDFORD 05/27/21@1 :15 BY TW    Enterobacter cloacae complex NOT DETECTED NOT DETECTED Final   Escherichia coli NOT DETECTED NOT DETECTED Final   Klebsiella aerogenes NOT DETECTED NOT DETECTED Final   Klebsiella oxytoca NOT DETECTED NOT DETECTED Final   Klebsiella pneumoniae NOT DETECTED NOT DETECTED Final   Proteus species NOT DETECTED NOT DETECTED Final   Salmonella species NOT DETECTED NOT DETECTED Final   Serratia marcescens NOT DETECTED NOT DETECTED Final   Haemophilus influenzae NOT DETECTED NOT DETECTED Final   Neisseria meningitidis NOT DETECTED NOT DETECTED Final   Pseudomonas aeruginosa NOT DETECTED NOT DETECTED Final   Stenotrophomonas maltophilia NOT DETECTED NOT DETECTED Final   Candida albicans NOT DETECTED NOT DETECTED Final   Candida auris NOT  DETECTED NOT DETECTED Final   Candida glabrata NOT DETECTED NOT DETECTED Final   Candida krusei NOT DETECTED NOT DETECTED Final   Candida parapsilosis NOT DETECTED NOT DETECTED Final   Candida tropicalis NOT DETECTED NOT DETECTED Final   Cryptococcus neoformans/gattii NOT DETECTED NOT DETECTED Final   CTX-M ESBL NOT DETECTED NOT DETECTED Final   Carbapenem resistance IMP NOT DETECTED NOT DETECTED Final   Carbapenem resistance KPC NOT DETECTED NOT DETECTED Final   Carbapenem resistance NDM NOT DETECTED NOT DETECTED Final   Carbapenem resist OXA 48 LIKE NOT DETECTED NOT DETECTED Final   Carbapenem resistance VIM NOT DETECTED NOT DETECTED Final    Comment: Performed at [facility], [place] [address], [place], [place] [address]  Susceptibility, Aer + Anaerob     Status: Abnormal   Collection Time: 05/26/21  5:14 AM  Result Value Ref Range Status   Suscept, Aer + Anaerob Final report (A)  Corrected    Comment: (NOTE) Performed At: [facility] [address] [place], [place] [id number] Rush Farmer MD [id number] CORRECTED ON 01/26 AT 1036: PREVIOUSLY REPORTED AS Preliminary report    Source of Sample BLOOD/ ACINETOBACTER LWOFFII  Final    Comment: Performed at [facility], [place] [address] [place],  [address]  Susceptibility Result     Status: Abnormal   Collection Time: 05/26/21  5:14 AM  Result Value Ref Range Status   Suscept Result 1 Acinetobacter lwoffii (A)  Final    Comment: (NOTE) Identification performed by account, not confirmed by this laboratory.    Antimicrobial Suscept Comment  Corrected    Comment: (NOTE)      ** S = Susceptible; I = Intermediate; R = Resistant **                   P = Positive; N = Negative            MICS are expressed in micrograms per mL   Antibiotic                 RSLT#1    RSLT#2    RSLT#3    RSLT#4 Amikacin  S Ampicillin/Sulbactam           S Cefepime                       R Cefotaxime                      I Ceftazidime                    R Ceftriaxone                    I Ciprofloxacin                  S Gentamicin                     S Levofloxacin                   S Meropenem                      S Piperacillin/Tazobactam        R Ticarcillin/Clavulanate        S Tobramycin                     S Trimethoprim/Sulfa             S Performed At: [facility] [employer] [place], [place] [id number] Rush Farmer MD [id number]   MRSA Next Gen by PCR, Nasal     Status: None   Collection Time: 05/26/21  6:30 PM   Specimen: Nasal Mucosa; Nasal Swab  Result Value Ref Range Status   MRSA by PCR Next Gen NOT DETECTED NOT DETECTED Final    Comment: (NOTE) The GeneXpert MRSA Assay (FDA approved for NASAL specimens only), is one component of a comprehensive MRSA colonization surveillance program. It is not intended to diagnose MRSA infection nor to guide or monitor treatment for MRSA infections. Test performance is not FDA approved in patients less than 66 years old. Performed at [facility], [place] [address], [place], [place] [address]   Urine Culture     Status: None   Collection Time: 05/27/21  6:10 AM   Specimen: Urine, Clean Catch  Result Value Ref Range Status   Specimen Description URINE, CLEAN CATCH  Final   Special Requests NONE  Final   Culture   Final    NO GROWTH Performed at [facility], [place] [address] [place], [place] [address]    Report Status 05/28/2021 FINAL  Final  Culture, blood (Routine X 2) w Reflex to ID Panel     Status: None   Collection Time: 05/28/21  2:56 PM   Specimen: BLOOD  Result Value Ref Range Status   Specimen Description BLOOD BLOOD LEFT FOREARM  Final   Special Requests   Final    BOTTLES DRAWN AEROBIC AND ANAEROBIC Blood Culture adequate volume   Culture   Final    NO GROWTH 5 DAYS Performed at [facility], [place] [address] [place], [place] [address]    Report Status 06/02/2021 FINAL  Final   Culture, blood (Routine X 2) w Reflex to ID Panel     Status: None   Collection Time: 05/28/21  2:57 PM   Specimen: BLOOD  Result Value Ref Range Status   Specimen Description BLOOD RIGHT ANTECUBITAL  Final   Special Requests   Final  BOTTLES DRAWN AEROBIC AND ANAEROBIC Blood Culture adequate volume   Culture   Final    NO GROWTH 5 DAYS Performed at [facility], [place] [address], [place], [place] [address]    Report Status 06/02/2021 FINAL  Final  Respiratory (~20 pathogens) panel by PCR     Status: Abnormal   Collection Time: 05/28/21  7:04 PM   Specimen: Nasopharyngeal Swab; Respiratory  Result Value Ref Range Status   Adenovirus NOT DETECTED NOT DETECTED Final   Coronavirus 229E NOT DETECTED NOT DETECTED Final    Comment: (NOTE) The Coronavirus on the Respiratory Panel, DOES NOT test for the novel  Coronavirus (2019 nCoV)    Coronavirus HKU1 NOT DETECTED NOT DETECTED Final   Coronavirus NL63 NOT DETECTED NOT DETECTED Final   Coronavirus OC43 NOT DETECTED NOT DETECTED Final   Metapneumovirus NOT DETECTED NOT DETECTED Final   Rhinovirus / Enterovirus DETECTED (A) NOT DETECTED Final   Influenza A NOT DETECTED NOT DETECTED Final   Influenza B NOT DETECTED NOT DETECTED Final   Parainfluenza Virus 1 NOT DETECTED NOT DETECTED Final   Parainfluenza Virus 2 NOT DETECTED NOT DETECTED Final   Parainfluenza Virus 3 NOT DETECTED NOT DETECTED Final   Parainfluenza Virus 4 NOT DETECTED NOT DETECTED Final   Respiratory Syncytial Virus NOT DETECTED NOT DETECTED Final   Bordetella pertussis NOT DETECTED NOT DETECTED Final   Bordetella Parapertussis NOT DETECTED NOT DETECTED Final   Chlamydophila pneumoniae NOT DETECTED NOT DETECTED Final   Mycoplasma pneumoniae NOT DETECTED NOT DETECTED Final    Comment: Performed at [facility], [place] [address] [place], [place] [address]    Labs: CBC: Recent Labs  Lab 02/27/22 1842 03/01/22 0500  WBC 4.0 3.7*  HGB 13.9 14.2  HCT  40.3 40.2  MCV 103.1* 101.8*  PLT 113* 123XX123*   Basic Metabolic Panel: Recent Labs  Lab 02/27/22 1842 03/01/22 0500 03/02/22 0832 03/03/22 0916 03/04/22 0049  NA 139 137 134* 135 135  K 4.1 3.4* 4.3 4.3 4.0  CL 110 101 105 104 101  CO2 23 24 23 25 26   GLUCOSE 85 107* 101* 101* 100*  BUN 26* 18 21* 18 24*  CREATININE 1.30* 1.24 1.24 1.10 1.18  CALCIUM 8.8* 8.4* 8.6* 8.8* 8.9  MG  --   --  2.2  --   --    Liver Function Tests: No results for input(s): "AST", "ALT", "ALKPHOS", "BILITOT", "PROT", "ALBUMIN" in the last 168 hours. CBG: No results for input(s): "GLUCAP" in the last 168 hours.  Discharge time spent: greater than 30 minutes.  Signed: Tawni Millers, MD [facility] 03/04/2022

## 2022-03-04 NOTE — Care Management Important Message (Signed)
Important Message  Patient Details  Name: Charles [name] MRN: [id number] Date of Birth: 01-23-1971   Medicare Important Message Given:  Yes     Shelda Altes 03/04/2022, 10:43 AM

## 2022-03-14 ENCOUNTER — Telehealth: Payer: Self-pay

## 2022-03-14 NOTE — Telephone Encounter (Signed)
        Patient  visited [facility] [place] on 02/24/2022  for Acute on chronic systolic CHF.   Telephone encounter attempt :  1st  Patient not at home spoke with patient's niece will call again next week.   [place] Resource Care Guide   [email or web address]  ?? [phone]   Website: [email or web address]  [email or web address]

## 2022-03-19 ENCOUNTER — Telehealth: Payer: Self-pay

## 2022-03-19 NOTE — Telephone Encounter (Signed)
        Patient  visited [facility] [place] on 02/24/2022  for Acute on chronic systolic CHF.   Telephone encounter attempt :  2nd  A HIPAA compliant voice message was left requesting a return call.  Instructed patient to call back at [phone].   [place] Resource Care Guide   [email or web address]  ?? [phone]   Website: [email or web address]  [email or web address]

## 2022-03-20 ENCOUNTER — Telehealth: Payer: Self-pay

## 2022-03-20 NOTE — Telephone Encounter (Signed)
        Patient  visited [facility] [place] on 02/24/2022  for Acute on chronic systolic CHF.   Telephone encounter attempt :  3rd  A HIPAA compliant voice message was left requesting a return call.  Instructed patient to call back at [phone].   Charles [name] [facility]  [facility] Resource Care Guide   [email or web address]  ?? [phone]   Website: [email or web address]  [email or web address]

## 2022-03-30 ENCOUNTER — Encounter: Payer: Self-pay | Admitting: *Deleted

## 2022-04-15 ENCOUNTER — Encounter: Payer: Self-pay | Admitting: *Deleted

## 2022-04-15 NOTE — Progress Notes (Signed)
Ist call - able to contact pt's niece who states pt is aware of HTN issues and has medications, although she cannot confirm he takes his meds- "there are other things going on in his life." - Niece states her sister is a Engineer, civil (consulting) and she will have sister call this RN back to f/u most recent 196/110 at 03/30/22 event.

## 2022-05-31 ENCOUNTER — Emergency Department (HOSPITAL_COMMUNITY): Payer: Medicare HMO

## 2022-05-31 ENCOUNTER — Other Ambulatory Visit: Payer: Self-pay

## 2022-05-31 ENCOUNTER — Encounter (HOSPITAL_COMMUNITY): Payer: Self-pay

## 2022-05-31 ENCOUNTER — Emergency Department (HOSPITAL_COMMUNITY)
Admission: EM | Admit: 2022-05-31 | Discharge: 2022-06-01 | Disposition: A | Discharge status: Home / Self Care | Payer: Medicare HMO | Attending: Emergency Medicine | Admitting: Emergency Medicine

## 2022-05-31 DIAGNOSIS — M7989 Other specified soft tissue disorders: Secondary | ICD-10-CM | POA: Insufficient documentation

## 2022-05-31 DIAGNOSIS — M79642 Pain in left hand: Secondary | ICD-10-CM | POA: Diagnosis not present

## 2022-05-31 DIAGNOSIS — F17210 Nicotine dependence, cigarettes, uncomplicated: Secondary | ICD-10-CM | POA: Diagnosis not present

## 2022-05-31 DIAGNOSIS — R7989 Other specified abnormal findings of blood chemistry: Secondary | ICD-10-CM | POA: Diagnosis not present

## 2022-05-31 DIAGNOSIS — M79641 Pain in right hand: Secondary | ICD-10-CM | POA: Diagnosis not present

## 2022-05-31 DIAGNOSIS — I509 Heart failure, unspecified: Secondary | ICD-10-CM | POA: Diagnosis not present

## 2022-05-31 DIAGNOSIS — I10 Essential (primary) hypertension: Secondary | ICD-10-CM | POA: Insufficient documentation

## 2022-05-31 DIAGNOSIS — M791 Myalgia, unspecified site: Secondary | ICD-10-CM | POA: Diagnosis not present

## 2022-05-31 DIAGNOSIS — R52 Pain, unspecified: Secondary | ICD-10-CM

## 2022-05-31 DIAGNOSIS — R079 Chest pain, unspecified: Secondary | ICD-10-CM | POA: Diagnosis not present

## 2022-05-31 DIAGNOSIS — R0789 Other chest pain: Secondary | ICD-10-CM | POA: Diagnosis not present

## 2022-05-31 LAB — CBC
HCT: 38.8 % — ABNORMAL LOW (ref 39.0–52.0)
Hemoglobin: 12.7 g/dL — ABNORMAL LOW (ref 13.0–17.0)
MCH: 34.7 pg — ABNORMAL HIGH (ref 26.0–34.0)
MCHC: 32.7 g/dL (ref 30.0–36.0)
MCV: 106 fL — ABNORMAL HIGH (ref 80.0–100.0)
Platelets: 119 10*3/uL — ABNORMAL LOW (ref 150–400)
RBC: 3.66 MIL/uL — ABNORMAL LOW (ref 4.22–5.81)
RDW: 17.3 % — ABNORMAL HIGH (ref 11.5–15.5)
WBC: 4.2 10*3/uL (ref 4.0–10.5)
nRBC: 0 % (ref 0.0–0.2)

## 2022-05-31 LAB — BASIC METABOLIC PANEL
Anion gap: 7 (ref 5–15)
BUN: 19 mg/dL (ref 6–20)
CO2: 25 mmol/L (ref 22–32)
Calcium: 9 mg/dL (ref 8.9–10.3)
Chloride: 105 mmol/L (ref 98–111)
Creatinine, Ser: 1.16 mg/dL (ref 0.61–1.24)
GFR, Estimated: >60 mL/min (ref >60)
Glucose, Bld: 109 mg/dL — ABNORMAL HIGH (ref 70–99)
Potassium: 3.8 mmol/L (ref 3.5–5.1)
Sodium: 137 mmol/L (ref 135–145)

## 2022-05-31 LAB — BRAIN NATRIURETIC PEPTIDE: B Natriuretic Peptide: 1094.4 pg/mL — ABNORMAL HIGH (ref 0.0–100.0)

## 2022-05-31 LAB — TROPONIN I (HIGH SENSITIVITY)
Troponin I (High Sensitivity): 88 ng/L — ABNORMAL HIGH (ref <18)
Troponin I (High Sensitivity): 87 ng/L — ABNORMAL HIGH (ref <18)

## 2022-05-31 NOTE — ED Triage Notes (Addendum)
Pt presents with chest pain that is worse with activity, ShOB, feet and hand swelling. Pt with hx of HTN, CAD. Pt states he has been out of his medication for 2 months. Pt admits to using cocaine 2 days ago for "pain relief."

## 2022-05-31 NOTE — ED Provider Triage Note (Signed)
Emergency Medicine Provider Triage Evaluation Note  Charles [name] , a 52 y.o. male  was evaluated in triage.  Pt complains of CP, SOB, hand swelling, foot swelling, chills, nonproductive cough x 1 week.  Review of Systems  Positive: As above Negative:   Physical Exam  BP (!) 161/102 (BP Location: Right Arm)   Pulse 99   Temp 98.8 F (37.1 C) (Oral)   Resp (!) 24   SpO2 96%  Gen:   Awake, no distress   Resp:  Normal effort  MSK:   Moves extremities without difficulty  Other:   Medical Decision Making  Medically screening exam initiated at 6:08 PM.  Appropriate orders placed.  [place] Dani was informed that the remainder of the evaluation will be completed by another provider, this initial triage assessment does not replace that evaluation, and the importance of remaining in the ED until their evaluation is complete.  Workup initiated   Kateri Plummer, PA-C 05/31/22 1808

## 2022-06-01 MED ORDER — FUROSEMIDE 20 MG PO TABS: 20.0000 mg | ORAL_TABLET | Freq: Every day | ORAL | 0 refills | Status: DC

## 2022-06-01 MED ORDER — SPIRONOLACTONE 25 MG PO TABS: 12.5000 mg | ORAL_TABLET | Freq: Every day | ORAL | 0 refills | Status: DC

## 2022-06-01 MED ORDER — POTASSIUM CHLORIDE CRYS ER 10 MEQ PO TBCR: 10.0000 meq | EXTENDED_RELEASE_TABLET | Freq: Every day | ORAL | 0 refills | Status: DC

## 2022-06-01 MED ORDER — LOSARTAN POTASSIUM 25 MG PO TABS: 25.0000 mg | ORAL_TABLET | Freq: Every day | ORAL | 0 refills | Status: DC

## 2022-06-01 MED ORDER — ACETAMINOPHEN 500 MG PO TABS
1000.0000 mg | ORAL_TABLET | Freq: Once | ORAL | Status: AC
  Administered 2022-06-01: 1000 mg via ORAL
  Filled 2022-06-01: qty 2

## 2022-06-01 NOTE — ED Notes (Signed)
Pt discharged to lobby, pt waiting for bus
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
## 2022-06-01 NOTE — Discharge Instructions (Addendum)
You were evaluated in the Emergency Department and after careful evaluation, we did not find any emergent condition requiring admission or further testing in the hospital.  Your testing is showing some strain on your heart.  This is likely because you have not been taking your medicines.  The cocaine use is also contributing.  Very important that you avoid cocaine and restart all of your medicines.  Please return to the Emergency Department if you experience any worsening of your condition.  Thank you for allowing [place] to be a part of your care.

## 2022-06-05 ENCOUNTER — Telehealth: Payer: Self-pay

## 2022-06-05 NOTE — Telephone Encounter (Signed)
        Patient  visited [place] on 1/20     Telephone encounter attempt :  1st  Patient declined call    [address], [facility]  [phone] [address], [place], [place] [address] Phone: [phone] Email: [email or web address]

## 2022-06-12 ENCOUNTER — Encounter: Payer: Self-pay | Admitting: *Deleted

## 2022-06-12 NOTE — Progress Notes (Signed)
Unable to contact pt by phone and no documentation in [facility] since first event follow up indicating pt has seen PCP. Additional ED visit has occurred since initial event follow-up attempts as well as [facility] documentation of [facility] ED f/u attempt. Second letter sent offering PCP options and contact info, as well as [facility] CM support contact info.

## 2022-07-04 ENCOUNTER — Emergency Department (HOSPITAL_COMMUNITY)
Admission: EM | Admit: 2022-07-04 | Discharge: 2022-07-04 | Disposition: A | Discharge status: Home / Self Care | Payer: Medicare HMO | Attending: Student | Admitting: Student

## 2022-07-04 ENCOUNTER — Encounter (HOSPITAL_COMMUNITY): Payer: Self-pay

## 2022-07-04 ENCOUNTER — Other Ambulatory Visit: Payer: Self-pay

## 2022-07-04 ENCOUNTER — Emergency Department (HOSPITAL_COMMUNITY): Payer: Medicare HMO

## 2022-07-04 ENCOUNTER — Emergency Department (HOSPITAL_COMMUNITY)
Admission: EM | Admit: 2022-07-04 | Discharge: 2022-07-05 | Disposition: A | Discharge status: Home / Self Care | Payer: Medicare HMO | Source: Home / Self Care | Attending: Emergency Medicine | Admitting: Emergency Medicine

## 2022-07-04 DIAGNOSIS — J3489 Other specified disorders of nose and nasal sinuses: Secondary | ICD-10-CM | POA: Diagnosis not present

## 2022-07-04 DIAGNOSIS — I10 Essential (primary) hypertension: Secondary | ICD-10-CM | POA: Insufficient documentation

## 2022-07-04 DIAGNOSIS — R079 Chest pain, unspecified: Secondary | ICD-10-CM | POA: Insufficient documentation

## 2022-07-04 DIAGNOSIS — Z8616 Personal history of COVID-19: Secondary | ICD-10-CM | POA: Insufficient documentation

## 2022-07-04 DIAGNOSIS — J441 Chronic obstructive pulmonary disease with (acute) exacerbation: Secondary | ICD-10-CM | POA: Diagnosis not present

## 2022-07-04 DIAGNOSIS — M7989 Other specified soft tissue disorders: Secondary | ICD-10-CM | POA: Diagnosis not present

## 2022-07-04 DIAGNOSIS — J168 Pneumonia due to other specified infectious organisms: Secondary | ICD-10-CM | POA: Diagnosis not present

## 2022-07-04 DIAGNOSIS — R600 Localized edema: Secondary | ICD-10-CM | POA: Insufficient documentation

## 2022-07-04 DIAGNOSIS — I130 Hypertensive heart and chronic kidney disease with heart failure and stage 1 through stage 4 chronic kidney disease, or unspecified chronic kidney disease: Secondary | ICD-10-CM | POA: Insufficient documentation

## 2022-07-04 DIAGNOSIS — Z5900 Homelessness unspecified: Secondary | ICD-10-CM | POA: Insufficient documentation

## 2022-07-04 DIAGNOSIS — Z79899 Other long term (current) drug therapy: Secondary | ICD-10-CM | POA: Insufficient documentation

## 2022-07-04 DIAGNOSIS — R059 Cough, unspecified: Secondary | ICD-10-CM | POA: Insufficient documentation

## 2022-07-04 DIAGNOSIS — Z20822 Contact with and (suspected) exposure to covid-19: Secondary | ICD-10-CM | POA: Diagnosis not present

## 2022-07-04 DIAGNOSIS — M791 Myalgia, unspecified site: Secondary | ICD-10-CM | POA: Diagnosis not present

## 2022-07-04 DIAGNOSIS — J189 Pneumonia, unspecified organism: Secondary | ICD-10-CM

## 2022-07-04 DIAGNOSIS — J449 Chronic obstructive pulmonary disease, unspecified: Secondary | ICD-10-CM | POA: Insufficient documentation

## 2022-07-04 DIAGNOSIS — N182 Chronic kidney disease, stage 2 (mild): Secondary | ICD-10-CM | POA: Diagnosis not present

## 2022-07-04 DIAGNOSIS — F17210 Nicotine dependence, cigarettes, uncomplicated: Secondary | ICD-10-CM | POA: Diagnosis not present

## 2022-07-04 DIAGNOSIS — R0602 Shortness of breath: Secondary | ICD-10-CM | POA: Diagnosis not present

## 2022-07-04 DIAGNOSIS — I5043 Acute on chronic combined systolic (congestive) and diastolic (congestive) heart failure: Secondary | ICD-10-CM | POA: Insufficient documentation

## 2022-07-04 DIAGNOSIS — R7989 Other specified abnormal findings of blood chemistry: Secondary | ICD-10-CM | POA: Diagnosis not present

## 2022-07-04 LAB — CBC WITH DIFFERENTIAL/PLATELET
Abs Immature Granulocytes: 0.01 10*3/uL (ref 0.00–0.07)
Basophils Absolute: 0 10*3/uL (ref 0.0–0.1)
Basophils Relative: 0 %
Eosinophils Absolute: 0 10*3/uL (ref 0.0–0.5)
Eosinophils Relative: 1 %
HCT: 35.8 % — ABNORMAL LOW (ref 39.0–52.0)
Hemoglobin: 11.9 g/dL — ABNORMAL LOW (ref 13.0–17.0)
Immature Granulocytes: 0 %
Lymphocytes Relative: 27 %
Lymphs Abs: 0.8 10*3/uL (ref 0.7–4.0)
MCH: 35.2 pg — ABNORMAL HIGH (ref 26.0–34.0)
MCHC: 33.2 g/dL (ref 30.0–36.0)
MCV: 105.9 fL — ABNORMAL HIGH (ref 80.0–100.0)
Monocytes Absolute: 0.4 10*3/uL (ref 0.1–1.0)
Monocytes Relative: 14 %
Neutro Abs: 1.8 10*3/uL (ref 1.7–7.7)
Neutrophils Relative %: 58 %
Platelets: 110 10*3/uL — ABNORMAL LOW (ref 150–400)
RBC: 3.38 MIL/uL — ABNORMAL LOW (ref 4.22–5.81)
RDW: 18 % — ABNORMAL HIGH (ref 11.5–15.5)
WBC: 3.1 10*3/uL — ABNORMAL LOW (ref 4.0–10.5)
nRBC: 0 % (ref 0.0–0.2)

## 2022-07-04 LAB — BASIC METABOLIC PANEL
Anion gap: 7 (ref 5–15)
BUN: 15 mg/dL (ref 6–20)
CO2: 26 mmol/L (ref 22–32)
Calcium: 8.7 mg/dL — ABNORMAL LOW (ref 8.9–10.3)
Chloride: 108 mmol/L (ref 98–111)
Creatinine, Ser: 1.4 mg/dL — ABNORMAL HIGH (ref 0.61–1.24)
GFR, Estimated: >60 mL/min (ref >60)
Glucose, Bld: 116 mg/dL — ABNORMAL HIGH (ref 70–99)
Potassium: 4.3 mmol/L (ref 3.5–5.1)
Sodium: 141 mmol/L (ref 135–145)

## 2022-07-04 LAB — RESP PANEL BY RT-PCR (RSV, FLU A&B, COVID)  RVPGX2
Influenza A by PCR: NEGATIVE
Influenza B by PCR: NEGATIVE
Resp Syncytial Virus by PCR: NEGATIVE
SARS Coronavirus 2 by RT PCR: NEGATIVE

## 2022-07-04 LAB — TROPONIN I (HIGH SENSITIVITY)
Troponin I (High Sensitivity): 69 ng/L — ABNORMAL HIGH (ref <18)
Troponin I (High Sensitivity): 71 ng/L — ABNORMAL HIGH (ref <18)

## 2022-07-04 LAB — MAGNESIUM: Magnesium: 2 mg/dL (ref 1.7–2.4)

## 2022-07-04 LAB — ETHANOL: Alcohol, Ethyl (B): <10 mg/dL (ref <10)

## 2022-07-04 LAB — D-DIMER, QUANTITATIVE: D-Dimer, Quant: 0.6 ug/mL-FEU — ABNORMAL HIGH (ref 0.00–0.50)

## 2022-07-04 MED ORDER — ALBUTEROL SULFATE HFA 108 (90 BASE) MCG/ACT IN AERS
2.0000 | INHALATION_SPRAY | Freq: Once | RESPIRATORY_TRACT | Status: AC
  Administered 2022-07-04: 2 via RESPIRATORY_TRACT
  Filled 2022-07-04: qty 6.7

## 2022-07-04 MED ORDER — METHYLPREDNISOLONE SODIUM SUCC 125 MG IJ SOLR
125.0000 mg | Freq: Once | INTRAMUSCULAR | Status: AC
  Administered 2022-07-04: 125 mg via INTRAVENOUS
  Filled 2022-07-04: qty 2

## 2022-07-04 MED ORDER — KETOROLAC TROMETHAMINE 15 MG/ML IJ SOLN
15.0000 mg | Freq: Once | INTRAMUSCULAR | Status: AC
  Administered 2022-07-04: 15 mg via INTRAVENOUS
  Filled 2022-07-04: qty 1

## 2022-07-04 MED ORDER — IPRATROPIUM-ALBUTEROL 0.5-2.5 (3) MG/3ML IN SOLN
RESPIRATORY_TRACT | Status: AC
  Filled 2022-07-04: qty 6

## 2022-07-04 MED ORDER — PREDNISONE 10 MG PO TABS: 40.0000 mg | ORAL_TABLET | Freq: Every day | ORAL | 0 refills | Status: AC

## 2022-07-04 MED ORDER — AMOXICILLIN-POT CLAVULANATE 875-125 MG PO TABS
1.0000 | ORAL_TABLET | Freq: Once | ORAL | Status: AC
  Administered 2022-07-04: 1 via ORAL
  Filled 2022-07-04: qty 1

## 2022-07-04 MED ORDER — IOHEXOL 350 MG/ML SOLN
65.0000 mL | Freq: Once | INTRAVENOUS | Status: AC | PRN
  Administered 2022-07-04: 65 mL via INTRAVENOUS

## 2022-07-04 MED ORDER — IPRATROPIUM-ALBUTEROL 0.5-2.5 (3) MG/3ML IN SOLN
9.0000 mL | Freq: Once | RESPIRATORY_TRACT | Status: AC
  Administered 2022-07-04: 9 mL via RESPIRATORY_TRACT
  Filled 2022-07-04: qty 3

## 2022-07-04 MED ORDER — AMOXICILLIN-POT CLAVULANATE 875-125 MG PO TABS: 1.0000 | ORAL_TABLET | Freq: Two times a day (BID) | ORAL | 0 refills | Status: DC

## 2022-07-04 MED ORDER — LIDOCAINE 5 % EX PTCH
1.0000 | MEDICATED_PATCH | CUTANEOUS | Status: DC
  Administered 2022-07-04: 1 via TRANSDERMAL
  Filled 2022-07-04: qty 1

## 2022-07-04 NOTE — ED Triage Notes (Signed)
Pt was here earlier today for SOB, pt returns d/t concern for swelling to hands and feet for 1 month. Charles [name] he spoke about the swelling to the provider earlier today regarding the swelling, but st they didn't do anything. Hx of CHF & cocaine use. Pt had complete lab panel earlier.

## 2022-07-04 NOTE — ED Triage Notes (Signed)
Pt c/o SOB, bodyaches, runny nose, dry coughx1wk. Pt c/o pain in right mid back w/breathing. Pt is tachypneic.
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
## 2022-07-04 NOTE — ED Provider Triage Note (Signed)
Emergency Medicine Provider Triage Evaluation Note  Charles [name] , a 52 y.o. male  was evaluated in triage.  Pt complains of shortness of breath, chest discomfort, cough ongoing for about a week but worse today.  Review of Systems  Positive: As above Negative: As above  Physical Exam  BP (!) 162/112 (BP Location: Right Arm)   Pulse (!) 102   Temp 97.7 F (36.5 C)   Resp (!) 24   SpO2 95%  Gen:   Awake, no distress   Resp:  Normal effort  MSK:   Moves extremities without difficulty  Other:    Medical Decision Making  Medically screening exam initiated at 2:04 PM.  Appropriate orders placed.  Charles [name] was informed that the remainder of the evaluation will be completed by another provider, this initial triage assessment does not replace that evaluation, and the importance of remaining in the ED until their evaluation is complete.     Evlyn Courier, PA-C 07/04/22 [phone]

## 2022-07-05 DIAGNOSIS — R0602 Shortness of breath: Secondary | ICD-10-CM | POA: Diagnosis not present

## 2022-07-05 MED ORDER — FUROSEMIDE 20 MG PO TABS
80.0000 mg | ORAL_TABLET | Freq: Once | ORAL | Status: AC
  Administered 2022-07-05: 80 mg via ORAL
  Filled 2022-07-05: qty 4

## 2022-07-05 NOTE — Discharge Instructions (Signed)
Please avoid using cocaine it will make your heart weaker and also swelling worse and can cause a whole lot more problems.   Double your lasix dose for the next 3 days please and then resume normal dosing pending primary care follow up.

## 2022-07-05 NOTE — ED Provider Notes (Signed)
[facility] Provider Note   CSN: [id number] Arrival date & time: 07/04/22  2223     History  Chief Complaint  Patient presents with   Swelling    Charles [name] is a 52 y.o. male.  52 year old male who presents ER today secondary to finger and toe swelling.  Patient has been on for many months.  He was seen earlier today where full labs were done and reassuring.  Also had CT scan of chest and multiple other test done that were all reassuring.  Patient states that nothing else is changed.  States been taking his Lasix.  Is not short of breath this time.  Triage note notes that he uses cocaine.        Home Medications Prior to Admission medications   Medication Sig Start Date End Date Taking? Authorizing Provider  acetaminophen (TYLENOL) 325 MG tablet Take 1,300-1,950 mg by mouth daily as needed for mild pain or headache.    [provider]  amoxicillin-clavulanate (AUGMENTIN) 875-125 MG tablet Take 1 tablet by mouth every 12 (twelve) hours. 07/04/22   Kommor, [place], MD  furosemide (LASIX) 20 MG tablet Take 1 tablet (20 mg total) by mouth daily. 06/01/22 07/01/22  Maudie Flakes, MD  losartan (COZAAR) 25 MG tablet Take 1 tablet (25 mg total) by mouth daily. 06/01/22 07/01/22  Maudie Flakes, MD  potassium chloride (KLOR-CON M) 10 MEQ tablet Take 1 tablet (10 mEq total) by mouth daily. 06/01/22 07/01/22  Maudie Flakes, MD  predniSONE (DELTASONE) 10 MG tablet Take 4 tablets (40 mg total) by mouth daily for 4 days. 07/04/22 07/08/22  Kommor, Debe Coder, MD  spironolactone (ALDACTONE) 25 MG tablet Take 1/2 tablet (12.5 mg total) by mouth daily. 06/01/22 07/01/22  Maudie Flakes, MD      Allergies    Eggs or egg-derived products, Banana, and Pork-derived products    Review of Systems   Review of Systems  Physical Exam Updated Vital Signs BP (!) 190/117 (BP Location: Right Arm)   Pulse 94   Temp 98 F (36.7 C) (Oral)   Resp 20    Ht '5\' 6"'$  (1.676 m)   Wt 74.8 kg   SpO2 95%   BMI 26.62 kg/m  Physical Exam Vitals and nursing note reviewed.  Constitutional:      Appearance: He is well-developed.  HENT:     Head: Normocephalic and atraumatic.     Mouth/Throat:     Pharynx: Oropharynx is clear.  Eyes:     Pupils: Pupils are equal, round, and reactive to light.  Cardiovascular:     Rate and Rhythm: Normal rate.  Pulmonary:     Effort: Pulmonary effort is normal. No respiratory distress.  Abdominal:     General: Abdomen is flat. There is no distension.  Musculoskeletal:        General: Normal range of motion.     Cervical back: Normal range of motion.     Comments: Possibly minimal edema to both feet and hands.  Lungs clear.  Skin:    General: Skin is warm and dry.  Neurological:     General: No focal deficit present.     Mental Status: He is alert.     ED Results / Procedures / Treatments   Labs (all labs ordered are listed, but only abnormal results are displayed) Labs Reviewed - No data to display  EKG None  Radiology CT Angio Chest PE W and/or  Wo Contrast  Result Date: 07/04/2022 CLINICAL DATA:  Positive D-dimer, short of breath, cough, chest discomfort EXAM: CT ANGIOGRAPHY CHEST WITH CONTRAST TECHNIQUE: Multidetector CT imaging of the chest was performed using the standard protocol during bolus administration of intravenous contrast. Multiplanar CT image reconstructions and MIPs were obtained to evaluate the vascular anatomy. RADIATION DOSE REDUCTION: This exam was performed according to the departmental dose-optimization program which includes automated exposure control, adjustment of the mA and/or kV according to patient size and/or use of iterative reconstruction technique. CONTRAST:  32m OMNIPAQUE IOHEXOL 350 MG/ML SOLN COMPARISON:  06/22/2019, 07/04/2022 FINDINGS: Cardiovascular: This is a technically adequate evaluation of the pulmonary vasculature. No filling defects or pulmonary emboli. The  heart is enlarged without pericardial effusion. Atherosclerosis throughout the coronary vasculature greatest in the LAD and circumflex distributions. No evidence of thoracic aortic aneurysm or dissection. Mediastinum/Nodes: No enlarged mediastinal, hilar, or axillary lymph nodes. Thyroid gland, trachea, and esophagus demonstrate no significant findings. Lungs/Pleura: Upper lobe predominant centrilobular emphysema. There are scattered tree in bud nodular opacities within the right middle lobe, which may reflect inflammation, infection including indolent etiologies such as MAC, or aspiration. Scattered bibasilar hypoventilatory changes. No effusion or pneumothorax. The central airways are patent. Upper Abdomen: No acute abnormality.  Marked gastric distension. Musculoskeletal: No acute or destructive bony lesions. Reconstructed images demonstrate no additional findings. Review of the MIP images confirms the above findings. IMPRESSION: 1. No evidence of pulmonary embolus. 2. Marked cardiomegaly without pericardial effusion. Extensive coronary artery atherosclerosis. 3. Emphysema, with patchy right middle lobe airspace disease consistent with inflammation, aspiration, or infection. Electronically Signed   By: MRanda NgoM.D.   On: 07/04/2022 19:43   DG Chest 2 View  Result Date: 07/04/2022 CLINICAL DATA:  Sob  cough  body  aches  1  week EXAM: CHEST - 2 VIEW COMPARISON:  05/31/2022 FINDINGS: Lungs are clear. Heart size upper limits normal, stable. No effusion. Embolization coils left upper abdomen. Heterotopic calcification inferior to the lateral right scapula. IMPRESSION: No acute cardiopulmonary disease. Electronically Signed   By: DLucrezia EuropeM.D.   On: 07/04/2022 14:50    Procedures Procedures    Medications Ordered in ED Medications  furosemide (LASIX) tablet 80 mg (80 mg Oral Given 07/05/22 0046)    ED Course/ Medical Decision Making/ A&P                             Medical Decision  Making Risk Prescription drug management.   Will increase his Lasix for few days and follow-up with PCP for recheck of blood pressure.  Also advised abstinence from sympathomimetic usage.   Final Clinical Impression(s) / ED Diagnoses Final diagnoses:  Finger swelling  Uncontrolled hypertension    Rx / DC Orders ED Discharge Orders     None         Alaisa Moffitt, JCorene Cornea MD 07/05/22 [phone]

## 2022-07-11 ENCOUNTER — Emergency Department (EMERGENCY_DEPARTMENT_HOSPITAL): Payer: Medicare HMO

## 2022-07-11 ENCOUNTER — Emergency Department (HOSPITAL_COMMUNITY)
Admission: EM | Admit: 2022-07-11 | Discharge: 2022-07-11 | Disposition: A | Discharge status: Home / Self Care | Payer: Medicare HMO | Attending: Emergency Medicine | Admitting: Emergency Medicine

## 2022-07-11 ENCOUNTER — Other Ambulatory Visit: Payer: Self-pay

## 2022-07-11 ENCOUNTER — Emergency Department (HOSPITAL_COMMUNITY): Payer: Medicare HMO

## 2022-07-11 DIAGNOSIS — R0602 Shortness of breath: Secondary | ICD-10-CM | POA: Diagnosis not present

## 2022-07-11 DIAGNOSIS — R0600 Dyspnea, unspecified: Secondary | ICD-10-CM

## 2022-07-11 DIAGNOSIS — I509 Heart failure, unspecified: Secondary | ICD-10-CM | POA: Diagnosis not present

## 2022-07-11 DIAGNOSIS — R0609 Other forms of dyspnea: Secondary | ICD-10-CM

## 2022-07-11 DIAGNOSIS — J441 Chronic obstructive pulmonary disease with (acute) exacerbation: Secondary | ICD-10-CM | POA: Diagnosis not present

## 2022-07-11 LAB — BASIC METABOLIC PANEL
Anion gap: 11 (ref 5–15)
BUN: 17 mg/dL (ref 6–20)
CO2: 24 mmol/L (ref 22–32)
Calcium: 8.9 mg/dL (ref 8.9–10.3)
Chloride: 99 mmol/L (ref 98–111)
Creatinine, Ser: 1.2 mg/dL (ref 0.61–1.24)
GFR, Estimated: >60 mL/min (ref >60)
Glucose, Bld: 214 mg/dL — ABNORMAL HIGH (ref 70–99)
Potassium: 4.4 mmol/L (ref 3.5–5.1)
Sodium: 134 mmol/L — ABNORMAL LOW (ref 135–145)

## 2022-07-11 LAB — CBG MONITORING, ED: Glucose-Capillary: 297 mg/dL — ABNORMAL HIGH (ref 70–99)

## 2022-07-11 LAB — ECHOCARDIOGRAM COMPLETE
Area-P 1/2: 3.56 cm2
Calc EF: 30.9 %
Height: 66 in
S' Lateral: 4.4 cm
Single Plane A2C EF: 33.8 %
Single Plane A4C EF: 28.8 %
Weight: 2640 oz

## 2022-07-11 LAB — TROPONIN I (HIGH SENSITIVITY)
Troponin I (High Sensitivity): 58 ng/L — ABNORMAL HIGH (ref <18)
Troponin I (High Sensitivity): 56 ng/L — ABNORMAL HIGH (ref <18)

## 2022-07-11 LAB — CBC
HCT: 45.5 % (ref 39.0–52.0)
Hemoglobin: 15.7 g/dL (ref 13.0–17.0)
MCH: 35.7 pg — ABNORMAL HIGH (ref 26.0–34.0)
MCHC: 34.5 g/dL (ref 30.0–36.0)
MCV: 103.4 fL — ABNORMAL HIGH (ref 80.0–100.0)
Platelets: 126 10*3/uL — ABNORMAL LOW (ref 150–400)
RBC: 4.4 MIL/uL (ref 4.22–5.81)
RDW: 17.9 % — ABNORMAL HIGH (ref 11.5–15.5)
WBC: 4.2 10*3/uL (ref 4.0–10.5)
nRBC: 0 % (ref 0.0–0.2)

## 2022-07-11 MED ORDER — IPRATROPIUM-ALBUTEROL 0.5-2.5 (3) MG/3ML IN SOLN
3.0000 mL | Freq: Once | RESPIRATORY_TRACT | Status: AC
  Administered 2022-07-11: 3 mL via RESPIRATORY_TRACT
  Filled 2022-07-11: qty 3

## 2022-07-11 MED ORDER — ALBUTEROL SULFATE HFA 108 (90 BASE) MCG/ACT IN AERS
2.0000 | INHALATION_SPRAY | Freq: Four times a day (QID) | RESPIRATORY_TRACT | Status: DC
  Administered 2022-07-11: 2 via RESPIRATORY_TRACT
  Filled 2022-07-11: qty 6.7

## 2022-07-11 MED ORDER — DOXYCYCLINE HYCLATE 100 MG PO CAPS: 100.0000 mg | ORAL_CAPSULE | Freq: Two times a day (BID) | ORAL | 0 refills | Status: AC

## 2022-07-11 MED ORDER — ALBUTEROL SULFATE HFA 108 (90 BASE) MCG/ACT IN AERS: 2.0000 | INHALATION_SPRAY | RESPIRATORY_TRACT | 0 refills | Status: DC | PRN

## 2022-07-11 NOTE — ED Provider Notes (Addendum)
[place] Provider Note   CSN: [id number] Arrival date & time: 07/11/22  0900     History  Chief Complaint  Patient presents with  . Shortness of Breath    Charles [name] is a 53 y.o. male.  Pt p/f dyspnea since last week and notes that his legs are numb from the cold. Pt has PMHx of housing insecurity, aplastic anemia, polysubstance abuse, sleep apnea, CHF, COPD. Patient reports that his has a worsening dyspnea since last week, worsened since last visit to the ED. He reports that he is also cold, unsure about fevers. Denies nausea, abdominal pain, vomiting, diarrhea, constipation. He reports that he is having difficulty riding his bike secondary to weakness, has to now walk his bike around. Reports that he has been sleeping excessively lately. Taking Augmentin and Prednisone regularly since dispo from ED one week ago.  Last used crack cocaine 2-3 days ago.   The history is provided by the patient.  Shortness of Breath Associated symptoms: cough   Associated symptoms: no abdominal pain, no chest pain, no ear pain, no rash and no wheezing        Home Medications Prior to Admission medications   Medication Sig Start Date End Date Taking? Authorizing Provider  albuterol (VENTOLIN HFA) 108 (90 Base) MCG/ACT inhaler Inhale 2 puffs into the lungs every 4 (four) hours as needed for wheezing or shortness of breath. 07/11/22  Yes Erskine Emery, MD  doxycycline (VIBRAMYCIN) 100 MG capsule Take 1 capsule (100 mg total) by mouth 2 (two) times daily for 7 days. 07/11/22 07/18/22 Yes Erskine Emery, MD  acetaminophen (TYLENOL) 325 MG tablet Take 1,300-1,950 mg by mouth daily as needed for mild pain or headache.    [provider]  amoxicillin-clavulanate (AUGMENTIN) 875-125 MG tablet Take 1 tablet by mouth every 12 (twelve) hours. 07/04/22   Kommor, [place], MD  furosemide (LASIX) 20 MG tablet Take 1 tablet (20 mg total) by mouth daily.  06/01/22 07/01/22  Maudie Flakes, MD  losartan (COZAAR) 25 MG tablet Take 1 tablet (25 mg total) by mouth daily. 06/01/22 07/01/22  Maudie Flakes, MD  potassium chloride (KLOR-CON M) 10 MEQ tablet Take 1 tablet (10 mEq total) by mouth daily. 06/01/22 07/01/22  Maudie Flakes, MD  spironolactone (ALDACTONE) 25 MG tablet Take 1/2 tablet (12.5 mg total) by mouth daily. 06/01/22 07/01/22  Maudie Flakes, MD      Allergies    Eggs or egg-derived products, Banana, and Pork-derived products    Review of Systems   Review of Systems  Constitutional:  Positive for activity change and fatigue.  HENT:  Negative for congestion and ear pain.   Respiratory:  Positive for cough and shortness of breath. Negative for wheezing.   Cardiovascular:  Negative for chest pain, palpitations and leg swelling.  Gastrointestinal:  Negative for abdominal pain, constipation, diarrhea and nausea.  Genitourinary:  Negative for difficulty urinating, dysuria and flank pain.  Skin:  Negative for rash.  Neurological:  Positive for dizziness.    Physical Exam Updated Vital Signs BP (!) 177/113   Pulse 91   Temp 98 F (36.7 C)   Resp 15   Ht '5\' 6"'$  (1.676 m)   Wt 74.8 kg   SpO2 98%   BMI 26.63 kg/m  Physical Exam Vitals and nursing note reviewed.  Constitutional:      General: He is not in acute distress.    Appearance: He is  normal weight.     Comments: Disheveled   HENT:     Head: Normocephalic and atraumatic.  Eyes:     Conjunctiva/sclera: Conjunctivae normal.  Neck:     Vascular: No JVD.  Cardiovascular:     Rate and Rhythm: Normal rate and regular rhythm.     Heart sounds: No murmur heard. Pulmonary:     Effort: Pulmonary effort is normal. Tachypnea present. No respiratory distress.     Breath sounds: Normal breath sounds. No decreased breath sounds.  Chest:     Chest wall: No mass, deformity or crepitus.  Abdominal:     Palpations: Abdomen is soft.     Tenderness: There is no abdominal tenderness.   Musculoskeletal:        General: No swelling.     Cervical back: Normal range of motion and neck supple.     Right lower leg: No tenderness. No edema.     Left lower leg: No tenderness. No edema.     Comments: Palpable pulses DP   Lymphadenopathy:     Cervical: No cervical adenopathy.  Skin:    General: Skin is warm and dry.     Capillary Refill: Capillary refill takes less than 2 seconds.  Neurological:     Mental Status: He is alert.  Psychiatric:        Mood and Affect: Mood normal.    ED Results / Procedures / Treatments   Labs (all labs ordered are listed, but only abnormal results are displayed) Labs Reviewed  BASIC METABOLIC PANEL - Abnormal; Notable for the following components:      Result Value   Sodium 134 (*)    Glucose, Bld 214 (*)    All other components within normal limits  CBC - Abnormal; Notable for the following components:   MCV 103.4 (*)    MCH 35.7 (*)    RDW 17.9 (*)    Platelets 126 (*)    All other components within normal limits  CBG MONITORING, ED - Abnormal; Notable for the following components:   Glucose-Capillary 297 (*)    All other components within normal limits  TROPONIN I (HIGH SENSITIVITY) - Abnormal; Notable for the following components:   Troponin I (High Sensitivity) 58 (*)    All other components within normal limits  TROPONIN I (HIGH SENSITIVITY) - Abnormal; Notable for the following components:   Troponin I (High Sensitivity) 56 (*)    All other components within normal limits  CBG MONITORING, ED    EKG EKG Interpretation  Date/Time:  Thursday July 11 2022 09:08:54 EST Ventricular Rate:  89 PR Interval:  149 QRS Duration: 69 QT Interval:  406 QTC Calculation: 494 R Axis:   30 Text Interpretation: Sinus rhythm LAE, consider biatrial enlargement LVH with secondary repolarization abnormality Anterior Q waves, possibly due to LVH similar to prior Abnormal ECG Confirmed by Carmin Muskrat [phone]) on 07/11/2022 10:43:54  AM  Radiology ECHOCARDIOGRAM COMPLETE  Result Date: 07/11/2022    ECHOCARDIOGRAM REPORT   Patient Name:   Charles [name] Date of Exam: 07/11/2022 Medical Rec #:  [id number]       Height:       66.0 in Accession #:    [id number]      Weight:       165.0 lb Date of Birth:  July 15, 1970       BSA:          1.843 m Patient Age:    33 years  BP:           177/113 mmHg Patient Gender: M               HR:           83 bpm. Exam Location:  Inpatient Procedure: 2D Echo, Color Doppler and Cardiac Doppler Indications:    R06.9 DOE  History:        Patient has prior history of Echocardiogram examinations, most                 recent 03/01/2022. CHF, COPD; Risk Factors:Polysubstance Abuse.  Sonographer:    Raquel Sarna Senior RDCS Referring Phys: [place]  1. Left ventricular ejection fraction, by estimation, is 30 to 35%. The left ventricle has moderately decreased function. The left ventricle demonstrates global hypokinesis. Left ventricular diastolic parameters are indeterminate.  2. Right ventricular systolic function is mildly reduced. The right ventricular size is mildly enlarged.  3. A small pericardial effusion is present. The pericardial effusion is circumferential.  4. The mitral valve is normal in structure. Trivial mitral valve regurgitation. No evidence of mitral stenosis.  5. The aortic valve is tricuspid. Aortic valve regurgitation is not visualized. Aortic valve sclerosis is present, with no evidence of aortic valve stenosis.  6. The inferior vena cava is normal in size with greater than 50% respiratory variability, suggesting right atrial pressure of 3 mmHg. Comparison(s): No significant change from prior study. FINDINGS  Left Ventricle: Left ventricular ejection fraction, by estimation, is 30 to 35%. The left ventricle has moderately decreased function. The left ventricle demonstrates global hypokinesis. The left ventricular internal cavity size was normal in size. There is no left  ventricular hypertrophy. Left ventricular diastolic parameters are indeterminate. Right Ventricle: The right ventricular size is mildly enlarged. No increase in right ventricular wall thickness. Right ventricular systolic function is mildly reduced. Left Atrium: Left atrial size was normal in size. Right Atrium: Right atrial size was normal in size. Pericardium: A small pericardial effusion is present. The pericardial effusion is circumferential. Mitral Valve: The mitral valve is normal in structure. Trivial mitral valve regurgitation. No evidence of mitral valve stenosis. Tricuspid Valve: The tricuspid valve is normal in structure. Tricuspid valve regurgitation is not demonstrated. No evidence of tricuspid stenosis. Aortic Valve: The aortic valve is tricuspid. Aortic valve regurgitation is not visualized. Aortic valve sclerosis is present, with no evidence of aortic valve stenosis. Pulmonic Valve: The pulmonic valve was normal in structure. Pulmonic valve regurgitation is not visualized. No evidence of pulmonic stenosis. Aorta: The aortic root is normal in size and structure. Venous: The inferior vena cava is normal in size with greater than 50% respiratory variability, suggesting right atrial pressure of 3 mmHg. IAS/Shunts: No atrial level shunt detected by color flow Doppler.  LEFT VENTRICLE PLAX 2D LVIDd:         4.90 cm      Diastology LVIDs:         4.40 cm      LV e' medial:    4.57 cm/s LV PW:         1.10 cm      LV E/e' medial:  11.6 LV IVS:        0.80 cm      LV e' lateral:   5.22 cm/s LVOT diam:     2.00 cm      LV E/e' lateral: 10.1 LV SV:         39 LV SV Index:  21 LVOT Area:     3.14 cm  LV Volumes (MOD) LV vol d, MOD A2C: 154.0 ml LV vol d, MOD A4C: 101.0 ml LV vol s, MOD A2C: 102.0 ml LV vol s, MOD A4C: 71.9 ml LV SV MOD A2C:     52.0 ml LV SV MOD A4C:     101.0 ml LV SV MOD BP:      38.2 ml RIGHT VENTRICLE RV S prime:     12.70 cm/s TAPSE (M-mode): 1.4 cm LEFT ATRIUM             Index         RIGHT ATRIUM           Index LA diam:        3.00 cm 1.63 cm/m   RA Area:     14.30 cm LA Vol (A2C):   50.8 ml 27.57 ml/m  RA Volume:   33.40 ml  18.12 ml/m LA Vol (A4C):   21.3 ml 11.56 ml/m LA Biplane Vol: 33.5 ml 18.18 ml/m  AORTIC VALVE LVOT Vmax:   85.40 cm/s LVOT Vmean:  61.600 cm/s LVOT VTI:    0.123 m  AORTA Ao Root diam: 3.50 cm MITRAL VALVE MV Area (PHT): 3.56 cm    SHUNTS MV Decel Time: 213 msec    Systemic VTI:  0.12 m MV E velocity: 52.90 cm/s  Systemic Diam: 2.00 cm MV A velocity: 78.10 cm/s MV E/A ratio:  0.68 Kardie Tobb DO Electronically signed by Berniece Salines DO Signature Date/Time: 07/11/2022/12:23:45 PM    Final    DG Chest 2 View  Result Date: 07/11/2022 CLINICAL DATA:  SOB EXAM: CHEST - 2 VIEW COMPARISON:  07/04/2022. FINDINGS: Cardiac silhouette is enlarged both lungs are clear. No pneumothorax or pleural effusion. There are thoracic degenerative changes. IMPRESSION: Enlarged cardiac silhouette.  Lungs are clear. Electronically Signed   By: Sammie Bench M.D.   On: 07/11/2022 10:04    Procedures Procedures    Medications Ordered in ED Medications  albuterol (VENTOLIN HFA) 108 (90 Base) MCG/ACT inhaler 2 puff (has no administration in time range)  ipratropium-albuterol (DUONEB) 0.5-2.5 (3) MG/3ML nebulizer solution 3 mL (3 mLs Nebulization Given 07/11/22 0946)    ED Course/ Medical Decision Making/ A&P                             Medical Decision Making Medical Decision Making:   Inri Gunion is a 52 y.o. male who presented to the ED today with dyspnea detailed above.    External chart has been reviewed including previous ED visits. Complicated by homelessness  Complete initial physical exam performed, notably the patient was experiencing tachypnea with normal lung sounds, slightly anxious.    Reviewed and confirmed nursing documentation for past medical history, family history, social history.    Initial Assessment:   With the patient's presentation of  dyspnea, most likely diagnosis is COPD exacerbation vs PNA. Other diagnoses were considered including (but not limited to) CHF (not likely given presentation), PAH (echo ordered), anemia (CBC ordered), cardiac etiology (EKG without ST change, trops pending). These are considered less likely due to history of present illness and physical exam findings.   This is most consistent with an acute complicated illness  Initial Plan:   Screening labs including CBC and Metabolic panel to evaluate for infectious or metabolic etiology of disease.   CXR to evaluate for structural/infectious intrathoracic pathology.  EKG to evaluate for cardiac pathology Echo to evaluate for structural change in the heart.  Objective evaluation as below reviewed   Initial Study Results:   Laboratory  All laboratory results reviewed without evidence of clinically relevant pathology.   Exceptions include: CBG 297, Trop 58>56, Na 134   EKG EKG was reviewed independently. Rate, rhythm, axis, intervals all examined and without medically relevant abnormality. ST segments without concerns for elevations.    Radiology:  All images reviewed independently. Agree with radiology report at this time.   CT Angio Chest PE W and/or Wo Contrast  1. No evidence of pulmonary embolus. 2. Marked cardiomegaly without pericardial effusion. Extensive coronary artery atherosclerosis. 3. Emphysema, with patchy right middle lobe airspace disease consistent with inflammation, aspiration, or infection.   DG Chest 2 View IMPRESSION: No acute cardiopulmonary disease.  Consults: Case discussed with [facility].   Final Assessment and Plan:   Patient with dyspnea likely secondary to COPD, possible pneumonia, deconditioning with URI symptoms.  Reassuringly, trops trended flat and chest x-ray was unremarkable as well as minimal changes to echo other than small pericardial effusion.  Provided the patient with [facility], called [facility]  to set up an appt with the patient, which they will have staff coordinate with patient.   Additionally, broadened abx to add doxycycline (Macrolide unavailable due to qTc) to Augmentin and prednisone.   Ordered Albuterol inhaler to pharmacy as he states that he does not have this.   Medically stable for discharge.   Clinical Impression:   Dyspnea   Amount and/or Complexity of Data Reviewed Labs: ordered. Radiology: ordered.  Risk Prescription drug management.          Final Clinical Impression(s) / ED Diagnoses Final diagnoses:  Dyspnea, unspecified type  COPD exacerbation ([place])    Rx / DC Orders ED Discharge Orders          Ordered    doxycycline (VIBRAMYCIN) 100 MG capsule  2 times daily        07/11/22 1536    albuterol (VENTOLIN HFA) 108 (90 Base) MCG/ACT inhaler  Every 4 hours PRN        07/11/22 1537              Erskine Emery, MD 07/11/22 1544    Erskine Emery, MD 07/11/22 1547    Carmin Muskrat, MD 07/11/22 [phone]

## 2022-07-11 NOTE — Discharge Instructions (Addendum)
Keep using asthma inhaler as needed ever 4-6 hours  Finish the Augmentin and the Prednisone  Follow up with the cardiologist  Take the doxycycline twice a day for 7 days

## 2022-07-11 NOTE — ED Notes (Signed)
Pt 's feet are very cold but have good pulses.  Socks applied.

## 2022-07-11 NOTE — ED Triage Notes (Signed)
Pt. Stated, ivew been SOB since last week. My legs are numb but its probably because of the cold outside and I've not ate anything for 2 days cause I've not anywhere to eat.

## 2022-07-11 NOTE — Progress Notes (Signed)
Echocardiogram 2D Echocardiogram has been performed.  Oneal Deputy Jerritt Cardoza RDCS 07/11/2022, 11:45 AM  Notified Dr Harriet Masson of stat

## 2022-07-21 ENCOUNTER — Other Ambulatory Visit: Payer: Self-pay

## 2022-07-21 ENCOUNTER — Emergency Department (HOSPITAL_COMMUNITY): Payer: Commercial Managed Care - HMO

## 2022-07-21 ENCOUNTER — Observation Stay (HOSPITAL_COMMUNITY): Payer: Commercial Managed Care - HMO

## 2022-07-21 ENCOUNTER — Encounter (HOSPITAL_COMMUNITY): Payer: Self-pay | Admitting: *Deleted

## 2022-07-21 ENCOUNTER — Inpatient Hospital Stay (HOSPITAL_COMMUNITY)
Admission: EM | Admit: 2022-07-21 | Discharge: 2022-07-23 | DRG: 291 | Disposition: A | Discharge status: Home / Self Care | Payer: Commercial Managed Care - HMO | Attending: Internal Medicine | Admitting: Internal Medicine

## 2022-07-21 DIAGNOSIS — Z91148 Patient's other noncompliance with medication regimen for other reason: Secondary | ICD-10-CM

## 2022-07-21 DIAGNOSIS — Z91014 Allergy to mammalian meats: Secondary | ICD-10-CM

## 2022-07-21 DIAGNOSIS — R0602 Shortness of breath: Secondary | ICD-10-CM | POA: Diagnosis not present

## 2022-07-21 DIAGNOSIS — D7589 Other specified diseases of blood and blood-forming organs: Secondary | ICD-10-CM | POA: Diagnosis present

## 2022-07-21 DIAGNOSIS — Z79899 Other long term (current) drug therapy: Secondary | ICD-10-CM

## 2022-07-21 DIAGNOSIS — D539 Nutritional anemia, unspecified: Secondary | ICD-10-CM | POA: Diagnosis not present

## 2022-07-21 DIAGNOSIS — Z1152 Encounter for screening for COVID-19: Secondary | ICD-10-CM | POA: Diagnosis not present

## 2022-07-21 DIAGNOSIS — F17210 Nicotine dependence, cigarettes, uncomplicated: Secondary | ICD-10-CM | POA: Diagnosis not present

## 2022-07-21 DIAGNOSIS — I5023 Acute on chronic systolic (congestive) heart failure: Secondary | ICD-10-CM | POA: Diagnosis present

## 2022-07-21 DIAGNOSIS — M25572 Pain in left ankle and joints of left foot: Secondary | ICD-10-CM | POA: Diagnosis present

## 2022-07-21 DIAGNOSIS — G4733 Obstructive sleep apnea (adult) (pediatric): Secondary | ICD-10-CM | POA: Diagnosis present

## 2022-07-21 DIAGNOSIS — K4090 Unilateral inguinal hernia, without obstruction or gangrene, not specified as recurrent: Secondary | ICD-10-CM | POA: Diagnosis present

## 2022-07-21 DIAGNOSIS — I10 Essential (primary) hypertension: Secondary | ICD-10-CM | POA: Diagnosis not present

## 2022-07-21 DIAGNOSIS — I130 Hypertensive heart and chronic kidney disease with heart failure and stage 1 through stage 4 chronic kidney disease, or unspecified chronic kidney disease: Secondary | ICD-10-CM | POA: Diagnosis not present

## 2022-07-21 DIAGNOSIS — J449 Chronic obstructive pulmonary disease, unspecified: Secondary | ICD-10-CM | POA: Diagnosis present

## 2022-07-21 DIAGNOSIS — I428 Other cardiomyopathies: Secondary | ICD-10-CM | POA: Diagnosis present

## 2022-07-21 DIAGNOSIS — I3139 Other pericardial effusion (noninflammatory): Secondary | ICD-10-CM | POA: Diagnosis present

## 2022-07-21 DIAGNOSIS — J811 Chronic pulmonary edema: Secondary | ICD-10-CM | POA: Diagnosis not present

## 2022-07-21 DIAGNOSIS — Z5900 Homelessness unspecified: Secondary | ICD-10-CM

## 2022-07-21 DIAGNOSIS — F1410 Cocaine abuse, uncomplicated: Secondary | ICD-10-CM | POA: Diagnosis not present

## 2022-07-21 DIAGNOSIS — R9431 Abnormal electrocardiogram [ECG] [EKG]: Secondary | ICD-10-CM | POA: Diagnosis not present

## 2022-07-21 DIAGNOSIS — G8929 Other chronic pain: Secondary | ICD-10-CM | POA: Diagnosis present

## 2022-07-21 DIAGNOSIS — D696 Thrombocytopenia, unspecified: Secondary | ICD-10-CM | POA: Diagnosis not present

## 2022-07-21 DIAGNOSIS — R0902 Hypoxemia: Secondary | ICD-10-CM | POA: Diagnosis not present

## 2022-07-21 DIAGNOSIS — J45909 Unspecified asthma, uncomplicated: Secondary | ICD-10-CM | POA: Diagnosis not present

## 2022-07-21 DIAGNOSIS — F17290 Nicotine dependence, other tobacco product, uncomplicated: Secondary | ICD-10-CM | POA: Diagnosis present

## 2022-07-21 DIAGNOSIS — I509 Heart failure, unspecified: Secondary | ICD-10-CM

## 2022-07-21 DIAGNOSIS — Z91018 Allergy to other foods: Secondary | ICD-10-CM

## 2022-07-21 DIAGNOSIS — Z560 Unemployment, unspecified: Secondary | ICD-10-CM

## 2022-07-21 DIAGNOSIS — Z8619 Personal history of other infectious and parasitic diseases: Secondary | ICD-10-CM | POA: Diagnosis present

## 2022-07-21 DIAGNOSIS — N182 Chronic kidney disease, stage 2 (mild): Secondary | ICD-10-CM | POA: Diagnosis not present

## 2022-07-21 DIAGNOSIS — B1920 Unspecified viral hepatitis C without hepatic coma: Secondary | ICD-10-CM | POA: Diagnosis not present

## 2022-07-21 DIAGNOSIS — Z91012 Allergy to eggs: Secondary | ICD-10-CM | POA: Diagnosis not present

## 2022-07-21 LAB — I-STAT CHEM 8, ED
BUN: 15 mg/dL (ref 6–20)
Calcium, Ion: 1.15 mmol/L (ref 1.15–1.40)
Chloride: 103 mmol/L (ref 98–111)
Creatinine, Ser: 1.2 mg/dL (ref 0.61–1.24)
Glucose, Bld: 123 mg/dL — ABNORMAL HIGH (ref 70–99)
HCT: 42 % (ref 39.0–52.0)
Hemoglobin: 14.3 g/dL (ref 13.0–17.0)
Potassium: 4.2 mmol/L (ref 3.5–5.1)
Sodium: 139 mmol/L (ref 135–145)
TCO2: 26 mmol/L (ref 22–32)

## 2022-07-21 LAB — COMPREHENSIVE METABOLIC PANEL
ALT: 40 U/L (ref 0–44)
AST: 41 U/L (ref 15–41)
Albumin: 2.8 g/dL — ABNORMAL LOW (ref 3.5–5.0)
Alkaline Phosphatase: 71 U/L (ref 38–126)
Anion gap: 12 (ref 5–15)
BUN: 13 mg/dL (ref 6–20)
CO2: 24 mmol/L (ref 22–32)
Calcium: 8.7 mg/dL — ABNORMAL LOW (ref 8.9–10.3)
Chloride: 102 mmol/L (ref 98–111)
Creatinine, Ser: 1.32 mg/dL — ABNORMAL HIGH (ref 0.61–1.24)
GFR, Estimated: >60 mL/min (ref >60)
Glucose, Bld: 129 mg/dL — ABNORMAL HIGH (ref 70–99)
Potassium: 4.2 mmol/L (ref 3.5–5.1)
Sodium: 138 mmol/L (ref 135–145)
Total Bilirubin: 0.9 mg/dL (ref 0.3–1.2)
Total Protein: 6 g/dL — ABNORMAL LOW (ref 6.5–8.1)

## 2022-07-21 LAB — BRAIN NATRIURETIC PEPTIDE: B Natriuretic Peptide: 1832.9 pg/mL — ABNORMAL HIGH (ref 0.0–100.0)

## 2022-07-21 LAB — LACTIC ACID, PLASMA
Lactic Acid, Venous: 1.4 mmol/L (ref 0.5–1.9)
Lactic Acid, Venous: 1.2 mmol/L (ref 0.5–1.9)

## 2022-07-21 LAB — RAPID URINE DRUG SCREEN, HOSP PERFORMED
Amphetamines: NOT DETECTED
Barbiturates: NOT DETECTED
Benzodiazepines: NOT DETECTED
Cocaine: POSITIVE — AB
Opiates: NOT DETECTED
Tetrahydrocannabinol: NOT DETECTED

## 2022-07-21 LAB — RESP PANEL BY RT-PCR (RSV, FLU A&B, COVID)  RVPGX2
Influenza A by PCR: NEGATIVE
Influenza B by PCR: NEGATIVE
Resp Syncytial Virus by PCR: NEGATIVE
SARS Coronavirus 2 by RT PCR: NEGATIVE

## 2022-07-21 LAB — TECHNOLOGIST SMEAR REVIEW: Plt Morphology: DECREASED

## 2022-07-21 LAB — ECHOCARDIOGRAM LIMITED
Area-P 1/2: 7.02 cm2
Calc EF: 33.2 %
Height: 66 in
S' Lateral: 4.5 cm
Single Plane A2C EF: 26.7 %
Single Plane A4C EF: 32.4 %
Weight: 2634.94 oz

## 2022-07-21 LAB — TROPONIN I (HIGH SENSITIVITY)
Troponin I (High Sensitivity): 96 ng/L — ABNORMAL HIGH (ref <18)
Troponin I (High Sensitivity): 92 ng/L — ABNORMAL HIGH (ref <18)

## 2022-07-21 LAB — CBC WITH DIFFERENTIAL/PLATELET
Abs Immature Granulocytes: 0.02 10*3/uL (ref 0.00–0.07)
Basophils Absolute: 0 10*3/uL (ref 0.0–0.1)
Basophils Relative: 0 %
Eosinophils Absolute: 0 10*3/uL (ref 0.0–0.5)
Eosinophils Relative: 0 %
HCT: 38.5 % — ABNORMAL LOW (ref 39.0–52.0)
Hemoglobin: 13.1 g/dL (ref 13.0–17.0)
Immature Granulocytes: 0 %
Lymphocytes Relative: 21 %
Lymphs Abs: 1.2 10*3/uL (ref 0.7–4.0)
MCH: 35.8 pg — ABNORMAL HIGH (ref 26.0–34.0)
MCHC: 34 g/dL (ref 30.0–36.0)
MCV: 105.2 fL — ABNORMAL HIGH (ref 80.0–100.0)
Monocytes Absolute: 0.6 10*3/uL (ref 0.1–1.0)
Monocytes Relative: 10 %
Neutro Abs: 3.8 10*3/uL (ref 1.7–7.7)
Neutrophils Relative %: 69 %
Platelets: 105 10*3/uL — ABNORMAL LOW (ref 150–400)
RBC: 3.66 MIL/uL — ABNORMAL LOW (ref 4.22–5.81)
RDW: 18.2 % — ABNORMAL HIGH (ref 11.5–15.5)
WBC: 5.6 10*3/uL (ref 4.0–10.5)
nRBC: 0 % (ref 0.0–0.2)

## 2022-07-21 LAB — FOLATE: Folate: 16.9 ng/mL (ref >5.9)

## 2022-07-21 LAB — MAGNESIUM: Magnesium: 2 mg/dL (ref 1.7–2.4)

## 2022-07-21 LAB — ETHANOL: Alcohol, Ethyl (B): <10 mg/dL (ref <10)

## 2022-07-21 LAB — VITAMIN B12: Vitamin B-12: 298 pg/mL (ref 180–914)

## 2022-07-21 MED ORDER — FUROSEMIDE 10 MG/ML IJ SOLN
40.0000 mg | Freq: Two times a day (BID) | INTRAMUSCULAR | Status: DC
  Administered 2022-07-21 – 2022-07-23 (×4): 40 mg via INTRAVENOUS
  Filled 2022-07-21 (×4): qty 4

## 2022-07-21 MED ORDER — ACETAMINOPHEN 325 MG PO TABS: 650.0000 mg | ORAL_TABLET | Freq: Four times a day (QID) | ORAL | Status: DC | PRN

## 2022-07-21 MED ORDER — SPIRONOLACTONE 12.5 MG HALF TABLET
12.5000 mg | ORAL_TABLET | Freq: Every day | ORAL | Status: DC
  Administered 2022-07-21 – 2022-07-23 (×3): 12.5 mg via ORAL
  Filled 2022-07-21 (×3): qty 1

## 2022-07-21 MED ORDER — LOSARTAN POTASSIUM 25 MG PO TABS
25.0000 mg | ORAL_TABLET | Freq: Every day | ORAL | Status: DC
  Administered 2022-07-21 – 2022-07-23 (×3): 25 mg via ORAL
  Filled 2022-07-21 (×3): qty 1

## 2022-07-21 MED ORDER — ACETAMINOPHEN 650 MG RE SUPP: 650.0000 mg | Freq: Four times a day (QID) | RECTAL | Status: DC | PRN

## 2022-07-21 MED ORDER — FUROSEMIDE 10 MG/ML IJ SOLN
20.0000 mg | Freq: Once | INTRAMUSCULAR | Status: AC
  Administered 2022-07-21: 20 mg via INTRAVENOUS
  Filled 2022-07-21: qty 2

## 2022-07-21 MED ORDER — RIVAROXABAN 10 MG PO TABS
10.0000 mg | ORAL_TABLET | Freq: Every day | ORAL | Status: DC
  Administered 2022-07-21 – 2022-07-23 (×3): 10 mg via ORAL
  Filled 2022-07-21 (×3): qty 1

## 2022-07-21 NOTE — Progress Notes (Signed)
  Echocardiogram 2D Echocardiogram has been performed.  Charles [name] 07/21/2022, 4:37 PM
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
## 2022-07-21 NOTE — Plan of Care (Signed)
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
## 2022-07-21 NOTE — ED Provider Notes (Signed)
Charles [name]   CSN: [id number] Arrival date & time: 07/21/22  [id number]     History  Chief Complaint  Patient presents with   Shortness of Charles [name] is a 52 y.o. male.  52 year old male with prior medical history as detailed below presents for evaluation.  Patient reports that he has been off all meds for the last week.  He reports that he ran out of his medications.  Patient reports increased shortness of breath especially with exertion over the last 4 to 5 days.  Patient reports congestion and cough over the last 3 to 4 days.  Patient is currently homeless and sleeping on the street.  Patient complains of generalized bodyaches and pain "all over".  Patient reports that he last smoked cocaine yesterday.  He denies other drug use.  The history is provided by the patient and medical records.       Home Medications Prior to Admission medications   Medication Sig Start Date End Date Taking? Authorizing Provider  acetaminophen (TYLENOL) 325 MG tablet Take 1,300-1,950 mg by mouth daily as needed for mild pain or headache.    [provider]  albuterol (VENTOLIN HFA) 108 (90 Base) MCG/ACT inhaler Inhale 2 puffs into the lungs every 4 (four) hours as needed for wheezing or shortness of breath. 07/11/22   Erskine Emery, MD  amoxicillin-clavulanate (AUGMENTIN) 875-125 MG tablet Take 1 tablet by mouth every 12 (twelve) hours. 07/04/22   Kommor, Madison, MD  furosemide (LASIX) 20 MG tablet Take 1 tablet (20 mg total) by mouth daily. 06/01/22 07/01/22  Maudie Flakes, MD  losartan (COZAAR) 25 MG tablet Take 1 tablet (25 mg total) by mouth daily. 06/01/22 07/01/22  Maudie Flakes, MD  potassium chloride (KLOR-CON M) 10 MEQ tablet Take 1 tablet (10 mEq total) by mouth daily. 06/01/22 07/01/22  Maudie Flakes, MD  spironolactone (ALDACTONE) 25 MG tablet Take 1/2 tablet (12.5 mg total) by mouth daily. 06/01/22 07/01/22  Maudie Flakes, MD      Allergies    Egg-derived products, Banana, and Pork-derived products    Review of Systems   Review of Systems  All other systems reviewed and are negative.   Physical Exam Updated Vital Signs BP (!) 203/130 (BP Location: Right Arm)   Pulse 98   Temp 98 F (36.7 C) (Oral)   Resp (!) 22   Ht '5\' 6"'$  (1.676 m)   Wt 74.8 kg   SpO2 94%   BMI 26.63 kg/m  Physical Exam Vitals and nursing [name] reviewed.  Constitutional:      General: He is not in acute distress.    Appearance: Normal appearance. He is well-developed.  HENT:     Head: Normocephalic and atraumatic.  Eyes:     Conjunctiva/sclera: Conjunctivae normal.     Pupils: Pupils are equal, round, and reactive to light.  Cardiovascular:     Rate and Rhythm: Normal rate and regular rhythm.     Heart sounds: Normal heart sounds.  Pulmonary:     Effort: Pulmonary effort is normal. No respiratory distress.     Breath sounds: Normal breath sounds.  Abdominal:     General: There is no distension.     Palpations: Abdomen is soft.     Tenderness: There is no abdominal tenderness.  Musculoskeletal:        General: No deformity. Normal range of motion.  Cervical back: Normal range of motion and neck supple.  Skin:    General: Skin is warm and dry.  Neurological:     General: No focal deficit present.     Mental Status: He is alert and oriented to person, place, and time. Mental status is at baseline.     ED Results / Procedures / Treatments   Labs (all labs ordered are listed, but only abnormal results are displayed) Labs Reviewed  CBC WITH DIFFERENTIAL/PLATELET - Abnormal; Notable for the following components:      Result Value   RBC 3.66 (*)    HCT 38.5 (*)    MCV 105.2 (*)    MCH 35.8 (*)    RDW 18.2 (*)    Platelets 105 (*)    All other components within normal limits  BRAIN NATRIURETIC PEPTIDE - Abnormal; Notable for the following components:   B Natriuretic Peptide 1,832.9 (*)    All  other components within normal limits  COMPREHENSIVE METABOLIC PANEL - Abnormal; Notable for the following components:   Glucose, Bld 129 (*)    Creatinine, Ser 1.32 (*)    Calcium 8.7 (*)    Total Protein 6.0 (*)    Albumin 2.8 (*)    All other components within normal limits  RAPID URINE DRUG SCREEN, HOSP PERFORMED - Abnormal; Notable for the following components:   Cocaine POSITIVE (*)    All other components within normal limits  I-STAT CHEM 8, ED - Abnormal; Notable for the following components:   Glucose, Bld 123 (*)    All other components within normal limits  TROPONIN I (HIGH SENSITIVITY) - Abnormal; Notable for the following components:   Troponin I (High Sensitivity) 96 (*)    All other components within normal limits  TROPONIN I (HIGH SENSITIVITY) - Abnormal; Notable for the following components:   Troponin I (High Sensitivity) 92 (*)    All other components within normal limits  RESP PANEL BY RT-PCR (RSV, FLU A&B, COVID)  RVPGX2  LACTIC ACID, PLASMA  LACTIC ACID, PLASMA    EKG EKG Interpretation  Date/Time:  Sunday July 21 2022 08:00:45 EDT Ventricular Rate:  98 PR Interval:  170 QRS Duration: 70 QT Interval:  386 QTC Calculation: 493 R Axis:   242 Text Interpretation: Sinus rhythm Probable left atrial enlargement Left anterior fascicular block Anterior infarct, old Abnormal T, consider ischemia, lateral leads Confirmed by Dene Gentry [phone]) on 07/21/2022 8:02:28 AM  Radiology No results found.  Procedures Procedures    Medications Ordered in ED Medications - No data to display  ED Course/ Medical Decision Making/ A&P                             Medical Decision Making Amount and/or Complexity of Data Reviewed Labs: ordered. Radiology: ordered.  Risk Prescription drug management. Decision regarding hospitalization.    Medical Screen Complete  This patient presented to the ED with complaint of shortness of breath, medical  noncompliance.  This complaint involves an extensive number of treatment options. The initial differential diagnosis includes, but is not limited to, CHF exacerbation, noncompliance, metabolic abnormality, polysubstance abuse, etc.  This presentation is: Acute, Chronic, Self-Limited, Previously Undiagnosed, Uncertain Prognosis, Complicated, Systemic Symptoms, and Threat to Life/Bodily Function  Patient is presenting with worsening shortness of breath and dyspnea with exertion.  Symptoms are associated with reported loss of his medications approximately 1 week ago.  Patient does admit to recent and  continued cocaine use.  Last cocaine use was yesterday.  Workup is concerning for possible CHF exacerbation with concurrent ongoing cocaine abuse.  Patient would likely benefit from admission for diuresis and continued workup.  Medicine service made aware of case and will evaluate for admission.  Additional history obtained:  External records from outside sources obtained and reviewed including prior ED visits and prior Inpatient records.    Lab Tests:  I ordered and personally interpreted labs.  The pertinent results include: CBC, troponin, CMP, lactic acid, COVID, flu, i-STAT Chem-8, BNP   Imaging Studies ordered:  I ordered imaging studies including chest x-ray I independently visualized and interpreted obtained imaging which showed vascular congestion I agree with the radiologist interpretation.   Cardiac Monitoring:  The patient was maintained on a cardiac monitor.  I personally viewed and interpreted the cardiac monitor which showed an underlying rhythm of: NSR   Medicines ordered:  I ordered medication including Lasix for CHF Reevaluation of the patient after these medicines showed that the patient: improved   Problem List / ED Course:  Shortness of breath, cocaine abuse   Reevaluation:  After the interventions noted above, I reevaluated the patient and found that  they have: improved   Disposition:  After consideration of the diagnostic results and the patients response to treatment, I feel that the patent would benefit from admission.          Final Clinical Impression(s) / ED Diagnoses Final diagnoses:  SOB (shortness of breath)    Rx / DC Orders ED Discharge Orders     None         Valarie Merino, MD 07/21/22 1130

## 2022-07-21 NOTE — ED Triage Notes (Signed)
Presents to ed via [employer] states he is homeless and has been seen in the ed 3 weeks ago for sob and states he has lost his medication , c/o hands and feet swelling at times. C/o generalized bodyaches.

## 2022-07-21 NOTE — Progress Notes (Addendum)
PT has all of his personal belongings with him including clothing (3 bags); id.  PT will not disclose other items.   1528 Dr. Oris Drone notified via secure chat that PT has elevated BP 166/108 with HR in low 100s. Also twitching. Concern for withdrawal of Cocaine with significant body twitching. Order for Etoh level provided. PT initially wanted to refuse lab, made aware nursing would have to tell provider. PT stated he would allow it just once.   [place] Dr. Oris Drone notified via secure chat that PT had BP 170/139, HR 92.  Provider asked it PT had had two of his cardiac medications. Updated that they were given in the ED at 1213, this RN had given Lasix and Xarelto. Awating response. 1859, Dr. Oris Drone responded may need to add Amlodipine. No new order at this time.

## 2022-07-22 DIAGNOSIS — I509 Heart failure, unspecified: Secondary | ICD-10-CM

## 2022-07-22 DIAGNOSIS — I130 Hypertensive heart and chronic kidney disease with heart failure and stage 1 through stage 4 chronic kidney disease, or unspecified chronic kidney disease: Principal | ICD-10-CM

## 2022-07-22 DIAGNOSIS — G8929 Other chronic pain: Secondary | ICD-10-CM | POA: Diagnosis present

## 2022-07-22 DIAGNOSIS — D7589 Other specified diseases of blood and blood-forming organs: Secondary | ICD-10-CM | POA: Diagnosis present

## 2022-07-22 DIAGNOSIS — B1920 Unspecified viral hepatitis C without hepatic coma: Secondary | ICD-10-CM | POA: Diagnosis present

## 2022-07-22 DIAGNOSIS — N182 Chronic kidney disease, stage 2 (mild): Secondary | ICD-10-CM | POA: Diagnosis not present

## 2022-07-22 DIAGNOSIS — F1410 Cocaine abuse, uncomplicated: Secondary | ICD-10-CM | POA: Diagnosis not present

## 2022-07-22 DIAGNOSIS — K4090 Unilateral inguinal hernia, without obstruction or gangrene, not specified as recurrent: Secondary | ICD-10-CM | POA: Diagnosis present

## 2022-07-22 DIAGNOSIS — G4733 Obstructive sleep apnea (adult) (pediatric): Secondary | ICD-10-CM | POA: Diagnosis present

## 2022-07-22 DIAGNOSIS — I10 Essential (primary) hypertension: Secondary | ICD-10-CM | POA: Diagnosis present

## 2022-07-22 DIAGNOSIS — Z79899 Other long term (current) drug therapy: Secondary | ICD-10-CM | POA: Diagnosis not present

## 2022-07-22 DIAGNOSIS — F17210 Nicotine dependence, cigarettes, uncomplicated: Secondary | ICD-10-CM | POA: Diagnosis not present

## 2022-07-22 DIAGNOSIS — Z91014 Allergy to mammalian meats: Secondary | ICD-10-CM | POA: Diagnosis not present

## 2022-07-22 DIAGNOSIS — Z1152 Encounter for screening for COVID-19: Secondary | ICD-10-CM | POA: Diagnosis not present

## 2022-07-22 DIAGNOSIS — D696 Thrombocytopenia, unspecified: Secondary | ICD-10-CM | POA: Diagnosis not present

## 2022-07-22 DIAGNOSIS — Z5900 Homelessness unspecified: Secondary | ICD-10-CM | POA: Diagnosis not present

## 2022-07-22 DIAGNOSIS — Z91148 Patient's other noncompliance with medication regimen for other reason: Secondary | ICD-10-CM | POA: Diagnosis not present

## 2022-07-22 DIAGNOSIS — F17290 Nicotine dependence, other tobacco product, uncomplicated: Secondary | ICD-10-CM | POA: Diagnosis present

## 2022-07-22 DIAGNOSIS — Z91012 Allergy to eggs: Secondary | ICD-10-CM | POA: Diagnosis not present

## 2022-07-22 DIAGNOSIS — R0602 Shortness of breath: Secondary | ICD-10-CM | POA: Diagnosis present

## 2022-07-22 DIAGNOSIS — D539 Nutritional anemia, unspecified: Secondary | ICD-10-CM | POA: Diagnosis not present

## 2022-07-22 DIAGNOSIS — I428 Other cardiomyopathies: Secondary | ICD-10-CM | POA: Diagnosis present

## 2022-07-22 DIAGNOSIS — J449 Chronic obstructive pulmonary disease, unspecified: Secondary | ICD-10-CM | POA: Diagnosis present

## 2022-07-22 DIAGNOSIS — Z91018 Allergy to other foods: Secondary | ICD-10-CM | POA: Diagnosis not present

## 2022-07-22 DIAGNOSIS — M25572 Pain in left ankle and joints of left foot: Secondary | ICD-10-CM | POA: Diagnosis present

## 2022-07-22 DIAGNOSIS — I5023 Acute on chronic systolic (congestive) heart failure: Secondary | ICD-10-CM | POA: Diagnosis not present

## 2022-07-22 DIAGNOSIS — I3139 Other pericardial effusion (noninflammatory): Secondary | ICD-10-CM | POA: Diagnosis present

## 2022-07-22 DIAGNOSIS — Z8619 Personal history of other infectious and parasitic diseases: Secondary | ICD-10-CM | POA: Diagnosis present

## 2022-07-22 LAB — BASIC METABOLIC PANEL
Anion gap: 10 (ref 5–15)
BUN: 17 mg/dL (ref 6–20)
CO2: 25 mmol/L (ref 22–32)
Calcium: 8.6 mg/dL — ABNORMAL LOW (ref 8.9–10.3)
Chloride: 102 mmol/L (ref 98–111)
Creatinine, Ser: 1.43 mg/dL — ABNORMAL HIGH (ref 0.61–1.24)
GFR, Estimated: 59 mL/min — ABNORMAL LOW (ref >60)
Glucose, Bld: 109 mg/dL — ABNORMAL HIGH (ref 70–99)
Potassium: 3.9 mmol/L (ref 3.5–5.1)
Sodium: 137 mmol/L (ref 135–145)

## 2022-07-22 LAB — MAGNESIUM: Magnesium: 2.2 mg/dL (ref 1.7–2.4)

## 2022-07-22 MED ORDER — WHITE PETROLATUM EX OINT
TOPICAL_OINTMENT | Freq: Every day | CUTANEOUS | Status: DC
  Administered 2022-07-22 – 2022-07-23 (×2): 0.2 via TOPICAL
  Filled 2022-07-22 (×2): qty 28.35

## 2022-07-22 MED ORDER — EMPAGLIFLOZIN 10 MG PO TABS
10.0000 mg | ORAL_TABLET | Freq: Every day | ORAL | Status: DC
  Administered 2022-07-22 – 2022-07-23 (×2): 10 mg via ORAL
  Filled 2022-07-22 (×2): qty 1

## 2022-07-22 NOTE — Progress Notes (Signed)
Heart Failure Navigator Progress Note  Assessed for Heart & Vascular TOC clinic readiness.  Patient does not meet criteria due to [facility] .   Navigator will sign off at this time.    Shalev Helminiak, BSN, RN Heart Failure Nurse Navigator Secure Chat Only   

## 2022-07-22 NOTE — Plan of Care (Signed)

## 2022-07-22 NOTE — Hospital Course (Addendum)
Principal Problem:   Acute decompensated heart failure (HCC) Active Problems:   Cocaine use disorder ([place])   Essential hypertension   History of hepatitis C  Resolved Problems:   SOB (shortness of breath)  Consults:***  Procedures:***  Follow-up items: - PFT? - Referral to clinic - OSA? - Hep C  1. Left ventricular ejection fraction, by estimation, is 30 to 35%. The  left ventricle has moderately decreased function. The left ventricle  demonstrates global hypokinesis. There is moderate left ventricular  hypertrophy.   2. Right ventricular systolic function is normal. The right ventricular  size is normal. There is mildly elevated pulmonary artery systolic  pressure.   3. Limited echo to evaluate LV function   Acute decompensated heart failure Patient presented with shortness of breath for the last week. He had associated non-radiating central chest pain with diaphoresis that improved with rest. He ran out of home medications as he is unhomed and frequently loses them. He also uses cocaine most days. BNP markedly elevated at 1832, EKG  Brisk response to total of 60 mg IV furosemide yesterday with approximately 5 L of urine output.  Feels subjectively improved.  Volume exam improving, no appreciable JVD today.  Does not appear to be in a low output state.  Currently on ARB and MRA.  Will start SGLT2 inhibitor today.  Hold beta-blocker in setting of daily cocaine use and no stated desire to quit at this time.  Continue diuresing.  Meds to TOC on discharge. - Start empagliflozin 10 mg daily - Furosemide 40 mg IV twice daily - Losartan 25 mg - Spironolactone 12.5 mg   Hypertension Antihypertensives with GDMT as above.  BP still quite high, will uptitrate GDMT slowly to achieve control.   CKD G2 Today with marginal bump in the creatinine.  GFR estimated at 59.  Treating as secondary to volume overload. - A.m. BMP   Thrombocytopenia Macrocytosis Chronic and stable.  B12 and  folate levels normal.  Wonder if low platelets may be due to some underlying liver disease.  Liver scores, including fib 4 are equivocal.  Nothing further for this now.   Cocaine use disorder Smokes daily to achieve pain relief.  Does not seem amenable to cutting back or abstaining at this point.  Acknowledges harms to lungs and heart from drug use.   History of hepatitis C - HCV RNA quant pending   Transitions of care Homeless for the last 5 years.  Sleeps at the [facility] most nights.  This is a barrier to medication adherence.  He states that he moves around a lot and medications tend to get lost in the shuffle.  Sent meds to [facility] on discharge.

## 2022-07-22 NOTE — Progress Notes (Addendum)
Interval history Brisk response to diuretics with almost 5 L out yesterday.  Symptoms improving somewhat.  Less dyspnea at rest.  Still reports some chest congestion.  Slept well last night.  Reports that he uses cocaine for chronic pain.  Because his pain is still ongoing, he does not anticipate abstaining from cocaine use.  Has been homeless for approximately 5 years.  Physical exam Blood pressure (!) 171/118, pulse 94, temperature 98.5 F (36.9 C), temperature source Oral, resp. rate 18, height '5\' 6"'$  (1.676 m), weight 68.7 kg, SpO2 100 %.  Comfortable appearing Heart rate and rhythm regular, no murmurs, no appreciable JVD, 1+ pitting edema halfway to knee bilateral legs, bilateral DP pulses 2+ Breathing is regular and unlabored on room air, no crackles or wheezes appreciated Skin is warm and dry No gross neurologic deficits Mood and affect are concordant  Intake/Output Summary (Last 24 hours) at 07/22/2022 1206 Last data filed at 07/22/2022 1051 Gross per 24 hour  Intake 480 ml  Output 5850 ml  Net -5370 ml   Net IO Since Admission: -5,970 mL [07/22/22 1206]  Labs, images, and other studies Sodium 137 Potassium 3.9 Creatinine 1.43  Assessment and plan Hospital day 0  Charles [name] is a 52 y.o. with history of nonischemic cardiomyopathy and chronic combined systolic and diastolic heart failure, cocaine use disorder, and hepatitis C admitted for acute decompensated heart failure in setting of medication nonadherence and daily cocaine use.  Principal Problem:   Acute decompensated heart failure (HCC) Active Problems:   Cocaine use disorder (HCC)   Essential hypertension   History of hepatitis C  Acute decompensated heart failure Brisk response to total of 60 mg IV furosemide yesterday with approximately 5 L of urine output.  Feels subjectively improved.  Volume exam improving, no appreciable JVD today.  Does not appear to be in a low output state.   Currently on ARB and MRA.  Will start SGLT2 inhibitor today.  Hold beta-blocker in setting of daily cocaine use and no stated desire to quit at this time.  Continue diuresing.  Meds to TOC on discharge. - Start empagliflozin 10 mg daily - Furosemide 40 mg IV twice daily - Losartan 25 mg - Spironolactone 12.5 mg  Hypertension Antihypertensives with GDMT as above.  BP still quite high, will uptitrate GDMT slowly to achieve control.  CKD G2 Today with marginal bump in the creatinine.  GFR estimated at 59.  Treating as secondary to volume overload. - A.m. BMP  Thrombocytopenia Macrocytosis Chronic and stable.  B12 and folate levels normal.  Wonder if low platelets may be due to some underlying liver disease.  Liver scores, including fib 4 are equivocal.  Nothing further for this now.  Cocaine use disorder Smokes daily to achieve pain relief.  Does not seem amenable to cutting back or abstaining at this point.  Acknowledges harms to lungs and heart from drug use.  History of hepatitis C - HCV RNA quant pending  Transitions of care Homeless for the last 5 years.  [facility]le[facility] the [facility] nights.  This is a barrier to medication adherence.  He states that he moves around a lot and medications tend to get lost in the shuffle.  Sent meds to [place] on discharge.  Diet: Regular IVF: No fluids, diuresing VTE: Rivaroxaban 10 mg Code: Full  Discharge plan: Pending treatment of acute decompensated heart failure  Nani Gasser MD 07/22/2022, 12:06 PM  Pager: [phone] After 5pm on weekdays and 1pm on weekends: [phone]

## 2022-07-22 NOTE — Consult Note (Signed)
[facility] Nurse Consult Note: Reason for Consult: Unhoused patient admitted for CHF exacerbation, noncompliance with medication regimen, stating he ran out of medicine.  Polysubstance abuse.  COnsulted for deep callous to left lateral heel.  It is intact at this time, but he has been shaving it down with a rock and a metal file, he states Right lateral heel with minor callous present Wound type: Callous Pressure Injury POA: NA Measurement: 1 cm x 4 cm raised callous with linear indentions from filing, but intact Wound [id number], intact callous Drainage (amount, consistency, odor) none Periwound: Dry skin to bilateral feet.  Dressing procedure/placement/frequency: Petrolatum to bilateral feet and heels.  Will not follow at this time.  Please re-consult if needed.  Estrellita Ludwig MSN, RN, FNP-BC CWON Wound, Ostomy, Continence Nurse [facility] [phone] Pager [phone]

## 2022-07-23 ENCOUNTER — Other Ambulatory Visit (HOSPITAL_COMMUNITY): Payer: Self-pay

## 2022-07-23 DIAGNOSIS — I130 Hypertensive heart and chronic kidney disease with heart failure and stage 1 through stage 4 chronic kidney disease, or unspecified chronic kidney disease: Secondary | ICD-10-CM | POA: Diagnosis not present

## 2022-07-23 DIAGNOSIS — Z1152 Encounter for screening for COVID-19: Secondary | ICD-10-CM | POA: Diagnosis not present

## 2022-07-23 DIAGNOSIS — I5023 Acute on chronic systolic (congestive) heart failure: Secondary | ICD-10-CM | POA: Diagnosis not present

## 2022-07-23 DIAGNOSIS — N182 Chronic kidney disease, stage 2 (mild): Secondary | ICD-10-CM | POA: Diagnosis not present

## 2022-07-23 LAB — MAGNESIUM: Magnesium: 2.2 mg/dL (ref 1.7–2.4)

## 2022-07-23 LAB — HCV RNA QUANT
HCV Quantitative Log: 6.843 log10 IU/mL (ref >1.70)
HCV Quantitative: 6970000 IU/mL (ref >50)

## 2022-07-23 LAB — BASIC METABOLIC PANEL
Anion gap: 10 (ref 5–15)
BUN: 25 mg/dL — ABNORMAL HIGH (ref 6–20)
CO2: 26 mmol/L (ref 22–32)
Calcium: 8.5 mg/dL — ABNORMAL LOW (ref 8.9–10.3)
Chloride: 101 mmol/L (ref 98–111)
Creatinine, Ser: 1.45 mg/dL — ABNORMAL HIGH (ref 0.61–1.24)
GFR, Estimated: 58 mL/min — ABNORMAL LOW (ref >60)
Glucose, Bld: 140 mg/dL — ABNORMAL HIGH (ref 70–99)
Potassium: 3.8 mmol/L (ref 3.5–5.1)
Sodium: 137 mmol/L (ref 135–145)

## 2022-07-23 MED ORDER — LOSARTAN POTASSIUM 25 MG PO TABS: 25.0000 mg | ORAL_TABLET | Freq: Every day | ORAL | 2 refills | Status: DC

## 2022-07-23 MED ORDER — ALBUTEROL SULFATE HFA 108 (90 BASE) MCG/ACT IN AERS
2.0000 | INHALATION_SPRAY | RESPIRATORY_TRACT | 2 refills | Status: DC | PRN
  Filled 2022-07-23: qty 6.7, 30d supply, fill #0

## 2022-07-23 MED ORDER — POTASSIUM CHLORIDE CRYS ER 20 MEQ PO TBCR
40.0000 meq | EXTENDED_RELEASE_TABLET | Freq: Two times a day (BID) | ORAL | Status: DC
  Administered 2022-07-23: 40 meq via ORAL
  Filled 2022-07-23: qty 2

## 2022-07-23 MED ORDER — CARVEDILOL 3.125 MG PO TABS
3.1250 mg | ORAL_TABLET | Freq: Two times a day (BID) | ORAL | Status: DC
  Administered 2022-07-23 (×2): 3.125 mg via ORAL
  Filled 2022-07-23 (×2): qty 1

## 2022-07-23 MED ORDER — SPIRONOLACTONE 25 MG PO TABS: 12.5000 mg | ORAL_TABLET | Freq: Every day | ORAL | 2 refills | Status: DC

## 2022-07-23 MED ORDER — POTASSIUM CHLORIDE CRYS ER 10 MEQ PO TBCR: 10.0000 meq | EXTENDED_RELEASE_TABLET | Freq: Every day | ORAL | 2 refills | Status: DC

## 2022-07-23 MED ORDER — EMPAGLIFLOZIN 10 MG PO TABS
10.0000 mg | ORAL_TABLET | Freq: Every day | ORAL | 2 refills | Status: DC
  Filled 2022-07-23: qty 30, 30d supply, fill #0

## 2022-07-23 MED ORDER — SPIRONOLACTONE 25 MG PO TABS
12.5000 mg | ORAL_TABLET | Freq: Every day | ORAL | 2 refills | Status: DC
  Filled 2022-07-23: qty 15, 30d supply, fill #0

## 2022-07-23 MED ORDER — LOSARTAN POTASSIUM 25 MG PO TABS
25.0000 mg | ORAL_TABLET | Freq: Every day | ORAL | 2 refills | Status: DC
  Filled 2022-07-23: qty 30, 30d supply, fill #0

## 2022-07-23 MED ORDER — CARVEDILOL 3.125 MG PO TABS
3.1250 mg | ORAL_TABLET | Freq: Two times a day (BID) | ORAL | 2 refills | Status: DC
  Filled 2022-07-23: qty 60, 30d supply, fill #0

## 2022-07-23 MED ORDER — POTASSIUM CHLORIDE CRYS ER 10 MEQ PO TBCR
10.0000 meq | EXTENDED_RELEASE_TABLET | Freq: Every day | ORAL | 2 refills | Status: DC
  Filled 2022-07-23: qty 30, 30d supply, fill #0

## 2022-07-23 MED ORDER — FUROSEMIDE 40 MG PO TABS: 40.0000 mg | ORAL_TABLET | Freq: Every day | ORAL | Status: DC

## 2022-07-23 MED ORDER — FUROSEMIDE 40 MG PO TABS
40.0000 mg | ORAL_TABLET | Freq: Every day | ORAL | 2 refills | Status: DC
  Filled 2022-07-23: qty 30, 30d supply, fill #0

## 2022-07-23 NOTE — Discharge Summary (Cosign Needed)
Name: Charles [name] MRN: [id number] DOB: [birth year] 52 y.o. PCP: Lucianne Lei, MD  Date of Admission: 07/21/2022  7:33 AM Date of Discharge: 07/23/22 Attending Physician: Dr. Angelia Mould  Discharge Diagnosis: Principal Problem:   Acute on chronic systolic CHF (congestive heart failure) ([place]) Active Problems:   Cocaine use disorder ([place])   Essential hypertension   History of hepatitis C    Discharge Medications: Allergies as of 07/23/2022       Reactions   Egg-derived Products Nausea And Vomiting   Banana Nausea And Vomiting   Pork-derived Products Nausea And Vomiting        Medication List     STOP taking these medications    amoxicillin-clavulanate 875-125 MG tablet Commonly known as: AUGMENTIN       TAKE these medications    albuterol 108 (90 Base) MCG/ACT inhaler Commonly known as: VENTOLIN HFA Inhale 2 puffs into the lungs as needed for wheezing or shortness of breath.   carvedilol 3.125 MG tablet Commonly known as: COREG Take 1 tablet (3.125 mg total) by mouth 2 (two) times daily with a meal.   furosemide 40 MG tablet Commonly known as: LASIX Take 1 tablet (40 mg total) by mouth daily. Start taking on: July 24, 2022 What changed:  medication strength how much to take   Jardiance 10 MG Tabs tablet Generic drug: empagliflozin Take 1 tablet (10 mg total) by mouth daily.   losartan 25 MG tablet Commonly known as: COZAAR Take 1 tablet (25 mg total) by mouth daily.   potassium chloride 10 MEQ tablet Commonly known as: KLOR-CON M Take 1 tablet (10 mEq total) by mouth daily.   spironolactone 25 MG tablet Commonly known as: ALDACTONE Take 1/2 tablet (12.5 mg total) by mouth daily.        Disposition and follow-up:   Charles [name] was discharged from [facility] in Stable condition.  At the hospital follow up visit please address:  1.  Follow-up: a. HFrEF - check volume status and if he has been able to obtain  medications   b. Hepatitis C quant elevated at 6,970,000. He will need ID consult for treatment.  c. Thrombocytopenia- question if underlying liver disease, consider Fib-4 score and elastography  d. Unhomed- social work referral  2.  Labs / imaging needed at time of follow-up: BMP, CBC  3.  Pending labs/ test needing follow-up: none  Follow-up Appointments:  Follow-up Information     Atway, Rayann N, DO Follow up on 08/01/2022.   Specialty: Internal Medicine Why: Please arrive 15 minutes early for your appointment at 3:45 PM. Contact information: [place] [address] [phone]                 Hospital Course by problem list:  Acute decompensated systolic heart failure Patient presented with shortness of breath for the last week. He had associated non-radiating central chest pain with diaphoresis that improved with rest. He ran out of home medications as he is unhomed and frequently loses them. He also uses cocaine most days. BNP markedly elevated at 1832, EKG without evidence of STEMI.  ACS not thought to be contributing to his presentation.  Limited echo showed stably depressed LVEF.  Underlying etiology was thought to be secondary to daily cocaine use and medication nonadherence.  He had brisk response to IV furosemide and was net -7 L on day of discharge.  Meds prescribed to [facility] pharmacy included ARB, MRA,  low-dose carvedilol, SGLT2 inhibitor.   Transitions of care Homeless for the last 5 years.  Sleeps at the [facility] most nights.  This is a barrier to medication adherence.  He states that he moves around a lot and medications tend to get lost in the shuffle.  Sent meds to [facility] on discharge.   Discharge Subjective: Patient evaluated at bedside this AM.  Feeling well, eager to leave the hospital.  Discharge Exam:   BP 112/74 (BP Location: Left Arm)   Pulse (!) 46   Temp 98.5 F (36.9 C) (Oral)   Resp 18   Ht '5\' 6"'$  (1.676 m)   Wt 68.8 kg    SpO2 100%   BMI 24.48 kg/m  Constitutional: well-appearing, sitting in bed, in no acute distress HENT: normocephalic atraumatic, mucous membranes moist Eyes: conjunctiva non-erythematous Neck: supple Cardiovascular: regular rate and rhythm, no m/r/g, no JVD appreciated, no lower extremity edema Pulmonary/Chest: normal work of breathing on room air, lungs clear to auscultation bilaterally Abdominal: soft, non-tender, non-distended MSK: normal bulk and tone Neurological: alert & oriented x 3, 5/5 strength in bilateral upper and lower extremities, normal gait Skin: warm and dry Psych: Pleasant, mood and affect concordant  Pertinent Labs, Studies, and Procedures:     Latest Ref Rng & Units 07/21/2022    8:12 AM 07/21/2022    7:58 AM 07/11/2022    9:12 AM  CBC  WBC 4.0 - 10.5 K/uL  5.6  4.2   Hemoglobin 13.0 - 17.0 g/dL 14.3  13.1  15.7   Hematocrit 39.0 - 52.0 % 42.0  38.5  45.5   Platelets 150 - 400 K/uL  105  126        Latest Ref Rng & Units 07/23/2022   12:47 AM 07/22/2022   12:39 AM 07/21/2022    8:12 AM  CMP  Glucose 70 - 99 mg/dL 140  109  123   BUN 6 - 20 mg/dL '25  17  15   '$ Creatinine 0.61 - 1.24 mg/dL 1.45  1.43  1.20   Sodium 135 - 145 mmol/L 137  137  139   Potassium 3.5 - 5.1 mmol/L 3.8  3.9  4.2   Chloride 98 - 111 mmol/L 101  102  103   CO2 22 - 32 mmol/L 26  25    Calcium 8.9 - 10.3 mg/dL 8.5  8.6      ECHOCARDIOGRAM LIMITED  Result Date: 07/21/2022    ECHOCARDIOGRAM LIMITED REPORT   Patient Name:   Charles [name] Date of Exam: 07/21/2022 Medical Rec #:  [id number]       Height:       66.0 in Accession #:    [id number]      Weight:       164.7 lb Date of Birth:  08/10/1970       BSA:          1.841 m Patient Age:    61 years        BP:           107/96 mmHg Patient Gender: M               HR:           98 bpm. Exam Location:  Inpatient Procedure: Limited Echo, Cardiac Doppler and Limited Color Doppler Indications:    Congestive Heart Failure I50.9                  Abnormal ECG  R94.31  History:        Patient has prior history of Echocardiogram examinations, most                 recent 07/11/2022. CHF, polysubstance abuse,                 Signs/Symptoms:Shortness of Breath; Risk Factors:Current Smoker,                 Hypertension and Sleep Apnea.  Sonographer:    Greer Pickerel Referring Phys: [id number] PETER C MESSICK  Sonographer Comments: Image acquisition challenging due to respiratory motion. IMPRESSIONS  1. Left ventricular ejection fraction, by estimation, is 30 to 35%. The left ventricle has moderately decreased function. The left ventricle demonstrates global hypokinesis. There is moderate left ventricular hypertrophy.  2. Right ventricular systolic function is normal. The right ventricular size is normal. There is mildly elevated pulmonary artery systolic pressure.  3. Limited echo to evaluate LV function FINDINGS  Left Ventricle: Left ventricular ejection fraction, by estimation, is 30 to 35%. The left ventricle has moderately decreased function. The left ventricle demonstrates global hypokinesis. The left ventricular internal cavity size was normal in size. There is moderate left ventricular hypertrophy. Right Ventricle: The right ventricular size is normal. Right vetricular wall thickness was not well visualized. Right ventricular systolic function is normal. There is mildly elevated pulmonary artery systolic pressure. The tricuspid regurgitant velocity  is 2.76 m/s, and with an assumed right atrial pressure of 8 mmHg, the estimated right ventricular systolic pressure is [id number] mmHg. Pericardium: There is no evidence of pericardial effusion. Mitral Valve: There is mild thickening of the mitral valve leaflet(s). There is mild calcification of the mitral valve leaflet(s). Mild mitral annular calcification. Additional Comments: Spectral Doppler performed. Color Doppler performed.  LEFT VENTRICLE PLAX 2D LVIDd:         5.30 cm LVIDs:         4.50 cm LV PW:         1.30 cm  LV IVS:        1.30 cm  LV Volumes (MOD) LV vol d, MOD A2C: 165.0 ml LV vol d, MOD A4C: 170.0 ml LV vol s, MOD A2C: 121.0 ml LV vol s, MOD A4C: 115.0 ml LV SV MOD A2C:     44.0 ml LV SV MOD A4C:     170.0 ml LV SV MOD BP:      59.8 ml LEFT ATRIUM         Index LA diam:    4.20 cm 2.28 cm/m  MITRAL VALVE                TRICUSPID VALVE MV Area (PHT): 7.02 cm     TR Peak grad:   30.5 mmHg MV Decel Time: 108 msec     TR Vmax:        276.00 cm/s MV E velocity: 148.00 cm/s MV A velocity: 64.70 cm/s MV E/A ratio:  2.29 Carlyle Dolly MD Electronically signed by Carlyle Dolly MD Signature Date/Time: 07/21/2022/4:39:39 PM    Final    DG Chest Port 1 View  Result Date: 07/21/2022 CLINICAL DATA:  Shortness of breath. EXAM: PORTABLE CHEST 1 VIEW COMPARISON:  07/11/2022 FINDINGS: The heart is enlarged and stable in configuration. There are diffuse interstitial changes consistent with pulmonary edema. No discrete consolidations. Suspect small RIGHT pleural effusion. Chronic deformity of the RIGHT distal clavicle. IMPRESSION: 1. Cardiomegaly and mild pulmonary edema. 2. Suspect small RIGHT pleural effusion.  Electronically Signed   By: Nolon Nations M.D.   On: 07/21/2022 08:18     Discharge Instructions: Discharge Instructions     (HEART FAILURE PATIENTS) Call MD:  Anytime you have any of the following symptoms: 1) 3 pound weight gain in 24 hours or 5 pounds in 1 week 2) shortness of breath, with or without a dry hacking cough 3) swelling in the hands, feet or stomach 4) if you have to sleep on extra pillows at night in order to breathe.   Complete by: As directed    Diet - low sodium heart healthy   Complete by: As directed    Increase activity slowly   Complete by: As directed    No wound care   Complete by: As directed       Signed: Nani Gasser, MD 07/23/2022, 8:42 PM   Pager: [phone]

## 2022-07-23 NOTE — TOC Progression Note (Signed)
Transition of Care [facility]) - Progression Note    Patient Details  Name: Charles [name] MRN: [id number] Date of Birth: November 14, 1970  Transition of Care [facility]) CM/SW Contact  Bjorn Pippin, LCSW Phone Number: 07/23/2022, 3:37 PM  Clinical Narrative:    MD notified CSW pt was in need of a bus pass and some clothing. CSW provided pt with a bag of some clothing and bus passes.   TOC will remain available should any additional needs arise.         Expected Discharge Plan and Services                                               Social Determinants of Health (SDOH) Interventions SDOH Screenings   Food Insecurity: Unknown (07/21/2022)  Housing: Low Risk  (03/03/2022)  Transportation Needs: Unknown (07/21/2022)  Utilities: Unknown (07/21/2022)  Tobacco Use: High Risk (07/21/2022)    Readmission Risk Interventions    05/28/2021    2:11 PM  Readmission Risk Prevention Plan  Transportation Screening Complete  PCP or Specialist Appt within 3-5 Days Complete  HRI or [place] Complete  Social Work Consult for [place] Planning/Counseling Complete  Palliative Care Screening Not Applicable  Medication Review Press photographer) Referral to Pharmacy   Beckey Rutter, MSW, [place], LCASA Transitions of Care  Clinical Social Worker I

## 2022-07-23 NOTE — Progress Notes (Incomplete)
                 Interval history ***  Physical exam Blood pressure (!) 154/98, pulse 96, temperature 98.3 F (36.8 C), temperature source Oral, resp. rate 16, height 5\' 6"  (1.676 m), weight 68.8 kg, SpO2 100 %.  ***  Weight change: -6.044 kg   Intake/Output Summary (Last 24 hours) at 07/23/2022 0646 Last data filed at 07/22/2022 2300 Gross per 24 hour  Intake 1560 ml  Output 3700 ml  Net -2140 ml   Net IO Since Admission: -7,190 mL [07/23/22 0646]  Labs, images, and other studies ***  Assessment and plan Hospital day 1  Charles [name] is a 52 y.o. ***  Principal Problem:   Acute decompensated heart failure (HCC) Active Problems:   Cocaine use disorder (HCC)   Essential hypertension   History of hepatitis C   Acute exacerbation of CHF (congestive heart failure) (HCC)   Diet: *** IVF: *** VTE: *** Code: *** PT/OT recommendations: *** TOC recommendations: *** Family Update: ***  Discharge plan: Nani Gasser MD 07/23/2022, 6:46 AM  Pager: [phone] After 5pm on weekdays and 1pm on weekends: [phone]

## 2022-07-23 NOTE — Discharge Instructions (Addendum)
Mr. Charles [name]  You were evaluated and treated in the hospital for shortness of breath, swelling, and chest pain.  The results of your evaluation did not show signs of a heart attack.  It is likely that you had worsening of your heart failure due to cocaine use and discontinuation of your heart medicine.  You were treated with medicine to bring your blood pressure down, and help you pee off your extra fluid which was causing your swelling.  We are discharging you now that you are doing better. To help assist you on your road to recovery, I have written the following recommendations:   Start taking the following medication: - Carvedilol 1 tablet twice daily - Empagliflozin 1 tablet daily  Continue taking: - Losartan 1 tablet daily - Potassium chloride 1 tablet daily - Spironolactone 1/2 tablet daily - Furosemide 1 tablet daily  The best thing you can do for your health is to quit using cocaine.  Your doctors can help you with this.  Please go to your appointment with Dr. Raymondo Band on 08/01/2022.  It was a privilege to be a part of your hospital care team, and I hope you feel better as a result of your stay.  All the best, Nani Gasser, MD

## 2022-07-23 NOTE — Plan of Care (Signed)

## 2022-07-23 NOTE — TOC Transition Note (Signed)
Transition of Care [facility]) - CM/SW Discharge Note   Patient Details  Name: Charles [name] MRN: [id number] Date of Birth: 02-Sep-1970  [facility]) CM/SW Contact:  Zenon Mayo, RN Phone Number: 07/23/2022, 4:34 PM   Clinical Narrative:    Patient is homeless, he states he will be going to the [facility] at discharge.  TOC filled medications for him.  CSW gave patient clothes and bus pass to get to [facility].  He has  follow up apt on [facility].   Final next level of care: Homeless Shelter Barriers to Discharge: No Barriers Identified   Patient Goals and CMS Choice   Choice offered to / list presented to : NA  Discharge Placement                         Discharge Plan and Services Additional resources added to the After Visit Summary for   In-house Referral: NA Discharge Planning Services: CM Consult Post Acute Care Choice: NA          DME Arranged: N/A DME Agency: NA       HH Arranged: NA          Social Determinants of Health (SDOH) Interventions SDOH Screenings   Food Insecurity: Unknown (07/21/2022)  Housing: Low Risk  (03/03/2022)  Transportation Needs: Unknown (07/21/2022)  Utilities: Unknown (07/21/2022)  Tobacco Use: High Risk (07/21/2022)     Readmission Risk Interventions    07/23/2022    4:29 PM 05/28/2021    2:11 PM  Readmission Risk Prevention Plan  Transportation Screening Complete Complete  PCP or Specialist Appt within 3-5 Days  Complete  HRI or [place]  Complete  Social Work Consult for [place] Planning/Counseling  [place] Screening  Not Applicable  Medication Review Press photographer) Complete Referral to Pharmacy  PCP or Specialist appointment within 3-5 days of discharge Complete   HRI or [place] Complete   SW Recovery Care/Counseling Consult Complete   [place] Not Applicable

## 2022-07-23 NOTE — TOC Initial Note (Signed)
Transition of Care [facility]) - Initial/Assessment Note    Patient Details  Name: Charles [name] MRN: [id number] Date of Birth: 05-Apr-1971  Transition of Care [facility]) CM/SW Contact:    Zenon Mayo, RN Phone Number: 07/23/2022, 4:32 PM  Clinical Narrative:                 Patient is homeless, he states he will be going to the [facility] at discharge.  TOC filled medications for him.  CSW gave patient clothes and bus pass to get to [facility].    Expected Discharge Plan: Homeless Shelter Barriers to Discharge: No Barriers Identified   Patient Goals and CMS Choice Patient states their goals for this hospitalization and ongoing recovery are:: return to [facility]   Choice offered to / list presented to : NA      Expected Discharge Plan and Services In-house Referral: NA Discharge Planning Services: CM Consult Post Acute Care Choice: NA Living arrangements for the past 2 months: Homeless Shelter Expected Discharge Date: 07/23/22               DME Arranged: N/A DME Agency: NA       HH Arranged: NA          Prior Living Arrangements/Services Living arrangements for the past 2 months: Homeless Shelter Lives with::  (homeless) Patient language and need for interpreter reviewed:: Yes Do you feel safe going back to the place where you live?: Yes      Need for Family Participation in Patient Care: No (Comment) Care giver support system in place?: No (comment)   Criminal Activity/Legal Involvement Pertinent to Current Situation/Hospitalization: No - Comment as needed  Activities of Daily Living Home Assistive Devices/Equipment: None ADL Screening (condition at time of admission) Patient's cognitive ability adequate to safely complete daily activities?: Yes Is the patient deaf or have difficulty hearing?: No Does the patient have difficulty seeing, even when wearing glasses/contacts?: No Does the patient have difficulty concentrating, remembering, or making decisions?: No Patient able to  express need for assistance with ADLs?: Yes Does the patient have difficulty dressing or bathing?: No Independently performs ADLs?: Yes (appropriate for developmental age) Does the patient have difficulty walking or climbing stairs?: No Weakness of Legs: None Weakness of Arms/Hands: None  Permission Sought/Granted                  Emotional Assessment   Attitude/Demeanor/Rapport: Engaged Affect (typically observed): Appropriate Orientation: : Oriented to Self, Oriented to Place, Oriented to  Time, Oriented to Situation Alcohol / Substance Use: Illicit Drugs Psych Involvement: No (comment)  Admission diagnosis:  SOB (shortness of breath) [R06.02] Acute exacerbation of CHF (congestive heart failure) ([place]) [I50.9] Patient Active Problem List   Diagnosis Date Noted   Essential hypertension 07/22/2022   History of hepatitis C 07/22/2022   Acute on chronic systolic CHF (congestive heart failure) ([place]) 02/28/2022   Elevated troponin 02/28/2022   CKD (chronic kidney disease), stage II 02/28/2022   Cocaine use disorder ([place]) 02/28/2022   Bacteremia 05/27/2021   Acute on chronic systolic and diastolic CHF (congestive heart failure) ([place]) 05/27/2021   Hyperglycemia 05/27/2021   Acute respiratory failure with hypoxia ([place]) 05/26/2021   Hypertensive urgency 05/26/2021   Polysubstance abuse ([place]) 05/26/2021   COPD with acute exacerbation ([place]) 05/26/2021   Ischemic chest pain ([place]) 05/26/2021   Hepatitis C 05/26/2021   Homelessness 05/26/2021   Encephalopathy acute 06/06/2020   Seizure ([place]) 06/06/2020   Aspiration pneumonia ([place]) 06/22/2019  Acute respiratory failure with hypoxia and hypercapnia (HCC) 06/22/2019   Hypokalemia 06/22/2019   Pneumonia due to COVID-19 virus 06/22/2019   Splenic laceration, initial encounter 08/02/2016   Thrombocytopenia ([place]) 10/19/2014   Tobacco use disorder 10/19/2014   AKI (acute kidney injury) ([place]) 10/19/2014   Bilateral pneumonia 10/17/2014    Multifocal pneumonia 10/17/2014   PCP:  Lucianne Lei, MD Pharmacy:   [facility] [place], [place] [address] Phone: [phone] Fax: [place], [employer] [place] [address] Phone: [phone] Fax: [phone]  [facility] [address] [address] Phone: [phone] Fax: [phone]     Social Determinants of Health (SDOH) Social History: SDOH Screenings   Food Insecurity: Unknown (07/21/2022)  Housing: Low Risk  (03/03/2022)  Transportation Needs: Unknown (07/21/2022)  Utilities: Unknown (07/21/2022)  Tobacco Use: High Risk (07/21/2022)   SDOH Interventions:     Readmission Risk Interventions    07/23/2022    4:29 PM 05/28/2021    2:11 PM  Readmission Risk Prevention Plan  Transportation Screening Complete Complete  PCP or Specialist Appt within 3-5 Days  Complete  HRI or [place]  Complete  Social Work Consult for [place] Planning/Counseling  Complete  Palliative Care Screening  Not Applicable  Medication Review Press photographer) Complete Referral to Pharmacy  PCP or Specialist appointment within 3-5 days of discharge Complete   HRI or [place] Complete   SW Recovery Care/Counseling Consult Complete   [place] Not Applicable
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
## 2022-07-25 ENCOUNTER — Ambulatory Visit: Payer: Self-pay | Admitting: Internal Medicine

## 2022-07-31 ENCOUNTER — Emergency Department (HOSPITAL_COMMUNITY)
Admission: EM | Admit: 2022-07-31 | Discharge: 2022-07-31 | Disposition: A | Discharge status: Home / Self Care | Payer: Medicare HMO | Attending: Emergency Medicine | Admitting: Emergency Medicine

## 2022-07-31 ENCOUNTER — Encounter (HOSPITAL_COMMUNITY): Payer: Self-pay | Admitting: Emergency Medicine

## 2022-07-31 ENCOUNTER — Emergency Department (HOSPITAL_COMMUNITY): Payer: Medicare HMO

## 2022-07-31 ENCOUNTER — Other Ambulatory Visit (HOSPITAL_COMMUNITY): Payer: Self-pay

## 2022-07-31 DIAGNOSIS — R0602 Shortness of breath: Secondary | ICD-10-CM | POA: Diagnosis not present

## 2022-07-31 DIAGNOSIS — I10 Essential (primary) hypertension: Secondary | ICD-10-CM | POA: Diagnosis not present

## 2022-07-31 DIAGNOSIS — Z1152 Encounter for screening for COVID-19: Secondary | ICD-10-CM | POA: Insufficient documentation

## 2022-07-31 DIAGNOSIS — R059 Cough, unspecified: Secondary | ICD-10-CM | POA: Diagnosis not present

## 2022-07-31 LAB — CBC WITH DIFFERENTIAL/PLATELET
Abs Immature Granulocytes: 0.01 10*3/uL (ref 0.00–0.07)
Basophils Absolute: 0 10*3/uL (ref 0.0–0.1)
Basophils Relative: 0 %
Eosinophils Absolute: 0 10*3/uL (ref 0.0–0.5)
Eosinophils Relative: 0 %
HCT: 43.5 % (ref 39.0–52.0)
Hemoglobin: 14.9 g/dL (ref 13.0–17.0)
Immature Granulocytes: 0 %
Lymphocytes Relative: 21 %
Lymphs Abs: 0.8 10*3/uL (ref 0.7–4.0)
MCH: 36.1 pg — ABNORMAL HIGH (ref 26.0–34.0)
MCHC: 34.3 g/dL (ref 30.0–36.0)
MCV: 105.3 fL — ABNORMAL HIGH (ref 80.0–100.0)
Monocytes Absolute: 0.5 10*3/uL (ref 0.1–1.0)
Monocytes Relative: 13 %
Neutro Abs: 2.3 10*3/uL (ref 1.7–7.7)
Neutrophils Relative %: 66 %
Platelets: 118 10*3/uL — ABNORMAL LOW (ref 150–400)
RBC: 4.13 MIL/uL — ABNORMAL LOW (ref 4.22–5.81)
RDW: 17.2 % — ABNORMAL HIGH (ref 11.5–15.5)
WBC: 3.5 10*3/uL — ABNORMAL LOW (ref 4.0–10.5)
nRBC: 0 % (ref 0.0–0.2)

## 2022-07-31 LAB — COMPREHENSIVE METABOLIC PANEL
ALT: 24 U/L (ref 0–44)
AST: 27 U/L (ref 15–41)
Albumin: 3 g/dL — ABNORMAL LOW (ref 3.5–5.0)
Alkaline Phosphatase: 69 U/L (ref 38–126)
Anion gap: 9 (ref 5–15)
BUN: 23 mg/dL — ABNORMAL HIGH (ref 6–20)
CO2: 23 mmol/L (ref 22–32)
Calcium: 8.6 mg/dL — ABNORMAL LOW (ref 8.9–10.3)
Chloride: 106 mmol/L (ref 98–111)
Creatinine, Ser: 1.56 mg/dL — ABNORMAL HIGH (ref 0.61–1.24)
GFR, Estimated: 53 mL/min — ABNORMAL LOW (ref >60)
Glucose, Bld: 105 mg/dL — ABNORMAL HIGH (ref 70–99)
Potassium: 4.1 mmol/L (ref 3.5–5.1)
Sodium: 138 mmol/L (ref 135–145)
Total Bilirubin: 0.9 mg/dL (ref 0.3–1.2)
Total Protein: 6.5 g/dL (ref 6.5–8.1)

## 2022-07-31 LAB — RESP PANEL BY RT-PCR (RSV, FLU A&B, COVID)  RVPGX2
Influenza A by PCR: NEGATIVE
Influenza B by PCR: NEGATIVE
Resp Syncytial Virus by PCR: NEGATIVE
SARS Coronavirus 2 by RT PCR: NEGATIVE

## 2022-07-31 LAB — BRAIN NATRIURETIC PEPTIDE: B Natriuretic Peptide: 1705.1 pg/mL — ABNORMAL HIGH (ref 0.0–100.0)

## 2022-07-31 MED ORDER — SPIRONOLACTONE 25 MG PO TABS
12.5000 mg | ORAL_TABLET | Freq: Every day | ORAL | 2 refills | Status: DC
  Filled 2022-07-31: qty 15, 30d supply, fill #0

## 2022-07-31 MED ORDER — CARVEDILOL 3.125 MG PO TABS
3.1250 mg | ORAL_TABLET | Freq: Two times a day (BID) | ORAL | 2 refills | Status: DC
  Filled 2022-07-31: qty 60, 30d supply, fill #0

## 2022-07-31 MED ORDER — LOSARTAN POTASSIUM 25 MG PO TABS
25.0000 mg | ORAL_TABLET | Freq: Every day | ORAL | 2 refills | Status: DC
  Filled 2022-07-31: qty 30, 30d supply, fill #0

## 2022-07-31 MED ORDER — ALBUTEROL SULFATE HFA 108 (90 BASE) MCG/ACT IN AERS
2.0000 | INHALATION_SPRAY | RESPIRATORY_TRACT | 2 refills | Status: DC | PRN
  Filled 2022-07-31: qty 6.7, 30d supply, fill #0

## 2022-07-31 MED ORDER — FUROSEMIDE 40 MG PO TABS
40.0000 mg | ORAL_TABLET | Freq: Every day | ORAL | 2 refills | Status: DC
  Filled 2022-07-31: qty 30, 30d supply, fill #0

## 2022-07-31 MED ORDER — EMPAGLIFLOZIN 10 MG PO TABS
10.0000 mg | ORAL_TABLET | Freq: Every day | ORAL | 2 refills | Status: DC
  Filled 2022-07-31: qty 30, 30d supply, fill #0

## 2022-07-31 MED ORDER — POTASSIUM CHLORIDE CRYS ER 10 MEQ PO TBCR
10.0000 meq | EXTENDED_RELEASE_TABLET | Freq: Every day | ORAL | 2 refills | Status: DC
  Filled 2022-07-31: qty 30, 30d supply, fill #0

## 2022-07-31 NOTE — Discharge Instructions (Signed)
As discussed, your evaluation today has been largely reassuring.  But, it is important that you monitor your condition carefully, and do not hesitate to return to the ED if you develop new, or concerning changes in your condition. ? ?Otherwise, please follow-up with your physician for appropriate ongoing care. ? ?

## 2022-07-31 NOTE — ED Provider Notes (Signed)
[facility] Provider Note   CSN: [id number] Arrival date & time: 07/31/22  1203     History  Chief Complaint  Patient presents with   Cough    Charles [name] is a 52 y.o. male.  HPI Presents with concern of ongoing cough.  Patient acknowledges homelessness, states that he has had difficulty obtaining his medication.  He notes that since evaluation earlier this month he has had ongoing dyspnea, cough.  Is unclear if he is taking any of his medication as directed. He does not report fever, fall, specific pain, though he does acknowledge generalized discomfort.    Home Medications Prior to Admission medications   Medication Sig Start Date End Date Taking? Authorizing Provider  albuterol (VENTOLIN HFA) 108 (90 Base) MCG/ACT inhaler Inhale 2 puffs into the lungs as needed for wheezing or shortness of breath. 07/31/22   Carmin Muskrat, MD  carvedilol (COREG) 3.125 MG tablet Take 1 tablet (3.125 mg total) by mouth 2 (two) times daily with a meal. 07/31/22   Carmin Muskrat, MD  empagliflozin (JARDIANCE) 10 MG TABS tablet Take 1 tablet (10 mg total) by mouth daily. 07/31/22   Carmin Muskrat, MD  furosemide (LASIX) 40 MG tablet Take 1 tablet (40 mg total) by mouth daily. 07/31/22   Carmin Muskrat, MD  losartan (COZAAR) 25 MG tablet Take 1 tablet (25 mg total) by mouth daily. 07/31/22 10/29/22  Carmin Muskrat, MD  potassium chloride (KLOR-CON M) 10 MEQ tablet Take 1 tablet (10 mEq total) by mouth daily. 07/31/22 10/29/22  Carmin Muskrat, MD  spironolactone (ALDACTONE) 25 MG tablet Take 1/2 tablet (12.5 mg total) by mouth daily. 07/31/22 10/29/22  Carmin Muskrat, MD      Allergies    Egg-derived products, Banana, and Pork-derived products    Review of Systems   Review of Systems  All other systems reviewed and are negative.   Physical Exam Updated Vital Signs BP (!) 139/106 (BP Location: Left Arm)   Pulse 94   Temp 97.8 F (36.6 C)    Resp 19   SpO2 96%  Physical Exam Vitals and nursing note reviewed.  Constitutional:      General: He is not in acute distress.    Appearance: He is well-developed.  HENT:     Head: Normocephalic and atraumatic.  Eyes:     Conjunctiva/sclera: Conjunctivae normal.  Cardiovascular:     Rate and Rhythm: Normal rate and regular rhythm.  Pulmonary:     Effort: Pulmonary effort is normal. No respiratory distress.     Breath sounds: No stridor.  Abdominal:     General: There is no distension.  Skin:    General: Skin is warm and dry.  Neurological:     Mental Status: He is alert and oriented to person, place, and time.     ED Results / Procedures / Treatments   Labs (all labs ordered are listed, but only abnormal results are displayed) Labs Reviewed  CBC WITH DIFFERENTIAL/PLATELET - Abnormal; Notable for the following components:      Result Value   WBC 3.5 (*)    RBC 4.13 (*)    MCV 105.3 (*)    MCH 36.1 (*)    RDW 17.2 (*)    Platelets 118 (*)    All other components within normal limits  BRAIN NATRIURETIC PEPTIDE - Abnormal; Notable for the following components:   B Natriuretic Peptide 1,705.1 (*)    All other components within  normal limits  COMPREHENSIVE METABOLIC PANEL - Abnormal; Notable for the following components:   Glucose, Bld 105 (*)    BUN 23 (*)    Creatinine, Ser 1.56 (*)    Calcium 8.6 (*)    Albumin 3.0 (*)    GFR, Estimated 53 (*)    All other components within normal limits  RESP PANEL BY RT-PCR (RSV, FLU A&B, COVID)  RVPGX2    EKG None  Radiology DG Chest 2 View  Result Date: 07/31/2022 CLINICAL DATA:  sob EXAM: CHEST - 2 VIEW COMPARISON:  07/21/2022 FINDINGS: Cardiac silhouette enlarged. No evidence of pneumothorax or pleural effusion. No evidence of pulmonary edema. There are thoracic degenerative changes. IMPRESSION: Enlarged cardiac silhouette. Electronically Signed   By: Sammie Bench M.D.   On: 07/31/2022 12:50     Procedures Procedures    Medications Ordered in ED Medications - No data to display  ED Course/ Medical Decision Making/ A&P                             Medical Decision Making Patient with a history of pneumonia, AKI, COPD, CHF presents with cough, dyspnea in the context of ongoing homelessness. Patient is overtly in no distress on exam, is afebrile, without tachypnea, or hypoxia which is somewhat reassuring, but differential for all of the aforementioned items considered.  X-ray labs monitoring started.  Amount and/or Complexity of Data Reviewed External Data Reviewed: notes. Labs: ordered. Decision-making details documented in ED Course. Radiology: ordered and independent interpretation performed. Decision-making details documented in ED Course.  Risk Prescription drug management. Decision regarding hospitalization.  Patient requests a sandwich.  3:20 PM  patient in no distress, awake alert.  Labs consistent with multiple prior studies. he is in no distress, has tolerated sandwich, without any change in his condition.  I discussed his case with her social work colleagues to facilitate obtaining medications, they will be provided the patient prior to discharge, but there is no evidence for acute phenomena, patient encouraged to use medication, follow-up at our [facility] center.         Final Clinical Impression(s) / ED Diagnoses Final diagnoses:  SOB (shortness of breath)    Rx / DC Orders ED Discharge Orders          Ordered    furosemide (LASIX) 40 MG tablet  Daily        07/31/22 1512    empagliflozin (JARDIANCE) 10 MG TABS tablet  Daily        07/31/22 1512    carvedilol (COREG) 3.125 MG tablet  2 times daily with meals        07/31/22 1512    albuterol (VENTOLIN HFA) 108 (90 Base) MCG/ACT inhaler  As needed        07/31/22 1512    losartan (COZAAR) 25 MG tablet  Daily        07/31/22 1512    potassium chloride (KLOR-CON M) 10 MEQ tablet   Daily        07/31/22 1512    spironolactone (ALDACTONE) 25 MG tablet  Daily        07/31/22 1512              Carmin Muskrat, MD 07/31/22 1521

## 2022-07-31 NOTE — Discharge Planning (Signed)
RNCM consulted regarding medication assistance of pt unable to afford medications.  RNCM utilized [facility] petty cash to cover co-pay and filled through [facility].  Rx e-scribed to [facility] and to be delivered to pt prior to discharge home.  No further RNCM needs identified at this time.

## 2022-07-31 NOTE — ED Triage Notes (Signed)
[place] [facility] walked up to them stating that he has a productive cough for the last few days

## 2022-08-01 ENCOUNTER — Ambulatory Visit: Payer: Medicaid Other | Admitting: Internal Medicine
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
## 2022-08-07 ENCOUNTER — Telehealth: Payer: Self-pay

## 2022-08-07 NOTE — Telephone Encounter (Signed)
     Patient  visit on 3/20  at [facility]    Have you been able to follow up with your primary care physician? Yes   The patient was or was not able to obtain any needed medicine or equipment. Yes   Are there diet recommendations that you are having difficulty following? Na   Patient expresses understanding of discharge instructions and education provided has no other needs at this time.  Yes      [name] [phone] [address], [place], [place] [address] Phone: [phone] Email: [email or web address]

## 2022-08-10 ENCOUNTER — Inpatient Hospital Stay (HOSPITAL_COMMUNITY)
Admission: EM | Admit: 2022-08-10 | Discharge: 2022-08-14 | DRG: 291 | Disposition: A | Discharge status: Home / Self Care | Payer: Medicare HMO | Attending: Infectious Diseases | Admitting: Infectious Diseases

## 2022-08-10 ENCOUNTER — Emergency Department (HOSPITAL_COMMUNITY): Payer: Medicare HMO

## 2022-08-10 ENCOUNTER — Other Ambulatory Visit: Payer: Self-pay

## 2022-08-10 DIAGNOSIS — Z91012 Allergy to eggs: Secondary | ICD-10-CM

## 2022-08-10 DIAGNOSIS — G8929 Other chronic pain: Secondary | ICD-10-CM | POA: Insufficient documentation

## 2022-08-10 DIAGNOSIS — J449 Chronic obstructive pulmonary disease, unspecified: Secondary | ICD-10-CM | POA: Diagnosis not present

## 2022-08-10 DIAGNOSIS — Z7984 Long term (current) use of oral hypoglycemic drugs: Secondary | ICD-10-CM | POA: Diagnosis not present

## 2022-08-10 DIAGNOSIS — M25561 Pain in right knee: Secondary | ICD-10-CM | POA: Diagnosis present

## 2022-08-10 DIAGNOSIS — E871 Hypo-osmolality and hyponatremia: Secondary | ICD-10-CM | POA: Diagnosis not present

## 2022-08-10 DIAGNOSIS — I5023 Acute on chronic systolic (congestive) heart failure: Secondary | ICD-10-CM | POA: Diagnosis present

## 2022-08-10 DIAGNOSIS — D61818 Other pancytopenia: Secondary | ICD-10-CM | POA: Diagnosis present

## 2022-08-10 DIAGNOSIS — F17210 Nicotine dependence, cigarettes, uncomplicated: Secondary | ICD-10-CM | POA: Diagnosis not present

## 2022-08-10 DIAGNOSIS — Z91018 Allergy to other foods: Secondary | ICD-10-CM

## 2022-08-10 DIAGNOSIS — Z79899 Other long term (current) drug therapy: Secondary | ICD-10-CM

## 2022-08-10 DIAGNOSIS — Z800 Family history of malignant neoplasm of digestive organs: Secondary | ICD-10-CM | POA: Diagnosis not present

## 2022-08-10 DIAGNOSIS — I509 Heart failure, unspecified: Secondary | ICD-10-CM

## 2022-08-10 DIAGNOSIS — I110 Hypertensive heart disease with heart failure: Secondary | ICD-10-CM | POA: Diagnosis not present

## 2022-08-10 DIAGNOSIS — M25572 Pain in left ankle and joints of left foot: Secondary | ICD-10-CM | POA: Diagnosis present

## 2022-08-10 DIAGNOSIS — F1410 Cocaine abuse, uncomplicated: Secondary | ICD-10-CM | POA: Diagnosis not present

## 2022-08-10 DIAGNOSIS — Z5900 Homelessness unspecified: Secondary | ICD-10-CM | POA: Diagnosis not present

## 2022-08-10 DIAGNOSIS — Z91148 Patient's other noncompliance with medication regimen for other reason: Secondary | ICD-10-CM

## 2022-08-10 DIAGNOSIS — R0602 Shortness of breath: Secondary | ICD-10-CM | POA: Diagnosis not present

## 2022-08-10 DIAGNOSIS — E875 Hyperkalemia: Secondary | ICD-10-CM | POA: Diagnosis present

## 2022-08-10 DIAGNOSIS — Z801 Family history of malignant neoplasm of trachea, bronchus and lung: Secondary | ICD-10-CM

## 2022-08-10 DIAGNOSIS — F17290 Nicotine dependence, other tobacco product, uncomplicated: Secondary | ICD-10-CM | POA: Diagnosis present

## 2022-08-10 DIAGNOSIS — Z91014 Allergy to mammalian meats: Secondary | ICD-10-CM | POA: Diagnosis not present

## 2022-08-10 DIAGNOSIS — I428 Other cardiomyopathies: Secondary | ICD-10-CM | POA: Diagnosis not present

## 2022-08-10 DIAGNOSIS — Z23 Encounter for immunization: Secondary | ICD-10-CM | POA: Diagnosis not present

## 2022-08-10 DIAGNOSIS — B182 Chronic viral hepatitis C: Secondary | ICD-10-CM | POA: Diagnosis present

## 2022-08-10 DIAGNOSIS — G4733 Obstructive sleep apnea (adult) (pediatric): Secondary | ICD-10-CM | POA: Diagnosis present

## 2022-08-10 DIAGNOSIS — Z1152 Encounter for screening for COVID-19: Secondary | ICD-10-CM | POA: Diagnosis not present

## 2022-08-10 DIAGNOSIS — K4090 Unilateral inguinal hernia, without obstruction or gangrene, not specified as recurrent: Secondary | ICD-10-CM | POA: Diagnosis present

## 2022-08-10 DIAGNOSIS — R059 Cough, unspecified: Secondary | ICD-10-CM | POA: Diagnosis not present

## 2022-08-10 LAB — CBC WITH DIFFERENTIAL/PLATELET
Abs Immature Granulocytes: 0.01 10*3/uL (ref 0.00–0.07)
Basophils Absolute: 0 10*3/uL (ref 0.0–0.1)
Basophils Relative: 0 %
Eosinophils Absolute: 0 10*3/uL (ref 0.0–0.5)
Eosinophils Relative: 0 %
HCT: 38.3 % — ABNORMAL LOW (ref 39.0–52.0)
Hemoglobin: 13.1 g/dL (ref 13.0–17.0)
Immature Granulocytes: 0 %
Lymphocytes Relative: 20 %
Lymphs Abs: 0.7 10*3/uL (ref 0.7–4.0)
MCH: 35.3 pg — ABNORMAL HIGH (ref 26.0–34.0)
MCHC: 34.2 g/dL (ref 30.0–36.0)
MCV: 103.2 fL — ABNORMAL HIGH (ref 80.0–100.0)
Monocytes Absolute: 0.5 10*3/uL (ref 0.1–1.0)
Monocytes Relative: 13 %
Neutro Abs: 2.5 10*3/uL (ref 1.7–7.7)
Neutrophils Relative %: 67 %
Platelets: 105 10*3/uL — ABNORMAL LOW (ref 150–400)
RBC: 3.71 MIL/uL — ABNORMAL LOW (ref 4.22–5.81)
RDW: 17.2 % — ABNORMAL HIGH (ref 11.5–15.5)
WBC: 3.8 10*3/uL — ABNORMAL LOW (ref 4.0–10.5)
nRBC: 0 % (ref 0.0–0.2)

## 2022-08-10 LAB — RESP PANEL BY RT-PCR (RSV, FLU A&B, COVID)  RVPGX2
Influenza A by PCR: NEGATIVE
Influenza B by PCR: NEGATIVE
Resp Syncytial Virus by PCR: NEGATIVE
SARS Coronavirus 2 by RT PCR: NEGATIVE

## 2022-08-10 LAB — BASIC METABOLIC PANEL
Anion gap: 9 (ref 5–15)
BUN: 15 mg/dL (ref 6–20)
CO2: 23 mmol/L (ref 22–32)
Calcium: 8.7 mg/dL — ABNORMAL LOW (ref 8.9–10.3)
Chloride: 105 mmol/L (ref 98–111)
Creatinine, Ser: 1.3 mg/dL — ABNORMAL HIGH (ref 0.61–1.24)
GFR, Estimated: >60 mL/min (ref >60)
Glucose, Bld: 93 mg/dL (ref 70–99)
Potassium: 4.2 mmol/L (ref 3.5–5.1)
Sodium: 137 mmol/L (ref 135–145)

## 2022-08-10 LAB — TROPONIN I (HIGH SENSITIVITY)
Troponin I (High Sensitivity): 70 ng/L — ABNORMAL HIGH (ref <18)
Troponin I (High Sensitivity): 70 ng/L — ABNORMAL HIGH (ref <18)

## 2022-08-10 LAB — D-DIMER, QUANTITATIVE: D-Dimer, Quant: 0.42 ug/mL-FEU (ref 0.00–0.50)

## 2022-08-10 LAB — BRAIN NATRIURETIC PEPTIDE: B Natriuretic Peptide: 2444.7 pg/mL — ABNORMAL HIGH (ref 0.0–100.0)

## 2022-08-10 MED ORDER — LIDOCAINE 5 % EX PTCH
1.0000 | MEDICATED_PATCH | CUTANEOUS | Status: DC
  Administered 2022-08-11 – 2022-08-13 (×3): 1 via TRANSDERMAL
  Filled 2022-08-10 (×4): qty 1

## 2022-08-10 MED ORDER — SPIRONOLACTONE 12.5 MG HALF TABLET
12.5000 mg | ORAL_TABLET | Freq: Every day | ORAL | Status: DC
  Administered 2022-08-10 – 2022-08-12 (×3): 12.5 mg via ORAL
  Filled 2022-08-10 (×3): qty 1

## 2022-08-10 MED ORDER — FUROSEMIDE 10 MG/ML IJ SOLN
40.0000 mg | Freq: Two times a day (BID) | INTRAMUSCULAR | Status: DC
  Administered 2022-08-10 – 2022-08-12 (×4): 40 mg via INTRAVENOUS
  Filled 2022-08-10 (×4): qty 4

## 2022-08-10 MED ORDER — LOSARTAN POTASSIUM 25 MG PO TABS
25.0000 mg | ORAL_TABLET | Freq: Every day | ORAL | Status: DC
  Administered 2022-08-10 – 2022-08-14 (×5): 25 mg via ORAL
  Filled 2022-08-10 (×5): qty 1

## 2022-08-10 MED ORDER — IPRATROPIUM-ALBUTEROL 0.5-2.5 (3) MG/3ML IN SOLN
3.0000 mL | Freq: Once | RESPIRATORY_TRACT | Status: AC
  Administered 2022-08-10: 3 mL via RESPIRATORY_TRACT
  Filled 2022-08-10: qty 3

## 2022-08-10 MED ORDER — EMPAGLIFLOZIN 10 MG PO TABS
10.0000 mg | ORAL_TABLET | Freq: Every day | ORAL | Status: DC
  Administered 2022-08-11 – 2022-08-14 (×4): 10 mg via ORAL
  Filled 2022-08-10 (×4): qty 1

## 2022-08-10 MED ORDER — RIVAROXABAN 10 MG PO TABS
10.0000 mg | ORAL_TABLET | Freq: Every day | ORAL | Status: DC
  Administered 2022-08-10 – 2022-08-14 (×5): 10 mg via ORAL
  Filled 2022-08-10 (×5): qty 1

## 2022-08-10 MED ORDER — CARVEDILOL 3.125 MG PO TABS
3.1250 mg | ORAL_TABLET | Freq: Two times a day (BID) | ORAL | Status: DC
  Administered 2022-08-11 – 2022-08-14 (×7): 3.125 mg via ORAL
  Filled 2022-08-10 (×7): qty 1

## 2022-08-10 MED ORDER — NICOTINE 7 MG/24HR TD PT24
7.0000 mg | MEDICATED_PATCH | Freq: Every day | TRANSDERMAL | Status: DC
  Administered 2022-08-10 – 2022-08-14 (×5): 7 mg via TRANSDERMAL
  Filled 2022-08-10 (×5): qty 1

## 2022-08-10 NOTE — ED Triage Notes (Signed)
Pt bib ems, homeless, c/o chronic shortness of breath, cough, congestion, has inhaler but "inhaler not working", non compliant to other meds. Per EMS, pt doesn't want to be asked questions and that "he just needed a ride to the ER".  220/110--> 176/112 HR 85 96% RA

## 2022-08-10 NOTE — Hospital Course (Addendum)
F.U -hepatitis c -hernia   Acute decompensated systolic heart failure, NYHA III/ IV (EF 30-35%) History of nonischemic cardiomyopathy likely secondary to substance use and hypertension. He has not had a catheterization or coronary CT completed. cMRI 1/23 with no signs of infiltrative disease. Echo completed at prior admission this month with EF 30 to 35% with global hypokinesis of left ventricle. CXR showed some pulmonary congestion with possible developing opacity in right base and left lower lobe. He has been afebrile with non productive cough. Will continue to monitor, but CXR more concerning for fluid overload than infection at this time.   Mr. Ostaszewski is nonadherent to medications as they make him feel unwell.  He was unable to establish with a PCP at last discharge.  GDMT includes carvedilol, losartan, empagliflozin, and lasix.  He has worsened over the last week to where he cannot tolerate laying even with head of bed elevated.  Over the last week he has also had worsening central chest pressure and diaphoresis.  He is hemodynamically stable and not requiring supplemental oxygen.  Physical exam consistent with hypervolemia, extremities are warm and I do not suspect low output state.  BNP acutely elevated at 2200.  EKG without ischemic changes, troponins flat at 70 consistent with hF exacerbation, do not suspect ACS. Dry weight documented as 70-72kg, unsure of accuracy of current weight documented at 68kg.   He continues to use cocaine daily, my suspicion is heart failure exacerbation secondary to continued use and hypertension. -IV Lasix 40 mg twice daily -Restart GDMT of carvedilol 3.125 mg BID, dapagliflozin , losartan 25 mg and spironolactone 12.5 mg daily -replete electrolytes for K > 4 and Mg>2 -daily weights and strict I and O   Aplastic anemia Notes history of aplastic anemia, per chart review seems to have had bone marrow aspirations in the 1980's . Hgb stable at 13.1 -CBC   Chronic  hepatitis C Thrombocytopenia Hepatitis C RNA in 6 million at the last admission.  He needs to follow-up with ID to have this treated. -CBC -INR   COPD No documented PFTs. No wheezing on exam, no productive sputum.  -O2 saturation >88%   HTN Initial blood pressure readings in 200s over 100s. Losartan and spironolactone restarted   OSA No sleep study on file. Does not use CPAP  -CPAP qhs   Reducible inguinal hernia Concerns for incarceration or strangulation on exam.  Easily reduced. -outpatient follow-up   TOC Polysubstance use He seems to have an understanding that cocaine use is leading to heart failure.  He does not feel like he is able to stop using cocaine as he has a lot of pain in his body.  Called and talked with his niece who is his POA.
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
## 2022-08-10 NOTE — ED Notes (Signed)
Pt given [place] sandwiches

## 2022-08-10 NOTE — Progress Notes (Signed)
RT note. Patient placed on auto cpap 20/5 with stable VS, sat 100% on rm air. No labored breathing noted at this time. RT will continue to monitor.    08/10/22 1630  BiPAP/CPAP/SIPAP  $ Non-Invasive Ventilator  Non-Invasive Vent Set Up;Non-Invasive Vent Initial  $ Face Mask Medium Yes  BiPAP/CPAP/SIPAP Pt Type Adult  BiPAP/CPAP/SIPAP DREAMSTATIOND  Mask Type Full face mask  Mask Size Medium  Respiratory Rate 18 breaths/min  IPAP  (Patient placed on auto cpap 20/5)  FiO2 (%) 21 %  Patient Home Equipment No  Auto Titrate (S)  Yes  BiPAP/CPAP /SiPAP Vitals  Pulse Rate 97  Resp 18  SpO2 100 %  MEWS Score/Color  MEWS Score 0  MEWS Score Color Nyoka Cowden

## 2022-08-10 NOTE — ED Provider Notes (Signed)
[facility] Provider Note   CSN: [id number] Arrival date & time: 08/10/22  1101     History  Chief Complaint  Patient presents with   Shortness of [place] Jr Nold is a 52 y.o. male history of homelessness, CHF, AKI, pneumonia, substance use, thrombocytopenia presented for shortness of breath that has been present for several months.  Patient been seen in the past and has had negative labs and PE studies however patient was recently discharged 18 days ago.  Patient states he does not take his medications outside of the ER as he is homeless and does not have a schedule and therefore cannot take his medication.  Patient has been prescribed Lasix, spironolactone, Cozaar, carvedilol, albuterol. patient states he is short of breath at rest without any trigger.  Patient states he feels that his hands are swollen but is able to move them along with full sensation and no skin discolorations.  Patient denied fevers/chills, chest pain, abdominal pain, nausea/vomiting, headache, vision changes, leg swelling, hematochezia, hemoptysis, recent travel  Home Medications Prior to Admission medications   Medication Sig Start Date End Date Taking? Authorizing Provider  albuterol (VENTOLIN HFA) 108 (90 Base) MCG/ACT inhaler Inhale 2 puffs into the lungs as needed for wheezing or shortness of breath. 07/31/22   Carmin Muskrat, MD  carvedilol (COREG) 3.125 MG tablet Take 1 tablet (3.125 mg total) by mouth 2 (two) times daily with a meal. 07/31/22   Carmin Muskrat, MD  empagliflozin (JARDIANCE) 10 MG TABS tablet Take 1 tablet (10 mg total) by mouth daily. 07/31/22   Carmin Muskrat, MD  furosemide (LASIX) 40 MG tablet Take 1 tablet (40 mg total) by mouth daily. 07/31/22   Carmin Muskrat, MD  losartan (COZAAR) 25 MG tablet Take 1 tablet (25 mg total) by mouth daily. 07/31/22 10/29/22  Carmin Muskrat, MD  potassium chloride (KLOR-CON M) 10 MEQ tablet Take 1  tablet (10 mEq total) by mouth daily. 07/31/22 10/29/22  Carmin Muskrat, MD  spironolactone (ALDACTONE) 25 MG tablet Take 1/2 tablet (12.5 mg total) by mouth daily. 07/31/22 10/29/22  Carmin Muskrat, MD      Allergies    Egg-derived products, Banana, and Pork-derived products    Review of Systems   Review of Systems  Respiratory:  Positive for shortness of breath.   See HPI  Physical Exam Updated Vital Signs BP (!) 186/152   Pulse 99   Temp 98.1 F (36.7 C)   Resp 17   Ht 5\' 6"  (1.676 m)   Wt 68.5 kg   SpO2 98%   BMI 24.37 kg/m  Physical Exam Vitals reviewed.  Constitutional:      General: He is not in acute distress. HENT:     Head: Normocephalic and atraumatic.  Eyes:     Extraocular Movements: Extraocular movements intact.     Conjunctiva/sclera: Conjunctivae normal.     Pupils: Pupils are equal, round, and reactive to light.  Cardiovascular:     Rate and Rhythm: Normal rate and regular rhythm.     Pulses: Normal pulses.     Heart sounds: Normal heart sounds.     Comments: 2+ bilateral radial/dorsalis pedis pulses with regular rate Pulmonary:     Effort: No respiratory distress.     Breath sounds: Normal breath sounds. No wheezing or rhonchi.     Comments: Tachypnea Abdominal:     Palpations: Abdomen is soft.     Tenderness: There is no  abdominal tenderness. There is no guarding or rebound.  Musculoskeletal:        General: Normal range of motion.     Cervical back: Normal range of motion and neck supple.     Comments: 5 out of 5 bilateral grip/leg extension strength  Skin:    General: Skin is warm and dry.     Capillary Refill: Capillary refill takes less than 2 seconds.  Neurological:     General: No focal deficit present.     Mental Status: He is alert and oriented to person, place, and time.     Comments: Sensation intact in all 4 limbs  Psychiatric:        Mood and Affect: Mood normal.     ED Results / Procedures / Treatments   Labs (all labs  ordered are listed, but only abnormal results are displayed) Labs Reviewed  BASIC METABOLIC PANEL - Abnormal; Notable for the following components:      Result Value   Creatinine, Ser 1.30 (*)    Calcium 8.7 (*)    All other components within normal limits  CBC WITH DIFFERENTIAL/PLATELET - Abnormal; Notable for the following components:   WBC 3.8 (*)    RBC 3.71 (*)    HCT 38.3 (*)    MCV 103.2 (*)    MCH 35.3 (*)    RDW 17.2 (*)    Platelets 105 (*)    All other components within normal limits  BRAIN NATRIURETIC PEPTIDE - Abnormal; Notable for the following components:   B Natriuretic Peptide 2,444.7 (*)    All other components within normal limits  TROPONIN I (HIGH SENSITIVITY) - Abnormal; Notable for the following components:   Troponin I (High Sensitivity) 70 (*)    All other components within normal limits  RESP PANEL BY RT-PCR (RSV, FLU A&B, COVID)  RVPGX2  D-DIMER, QUANTITATIVE  TROPONIN I (HIGH SENSITIVITY)    EKG EKG Interpretation  Date/Time:  Saturday August 10 2022 11:12:24 EDT Ventricular Rate:  88 PR Interval:  165 QRS Duration: 70 QT Interval:  403 QTC Calculation: 488 R Axis:   60 Text Interpretation: Sinus rhythm Probable left atrial enlargement LVH with secondary repolarization abnormality Anterior ST elevation, probably due to LVH Borderline prolonged QT interval Confirmed by Godfrey Pick ([id number]) on 08/10/2022 11:37:05 AM  Radiology DG Chest 2 View  Result Date: 08/10/2022 CLINICAL DATA:  Shortness of breath.  Cough. EXAM: CHEST - 2 VIEW COMPARISON:  07/31/2022 FINDINGS: AP and lateral views. Midline trachea. Mild cardiomegaly. No pleural effusion or pneumothorax. Mild pulmonary interstitial prominence. Vague increased density at the right lung base. Possible retrocardiac left lower lobe opacity. IMPRESSION: Vague increased right base density is suspicious for airspace disease/pneumonia. Possible developing retrocardiac left lower lobe opacity as well. If the  patient can undergo PA radiograph, to allow direct comparison 07/31/22, this would be useful. Cardiomegaly with mild interstitial prominence. This could be technique related or represent pulmonary venous congestion. Electronically Signed   By: Abigail Miyamoto M.D.   On: 08/10/2022 12:08    Procedures .Critical Care  Performed by: Chuck Hint, PA-C Authorized by: Chuck Hint, PA-C   Critical care provider statement:    Critical care time (minutes):  30   Critical care time was exclusive of:  Separately billable procedures and treating other patients   Critical care was necessary to treat or prevent imminent or life-threatening deterioration of the following conditions:  Respiratory failure   Critical care was time spent personally  by me on the following activities:  Ordering and performing treatments and interventions, ordering and review of laboratory studies, pulse oximetry, re-evaluation of patient's condition, ordering and review of radiographic studies, review of old charts, obtaining history from patient or surrogate, examination of patient, evaluation of patient's response to treatment, development of treatment plan with patient or surrogate and discussions with consultants   I assumed direction of critical care for this patient from another provider in my specialty: no     Care discussed with: admitting provider       Medications Ordered in ED Medications  ipratropium-albuterol (DUONEB) 0.5-2.5 (3) MG/3ML nebulizer solution 3 mL (3 mLs Nebulization Given 08/10/22 1136)    ED Course/ Medical Decision Making/ A&P                             Medical Decision Making  Duanne Yale 52 y.o. presented today for shortness of breath. Working DDx that I considered at this time includes, but not limited to, asthma/COPD exacerbation, pneumonia, anemia, CHF exacerbation, pleural effusions, PE, ACS.  R/o DDx: ACS, PE, COPD/asthma exacerbation, pneumonia, and new anemia: These are  considered less likely due to history of present illness and physical exam findings  Review of prior external notes: 07/31/2022 ED provider  Unique Tests and My Interpretation:  CBC: Pancytopenia however this is similar to previous results Troponin: 70: BMP: Unremarkable BNP: 2444.7 Respiratory panel: Negative EKG: Sinus 88 bpm, prolonged prolonged QT, no new ST antibiotics or blocks noted; similar to previous EKG Chest x-ray: Possible opacities noted in right base and left lower lobe. D-dimer: Negative  Discussion with Independent Historian: None  Discussion of Management of Tests: Masters, IM PGY-2  Risk: High:  - hospitalization or escalation of hospital-level care  Risk Stratification Score: none  Plan: Patient presented for breath. On exam patient was in no acute distress however was tachypneic to 22 breaths/min.  On exam I did not note any adventitious lung sounds however due to patient's increased work of breathing a DuoNeb was ordered along with labs and imaging.  Patient did not endorse any red flag signs of a PE however patient was recently hospitalized and so a D-dimer will be ordered to evaluate if a CT angio is needed at this time..  Patient stable at this time.  X-ray came back positive for possible pneumonia which could explain patient's shortness of breath.  On recheck patient was in no acute distress and had stable vitals.  However patient noted that after the DuoNeb treatment he was so short of breath.  His respirations were 26 in the room however in the chart it is noted at 43 and patient was in no distress.  I spoke with the patient about his recent BNP results and how he is most likely having a CHF exacerbation due to not being able to take his Lasix and spoke with him about the possibility admission in which patient agreed to if he needed to be admitted.  Currently awaiting D-dimer to rule out PE but patient will be admitted for CHF exacerbation at this time once more  labs returned.  Patient's troponin was 70.  This is lower than his previous readings of 92 to I suspect this is most likely due to demand ischemia from chronic shortness of breath.  D-dimer negative.  Internal medicine will be consulted for admission.  Internal medicine accepted patient for admission.  Patient stable for admission at this  time.        Final Clinical Impression(s) / ED Diagnoses Final diagnoses:  Acute on chronic congestive heart failure, unspecified heart failure type [facility])    Rx / DC Orders ED Discharge Orders     None         Elvina Sidle 08/10/22 1358    Godfrey Pick, MD 08/14/22 1610

## 2022-08-10 NOTE — Progress Notes (Signed)
CPAP at bedside, pt states he will call later if he wants to wear it.

## 2022-08-10 NOTE — H&P (Signed)
Date: 08/10/2022               Patient Name:  Charles [name] MRN: [id number]  DOB: 05-13-1971 Age / Sex: 52 y.o., male   PCP: No PCP         Medical Service: Internal Medicine Teaching Service         Attending Physician: Dr. Lucious Groves, DO      First Contact: Dr. Nani Gasser, MD Pager [phone]    Second Contact: Dr. Christiana Fuchs, DO Pager [phone]         After Hours (After 5p/  First Contact Pager: [phone]  weekends / holidays): Second Contact Pager: [phone]   SUBJECTIVE  Chief Complaint: shortness of breath  History of Present Illness: Charles [name] is a 52 y.o. male who is currently unhomed with a pertinent PMH of systolic heart failure (EF 30-35%), non-ischemic cardiomyopathy, hypertension, aplastic anemia, OSA, polysubstance use, chronic hepatitis C, who presents to [facility] with shortness of breath.  He was recently admitted for acute on chronic systolic heart failure exacerbation and was discharged 3/12. He was not able to follow-up with [facility] and returned to ED 3/20 with worsening shortness of breath. Work up showed BNP at 1700 and he was given additional medications at that time and discharged. In the last week, his shortness of breath has worsened to the point that he feels short of breath at rest and with bed at 45 degree angle. He has not been taking medications for at least last 2 days because they make him feel unwell. He has also noted some non-radiating, central chest pain that worsens with deep breaths. He has some back pain that he thinks is from sleeping on the ground.  Endorses orthopnea, swelling in hands and feet. He is concerned about inguinal hernia on left side that has been present for several months. Denies fever, chills, nausea, vomiting, and diarrhea.   Past Medical History:  CHF HTN Polysubstance use Inguinal hernia Aplastic anemia Hx of left ankle fracture  Medications: Albuterol inhaler Carvedilol 3.125 mg BID Furosemide 40 mg  qd Jardiance 10 mg Losartan 25 qd Kclor 10 meq qd Spironolactone 12.5mg   Social:  Lives - unhomed, goes to [facility] but feels overwhelmed if to many people are there Support - niece is in [place] and is POA, family wants to help but frustrated with knowing best way to do so Level of function - independent in ADLs PCP - does not see Charles [name], missed appt with [facility] but wants to establish care Substance use - daily cocaine use, occasionally smokes cigars, smoked cigaretes since 52 y/o 1PPD, rarely drinks a beer, denies opioid use or injection drug use  Family History: Family History  Problem Relation Age of Onset   Lung cancer Mother    Colon cancer Father     Allergies: Allergies as of 08/10/2022 - Review Complete 08/10/2022  Allergen Reaction Noted   Egg-derived products Nausea And Vomiting 10/17/2014   Banana Nausea And Vomiting 03/10/2015   Pork-derived products Nausea And Vomiting 03/10/2015    Review of Systems: A complete ROS was negative except as per HPI.   OBJECTIVE:  Physical Exam: Blood pressure (!) 155/117, pulse 97, temperature 97.6 F (36.4 C), temperature source Oral, resp. rate 18, height 5\' 6"  (1.676 m), weight 68.5 kg, SpO2 100 %. Constitutional: in no acute distress Cardiovascular: regular rate and rhythm, no m/r/g Pulmonary/Chest: normal work breathing on room air, crackles present to mid-lung bilaterally Abdominal:  nontender, soft, no distended, Reducible, non-tender, non-erythematous left inguinal hernia MSK: no lower extremity edema present Neurological: alert and oriented x3 Skin: warm and dry  Pertinent Labs: CBC BNP 2,444 Creatinine 1.3 with baseline 1.4-1.5  Troponin 70 to 70 on repeat  WBC 3.8 Platelets 105 RVP negative  Pertinent Imaging: DG Chest 2 View  Result Date: 08/10/2022 CLINICAL DATA:  Shortness of breath.  Cough. EXAM: CHEST - 2 VIEW COMPARISON:  07/31/2022 FINDINGS: AP and lateral views. Midline trachea. Mild cardiomegaly. No  pleural effusion or pneumothorax. Mild pulmonary interstitial prominence. Vague increased density at the right lung base. Possible retrocardiac left lower lobe opacity. IMPRESSION: Vague increased right base density is suspicious for airspace disease/pneumonia. Possible developing retrocardiac left lower lobe opacity as well. If the patient can undergo PA radiograph, to allow direct comparison 07/31/22, this would be useful. Cardiomegaly with mild interstitial prominence. This could be technique related or represent pulmonary venous congestion. Electronically Signed   By: Charles [name] M.D.   On: 08/10/2022 12:08    EKG: personally reviewed my interpretation is sinus rhythm with prolonged Qtc at 488, J point elevation at leads V3 and V4, no ischemic changes  ASSESSMENT & PLAN:  Assessment: Active Problems:   Acute exacerbation of CHF (congestive heart failure) ([place])   Charles [name] is a 52 y.o. with pertinent PMH of HfrEF (EF 30-35%), polysubstance use disorder, left inguinal hernia who presented with worsening dyspnea and admit for acute on chronic heart failure exacerbation on hospital day 0  Plan:  Acute decompensated systolic heart failure, NYHA III/ IV (EF 30-35%) History of nonischemic cardiomyopathy likely secondary to substance use and hypertension. He has not had a catheterization or coronary CT completed. cMRI 1/23 with no signs of infiltrative disease. Echo completed at prior admission this month with EF 30 to 35% with global hypokinesis of left ventricle. CXR showed some pulmonary congestion with possible developing opacity in right base and left lower lobe. He has been afebrile with non productive cough. Will continue to monitor, but CXR more concerning for fluid overload than infection at this time.  [name] is nonadherent to medications as they make him feel unwell.  He was unable to establish with a PCP at last discharge.  GDMT includes carvedilol, losartan, empagliflozin, and  lasix.  He has worsened over the last week to where he cannot tolerate laying even with head of bed elevated.  Over the last week he has also had worsening central chest pressure and diaphoresis.  He is hemodynamically stable and not requiring supplemental oxygen.  Physical exam consistent with hypervolemia, extremities are warm and I do not suspect low output state.  BNP acutely elevated at 2200.  EKG without ischemic changes, troponins flat at 70 consistent with hF exacerbation, do not suspect ACS. Dry weight documented as 70-72kg, unsure of accuracy of current weight documented at 68kg.  He continues to use cocaine daily, my suspicion is heart failure exacerbation secondary to continued use and hypertension. -IV Lasix 40 mg twice daily -Restart GDMT of carvedilol 3.125 mg BID, dapagliflozin , losartan 25 mg and spironolactone 12.5 mg daily -replete electrolytes for K > 4 and Mg>2 -daily weights and strict I and O  Aplastic anemia Notes history of aplastic anemia, per chart review seems to have had bone marrow aspirations in the 1980's . Hgb stable at 13.1 -CBC  Chronic hepatitis C Thrombocytopenia Hepatitis C RNA in 6 million at the last admission.  He needs to follow-up with ID  to have this treated. -CBC -INR  COPD No documented PFTs. No wheezing on exam, no productive sputum.  -O2 saturation >88%  HTN Initial blood pressure readings in 200s over 100s. Losartan and spironolactone restarted  OSA No sleep study on file. Does not use CPAP  -CPAP qhs  Reducible inguinal hernia Concerns for incarceration or strangulation on exam.  Easily reduced. -outpatient follow-up  TOC Polysubstance use He seems to have an understanding that cocaine use is leading to heart failure.  He does not feel like he is able to stop using cocaine as he has a lot of pain in his body.  Called and talked with his niece who is his POA.  Best Practice: Diet: Na, restricted IVF: none VTE: rivaroxaban  (XARELTO) tablet 10 mg Start: 08/10/22 1415 Code: Full AB: none Status: Inpatient with expected length of stay greater than 2 midnights. Anticipated Discharge Location: unhomed Barriers to Discharge: diuresis   Signature: Daleen Bo. Alethea Terhaar, D.O.  Internal Medicine Resident, PGY-2 [facility]  Pager: [phone] 6:35 PM, 08/10/2022   Please contact the on call pager after 5 pm and on weekends at [phone].

## 2022-08-11 ENCOUNTER — Encounter (HOSPITAL_COMMUNITY): Payer: Self-pay | Admitting: Internal Medicine

## 2022-08-11 DIAGNOSIS — I509 Heart failure, unspecified: Secondary | ICD-10-CM | POA: Diagnosis not present

## 2022-08-11 DIAGNOSIS — F17210 Nicotine dependence, cigarettes, uncomplicated: Secondary | ICD-10-CM | POA: Diagnosis not present

## 2022-08-11 LAB — CBC
HCT: 37.2 % — ABNORMAL LOW (ref 39.0–52.0)
Hemoglobin: 13.2 g/dL (ref 13.0–17.0)
MCH: 35.9 pg — ABNORMAL HIGH (ref 26.0–34.0)
MCHC: 35.5 g/dL (ref 30.0–36.0)
MCV: 101.1 fL — ABNORMAL HIGH (ref 80.0–100.0)
Platelets: 121 10*3/uL — ABNORMAL LOW (ref 150–400)
RBC: 3.68 MIL/uL — ABNORMAL LOW (ref 4.22–5.81)
RDW: 16.8 % — ABNORMAL HIGH (ref 11.5–15.5)
WBC: 3.9 10*3/uL — ABNORMAL LOW (ref 4.0–10.5)
nRBC: 0 % (ref 0.0–0.2)

## 2022-08-11 LAB — BASIC METABOLIC PANEL
Anion gap: 8 (ref 5–15)
BUN: 26 mg/dL — ABNORMAL HIGH (ref 6–20)
CO2: 25 mmol/L (ref 22–32)
Calcium: 8.3 mg/dL — ABNORMAL LOW (ref 8.9–10.3)
Chloride: 101 mmol/L (ref 98–111)
Creatinine, Ser: 1.47 mg/dL — ABNORMAL HIGH (ref 0.61–1.24)
GFR, Estimated: 57 mL/min — ABNORMAL LOW (ref >60)
Glucose, Bld: 99 mg/dL (ref 70–99)
Potassium: 3.8 mmol/L (ref 3.5–5.1)
Sodium: 134 mmol/L — ABNORMAL LOW (ref 135–145)

## 2022-08-11 LAB — PROTIME-INR
INR: 1.1 (ref 0.8–1.2)
Prothrombin Time: 14.3 seconds (ref 11.4–15.2)

## 2022-08-11 LAB — MAGNESIUM: Magnesium: 2 mg/dL (ref 1.7–2.4)

## 2022-08-11 MED ORDER — POTASSIUM CHLORIDE 20 MEQ PO PACK
20.0000 meq | PACK | Freq: Once | ORAL | Status: AC
  Administered 2022-08-11: 20 meq via ORAL
  Filled 2022-08-11: qty 1

## 2022-08-11 NOTE — Progress Notes (Signed)
PT Cancellation Note  Patient Details Name: Charles [name] MRN: [id number] DOB: 1971/03/15   Cancelled Treatment:    Reason Eval/Treat Not Completed: PT screened, no needs identified, will sign off. Pt independent with mobility without difficulty per pt and per nurse tech.    Shary Decamp [facility] 08/11/2022, 2:55 PM Suanne Marker PT [facility] [employer] [phone]

## 2022-08-11 NOTE — Progress Notes (Signed)
Patient is non-compliant with diet and fluid restriction. Constantly calling out for drinks and snacks.

## 2022-08-11 NOTE — Progress Notes (Signed)
   08/11/22 2058  BiPAP/CPAP/SIPAP  $ Non-Invasive Ventilator  Non-Invasive Vent Subsequent  BiPAP/CPAP/SIPAP Pt Type Adult  BiPAP/CPAP/SIPAP DREAMSTATIOND  BiPAP/CPAP /SiPAP Vitals  Pulse Rate 80  Resp 18  SpO2 99 %  Bilateral Breath Sounds Clear;Diminished   Pt refused nocturnal CPAP tonight. No distress on RA. Hospital Dreamstation CPAP unit on stand by @ bedside. RT to follow

## 2022-08-11 NOTE — Progress Notes (Signed)
HD#1 Subjective:  Overnight Events: none  Patient assessed at bedside this morning.  He reports that he did not sleep overnight.  Is able to lay flat and breathe comfortably now.  There are no further episodes of chest discomfort overnight.  He is interested in resources to help with cocaine cessation, but feels that he needs more assistance with pursuing them.  Pt is updated on the plan for today, and all questions and concerns are addressed.   Objective:  Vital signs in last 24 hours: Vitals:   08/10/22 1630 08/10/22 2001 08/11/22 0223 08/11/22 0449  BP:  (!) 149/94  (!) 141/106  Pulse: 97 93 84 72  Resp: 18 18 18 18   Temp:  98.2 F (36.8 C) 98.2 F (36.8 C) 98.1 F (36.7 C)  TempSrc:  Oral Oral Oral  SpO2: 100% 92% 98% 99%  Weight:   70.6 kg   Height:       Supplemental O2: Room Air SpO2: 99 % FiO2 (%): 21 %   Physical Exam:  Constitutional: well-appearing, in no acute distress Cardiovascular: regular rate and rhythm, no m/r/g, no JVD Pulmonary/Chest: normal work of breathing on room air, crackles appreciated to lower lung bases bilaterally Abdominal: soft, non-tender, non-distended MSK: No lower extremity edema Skin: warm and dry  Filed Weights   08/10/22 1059 08/11/22 0223  Weight: 68.5 kg 70.6 kg     Intake/Output Summary (Last 24 hours) at 08/11/2022 0619 Last data filed at 08/11/2022 0231 Gross per 24 hour  Intake 700 ml  Output 2551 ml  Net -1851 ml   Net IO Since Admission: -1,851 mL [08/11/22 0619]  Pertinent Labs:    Latest Ref Rng & Units 08/11/2022   12:34 AM 08/10/2022   11:38 AM 07/31/2022   12:21 PM  CBC  WBC 4.0 - 10.5 K/uL 3.9  3.8  3.5   Hemoglobin 13.0 - 17.0 g/dL 13.2  13.1  14.9   Hematocrit 39.0 - 52.0 % 37.2  38.3  43.5   Platelets 150 - 400 K/uL 121  105  118        Latest Ref Rng & Units 08/11/2022   12:34 AM 08/10/2022   11:38 AM 07/31/2022   12:21 PM  CMP  Glucose 70 - 99 mg/dL 99  93  105   BUN 6 - 20 mg/dL 26  15  23     Creatinine 0.61 - 1.24 mg/dL 1.47  1.30  1.56   Sodium 135 - 145 mmol/L 134  137  138   Potassium 3.5 - 5.1 mmol/L 3.8  4.2  4.1   Chloride 98 - 111 mmol/L 101  105  106   CO2 22 - 32 mmol/L 25  23  23    Calcium 8.9 - 10.3 mg/dL 8.3  8.7  8.6   Total Protein 6.5 - 8.1 g/dL   6.5   Total Bilirubin 0.3 - 1.2 mg/dL   0.9   Alkaline Phos 38 - 126 U/L   69   AST 15 - 41 U/L   27   ALT 0 - 44 U/L   24     Assessment/Plan:   Active Problems:   Acute exacerbation of CHF (congestive heart failure) (HCC)   Patient Summary: Charles [name] is a 52 y.o. with a pertinent PMH of HFrEF, polysubstance use, aplastic anemia, who presented with shortness of breath and admitted on 3/30 for decompensated heart failure on HD#1.    Acute decompensated systolic heart failure (  EF 30 to 35%) He is able to lay flat this morning without dyspnea.  Lung exam continues to have crackles to lung bases bilaterally, no JVD.  He was given 1 dose of IV Lasix with adequate urine output at 2 L.  I will give additional IV diuretics today with IV Lasix 40 mg twice daily.  Electrolytes were repleted. -IV Lasix 40 mg twice daily -Continue GDMT: carvedilol 3.125 mg BID, dapagliflozin , losartan 25 mg and spironolactone 12.5 mg daily  -Electrolytes for K greater than 4 and mag greater than 2 -Daily weights -strict I and Os  Chronic hepatitis C Thrombocytopenia Hepatitis C RNA in 6 million at the last admission.  He needs to follow-up with ID to have this treated. INR within normal limits.  Platelets stable at 121. -CBC -CMP  Aplastic anemia Hemoglobin stable at 13.2 -CBC  COPD No wheezing on exam. -O2 saturation greater than 90%  Diet: Na restricted and fluid restriction VTE: NOAC Code: Full PT/OT recs: Pending. TOC recs: pending Family Update: niece updated by phone 3/30   Dispo: Anticipated discharge to  [facility]  in 1 day pending IV diuresis.   Charles [name] M. Charles [name], D.O.  Internal Medicine Resident,  PGY-2 [facility]  Pager: [phone] 6:19 AM, 08/11/2022   **Please contact the on call pager after 5 pm and on weekends at [phone].**

## 2022-08-11 NOTE — Progress Notes (Signed)
OT Cancellation Note  Patient Details Name: Charles [name] MRN: [id number] DOB: March 16, 1971   Cancelled Treatment:    Reason Eval/Treat Not Completed: OT screened, no needs identified, will sign off.  PT informed OT no acute needs exist.    Caterra Ostroff D Shuan Statzer 08/11/2022, 4:14 PM 08/11/2022  RP, OTR/L  Acute Rehabilitation Services  Office:  [phone]

## 2022-08-12 DIAGNOSIS — I509 Heart failure, unspecified: Secondary | ICD-10-CM | POA: Diagnosis not present

## 2022-08-12 DIAGNOSIS — F17210 Nicotine dependence, cigarettes, uncomplicated: Secondary | ICD-10-CM | POA: Diagnosis not present

## 2022-08-12 DIAGNOSIS — I110 Hypertensive heart disease with heart failure: Secondary | ICD-10-CM | POA: Diagnosis not present

## 2022-08-12 LAB — COMPREHENSIVE METABOLIC PANEL
ALT: 27 U/L (ref 0–44)
AST: 26 U/L (ref 15–41)
Albumin: 3 g/dL — ABNORMAL LOW (ref 3.5–5.0)
Alkaline Phosphatase: 61 U/L (ref 38–126)
Anion gap: 8 (ref 5–15)
BUN: 25 mg/dL — ABNORMAL HIGH (ref 6–20)
CO2: 24 mmol/L (ref 22–32)
Calcium: 8.4 mg/dL — ABNORMAL LOW (ref 8.9–10.3)
Chloride: 98 mmol/L (ref 98–111)
Creatinine, Ser: 1.34 mg/dL — ABNORMAL HIGH (ref 0.61–1.24)
GFR, Estimated: >60 mL/min (ref >60)
Glucose, Bld: 113 mg/dL — ABNORMAL HIGH (ref 70–99)
Potassium: 3.5 mmol/L (ref 3.5–5.1)
Sodium: 130 mmol/L — ABNORMAL LOW (ref 135–145)
Total Bilirubin: 0.6 mg/dL (ref 0.3–1.2)
Total Protein: 6.4 g/dL — ABNORMAL LOW (ref 6.5–8.1)

## 2022-08-12 LAB — CBC
HCT: 43.3 % (ref 39.0–52.0)
Hemoglobin: 14.8 g/dL (ref 13.0–17.0)
MCH: 35 pg — ABNORMAL HIGH (ref 26.0–34.0)
MCHC: 34.2 g/dL (ref 30.0–36.0)
MCV: 102.4 fL — ABNORMAL HIGH (ref 80.0–100.0)
Platelets: 121 10*3/uL — ABNORMAL LOW (ref 150–400)
RBC: 4.23 MIL/uL (ref 4.22–5.81)
RDW: 16.3 % — ABNORMAL HIGH (ref 11.5–15.5)
WBC: 3 10*3/uL — ABNORMAL LOW (ref 4.0–10.5)
nRBC: 0 % (ref 0.0–0.2)

## 2022-08-12 LAB — MAGNESIUM: Magnesium: 2.2 mg/dL (ref 1.7–2.4)

## 2022-08-12 MED ORDER — POTASSIUM CHLORIDE CRYS ER 20 MEQ PO TBCR
40.0000 meq | EXTENDED_RELEASE_TABLET | Freq: Two times a day (BID) | ORAL | Status: AC
  Administered 2022-08-12 (×2): 40 meq via ORAL
  Filled 2022-08-12 (×2): qty 2

## 2022-08-12 MED ORDER — HEPATITIS B VAC RECOMBINANT 10 MCG/ML IJ SUSP
1.0000 mL | Freq: Once | INTRAMUSCULAR | Status: AC
  Administered 2022-08-12: 10 ug via INTRAMUSCULAR
  Filled 2022-08-12: qty 1

## 2022-08-12 MED ORDER — FUROSEMIDE 40 MG PO TABS: 40.0000 mg | ORAL_TABLET | Freq: Every day | ORAL | Status: DC

## 2022-08-12 MED ORDER — HEPATITIS A VACCINE 1440 EL U/ML IM SUSP
1.0000 mL | Freq: Once | INTRAMUSCULAR | Status: AC
  Administered 2022-08-12: 1440 [IU] via INTRAMUSCULAR
  Filled 2022-08-12: qty 1

## 2022-08-12 MED ORDER — ONDANSETRON HCL 4 MG/2ML IJ SOLN
4.0000 mg | Freq: Once | INTRAMUSCULAR | Status: AC
  Administered 2022-08-12: 4 mg via INTRAVENOUS
  Filled 2022-08-12: qty 2

## 2022-08-12 MED ORDER — SPIRONOLACTONE 25 MG PO TABS: 25.0000 mg | ORAL_TABLET | Freq: Every day | ORAL | Status: DC

## 2022-08-12 MED ORDER — ORAL CARE MOUTH RINSE: 15.0000 mL | OROMUCOSAL | Status: DC | PRN

## 2022-08-12 MED ORDER — BUPRENORPHINE HCL-NALOXONE HCL 2-0.5 MG SL SUBL
1.0000 | SUBLINGUAL_TABLET | Freq: Once | SUBLINGUAL | Status: AC
  Administered 2022-08-12: 1 via SUBLINGUAL
  Filled 2022-08-12: qty 1
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
## 2022-08-12 NOTE — Progress Notes (Signed)
Heart Failure Navigator Progress Note  Assessed for Heart & Vascular TOC clinic readiness.  Patient does not meet criteria due to [facility] cardiology patient.   Navigator will sign off at this time.    Marqueta Pulley, BSN, RN Heart Failure Nurse Navigator Secure Chat Only   

## 2022-08-12 NOTE — TOC Initial Note (Signed)
Transition of Care [facility]) - Initial/Assessment Note    Patient Details  Name: Charles [name] MRN: [id number] Date of Birth: 1971-04-01  Transition of Care [facility]) CM/SW Contact:    Zenon Mayo, RN Phone Number: 08/12/2022, 4:49 PM  Clinical Narrative:                 NCM spoke with patient at the bedside, he states he lives on the streets, he states he needs someone to help him get in the door at a shelter. He is ambulatory and can ride a bus, he has insurance and Medicaid.  Presents with CHF, chronic Hep C, R knee pain.  TOC following.  Expected Discharge Plan: Homeless Shelter Barriers to Discharge: Continued Medical Work up   Patient Goals and CMS Choice     Choice offered to / list presented to : NA      Expected Discharge Plan and Services In-house Referral: NA Discharge Planning Services: CM Consult Post Acute Care Choice: NA Living arrangements for the past 2 months: Homeless Shelter                 DME Arranged: N/A         HH Arranged: NA          Prior Living Arrangements/Services Living arrangements for the past 2 months: [place] with::  (Homeless) Patient language and need for interpreter reviewed:: Yes Do you feel safe going back to the place where you live?: Yes      Need for Family Participation in Patient Care: No (Comment) Care giver support system in place?: No (comment)   Criminal Activity/Legal Involvement Pertinent to Current Situation/Hospitalization: No - Comment as needed  Activities of Daily Living Home Assistive Devices/Equipment: None ADL Screening (condition at time of admission) Patient's cognitive ability adequate to safely complete daily activities?: Yes Is the patient deaf or have difficulty hearing?: No Does the patient have difficulty seeing, even when wearing glasses/contacts?: No Does the patient have difficulty concentrating, remembering, or making decisions?: No Patient able to express need for  assistance with ADLs?: Yes Does the patient have difficulty dressing or bathing?: No Independently performs ADLs?: Yes (appropriate for developmental age) Does the patient have difficulty walking or climbing stairs?: No Weakness of Legs: None Weakness of Arms/Hands: None  Permission Sought/Granted                  Emotional Assessment Appearance:: Appears stated age Attitude/Demeanor/Rapport: Angry Affect (typically observed): Agitated Orientation: : Oriented to Self, Oriented to Place, Oriented to  Time, Oriented to Situation Alcohol / Substance Use: Not Applicable Psych Involvement: No (comment)  Admission diagnosis:  Acute exacerbation of CHF (congestive heart failure) [I50.9] Acute on chronic congestive heart failure, unspecified heart failure type [I50.9] Patient Active Problem List   Diagnosis Date Noted   Acute exacerbation of CHF (congestive heart failure) 08/10/2022   Essential hypertension 07/22/2022   History of hepatitis C 07/22/2022   Acute on chronic systolic CHF (congestive heart failure) 02/28/2022   Elevated troponin 02/28/2022   CKD (chronic kidney disease), stage II 02/28/2022   Cocaine use disorder 02/28/2022   Bacteremia 05/27/2021   Acute on chronic systolic and diastolic CHF (congestive heart failure) ([place]) 05/27/2021   Hyperglycemia 05/27/2021   Acute respiratory failure with hypoxia 05/26/2021   Hypertensive urgency 05/26/2021   Polysubstance abuse 05/26/2021   COPD with acute exacerbation 05/26/2021   Ischemic chest pain 05/26/2021   Hepatitis C 05/26/2021   Homelessness  05/26/2021   Encephalopathy acute 06/06/2020   Seizure 06/06/2020   Aspiration pneumonia 06/22/2019   Acute respiratory failure with hypoxia and hypercapnia 06/22/2019   Hypokalemia 06/22/2019   Pneumonia due to COVID-19 virus 06/22/2019   Splenic laceration, initial encounter 08/02/2016   Thrombocytopenia ([place]) 10/19/2014   Tobacco use disorder 10/19/2014   AKI (acute  kidney injury) ([place]) 10/19/2014   Bilateral pneumonia 10/17/2014   Multifocal pneumonia 10/17/2014   PCP:  Lucianne Lei, MD Pharmacy:   [facility] [place], [place] [address] [address] Phone: [phone] Fax: [place], [place] [address] Phone: [phone] Fax: [phone]  [facility] [address] [place] [address] Phone: [phone] Fax: [phone]     Social Determinants of Health (SDOH) Social History: SDOH Screenings   Food Insecurity: Patient Declined (08/10/2022)  Housing: High Risk (08/10/2022)  Transportation Needs: Patient Declined (08/10/2022)  Utilities: At Risk (08/10/2022)  Tobacco Use: High Risk (08/11/2022)   SDOH Interventions:     Readmission Risk Interventions    08/12/2022    4:45 PM 07/23/2022    4:29 PM 05/28/2021    2:11 PM  Readmission Risk Prevention Plan  Transportation Screening Complete Complete Complete  PCP or Specialist Appt within 3-5 Days   Complete  HRI or [place]   Complete  Social Work Consult for [place] Planning/Counseling   Complete  Palliative Care Screening   Not Applicable  Medication Review Press photographer) Complete Complete Referral to Pharmacy  PCP or Specialist appointment within 3-5 days of discharge Complete Complete   HRI or [place] Complete Complete   SW Recovery Care/Counseling Consult Complete Complete   Palliative Care Screening Not Applicable Not [place] Not Applicable Not Applicable

## 2022-08-13 DIAGNOSIS — G8929 Other chronic pain: Secondary | ICD-10-CM | POA: Insufficient documentation

## 2022-08-13 DIAGNOSIS — E875 Hyperkalemia: Secondary | ICD-10-CM

## 2022-08-13 DIAGNOSIS — I5023 Acute on chronic systolic (congestive) heart failure: Secondary | ICD-10-CM | POA: Diagnosis not present

## 2022-08-13 DIAGNOSIS — F17210 Nicotine dependence, cigarettes, uncomplicated: Secondary | ICD-10-CM | POA: Diagnosis not present

## 2022-08-13 LAB — BASIC METABOLIC PANEL
Anion gap: 9 (ref 5–15)
Anion gap: 11 (ref 5–15)
BUN: 30 mg/dL — ABNORMAL HIGH (ref 6–20)
BUN: 28 mg/dL — ABNORMAL HIGH (ref 6–20)
CO2: 28 mmol/L (ref 22–32)
CO2: 25 mmol/L (ref 22–32)
Calcium: 9.2 mg/dL (ref 8.9–10.3)
Calcium: 9 mg/dL (ref 8.9–10.3)
Chloride: 99 mmol/L (ref 98–111)
Chloride: 96 mmol/L — ABNORMAL LOW (ref 98–111)
Creatinine, Ser: 1.79 mg/dL — ABNORMAL HIGH (ref 0.61–1.24)
Creatinine, Ser: 1.53 mg/dL — ABNORMAL HIGH (ref 0.61–1.24)
GFR, Estimated: 45 mL/min — ABNORMAL LOW (ref >60)
GFR, Estimated: 55 mL/min — ABNORMAL LOW (ref >60)
Glucose, Bld: 108 mg/dL — ABNORMAL HIGH (ref 70–99)
Glucose, Bld: 109 mg/dL — ABNORMAL HIGH (ref 70–99)
Potassium: 5.4 mmol/L — ABNORMAL HIGH (ref 3.5–5.1)
Potassium: 4.7 mmol/L (ref 3.5–5.1)
Sodium: 136 mmol/L (ref 135–145)
Sodium: 132 mmol/L — ABNORMAL LOW (ref 135–145)

## 2022-08-13 MED ORDER — ALUM & MAG HYDROXIDE-SIMETH 200-200-20 MG/5ML PO SUSP
15.0000 mL | Freq: Once | ORAL | Status: DC
  Filled 2022-08-13: qty 30

## 2022-08-13 MED ORDER — FUROSEMIDE 40 MG PO TABS
40.0000 mg | ORAL_TABLET | Freq: Every day | ORAL | Status: DC
  Administered 2022-08-13 – 2022-08-14 (×2): 40 mg via ORAL
  Filled 2022-08-13 (×2): qty 1

## 2022-08-13 MED ORDER — PANTOPRAZOLE SODIUM 40 MG PO TBEC: 40.0000 mg | DELAYED_RELEASE_TABLET | Freq: Every day | ORAL | Status: DC

## 2022-08-13 MED ORDER — TETANUS-DIPHTH-ACELL PERTUSSIS 5-2.5-18.5 LF-MCG/0.5 IM SUSY
0.5000 mL | PREFILLED_SYRINGE | Freq: Once | INTRAMUSCULAR | Status: DC
  Filled 2022-08-13: qty 0.5

## 2022-08-13 MED ORDER — TRIMETHOBENZAMIDE HCL 100 MG/ML IM SOLN
200.0000 mg | Freq: Once | INTRAMUSCULAR | Status: AC
  Administered 2022-08-13: 200 mg via INTRAMUSCULAR
  Filled 2022-08-13: qty 2

## 2022-08-13 MED ORDER — TETANUS-DIPHTH-ACELL PERTUSSIS 5-2.5-18.5 LF-MCG/0.5 IM SUSY
0.5000 mL | PREFILLED_SYRINGE | Freq: Once | INTRAMUSCULAR | Status: AC
  Administered 2022-08-13: 0.5 mL via INTRAMUSCULAR
  Filled 2022-08-13: qty 0.5

## 2022-08-13 MED ORDER — ALUM & MAG HYDROXIDE-SIMETH 200-200-20 MG/5ML PO SUSP
15.0000 mL | Freq: Once | ORAL | Status: AC
  Administered 2022-08-13: 15 mL via ORAL
  Filled 2022-08-13: qty 30

## 2022-08-13 MED ORDER — ONDANSETRON HCL 4 MG/2ML IJ SOLN
4.0000 mg | Freq: Once | INTRAMUSCULAR | Status: AC
  Administered 2022-08-13: 4 mg via INTRAVENOUS
  Filled 2022-08-13: qty 2

## 2022-08-13 MED ORDER — SIMETHICONE 80 MG PO CHEW
80.0000 mg | CHEWABLE_TABLET | Freq: Every day | ORAL | Status: DC | PRN
  Administered 2022-08-13: 80 mg via ORAL
  Filled 2022-08-13: qty 1

## 2022-08-13 NOTE — Care Management Important Message (Signed)
Important Message  Patient Details  Name: Charles [name] MRN: [id number] Date of Birth: [birth year]   Medicare Important Message Given:  Yes     Shelda Altes 08/13/2022, 10:17 AM
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
## 2022-08-13 NOTE — Progress Notes (Signed)
Notified by CCMD that pt is having rhythm changes (junctional to NSR).  MD notified.  Pt asymptomatic.  Will continue to monitor.

## 2022-08-13 NOTE — Progress Notes (Signed)
Pt vape at bedside - vape placed at nursing station in black bin with patient sticker.  Will return to pt upon d/c.

## 2022-08-13 NOTE — Progress Notes (Signed)
CLINICAL UPDATE  Paged by RN for chest pain, ECG taken. This provider went to bedside to evaluate patient. Reports chest pain started when he woke up from a nap roughly 30 minutes ago. Describes burning sensation in middle of chest that feels like food is stuck in his esophagus. Reports exacerbation of pain with deep breaths. Denies any dyspnea, nausea, vomiting, diaphoresis.   On exam, patient is afebrile, HR 80, BP 116/81, sating well on room air.  General: Resting comfortably, no acute distress CV: Regular rate, rhythm. No murmurs appreciated. Warm extremities. No chest pain with palpation. Pulm: Normal work of breathing on room air. Clear to auscultation bilaterally. Neuro: Awake, alert, conversing appropriately.  ECG in normal sinus rhythm with T-wave inversions in I, II, aVF, V4-V6. While these were not all present on admitting ECG, I do appreciate these changes in prior ECG's. Tele reviewed, no abnormal rhythms noted. I do not believe Mr. Borntreger's chest pain is cardiac in nature, appears more related to gastric reflux. Will give dose of Maalox, instructed Mr. Sylte to let RN know if pain worsens or changes.   - Give Maalox x1, consider addition of PPI if recurrent - Continue telemetry - Check lytes in AM  Sanjuan Dame, MD Internal Medicine PGY-3 Pager: [phone]

## 2022-08-13 NOTE — Progress Notes (Signed)
Pt complaining of left chest pain radiating to back.  Pain rated at 6/10.  EKG and vital signs obtained.  MD notified.

## 2022-08-14 ENCOUNTER — Other Ambulatory Visit (HOSPITAL_COMMUNITY): Payer: Self-pay

## 2022-08-14 DIAGNOSIS — F1410 Cocaine abuse, uncomplicated: Secondary | ICD-10-CM

## 2022-08-14 DIAGNOSIS — I5023 Acute on chronic systolic (congestive) heart failure: Secondary | ICD-10-CM | POA: Diagnosis not present

## 2022-08-14 DIAGNOSIS — B182 Chronic viral hepatitis C: Secondary | ICD-10-CM

## 2022-08-14 DIAGNOSIS — I509 Heart failure, unspecified: Secondary | ICD-10-CM | POA: Diagnosis not present

## 2022-08-14 LAB — BASIC METABOLIC PANEL
Anion gap: 7 (ref 5–15)
BUN: 30 mg/dL — ABNORMAL HIGH (ref 6–20)
CO2: 28 mmol/L (ref 22–32)
Calcium: 8.7 mg/dL — ABNORMAL LOW (ref 8.9–10.3)
Chloride: 98 mmol/L (ref 98–111)
Creatinine, Ser: 1.43 mg/dL — ABNORMAL HIGH (ref 0.61–1.24)
GFR, Estimated: 59 mL/min — ABNORMAL LOW (ref >60)
Glucose, Bld: 104 mg/dL — ABNORMAL HIGH (ref 70–99)
Potassium: 4.9 mmol/L (ref 3.5–5.1)
Sodium: 133 mmol/L — ABNORMAL LOW (ref 135–145)

## 2022-08-14 MED ORDER — ONDANSETRON 4 MG PO TBDP
4.0000 mg | ORAL_TABLET | Freq: Once | ORAL | Status: AC
  Administered 2022-08-14: 4 mg via ORAL
  Filled 2022-08-14: qty 1

## 2022-08-14 MED ORDER — LOSARTAN POTASSIUM 25 MG PO TABS
25.0000 mg | ORAL_TABLET | Freq: Every day | ORAL | 2 refills | Status: DC
  Filled 2022-08-14: qty 30, 30d supply, fill #0

## 2022-08-14 MED ORDER — EMPAGLIFLOZIN 10 MG PO TABS
10.0000 mg | ORAL_TABLET | Freq: Every day | ORAL | 2 refills | Status: DC
  Filled 2022-08-14: qty 30, 30d supply, fill #0

## 2022-08-14 MED ORDER — FUROSEMIDE 40 MG PO TABS
40.0000 mg | ORAL_TABLET | Freq: Every day | ORAL | 2 refills | Status: DC
  Filled 2022-08-14: qty 30, 30d supply, fill #0

## 2022-08-14 MED ORDER — SPIRONOLACTONE 25 MG PO TABS
12.5000 mg | ORAL_TABLET | Freq: Every day | ORAL | 2 refills | Status: DC
  Filled 2022-08-14: qty 15, 30d supply, fill #0

## 2022-08-14 MED ORDER — CARVEDILOL 3.125 MG PO TABS
3.1250 mg | ORAL_TABLET | Freq: Two times a day (BID) | ORAL | 2 refills | Status: DC
  Filled 2022-08-14: qty 60, 30d supply, fill #0
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
## 2022-08-14 NOTE — Progress Notes (Signed)
Pt said he is not ready for cpap at this time.  

## 2022-08-14 NOTE — Discharge Instructions (Addendum)
To Mr. Charles [name] or their caretakers,  They were evaluated and treated in the hospital for:  Active Problems:   Cocaine use disorder   Chronic pain  Principal Problem (Resolved):   Acute exacerbation of CHF (congestive heart failure) Resolved Problems:   Hyperkalemia  The evaluation suggested acute on chronic systolic heart failure due to cocaine use and medication non-adherence. They were treated with IV furosemide and guideline-directed medical therapy.  They were discharged from the hospital on 08/14/22. I recommend the following after leaving the hospital:   The most important thing they can do for their health is to stop using cocaine.  I strongly recommend the following medications: - Carvedilol 3.125 mg twice daily - Empagliflozin 10 mg daily - Losartan 25 mg daily - Spironolactone 25 mg daily  Take the rest of your medication as prescribed.  Continue taking furosemide daily. You can take an extra tablet for shortness of breath, leg swelling, or weight gain of 3 lbs in a day or 5 lbs in a week. If you take extra furosemide, call you doctor.  Please make every attempt to go to your follow-up appointments.  An appointment with your primary care physician has been arranged:   Follow-up Information     Delene Ruffini, MD. Go on 08/23/2022.   Specialty: Internal Medicine Why: Please arrive 15 minutes early for your appointment at 10:45 AM Contact information: [place] [address] [phone]                 Nani Gasser MD 08/14/2022, 9:50 AM

## 2022-08-14 NOTE — Significant Event (Signed)
pharmacy went over each medication with patient . Writer went in to go over AVS. Pt stated that his "does not want to hear this shit" and called his niece to pick him up.

## 2022-08-14 NOTE — TOC Transition Note (Addendum)
Transition of Care [facility]) - CM/SW Discharge Note   Patient Details  Name: Charles [name] MRN: [id number] Date of Birth: 03-10-1971  [facility]) CM/SW Contact:  Zenon Mayo, RN Phone Number: 08/14/2022, 11:43 AM   Clinical Narrative:    Patient is for dc today, he is talking to his niece on the phone asking her to come and pick him up.  He states he needs two bus passes.  NCM gave him two bus passes.  He states he is not going to the [employer].  He states his niece has a copy of his ID and he will need to get that from her.  NCM gave him a copy of his face sheet.  Gave him housing resources and SA resources.   Final next level of care: Homeless Shelter Barriers to Discharge: Continued Medical Work up   Patient Goals and CMS Choice   Choice offered to / list presented to : NA  Discharge Placement                         Discharge Plan and Services Additional resources added to the After Visit Summary for   In-house Referral: NA Discharge Planning Services: CM Consult Post Acute Care Choice: NA          DME Arranged: N/A         HH Arranged: NA          Social Determinants of Health (SDOH) Interventions SDOH Screenings   Food Insecurity: Patient Declined (08/10/2022)  Housing: High Risk (08/10/2022)  Transportation Needs: Patient Declined (08/10/2022)  Utilities: At Risk (08/10/2022)  Tobacco Use: High Risk (08/11/2022)     Readmission Risk Interventions    08/12/2022    4:45 PM 07/23/2022    4:29 PM 05/28/2021    2:11 PM  Readmission Risk Prevention Plan  Transportation Screening Complete Complete Complete  PCP or Specialist Appt within 3-5 Days   Complete  HRI or [place]   Complete  Social Work Consult for [place] Planning/Counseling   Complete  Palliative Care Screening   Not Applicable  Medication Review [employer] photographer) Complete Complete Referral to Pharmacy  PCP or Specialist appointment within 3-5 days of  discharge Complete Complete   HRI or [place] Complete Complete   SW Recovery Care/Counseling Consult Complete Complete   Palliative Care Screening Not Applicable Not [place] Not Applicable Not Applicable

## 2022-08-23 ENCOUNTER — Encounter: Payer: Medicare HMO | Admitting: Internal Medicine

## 2022-08-23 NOTE — Progress Notes (Deleted)
   CC: ***  HPI:Mr.Charles [name] is a 53 y.o. male who presents for evaluation of ***. Please see individual problem based A/P for details.  51 yom NHFU for acute on chronic chf  HFrEF 30-35% It is uncrtain if this is from cocaine or ischemic, we can focus on card referral for further workup different day Coreg 3.125 BID Jardiance  10mg  Losartan 25mg  daily Spiro 25 Lasix 40mg  daily  Hep C infection - refer to [facility]cher  Chronic pain He was using cocaine? To self medicate - did not tolerate suboxone - N/V and "high" - no good solution for this - could get Rx for some tylenol (want to avoid nsaids until we know more about his chf)  Social determinants - homeness, cocaine use, problems with med adherence - case management referral 2300 - orange card - close return f/u, hopefully on a day that he could see hatcher as well - maybe try to send his stuff IM 4$   Depression, PHQ-9: Based on the patients  score we have ***.  Past Medical History:  Diagnosis Date   Aplastic anemia (HCC)    Arthritis    "left ankle" (10/17/2014)   Bilateral pneumonia 10/17/2014   Hepatitis    "think it was B" (10/17/2014)   History of blood transfusion "several"   "related to aplastic anemia"   Hypertension    Laceration of spleen    s/p embolization   Polysubstance abuse (HCC)    cocaine, benzo's, opiates, THC   Sleep apnea    "wore mask in prison; 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" (10/17/2014)   Review of Systems:   ROS   Physical Exam: There were no vitals filed for this visit.   General: *** HEENT: Conjunctiva nl , antiicteric sclerae, moist mucous membranes, no exudate or erythema Cardiovascular: Normal rate, regular rhythm.  No murmurs, rubs, or gallops Pulmonary : Equal breath sounds, No wheezes, rales, or rhonchi Abdominal: soft, nontender,  bowel sounds present Ext: No edema in lower extremities, no tenderness to palpation of lower extremities.   Assessment & Plan:   See Encounters Tab for  problem based charting.  Patient {GC/GE:[id number]::"discussed with","seen with"} Dr. {[id number]::"I. Hoffman","Guilloud","Mullen","Narendra","Raines","Vincent","Williams"}

## 2022-09-03 ENCOUNTER — Encounter (HOSPITAL_COMMUNITY): Payer: Self-pay

## 2022-09-03 ENCOUNTER — Inpatient Hospital Stay (HOSPITAL_COMMUNITY)
Admission: EM | Admit: 2022-09-03 | Discharge: 2022-09-05 | DRG: 193 | Disposition: A | Discharge status: Home / Self Care | Payer: Medicare HMO | Attending: Internal Medicine | Admitting: Internal Medicine

## 2022-09-03 ENCOUNTER — Other Ambulatory Visit: Payer: Self-pay

## 2022-09-03 ENCOUNTER — Emergency Department (HOSPITAL_COMMUNITY): Payer: Medicare HMO

## 2022-09-03 DIAGNOSIS — R609 Edema, unspecified: Secondary | ICD-10-CM | POA: Diagnosis not present

## 2022-09-03 DIAGNOSIS — Z79899 Other long term (current) drug therapy: Secondary | ICD-10-CM

## 2022-09-03 DIAGNOSIS — I5023 Acute on chronic systolic (congestive) heart failure: Secondary | ICD-10-CM | POA: Diagnosis present

## 2022-09-03 DIAGNOSIS — J440 Chronic obstructive pulmonary disease with acute lower respiratory infection: Secondary | ICD-10-CM | POA: Diagnosis present

## 2022-09-03 DIAGNOSIS — Z91012 Allergy to eggs: Secondary | ICD-10-CM

## 2022-09-03 DIAGNOSIS — Z5900 Homelessness unspecified: Secondary | ICD-10-CM | POA: Diagnosis not present

## 2022-09-03 DIAGNOSIS — F17210 Nicotine dependence, cigarettes, uncomplicated: Secondary | ICD-10-CM | POA: Diagnosis present

## 2022-09-03 DIAGNOSIS — M25572 Pain in left ankle and joints of left foot: Secondary | ICD-10-CM | POA: Diagnosis present

## 2022-09-03 DIAGNOSIS — I10 Essential (primary) hypertension: Secondary | ICD-10-CM | POA: Diagnosis not present

## 2022-09-03 DIAGNOSIS — R079 Chest pain, unspecified: Secondary | ICD-10-CM | POA: Diagnosis not present

## 2022-09-03 DIAGNOSIS — E538 Deficiency of other specified B group vitamins: Secondary | ICD-10-CM | POA: Diagnosis present

## 2022-09-03 DIAGNOSIS — I130 Hypertensive heart and chronic kidney disease with heart failure and stage 1 through stage 4 chronic kidney disease, or unspecified chronic kidney disease: Secondary | ICD-10-CM | POA: Diagnosis present

## 2022-09-03 DIAGNOSIS — M7989 Other specified soft tissue disorders: Secondary | ICD-10-CM | POA: Diagnosis present

## 2022-09-03 DIAGNOSIS — Z7984 Long term (current) use of oral hypoglycemic drugs: Secondary | ICD-10-CM

## 2022-09-03 DIAGNOSIS — G8929 Other chronic pain: Secondary | ICD-10-CM | POA: Diagnosis present

## 2022-09-03 DIAGNOSIS — J189 Pneumonia, unspecified organism: Secondary | ICD-10-CM | POA: Diagnosis present

## 2022-09-03 DIAGNOSIS — R000 Tachycardia, unspecified: Secondary | ICD-10-CM | POA: Diagnosis not present

## 2022-09-03 DIAGNOSIS — R0602 Shortness of breath: Secondary | ICD-10-CM | POA: Diagnosis present

## 2022-09-03 DIAGNOSIS — M25561 Pain in right knee: Secondary | ICD-10-CM | POA: Diagnosis present

## 2022-09-03 DIAGNOSIS — M25551 Pain in right hip: Secondary | ICD-10-CM | POA: Diagnosis not present

## 2022-09-03 DIAGNOSIS — F17290 Nicotine dependence, other tobacco product, uncomplicated: Secondary | ICD-10-CM | POA: Diagnosis present

## 2022-09-03 DIAGNOSIS — B182 Chronic viral hepatitis C: Secondary | ICD-10-CM | POA: Diagnosis present

## 2022-09-03 DIAGNOSIS — I5020 Unspecified systolic (congestive) heart failure: Secondary | ICD-10-CM | POA: Diagnosis not present

## 2022-09-03 DIAGNOSIS — R0902 Hypoxemia: Principal | ICD-10-CM

## 2022-09-03 DIAGNOSIS — J9601 Acute respiratory failure with hypoxia: Secondary | ICD-10-CM | POA: Diagnosis present

## 2022-09-03 DIAGNOSIS — Z91018 Allergy to other foods: Secondary | ICD-10-CM | POA: Diagnosis not present

## 2022-09-03 DIAGNOSIS — N182 Chronic kidney disease, stage 2 (mild): Secondary | ICD-10-CM | POA: Diagnosis present

## 2022-09-03 DIAGNOSIS — F1490 Cocaine use, unspecified, uncomplicated: Secondary | ICD-10-CM | POA: Diagnosis present

## 2022-09-03 DIAGNOSIS — Z1152 Encounter for screening for COVID-19: Secondary | ICD-10-CM

## 2022-09-03 DIAGNOSIS — Z91014 Allergy to mammalian meats: Secondary | ICD-10-CM

## 2022-09-03 DIAGNOSIS — I110 Hypertensive heart disease with heart failure: Secondary | ICD-10-CM | POA: Diagnosis not present

## 2022-09-03 DIAGNOSIS — M549 Dorsalgia, unspecified: Secondary | ICD-10-CM | POA: Diagnosis not present

## 2022-09-03 LAB — PROCALCITONIN: Procalcitonin: 0.7 ng/mL

## 2022-09-03 LAB — HEPATIC FUNCTION PANEL
ALT: 18 U/L (ref 0–44)
AST: 25 U/L (ref 15–41)
Albumin: 2.6 g/dL — ABNORMAL LOW (ref 3.5–5.0)
Alkaline Phosphatase: 55 U/L (ref 38–126)
Bilirubin, Direct: 0.1 mg/dL (ref 0.0–0.2)
Indirect Bilirubin: 0.4 mg/dL (ref 0.3–0.9)
Total Bilirubin: 0.5 mg/dL (ref 0.3–1.2)
Total Protein: 5.8 g/dL — ABNORMAL LOW (ref 6.5–8.1)

## 2022-09-03 LAB — CBC WITH DIFFERENTIAL/PLATELET
Abs Immature Granulocytes: 0.01 10*3/uL (ref 0.00–0.07)
Basophils Absolute: 0 10*3/uL (ref 0.0–0.1)
Basophils Relative: 0 %
Eosinophils Absolute: 0 10*3/uL (ref 0.0–0.5)
Eosinophils Relative: 0 %
HCT: 35 % — ABNORMAL LOW (ref 39.0–52.0)
Hemoglobin: 12 g/dL — ABNORMAL LOW (ref 13.0–17.0)
Immature Granulocytes: 0 %
Lymphocytes Relative: 9 %
Lymphs Abs: 0.5 10*3/uL — ABNORMAL LOW (ref 0.7–4.0)
MCH: 34.7 pg — ABNORMAL HIGH (ref 26.0–34.0)
MCHC: 34.3 g/dL (ref 30.0–36.0)
MCV: 101.2 fL — ABNORMAL HIGH (ref 80.0–100.0)
Monocytes Absolute: 0.6 10*3/uL (ref 0.1–1.0)
Monocytes Relative: 10 %
Neutro Abs: 4.5 10*3/uL (ref 1.7–7.7)
Neutrophils Relative %: 81 %
Platelets: 148 10*3/uL — ABNORMAL LOW (ref 150–400)
RBC: 3.46 MIL/uL — ABNORMAL LOW (ref 4.22–5.81)
RDW: 16.5 % — ABNORMAL HIGH (ref 11.5–15.5)
WBC: 5.6 10*3/uL (ref 4.0–10.5)
nRBC: 0 % (ref 0.0–0.2)

## 2022-09-03 LAB — RESPIRATORY PANEL BY PCR

## 2022-09-03 LAB — BASIC METABOLIC PANEL
Anion gap: 9 (ref 5–15)
BUN: 15 mg/dL (ref 6–20)
CO2: 24 mmol/L (ref 22–32)
Calcium: 8.8 mg/dL — ABNORMAL LOW (ref 8.9–10.3)
Chloride: 102 mmol/L (ref 98–111)
Creatinine, Ser: 1.17 mg/dL (ref 0.61–1.24)
GFR, Estimated: >60 mL/min (ref >60)
Glucose, Bld: 94 mg/dL (ref 70–99)
Potassium: 3.8 mmol/L (ref 3.5–5.1)
Sodium: 135 mmol/L (ref 135–145)

## 2022-09-03 LAB — RESP PANEL BY RT-PCR (RSV, FLU A&B, COVID)  RVPGX2
Influenza A by PCR: NEGATIVE
Influenza B by PCR: NEGATIVE
Resp Syncytial Virus by PCR: NEGATIVE
SARS Coronavirus 2 by RT PCR: NEGATIVE

## 2022-09-03 LAB — TROPONIN I (HIGH SENSITIVITY)
Troponin I (High Sensitivity): 60 ng/L — ABNORMAL HIGH (ref <18)
Troponin I (High Sensitivity): 46 ng/L — ABNORMAL HIGH (ref <18)

## 2022-09-03 LAB — STREP PNEUMONIAE URINARY ANTIGEN: Strep Pneumo Urinary Antigen: NEGATIVE

## 2022-09-03 LAB — LACTIC ACID, PLASMA
Lactic Acid, Venous: 1 mmol/L (ref 0.5–1.9)
Lactic Acid, Venous: 1.3 mmol/L (ref 0.5–1.9)

## 2022-09-03 LAB — LIPASE, BLOOD: Lipase: 30 U/L (ref 11–51)

## 2022-09-03 LAB — BRAIN NATRIURETIC PEPTIDE: B Natriuretic Peptide: 1157.2 pg/mL — ABNORMAL HIGH (ref 0.0–100.0)

## 2022-09-03 MED ORDER — RIVAROXABAN 10 MG PO TABS
10.0000 mg | ORAL_TABLET | Freq: Every day | ORAL | Status: DC
  Administered 2022-09-03 – 2022-09-05 (×3): 10 mg via ORAL
  Filled 2022-09-03 (×3): qty 1

## 2022-09-03 MED ORDER — ACETAMINOPHEN 325 MG PO TABS
650.0000 mg | ORAL_TABLET | Freq: Four times a day (QID) | ORAL | Status: DC | PRN
  Administered 2022-09-05: 650 mg via ORAL
  Filled 2022-09-03: qty 2

## 2022-09-03 MED ORDER — SODIUM CHLORIDE 0.9 % IV SOLN
500.0000 mg | Freq: Once | INTRAVENOUS | Status: AC
  Administered 2022-09-03: 500 mg via INTRAVENOUS
  Filled 2022-09-03: qty 5

## 2022-09-03 MED ORDER — EMPAGLIFLOZIN 10 MG PO TABS
10.0000 mg | ORAL_TABLET | Freq: Every day | ORAL | Status: DC
  Administered 2022-09-03 – 2022-09-05 (×3): 10 mg via ORAL
  Filled 2022-09-03 (×3): qty 1

## 2022-09-03 MED ORDER — FUROSEMIDE 40 MG PO TABS
40.0000 mg | ORAL_TABLET | Freq: Every day | ORAL | Status: DC
  Administered 2022-09-03 – 2022-09-05 (×3): 40 mg via ORAL
  Filled 2022-09-03 (×2): qty 1
  Filled 2022-09-03: qty 2

## 2022-09-03 MED ORDER — IPRATROPIUM-ALBUTEROL 0.5-2.5 (3) MG/3ML IN SOLN: 3.0000 mL | Freq: Four times a day (QID) | RESPIRATORY_TRACT | Status: DC | PRN

## 2022-09-03 MED ORDER — ALBUTEROL SULFATE (2.5 MG/3ML) 0.083% IN NEBU: 2.5000 mg | INHALATION_SOLUTION | RESPIRATORY_TRACT | Status: DC | PRN

## 2022-09-03 MED ORDER — SPIRONOLACTONE 12.5 MG HALF TABLET
12.5000 mg | ORAL_TABLET | Freq: Every day | ORAL | Status: DC
  Administered 2022-09-03 – 2022-09-04 (×2): 12.5 mg via ORAL
  Filled 2022-09-03 (×3): qty 1

## 2022-09-03 MED ORDER — CARVEDILOL 3.125 MG PO TABS: 3.1250 mg | ORAL_TABLET | Freq: Two times a day (BID) | ORAL | Status: DC

## 2022-09-03 MED ORDER — SODIUM CHLORIDE 0.9 % IV SOLN
1.0000 g | INTRAVENOUS | Status: DC
  Administered 2022-09-04: 1 g via INTRAVENOUS
  Filled 2022-09-03: qty 10

## 2022-09-03 MED ORDER — SODIUM CHLORIDE 0.9 % IV SOLN
1.0000 g | Freq: Once | INTRAVENOUS | Status: AC
  Administered 2022-09-03: 1 g via INTRAVENOUS
  Filled 2022-09-03: qty 10

## 2022-09-03 MED ORDER — IPRATROPIUM-ALBUTEROL 0.5-2.5 (3) MG/3ML IN SOLN: 3.0000 mL | Freq: Four times a day (QID) | RESPIRATORY_TRACT | Status: DC

## 2022-09-03 MED ORDER — SODIUM CHLORIDE 0.9 % IV SOLN
500.0000 mg | INTRAVENOUS | Status: DC
  Filled 2022-09-03: qty 5

## 2022-09-03 MED ORDER — ACETAMINOPHEN 325 MG PO TABS
650.0000 mg | ORAL_TABLET | Freq: Once | ORAL | Status: AC
  Administered 2022-09-03: 650 mg via ORAL
  Filled 2022-09-03: qty 2
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
## 2022-09-03 NOTE — ED Provider Notes (Signed)
[place] EMERGENCY DEPARTMENT AT [facility] Provider Note   CSN: [id number] Arrival date & time: 09/03/22  1218     History  Chief Complaint  Patient presents with   Shortness of Breath    Charles [name] is a 52 y.o. male.  The history is provided by the patient and medical records. No language interpreter was used.  Shortness of Breath Severity:  Moderate Onset quality:  Gradual Duration:  1 day Timing:  Constant Progression:  Waxing and waning Chronicity:  Recurrent Context: URI   Relieved by:  Nothing Worsened by:  Coughing Ineffective treatments:  None tried Associated symptoms: cough, fever and sputum production   Associated symptoms: no abdominal pain, no chest pain, no claudication, no headaches, no neck pain, no rash, no vomiting and no wheezing        Home Medications Prior to Admission medications   Medication Sig Start Date End Date Taking? Authorizing Provider  albuterol (VENTOLIN HFA) 108 (90 Base) MCG/ACT inhaler Inhale 2 puffs into the lungs as needed for wheezing or shortness of breath. 07/31/22   Gerhard Munch, MD  carvedilol (COREG) 3.125 MG tablet Take 1 tablet (3.125 mg total) by mouth 2 (two) times daily with a meal. 08/14/22   Marrianne Mood, MD  empagliflozin (JARDIANCE) 10 MG TABS tablet Take 1 tablet (10 mg total) by mouth daily. 08/14/22   Marrianne Mood, MD  furosemide (LASIX) 40 MG tablet Take 1 tablet (40 mg total) by mouth daily. Take an additional tablet for shortness of breath, leg swelling, or weight gain of 3 lbs in a day or 5 lbs in a week. 08/14/22   Marrianne Mood, MD  losartan (COZAAR) 25 MG tablet Take 1 tablet (25 mg total) by mouth daily. 08/14/22 11/12/22  Marrianne Mood, MD  spironolactone (ALDACTONE) 25 MG tablet Take 1/2 tablet (12.5 mg total) by mouth daily. 08/14/22 11/12/22  Marrianne Mood, MD      Allergies    Egg-derived products, Banana, and Pork-derived products    Review of Systems   Review of  Systems  Constitutional:  Positive for chills, fatigue and fever.  HENT:  Positive for congestion.   Respiratory:  Positive for cough, sputum production and shortness of breath. Negative for chest tightness, wheezing and stridor.   Cardiovascular:  Positive for leg swelling. Negative for chest pain, palpitations and claudication.  Gastrointestinal:  Negative for abdominal pain, constipation, diarrhea, nausea and vomiting.  Genitourinary:  Negative for dysuria.  Musculoskeletal:  Negative for back pain, neck pain and neck stiffness.  Skin:  Negative for rash and wound.  Neurological:  Negative for headaches.  Psychiatric/Behavioral:  Negative for agitation.   All other systems reviewed and are negative.   Physical Exam Updated Vital Signs BP (!) 175/117   Pulse (!) 101   Temp 100.3 F (37.9 C) (Oral)   Resp (!) 22   Ht 5\' 6"  (1.676 m)   Wt 67.6 kg   SpO2 95%   BMI 24.05 kg/m  Physical Exam Vitals and nursing note reviewed.  Constitutional:      General: He is not in acute distress.    Appearance: He is well-developed. He is not ill-appearing, toxic-appearing or diaphoretic.  HENT:     Head: Normocephalic and atraumatic.  Eyes:     Conjunctiva/sclera: Conjunctivae normal.     Pupils: Pupils are equal, round, and reactive to light.  Cardiovascular:     Rate and Rhythm: Regular rhythm. Tachycardia present.  Pulses: Normal pulses.     Heart sounds: No murmur heard. Pulmonary:     Effort: Pulmonary effort is normal. Tachypnea present. No respiratory distress.     Breath sounds: Rhonchi and rales present. No wheezing.  Chest:     Chest wall: No tenderness.  Abdominal:     Palpations: Abdomen is soft.     Tenderness: There is no abdominal tenderness.  Musculoskeletal:        General: No swelling.     Cervical back: Neck supple.     Right lower leg: No tenderness. No edema.     Left lower leg: No tenderness. No edema.  Skin:    General: Skin is warm and dry.      Capillary Refill: Capillary refill takes less than 2 seconds.     Findings: No erythema.  Neurological:     General: No focal deficit present.     Mental Status: He is alert.  Psychiatric:        Mood and Affect: Mood normal.     ED Results / Procedures / Treatments   Labs (all labs ordered are listed, but only abnormal results are displayed) Labs Reviewed  CBC WITH DIFFERENTIAL/PLATELET - Abnormal; Notable for the following components:      Result Value   RBC 3.46 (*)    Hemoglobin 12.0 (*)    HCT 35.0 (*)    MCV 101.2 (*)    MCH 34.7 (*)    RDW 16.5 (*)    Platelets 148 (*)    Lymphs Abs 0.5 (*)    All other components within normal limits  BASIC METABOLIC PANEL - Abnormal; Notable for the following components:   Calcium 8.8 (*)    All other components within normal limits  BRAIN NATRIURETIC PEPTIDE - Abnormal; Notable for the following components:   B Natriuretic Peptide 1,157.2 (*)    All other components within normal limits  HEPATIC FUNCTION PANEL - Abnormal; Notable for the following components:   Total Protein 5.8 (*)    Albumin 2.6 (*)    All other components within normal limits  TROPONIN I (HIGH SENSITIVITY) - Abnormal; Notable for the following components:   Troponin I (High Sensitivity) 60 (*)    All other components within normal limits  TROPONIN I (HIGH SENSITIVITY) - Abnormal; Notable for the following components:   Troponin I (High Sensitivity) 46 (*)    All other components within normal limits  RESP PANEL BY RT-PCR (RSV, FLU A&B, COVID)  RVPGX2  RESPIRATORY PANEL BY PCR  CULTURE, BLOOD (ROUTINE X 2)  CULTURE, BLOOD (ROUTINE X 2)  LACTIC ACID, PLASMA  LACTIC ACID, PLASMA  STREP PNEUMONIAE URINARY ANTIGEN  PROCALCITONIN  LIPASE, BLOOD  LEGIONELLA PNEUMOPHILA SEROGP 1 UR AG  CBC  BASIC METABOLIC PANEL    EKG EKG Interpretation  Date/Time:  Tuesday September 03 2022 12:05:38 EDT Ventricular Rate:  96 PR Interval:  154 QRS Duration: 68 QT  Interval:  362 QTC Calculation: 457 R Axis:   8 Text Interpretation: Normal sinus rhythm Left ventricular hypertrophy with repolarization abnormality ( Sokolow-Lyon , Cornell product ) Abnormal ECG When compared with ECG of 13-Aug-2022 18:31, PREVIOUS ECG IS PRESENT when comapred to prior, previous t wave inversions improved. No STEMI Confirmed by Theda Belfast ([id number]) on 09/03/2022 3:38:53 PM  Radiology DG Chest 2 View  Result Date: 09/03/2022 CLINICAL DATA:  Pain EXAM: CHEST - 2 VIEW COMPARISON:  None Available. FINDINGS: Interval progression of hazy airspace opacities in the  right mid and lower lung fields, worrisome for infection. No pleural effusion. No pneumothorax. No radiographically apparent displaced rib fractures. Unchanged cardiac and mediastinal contours. Visualized upper abdomen is notable for embolization material in the left upper quadrant. IMPRESSION: Interval progression of hazy airspace opacities in the right mid and lower lung fields, worrisome for infection. Electronically Signed   By: Lorenza Cambridge M.D.   On: 09/03/2022 13:38   DG Hip Unilat W or Wo Pelvis 2-3 Views Right  Result Date: 09/03/2022 CLINICAL DATA:  Pain in right hip for a few days EXAM: DG HIP (WITH OR WITHOUT PELVIS) 3V RIGHT COMPARISON:  None Available. FINDINGS: There is no evidence of hip fracture or dislocation. There is no evidence of arthropathy or other focal bone abnormality. IMPRESSION: No acute osseous abnormality. Electronically Signed   By: Wiliam Ke M.D.   On: 09/03/2022 13:38    Procedures Procedures    CRITICAL CARE Performed by: Canary Brim Myron Stankovich Total critical care time: 30 minutes Critical care time was exclusive of separately billable procedures and treating other patients. Critical care was necessary to treat or prevent imminent or life-threatening deterioration. Critical care was time spent personally by me on the following activities: development of treatment plan with patient  and/or surrogate as well as nursing, discussions with consultants, evaluation of patient's response to treatment, examination of patient, obtaining history from patient or surrogate, ordering and performing treatments and interventions, ordering and review of laboratory studies, ordering and review of radiographic studies, pulse oximetry and re-evaluation of patient's condition.  Medications Ordered in ED Medications  acetaminophen (TYLENOL) tablet 650 mg (has no administration in time range)  cefTRIAXone (ROCEPHIN) 1 g in sodium chloride 0.9 % 100 mL IVPB (has no administration in time range)  azithromycin (ZITHROMAX) 500 mg in sodium chloride 0.9 % 250 mL IVPB (has no administration in time range)    ED Course/ Medical Decision Making/ A&P                             Medical Decision Making Amount and/or Complexity of Data Reviewed Labs: ordered.  Risk Decision regarding hospitalization.    Charles [name] is a 52 y.o. male with a past medical history significant for CKD, polysubstance abuse, hypertension, CHF with recent admission for heart failure several weeks ago, hepatitis C, homelessness, seizures, and COPD who presents with fevers, chills, productive cough, short of breath, and bilateral leg edema, and right hip pain.  According to patient, he was admitted to the hospital about 2 weeks ago and has not taken any medicine since discharge told first evaluation/triage team that he has been continue to use crack cocaine and has had shortness of breath and productive cough recently.  He was found to be near febrile with a temperature 100.3 orally, tachycardic, tachypneic, and had oxygen saturations in the 90s on arrival.  Upon my first evaluation of the patient, oxygen saturation is 86% on room air and he is short of breath.  He reports has had some edema in his legs and had some right hip pain.  He is denying any abdominal pain, nausea, or vomiting.  Denies any constipation, diarrhea, or  urinary changes.  He is denying any chest pain at this time, just to be short of breath and cough.  On exam, patient has rales and coarseness in his lungs.  He had minimal edema in his legs on my exam but he reports that they  are swollen for him.  He did have pulses intact.  Patient has dried emesis on his beard but chest and abdomen were nontender.  Given patient's report of productive cough and his concerning vital signs, I am concerned about pneumonia.  X-ray was obtained showing worsened opacities concerning for infection/pneumonia.  He is now on 3 L nasal cannula to maintain oxygen saturations in the 90s.  Will get BNP and will hold on flooding him with fluids at this time due to his recent heart failure admission and not being on his medications.  Will get BNP and other labs as well.  Will order blood cultures.  He was negative for COVID/flu/RSV and his metabolic panel was overall reassuring aside from mild hypocalcemia.  He does not have leukocytosis and his troponin was elevated at 60 but actually is improved from prior.,  Will trend.  Although he initially denied vomiting, he had vomit clearly on his face and torso, will get hepatic function and lipase added as well.   X-ray was obtained in triage and did not show evidence of hip abnormality or fracture.  Will call for admission for pneumonia with new hypoxia.           Final Clinical Impression(s) / ED Diagnoses Final diagnoses:  Hypoxia  SOB (shortness of breath)  Community acquired pneumonia, unspecified laterality     Clinical Impression: 1. Hypoxia   2. SOB (shortness of breath)   3. Community acquired pneumonia, unspecified laterality     Disposition: Admit  This note was prepared with assistance of Conservation officer, historic buildings. Occasional wrong-word or sound-a-like substitutions may have occurred due to the inherent limitations of voice recognition software.     Darrian Goodwill, Canary Brim, MD 09/03/22  2350

## 2022-09-03 NOTE — ED Notes (Signed)
Last cocaine use was yesterday

## 2022-09-03 NOTE — ED Provider Triage Note (Signed)
Emergency Medicine Provider Triage Evaluation Note  Charles [name] , a 52 y.o. male  was evaluated in triage.  Patient coming from the [facility].  He reports that he woke up outside and was very short of breath.  Said that he also was having some coughing.  Now endorsing pain all over his body but mostly to his right hip.  Borderline febrile, tachycardic and tachypneic.  Will go ahead and order labs.  Last crack use yesterday  Review of Systems  Positive:  Negative:   Physical Exam  BP (!) 175/117   Pulse (!) 101   Temp 100.3 F (37.9 C) (Oral)   Resp (!) 22   Ht  (1.676 m)   Wt 67.6 kg   SpO2 95%   BMI 24.05 kg/m  Gen:   Awake, no distress   Resp:  Normal effort  MSK:   Moves extremities without difficulty  Other:  Ambulatory, sinus tach  Medical Decision Making  Medically screening exam initiated at 1:01 PM.  Appropriate orders placed.  Charles [name] was informed that the remainder of the evaluation will be completed by another provider, this initial triage assessment does not replace that evaluation, and the importance of remaining in the ED until their evaluation is complete.     Saddie Benders, PA-C 09/03/22 1301

## 2022-09-03 NOTE — ED Triage Notes (Signed)
BIB EMS for SOB that started 1 hour ago. Patient coming from [facility].  Complains leg swelling R>L.  Reports has not had any meds x 2 weeks.  BP 174/120 HR 106 RR 22 95%RA 18g LFA
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
## 2022-09-04 DIAGNOSIS — F1490 Cocaine use, unspecified, uncomplicated: Secondary | ICD-10-CM | POA: Diagnosis present

## 2022-09-04 DIAGNOSIS — Z79899 Other long term (current) drug therapy: Secondary | ICD-10-CM | POA: Diagnosis not present

## 2022-09-04 DIAGNOSIS — E538 Deficiency of other specified B group vitamins: Secondary | ICD-10-CM | POA: Diagnosis present

## 2022-09-04 DIAGNOSIS — Z7984 Long term (current) use of oral hypoglycemic drugs: Secondary | ICD-10-CM | POA: Diagnosis not present

## 2022-09-04 DIAGNOSIS — I110 Hypertensive heart disease with heart failure: Secondary | ICD-10-CM | POA: Diagnosis not present

## 2022-09-04 DIAGNOSIS — N182 Chronic kidney disease, stage 2 (mild): Secondary | ICD-10-CM | POA: Diagnosis present

## 2022-09-04 DIAGNOSIS — J9601 Acute respiratory failure with hypoxia: Secondary | ICD-10-CM

## 2022-09-04 DIAGNOSIS — Z1152 Encounter for screening for COVID-19: Secondary | ICD-10-CM | POA: Diagnosis not present

## 2022-09-04 DIAGNOSIS — M7989 Other specified soft tissue disorders: Secondary | ICD-10-CM | POA: Diagnosis present

## 2022-09-04 DIAGNOSIS — Z91012 Allergy to eggs: Secondary | ICD-10-CM | POA: Diagnosis not present

## 2022-09-04 DIAGNOSIS — M25561 Pain in right knee: Secondary | ICD-10-CM | POA: Diagnosis present

## 2022-09-04 DIAGNOSIS — I5020 Unspecified systolic (congestive) heart failure: Secondary | ICD-10-CM

## 2022-09-04 DIAGNOSIS — I130 Hypertensive heart and chronic kidney disease with heart failure and stage 1 through stage 4 chronic kidney disease, or unspecified chronic kidney disease: Secondary | ICD-10-CM | POA: Diagnosis present

## 2022-09-04 DIAGNOSIS — F17290 Nicotine dependence, other tobacco product, uncomplicated: Secondary | ICD-10-CM | POA: Diagnosis present

## 2022-09-04 DIAGNOSIS — Z91018 Allergy to other foods: Secondary | ICD-10-CM | POA: Diagnosis not present

## 2022-09-04 DIAGNOSIS — B182 Chronic viral hepatitis C: Secondary | ICD-10-CM | POA: Diagnosis present

## 2022-09-04 DIAGNOSIS — G8929 Other chronic pain: Secondary | ICD-10-CM | POA: Diagnosis present

## 2022-09-04 DIAGNOSIS — J440 Chronic obstructive pulmonary disease with acute lower respiratory infection: Secondary | ICD-10-CM | POA: Diagnosis present

## 2022-09-04 DIAGNOSIS — M25572 Pain in left ankle and joints of left foot: Secondary | ICD-10-CM | POA: Diagnosis present

## 2022-09-04 DIAGNOSIS — Z5900 Homelessness unspecified: Secondary | ICD-10-CM | POA: Diagnosis not present

## 2022-09-04 DIAGNOSIS — F17210 Nicotine dependence, cigarettes, uncomplicated: Secondary | ICD-10-CM

## 2022-09-04 DIAGNOSIS — J189 Pneumonia, unspecified organism: Secondary | ICD-10-CM | POA: Diagnosis present

## 2022-09-04 DIAGNOSIS — R0602 Shortness of breath: Secondary | ICD-10-CM | POA: Diagnosis present

## 2022-09-04 DIAGNOSIS — I5023 Acute on chronic systolic (congestive) heart failure: Secondary | ICD-10-CM | POA: Diagnosis present

## 2022-09-04 DIAGNOSIS — Z91014 Allergy to mammalian meats: Secondary | ICD-10-CM | POA: Diagnosis not present

## 2022-09-04 LAB — CBC
HCT: 37.1 % — ABNORMAL LOW (ref 39.0–52.0)
Hemoglobin: 12.6 g/dL — ABNORMAL LOW (ref 13.0–17.0)
MCH: 34.6 pg — ABNORMAL HIGH (ref 26.0–34.0)
MCHC: 34 g/dL (ref 30.0–36.0)
MCV: 101.9 fL — ABNORMAL HIGH (ref 80.0–100.0)
Platelets: 137 10*3/uL — ABNORMAL LOW (ref 150–400)
RBC: 3.64 MIL/uL — ABNORMAL LOW (ref 4.22–5.81)
RDW: 16.2 % — ABNORMAL HIGH (ref 11.5–15.5)
WBC: 6 10*3/uL (ref 4.0–10.5)
nRBC: 0 % (ref 0.0–0.2)

## 2022-09-04 LAB — BASIC METABOLIC PANEL
Anion gap: 8 (ref 5–15)
BUN: 17 mg/dL (ref 6–20)
CO2: 27 mmol/L (ref 22–32)
Calcium: 8.9 mg/dL (ref 8.9–10.3)
Chloride: 101 mmol/L (ref 98–111)
Creatinine, Ser: 1.32 mg/dL — ABNORMAL HIGH (ref 0.61–1.24)
GFR, Estimated: >60 mL/min (ref >60)
Glucose, Bld: 168 mg/dL — ABNORMAL HIGH (ref 70–99)
Potassium: 3.7 mmol/L (ref 3.5–5.1)
Sodium: 136 mmol/L (ref 135–145)

## 2022-09-04 LAB — VITAMIN B12: Vitamin B-12: 265 pg/mL (ref 180–914)

## 2022-09-04 LAB — FOLATE: Folate: 13.5 ng/mL (ref >5.9)

## 2022-09-04 LAB — CULTURE, BLOOD (ROUTINE X 2)

## 2022-09-04 MED ORDER — AZITHROMYCIN 500 MG PO TABS
500.0000 mg | ORAL_TABLET | Freq: Every day | ORAL | Status: DC
  Administered 2022-09-05: 500 mg via ORAL
  Filled 2022-09-04 (×2): qty 1

## 2022-09-04 MED ORDER — DICLOFENAC SODIUM 1 % EX GEL
2.0000 g | Freq: Four times a day (QID) | CUTANEOUS | Status: DC
  Administered 2022-09-04: 2 g via TOPICAL
  Filled 2022-09-04: qty 100

## 2022-09-04 MED ORDER — LOSARTAN POTASSIUM 50 MG PO TABS
25.0000 mg | ORAL_TABLET | Freq: Every day | ORAL | Status: DC
  Administered 2022-09-04 – 2022-09-05 (×2): 25 mg via ORAL
  Filled 2022-09-04 (×2): qty 1

## 2022-09-04 MED ORDER — LIDOCAINE 5 % EX PTCH
1.0000 | MEDICATED_PATCH | CUTANEOUS | Status: DC
  Administered 2022-09-04 – 2022-09-05 (×2): 1 via TRANSDERMAL
  Filled 2022-09-04 (×2): qty 1

## 2022-09-04 NOTE — Progress Notes (Signed)
PHARMACIST - PHYSICIAN COMMUNICATION DR:   Clearance Coots CONCERNING: Antibiotic IV to Oral Route Change Policy  RECOMMENDATION: This patient is receiving azithromycin by the intravenous route.  Based on criteria approved by the Pharmacy and Therapeutics Committee, the antibiotic(s) is/are being converted to the equivalent oral dose form(s).   DESCRIPTION: These criteria include: Patient being treated for a respiratory tract infection, urinary tract infection, cellulitis or clostridium difficile associated diarrhea if on metronidazole The patient is not neutropenic and does not exhibit a GI malabsorption state The patient is eating (either orally or via tube) and/or has been taking other orally administered medications for a least 24 hours The patient is improving clinically and has a Tmax < 100.5  If you have questions about this conversion, please contact the Pharmacy Department    [phone] )  [facility]   [phone] )  [facility]    [phone] )  [facility]   [phone] )  [facility]   Rexford Maus, PharmD, BCPS 09/04/2022 2:09 PM

## 2022-09-04 NOTE — Progress Notes (Signed)
Heart Failure Navigator Progress Note  Assessed for Heart & Vascular TOC clinic readiness.  Patient does not meet criteria due to established with [facility].   Emri Sample Boothby, PharmD, BCPS Heart Failure Stewardship Pharmacist Phone [phone]  

## 2022-09-04 NOTE — Progress Notes (Addendum)
Internal Medicine Attending:   I saw and examined the patient. I reviewed the resident's note and I agree with the resident's findings and plan as documented in the resident's note.  In brief, patient is a 52 year old male with a past medical history of hypertension, CKD stage II, COPD, chronic systolic heart failure with an EF of 30 to 35%, polysubstance use and hepatitis C who presented to the ED with progressively worsening shortness of breath over the last couple of days.  Patient was recently mid to the hospital approximately 3 weeks ago for an acute on chronic heart failure exacerbation and was taking his medications at the time of discharge but stopped taking them approximately 2 weeks ago when he left his medications at his niece's house.  Approximately 3 days ago start developing worsening shortness of breath as well as some lower extremity swelling and cough productive of clear sputum.  His symptoms progressed over the last couple of days and he came to the ED for further evaluation.  He does state that his last cocaine use was the day prior to his admission.  Today, patient states that his shortness of breath is improved but still complains of pain over his right hip.  On exam, patient is lying in bed in no apparent distress.  Cardiovascular exam reveals regular rate and rhythm with normal heart sounds.  Lungs are clear to auscultation bilaterally.  Abdomen is soft, nontender, nondistended with normoactive bowel sounds.  Lower extremities show trace edema bilaterally but are nontender to palpation.  Patient has a normal mood and affect.  Patient is oriented x 3 with no focal deficit noted.  Assessment and plan:  1.  Acute hypoxic respiratory failure: Patient presented to the ED with progressive worsening shortness of breath and was noted by the ED physician to have O2 sats in the 80s on room air (per his note).  He was noted to have a low-grade fever (100.3 F) as well as worsening infiltrates  in his right middle and lower lung and an elevated procalcitonin of 0.7 concerning for pneumonia.  Given his history of polysubstance use I suspect this may be secondary to aspiration pneumonia.  He was also noted to have lower extremity edema on admission and has been off his heart failure medications over the last couple of weeks and had an elevated BNP of 1157 consistent with an acute on chronic heart failure exacerbation.  Thus the etiology of his acute hypoxic respiratory failure is likely multifactorial secondary to community-acquired pneumonia and mild acute on chronic systolic heart failure exacerbation: -Currently, patient's O2 sats are in the 90s on room air and his shortness of breath has improved -Patient respiratory viral panel was negative and his blood cultures show no growth to date -Will continue ceftriaxone and azithromycin for now.  Will transition to oral antibiotics on discharge to complete a 5-day course -Will continue strict I's and O's and daily weights.  Patient is net negative approximately 2 L over the last 24 hours.  Will continue with his current heart failure medications including empagliflozin, Lasix 40 mg daily, losartan 25 mg daily and spironolactone 12.5 mg daily -Will resume his beta-blocker on discharge.  This was held secondary to him being off his heart failure medications over the last 2 weeks and possibility of an acute on chronic heart failure exacerbation as well as recent cocaine use.  However, patient is on nonselective beta-blocker and we will continue this on discharge despite his recent cocaine use -Of note,  patient's creatinine has mildly increased to 1.3 but this appears to be at his baseline.  Will continue to monitor closely -No further workup at this time -I suspect patient should be stable for DC home either later today or tomorrow

## 2022-09-04 NOTE — Progress Notes (Signed)
Subjective:  Charles [name] was feeling okay this morning, described some improvement in his breathing compared to last night. Reported some chronic leg hip and knee pain. Discussed possible causes of current presentation including pneumonia, lung disease, and heart failure exacerbation. Counseled on importance of medication adherence. Patient expressed understanding and all of his questions were answered.    Objective: Vitals over previous 24hr: Vitals:   09/03/22 1830 09/03/22 1900 09/03/22 2204 09/04/22 0335  BP: (!) 137/108 (!) 146/92 (!) 162/98 (!) 147/110  Pulse: (!) 108 99 87 85  Resp: (!) Temp:   98.8 F (37.1 C) 99.7 F (37.6 C)  TempSrc:   Oral Oral  SpO2: 96% 95% 99% 97%  Weight:      Height:        General:                       awake and alert, lying in bed, restless particularly RLE, cooperative, not in acute distress Skin:                             warm and dry, multiple well-healed 1-5cm plaques with overlying eschar on right abdomen-flank Eyes:                            extraocular movements intact, pupils round Lungs:                          normal respiratory effort, breathing unlabored, symmetrical chest rise, coarse breath sounds more prominent in right fields, no crackles or wheezing  Cardiac:                        regular rate and rhythm, normal S1 and S2, capillary refill ~2 seconds, trace pitting edema RLE>LLE Abdomen:                     soft and non-distended, hyperactive bowel sounds, no tenderness to palpation or guarding Neurologic:                   oriented to person-place-time, moving all extremities, no gross focal deficits Psychiatric:                   euthymic mood with congruent affect, slight psychomotor agitation, intelligible speech    Assessment/Plan: Charles [name] is a 52 y.o. person living with a history of hypertension, CKD II, ~COPD, CHF, polysubstance use, and hepatitis C who presents with breath shortness, now  admitted for management of hypoxic respiratory failure and pneumonia.     ---Multifactorial acute hypoxic respiratory failure ---Suspected chronic obstructive pulmonary disease Patient has history of COPD without documented pulmonary function tests, managed at home with albuterol as needed. Presented on 4-23 with worsening breath shortness and cough productive of clear sputum. Upon arrival, patient afebrile and required 3L/min of supplemental oxygen. Chest radiograph revealed interval progression of right middle-lower opacities compared to prior study performed 3-30. Exam notable for coarse breath sounds without wheezing. Procalcitonin mildly elevated and respiratory viral panel negative. Etiology likely multifactorial including probable infection worsening baseline pulmonary function. Given history of polysubstance use and right lower lobe involvement, patient may have aspirated. Low concern for pulmonary embolism. Empiric treatment with ceftriaxone and azithromycin was started on admission. Plan to  complete five-day antibiotic course, last doses on 4-27.  > Ceftriaxone 1g q24  > Azithromycin  q24  > Ipratropium-albuterol 0.5-2.5mg  q6 PRN  > Albuterol 108ug q4 PRN   > Supplemental oxygen to maintain SpO2>92  > Check L.pneumophila urinary antigens  > Follow blood cultures, no growth day 1     ---Heart failure reduced ejection fraction  Patient has history of heart failure reduced ejection fraction, most recent echocardiogram performed 07-2022 demonstrated EF 30-35 and global LV hypokinesis. Recently hospitalized at [facility] on 3-30 for acute heart failure exacerbation. Discharged with medication regimen of carvedilol, spironolactone, losartan, and empagliflozin. Initially took these medications daily, but then self-discontinued about one week ago. Presented on 4-23 with progressive breath shortness and lower extremity swelling. Upon arrival, laboratory testing demonstrated BNP 1157 around baseline and  stable Trop levels. Exam notable for pitting edema without crackles or JVD. Weight at prior discharge was 67.7kg, currently 67.6kg. Moderate concern for mild acute heart failure exacerbation given recent history of medication non-adherence. Home empagliflozin, spironolactone, losartan, and furosemide were resumed after admission. Exam on hospitalization day two demonstrated improvement in lower extremity edema and net urine output has been 2.0L. Plan to hold carvedilol until discharge in setting of possible heart failure exacerbation.  > Empagliflozin  q24  > Spironolactone 12.5mg  q24  > Furosemide  q24  > Losartan  q24  > Monitor ins-outs  > Hold carvedilol given possible heart failure exacerbation     ---Hypertension Patient has history of hypertension. Home medications include spironolactone, losartan, and furosemide. Reports daily adherence since recent discharge on 4-3, until about one week ago when he stopped taking these medications abruptly. Upon arrival, BP 175/117 and has remained in the elevated range. Home spironolactone, furosemide, and losartan were resumed after admission. Plan to hold carvedilol until discharge given possible heart failure exacerbation. Nonselective beta-blockers are good antihypertensive option for a patient who frequently uses cocaine.   > Spironolactone 12.5mg  q24  > Furosemide  q24  > Losartan  q24  > Hold carvedilol given possible heart failure exacerbation     ---Chronic hip and knee pain Patient reports longstanding pain in his left ankle and right knee that he attributes to traumatic childhood injury. Manages and self-medicates the pain at home with frequent cocaine use. Suboxone therapy was attempted during recent hospitalization, but patient started to experience unwanted side effects. Upon arrival, reproducible pain present over his right greater trochanter. Hip radiograph negative for fracture and dislocation. Etiology possibly  trochanteric bursitis and ipsilateral knee osteoarthritis.   > Diclofenac 1% topical q6  > Lidocaine 5% patch q24  > Acetaminophen  q6 PRN     ---Polysubstance including cocaine use ---Homelessness Patient is currently homeless and reports using multiple substances including cocaine daily. Smokes about 1ppd of cigarettes and rarely drinks alcohol. Denies using opioids and injected drugs. After prior discharge on 4-3, the patient reports using cocaine frequently including the day before current hospitalization. Extensive counseling on importance of cessation has been provided in the past and present.  > Encourage cocaine and tobacco cessation     ---Chronic hepatitis C infection Patient has history of chronic hepatitis C infection, most recent viral load collected 07-2022 was 6.97x106. Vaccinations for hepatitis A and B were administered during recent hospitalization. At that time, he was instructed to establish with infectious disease as outpatient but never followed up.  > Follow up with infectious disease as outpatient    ---Macrocytic anemia  Upon arrival, laboratory testing demonstrated Hgb 12.6 and  MCV 102. Given low Alb and normal AST-ALT levels, etiology likely malnutrition specifically folate or cyanocobalamin deficiency.  > Check vitamins B9 and B12        Principal Problem:   Acute hypoxic respiratory failure    Prior to Admission Living Arrangement: homeless Anticipated Discharge Location: [place] Barriers to Discharge: symptomatic improvement Dispo: Anticipated discharge in approximately 1 day(s).    Lajuana Ripple, MD Internal Medicine PGY-1 Pager [phone]  After 5pm on weekdays and 1pm on weekends: On Call pager [phone]

## 2022-09-04 NOTE — Hospital Course (Signed)
4/24: still having hip pain over greater trochanter. Stomach hurts because he is eating a lot of ice cream. He is lactose intolerant.   4/25: says he doesn't feel good, has a stomach ache but ate ice cream again. Hasn't taken tylenol says someone said it's bad for his blood, voltaren last night but he still has pain. Lidocaine patch did help. Complaining about fluid in his feet today. Says he is not going to go to any HFU visit. Knows he needs a doctor so he can get his medicine but still isn't going to go to a dr appt.

## 2022-09-05 ENCOUNTER — Other Ambulatory Visit (HOSPITAL_COMMUNITY): Payer: Self-pay

## 2022-09-05 DIAGNOSIS — J9601 Acute respiratory failure with hypoxia: Secondary | ICD-10-CM | POA: Diagnosis not present

## 2022-09-05 DIAGNOSIS — F17210 Nicotine dependence, cigarettes, uncomplicated: Secondary | ICD-10-CM | POA: Diagnosis not present

## 2022-09-05 LAB — CBC
HCT: 43 % (ref 39.0–52.0)
Hemoglobin: 14.3 g/dL (ref 13.0–17.0)
MCH: 34 pg (ref 26.0–34.0)
MCHC: 33.3 g/dL (ref 30.0–36.0)
MCV: 102.1 fL — ABNORMAL HIGH (ref 80.0–100.0)
Platelets: 157 10*3/uL (ref 150–400)
RBC: 4.21 MIL/uL — ABNORMAL LOW (ref 4.22–5.81)
RDW: 16 % — ABNORMAL HIGH (ref 11.5–15.5)
WBC: 4.2 10*3/uL (ref 4.0–10.5)
nRBC: 0 % (ref 0.0–0.2)

## 2022-09-05 LAB — BASIC METABOLIC PANEL
Anion gap: 8 (ref 5–15)
BUN: 22 mg/dL — ABNORMAL HIGH (ref 6–20)
CO2: 27 mmol/L (ref 22–32)
Calcium: 9.3 mg/dL (ref 8.9–10.3)
Chloride: 101 mmol/L (ref 98–111)
Creatinine, Ser: 1.24 mg/dL (ref 0.61–1.24)
GFR, Estimated: >60 mL/min (ref >60)
Glucose, Bld: 107 mg/dL — ABNORMAL HIGH (ref 70–99)
Potassium: 4.3 mmol/L (ref 3.5–5.1)
Sodium: 136 mmol/L (ref 135–145)

## 2022-09-05 LAB — CULTURE, BLOOD (ROUTINE X 2): Culture: NO GROWTH

## 2022-09-05 LAB — LEGIONELLA PNEUMOPHILA SEROGP 1 UR AG: L. pneumophila Serogp 1 Ur Ag: NEGATIVE

## 2022-09-05 MED ORDER — AZITHROMYCIN 500 MG PO TABS
500.0000 mg | ORAL_TABLET | Freq: Every day | ORAL | 0 refills | Status: DC
  Filled 2022-09-05: qty 2, 2d supply, fill #0

## 2022-09-05 MED ORDER — CEFDINIR 300 MG PO CAPS
300.0000 mg | ORAL_CAPSULE | Freq: Two times a day (BID) | ORAL | Status: DC
  Administered 2022-09-05: 300 mg via ORAL
  Filled 2022-09-05: qty 1

## 2022-09-05 MED ORDER — CEFDINIR 300 MG PO CAPS
300.0000 mg | ORAL_CAPSULE | Freq: Two times a day (BID) | ORAL | 0 refills | Status: DC
  Filled 2022-09-05: qty 4, 2d supply, fill #0

## 2022-09-05 MED ORDER — LIDOCAINE 5 % EX PTCH
1.0000 | MEDICATED_PATCH | CUTANEOUS | 0 refills | Status: DC
  Filled 2022-09-05: qty 30, 30d supply, fill #0

## 2022-09-05 NOTE — Discharge Summary (Signed)
Name: Charles [name] MRN: [id number] DOB: [birth year] 52 y.o. PCP: Renaye Rakers, MD  Date of Admission: 09/03/2022 12:18 PM Date of Discharge: 09-05-2022 Attending Physician: Charles Lagos, MD  Discharge Diagnosis: Principal Problem:   Acute hypoxic respiratory failure     Discharge Medications: Allergies as of 09/05/2022       Reactions   Egg-derived Products Nausea And Vomiting   Banana Nausea And Vomiting   Pork-derived Products Nausea And Vomiting        Medication List     TAKE these medications    albuterol 108 (90 Base) MCG/ACT inhaler Commonly known as: VENTOLIN HFA Inhale 2 puffs into the lungs as needed for wheezing or shortness of breath.   azithromycin 500 MG tablet Commonly known as: ZITHROMAX Take 1 tablet (500 mg total) by mouth daily. Start taking on: September 06, 2022   carvedilol 3.125 MG tablet Commonly known as: COREG Take 1 tablet (3.125 mg total) by mouth 2 (two) times daily with a meal.   cefdinir 300 MG capsule Commonly known as: OMNICEF Take 1 capsule (300 mg total) by mouth every 12 (twelve) hours.   empagliflozin 10 MG Tabs tablet Commonly known as: JARDIANCE Take 1 tablet (10 mg total) by mouth daily.   furosemide 40 MG tablet Commonly known as: LASIX Take 1 tablet (40 mg total) by mouth daily. Take an additional tablet for shortness of breath, leg swelling, or weight gain of 3 lbs in a day or 5 lbs in a week.   lidocaine 5 % Commonly known as: LIDODERM Place 1 patch onto the skin daily. Remove & Discard patch within 12 hours or as directed by MD   losartan 25 MG tablet Commonly known as: COZAAR Take 1 tablet (25 mg total) by mouth daily.   spironolactone 25 MG tablet Commonly known as: ALDACTONE Take 1/2 tablet (12.5 mg total) by mouth daily.         Disposition and follow-up:    Mr.Charles [name] was discharged from [facility] in Stable condition.  At the hospital follow up visit please  address:   1.  Community acquired pneumonia: ensure resolution of symptoms including breath shortness, confirm completion of cefdinir and azithromycin course last doses 4-27   2.  Heart failure: assess volume status including lower extremity swelling, confirm adherence to heart failure medications including furosemide, encourage regular follow-up  3.  Hypertension: check blood pressure and adherence to medications including carvedilol, encourage regular follow-up  4.  Labs / imaging needed at time of follow-up: none  5.  Pending labs / tests needing follow-up: none    Follow-up Appointments:  Recommend scheduling visit with Internal Medicine Clinic by calling: [phone] Course by problem list:  Charles [name] is a 52 y.o. person living with a history of hypertension, CKD II, ~COPD, CHF, polysubstance use, and hepatitis C who presents with breath shortness, admitted for management of hypoxic respiratory failure and pneumonia.     ---Multifactorial acute hypoxic respiratory failure ---Suspected chronic obstructive pulmonary disease Patient has history of COPD without documented pulmonary function tests, managed at home with albuterol as needed. Presented on 4-23 with worsening breath shortness and cough productive of clear sputum. Upon arrival, patient afebrile and required 3L/min of supplemental oxygen. Chest radiograph revealed interval progression of right middle-lower opacities compared to prior study performed 3-30. Exam notable for coarse breath sounds without wheezing. Procalcitonin mildly elevated and respiratory viral panel negative. Etiology multifactorial including  community acquired pneumonia worsening baseline pulmonary function. Given history of polysubstance use and right lower lobe involvement, patient may have aspirated. Empiric treatment with ceftriaxone and azithromycin started on admission. Symptoms improved after antibiotic therapy. Discharged with  ceftriaxone and azithromycin, last doses on 4-27.     ---Heart failure reduced ejection fraction  Patient has history of heart failure reduced ejection fraction, most recent echocardiogram performed 07-2022 demonstrated EF 30-35 and global LV hypokinesis. Recently hospitalized at [facility] on 3-30 for acute heart failure exacerbation. Discharged with medication regimen of carvedilol, spironolactone, losartan, and empagliflozin. Initially took these medications daily, but then self-discontinued about one week ago. Presented on 4-23 with progressive breath shortness and lower extremity swelling. Upon arrival, laboratory testing demonstrated BNP 1157 around baseline and stable Trop levels. Exam notable for pitting edema without crackles or JVD. Weight at prior discharge was 67.7kg and 67.6kg on admission. Mild heart failure exacerbation likely contributing to symptoms. Home empagliflozin, spironolactone, losartan, and furosemide were resumed. Lower extremity swelling improved with net urine output 3.9L. Carvedilol held throughout hospitalization and resumed upon discharge, along with other home medications.   ---Hypertension Patient has history of hypertension. Home medications include spironolactone, losartan, and furosemide. Reports daily adherence since recent discharge on 4-3, until about one week ago when he stopped taking these medications abruptly. Upon arrival, BP 175/117 and then remained in the elevated range. Home spironolactone, furosemide, and losartan were resumed with improvement in BP. Carvedilol held until discharge given heart failure exacerbation. Nonselective beta-blockers are good antihypertensive option for a patient who frequently uses cocaine.     ---Chronic hip and knee pain Patient reports longstanding pain in his left ankle and right knee that he attributes to traumatic childhood injury. Manages and self-medicates the pain at home with frequent cocaine use. Suboxone therapy was attempted  during recent hospitalization, but patient started to experience unwanted side effects. Upon arrival, reproducible pain present over his right greater trochanter. Hip radiograph negative for fracture and dislocation. Etiology possibly trochanteric bursitis and ipsilateral knee osteoarthritis. Discharged with lidocaine patch, from which patient reports symptomatic improvement.    ---Polysubstance including cocaine use ---Homelessness Patient is currently homeless and reports using multiple substances including cocaine daily. Smokes about 1ppd of cigarettes and rarely drinks alcohol. Denies using opioids and injected drugs. After prior discharge on 4-3, the patient reports using cocaine frequently including the day before admission. Extensive counseling on importance of cessation has been provided in the past and present.    ---Chronic hepatitis C infection Patient has history of chronic hepatitis C infection, most recent viral load collected 07-2022 was 6.97x106. Vaccinations for hepatitis A and B were administered during recent hospitalization. At that time, he was instructed to establish with infectious disease as outpatient but never followed up. Strong recommendation to follow up with infectious disease as outpatient     Discharge Exam:    BP (!) 152/98 (BP Location: Left Arm)   Pulse 93   Temp 97.8 F (36.6 C)   Resp 20   Ht 5\' 6"  (1.676 m)   Wt 67.6 kg   SpO2 99%   BMI 24.05 kg/m   Subjective: Mr Tuohey reports feeling good this morning, breathing has improved and is now back to normal. Reports chronic pain in right hip and knee, lidocaine patch provided modest relief. Stomach was mildly uncomfortable, which happens when he consumes ice cream. Discussed importance of medication adherence and outpatient follow-up. Patient stated that he would miss any future appointments, but provided contact information for  niece.   General:                       awake and alert, lying in bed,  restless and fidgeting particularly RLE, cooperative, not in acute distress Skin:                             warm and dry, multiple well-healed 1-5cm plaques with overlying eschar on right abdomen-flank Eyes:                            extraocular movements intact, pupils round Lungs:                          normal respiratory effort, breathing unlabored, symmetrical chest rise, no crackles or wheezing  Cardiac:                        regular rate and rhythm, normal S1 and S2, capillary refill ~1 second, no pitting edema  Abdomen:                     soft and non-distended, hyperactive bowel sounds, mild tenderness to palpation, no rigidity or guarding Neurologic:                   oriented to person-place-time, moving all extremities, no gross focal deficits Psychiatric:                   euthymic mood with congruent affect, slight psychomotor agitation, intelligible speech   Pertinent Labs, Studies, and Procedures:   Labs:    Latest Ref Rng & Units 09/05/2022    6:23 AM 09/04/2022    4:15 AM 09/03/2022   12:52 PM  CBC  WBC 4.0 - 10.5 K/uL 4.2  6.0  5.6   Hemoglobin 13.0 - 17.0 g/dL 56.2  13.0  86.5   Hematocrit 39.0 - 52.0 % 43.0  37.1  35.0   Platelets 150 - 400 K/uL 157  137  148       Latest Ref Rng & Units 09/05/2022    6:23 AM 09/04/2022    4:15 AM 09/03/2022    5:50 PM  CMP  Glucose 70 - 99 mg/dL 784  696    BUN 6 - 20 mg/dL 22  17    Creatinine 2.95 - 1.24 mg/dL 2.84  1.32    Sodium 440 - 145 mmol/L 136  136    Potassium 3.5 - 5.1 mmol/L 4.3  3.7    Chloride 98 - 111 mmol/L 101  101    CO2 22 - 32 mmol/L 27  27    Calcium 8.9 - 10.3 mg/dL 9.3  8.9    Total Protein 6.5 - 8.1 g/dL   5.8   Total Bilirubin 0.3 - 1.2 mg/dL   0.5   Alkaline Phos 38 - 126 U/L   55   AST 15 - 41 U/L   25   ALT 0 - 44 U/L   18    ______________________  Imaging:  DG Chest 2 View Result Date: 09/03/2022 IMPRESSION: Interval progression of hazy airspace opacities in the right mid and  lower lung fields, worrisome for infection.  DG Hip Unilat W or Wo Pelvis 2-3 Views Right Result Date: 09/03/2022 IMPRESSION: No acute osseous abnormality.  ______________________  Procedures:  none  ______________________   Discharge Instructions:  Discharge Instructions     Diet - low sodium heart healthy   Complete by: As directed    Discharge instructions   Complete by: As directed    Mr Lannen,  It was a pleasure taking care of you while you were in the hospital. Your breath shortness was caused by a combination of several factors including pneumonia, heart failure, lung disease, and cocaine use.  We treated the pneumonia with antibiotics, which you will continue to take for two more days. Additionally, we resumed your home medications. It is very important that you take these medications daily to prevent another hospitalization. All of these medications will help your heart failure and reduce the swelling in your legs.   After you leave, we strongly recommend scheduling an appointment with our internal medicine clinic. We can help monitor your health and refill your medications when you run out of them. You can reach [place] at [phone].   Increase activity slowly   Complete by: As directed      Mr Crisostomo,  It was a pleasure taking care of you while you were in the hospital. Your breath shortness was caused by a combination of several factors including pneumonia, heart failure, lung disease, and cocaine use.  We treated the pneumonia with antibiotics, which you will continue to take for two more days. Additionally, we resumed your home medications. It is very important that you take these medications daily to prevent another hospitalization. All of these medications will help your heart failure and reduce the swelling in your legs.   After you leave, we strongly recommend scheduling an appointment with our internal medicine clinic. We can help monitor your health and  refill your medications when you run out of them. You can reach [place] at [phone].   All the best,  Signed: Lajuana Ripple, MD Internal Medicine PGY-1 Pager [phone]

## 2022-09-05 NOTE — TOC Transition Note (Signed)
Transition of Care [facility]) - CM/SW Discharge Note   Patient Details  Name: Charles [name] MRN: [id number] Date of Birth: Jan 27, 1971  [facility] CM/SW Contact:  Janae Bridgeman, RN Phone Number: 09/05/2022, 12:59 PM   Clinical Narrative:    CM met with the patient at the bedside and patient is medically stable to return to the [facility] today.  The patient is homeless and states that he is not planning on going to his niece's home.  The patient has been homeless for 5 years and receives disability check of 400 dollars per month and does not receive food stamps.  The patient "declines government involvement."  The patient was given 4 bus passes and will be discharged by bedside nursing to the bus stop after lunch.  Resources provided in the AVS to include smoking cessation, shelter resources.  Patient reminded to follow up with [facility] Medicine clinic.   Final next level of care: Homeless Shelter Barriers to Discharge: No Barriers Identified   Patient Goals and CMS Choice CMS Medicare.gov Compare Post Acute Care list provided to:: Patient Choice offered to / list presented to : Patient  Discharge Placement                         Discharge Plan and Services Additional resources added to the After Visit Summary for     Discharge Planning Services: CM Consult Post Acute Care Choice: Resumption of Svcs/PTA Provider                               Social Determinants of Health (SDOH) Interventions SDOH Screenings   Food Insecurity: Patient Declined (09/04/2022)  Housing: High Risk (09/04/2022)  Transportation Needs: Patient Declined (09/04/2022)  Utilities: At Risk (09/04/2022)  Tobacco Use: High Risk (09/03/2022)     Readmission Risk Interventions    08/12/2022    4:45 PM 07/23/2022    4:29 PM 05/28/2021    2:11 PM  Readmission Risk Prevention Plan  Transportation Screening Complete Complete Complete  PCP or Specialist Appt within 3-5 Days    Complete  HRI or Home Care Consult   Complete  Social Work Consult for Recovery Care Planning/Counseling   Complete  Palliative Care Screening   Not Applicable  Medication Review [employer]) Complete Complete Referral to Pharmacy  PCP or Specialist appointment within 3-5 days of discharge Complete Complete   HRI or Home Care Consult Complete Complete   SW Recovery Care/Counseling Consult Complete Complete   Palliative Care Screening Not Applicable Not Applicable   Skilled Nursing Facility Not Applicable Not Applicable

## 2022-09-07 LAB — CULTURE, BLOOD (ROUTINE X 2)

## 2022-09-08 LAB — CULTURE, BLOOD (ROUTINE X 2): Culture: NO GROWTH

## 2022-09-24 ENCOUNTER — Emergency Department (HOSPITAL_COMMUNITY)
Admission: EM | Admit: 2022-09-24 | Discharge: 2022-09-24 | Disposition: A | Discharge status: Home / Self Care | Payer: Medicare HMO | Attending: Emergency Medicine | Admitting: Emergency Medicine

## 2022-09-24 ENCOUNTER — Other Ambulatory Visit: Payer: Self-pay

## 2022-09-24 ENCOUNTER — Encounter (HOSPITAL_COMMUNITY): Payer: Self-pay

## 2022-09-24 ENCOUNTER — Emergency Department (HOSPITAL_COMMUNITY): Payer: Medicare HMO

## 2022-09-24 DIAGNOSIS — I110 Hypertensive heart disease with heart failure: Secondary | ICD-10-CM | POA: Insufficient documentation

## 2022-09-24 DIAGNOSIS — Z79899 Other long term (current) drug therapy: Secondary | ICD-10-CM | POA: Insufficient documentation

## 2022-09-24 DIAGNOSIS — J019 Acute sinusitis, unspecified: Secondary | ICD-10-CM | POA: Insufficient documentation

## 2022-09-24 DIAGNOSIS — M1611 Unilateral primary osteoarthritis, right hip: Secondary | ICD-10-CM | POA: Diagnosis not present

## 2022-09-24 DIAGNOSIS — I509 Heart failure, unspecified: Secondary | ICD-10-CM | POA: Insufficient documentation

## 2022-09-24 DIAGNOSIS — J4521 Mild intermittent asthma with (acute) exacerbation: Secondary | ICD-10-CM | POA: Insufficient documentation

## 2022-09-24 DIAGNOSIS — R059 Cough, unspecified: Secondary | ICD-10-CM | POA: Diagnosis present

## 2022-09-24 MED ORDER — IBUPROFEN 600 MG PO TABS: 600.0000 mg | ORAL_TABLET | Freq: Four times a day (QID) | ORAL | 0 refills | Status: DC | PRN

## 2022-09-24 MED ORDER — IBUPROFEN 400 MG PO TABS
600.0000 mg | ORAL_TABLET | Freq: Once | ORAL | Status: AC
  Administered 2022-09-24: 600 mg via ORAL
  Filled 2022-09-24: qty 1

## 2022-09-24 MED ORDER — DOXYCYCLINE HYCLATE 100 MG PO CAPS: 100.0000 mg | ORAL_CAPSULE | Freq: Two times a day (BID) | ORAL | 0 refills | Status: DC

## 2022-09-24 MED ORDER — DOXYCYCLINE HYCLATE 100 MG PO TABS
100.0000 mg | ORAL_TABLET | Freq: Once | ORAL | Status: DC
  Filled 2022-09-24: qty 1

## 2022-09-24 MED ORDER — AEROCHAMBER Z-STAT PLUS/MEDIUM MISC
1.0000 | Freq: Once | Status: AC
  Administered 2022-09-24: 1
  Filled 2022-09-24: qty 1

## 2022-09-24 MED ORDER — OXYMETAZOLINE HCL 0.05 % NA SOLN
1.0000 | Freq: Two times a day (BID) | NASAL | Status: DC | PRN
  Filled 2022-09-24: qty 30

## 2022-09-24 MED ORDER — ALBUTEROL SULFATE HFA 108 (90 BASE) MCG/ACT IN AERS
1.0000 | INHALATION_SPRAY | RESPIRATORY_TRACT | Status: DC | PRN
  Filled 2022-09-24: qty 6.7

## 2022-09-24 NOTE — ED Triage Notes (Signed)
Pt arrived POV with a complaint of yellow mucus coming out of mouth and nose for about 2 weeks.PT also states that he has pain/stiffness in the right hip and in the back for about 2-3 months. Some swelling has been noted by pt.

## 2022-09-24 NOTE — ED Provider Notes (Signed)
[place] [facility]ERGENCY DEPARTMENT AT [facility] Provider Note   CSN: [id number] Arrival date & time: 09/24/22  1422     History  No chief complaint on file.   Charles [name] is a 52 y.o. male.  Pt is a 52 yo male with a pmhx significant for htn, arthritis, polysubstance abuse, and chf.  Pt has had yellow mucous coming out of his mouth and his nose for 2 weeks.  He's had a cough.  He left his inhaler at his niece's house and he's homeless now.  He does not know if he's had a fever.  Pt also has right hip pain which has been going on for several months.       Home Medications Prior to Admission medications   Medication Sig Start Date End Date Taking? Authorizing Provider  doxycycline (VIBRAMYCIN) 100 MG capsule Take 1 capsule (100 mg total) by mouth 2 (two) times daily. 09/24/22  Yes Jacalyn Lefevre, MD  ibuprofen (ADVIL) 600 MG tablet Take 1 tablet (600 mg total) by mouth every 6 (six) hours as needed. 09/24/22  Yes Jacalyn Lefevre, MD  albuterol (VENTOLIN HFA) 108 (90 Base) MCG/ACT inhaler Inhale 2 puffs into the lungs as needed for wheezing or shortness of breath. 07/31/22   Gerhard Munch, MD  azithromycin (ZITHROMAX) 500 MG tablet Take 1 tablet (500 mg total) by mouth daily. 09/06/22   Crissie Sickles, MD  carvedilol (COREG) 3.125 MG tablet Take 1 tablet (3.125 mg total) by mouth 2 (two) times daily with a meal. Patient not taking: Reported on 09/04/2022 08/14/22   Marrianne Mood, MD  cefdinir (OMNICEF) 300 MG capsule Take 1 capsule (300 mg total) by mouth every 12 (twelve) hours. 09/05/22   Crissie Sickles, MD  empagliflozin (JARDIANCE) 10 MG TABS tablet Take 1 tablet (10 mg total) by mouth daily. Patient not taking: Reported on 09/04/2022 08/14/22   Marrianne Mood, MD  furosemide (LASIX) 40 MG tablet Take 1 tablet (40 mg total) by mouth daily. Take an additional tablet for shortness of breath, leg swelling, or weight gain of 3 lbs in a day or 5 lbs in a week. Patient not  taking: Reported on 09/04/2022 08/14/22   Marrianne Mood, MD  lidocaine (LIDODERM) 5 % Place 1 patch onto the skin daily. Remove & Discard patch within 12 hours or as directed by MD 09/05/22   Crissie Sickles, MD  losartan (COZAAR) 25 MG tablet Take 1 tablet (25 mg total) by mouth daily. Patient not taking: Reported on 09/04/2022 08/14/22 11/12/22  Marrianne Mood, MD  spironolactone (ALDACTONE) 25 MG tablet Take 1/2 tablet (12.5 mg total) by mouth daily. Patient not taking: Reported on 09/04/2022 08/14/22 11/12/22  Marrianne Mood, MD      Allergies    Egg-derived products, Banana, and Pork-derived products    Review of Systems   Review of Systems  HENT:  Positive for rhinorrhea.   Respiratory:  Positive for cough.   Musculoskeletal:        Right hip pain  All other systems reviewed and are negative.   Physical Exam Updated Vital Signs BP (!) 164/130   Pulse 89   Temp 98.1 F (36.7 C)   Resp 14   Ht 5\' 6"  (1.676 m)   Wt 67.6 kg   SpO2 97%   BMI 24.05 kg/m  Physical Exam Vitals and nursing note reviewed.  Constitutional:      Appearance: Normal appearance.  HENT:     Head: Normocephalic and atraumatic.  Right Ear: External ear normal.     Left Ear: External ear normal.     Nose: Nose normal.     Mouth/Throat:     Mouth: Mucous membranes are moist.     Pharynx: Oropharynx is clear.  Eyes:     Extraocular Movements: Extraocular movements intact.     Conjunctiva/sclera: Conjunctivae normal.     Pupils: Pupils are equal, round, and reactive to light.  Cardiovascular:     Rate and Rhythm: Normal rate and regular rhythm.     Pulses: Normal pulses.     Heart sounds: Normal heart sounds.  Pulmonary:     Effort: Pulmonary effort is normal.     Breath sounds: Wheezing present.  Abdominal:     General: Abdomen is flat. Bowel sounds are normal.     Palpations: Abdomen is soft.  Musculoskeletal:        General: Normal range of motion.     Cervical back: Normal range of  motion and neck supple.  Skin:    General: Skin is warm.     Capillary Refill: Capillary refill takes less than 2 seconds.  Neurological:     General: No focal deficit present.     Mental Status: He is alert and oriented to person, place, and time.  Psychiatric:        Mood and Affect: Mood normal.        Behavior: Behavior normal.     ED Results / Procedures / Treatments   Labs (all labs ordered are listed, but only abnormal results are displayed) Labs Reviewed - No data to display  EKG None  Radiology DG HIP UNILAT WITH PELVIS 2-3 VIEWS RIGHT  Result Date: 09/24/2022 CLINICAL DATA:  Hip pain EXAM: DG HIP (WITH OR WITHOUT PELVIS) 2-3V RIGHT COMPARISON:  09/03/2022 FINDINGS: There is no evidence of hip fracture or dislocation. There is moderate joint space narrowing with subchondral sclerosis involving the right hip. IMPRESSION: 1. No acute findings. 2. Moderate right hip osteoarthritis. Electronically Signed   By: Signa Kell M.D.   On: 09/24/2022 15:10    Procedures Procedures    Medications Ordered in ED Medications  doxycycline (VIBRA-TABS) tablet 100 mg (has no administration in time range)  albuterol (VENTOLIN HFA) 108 (90 Base) MCG/ACT inhaler 1-2 puff (has no administration in time range)  aerochamber Z-Stat Plus/medium 1 each (has no administration in time range)  oxymetazoline (AFRIN) 0.05 % nasal spray 1 spray (has no administration in time range)  ibuprofen (ADVIL) tablet 600 mg (has no administration in time range)    ED Course/ Medical Decision Making/ A&P                             Medical Decision Making Risk OTC drugs. Prescription drug management.   This patient presents to the ED for concern of sinus drainage and right hip pain, this involves an extensive number of treatment options, and is a complaint that carries with it a high risk of complications and morbidity.  The differential diagnosis includes uri, sinusitis, arthritis   Co  morbidities that complicate the patient evaluation  htn, arthritis, polysubstance abuse, and chf.   Additional history obtained:  Additional history obtained from epic chart review  Imaging Studies ordered:  I ordered imaging studies including r hip  I independently visualized and interpreted imaging which showed  No acute findings.  2. Moderate right hip osteoarthritis.   I agree with the  radiologist interpretation  Medicines ordered and prescription drug management:  I ordered medication including ibuprofen  for pain; doxy/afrin for sinus issues  Reevaluation of the patient after these medicines showed that the patient improved I have reviewed the patients home medicines and have made adjustments as needed   Problem List / ED Course:  R hip pain:  pt has OA.  I will d/c him with ibuprofen and have him f/u with ortho Sinusitis:  sx have been going on for 2 weeks, so I will treat with doxy and afrin.  He also has a cough, so I will give him an inhaler + spacer.   Reevaluation:  After the interventions noted above, I reevaluated the patient and found that they have :improved   Social Determinants of Health:  homeless   Dispostion:  After consideration of the diagnostic results and the patients response to treatment, I feel that the patent would benefit from discharge with outpatient f/u.          Final Clinical Impression(s) / ED Diagnoses Final diagnoses:  Acute non-recurrent sinusitis, unspecified location  Mild intermittent reactive airway disease with acute exacerbation  Osteoarthritis of right hip, unspecified osteoarthritis type    Rx / DC Orders ED Discharge Orders          Ordered    doxycycline (VIBRAMYCIN) 100 MG capsule  2 times daily        09/24/22 1534    ibuprofen (ADVIL) 600 MG tablet  Every 6 hours PRN        09/24/22 1534              Jacalyn Lefevre, MD 09/24/22 1535

## 2022-10-02 ENCOUNTER — Inpatient Hospital Stay (HOSPITAL_COMMUNITY)
Admission: EM | Admit: 2022-10-02 | Discharge: 2022-10-04 | DRG: 291 | Disposition: A | Discharge status: Home / Self Care | Payer: Medicare HMO | Attending: Internal Medicine | Admitting: Internal Medicine

## 2022-10-02 ENCOUNTER — Other Ambulatory Visit: Payer: Self-pay

## 2022-10-02 ENCOUNTER — Emergency Department (HOSPITAL_COMMUNITY): Payer: Medicare HMO

## 2022-10-02 DIAGNOSIS — J9601 Acute respiratory failure with hypoxia: Secondary | ICD-10-CM | POA: Diagnosis present

## 2022-10-02 DIAGNOSIS — M25561 Pain in right knee: Secondary | ICD-10-CM | POA: Diagnosis present

## 2022-10-02 DIAGNOSIS — M25572 Pain in left ankle and joints of left foot: Secondary | ICD-10-CM | POA: Diagnosis present

## 2022-10-02 DIAGNOSIS — R059 Cough, unspecified: Secondary | ICD-10-CM | POA: Diagnosis not present

## 2022-10-02 DIAGNOSIS — T501X6A Underdosing of loop [high-ceiling] diuretics, initial encounter: Secondary | ICD-10-CM | POA: Diagnosis present

## 2022-10-02 DIAGNOSIS — N182 Chronic kidney disease, stage 2 (mild): Secondary | ICD-10-CM | POA: Diagnosis present

## 2022-10-02 DIAGNOSIS — Z5901 Sheltered homelessness: Secondary | ICD-10-CM

## 2022-10-02 DIAGNOSIS — I5023 Acute on chronic systolic (congestive) heart failure: Secondary | ICD-10-CM | POA: Diagnosis not present

## 2022-10-02 DIAGNOSIS — I130 Hypertensive heart and chronic kidney disease with heart failure and stage 1 through stage 4 chronic kidney disease, or unspecified chronic kidney disease: Principal | ICD-10-CM | POA: Diagnosis present

## 2022-10-02 DIAGNOSIS — B182 Chronic viral hepatitis C: Secondary | ICD-10-CM | POA: Diagnosis not present

## 2022-10-02 DIAGNOSIS — J449 Chronic obstructive pulmonary disease, unspecified: Secondary | ICD-10-CM

## 2022-10-02 DIAGNOSIS — Z8701 Personal history of pneumonia (recurrent): Secondary | ICD-10-CM | POA: Diagnosis not present

## 2022-10-02 DIAGNOSIS — K4090 Unilateral inguinal hernia, without obstruction or gangrene, not specified as recurrent: Secondary | ICD-10-CM | POA: Diagnosis present

## 2022-10-02 DIAGNOSIS — J441 Chronic obstructive pulmonary disease with (acute) exacerbation: Secondary | ICD-10-CM | POA: Diagnosis present

## 2022-10-02 DIAGNOSIS — G8929 Other chronic pain: Secondary | ICD-10-CM | POA: Diagnosis not present

## 2022-10-02 DIAGNOSIS — F17210 Nicotine dependence, cigarettes, uncomplicated: Secondary | ICD-10-CM | POA: Diagnosis not present

## 2022-10-02 DIAGNOSIS — Z5900 Homelessness unspecified: Secondary | ICD-10-CM | POA: Diagnosis not present

## 2022-10-02 DIAGNOSIS — Z91138 Patient's unintentional underdosing of medication regimen for other reason: Secondary | ICD-10-CM | POA: Diagnosis not present

## 2022-10-02 DIAGNOSIS — J811 Chronic pulmonary edema: Secondary | ICD-10-CM | POA: Diagnosis not present

## 2022-10-02 DIAGNOSIS — I110 Hypertensive heart disease with heart failure: Secondary | ICD-10-CM | POA: Diagnosis not present

## 2022-10-02 DIAGNOSIS — F1410 Cocaine abuse, uncomplicated: Secondary | ICD-10-CM | POA: Diagnosis present

## 2022-10-02 DIAGNOSIS — I509 Heart failure, unspecified: Secondary | ICD-10-CM

## 2022-10-02 DIAGNOSIS — R0602 Shortness of breath: Secondary | ICD-10-CM | POA: Diagnosis present

## 2022-10-02 DIAGNOSIS — Z1152 Encounter for screening for COVID-19: Secondary | ICD-10-CM | POA: Diagnosis not present

## 2022-10-02 LAB — CBC WITH DIFFERENTIAL/PLATELET
Abs Immature Granulocytes: 0.01 10*3/uL (ref 0.00–0.07)
Basophils Absolute: 0 10*3/uL (ref 0.0–0.1)
Basophils Relative: 0 %
Eosinophils Absolute: 0 10*3/uL (ref 0.0–0.5)
Eosinophils Relative: 1 %
HCT: 35 % — ABNORMAL LOW (ref 39.0–52.0)
Hemoglobin: 11.4 g/dL — ABNORMAL LOW (ref 13.0–17.0)
Immature Granulocytes: 0 %
Lymphocytes Relative: 29 %
Lymphs Abs: 0.9 10*3/uL (ref 0.7–4.0)
MCH: 34.9 pg — ABNORMAL HIGH (ref 26.0–34.0)
MCHC: 32.6 g/dL (ref 30.0–36.0)
MCV: 107 fL — ABNORMAL HIGH (ref 80.0–100.0)
Monocytes Absolute: 0.5 10*3/uL (ref 0.1–1.0)
Monocytes Relative: 16 %
Neutro Abs: 1.7 10*3/uL (ref 1.7–7.7)
Neutrophils Relative %: 54 %
Platelets: 92 10*3/uL — ABNORMAL LOW (ref 150–400)
RBC: 3.27 MIL/uL — ABNORMAL LOW (ref 4.22–5.81)
RDW: 17.8 % — ABNORMAL HIGH (ref 11.5–15.5)
WBC: 3.2 10*3/uL — ABNORMAL LOW (ref 4.0–10.5)
nRBC: 0 % (ref 0.0–0.2)

## 2022-10-02 LAB — COMPREHENSIVE METABOLIC PANEL WITH GFR
ALT: 33 U/L (ref 0–44)
AST: 41 U/L (ref 15–41)
Albumin: 2.9 g/dL — ABNORMAL LOW (ref 3.5–5.0)
Alkaline Phosphatase: 66 U/L (ref 38–126)
Anion gap: 9 (ref 5–15)
BUN: 11 mg/dL (ref 6–20)
CO2: 22 mmol/L (ref 22–32)
Calcium: 8.6 mg/dL — ABNORMAL LOW (ref 8.9–10.3)
Chloride: 109 mmol/L (ref 98–111)
Creatinine, Ser: 1.35 mg/dL — ABNORMAL HIGH (ref 0.61–1.24)
GFR, Estimated: >60 mL/min
Glucose, Bld: 120 mg/dL — ABNORMAL HIGH (ref 70–99)
Potassium: 3.9 mmol/L (ref 3.5–5.1)
Sodium: 140 mmol/L (ref 135–145)
Total Bilirubin: 1.1 mg/dL (ref 0.3–1.2)
Total Protein: 6.3 g/dL — ABNORMAL LOW (ref 6.5–8.1)

## 2022-10-02 LAB — TROPONIN I (HIGH SENSITIVITY)
Troponin I (High Sensitivity): 68 ng/L — ABNORMAL HIGH (ref <18)
Troponin I (High Sensitivity): 64 ng/L — ABNORMAL HIGH

## 2022-10-02 LAB — SARS CORONAVIRUS 2 BY RT PCR: SARS Coronavirus 2 by RT PCR: NEGATIVE

## 2022-10-02 LAB — BRAIN NATRIURETIC PEPTIDE: B Natriuretic Peptide: 1594.9 pg/mL — ABNORMAL HIGH (ref 0.0–100.0)

## 2022-10-02 MED ORDER — ALBUTEROL SULFATE (2.5 MG/3ML) 0.083% IN NEBU
2.5000 mg | INHALATION_SOLUTION | Freq: Once | RESPIRATORY_TRACT | Status: AC
  Administered 2022-10-02: 2.5 mg via RESPIRATORY_TRACT
  Filled 2022-10-02: qty 3

## 2022-10-02 MED ORDER — SPIRONOLACTONE 12.5 MG HALF TABLET
12.5000 mg | ORAL_TABLET | Freq: Every day | ORAL | Status: DC
  Administered 2022-10-02 – 2022-10-04 (×3): 12.5 mg via ORAL
  Filled 2022-10-02 (×3): qty 1

## 2022-10-02 MED ORDER — PREDNISONE 20 MG PO TABS
60.0000 mg | ORAL_TABLET | Freq: Once | ORAL | Status: AC
  Administered 2022-10-02: 60 mg via ORAL
  Filled 2022-10-02: qty 3

## 2022-10-02 MED ORDER — LOSARTAN POTASSIUM 25 MG PO TABS
25.0000 mg | ORAL_TABLET | Freq: Every day | ORAL | Status: DC
  Administered 2022-10-02 – 2022-10-03 (×2): 25 mg via ORAL
  Filled 2022-10-02 (×2): qty 1

## 2022-10-02 MED ORDER — ACETAMINOPHEN 325 MG PO TABS
650.0000 mg | ORAL_TABLET | Freq: Four times a day (QID) | ORAL | Status: DC | PRN
  Administered 2022-10-03: 650 mg via ORAL
  Filled 2022-10-02: qty 2

## 2022-10-02 MED ORDER — PREDNISONE 20 MG PO TABS
40.0000 mg | ORAL_TABLET | Freq: Every day | ORAL | Status: DC
  Administered 2022-10-03 – 2022-10-04 (×2): 40 mg via ORAL
  Filled 2022-10-02 (×2): qty 2

## 2022-10-02 MED ORDER — AZITHROMYCIN 250 MG PO TABS
500.0000 mg | ORAL_TABLET | Freq: Every day | ORAL | Status: AC
  Administered 2022-10-03 – 2022-10-04 (×2): 500 mg via ORAL
  Filled 2022-10-02 (×2): qty 2

## 2022-10-02 MED ORDER — UMECLIDINIUM-VILANTEROL 62.5-25 MCG/ACT IN AEPB
1.0000 | INHALATION_SPRAY | Freq: Every day | RESPIRATORY_TRACT | Status: DC
  Administered 2022-10-04: 1 via RESPIRATORY_TRACT
  Filled 2022-10-02: qty 14

## 2022-10-02 MED ORDER — BUDESONIDE 0.25 MG/2ML IN SUSP
0.2500 mg | Freq: Two times a day (BID) | RESPIRATORY_TRACT | Status: DC
  Administered 2022-10-02 – 2022-10-03 (×2): 0.25 mg via RESPIRATORY_TRACT
  Filled 2022-10-02 (×4): qty 2

## 2022-10-02 MED ORDER — RIVAROXABAN 10 MG PO TABS
10.0000 mg | ORAL_TABLET | Freq: Every day | ORAL | Status: DC
  Administered 2022-10-02 – 2022-10-04 (×3): 10 mg via ORAL
  Filled 2022-10-02 (×3): qty 1

## 2022-10-02 MED ORDER — ACETAMINOPHEN 650 MG RE SUPP: 650.0000 mg | Freq: Four times a day (QID) | RECTAL | Status: DC | PRN

## 2022-10-02 MED ORDER — FUROSEMIDE 10 MG/ML IJ SOLN
40.0000 mg | Freq: Once | INTRAMUSCULAR | Status: AC
  Administered 2022-10-02: 40 mg via INTRAVENOUS
  Filled 2022-10-02: qty 4

## 2022-10-02 MED ORDER — IPRATROPIUM-ALBUTEROL 0.5-2.5 (3) MG/3ML IN SOLN
3.0000 mL | RESPIRATORY_TRACT | Status: AC
  Administered 2022-10-02: 3 mL via RESPIRATORY_TRACT
  Filled 2022-10-02 (×3): qty 3

## 2022-10-02 MED ORDER — EMPAGLIFLOZIN 10 MG PO TABS
10.0000 mg | ORAL_TABLET | Freq: Every day | ORAL | Status: DC
  Administered 2022-10-02 – 2022-10-04 (×3): 10 mg via ORAL
  Filled 2022-10-02 (×3): qty 1

## 2022-10-02 MED ORDER — IPRATROPIUM-ALBUTEROL 0.5-2.5 (3) MG/3ML IN SOLN
3.0000 mL | Freq: Once | RESPIRATORY_TRACT | Status: AC
  Administered 2022-10-02: 3 mL via RESPIRATORY_TRACT
  Filled 2022-10-02: qty 3

## 2022-10-02 MED ORDER — IPRATROPIUM-ALBUTEROL 0.5-2.5 (3) MG/3ML IN SOLN
3.0000 mL | Freq: Four times a day (QID) | RESPIRATORY_TRACT | Status: DC | PRN
  Filled 2022-10-02: qty 3

## 2022-10-02 MED ORDER — SODIUM CHLORIDE 0.9 % IV SOLN
500.0000 mg | INTRAVENOUS | Status: DC
  Administered 2022-10-02: 500 mg via INTRAVENOUS
  Filled 2022-10-02: qty 5

## 2022-10-02 NOTE — ED Notes (Signed)
Cardiac monitor applied to patient, patient transported to the floor by patient transport
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
## 2022-10-02 NOTE — ED Triage Notes (Signed)
Pt c/o sob and non-productive cough x 2-3 weeks; pt unsure if having fevers; sob worse with exertion; endorsing pain in R knee, R hip, lower back, and swelling in feet; endorses hx CHF; used crack cocaine yesterday

## 2022-10-02 NOTE — ED Notes (Signed)
Pt keeps removing blood pressure cuff, pulse ox, and cardiac leads.

## 2022-10-02 NOTE — Hospital Course (Addendum)
WBC 3.2 Platelets 92 Hgb 11.4 Creatinine 1.35 Troponin 68 BNP 1594  BP 154/105 RR 24  Ed course: Prednisone Furosemide  Cough, dyspnea, yellow productive cough for few weeks. No hemoptysis, vomited yesterday. No chest pain. Hip, knee, back pain,   He was staying with niece and had medications. Not taking any meds for a month.  He was working but quit job as he was feeling worse. Uses cane. Daily crack cocaine use, 4-5 cigarettes a day, 30+ years. No alcohol use. Access to food is so-so.   5/23: Access to healthcare/medicines is a big problem. He is currently unhomed and can't easily get to a pharmacy. He feels about the same as yesterday. Also has a headache today. Has consistent blurry vision but no changes. No n/v.

## 2022-10-02 NOTE — ED Notes (Signed)
Transport paged for patient to go to the floor
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
## 2022-10-02 NOTE — ED Provider Notes (Signed)
[place] [facility] AT [facility] Provider Note   CSN: [id number] Arrival date & time: 10/02/22  1024     History  Chief Complaint  Patient presents with   Shortness of Breath    Walther Graetz is a 52 y.o. male.  HPI 52 year old male presents with cough and dyspnea. Has been having symptoms for over 1 week. He was here 8 days ago with similar symptoms. Cough is nonproductive. No fevers. Has chest pain when he coughs only. Feels short of breath. Some leg swelling. Used cocaine yesterday, states he temporarily felt better from his symptoms. Has been using the inhaler. Never picked up the doxycycline he was prescribed. Symptoms have been worse since yesterday morning.   Home Medications Prior to Admission medications   Medication Sig Start Date End Date Taking? Authorizing Provider  albuterol (VENTOLIN HFA) 108 (90 Base) MCG/ACT inhaler Inhale 2 puffs into the lungs as needed for wheezing or shortness of breath. 07/31/22  Yes Gerhard Munch, MD  Aspirin-Acetaminophen-Caffeine (GOODY HEADACHE PO) Take 2 packets by mouth daily as needed (pain, headache).   Yes [provider]  azithromycin (ZITHROMAX) 500 MG tablet Take 1 tablet (500 mg total) by mouth daily. Patient not taking: Reported on 10/02/2022 09/06/22   Crissie Sickles, MD  carvedilol (COREG) 3.125 MG tablet Take 1 tablet (3.125 mg total) by mouth 2 (two) times daily with a meal. Patient not taking: Reported on 10/02/2022 08/14/22   Marrianne Mood, MD  cefdinir (OMNICEF) 300 MG capsule Take 1 capsule (300 mg total) by mouth every 12 (twelve) hours. Patient not taking: Reported on 10/02/2022 09/05/22   Crissie Sickles, MD  doxycycline (VIBRAMYCIN) 100 MG capsule Take 1 capsule (100 mg total) by mouth 2 (two) times daily. Patient not taking: Reported on 10/02/2022 09/24/22   Jacalyn Lefevre, MD  empagliflozin (JARDIANCE) 10 MG TABS tablet Take 1 tablet (10 mg total) by mouth daily. Patient not taking:  Reported on 10/02/2022 08/14/22   Marrianne Mood, MD  furosemide (LASIX) 40 MG tablet Take 1 tablet (40 mg total) by mouth daily. Take an additional tablet for shortness of breath, leg swelling, or weight gain of 3 lbs in a day or 5 lbs in a week. Patient not taking: Reported on 09/04/2022 08/14/22   Marrianne Mood, MD  ibuprofen (ADVIL) 600 MG tablet Take 1 tablet (600 mg total) by mouth every 6 (six) hours as needed. Patient not taking: Reported on 10/02/2022 09/24/22   Jacalyn Lefevre, MD  lidocaine (LIDODERM) 5 % Place 1 patch onto the skin daily. Remove & Discard patch within 12 hours or as directed by MD Patient not taking: Reported on 10/02/2022 09/05/22   Crissie Sickles, MD  losartan (COZAAR) 25 MG tablet Take 1 tablet (25 mg total) by mouth daily. Patient not taking: Reported on 09/04/2022 08/14/22 11/12/22  Marrianne Mood, MD  spironolactone (ALDACTONE) 25 MG tablet Take 1/2 tablet (12.5 mg total) by mouth daily. Patient not taking: Reported on 09/04/2022 08/14/22 11/12/22  Marrianne Mood, MD      Allergies    Egg-derived products, Banana, and Pork-derived products    Review of Systems   Review of Systems  Constitutional:  Negative for fever.  Respiratory:  Positive for cough and shortness of breath.   Cardiovascular:  Positive for chest pain and leg swelling.  Musculoskeletal:  Positive for arthralgias (chronic right hip pain) and back pain (chronic).    Physical Exam Updated Vital Signs BP (!) 154/105  Pulse 95   Temp 98.4 F (36.9 C) (Oral)   Resp (!) 24   SpO2 100%  Physical Exam Vitals and nursing note reviewed.  Constitutional:      Appearance: He is well-developed.  HENT:     Head: Normocephalic and atraumatic.  Cardiovascular:     Rate and Rhythm: Normal rate and regular rhythm.     Heart sounds: Normal heart sounds.  Pulmonary:     Effort: Pulmonary effort is normal. No tachypnea, accessory muscle usage or respiratory distress.     Breath sounds: Wheezing  (diffuse, expiratory) present.     Comments: Speaking in clear sentences Abdominal:     Palpations: Abdomen is soft.     Tenderness: There is no abdominal tenderness.  Skin:    General: Skin is warm and dry.  Neurological:     Mental Status: He is alert.     ED Results / Procedures / Treatments   Labs (all labs ordered are listed, but only abnormal results are displayed) Labs Reviewed  COMPREHENSIVE METABOLIC PANEL - Abnormal; Notable for the following components:      Result Value   Glucose, Bld 120 (*)    Creatinine, Ser 1.35 (*)    Calcium 8.6 (*)    Total Protein 6.3 (*)    Albumin 2.9 (*)    All other components within normal limits  BRAIN NATRIURETIC PEPTIDE - Abnormal; Notable for the following components:   B Natriuretic Peptide 1,594.9 (*)    All other components within normal limits  CBC WITH DIFFERENTIAL/PLATELET - Abnormal; Notable for the following components:   WBC 3.2 (*)    RBC 3.27 (*)    Hemoglobin 11.4 (*)    HCT 35.0 (*)    MCV 107.0 (*)    MCH 34.9 (*)    RDW 17.8 (*)    Platelets 92 (*)    All other components within normal limits  TROPONIN I (HIGH SENSITIVITY) - Abnormal; Notable for the following components:   Troponin I (High Sensitivity) 68 (*)    All other components within normal limits  SARS CORONAVIRUS 2 BY RT PCR  TROPONIN I (HIGH SENSITIVITY)    EKG EKG Interpretation  Date/Time:  Wednesday Oct 02 2022 10:38:53 EDT Ventricular Rate:  97 PR Interval:  171 QRS Duration: 77 QT Interval:  403 QTC Calculation: 507 R Axis:   115 Text Interpretation: Sinus rhythm Atrial premature complex Consider left ventricular hypertrophy Borderline T abnormalities, inferior leads LVH with repolarization abnormality similar to April 2024 Confirmed by Pricilla Loveless [phone]) on 10/02/2022 10:42:10 AM  Radiology DG Chest 2 View  Result Date: 10/02/2022 CLINICAL DATA:  Cough. EXAM: CHEST - 2 VIEW COMPARISON:  Chest radiographs 09/03/2022 FINDINGS: The  cardiac silhouette remains enlarged. Airspace opacities on the prior study have resolved. There is mild central pulmonary vascular congestion without overt edema. No pleural effusion or pneumothorax is identified. No acute osseous abnormality is seen. IMPRESSION: Cardiomegaly and mild pulmonary vascular congestion. Electronically Signed   By: Sebastian Ache M.D.   On: 10/02/2022 11:31    Procedures Procedures    Medications Ordered in ED Medications  rivaroxaban (XARELTO) tablet 10 mg (has no administration in time range)  acetaminophen (TYLENOL) tablet 650 mg (has no administration in time range)    Or  acetaminophen (TYLENOL) suppository 650 mg (has no administration in time range)  predniSONE (DELTASONE) tablet 60 mg (60 mg Oral Given 10/02/22 1124)  ipratropium-albuterol (DUONEB) 0.5-2.5 (3) MG/3ML nebulizer solution 3 mL (  3 mLs Nebulization Given 10/02/22 1124)  albuterol (PROVENTIL) (2.5 MG/3ML) 0.083% nebulizer solution 2.5 mg (2.5 mg Nebulization Given 10/02/22 1124)  furosemide (LASIX) injection 40 mg (40 mg Intravenous Given 10/02/22 1414)    ED Course/ Medical Decision Making/ A&P                             Medical Decision Making Amount and/or Complexity of Data Reviewed Labs: ordered.    Details: Troponin elevated but likely from CHF.  BNP elevated at nearly 1600. Radiology: ordered and independent interpretation performed.    Details: Mild CHF on chest x-ray ECG/medicine tests: ordered and independent interpretation performed.    Details: LVH  Risk Prescription drug management. Decision regarding hospitalization.   Patient felt a little bit better with the albuterol but I suspect his wheezing is probably more fluid than anything else.  He is not distress but is tachypneic and his sats dropped into the low 90s around 91% requiring some oxygen for both low O2 and symptomatic relief.  Will give IV Lasix.  Talking the patient more he has not been taking his Lasix at home.  I  think is likely because of what appears to be a CHF exacerbation.  Given his work of breathing he will need admission and diuresis and I discussed with the internal medicine teaching service.        Final Clinical Impression(s) / ED Diagnoses Final diagnoses:  Acute on chronic congestive heart failure, unspecified heart failure type [facility])    Rx / DC Orders ED Discharge Orders     None         Pricilla Loveless, MD 10/02/22 1554

## 2022-10-03 ENCOUNTER — Encounter (HOSPITAL_COMMUNITY): Payer: Self-pay | Admitting: Internal Medicine

## 2022-10-03 DIAGNOSIS — T501X6A Underdosing of loop [high-ceiling] diuretics, initial encounter: Secondary | ICD-10-CM | POA: Diagnosis present

## 2022-10-03 DIAGNOSIS — J441 Chronic obstructive pulmonary disease with (acute) exacerbation: Secondary | ICD-10-CM | POA: Diagnosis present

## 2022-10-03 DIAGNOSIS — R0602 Shortness of breath: Secondary | ICD-10-CM | POA: Diagnosis present

## 2022-10-03 DIAGNOSIS — J9601 Acute respiratory failure with hypoxia: Secondary | ICD-10-CM | POA: Diagnosis present

## 2022-10-03 DIAGNOSIS — K4090 Unilateral inguinal hernia, without obstruction or gangrene, not specified as recurrent: Secondary | ICD-10-CM | POA: Diagnosis present

## 2022-10-03 DIAGNOSIS — M25561 Pain in right knee: Secondary | ICD-10-CM | POA: Diagnosis present

## 2022-10-03 DIAGNOSIS — M25572 Pain in left ankle and joints of left foot: Secondary | ICD-10-CM | POA: Diagnosis present

## 2022-10-03 DIAGNOSIS — J449 Chronic obstructive pulmonary disease, unspecified: Secondary | ICD-10-CM

## 2022-10-03 DIAGNOSIS — I130 Hypertensive heart and chronic kidney disease with heart failure and stage 1 through stage 4 chronic kidney disease, or unspecified chronic kidney disease: Secondary | ICD-10-CM | POA: Diagnosis present

## 2022-10-03 DIAGNOSIS — F1410 Cocaine abuse, uncomplicated: Secondary | ICD-10-CM | POA: Diagnosis present

## 2022-10-03 DIAGNOSIS — G8929 Other chronic pain: Secondary | ICD-10-CM | POA: Diagnosis present

## 2022-10-03 DIAGNOSIS — F17210 Nicotine dependence, cigarettes, uncomplicated: Secondary | ICD-10-CM | POA: Diagnosis present

## 2022-10-03 DIAGNOSIS — Z5901 Sheltered homelessness: Secondary | ICD-10-CM | POA: Diagnosis not present

## 2022-10-03 DIAGNOSIS — Z8701 Personal history of pneumonia (recurrent): Secondary | ICD-10-CM | POA: Diagnosis not present

## 2022-10-03 DIAGNOSIS — I110 Hypertensive heart disease with heart failure: Secondary | ICD-10-CM

## 2022-10-03 DIAGNOSIS — Z5900 Homelessness unspecified: Secondary | ICD-10-CM

## 2022-10-03 DIAGNOSIS — N182 Chronic kidney disease, stage 2 (mild): Secondary | ICD-10-CM | POA: Diagnosis present

## 2022-10-03 DIAGNOSIS — I5023 Acute on chronic systolic (congestive) heart failure: Secondary | ICD-10-CM | POA: Diagnosis not present

## 2022-10-03 DIAGNOSIS — B182 Chronic viral hepatitis C: Secondary | ICD-10-CM | POA: Diagnosis present

## 2022-10-03 DIAGNOSIS — Z1152 Encounter for screening for COVID-19: Secondary | ICD-10-CM | POA: Diagnosis not present

## 2022-10-03 DIAGNOSIS — Z91138 Patient's unintentional underdosing of medication regimen for other reason: Secondary | ICD-10-CM | POA: Diagnosis not present

## 2022-10-03 LAB — COMPREHENSIVE METABOLIC PANEL
ALT: 29 U/L (ref 0–44)
AST: 34 U/L (ref 15–41)
Albumin: 3.1 g/dL — ABNORMAL LOW (ref 3.5–5.0)
Alkaline Phosphatase: 73 U/L (ref 38–126)
Anion gap: 11 (ref 5–15)
BUN: 14 mg/dL (ref 6–20)
CO2: 24 mmol/L (ref 22–32)
Calcium: 9.1 mg/dL (ref 8.9–10.3)
Chloride: 105 mmol/L (ref 98–111)
Creatinine, Ser: 1.35 mg/dL — ABNORMAL HIGH (ref 0.61–1.24)
GFR, Estimated: >60 mL/min (ref >60)
Glucose, Bld: 105 mg/dL — ABNORMAL HIGH (ref 70–99)
Potassium: 4.1 mmol/L (ref 3.5–5.1)
Sodium: 140 mmol/L (ref 135–145)
Total Bilirubin: 0.5 mg/dL (ref 0.3–1.2)
Total Protein: 6.8 g/dL (ref 6.5–8.1)

## 2022-10-03 LAB — CBC
HCT: 36.3 % — ABNORMAL LOW (ref 39.0–52.0)
Hemoglobin: 12.3 g/dL — ABNORMAL LOW (ref 13.0–17.0)
MCH: 34.2 pg — ABNORMAL HIGH (ref 26.0–34.0)
MCHC: 33.9 g/dL (ref 30.0–36.0)
MCV: 100.8 fL — ABNORMAL HIGH (ref 80.0–100.0)
Platelets: 114 10*3/uL — ABNORMAL LOW (ref 150–400)
RBC: 3.6 MIL/uL — ABNORMAL LOW (ref 4.22–5.81)
RDW: 17 % — ABNORMAL HIGH (ref 11.5–15.5)
WBC: 5 10*3/uL (ref 4.0–10.5)
nRBC: 0 % (ref 0.0–0.2)

## 2022-10-03 MED ORDER — LOSARTAN POTASSIUM 25 MG PO TABS
25.0000 mg | ORAL_TABLET | Freq: Once | ORAL | Status: AC
  Administered 2022-10-03: 25 mg via ORAL
  Filled 2022-10-03: qty 1

## 2022-10-03 MED ORDER — ADULT MULTIVITAMIN W/MINERALS CH
1.0000 | ORAL_TABLET | Freq: Every day | ORAL | Status: DC
  Administered 2022-10-03 – 2022-10-04 (×2): 1 via ORAL
  Filled 2022-10-03 (×2): qty 1

## 2022-10-03 MED ORDER — IPRATROPIUM-ALBUTEROL 0.5-2.5 (3) MG/3ML IN SOLN: 3.0000 mL | Freq: Four times a day (QID) | RESPIRATORY_TRACT | Status: DC | PRN

## 2022-10-03 MED ORDER — ENSURE ENLIVE PO LIQD
237.0000 mL | Freq: Two times a day (BID) | ORAL | Status: DC
  Administered 2022-10-04: 237 mL via ORAL

## 2022-10-03 MED ORDER — GUAIFENESIN ER 600 MG PO TB12
600.0000 mg | ORAL_TABLET | Freq: Two times a day (BID) | ORAL | Status: DC
  Administered 2022-10-03 – 2022-10-04 (×3): 600 mg via ORAL
  Filled 2022-10-03 (×3): qty 1

## 2022-10-03 MED ORDER — FUROSEMIDE 10 MG/ML IJ SOLN
40.0000 mg | Freq: Once | INTRAMUSCULAR | Status: AC
  Administered 2022-10-03: 40 mg via INTRAVENOUS
  Filled 2022-10-03: qty 4

## 2022-10-03 MED ORDER — LOSARTAN POTASSIUM 50 MG PO TABS
50.0000 mg | ORAL_TABLET | Freq: Every day | ORAL | Status: DC
  Administered 2022-10-04: 50 mg via ORAL
  Filled 2022-10-03: qty 1

## 2022-10-03 NOTE — Progress Notes (Signed)
Initial Nutrition Assessment  DOCUMENTATION CODES:   Not applicable  INTERVENTION:  Continue diet as ordered Double protein portions with all meals Ensure Enlive po BID, each supplement provides 350 kcal and 20 grams of protein (strawberry and vanilla) MVI with minerals daily  NUTRITION DIAGNOSIS:   Increased nutrient needs related to chronic illness (COPD with acute exacerbation) as evidenced by estimated needs.  GOAL:   Patient will meet greater than or equal to 90% of their needs  MONITOR:   PO intake, Supplement acceptance, Weight trends, I & O's  REASON FOR ASSESSMENT:   Malnutrition Screening Tool    ASSESSMENT:   Pt admitted with dyspnea 2/2 CHF exacerbation. PMH significant for HTN, CKD 2, COPD, CHF, polysubstance use, hepatitis C, homeless. Recently admitted 4/23-4/25 for hypoxic respiratory failure likely multifactorial d/t PNA, COPD and CHF.  Attempted to speak with pt at bedside. Pt lying in the dark, frustrated by people in and out and trying to sleep. Unable to obtain detailed history. Pt reports wishing for more food and is ready for lunch. NT reports pt is eating really well. Provided Ensure for pt while waiting for lunch. He likes these supplements and wishes to continue to receive during admission.   Pt with poor dentition but reports being able to eat without difficulty.   Reports his weight "is the same" at 153 lbs. Reviewed weight history. Pt's weight on 10/26/21 was 79.4 kg. Current weight noted to be 69.5 kg. Pt with noted history of CHF and volume overload. Admitted with LE edema. Uncertain of pt's true dry weight. Will continue to monitor weight throughout admission.   Medications: zithromax, jardiance, prednisone  Labs: Cr 135   NUTRITION - FOCUSED PHYSICAL EXAM: Deferred to follow up d/t pt frustration and trying to sleep.   Diet Order:   Diet Order             Diet Heart Room service appropriate? Yes; Fluid consistency: Thin; Fluid  restriction: 2000 mL Fluid  Diet effective now                   EDUCATION NEEDS:   Not appropriate for education at this time  Skin:  Skin Assessment: Reviewed RN Assessment  Last BM:  5/22  Height:   Ht Readings from Last 1 Encounters:  10/02/22 5\' 6"  (1.676 m)    Weight:   Wt Readings from Last 1 Encounters:  10/03/22 69.5 kg   BMI:  Body mass index is 24.73 kg/m.  Estimated Nutritional Needs:   Kcal:  2000-2200  Protein:  100-115g  Fluid:  >/=2L  Drusilla Kanner, RDN, LDN Clinical Nutrition

## 2022-10-03 NOTE — Plan of Care (Signed)

## 2022-10-03 NOTE — Progress Notes (Signed)
Best of 3 Peak Flow with great pt effort was 300.

## 2022-10-03 NOTE — Progress Notes (Signed)
Heart Failure Navigator Progress Note  Assessed for Heart & Vascular TOC clinic readiness.  Patient does not meet criteria due to [facility] cardiology.   Navigator will sign off at this time.    Ladasia Sircy, BSN, RN Heart Failure Nurse Navigator Secure Chat Only   

## 2022-10-03 NOTE — Discharge Summary (Signed)
Name: Charles [name] MRN: [id number] DOB: [birth year] 52 y.o. PCP: Renaye Rakers, MD  Date of Admission: 10/02/2022 10:25 AM Date of Discharge: 10-04-2022 Attending Physician: Mercie Eon, MD  Discharge Diagnosis: Principal Problem:   CHF exacerbation [facility]) Active Problems:   COPD exacerbation [facility])     Discharge Medications: Allergies as of 10/04/2022       Reactions   Egg-derived Products Nausea And Vomiting   Banana Nausea And Vomiting   Pork-derived Products Nausea And Vomiting        Medication List     STOP taking these medications    albuterol 108 (90 Base) MCG/ACT inhaler Commonly known as: VENTOLIN HFA   azithromycin 500 MG tablet Commonly known as: ZITHROMAX   carvedilol 3.125 MG tablet Commonly known as: COREG   cefdinir 300 MG capsule Commonly known as: OMNICEF   doxycycline 100 MG capsule Commonly known as: VIBRAMYCIN   GOODY HEADACHE PO   ibuprofen 600 MG tablet Commonly known as: ADVIL   lidocaine 5 % Commonly known as: LIDODERM       TAKE these medications    empagliflozin 10 MG Tabs tablet Commonly known as: JARDIANCE Take 1 tablet (10 mg total) by mouth daily. Start taking on: Oct 05, 2022   furosemide 40 MG tablet Commonly known as: LASIX Take 1 tablet (40 mg total) by mouth daily. Take an additional tablet for shortness of breath, leg swelling, or weight gain of 3 lbs in a day or 5 lbs in a week.   guaiFENesin 600 MG 12 hr tablet Commonly known as: MUCINEX Take 1 tablet (600 mg total) by mouth 2 (two) times daily for 10 days.   losartan 100 MG tablet Commonly known as: COZAAR Take 1 tablet (100 mg total) by mouth daily. Start taking on: Oct 05, 2022 What changed:  medication strength how much to take   predniSONE 20 MG tablet Commonly known as: DELTASONE Take 2 tablets (40 mg total) by mouth daily with breakfast. Start taking on: Oct 05, 2022   spironolactone 25 MG tablet Commonly known as: ALDACTONE Take 0.5  tablets (12.5 mg total) by mouth daily. Start taking on: Oct 05, 2022   tiotropium 18 MCG inhalation capsule Commonly known as: Spiriva HandiHaler Place 1 capsule (18 mcg total) into inhaler and inhale daily.         Disposition and follow-up:    Charles [name] was discharged from [facility] in Stable condition.  At the hospital follow up visit please address:   1.  Heart failure reduced ejection fraction: inquire about symptoms such as breath shortness and leg swelling, assess volume status, confirm adherence to medication regimen including furosemide and empagliflozin   2.  Chronic obstructive lung disease: inquire about symptoms such as breath shortness and productive cough, assess pulmonary function, confirm completion of prednisone course last dose 5-26 and adherence to tiotropium   3.  High blood pressure: inquire about symptoms such as lightheadedness and headache, check blood pressure, confirm adherence to medications including losartan and spironolactone   4.  Labs / imaging needed at time of follow-up: pulmonary function tests  5.  Pending labs / tests needing follow-up: none    Follow-up Appointments:  Internal Medicine Clinic on 10-17-2022 at 10:45am with Dr [facility] Course by problem list:  ---Acute heart failure exacerbation Patient has history of heart failure reduced ejection fraction, most recent echocardiogram performed 07-2022 demonstrated EF 30-35 and global LV hypokinesis.  Recently discharged on 4-25 with medication regimen of carvedilol, spironolactone, losartan, empagliflozin, and furosemide but patient reports non-adherence. Presented on 5-22 with breath shortness and leg swelling. Upon arrival, laboratory testing demonstrated elevated Trop 68-64 and BNP 1594. Exam revealed signs of volume overload. Presentation consistent with acute heart failure exacerbation, concurrent COPD exacerbation also likely. One dose of intravenous  furosemide was administered in the ED and has precipitated 3.9L net fluid loss. Resumed home medications and completed diuresis based on volume status. Home dose of losartan increased to 100mg  on for better blood pressure control. Discharged with furosemide, empagliflozin, losartan, and spironolactone. Carvedilol discontinued given likely coexisting COPD.     ---Acute hypoxic respiratory failure, now resolved ---Probable chronic obstructive pulmonary disease exacerbation Patient has history of COPD without documented pulmonary function tests, managed at home with albuterol as needed. Recently hospitalized on 4-23 for pneumonia and multifactorial hypoxic respiratory failure. At that time, he was discharged with short course of ceftriaxone and azithromycin. Presented on 5-22 with breath shortness and several weeks of productive cough. Upon arrival, patient required supplemental oxygen but has remained afebrile and without leukocytosis. Chest radiograph negative for any consolidations, overall presentation consistent with COPD exacerbation. Regimen of umeclidinium-vilanterol, budesonide, prednisone, and azithromycin was started. Breathing improved after treatments. Discharged with umeclidinium-vilanterol and course of prednisone plus azithromycin.     ---Hypertension Patient has history of hypertension. Prescribed losartan, carvedilol, spironolactone, and furosemide but reports non-adherence over last several weeks. Home medications, except carvedilol, were started on arrival. Blood pressure remained elevated. Losartan dose increased for better blood pressure control. Discharged with furosemide, losartan, and spironolactone. Carvedilol was discontinued given likely concurrent COPD.     ---Polysubstance including cocaine use ---Homelessness Patient is currently homeless and reports using multiple substances including cocaine daily. Smokes about 1ppd of cigarettes and rarely drinks alcohol. Denies using  opioids and injected drugs. After prior discharge on 4-3, the patient reports using cocaine frequently including the day before admission. Extensive counseling on importance of cessation has been provided in the past and present.     ---Chronic hip and knee pain Patient describes longstanding pain in his left ankle and right knee that he attributes to traumatic childhood injury. Manages and self-medicates the pain at home with frequent cocaine use. Suboxone therapy was attempted during previous hospitalization, but patient started to experience unwanted side effects. Upon arrival, patient reports persistence of his chronic pain without any acute changes or worsening.      ---Chronic hepatitis C infection Patient has history of chronic hepatitis C infection, most recent viral load collected 07-2022 was 6.97x106. Vaccinations for hepatitis A and B were administered during previous hospitalization. At that time, he was instructed to establish with infectious disease as outpatient but never followed up. Strong recommendation to follow up with infectious disease as outpatient    Discharge Exam:    BP (!) 179/124 (BP Location: Right Arm)   Pulse (!) 43   Temp 97.7 F (36.5 C) (Oral)   Resp 18   Ht 5\' 6"  (1.676 m)   Wt 69.9 kg   SpO2 96%   BMI 24.87 kg/m   Subjective: Patient reports feeling okay this morning, experiencing some mild back pain. Overall, feeling a little better than when he arrived. Ready for discharge and plans to stay with his niece. Discussed importance of taking medications and following up in the Internal Medicine Clinic. Patient expressed understanding and agreement.   General:  awake and alert, lying comfortably in bed, restless with voluntary twitching of extremities, not in acute distress Lungs:                          normal respiratory effort, breathing unlabored, symmetrical chest rise, no crackles or wheezing Cardiac:                         regular rate and rhythm, normal S1 and S2, no JVD or pitting edema Abdomen:                     soft and non-distended, normoactive bowel sounds, no tenderness to palpation or guarding Neurologic:                   oriented to person-place-time, moving all extremities, no gross focal deficits Psychiatric:                   euthymic mood with congruent affect, quick but intelligible speech    Pertinent Labs, Studies, and Procedures:   Labs:     Latest Ref Rng & Units 10/03/2022    1:17 AM 10/02/2022   10:40 AM 09/05/2022    6:23 AM  CBC  WBC 4.0 - 10.5 K/uL 5.0  3.2  4.2   Hemoglobin 13.0 - 17.0 g/dL 69.6  29.5  28.4   Hematocrit 39.0 - 52.0 % 36.3  35.0  43.0   Platelets 150 - 400 K/uL 114  92  157       Latest Ref Rng & Units 10/04/2022   12:36 AM 10/03/2022    1:17 AM 10/02/2022   10:40 AM  CMP  Glucose 70 - 99 mg/dL 132  440  102   BUN 6 - 20 mg/dL 26  14  11    Creatinine 0.61 - 1.24 mg/dL 7.25  3.66  4.40   Sodium 135 - 145 mmol/L 134  140  140   Potassium 3.5 - 5.1 mmol/L 4.2  4.1  3.9   Chloride 98 - 111 mmol/L 100  105  109   CO2 22 - 32 mmol/L 25  24  22    Calcium 8.9 - 10.3 mg/dL 9.0  9.1  8.6   Total Protein 6.5 - 8.1 g/dL  6.8  6.3   Total Bilirubin 0.3 - 1.2 mg/dL  0.5  1.1   Alkaline Phos 38 - 126 U/L  73  66   AST 15 - 41 U/L  34  41   ALT 0 - 44 U/L  29  33     ______________________  Imaging:  DG Chest 2 View Result Date: 10/02/2022 IMPRESSION: Cardiomegaly and mild pulmonary vascular congestion.   ______________________  Procedures:  none  ______________________   Discharge Instructions:  Discharge Instructions     Diet - low sodium heart healthy   Complete by: As directed    Discharge instructions   Complete by: As directed    Mr Nohl,  It was a pleasure taking care of you while you were in the hospital. Your breath shortness was caused by fluid collecting in your lungs from heart failure. This also caused an exacerbation of your  underlying lung disease. We gave you several medications for your heart and lungs. One of these medications was furosemide, which helps remove extra fliud from your body. After receiving these medications, your breathing improved.  It is very important to take your  medications after you leave the hospital. Taking your medications regularly, even when you are feeling okay, will help prevent you from returning to the hospital so frequently. We are providing you with a thirty day supply right now, but please follow up with Dr Sloan Leiter in the Internal Medicine Clinic on 10-17-2022 for refills.  Return to the emergency department if your breathing suddenly worsens again or if you notice significant leg swelling.   Increase activity slowly   Complete by: As directed      Mr Sport,  It was a pleasure taking care of you while you were in the hospital. Your breath shortness was caused by fluid collecting in your lungs from heart failure. This also caused an exacerbation of your underlying lung disease. We gave you several medications for your heart and lungs. One of these medications was furosemide, which helps remove extra fliud from your body. After receiving these medications, your breathing improved.  It is very important to take your medications after you leave the hospital. Taking your medications regularly, even when you are feeling okay, will help prevent you from returning to the hospital so frequently. We are providing you with a thirty day supply right now, but please follow up with your primary care provider for refills.  Return to the emergency department if your breathing suddenly worsens again or if you notice significant leg swelling.   All the best,  Signed: Lajuana Ripple, MD Internal Medicine PGY-1 Pager [phone]

## 2022-10-04 ENCOUNTER — Other Ambulatory Visit (HOSPITAL_COMMUNITY): Payer: Self-pay

## 2022-10-04 DIAGNOSIS — F17210 Nicotine dependence, cigarettes, uncomplicated: Secondary | ICD-10-CM | POA: Diagnosis not present

## 2022-10-04 DIAGNOSIS — I5023 Acute on chronic systolic (congestive) heart failure: Secondary | ICD-10-CM | POA: Diagnosis not present

## 2022-10-04 DIAGNOSIS — I110 Hypertensive heart disease with heart failure: Secondary | ICD-10-CM | POA: Diagnosis not present

## 2022-10-04 DIAGNOSIS — J441 Chronic obstructive pulmonary disease with (acute) exacerbation: Secondary | ICD-10-CM | POA: Diagnosis not present

## 2022-10-04 LAB — BASIC METABOLIC PANEL
Anion gap: 9 (ref 5–15)
BUN: 26 mg/dL — ABNORMAL HIGH (ref 6–20)
CO2: 25 mmol/L (ref 22–32)
Calcium: 9 mg/dL (ref 8.9–10.3)
Chloride: 100 mmol/L (ref 98–111)
Creatinine, Ser: 1.25 mg/dL — ABNORMAL HIGH (ref 0.61–1.24)
GFR, Estimated: >60 mL/min (ref >60)
Glucose, Bld: 106 mg/dL — ABNORMAL HIGH (ref 70–99)
Potassium: 4.2 mmol/L (ref 3.5–5.1)
Sodium: 134 mmol/L — ABNORMAL LOW (ref 135–145)

## 2022-10-04 MED ORDER — SPIRONOLACTONE 25 MG PO TABS
12.5000 mg | ORAL_TABLET | Freq: Every day | ORAL | 0 refills | Status: DC
  Filled 2022-10-04: qty 30, 60d supply, fill #0

## 2022-10-04 MED ORDER — LOSARTAN POTASSIUM 100 MG PO TABS
100.0000 mg | ORAL_TABLET | Freq: Every day | ORAL | 0 refills | Status: DC
  Filled 2022-10-04: qty 30, 30d supply, fill #0

## 2022-10-04 MED ORDER — GUAIFENESIN ER 600 MG PO TB12
600.0000 mg | ORAL_TABLET | Freq: Two times a day (BID) | ORAL | 0 refills | Status: AC
  Filled 2022-10-04: qty 20, 10d supply, fill #0

## 2022-10-04 MED ORDER — PREDNISONE 20 MG PO TABS
40.0000 mg | ORAL_TABLET | Freq: Every day | ORAL | 0 refills | Status: DC
  Filled 2022-10-04: qty 4, 2d supply, fill #0

## 2022-10-04 MED ORDER — TIOTROPIUM BROMIDE MONOHYDRATE 18 MCG IN CAPS
18.0000 ug | ORAL_CAPSULE | Freq: Every day | RESPIRATORY_TRACT | 0 refills | Status: DC
  Filled 2022-10-04: qty 30, 30d supply, fill #0

## 2022-10-04 MED ORDER — LOSARTAN POTASSIUM 50 MG PO TABS: 100.0000 mg | ORAL_TABLET | Freq: Every day | ORAL | Status: DC

## 2022-10-04 MED ORDER — EMPAGLIFLOZIN 10 MG PO TABS
10.0000 mg | ORAL_TABLET | Freq: Every day | ORAL | 0 refills | Status: DC
  Filled 2022-10-04: qty 30, 30d supply, fill #0

## 2022-10-04 MED ORDER — LOSARTAN POTASSIUM 50 MG PO TABS
50.0000 mg | ORAL_TABLET | Freq: Once | ORAL | Status: AC
  Administered 2022-10-04: 50 mg via ORAL
  Filled 2022-10-04: qty 1

## 2022-10-04 NOTE — TOC Initial Note (Addendum)
Transition of Care [facility]) - Initial/Assessment Note    Patient Details  Name: Charles [name] MRN: [id number] Date of Birth: [birth year]  Transition of Care [facility]) CM/SW Contact:    Leone Haven, RN Phone Number: 10/04/2022, 11:35 AM  Clinical Narrative:                 Patient is homeless, he had mentioned to the Staff RN that he was going to his niece home at dc.  When this NCM spoke with him about where he was going he states he is going to a shelter , then said" it is no ones business where he is going".  He states he needs two bus passes.  This NCM gave him two bus passes.  Will add SA to AVS. He states he does not want a housing list because he has enough to carry around.the Shelter list was added to AVS also.  He has follow up with [facility] on 6/6 at 10:45.   Expected Discharge Plan: Homeless Shelter Barriers to Discharge: No Barriers Identified   Patient Goals and CMS Choice Patient states their goals for this hospitalization and ongoing recovery are:: per patient "it is none of anyone's busineess where he is going"   Choice offered to / list presented to : NA      Expected Discharge Plan and Services In-house Referral: NA Discharge Planning Services: CM Consult Post Acute Care Choice: NA Living arrangements for the past 2 months: Homeless Shelter Expected Discharge Date: 10/04/22               DME Arranged: N/A DME Agency: NA       HH Arranged: NA          Prior Living Arrangements/Services Living arrangements for the past 2 months: Homeless Shelter Lives with:: Other (Comment) (Homeless) Patient language and need for interpreter reviewed:: Yes Do you feel safe going back to the place where you live?: Yes      Need for Family Participation in Patient Care: No (Comment) Care giver support system in place?: No (comment)   Criminal Activity/Legal Involvement Pertinent to Current Situation/Hospitalization: No - Comment as needed  Activities of Daily  Living Home Assistive Devices/Equipment: Cane (specify quad or straight) ADL Screening (condition at time of admission) Patient's cognitive ability adequate to safely complete daily activities?: Yes Is the patient deaf or have difficulty hearing?: No Does the patient have difficulty seeing, even when wearing glasses/contacts?: No Does the patient have difficulty concentrating, remembering, or making decisions?: No Patient able to express need for assistance with ADLs?: Yes Does the patient have difficulty dressing or bathing?: No Independently performs ADLs?: Yes (appropriate for developmental age) Does the patient have difficulty walking or climbing stairs?: No Weakness of Legs: None Weakness of Arms/Hands: None  Permission Sought/Granted                  Emotional Assessment Appearance:: Appears stated age Attitude/Demeanor/Rapport: Apprehensive Affect (typically observed): Agitated Orientation: : Oriented to Self, Oriented to Place, Oriented to  Time, Oriented to Situation Alcohol / Substance Use: Illicit Drugs Psych Involvement: No (comment)  Admission diagnosis:  CHF exacerbation (HCC) [I50.9] Acute on chronic congestive heart failure, unspecified heart failure type (HCC) [I50.9] Patient Active Problem List   Diagnosis Date Noted   COPD exacerbation (HCC) 10/03/2022   CHF exacerbation (HCC) 10/02/2022   Acute hypoxic respiratory failure (HCC) 09/03/2022   Chronic pain 08/13/2022   Acute on chronic congestive heart failure (HCC) 08/10/2022  Essential hypertension 07/22/2022   Acute on chronic systolic congestive heart failure (HCC) 02/28/2022   CKD (chronic kidney disease), stage II 02/28/2022   Cocaine abuse (HCC) 02/28/2022   Bacteremia 05/27/2021   Acute on chronic systolic and diastolic CHF (congestive heart failure) (HCC) 05/27/2021   Polysubstance abuse (HCC) 05/26/2021   COPD with acute exacerbation (HCC) 05/26/2021   Hepatitis C 05/26/2021   Homelessness  05/26/2021   Seizure (HCC) 06/06/2020   Hypokalemia 06/22/2019   Splenic laceration, initial encounter 08/02/2016   Thrombocytopenia (HCC) 10/19/2014   Tobacco use disorder 10/19/2014   PCP:  Renaye Rakers, MD Pharmacy:   [facility] [phone] - [place], [place] - [address] AT [facility] OF [address] [place] [address] Phone: [phone] Fax: [phone]  [id number] [place], [place] - [address] AT [facility] OF [address] [place] [address] Phone: [phone] Fax: [phone]  [facility] [address] [place] [address] Phone: [phone] Fax: [phone]     Social Determinants of Health (SDOH) Social History: SDOH Screenings   Food Insecurity: Patient Declined (10/02/2022)  Housing: Medium Risk (10/03/2022)  Transportation Needs: Patient Declined (10/02/2022)  Utilities: At Risk (10/02/2022)  Tobacco Use: High Risk (10/03/2022)   SDOH Interventions:     Readmission Risk Interventions    10/04/2022   11:27 AM 08/12/2022    4:45 PM 07/23/2022    4:29 PM  Readmission Risk Prevention Plan  Transportation Screening Complete Complete Complete  Medication Review Oceanographer) Complete Complete Complete  PCP or Specialist appointment within 3-5 days of discharge Complete Complete Complete  HRI or Home Care Consult Complete Complete Complete  SW Recovery Care/Counseling Consult Complete Complete Complete  Palliative Care Screening Not Applicable Not Applicable Not Applicable  Skilled Nursing Facility Not Applicable Not Applicable Not Applicable

## 2022-10-04 NOTE — TOC Transition Note (Signed)
Transition of Care [facility]) - CM/SW Discharge Note   Patient Details  Name: Charles [name] MRN: [id number] Date of Birth: Aug 02, 1970  Transition of Care [facility]) CM/SW Contact:  Leone Haven, RN Phone Number: 10/04/2022, 11:39 AM   Clinical Narrative:    For dc today, has two bus passes , shelter list and SA is on AVS.    Final next level of care: Homeless Shelter Barriers to Discharge: No Barriers Identified   Patient Goals and CMS Choice   Choice offered to / list presented to : NA  Discharge Placement                         Discharge Plan and Services Additional resources added to the After Visit Summary for   In-house Referral: NA Discharge Planning Services: CM Consult Post Acute Care Choice: NA          DME Arranged: N/A DME Agency: NA       HH Arranged: NA          Social Determinants of Health (SDOH) Interventions SDOH Screenings   Food Insecurity: Patient Declined (10/02/2022)  Housing: Medium Risk (10/03/2022)  Transportation Needs: Patient Declined (10/02/2022)  Utilities: At Risk (10/02/2022)  Tobacco Use: High Risk (10/03/2022)     Readmission Risk Interventions    10/04/2022   11:27 AM 08/12/2022    4:45 PM 07/23/2022    4:29 PM  Readmission Risk Prevention Plan  Transportation Screening Complete Complete Complete  Medication Review Oceanographer) Complete Complete Complete  PCP or Specialist appointment within 3-5 days of discharge Complete Complete Complete  HRI or Home Care Consult Complete Complete Complete  SW Recovery Care/Counseling Consult Complete Complete Complete  Palliative Care Screening Not Applicable Not Applicable Not Applicable  Skilled Nursing Facility Not Applicable Not Applicable Not Applicable

## 2022-10-04 NOTE — Progress Notes (Signed)
Attempted to give pt discharge instructions and take out IV. Pt began to yell and scream regarding his lunch. Gave pt his AVS and medications not pt would not receive education.

## 2022-10-04 NOTE — Progress Notes (Signed)
Mobility Specialist Progress Note:   10/04/22 1005  Mobility  Activity Ambulated independently in hallway  Level of Assistance Independent  Assistive Device None  Distance Ambulated (ft) 250 ft  Activity Response Tolerated well  Mobility Referral Yes  $Mobility charge 1 Mobility  Mobility Specialist Start Time (ACUTE ONLY) 1005  Mobility Specialist Stop Time (ACUTE ONLY) 1013  Mobility Specialist Time Calculation (min) (ACUTE ONLY) 8 min   Pt ambulated with no physical assistance. Asx throughout ambulation, eager for d/c.  Addison Lank Mobility Specialist Please contact via SecureChat or  Rehab office at [phone]

## 2022-10-04 NOTE — TOC Benefit Eligibility Note (Signed)
Patient Product/process development scientist completed.    The patient is currently admitted and upon discharge could be taking Spiriva Handihaler 18 mcg.  The current 30 day co-pay is $0.00.   The patient is currently admitted and upon discharge could be taking Anoro Ellipta .  Not on Formulary  The patient is insured through [facility] Part D   This test claim was processed through [facility]- copay amounts may vary at other pharmacies due to pharmacy/plan contracts, or as the patient moves through the different stages of their insurance plan.  Roland Earl, CPHT Pharmacy Patient Advocate Specialist [facility] Patient Advocate Team Direct Number: [phone]  Fax: [phone]

## 2022-10-14 ENCOUNTER — Encounter: Payer: Self-pay | Admitting: *Deleted

## 2022-10-14 NOTE — Progress Notes (Addendum)
Pt attended 03/30/22 event where b/p was 196/110. During initial f/u, health equity team was able to contact family members who stated she could not confirm pt took meds, and that other family members also aware of "other things going on in his life." Per chart review, since second f/u attempt on 06/12/22, pt has continued to have ED visits and hospitalizations where care management has been involved in his plan of care and discharge planning, most recently from 10/04/22 hospital stay. Dr. Lowella Bandy is still listed as his PCP but [facility] documentation does not connect with Dr. Tedra Senegal clinic and hospital f/u visit coordinated with [facility] with appt made on 10/17/22 for post hospital discharge f/u visit. Letter sent to last known address (hospital CM notes state pt may live there or may remain unhoused) with upcoming visit date and time info. No additional health equity team support scheduled at this time.Per chart review, pt also has [facility] care management resources available. 10/29/22 letter returned

## 2022-10-17 ENCOUNTER — Encounter: Payer: Medicare HMO | Admitting: Internal Medicine

## 2022-10-18 ENCOUNTER — Emergency Department (HOSPITAL_COMMUNITY): Payer: Medicare HMO

## 2022-10-18 ENCOUNTER — Emergency Department (HOSPITAL_COMMUNITY)
Admission: EM | Admit: 2022-10-18 | Discharge: 2022-10-19 | Disposition: A | Discharge status: Home / Self Care | Payer: Medicare HMO | Source: Home / Self Care | Attending: Emergency Medicine | Admitting: Emergency Medicine

## 2022-10-18 ENCOUNTER — Other Ambulatory Visit (HOSPITAL_COMMUNITY): Payer: Self-pay

## 2022-10-18 ENCOUNTER — Emergency Department (HOSPITAL_COMMUNITY)
Admission: EM | Admit: 2022-10-18 | Discharge: 2022-10-18 | Disposition: A | Discharge status: Home / Self Care | Payer: Medicare HMO | Attending: Emergency Medicine | Admitting: Emergency Medicine

## 2022-10-18 ENCOUNTER — Encounter (HOSPITAL_COMMUNITY): Payer: Self-pay | Admitting: Emergency Medicine

## 2022-10-18 ENCOUNTER — Other Ambulatory Visit: Payer: Self-pay

## 2022-10-18 DIAGNOSIS — R531 Weakness: Secondary | ICD-10-CM | POA: Insufficient documentation

## 2022-10-18 DIAGNOSIS — F1910 Other psychoactive substance abuse, uncomplicated: Secondary | ICD-10-CM

## 2022-10-18 DIAGNOSIS — R0602 Shortness of breath: Secondary | ICD-10-CM | POA: Diagnosis not present

## 2022-10-18 DIAGNOSIS — I10 Essential (primary) hypertension: Secondary | ICD-10-CM | POA: Diagnosis not present

## 2022-10-18 DIAGNOSIS — Z7951 Long term (current) use of inhaled steroids: Secondary | ICD-10-CM | POA: Insufficient documentation

## 2022-10-18 DIAGNOSIS — R7989 Other specified abnormal findings of blood chemistry: Secondary | ICD-10-CM | POA: Insufficient documentation

## 2022-10-18 DIAGNOSIS — Z5900 Homelessness unspecified: Secondary | ICD-10-CM | POA: Insufficient documentation

## 2022-10-18 DIAGNOSIS — F17200 Nicotine dependence, unspecified, uncomplicated: Secondary | ICD-10-CM | POA: Insufficient documentation

## 2022-10-18 DIAGNOSIS — J449 Chronic obstructive pulmonary disease, unspecified: Secondary | ICD-10-CM | POA: Insufficient documentation

## 2022-10-18 DIAGNOSIS — M25551 Pain in right hip: Secondary | ICD-10-CM | POA: Insufficient documentation

## 2022-10-18 DIAGNOSIS — R600 Localized edema: Secondary | ICD-10-CM | POA: Diagnosis not present

## 2022-10-18 DIAGNOSIS — I509 Heart failure, unspecified: Secondary | ICD-10-CM | POA: Insufficient documentation

## 2022-10-18 DIAGNOSIS — G8929 Other chronic pain: Secondary | ICD-10-CM | POA: Diagnosis not present

## 2022-10-18 DIAGNOSIS — M1611 Unilateral primary osteoarthritis, right hip: Secondary | ICD-10-CM | POA: Diagnosis not present

## 2022-10-18 LAB — CBC
HCT: 42.1 % (ref 39.0–52.0)
Hemoglobin: 13.9 g/dL (ref 13.0–17.0)
MCH: 34.1 pg — ABNORMAL HIGH (ref 26.0–34.0)
MCHC: 33 g/dL (ref 30.0–36.0)
MCV: 103.2 fL — ABNORMAL HIGH (ref 80.0–100.0)
Platelets: 111 10*3/uL — ABNORMAL LOW (ref 150–400)
RBC: 4.08 MIL/uL — ABNORMAL LOW (ref 4.22–5.81)
RDW: 17.2 % — ABNORMAL HIGH (ref 11.5–15.5)
WBC: 2.8 10*3/uL — ABNORMAL LOW (ref 4.0–10.5)
nRBC: 0 % (ref 0.0–0.2)

## 2022-10-18 LAB — BASIC METABOLIC PANEL
Anion gap: 6 (ref 5–15)
Anion gap: 11 (ref 5–15)
BUN: 15 mg/dL (ref 6–20)
BUN: 16 mg/dL (ref 6–20)
CO2: 23 mmol/L (ref 22–32)
CO2: 24 mmol/L (ref 22–32)
Calcium: 8.5 mg/dL — ABNORMAL LOW (ref 8.9–10.3)
Calcium: 8.8 mg/dL — ABNORMAL LOW (ref 8.9–10.3)
Chloride: 105 mmol/L (ref 98–111)
Chloride: 101 mmol/L (ref 98–111)
Creatinine, Ser: 1.42 mg/dL — ABNORMAL HIGH (ref 0.61–1.24)
Creatinine, Ser: 1.46 mg/dL — ABNORMAL HIGH (ref 0.61–1.24)
GFR, Estimated: 60 mL/min — ABNORMAL LOW (ref >60)
GFR, Estimated: 58 mL/min — ABNORMAL LOW (ref >60)
Glucose, Bld: 313 mg/dL — ABNORMAL HIGH (ref 70–99)
Glucose, Bld: 110 mg/dL — ABNORMAL HIGH (ref 70–99)
Potassium: 4 mmol/L (ref 3.5–5.1)
Potassium: 4.1 mmol/L (ref 3.5–5.1)
Sodium: 134 mmol/L — ABNORMAL LOW (ref 135–145)
Sodium: 136 mmol/L (ref 135–145)

## 2022-10-18 LAB — CBC WITH DIFFERENTIAL/PLATELET
Abs Immature Granulocytes: 0.01 10*3/uL (ref 0.00–0.07)
Basophils Absolute: 0 10*3/uL (ref 0.0–0.1)
Basophils Relative: 0 %
Eosinophils Absolute: 0 10*3/uL (ref 0.0–0.5)
Eosinophils Relative: 1 %
HCT: 42.2 % (ref 39.0–52.0)
Hemoglobin: 14 g/dL (ref 13.0–17.0)
Immature Granulocytes: 0 %
Lymphocytes Relative: 46 %
Lymphs Abs: 1.4 10*3/uL (ref 0.7–4.0)
MCH: 34.2 pg — ABNORMAL HIGH (ref 26.0–34.0)
MCHC: 33.2 g/dL (ref 30.0–36.0)
MCV: 103.2 fL — ABNORMAL HIGH (ref 80.0–100.0)
Monocytes Absolute: 0.4 10*3/uL (ref 0.1–1.0)
Monocytes Relative: 14 %
Neutro Abs: 1.2 10*3/uL — ABNORMAL LOW (ref 1.7–7.7)
Neutrophils Relative %: 39 %
Platelets: 123 10*3/uL — ABNORMAL LOW (ref 150–400)
RBC: 4.09 MIL/uL — ABNORMAL LOW (ref 4.22–5.81)
RDW: 17.2 % — ABNORMAL HIGH (ref 11.5–15.5)
WBC: 3 10*3/uL — ABNORMAL LOW (ref 4.0–10.5)
nRBC: 0 % (ref 0.0–0.2)

## 2022-10-18 LAB — TROPONIN I (HIGH SENSITIVITY)
Troponin I (High Sensitivity): 68 ng/L — ABNORMAL HIGH (ref <18)
Troponin I (High Sensitivity): 72 ng/L — ABNORMAL HIGH (ref <18)

## 2022-10-18 LAB — BRAIN NATRIURETIC PEPTIDE
B Natriuretic Peptide: 1730.8 pg/mL — ABNORMAL HIGH (ref 0.0–100.0)
B Natriuretic Peptide: 1410.1 pg/mL — ABNORMAL HIGH (ref 0.0–100.0)

## 2022-10-18 MED ORDER — EMPAGLIFLOZIN 10 MG PO TABS
10.0000 mg | ORAL_TABLET | Freq: Every day | ORAL | Status: DC
  Administered 2022-10-18: 10 mg via ORAL
  Filled 2022-10-18: qty 1

## 2022-10-18 MED ORDER — SPIRONOLACTONE 25 MG PO TABS
12.5000 mg | ORAL_TABLET | Freq: Every day | ORAL | 1 refills | Status: DC
  Filled 2022-10-18: qty 90, 180d supply, fill #0

## 2022-10-18 MED ORDER — FUROSEMIDE 40 MG PO TABS
40.0000 mg | ORAL_TABLET | Freq: Every day | ORAL | 1 refills | Status: DC
  Filled 2022-10-18: qty 90, 90d supply, fill #0

## 2022-10-18 MED ORDER — FUROSEMIDE 20 MG PO TABS
40.0000 mg | ORAL_TABLET | Freq: Every day | ORAL | Status: DC
  Administered 2022-10-18: 40 mg via ORAL
  Filled 2022-10-18: qty 2

## 2022-10-18 MED ORDER — LOSARTAN POTASSIUM 100 MG PO TABS
100.0000 mg | ORAL_TABLET | Freq: Every day | ORAL | 1 refills | Status: DC
  Filled 2022-10-18: qty 90, 90d supply, fill #0

## 2022-10-18 MED ORDER — TIOTROPIUM BROMIDE MONOHYDRATE 18 MCG IN CAPS
18.0000 ug | ORAL_CAPSULE | Freq: Every day | RESPIRATORY_TRACT | 1 refills | Status: DC
  Filled 2022-10-18: qty 90, 90d supply, fill #0

## 2022-10-18 MED ORDER — EMPAGLIFLOZIN 10 MG PO TABS
10.0000 mg | ORAL_TABLET | Freq: Every day | ORAL | 1 refills | Status: DC
  Filled 2022-10-18: qty 90, 90d supply, fill #0

## 2022-10-18 MED ORDER — SPIRONOLACTONE 12.5 MG HALF TABLET
12.5000 mg | ORAL_TABLET | Freq: Every day | ORAL | Status: DC
  Administered 2022-10-18: 12.5 mg via ORAL
  Filled 2022-10-18: qty 1

## 2022-10-18 MED ORDER — LOSARTAN POTASSIUM 50 MG PO TABS
100.0000 mg | ORAL_TABLET | Freq: Once | ORAL | Status: AC
  Administered 2022-10-18: 100 mg via ORAL
  Filled 2022-10-18: qty 2

## 2022-10-18 NOTE — ED Notes (Signed)
Patient was given 2 [place] sandwich bag with drink and graham crackers.

## 2022-10-18 NOTE — Discharge Instructions (Addendum)
Please take your home medications as prescribed. Follow up with your primary care provider for continued medical management.  Return to ED if: your shortness of breath worsens or becomes persistent, you have pain with breathing, fever, or you develop chest pain

## 2022-10-18 NOTE — ED Provider Notes (Signed)
[place] EMERGENCY DEPARTMENT AT [facility] Provider Note   CSN: [id number] Arrival date & time: 10/18/22  0920     History  Chief Complaint  Patient presents with   Shortness of Breath   Fatigue    Charles [name] is a 52 y.o. male with a history of heart failure, COPD, thrombocytopenia, and tobacco abuse who presents to the ED today for shortness of breath and weakness. Patient reports that he woke up this morning feeling short of breath and weak overall.  The shortness of breath is worse with movement and improves at rest.  He reports that he is taking all of his medications as prescribed. He is able to ambulate on his own as well.  No associated chest pain or fever. He denies any recent illness or sick contact. No other complaints or concerns at this time.    Home Medications Prior to Admission medications   Medication Sig Start Date End Date Taking? Authorizing Provider  empagliflozin (JARDIANCE) 10 MG TABS tablet Take 1 tablet (10 mg total) by mouth daily. 10/18/22 01/16/23  Maxwell Marion, PA-C  furosemide (LASIX) 40 MG tablet Take 1 tablet (40 mg total) by mouth daily. Take an additional tablet for shortness of breath, leg swelling, or weight gain of 3 lbs in a day or 5 lbs in a week. 10/18/22 01/16/23  Maxwell Marion, PA-C  losartan (COZAAR) 100 MG tablet Take 1 tablet (100 mg total) by mouth daily. 10/18/22 01/16/23  Maxwell Marion, PA-C  predniSONE (DELTASONE) 20 MG tablet Take 2 tablets (40 mg total) by mouth daily with breakfast. 10/05/22   Masters, Florentina Addison, DO  spironolactone (ALDACTONE) 25 MG tablet Take 0.5 tablets (12.5 mg total) by mouth daily. 10/18/22 01/16/23  Maxwell Marion, PA-C  tiotropium (SPIRIVA HANDIHALER) 18 MCG inhalation capsule Place 1 capsule (18 mcg total) into inhaler and inhale daily. 10/18/22 01/16/23  Maxwell Marion, PA-C      Allergies    Egg-derived products, Banana, and Pork-derived products    Review of Systems   Review of Systems  Respiratory:  Positive  for shortness of breath.   All other systems reviewed and are negative.   Physical Exam Updated Vital Signs BP (!) 173/115 (BP Location: Left Arm)   Pulse 84   Temp 98.2 F (36.8 C) (Oral)   Resp 20   Ht 5\' 6"  (1.676 m)   Wt 70 kg   SpO2 100%   BMI 24.91 kg/m  Physical Exam Vitals and nursing note reviewed.  Constitutional:      General: He is not in acute distress.    Appearance: Normal appearance. He is not toxic-appearing.     Comments: Resting comfortably in bed asking for food  HENT:     Head: Normocephalic and atraumatic.     Mouth/Throat:     Mouth: Mucous membranes are moist.  Eyes:     Conjunctiva/sclera: Conjunctivae normal.     Pupils: Pupils are equal, round, and reactive to light.  Cardiovascular:     Rate and Rhythm: Normal rate and regular rhythm.     Pulses: Normal pulses.     Heart sounds: Normal heart sounds.  Pulmonary:     Effort: Pulmonary effort is normal.     Breath sounds: Normal breath sounds.  Abdominal:     Palpations: Abdomen is soft.     Tenderness: There is no abdominal tenderness.  Musculoskeletal:        General: Normal range of motion.  Right lower leg: Edema present.  Skin:    General: Skin is warm and dry.     Findings: No rash.  Neurological:     General: No focal deficit present.     Mental Status: He is alert.     Motor: No weakness.  Psychiatric:        Mood and Affect: Mood normal.        Behavior: Behavior normal.     ED Results / Procedures / Treatments   Labs (all labs ordered are listed, but only abnormal results are displayed) Labs Reviewed  BASIC METABOLIC PANEL - Abnormal; Notable for the following components:      Result Value   Sodium 134 (*)    Glucose, Bld 313 (*)    Creatinine, Ser 1.42 (*)    Calcium 8.5 (*)    GFR, Estimated 60 (*)    All other components within normal limits  CBC - Abnormal; Notable for the following components:   WBC 2.8 (*)    RBC 4.08 (*)    MCV 103.2 (*)    MCH 34.1  (*)    RDW 17.2 (*)    Platelets 111 (*)    All other components within normal limits  BRAIN NATRIURETIC PEPTIDE - Abnormal; Notable for the following components:   B Natriuretic Peptide 1,730.8 (*)    All other components within normal limits  TROPONIN I (HIGH SENSITIVITY) - Abnormal; Notable for the following components:   Troponin I (High Sensitivity) 68 (*)    All other components within normal limits    EKG EKG Interpretation  Date/Time:  Friday October 18 2022 09:28:19 EDT Ventricular Rate:  92 PR Interval:  146 QRS Duration: 82 QT Interval:  424 QTC Calculation: 524 R Axis:   53 Text Interpretation: Normal sinus rhythm with sinus arrhythmia Right atrial enlargement Left ventricular hypertrophy with repolarization abnormality ( Sokolow-Lyon , Cornell product , Romhilt-Estes ) Prolonged QT Abnormal ECG When compared with ECG of 02-Oct-2022 10:38, PREVIOUS ECG IS PRESENT since last tracing no significant change Confirmed by Eber Hong ([id number]) on 10/18/2022 11:19:32 AM  Radiology DG Chest 2 View  Result Date: 10/18/2022 CLINICAL DATA:  Shortness of breath.  Increasing weakness EXAM: CHEST - 2 VIEW COMPARISON:  X-ray 10/02/2022 FINDINGS: Stable borderline size heart. No consolidation, pneumothorax or effusion. No edema. Embolization material seen in the left upper quadrant of the abdomen. Diffuse degenerative changes of the spine. IMPRESSION: Enlarged heart.  No acute cardiopulmonary disease otherwise. Electronically Signed   By: Karen Kays M.D.   On: 10/18/2022 10:19    Procedures Procedures: not indicated.    Medications Ordered in ED Medications  furosemide (LASIX) tablet 40 mg (40 mg Oral Given 10/18/22 1409)  spironolactone (ALDACTONE) tablet 12.5 mg (12.5 mg Oral Given 10/18/22 1409)  empagliflozin (JARDIANCE) tablet 10 mg (10 mg Oral Given 10/18/22 1409)  losartan (COZAAR) tablet 100 mg (100 mg Oral Given 10/18/22 1409)    ED Course/ Medical Decision Making/ A&P                              Medical Decision Making Amount and/or Complexity of Data Reviewed Labs: ordered. Radiology: ordered.   This patient presents to the ED for concern of shortness of breath, this involves an extensive number of treatment options, and is a complaint that carries with it a high risk of complications and morbidity.   Differential diagnosis includes: COPD exacerbation,  CHF exacerbation, pulmonary embolism, URI, electrolyte imbalance.   Co morbidities that complicate the patient evaluation  COPD CHF - EF 30-35% on echo 07/21/22 Thrombocytopenia Stage 2 CKD Hypertension Homelessness Hepatitis C Polysubstance abuse   Cardiac Monitoring / EKG:  The patient was maintained on a cardiac monitor.  I personally viewed and interpreted the cardiac monitored which showed: LVH with right atrial enlargement with a heart rate of 92 bpm.   Lab Tests:  I ordered and personally interpreted labs - Elevated troponin of 68 and BNP of 1,730.8, which seems to be his baseline compared to previous results. CMP and CBC within normal limits. No signs of acute electrolyte abnormalities.   Imaging Studies ordered:  I ordered imaging studies including CXR. I independently visualized and interpreted imaging which showed enlarged heart but no active pulmonary disease. I agree with the radiologist interpretation   Problem List / ED Course / Critical interventions / Medication management  Shortness of breath and weakness Med reconciliation could not be done, patient reports that he left his backpack with his medications where he was staying and did not know the names of them. Patient was given the home medications listed in his chart - Jardiance, Furosemide, Losartan, and Spironolactone. Patient received food in the ED as well with symptom improvement. I have reviewed the patients home medicines and have made adjustments as needed   Social Determinants of  Health:  Homelessness Polysubstance abuse Access to healthcare   Test / Admission - Considered:  Patient is stable and safe for discharge home. Refills of his medications have been provided at time of discharge.         Final Clinical Impression(s) / ED Diagnoses Final diagnoses:  Shortness of breath    Rx / DC Orders ED Discharge Orders          Ordered    empagliflozin (JARDIANCE) 10 MG TABS tablet  Daily        10/18/22 1439    furosemide (LASIX) 40 MG tablet  Daily        10/18/22 1439    losartan (COZAAR) 100 MG tablet  Daily        10/18/22 1439    spironolactone (ALDACTONE) 25 MG tablet  Daily        10/18/22 1439    tiotropium (SPIRIVA HANDIHALER) 18 MCG inhalation capsule  Daily        10/18/22 1439              Maxwell Marion, PA-C 10/18/22 1455    Eber Hong, MD 10/19/22 [phone]

## 2022-10-18 NOTE — ED Triage Notes (Signed)
Pt just discharged at 1430 today and checked back in less than two hours later for continued symptoms, stating his hip keeps popping out of joint and continues to be sob.

## 2022-10-18 NOTE — ED Triage Notes (Signed)
Pt. Stated, Im SOB , and fatigue tired all over. This started this morning when I woke up

## 2022-10-19 DIAGNOSIS — R0602 Shortness of breath: Secondary | ICD-10-CM | POA: Diagnosis not present

## 2022-10-19 LAB — TROPONIN I (HIGH SENSITIVITY): Troponin I (High Sensitivity): 75 ng/L — ABNORMAL HIGH (ref <18)

## 2022-10-19 NOTE — Discharge Instructions (Signed)
You need to follow-up with the cardiology clinic.  I discussed your case with the cardiologist tonight and they'd like to see you in the clinic.  Your other tests are at baseline.

## 2022-10-19 NOTE — ED Provider Notes (Signed)
[facility] Provider Note MRN:  [id number]  Arrival date & time: 10/19/22     Chief Complaint   Hip Pain   History of Present Illness   Charles [name] is a 52 y.o. year-old male presents to the ED with chief complaint of chronic right hip pain.  Was seen earlier today for shortness of breath as well.  Patient is homeless.  Has history of polysubstance abuse and crack cocaine abuse.  States that he also feels short of breath.  Denies fevers or chills.  Hx of polysubstance abuse.   History provided by patient.   Review of Systems  Pertinent positive and negative review of systems noted in HPI.    Physical Exam   Vitals:   10/18/22 2307 10/19/22 0214  BP: (!) 156/111 (!) 134/104  Pulse: 75 84  Resp: 16 18  Temp: 98.1 F (36.7 C) 97.6 F (36.4 C)  SpO2: 100% 100%    CONSTITUTIONAL:  non toxic-appearing, NAD NEURO:  Alert and oriented x 3, CN 3-12 grossly intact EYES:  eyes equal and reactive ENT/NECK:  Supple, no stridor  CARDIO:  normal rate, regular rhythm, appears well-perfused  PULM:  No respiratory distress, CTAB GI/GU:  non-distended,  MSK/SPINE:  No gross deformities, no edema, moves all extremities  SKIN:  no rash, atraumatic   *Additional and/or pertinent findings included in MDM below  Diagnostic and Interventional Summary    EKG Interpretation  Date/Time:  Friday October 18 2022 22:50:27 EDT Ventricular Rate:  67 PR Interval:  144 QRS Duration: 84 QT Interval:  472 QTC Calculation: 498 R Axis:   78 Text Interpretation: Unusual P axis, possible ectopic atrial rhythm Left ventricular hypertrophy with repolarization abnormality ( Sokolow-Lyon , Cornell product , Romhilt-Estes ) Confirmed by Nicanor Alcon, April ([id number]) on 10/19/2022 2:09:31 AM       Labs Reviewed  CBC WITH DIFFERENTIAL/PLATELET - Abnormal; Notable for the following components:      Result Value   WBC 3.0 (*)    RBC 4.09 (*)    MCV 103.2 (*)     MCH 34.2 (*)    RDW 17.2 (*)    Platelets 123 (*)    Neutro Abs 1.2 (*)    All other components within normal limits  BASIC METABOLIC PANEL - Abnormal; Notable for the following components:   Glucose, Bld 110 (*)    Creatinine, Ser 1.46 (*)    Calcium 8.8 (*)    GFR, Estimated 58 (*)    All other components within normal limits  BRAIN NATRIURETIC PEPTIDE - Abnormal; Notable for the following components:   B Natriuretic Peptide 1,410.1 (*)    All other components within normal limits  TROPONIN I (HIGH SENSITIVITY) - Abnormal; Notable for the following components:   Troponin I (High Sensitivity) 72 (*)    All other components within normal limits  TROPONIN I (HIGH SENSITIVITY) - Abnormal; Notable for the following components:   Troponin I (High Sensitivity) 75 (*)    All other components within normal limits    DG Chest 2 View  Final Result    DG Hip Unilat W or Wo Pelvis 2-3 Views Right  Final Result      Medications - No data to display   Procedures  /  Critical Care Procedures  ED Course and Medical Decision Making  I have reviewed the triage vital signs, the nursing notes, and pertinent available records from the EMR.  Social Determinants Affecting Complexity  of Care: Patient has no clinically significant social determinants affecting this chief complaint..   ED Course:    Medical Decision Making Patient here with right hip pain.  He was seen earlier today for shortness of breath.  Is seen frequently.  His workup today is about baseline.  He is persistently/chronically elevated troponins which were thought secondary to cocaine abuse.  His BNP is also elevated, but he is not hypoxic.  His lung sounds are clear.  He is not in any apparent distress.  I am more suspicious of malingering in this situation, and suspect that he is just looking for a place to stay tonight as he is homeless.  His labs are all about baseline.       Consultants: I consulted with Dr. Welton Flakes,  from cardiology, who recommends outpatient followup.  Agrees that chronically elevated trops are likely from cocaine.   Treatment and Plan: Emergency department workup does not suggest an emergent condition requiring admission or immediate intervention beyond  what has been performed at this time. The patient is safe for discharge and has  been instructed to return immediately for worsening symptoms, change in  symptoms or any other concerns  Patient discussed with attending physician, Dr. Nicanor Alcon, who agrees with plan.  Final Clinical Impressions(s) / ED Diagnoses     ICD-10-CM   1. Pain of right hip  M25.551     2. Elevated troponin  R79.89 Ambulatory referral to Cardiology    3. Elevated brain natriuretic peptide (BNP) level  R79.89     4. Substance abuse (HCC)  F19.10       ED Discharge Orders          Ordered    Ambulatory referral to Cardiology       Comments: If you have not heard from the Cardiology office within the next 72 hours please call [phone].   10/19/22 0511              Discharge Instructions Discussed with and Provided to Patient:     Discharge Instructions      You need to follow-up with the cardiology clinic.  I discussed your case with the cardiologist tonight and they'd like to see you in the clinic.  Your other tests are at baseline.       Roxy Horseman, PA-C 10/19/22 0981    Nicanor Alcon, April, MD 10/19/22 [phone]

## 2022-10-24 ENCOUNTER — Telehealth: Payer: Self-pay

## 2022-10-24 NOTE — Telephone Encounter (Signed)
Transition Care Management Unsuccessful Follow-up Telephone Call  Date of discharge and from where:  [name] 6/8  Attempts:  1st Attempt  Reason for unsuccessful TCM follow-up call:  Left voice message   Charles [name] [facility] Guide, [facility] [phone] [address] [place], [place], [place] [address] Phone: [phone] Email: [email or web address]

## 2022-10-25 ENCOUNTER — Telehealth: Payer: Self-pay

## 2022-10-25 NOTE — Telephone Encounter (Signed)
Transition Care Management Unsuccessful Follow-up Telephone Call  Date of discharge and from where:  [facility]  Attempts:  2nd Attempt 2nd Attempt Reason for unsuccessful TCM follow-up call:  Left voice message   Lenard Forth [facility] Guide, [facility] [phone] [address] [place], [place], [place] [address] Phone: [phone] Email: [email or web address]

## 2022-10-29 ENCOUNTER — Other Ambulatory Visit (HOSPITAL_COMMUNITY): Payer: Self-pay

## 2022-10-29 ENCOUNTER — Emergency Department (HOSPITAL_COMMUNITY): Payer: Medicare HMO

## 2022-10-29 ENCOUNTER — Emergency Department (HOSPITAL_COMMUNITY)
Admission: EM | Admit: 2022-10-29 | Discharge: 2022-10-29 | Disposition: A | Discharge status: Home / Self Care | Payer: Medicare HMO | Source: Home / Self Care | Attending: Emergency Medicine | Admitting: Emergency Medicine

## 2022-10-29 ENCOUNTER — Emergency Department (HOSPITAL_COMMUNITY)
Admission: EM | Admit: 2022-10-29 | Discharge: 2022-10-29 | Disposition: A | Discharge status: Home / Self Care | Payer: Medicare HMO | Attending: Emergency Medicine | Admitting: Emergency Medicine

## 2022-10-29 DIAGNOSIS — K4090 Unilateral inguinal hernia, without obstruction or gangrene, not specified as recurrent: Secondary | ICD-10-CM | POA: Insufficient documentation

## 2022-10-29 DIAGNOSIS — E875 Hyperkalemia: Secondary | ICD-10-CM | POA: Diagnosis not present

## 2022-10-29 DIAGNOSIS — N182 Chronic kidney disease, stage 2 (mild): Secondary | ICD-10-CM | POA: Diagnosis not present

## 2022-10-29 DIAGNOSIS — I509 Heart failure, unspecified: Secondary | ICD-10-CM | POA: Insufficient documentation

## 2022-10-29 DIAGNOSIS — R001 Bradycardia, unspecified: Secondary | ICD-10-CM | POA: Diagnosis not present

## 2022-10-29 DIAGNOSIS — R0602 Shortness of breath: Secondary | ICD-10-CM | POA: Insufficient documentation

## 2022-10-29 DIAGNOSIS — I10 Essential (primary) hypertension: Secondary | ICD-10-CM | POA: Diagnosis not present

## 2022-10-29 DIAGNOSIS — Z79899 Other long term (current) drug therapy: Secondary | ICD-10-CM | POA: Diagnosis not present

## 2022-10-29 DIAGNOSIS — I130 Hypertensive heart and chronic kidney disease with heart failure and stage 1 through stage 4 chronic kidney disease, or unspecified chronic kidney disease: Secondary | ICD-10-CM | POA: Diagnosis not present

## 2022-10-29 DIAGNOSIS — Z1152 Encounter for screening for COVID-19: Secondary | ICD-10-CM | POA: Diagnosis not present

## 2022-10-29 DIAGNOSIS — J449 Chronic obstructive pulmonary disease, unspecified: Secondary | ICD-10-CM | POA: Insufficient documentation

## 2022-10-29 LAB — COMPREHENSIVE METABOLIC PANEL
ALT: 34 U/L (ref 0–44)
AST: 55 U/L — ABNORMAL HIGH (ref 15–41)
Albumin: 3 g/dL — ABNORMAL LOW (ref 3.5–5.0)
Alkaline Phosphatase: 67 U/L (ref 38–126)
Anion gap: 11 (ref 5–15)
BUN: 24 mg/dL — ABNORMAL HIGH (ref 6–20)
CO2: 20 mmol/L — ABNORMAL LOW (ref 22–32)
Calcium: 8.3 mg/dL — ABNORMAL LOW (ref 8.9–10.3)
Chloride: 104 mmol/L (ref 98–111)
Creatinine, Ser: 1.56 mg/dL — ABNORMAL HIGH (ref 0.61–1.24)
GFR, Estimated: 53 mL/min — ABNORMAL LOW (ref >60)
Glucose, Bld: 201 mg/dL — ABNORMAL HIGH (ref 70–99)
Potassium: 5.2 mmol/L — ABNORMAL HIGH (ref 3.5–5.1)
Sodium: 135 mmol/L (ref 135–145)
Total Bilirubin: 2.1 mg/dL — ABNORMAL HIGH (ref 0.3–1.2)
Total Protein: 6.3 g/dL — ABNORMAL LOW (ref 6.5–8.1)

## 2022-10-29 LAB — CBC WITH DIFFERENTIAL/PLATELET
Abs Immature Granulocytes: 0.01 10*3/uL (ref 0.00–0.07)
Basophils Absolute: 0 10*3/uL (ref 0.0–0.1)
Basophils Relative: 0 %
Eosinophils Absolute: 0 10*3/uL (ref 0.0–0.5)
Eosinophils Relative: 0 %
HCT: 44.4 % (ref 39.0–52.0)
Hemoglobin: 15 g/dL (ref 13.0–17.0)
Immature Granulocytes: 0 %
Lymphocytes Relative: 21 %
Lymphs Abs: 0.6 10*3/uL — ABNORMAL LOW (ref 0.7–4.0)
MCH: 34.6 pg — ABNORMAL HIGH (ref 26.0–34.0)
MCHC: 33.8 g/dL (ref 30.0–36.0)
MCV: 102.3 fL — ABNORMAL HIGH (ref 80.0–100.0)
Monocytes Absolute: 0.4 10*3/uL (ref 0.1–1.0)
Monocytes Relative: 14 %
Neutro Abs: 1.8 10*3/uL (ref 1.7–7.7)
Neutrophils Relative %: 65 %
Platelets: 135 10*3/uL — ABNORMAL LOW (ref 150–400)
RBC: 4.34 MIL/uL (ref 4.22–5.81)
RDW: 17.6 % — ABNORMAL HIGH (ref 11.5–15.5)
WBC: 2.8 10*3/uL — ABNORMAL LOW (ref 4.0–10.5)
nRBC: 0 % (ref 0.0–0.2)

## 2022-10-29 LAB — TROPONIN I (HIGH SENSITIVITY)
Troponin I (High Sensitivity): 57 ng/L — ABNORMAL HIGH (ref <18)
Troponin I (High Sensitivity): 38 ng/L — ABNORMAL HIGH (ref <18)

## 2022-10-29 LAB — SARS CORONAVIRUS 2 BY RT PCR: SARS Coronavirus 2 by RT PCR: NEGATIVE

## 2022-10-29 LAB — BRAIN NATRIURETIC PEPTIDE: B Natriuretic Peptide: 1034.3 pg/mL — ABNORMAL HIGH (ref 0.0–100.0)

## 2022-10-29 MED ORDER — FUROSEMIDE 20 MG PO TABS
40.0000 mg | ORAL_TABLET | Freq: Once | ORAL | Status: AC
  Administered 2022-10-29: 40 mg via ORAL
  Filled 2022-10-29: qty 2

## 2022-10-29 MED ORDER — PREDNISONE 20 MG PO TABS
40.0000 mg | ORAL_TABLET | Freq: Every day | ORAL | 0 refills | Status: DC
  Filled 2022-10-29: qty 8, 4d supply, fill #0

## 2022-10-29 MED ORDER — PREDNISONE 20 MG PO TABS
40.0000 mg | ORAL_TABLET | Freq: Every day | ORAL | 0 refills | Status: AC
  Filled 2022-10-29: qty 8, 4d supply, fill #0

## 2022-10-29 MED ORDER — PREDNISONE 20 MG PO TABS
40.0000 mg | ORAL_TABLET | Freq: Once | ORAL | Status: AC
  Administered 2022-10-29: 40 mg via ORAL
  Filled 2022-10-29: qty 2

## 2022-10-29 MED ORDER — PREDNISONE 20 MG PO TABS: 40.0000 mg | ORAL_TABLET | Freq: Every day | ORAL | 0 refills | Status: DC

## 2022-10-29 MED ORDER — IPRATROPIUM-ALBUTEROL 0.5-2.5 (3) MG/3ML IN SOLN
3.0000 mL | Freq: Once | RESPIRATORY_TRACT | Status: AC
  Administered 2022-10-29: 3 mL via RESPIRATORY_TRACT
  Filled 2022-10-29: qty 3

## 2022-10-29 NOTE — ED Provider Notes (Signed)
[place] EMERGENCY DEPARTMENT AT [facility] Provider Note   CSN: [id number] Arrival date & time: 10/29/22  1117     History  Chief Complaint  Patient presents with   Shortness of Breath    Charles [name] is a 52 y.o. male.  With past medical history of COPD, polysubstance abuse, unstable housing, CHF, CKD stage II, hypertension who presents to the emergency department with shortness of breath.  Patient states that symptoms began last night.  States that he woke up feeling short of breath out of sleep.  He also had palpitations at that time.  States his symptoms were worse with laying down.  He denies having any chest pain.  States that symptoms has persisted into this morning and he continues to feel short of breath.  Does note that he has had a cough but denies having any fevers.  During shortness of breath symptoms last night was not having any chest pain or lightheadedness or syncope.  He does endorse chronic lower extremity swelling and states that he is supposed to be taking Lasix but takes that "when he has the medication."   Shortness of Breath Associated symptoms: no wheezing        Home Medications Prior to Admission medications   Medication Sig Start Date End Date Taking? Authorizing Provider  empagliflozin (JARDIANCE) 10 MG TABS tablet Take 1 tablet (10 mg total) by mouth daily. 10/18/22 01/16/23  Maxwell Marion, PA-C  furosemide (LASIX) 40 MG tablet Take 1 tablet (40 mg total) by mouth daily. Take an additional tablet for shortness of breath, leg swelling, or weight gain of 3 lbs in a day or 5 lbs in a week. 10/18/22 01/16/23  Maxwell Marion, PA-C  losartan (COZAAR) 100 MG tablet Take 1 tablet (100 mg total) by mouth daily. 10/18/22 01/16/23  Maxwell Marion, PA-C  predniSONE (DELTASONE) 20 MG tablet Take 2 tablets (40 mg total) by mouth daily with breakfast. 10/05/22   Masters, Florentina Addison, DO  spironolactone (ALDACTONE) 25 MG tablet Take 0.5 tablets (12.5 mg total) by mouth  daily. 10/18/22 01/16/23  Maxwell Marion, PA-C  tiotropium (SPIRIVA HANDIHALER) 18 MCG inhalation capsule Place 1 capsule (18 mcg total) into inhaler and inhale daily. 10/18/22 01/16/23  Maxwell Marion, PA-C      Allergies    Egg-derived products, Banana, and Pork-derived products    Review of Systems   Review of Systems  Respiratory:  Positive for shortness of breath. Negative for wheezing.   Cardiovascular:  Positive for palpitations and leg swelling.  All other systems reviewed and are negative.   Physical Exam Updated Vital Signs BP (!) 154/102 (BP Location: Right Arm)   Pulse (!) 49   Temp 98.6 F (37 C) (Oral)   Resp 18   SpO2 94%  Physical Exam Vitals and nursing note reviewed.  Constitutional:      General: He is not in acute distress.    Appearance: Normal appearance. He is well-developed. He is not ill-appearing or toxic-appearing.  HENT:     Head: Normocephalic.  Eyes:     General: No scleral icterus. Cardiovascular:     Rate and Rhythm: Normal rate and regular rhythm.     Pulses: Normal pulses.     Heart sounds: No murmur heard. Pulmonary:     Effort: Pulmonary effort is normal. No tachypnea or respiratory distress.     Breath sounds: Normal breath sounds. No wheezing, rhonchi or rales.  Abdominal:     General: Bowel sounds  are normal.     Palpations: Abdomen is soft.  Musculoskeletal:     Right lower leg: No edema.     Left lower leg: No edema.  Skin:    General: Skin is warm and dry.     Capillary Refill: Capillary refill takes less than 2 seconds.  Neurological:     General: No focal deficit present.     Mental Status: He is alert and oriented to person, place, and time.  Psychiatric:        Mood and Affect: Mood normal.        Behavior: Behavior normal.     ED Results / Procedures / Treatments   Labs (all labs ordered are listed, but only abnormal results are displayed) Labs Reviewed  CBC WITH DIFFERENTIAL/PLATELET - Abnormal; Notable for the  following components:      Result Value   WBC 2.8 (*)    MCV 102.3 (*)    MCH 34.6 (*)    RDW 17.6 (*)    Platelets 135 (*)    Lymphs Abs 0.6 (*)    All other components within normal limits  SARS CORONAVIRUS 2 BY RT PCR  COMPREHENSIVE METABOLIC PANEL  BRAIN NATRIURETIC PEPTIDE  TROPONIN I (HIGH SENSITIVITY)    EKG EKG Interpretation  Date/Time:  Tuesday October 29 2022 11:38:04 EDT Ventricular Rate:  90 PR Interval:  163 QRS Duration: 77 QT Interval:  400 QTC Calculation: 490 R Axis:   107 Text Interpretation: Sinus rhythm Biatrial enlargement Right axis deviation Consider left ventricular hypertrophy Nonspecific T abnormalities, lateral leads Borderline prolonged QT interval similar to prior (2/24 specifically) Confirmed by Tanda Rockers ([id number]) on 10/29/2022 11:41:36 AM  Radiology No results found.  Procedures Procedures   Medications Ordered in ED Medications - No data to display  ED Course/ Medical Decision Making/ A&P   {   Click here for ABCD2, HEART and other calculatorsREFRESH Note before signing :1}                          Medical Decision Making Amount and/or Complexity of Data Reviewed Labs: ordered. Radiology: ordered.  Initial Impression and Ddx 52 year old male who presents to the emergency department with shortness of breath. Patient PMH that increases complexity of ED encounter: COPD, polysubstance abuse, CHF, CKD stage II, hypertension Differential: ACS, PE, pneumothorax, pleural effusion, cardiac tamponade, aortic dissection, COPD exacerbation, pneumonia, asthma exacerbation, congestive heart failure, viral upper respiratory infection, medication side effect, anaphylaxis, etc.    Interpretation of Diagnostics I independent reviewed and interpreted the labs as followed: ***  - I independently visualized the following imaging with scope of interpretation limited to determining acute life threatening conditions related to emergency care: ***, which  revealed ***  Patient Reassessment and Ultimate Disposition/Management 52 year old male who presents to the emergency department with shortness of breath.  He has a history of CHF and COPD.  Does not appear to be overtly fluid overloaded on my exam.  Has complained of cough which may be related to COPD exacerbation versus a viral syndrome or pneumonia.  Will obtain labs including COVID, troponin, BNP, chest x-ray and reevaluate.    Patient management required discussion with the following services or consulting groups:  {BEROCONSULT:26841}  Complexity of Problems Addressed {BEROCOPA:26833}  Additional Data Reviewed and Analyzed Further history obtained from: {BERODATA:26834}  Patient Encounter Risk Assessment {BERORISK:26838}  Final Clinical Impression(s) / ED Diagnoses Final diagnoses:  None    Rx / DC  Orders ED Discharge Orders     None

## 2022-10-29 NOTE — ED Provider Triage Note (Signed)
Emergency Medicine Provider Triage Evaluation Note  Charles [name] , a 52 y.o. male  was evaluated in triage.  Pt complains of hernia for the past year. He reports that "it's sticking out" and wants it fixed. No trouble with defecation.  Review of Systems  Positive:  Negative:   Physical Exam  BP (!) 154/107 (BP Location: Left Arm)   Pulse 88   Temp 98.3 F (36.8 C) (Oral)   Resp 18   Ht 5\' 6"  (1.676 m)   Wt 69.9 kg   SpO2 96%   BMI 24.86 kg/m  Gen:   Awake, no distress   Resp:  Normal effort  MSK:   Moves extremities without difficulty  Other:  Unable to assess GU in the triage setting. Egg sized soft, reducible inguinal hernia.   Medical Decision Making  Medically screening exam initiated at 6:56 PM.  Appropriate orders placed.  Charles [name] was informed that the remainder of the evaluation will be completed by another provider, this initial triage assessment does not replace that evaluation, and the importance of remaining in the ED until their evaluation is complete.     Achille Rich, [place] 10/29/22 1858

## 2022-10-29 NOTE — ED Triage Notes (Signed)
[facility] Pt c/o SOB that began last night. Pt's lung sounds clear bilaterally. Pt has hx of COPD and asthma. Pt denies CP. Pt's RR unlabored and even on room air.    EMS: 20 RAC 184/82 90 HR 20 RR 96% RA

## 2022-10-29 NOTE — ED Triage Notes (Addendum)
Pt hernia pain in L groin x1 year.  Pain score 9/10.  Pt reports "it's sticking out."  Pt has not taken anything for pain.

## 2022-10-29 NOTE — Discharge Instructions (Signed)
You were seen in the emergency department today for shortness of breath.  Believe this is likely related to your COPD.  I am placing on steroids for the next 4 days.  Please return to the emergency department if you are having worsening symptoms.

## 2022-10-29 NOTE — ED Provider Notes (Signed)
  [facility] Emergency Department Provider Note MRN:  [id number]  Arrival date & time: 10/29/22     Chief Complaint   Hernia   History of Present Illness   Charles [name] is a 52 y.o. year-old male presents to the ED with chief complaint of left inguinal hernia.  He states that he has had it for quite a while, but it is starting to become painful.  He states that it does reduce easily.  He states that he would like to have it fixed.  He asks for some food.    History provided by patient.   Review of Systems  Pertinent positive and negative review of systems noted in HPI.    Physical Exam   Vitals:   10/29/22 1840 10/29/22 2200  BP: (!) 154/107 (!) 163/118  Pulse: 88 86  Resp: 18 18  Temp: 98.3 F (36.8 C) 97.7 F (36.5 C)  SpO2: 96% 98%    CONSTITUTIONAL:  well-appearing, NAD NEURO:  Alert and oriented x 3, CN 3-12 grossly intact EYES:  eyes equal and reactive ENT/NECK:  Supple, no stridor  CARDIO:  normal rate, regular rhythm, appears well-perfused  PULM:  No respiratory distress,  GI/GU:  non-distended, left inguinal hernia, easily reducible, no erythema or induration MSK/SPINE:  No gross deformities, no edema, moves all extremities  SKIN:  no rash, atraumatic   *Additional and/or pertinent findings included in MDM below  Diagnostic and Interventional Summary    EKG Interpretation  Date/Time:    Ventricular Rate:    PR Interval:    QRS Duration:   QT Interval:    QTC Calculation:   R Axis:     Text Interpretation:         Labs Reviewed - No data to display  No orders to display    Medications - No data to display   Procedures  /  Critical Care Procedures  ED Course and Medical Decision Making  I have reviewed the triage vital signs, the nursing notes, and pertinent available records from the EMR.  Social Determinants Affecting Complexity of Care: Patient has no clinically significant social determinants affecting this  chief complaint..   ED Course:    Medical Decision Making Patient here for evaluation of left inguinal hernia that is not new, but is becoming painful at times.  It reduces easily on my exam.  No evidence of strangulation or incarceration.  I discussed with the patient that he will need to follow-up with general surgery to have it repaired.  No emergent surgical intervention needed tonight.       Consultants: No consultations were needed in caring for this patient.   Treatment and Plan: I considered admission due to patient's initial presentation, but after considering the examination and diagnostic results, patient will not require admission and can be discharged with outpatient follow-up.    Final Clinical Impressions(s) / ED Diagnoses     ICD-10-CM   1. Left inguinal hernia  K40.90       ED Discharge Orders     None         Discharge Instructions Discussed with and Provided to Patient:   Discharge Instructions   None      Roxy Horseman, Cordelia Poche 10/29/22 2232    Rondel Baton, MD 10/30/22 1052

## 2022-10-29 NOTE — ED Notes (Signed)
Patient verbalizes understanding of discharge instructions. Opportunity for questioning and answers were provided. Armband removed by staff, pt discharged from ED. Pt ambulatory to ED waiting room with steady gait.  

## 2022-10-30 NOTE — ED Notes (Signed)
Pt requesting something to eat at this time.

## 2022-11-05 ENCOUNTER — Telehealth: Payer: Self-pay

## 2022-11-05 NOTE — Telephone Encounter (Signed)
Transition Care Management Unsuccessful Follow-up Telephone Call  Date of discharge and from where:  10/29/2022 [facility]  Attempts:  1st Attempt  Reason for unsuccessful TCM follow-up call:  Left voice message  Irvan Tiedt [facility]  [facility] Population Health Community Resource Care Guide   [email or web address]  ?? [phone]   Website: [email or web address]  [email or web address]

## 2022-11-06 ENCOUNTER — Telehealth: Payer: Self-pay

## 2022-11-06 NOTE — Telephone Encounter (Signed)
Transition Care Management Unsuccessful Follow-up Telephone Call  Date of discharge and from where:  10/29/2022 [facility]  Attempts:  2nd Attempt  Reason for unsuccessful TCM follow-up call:  Left voice message  Frans Valente [facility]  [facility] Population Health Community Resource Care Guide   [email or web address]  ?? [phone]   Website: [email or web address]  [email or web address]

## 2022-11-16 ENCOUNTER — Observation Stay (HOSPITAL_COMMUNITY)
Admission: EM | Admit: 2022-11-16 | Discharge: 2022-11-18 | Disposition: A | Discharge status: Home / Self Care | Payer: Medicare HMO | Attending: Family Medicine | Admitting: Family Medicine

## 2022-11-16 ENCOUNTER — Emergency Department (HOSPITAL_COMMUNITY): Payer: Medicare HMO

## 2022-11-16 ENCOUNTER — Encounter (HOSPITAL_COMMUNITY): Payer: Self-pay

## 2022-11-16 ENCOUNTER — Other Ambulatory Visit: Payer: Self-pay

## 2022-11-16 DIAGNOSIS — I10 Essential (primary) hypertension: Secondary | ICD-10-CM | POA: Diagnosis present

## 2022-11-16 DIAGNOSIS — X58XXXA Exposure to other specified factors, initial encounter: Secondary | ICD-10-CM | POA: Diagnosis not present

## 2022-11-16 DIAGNOSIS — I517 Cardiomegaly: Secondary | ICD-10-CM | POA: Diagnosis not present

## 2022-11-16 DIAGNOSIS — I110 Hypertensive heart disease with heart failure: Secondary | ICD-10-CM | POA: Diagnosis not present

## 2022-11-16 DIAGNOSIS — S61212A Laceration without foreign body of right middle finger without damage to nail, initial encounter: Secondary | ICD-10-CM | POA: Insufficient documentation

## 2022-11-16 DIAGNOSIS — I130 Hypertensive heart and chronic kidney disease with heart failure and stage 1 through stage 4 chronic kidney disease, or unspecified chronic kidney disease: Secondary | ICD-10-CM | POA: Diagnosis not present

## 2022-11-16 DIAGNOSIS — Z5900 Homelessness unspecified: Secondary | ICD-10-CM | POA: Insufficient documentation

## 2022-11-16 DIAGNOSIS — R42 Dizziness and giddiness: Secondary | ICD-10-CM | POA: Diagnosis not present

## 2022-11-16 DIAGNOSIS — S61401A Unspecified open wound of right hand, initial encounter: Secondary | ICD-10-CM | POA: Diagnosis not present

## 2022-11-16 DIAGNOSIS — I5023 Acute on chronic systolic (congestive) heart failure: Principal | ICD-10-CM | POA: Insufficient documentation

## 2022-11-16 DIAGNOSIS — R079 Chest pain, unspecified: Secondary | ICD-10-CM | POA: Diagnosis not present

## 2022-11-16 DIAGNOSIS — F1910 Other psychoactive substance abuse, uncomplicated: Secondary | ICD-10-CM | POA: Diagnosis present

## 2022-11-16 DIAGNOSIS — J449 Chronic obstructive pulmonary disease, unspecified: Secondary | ICD-10-CM | POA: Diagnosis not present

## 2022-11-16 DIAGNOSIS — N189 Chronic kidney disease, unspecified: Secondary | ICD-10-CM | POA: Insufficient documentation

## 2022-11-16 DIAGNOSIS — Z79899 Other long term (current) drug therapy: Secondary | ICD-10-CM | POA: Diagnosis not present

## 2022-11-16 DIAGNOSIS — R739 Hyperglycemia, unspecified: Secondary | ICD-10-CM | POA: Diagnosis present

## 2022-11-16 DIAGNOSIS — I509 Heart failure, unspecified: Secondary | ICD-10-CM

## 2022-11-16 DIAGNOSIS — S61419A Laceration without foreign body of unspecified hand, initial encounter: Secondary | ICD-10-CM

## 2022-11-16 LAB — RAPID URINE DRUG SCREEN, HOSP PERFORMED
Amphetamines: NOT DETECTED
Barbiturates: NOT DETECTED
Benzodiazepines: NOT DETECTED
Cocaine: POSITIVE — AB
Opiates: NOT DETECTED
Tetrahydrocannabinol: NOT DETECTED

## 2022-11-16 LAB — TROPONIN I (HIGH SENSITIVITY)
Troponin I (High Sensitivity): 80 ng/L — ABNORMAL HIGH (ref <18)
Troponin I (High Sensitivity): 78 ng/L — ABNORMAL HIGH (ref <18)

## 2022-11-16 LAB — CBC
HCT: 41.6 % (ref 39.0–52.0)
Hemoglobin: 13.7 g/dL (ref 13.0–17.0)
MCH: 34.7 pg — ABNORMAL HIGH (ref 26.0–34.0)
MCHC: 32.9 g/dL (ref 30.0–36.0)
MCV: 105.3 fL — ABNORMAL HIGH (ref 80.0–100.0)
Platelets: 128 10*3/uL — ABNORMAL LOW (ref 150–400)
RBC: 3.95 MIL/uL — ABNORMAL LOW (ref 4.22–5.81)
RDW: 17.7 % — ABNORMAL HIGH (ref 11.5–15.5)
WBC: 3.5 10*3/uL — ABNORMAL LOW (ref 4.0–10.5)
nRBC: 0 % (ref 0.0–0.2)

## 2022-11-16 LAB — BASIC METABOLIC PANEL
Anion gap: 9 (ref 5–15)
BUN: 21 mg/dL — ABNORMAL HIGH (ref 6–20)
CO2: 23 mmol/L (ref 22–32)
Calcium: 8.3 mg/dL — ABNORMAL LOW (ref 8.9–10.3)
Chloride: 105 mmol/L (ref 98–111)
Creatinine, Ser: 1.55 mg/dL — ABNORMAL HIGH (ref 0.61–1.24)
GFR, Estimated: 54 mL/min — ABNORMAL LOW (ref >60)
Glucose, Bld: 233 mg/dL — ABNORMAL HIGH (ref 70–99)
Potassium: 4.2 mmol/L (ref 3.5–5.1)
Sodium: 137 mmol/L (ref 135–145)

## 2022-11-16 LAB — HEPATIC FUNCTION PANEL
ALT: 29 U/L (ref 0–44)
AST: 34 U/L (ref 15–41)
Albumin: 2.9 g/dL — ABNORMAL LOW (ref 3.5–5.0)
Alkaline Phosphatase: 75 U/L (ref 38–126)
Bilirubin, Direct: 0.3 mg/dL — ABNORMAL HIGH (ref 0.0–0.2)
Indirect Bilirubin: 1.3 mg/dL — ABNORMAL HIGH (ref 0.3–0.9)
Total Bilirubin: 1.6 mg/dL — ABNORMAL HIGH (ref 0.3–1.2)
Total Protein: 6.3 g/dL — ABNORMAL LOW (ref 6.5–8.1)

## 2022-11-16 LAB — BRAIN NATRIURETIC PEPTIDE: B Natriuretic Peptide: 2392.5 pg/mL — ABNORMAL HIGH (ref 0.0–100.0)

## 2022-11-16 LAB — CK: Total CK: 311 U/L (ref 49–397)

## 2022-11-16 LAB — MAGNESIUM: Magnesium: 1.8 mg/dL (ref 1.7–2.4)

## 2022-11-16 MED ORDER — ENOXAPARIN SODIUM 40 MG/0.4ML IJ SOSY: 40.0000 mg | PREFILLED_SYRINGE | INTRAMUSCULAR | Status: DC

## 2022-11-16 MED ORDER — LORAZEPAM 1 MG PO TABS
0.0000 mg | ORAL_TABLET | Freq: Four times a day (QID) | ORAL | Status: DC
  Administered 2022-11-16: 3 mg via ORAL
  Administered 2022-11-17: 1 mg via ORAL
  Filled 2022-11-16: qty 1
  Filled 2022-11-16: qty 3

## 2022-11-16 MED ORDER — AMOXICILLIN-POT CLAVULANATE 875-125 MG PO TABS
1.0000 | ORAL_TABLET | Freq: Two times a day (BID) | ORAL | Status: DC
  Administered 2022-11-16 – 2022-11-18 (×5): 1 via ORAL
  Filled 2022-11-16 (×6): qty 1

## 2022-11-16 MED ORDER — ADULT MULTIVITAMIN W/MINERALS CH
1.0000 | ORAL_TABLET | Freq: Every day | ORAL | Status: DC
  Administered 2022-11-16 – 2022-11-18 (×3): 1 via ORAL
  Filled 2022-11-16 (×3): qty 1

## 2022-11-16 MED ORDER — LORAZEPAM 1 MG PO TABS: 0.0000 mg | ORAL_TABLET | Freq: Two times a day (BID) | ORAL | Status: DC

## 2022-11-16 MED ORDER — POLYETHYLENE GLYCOL 3350 17 G PO PACK: 17.0000 g | PACK | Freq: Every day | ORAL | Status: DC | PRN

## 2022-11-16 MED ORDER — ONDANSETRON HCL 4 MG PO TABS: 4.0000 mg | ORAL_TABLET | Freq: Four times a day (QID) | ORAL | Status: DC | PRN

## 2022-11-16 MED ORDER — ONDANSETRON HCL 4 MG/2ML IJ SOLN: 4.0000 mg | Freq: Four times a day (QID) | INTRAMUSCULAR | Status: DC | PRN

## 2022-11-16 MED ORDER — FOLIC ACID 1 MG PO TABS
1.0000 mg | ORAL_TABLET | Freq: Every day | ORAL | Status: DC
  Administered 2022-11-16 – 2022-11-18 (×3): 1 mg via ORAL
  Filled 2022-11-16 (×3): qty 1

## 2022-11-16 MED ORDER — THIAMINE MONONITRATE 100 MG PO TABS
100.0000 mg | ORAL_TABLET | Freq: Every day | ORAL | Status: DC
  Administered 2022-11-16 – 2022-11-18 (×3): 100 mg via ORAL
  Filled 2022-11-16 (×3): qty 1

## 2022-11-16 MED ORDER — ACETAMINOPHEN 650 MG RE SUPP: 650.0000 mg | Freq: Four times a day (QID) | RECTAL | Status: DC | PRN

## 2022-11-16 MED ORDER — FUROSEMIDE 10 MG/ML IJ SOLN
40.0000 mg | Freq: Once | INTRAMUSCULAR | Status: AC
  Administered 2022-11-16: 40 mg via INTRAVENOUS
  Filled 2022-11-16: qty 4

## 2022-11-16 MED ORDER — ACETAMINOPHEN 325 MG PO TABS
650.0000 mg | ORAL_TABLET | Freq: Four times a day (QID) | ORAL | Status: DC | PRN
  Administered 2022-11-17: 650 mg via ORAL
  Filled 2022-11-16 (×2): qty 2

## 2022-11-16 NOTE — ED Notes (Signed)
Pt ambulated to the bathroom with a steady gait.

## 2022-11-16 NOTE — Progress Notes (Signed)
     Daily Progress Note Intern Pager: [phone]  Patient name: Charles [name] Medical record number: [id number] Date of birth: 07-15-1970 Age: 52 y.o. Gender: male  Primary Care Provider: Renaye Rakers, MD Consultants: None Code Status: Full  Pt Overview and Major Events to Date:  7/6/: Admitted  Assessment and Plan: Charles [name] is a 52 y.o. male presented with shortness of breath is currently being treated for CHF exacerbation secondary to medication and accessibility. Pertinent PMH/PSH includes CHF, pulmonary edema, HTN, polysubstance use.  Hospital Problem List      Hospital     * (Principal) CHF exacerbation (HCC)     Responded well to Lasix 40 mg IV with 1.5 L output. -Redose Lasix 40 mg IV - Regular diet, fluid restriction 1500 - PT/OT to treat - AM BMP - daily weights        Polysubstance abuse (HCC)     UDS positive for cocaine.  CIWA 7> - CIWA with Ativan - monitor for signs of withdrawal - TOC consult for substance abuse and PCP needs         Laceration of hand     Laceration to R 3rd finger. Xray clear for fracture or foreign body. - Continue Augmentin (7/6 - )      FEN/GI: Regular diet PPx: SCDs Dispo:{FPTSDISOLIST:27587} {FPTSDISOTIME:27588}. Barriers include ***.   Subjective:  ***  Objective: Temp:  [98.1 F (36.7 C)-98.7 F (37.1 C)] 98.6 F (37 C) (07/06 1915) Pulse Rate:  [88-97] 90 (07/06 2100) Resp:  [14-22] 19 (07/06 1915) BP: (133-155)/(86-110) 145/86 (07/06 2100) SpO2:  [94 %-99 %] 94 % (07/06 1915) Weight:  [66 kg-68 kg] 66 kg (07/06 1700) Physical Exam: General: *** Cardiovascular: *** Respiratory: *** Abdomen: *** Extremities: ***  Laboratory: Most recent CBC Lab Results  Component Value Date   WBC 3.5 (L) 11/16/2022   HGB 13.7 11/16/2022   HCT 41.6 11/16/2022   MCV 105.3 (H) 11/16/2022   PLT 128 (L) 11/16/2022   Most recent BMP    Latest Ref Rng & Units 11/16/2022   11:10 AM  BMP  Glucose 70 - 99 mg/dL 147    BUN 6 - 20 mg/dL 21   Creatinine 8.29 - 1.24 mg/dL 5.62   Sodium 130 - 865 mmol/L 137   Potassium 3.5 - 5.1 mmol/L 4.2   Chloride 98 - 111 mmol/L 105   CO2 22 - 32 mmol/L 23   Calcium 8.9 - 10.3 mg/dL 8.3    Other pertinent labs ***   Imaging/Diagnostic Tests: No recent imaging results Shelby Mattocks, DO 11/16/2022, 10:59 PM  PGY-3, [facility] FPTS Intern pager: [phone], text pages welcome Secure chat group [facility] Teaching Service

## 2022-11-16 NOTE — ED Triage Notes (Signed)
Pt bib [facility] from home with complaints of dizziness, cp, shob, right middle finger pain, and feeling tired since he woke up this am. Pt states he has been laying on the ground for 2 days and last used cocaine 4 days ago. Non compliant with medications.  AOx4, ambulatory, resp e/u

## 2022-11-16 NOTE — ED Notes (Signed)
Pt resting/ sleeping, arousable to voice, alert, interactive, calm, NAD, given warm blankets and urinals (2).  Denies questions, sx, needs or complaints. Intermittent chewing ice.

## 2022-11-16 NOTE — Assessment & Plan Note (Addendum)
Responded well to Lasix 40 mg IV with 1.5 L output. -Redose Lasix 40 mg IV - Regular diet, fluid restriction 1500 - PT/OT to treat - AM BMP - daily weights

## 2022-11-16 NOTE — Assessment & Plan Note (Signed)
Patient is homeless and reports not taking medications for at least 6 weeks. - monitor response to 40mg  IV Lasix and re-dose as appropriate - Admit to FMTS, attending Dr. Deirdre Priest - MedSurg, Vital signs per floor - Regular diet  - PT/OT to treat - VTE prophylaxis: SCDs - Continue Augmentin (7/6 - ) - AM BMP to monitor K+ - daily weights

## 2022-11-16 NOTE — ED Notes (Signed)
Pt came out of his room to nurses station requesting food. When RN reported that he cannot have anything to eat at this time pt stated "I don't have time for this"
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
## 2022-11-16 NOTE — Assessment & Plan Note (Signed)
Reports cocaine use. Last use was 4 days prior to admission. Denies other illicits or alcohol use. - CIWA without Ativan - monitor for signs of withdrawal

## 2022-11-16 NOTE — ED Notes (Signed)
Pt does have lac to right middle finger that happened a couple days ago. Pt was not seen for this injury when it happened.

## 2022-11-16 NOTE — ED Provider Notes (Signed)
[facility]MENT AT [facility] Provider Note   CSN: [id number] Arrival date & time: 11/16/22  1055     History  Chief Complaint  Patient presents with   Dizziness    Arin Tapscott is a 52 y.o. male.   Dizziness Associated symptoms: chest pain, shortness of breath and weakness (Generalized)   Patient presenting for multiple complaints.  Medical history includes CHF, cocaine abuse, HTN, CKD, COPD, seizures, sleep apnea, homelessness.  Last cocaine use was 4 days ago.  Patient reports that he has not taken any medication since his last hospitalization.  Per chart review, that was 6 weeks ago.  Over the past 2 days, patient has had fatigue, generalized weakness, intermittent shortness of breath, chest pain, and diffuse myalgias.  Because of the symptoms, he states that he has been laying on the ground, not eating and not drinking.  Patient is homeless and stays on a mattress between 2 buildings.  He states that he is not able to get any medications because he does not have transportation.  He has dizziness with standing and exertional shortness of breath.  He also sustained a wound to the third digit of his right hand 1 week ago from some broken glass.  He denies any drainage from the area.  Wound has had increasing pain and swelling.    Home Medications Prior to Admission medications   Medication Sig Start Date End Date Taking? Authorizing Provider  empagliflozin (JARDIANCE) 10 MG TABS tablet Take 1 tablet (10 mg total) by mouth daily. Patient not taking: Reported on 11/16/2022 10/18/22 01/16/23  Maxwell Marion, PA-C  furosemide (LASIX) 40 MG tablet Take 1 tablet (40 mg total) by mouth daily. Take an additional tablet for shortness of breath, leg swelling, or weight gain of 3 lbs in a day or 5 lbs in a week. Patient not taking: Reported on 11/16/2022 10/18/22 01/16/23  Maxwell Marion, PA-C  losartan (COZAAR) 100 MG tablet Take 1 tablet (100 mg total) by mouth daily. Patient  not taking: Reported on 11/16/2022 10/18/22 01/16/23  Maxwell Marion, PA-C  spironolactone (ALDACTONE) 25 MG tablet Take 0.5 tablets (12.5 mg total) by mouth daily. Patient not taking: Reported on 11/16/2022 10/18/22 01/16/23  Maxwell Marion, PA-C  tiotropium (SPIRIVA HANDIHALER) 18 MCG inhalation capsule Place 1 capsule (18 mcg total) into inhaler and inhale daily. Patient not taking: Reported on 11/16/2022 10/18/22 01/16/23  Maxwell Marion, PA-C      Allergies    Egg-derived products, Banana, and Pork-derived products    Review of Systems   Review of Systems  Constitutional:  Positive for activity change, appetite change and fatigue.  Respiratory:  Positive for shortness of breath.   Cardiovascular:  Positive for chest pain.  Musculoskeletal:  Positive for myalgias.  Skin:  Positive for wound.  Neurological:  Positive for dizziness and weakness (Generalized).  All other systems reviewed and are negative.   Physical Exam Updated Vital Signs BP (!) 145/86 (BP Location: Left Arm)   Pulse 88   Temp 98.6 F (37 C) (Oral)   Resp 19   Ht 5\' 6"  (1.676 m)   Wt 66 kg   SpO2 94%   BMI 23.48 kg/m  Physical Exam Vitals and nursing note reviewed.  Constitutional:      General: He is not in acute distress.    Appearance: Normal appearance. He is well-developed. He is not ill-appearing, toxic-appearing or diaphoretic.  HENT:     Head: Normocephalic and atraumatic.  Right Ear: External ear normal.     Left Ear: External ear normal.     Nose: Nose normal.     Mouth/Throat:     Mouth: Mucous membranes are moist.  Eyes:     Extraocular Movements: Extraocular movements intact.     Conjunctiva/sclera: Conjunctivae normal.  Cardiovascular:     Rate and Rhythm: Normal rate and regular rhythm.     Heart sounds: No murmur heard. Pulmonary:     Effort: Pulmonary effort is normal. No respiratory distress.     Breath sounds: Normal breath sounds. No wheezing or rales.  Chest:     Chest wall: No tenderness.   Abdominal:     General: There is no distension.     Palpations: Abdomen is soft.     Tenderness: There is no abdominal tenderness.  Musculoskeletal:        General: No swelling. Normal range of motion.     Cervical back: Normal range of motion and neck supple.     Right lower leg: No edema.     Left lower leg: No edema.  Skin:    General: Skin is warm and dry.     Coloration: Skin is not jaundiced or pale.  Neurological:     General: No focal deficit present.     Mental Status: He is alert and oriented to person, place, and time.  Psychiatric:        Mood and Affect: Mood normal.        Behavior: Behavior normal.     ED Results / Procedures / Treatments   Labs (all labs ordered are listed, but only abnormal results are displayed) Labs Reviewed  BASIC METABOLIC PANEL - Abnormal; Notable for the following components:      Result Value   Glucose, Bld 233 (*)    BUN 21 (*)    Creatinine, Ser 1.55 (*)    Calcium 8.3 (*)    GFR, Estimated 54 (*)    All other components within normal limits  CBC - Abnormal; Notable for the following components:   WBC 3.5 (*)    RBC 3.95 (*)    MCV 105.3 (*)    MCH 34.7 (*)    RDW 17.7 (*)    Platelets 128 (*)    All other components within normal limits  HEPATIC FUNCTION PANEL - Abnormal; Notable for the following components:   Total Protein 6.3 (*)    Albumin 2.9 (*)    Total Bilirubin 1.6 (*)    Bilirubin, Direct 0.3 (*)    Indirect Bilirubin 1.3 (*)    All other components within normal limits  BRAIN NATRIURETIC PEPTIDE - Abnormal; Notable for the following components:   B Natriuretic Peptide 2,392.5 (*)    All other components within normal limits  RAPID URINE DRUG SCREEN, HOSP PERFORMED - Abnormal; Notable for the following components:   Cocaine POSITIVE (*)    All other components within normal limits  TROPONIN I (HIGH SENSITIVITY) - Abnormal; Notable for the following components:   Troponin I (High Sensitivity) 80 (*)    All  other components within normal limits  TROPONIN I (HIGH SENSITIVITY) - Abnormal; Notable for the following components:   Troponin I (High Sensitivity) 78 (*)    All other components within normal limits  MAGNESIUM  CK  BASIC METABOLIC PANEL  HIV ANTIBODY (ROUTINE TESTING W REFLEX)    EKG None  Radiology DG Chest 2 View  Result Date: 11/16/2022 CLINICAL DATA:  Chest pain EXAM: CHEST - 2 VIEW COMPARISON:  Chest radiograph 10/29/2018 FINDINGS: The heart is enlarged, unchanged. The upper mediastinal contours are normal. There is no other focal consolidation or pulmonary edema. There is no pleural effusion or pneumothorax There is no acute osseous abnormality. Surgical material is noted in the left upper quadrant. IMPRESSION: Unchanged cardiomegaly. No radiographic evidence of acute cardiopulmonary pathology. Electronically Signed   By: Lesia Hausen M.D.   On: 11/16/2022 11:48   DG Hand 2 View Right  Result Date: 11/16/2022 CLINICAL DATA:  Right hand wound.  Third digit. EXAM: RIGHT HAND - 2 VIEW COMPARISON:  None Available. FINDINGS: No signs of acute fracture, dislocation or focal bone erosion. Soft tissue edema and irregularity around the third proximal phalanx is identified. No underlying radiopaque foreign body. Mild scattered degenerative changes are noted involving the DIP joints. IMPRESSION: Soft tissue edema and irregularity around the third proximal phalanx. No underlying radiopaque foreign body. No acute bony abnormality. Electronically Signed   By: Signa Kell M.D.   On: 11/16/2022 11:47    Procedures Procedures    Medications Ordered in ED Medications  amoxicillin-clavulanate (AUGMENTIN) 875-125 MG per tablet 1 tablet (1 tablet Oral Given 11/16/22 1422)  acetaminophen (TYLENOL) tablet 650 mg (has no administration in time range)    Or  acetaminophen (TYLENOL) suppository 650 mg (has no administration in time range)  polyethylene glycol (MIRALAX / GLYCOLAX) packet 17 g (has no  administration in time range)  ondansetron (ZOFRAN) tablet 4 mg (has no administration in time range)    Or  ondansetron (ZOFRAN) injection 4 mg (has no administration in time range)  thiamine (VITAMIN B1) tablet 100 mg (100 mg Oral Given 11/16/22 1537)  multivitamin with minerals tablet 1 tablet (1 tablet Oral Given 11/16/22 1537)  folic acid (FOLVITE) tablet 1 mg (1 mg Oral Given 11/16/22 1537)  LORazepam (ATIVAN) tablet 0-4 mg (has no administration in time range)    Followed by  LORazepam (ATIVAN) tablet 0-4 mg (has no administration in time range)  furosemide (LASIX) injection 40 mg (40 mg Intravenous Given 11/16/22 1424)    ED Course/ Medical Decision Making/ A&P                             Medical Decision Making Amount and/or Complexity of Data Reviewed Labs: ordered. Radiology: ordered.  Risk Prescription drug management. Decision regarding hospitalization.   This patient presents to the ED for concern of multiple complaints, this involves an extensive number of treatment options, and is a complaint that carries with it a high risk of complications and morbidity.  The differential diagnosis includes CHF exacerbation, COPD exacerbation, dehydration, metabolic derangements, deconditioning, drug abuse   Co morbidities that complicate the patient evaluation  CHF, cocaine abuse, HTN, CKD, COPD, seizures, sleep apnea, homelessness   Additional history obtained:  Additional history obtained from N/A External records from outside source obtained and reviewed including EMR   Lab Tests:  I Ordered, and personally interpreted labs.  The pertinent results include: Baseline creatinine, baseline leukopenia, normal electrolytes, elevated troponin and BNP consistent with CHF exacerbation.   Imaging Studies ordered:  I ordered imaging studies including x-ray of right hand, chest x-ray I independently visualized and interpreted imaging which showed no opaque foreign bodies on right  third digit, soft tissue swelling only; chest x-ray shows baseline cardiomegaly I agree with the radiologist interpretation   Cardiac Monitoring: / EKG:  The patient was  maintained on a cardiac monitor.  I personally viewed and interpreted the cardiac monitored which showed an underlying rhythm of: Sinus rhythm  Problem List / ED Course / Critical interventions / Medication management  Patient presents for multiple complaints.  Among these are severe fatigue, generalized weakness, and intermittent shortness of breath.  He is currently homeless and describes the last 2 days is simply laying on the ground, too fatigued to get up.  He states that he has not taken any medications since his last hospitalization which was 6 weeks ago.  Per chart review, he has history of CHF with last LVEF of 30 to 35%.  He is prescribed 40 mg of Lasix daily.  On bedside ultrasound, he does have biapical B-lines.  Lab work is notable for elevations in troponin and BNP consistent with CHF exacerbation.  I suspect this is secondary to medication nonadherence.  IV Lasix was ordered.  Patient was also started on Augmentin for soft tissue infection of his right third digit.  Given that this wound is over a week old, wound to heal by second intention.  Patient was admitted for further management..   I ordered medication including Lasix for diuresis; Augmentin for finger wound infection Reevaluation of the patient after these medicines showed that the patient improved I have reviewed the patients home medicines and have made adjustments as needed   Social Determinants of Health:  Currently homeless, ongoing substance abuse, medication nonadherence        Final Clinical Impression(s) / ED Diagnoses Final diagnoses:  Acute on chronic systolic congestive heart failure [facility])    Rx / DC Orders ED Discharge Orders     None         Gloris Manchester, MD 11/16/22 2049

## 2022-11-16 NOTE — Assessment & Plan Note (Addendum)
UDS positive for cocaine.  CIWA 7>16>2 - CIWA with Ativan - monitor for signs of withdrawal - TOC consult for substance abuse and PCP needs

## 2022-11-16 NOTE — Assessment & Plan Note (Signed)
Hyperglycemia on previous BMP of 233.  Prior A1c of 6.1.

## 2022-11-16 NOTE — H&P (Cosign Needed Addendum)
Hospital Admission History and Physical Service Pager: [phone]  Patient name: Charles [name] Medical record number: [id number] Date of Birth: 06/08/1970 Age: 52 y.o. Gender: male  Primary Care Provider: Renaye Rakers, MD Consultants: None Code Status: Full   Chief Complaint: weakness, SOB  Assessment and Plan: Charles [name] is a 52 y.o. male presenting with weakness and shortness of breath for the past few days. Differential for presentation of this includes CHF exacerbation, COPD exacerbation, dehydration, drug use, metabolic derangement.  This is likely a CHF exacerbation 2/2 medication inaccessibility. Patient also has a wound on R hand that is being treated with Augmentin.  Patient appears to be in a difficult cycle of homelessness and medication non-compliance causing chronic ED visits and admissions for diuresis. Last echo on 07/2022 showed LVEF of 30-35%, global hypokinesis and LVH. Will admit for diuresis. Reassuringly on RA without signs of respiratory distress with normal CXR. Troponin down-trending and patient reporting no chest pain.  Patient is a known cocaine user. He emphatically denies other drug use. Will monitor CIWA scores, if they are low for >24 hours will discontinue.   Hospital Problem List      Hospital     * (Principal) CHF exacerbation [facility])     Patient is homeless and reports not taking medications for at least 6  weeks. - monitor response to 40mg  IV Lasix and re-dose as appropriate - Admit to FMTS, attending Dr. Deirdre Priest - MedSurg, Vital signs per floor - Regular diet, fluid restriction 1500 - PT/OT to treat - VTE prophylaxis: SCDs, allergy to heparin in chart  - AM BMP to monitor K+ and kidney function - daily weights        Polysubstance abuse (HCC)     Reports cocaine use. Last use was 4 days prior to admission. Denies  other illicits or alcohol use. - CIWA without Ativan - monitor for signs of withdrawal - TOC consult for substance abuse  and PCP needs         Laceration of hand     Laceration to R 3rd finger. Xray clear for fracture or foreign body. - Continue Augmentin (7/6 - )      FEN/GI: Regular diet VTE Prophylaxis: SCDs  Disposition: MedSurg  History of Present Illness:  Charles [name] is a 52 y.o. male presenting with weakness and shortness of breath. Medical history includes CHF, cocaine abuse, HTN, CKD, COPD, seizures, sleep apnea, homelessness.  He states that he is homeless and has been out of his medications for the past 6 weeks.  He reports some difficulty breathing, denies chest pain.  Denies abdominal pain.  In the ED, patient labs showed creatinine 1.55 (which is consistent with pt's baseline), and electrolytes WNL. Troponin elevated but downtrending. Elevated BNP consistent with CHF exacerbation. CXR showed baseline cardiomegaly. Patient also had xray of right hand which showed no fracture. Patient given 40mg  IV Lasix to begin diuresis. Augmentin started for wound on hand. Called for admission for continued diuresis.   Review Of Systems: Per HPI.  Pertinent Past Medical History: CHF Pulmonary Edema HTN Cocain abuse Remainder reviewed in history tab.   Remainder reviewed in history tab.  Pertinent Social History: Tobacco use: No Alcohol use: No Other Substance use: Cocaine Living situation: patient experiencing homelessness  Important Outpatient Medications: Not taking any medications at this time. Remainder reviewed in medication history.   Objective: BP (!) 155/110   Pulse 92   Temp 98.3 F (36.8  C) (Oral)   Resp (!) 22   Ht 5\' 6"  (1.676 m)   Wt 68 kg   SpO2 96%   BMI 24.21 kg/m  Exam: General: Ill-appearing Cardiovascular: RRR, no murmurs Respiratory: CTA bilaterally.  No extra work of breathing. Gastrointestinal: Abdomen soft, nondistended, nontender.  Normal bowel sounds. Extremities: No peripheral edema Neuro: Alert and oriented, speech normal in content. Psych: Alert,  communicative   Labs:  CBC BMET  Recent Labs  Lab 11/16/22 1110  WBC 3.5*  HGB 13.7  HCT 41.6  PLT 128*   Recent Labs  Lab 11/16/22 1110  NA 137  K 4.2  CL 105  CO2 23  BUN 21*  CREATININE 1.55*  GLUCOSE 233*  CALCIUM 8.3*    Troponin: 80 >>> 78 BNP 2392  EKG: NSR  Imaging Studies Performed: 11/16/22 CXR: Unchanged cardiomegaly. No radiographic evidence of acute cardiopulmonary pathology. 11/16/22 Hand xray: Soft tissue edema and irregularity around the third proximal phalanx. No underlying radiopaque foreign body. No acute bony Abnormality.   Glendale Chard, DO 11/16/2022, 4:01 PM PGY-1, [facility]  FPTS Intern pager: [phone], text pages welcome Secure chat group [facility] Teaching Service

## 2022-11-16 NOTE — Consult Note (Signed)
WOC Nurse Consult Note: Reason for Consult:laceration on right hand, 3rd digit. Injury occurred approximately 1 week ago according to H&P note. X-ray did not indicate any fracture or foreign body. Wound type:trauma Pressure Injury POA: N/A Measurement:Not measured today. See photo uploaded to EMR by Provider Wound bed:red, moist Drainage (amount, consistency, odor) small serosanguinous Periwound:edematous Dressing procedure/placement/frequency:I have provided conservative topical care guidance using a daily soap and water cleanse of the hands followed by a rinse and dry then painting the affected area with a povidone-iodine swabstick and allowing the solution to air dry. When dry, the area is to be dressed with dry gauze and secured with conform bandaging/paper tape.  If desired, consult orthopedics, specifically hand MD for evaluation and a definitive POC and follow up. Any orders provided by a Provider will supercede mine.  WOC nursing team will not follow, but will remain available to this patient, the nursing and medical teams.  Please re-consult if needed.  Thank you for inviting [place] to participate in this patient's Plan of Care.  Ladona Mow, MSN, RN, CNS, GNP, Leda Min, [employer], [employer] phone:  [phone]

## 2022-11-16 NOTE — Progress Notes (Signed)
Approximately 1700--Pt arrived to room 3East02 from ED. Upon arrival, pt A&O x4 and VS obtained. Telemetry connected and notified. Full assessment completed by this RN.

## 2022-11-16 NOTE — Progress Notes (Signed)
FMTS Brief Progress Note  S: Patient endorses being tired and he wants to be left alone to sleep.  O: BP (!) 145/86 (BP Location: Left Arm)   Pulse 88   Temp 98.6 F (37 C) (Oral)   Resp 19   Ht 5\' 6"  (1.676 m)   Wt 66 kg   SpO2 94%   BMI 23.48 kg/m   General: Well-appearing, NAD, conversationally appropriate CV: RRR Pulm: CTAB, normal WOB Extremities: No pitting edema BLEs  A/P: CHF exacerbation Responded well to Lasix, will redose in the morning to allow him to sleep tonight.  Polysubstance abuse UDS results show cocaine therefore likely very recent use. CIWA scores 4>7, no Ativan at this time - Orders reviewed. Labs for AM ordered, which was adjusted as needed.   Charles Mattocks, DO 11/16/2022, 8:27 PM PGY-3, [facility] Night Resident  Please page [phone] with questions.

## 2022-11-16 NOTE — ED Provider Triage Note (Signed)
Emergency Medicine Provider Triage Evaluation Note  Charles [name] , a 52 y.o. male  was evaluated in triage.  Pt complains of multiple complaints.  Review of Systems  Positive: Myalgias, shortness of breath, fatigue, dizziness, finger wound Negative: Fevers, vomiting, diarrhea  Physical Exam  BP (!) 155/110   Pulse 95   Temp 98.1 F (36.7 C) (Oral)   Resp 18   Ht 5\' 6"  (1.676 m)   Wt 68 kg   SpO2 99%   BMI 24.21 kg/m  Gen:   Awake, no distress  Resp:  Normal effort MSK:   Moves extremities without difficulty; wound to third digit of right hand  Medical Decision Making  Medically screening exam initiated at 11:13 AM.  Appropriate orders placed.  Charles [name] was informed that the remainder of the evaluation will be completed by another provider, this initial triage assessment does not replace that evaluation, and the importance of remaining in the ED until their evaluation is complete.  Patient presenting for multiple complaints.  He states that he has been laying on a mat on the floor for the past 2 days.  He has not eaten or drank.  Has not taken medications.  He has dizziness with standing.  He has intermittent shortness of breath.  He has a wound to his right hand, from a cut with glass, which he sustained 1 week ago.  Wound has become increasingly painful.  He denies any drainage from the wound.  He has not sought medical evaluation for this wound until today.  Workup was initiated.   Charles Manchester, MD 11/16/22 1115

## 2022-11-16 NOTE — Hospital Course (Addendum)
Mr. Charles [name] is a 52 year old male who presented to the emergency department with complaint of weakness and shortness of breath.  He has a past medical history significant for CHF, hypertension, CKD, COPD, seizures cocaine use, and homelessness.    His hospital course is outlined below:  CHF exacerbation Given 40mg  IV Lasix to begin diuresis. Initially put out ***mLs. Lasix re-dosed ***  Polysubstance abuse Patient admits to cocaine use. CIWAs monitored during admission and scores remained 0***. Ativan given ***  Laceration of right hand Patient had laceration of R 3rd finger. Xray showed no fracture or foreign body. Patient was placed on Augmentin ***     PHF Medication non-adherence  LVEF 30-35% - not taking lasix for 1.5 months  Elevated BNP

## 2022-11-16 NOTE — Assessment & Plan Note (Signed)
Laceration to R 3rd finger. Xray clear for fracture or foreign body. - Continue Augmentin (7/6 - )

## 2022-11-17 DIAGNOSIS — I5023 Acute on chronic systolic (congestive) heart failure: Secondary | ICD-10-CM | POA: Diagnosis not present

## 2022-11-17 LAB — BASIC METABOLIC PANEL
Anion gap: 6 (ref 5–15)
Anion gap: 8 (ref 5–15)
BUN: 27 mg/dL — ABNORMAL HIGH (ref 6–20)
BUN: 22 mg/dL — ABNORMAL HIGH (ref 6–20)
CO2: 25 mmol/L (ref 22–32)
CO2: 27 mmol/L (ref 22–32)
Calcium: 8.3 mg/dL — ABNORMAL LOW (ref 8.9–10.3)
Calcium: 8.7 mg/dL — ABNORMAL LOW (ref 8.9–10.3)
Chloride: 103 mmol/L (ref 98–111)
Chloride: 100 mmol/L (ref 98–111)
Creatinine, Ser: 1.5 mg/dL — ABNORMAL HIGH (ref 0.61–1.24)
Creatinine, Ser: 1.59 mg/dL — ABNORMAL HIGH (ref 0.61–1.24)
GFR, Estimated: 56 mL/min — ABNORMAL LOW (ref >60)
GFR, Estimated: 52 mL/min — ABNORMAL LOW (ref >60)
Glucose, Bld: 122 mg/dL — ABNORMAL HIGH (ref 70–99)
Glucose, Bld: 125 mg/dL — ABNORMAL HIGH (ref 70–99)
Potassium: 3.9 mmol/L (ref 3.5–5.1)
Potassium: 4 mmol/L (ref 3.5–5.1)
Sodium: 134 mmol/L — ABNORMAL LOW (ref 135–145)
Sodium: 135 mmol/L (ref 135–145)

## 2022-11-17 LAB — HEMOGLOBIN A1C
Hgb A1c MFr Bld: 5.7 % — ABNORMAL HIGH (ref 4.8–5.6)
Mean Plasma Glucose: 116.89 mg/dL

## 2022-11-17 LAB — MAGNESIUM: Magnesium: 2.2 mg/dL (ref 1.7–2.4)

## 2022-11-17 LAB — HIV ANTIBODY (ROUTINE TESTING W REFLEX): HIV Screen 4th Generation wRfx: NONREACTIVE

## 2022-11-17 MED ORDER — FUROSEMIDE 40 MG PO TABS: 40.0000 mg | ORAL_TABLET | Freq: Every day | ORAL | Status: DC

## 2022-11-17 MED ORDER — FUROSEMIDE 10 MG/ML IJ SOLN
40.0000 mg | Freq: Once | INTRAMUSCULAR | Status: AC
  Administered 2022-11-17: 40 mg via INTRAVENOUS
  Filled 2022-11-17: qty 4

## 2022-11-17 MED ORDER — LOSARTAN POTASSIUM 50 MG PO TABS
100.0000 mg | ORAL_TABLET | Freq: Every day | ORAL | Status: DC
  Administered 2022-11-17 – 2022-11-18 (×2): 100 mg via ORAL
  Filled 2022-11-17 (×2): qty 2

## 2022-11-17 MED ORDER — EMPAGLIFLOZIN 10 MG PO TABS
10.0000 mg | ORAL_TABLET | Freq: Every day | ORAL | Status: DC
  Administered 2022-11-17 – 2022-11-18 (×2): 10 mg via ORAL
  Filled 2022-11-17 (×2): qty 1

## 2022-11-17 MED ORDER — SPIRONOLACTONE 12.5 MG HALF TABLET
12.5000 mg | ORAL_TABLET | Freq: Every day | ORAL | Status: DC
  Administered 2022-11-17 – 2022-11-18 (×2): 12.5 mg via ORAL
  Filled 2022-11-17 (×2): qty 1

## 2022-11-17 NOTE — Care Management Obs Status (Signed)
MEDICARE OBSERVATION STATUS NOTIFICATION   Patient Details  Name: Charles [name] MRN: [id number] Date of Birth: [birth year]   Medicare Observation Status Notification Given:  Yes    Lawerance Sabal, RN 11/17/2022, 4:06 PM

## 2022-11-17 NOTE — Plan of Care (Signed)

## 2022-11-17 NOTE — Progress Notes (Signed)
OT Cancellation Note  Patient Details Name: Charles [name] MRN: [id number] DOB: May 02, 1971   Cancelled Treatment:    Reason Eval/Treat Not Completed: Patient declined, no reason specified Will check on pt next day  Lise Auer, OT Acute Rehabilitation Services  [phone], [name] 11/17/2022, 2:45 PM
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
## 2022-11-17 NOTE — Progress Notes (Signed)
Pt refusing bed alarm at this time. Education provided by this RN regarding fall precautions and safety. Pt states he "doesn't need it." Consulting civil engineer and MD made aware.

## 2022-11-17 NOTE — Progress Notes (Signed)
Patient noncompliant with fluid restriction throughout shift. Education provided to patient regarding importance of fluid restriction and need for IV diuresis-- pt states that he "doesn't care" and will "drink out of the sink" if he is thirsty. RN to continue to reinforce need for fluid restriction. RN to monitor pt's intake and record accurate I & Os.

## 2022-11-18 ENCOUNTER — Other Ambulatory Visit (HOSPITAL_COMMUNITY): Payer: Self-pay

## 2022-11-18 DIAGNOSIS — I5023 Acute on chronic systolic (congestive) heart failure: Secondary | ICD-10-CM | POA: Diagnosis not present

## 2022-11-18 LAB — BASIC METABOLIC PANEL
Anion gap: 8 (ref 5–15)
BUN: 25 mg/dL — ABNORMAL HIGH (ref 6–20)
CO2: 27 mmol/L (ref 22–32)
Calcium: 8.7 mg/dL — ABNORMAL LOW (ref 8.9–10.3)
Chloride: 102 mmol/L (ref 98–111)
Creatinine, Ser: 1.33 mg/dL — ABNORMAL HIGH (ref 0.61–1.24)
GFR, Estimated: >60 mL/min (ref >60)
Glucose, Bld: 129 mg/dL — ABNORMAL HIGH (ref 70–99)
Potassium: 3.8 mmol/L (ref 3.5–5.1)
Sodium: 137 mmol/L (ref 135–145)

## 2022-11-18 LAB — MAGNESIUM: Magnesium: 2.2 mg/dL (ref 1.7–2.4)

## 2022-11-18 MED ORDER — LOSARTAN POTASSIUM 100 MG PO TABS
100.0000 mg | ORAL_TABLET | Freq: Every day | ORAL | 0 refills | Status: DC
  Filled 2022-11-18: qty 30, 30d supply, fill #0

## 2022-11-18 MED ORDER — AMOXICILLIN-POT CLAVULANATE 875-125 MG PO TABS
1.0000 | ORAL_TABLET | Freq: Two times a day (BID) | ORAL | 0 refills | Status: AC
  Filled 2022-11-18: qty 10, 5d supply, fill #0

## 2022-11-18 MED ORDER — EMPAGLIFLOZIN 10 MG PO TABS
10.0000 mg | ORAL_TABLET | Freq: Every day | ORAL | 0 refills | Status: DC
  Filled 2022-11-18: qty 30, 30d supply, fill #0

## 2022-11-18 MED ORDER — SPIRONOLACTONE 25 MG PO TABS
12.5000 mg | ORAL_TABLET | Freq: Every day | ORAL | 0 refills | Status: DC
  Filled 2022-11-18: qty 30, 60d supply, fill #0

## 2022-11-18 MED ORDER — FUROSEMIDE 40 MG PO TABS
40.0000 mg | ORAL_TABLET | Freq: Every day | ORAL | 0 refills | Status: DC
  Filled 2022-11-18: qty 30, 30d supply, fill #0

## 2022-11-18 MED ORDER — FUROSEMIDE 40 MG PO TABS
40.0000 mg | ORAL_TABLET | Freq: Every day | ORAL | Status: DC
  Administered 2022-11-18: 40 mg via ORAL
  Filled 2022-11-18: qty 1

## 2022-11-18 NOTE — Assessment & Plan Note (Signed)
Output O/N 800 mL, with total output of 3250 ml. Weight 63.8 kg, down from 65.7 kg on admission. Patient educated regarding medication adherence, patient voiced understanding, will need medication at bedside upon d/c to encourage compliance.  - Restart Lasix 40 mg PO, patient home dose  - Continue home jardiance  - Regular diet, fluid restriction 1500 - PT/OT to treat - AM BMP - daily weights

## 2022-11-18 NOTE — Progress Notes (Signed)
Mobility Specialist Progress Note:   11/18/22 1020  Mobility  Activity Ambulated independently in room  Level of Assistance Independent  Assistive Device None  Distance Ambulated (ft) 50 ft  Activity Response Tolerated well  Mobility Referral Yes  $Mobility charge 1 Mobility  Mobility Specialist Start Time (ACUTE ONLY) 1020  Mobility Specialist Stop Time (ACUTE ONLY) 1026  Mobility Specialist Time Calculation (min) (ACUTE ONLY) 6 min   Pt reluctant to participate in mobility session. Required max encouragement to ambulate only in room. No physical assistance throughout. VSS on RA. Pt left back in bed with all needs met.   Addison Lank Mobility Specialist Please contact via SecureChat or  Rehab office at [phone]

## 2022-11-18 NOTE — Progress Notes (Signed)
Heart Failure Navigator Progress Note  Assessed for Heart & Vascular TOC clinic readiness.  Patient does not meet criteria due to [facility] patient.   Navigator will sign off at this time.   Narcissa Melder, BSN, RN Heart Failure Nurse Navigator Secure Chat Only   

## 2022-11-18 NOTE — Care Management (Signed)
Niece Lissa Merlin will pick up patient when she gets off work around 5:30-6pm.

## 2022-11-18 NOTE — Assessment & Plan Note (Signed)
Laceration to R 3rd finger. Xray clear for fracture or foreign body. Finger wrapped, dressing clean.  - Continue Augmentin x 5 days (7/6 - )

## 2022-11-18 NOTE — Assessment & Plan Note (Signed)
UDS positive for cocaine.  CIWA 1 O/N.  - CIWA with Ativan - monitor for signs of withdrawal - TOC consult for substance abuse and PCP needs

## 2022-11-18 NOTE — Discharge Summary (Cosign Needed Addendum)
Family Medicine Teaching [facility] Discharge Summary  Patient name: Charles [name] Medical record number: [id number] Date of birth: 08/20/1970 Age: 52 y.o. Gender: male Date of Admission: 11/16/2022  Date of Discharge: 11/18/22  Admitting Physician: Carney Living, MD  Primary Care Provider: Renaye Rakers, MD Consultants: WOC  Indication for Hospitalization: CHF exacerbation 2/2 to medication noncompliance.   Discharge Diagnoses/Problem List:  Principal Problem for Admission: CHF exacerbation  Other Problems addressed during stay:  Principal Problem:   CHF exacerbation (HCC) Active Problems:   Polysubstance abuse (HCC)   Essential hypertension   Laceration of hand    Brief Hospital Course:  Charles [name] is a 52 year old male who presented to the emergency department with complaint of weakness and shortness of breath.  He has a past medical history significant for CHF, hypertension, CKD, COPD, seizures cocaine use, and homelessness.    His hospital course is outlined below:  CHF exacerbation Admitted for diuresis secondary to medication non-compliance due to being homeless. Continued IV Lasix 40 mg diuresis with total output of 5580 mL during admission. Restarted patients home jardiance and Lasix 40 mg PO prior to discharge. Patient euvolemic on discharge with dry weight of 140.7 lbs. TOC assisted with medications and outpatient follow up.   Polysubstance abuse Patient admits to cocaine use. CIWAs monitored during admission and scores 4 >7> 16> 2 >>> 0 on discharge. Patient did no show any signs of withdrawal on discharge.   Laceration of right hand Patient had laceration of R 3rd finger. Xray showed no fracture or foreign body. Patient was placed on Augmentin and at discharge was prescribed and additional for a 5 day course, to finish 7/12.    Disposition: Patient to be picked up by niece.   Discharge Condition: Stable  Issues for Follow Up:  Exam finger  laceration and monitor for signs of infection Screen for medication compliance    Discharge Exam:    Physical Exam Vitals reviewed.  Constitutional:      Appearance: Normal appearance.  Cardiovascular:     Rate and Rhythm: Normal rate and regular rhythm.     Pulses: Normal pulses.     Heart sounds: Normal heart sounds.  Pulmonary:     Effort: Pulmonary effort is normal.     Breath sounds: Normal breath sounds.  Abdominal:     General: Bowel sounds are normal.     Palpations: Abdomen is soft.  Musculoskeletal:     Comments: Laceration to finger wrapped in clean dressing   Neurological:     General: No focal deficit present.     Mental Status: He is alert and oriented to person, place, and time.     Vitals:   11/18/22 0420 11/18/22 0755  BP: (!) 133/91 (!) 145/87  Pulse: 80 82  Resp: 20 20  Temp: (!) 97.5 F (36.4 C) 97.8 F (36.6 C)  SpO2: 96% 100%    Significant Procedures: N/A  Significant Labs and Imaging:  No results for input(s): "WBC", "HGB", "HCT", "PLT" in the last 48 hours. Recent Labs  Lab 11/17/22 0036 11/17/22 1454 11/18/22 0036  NA 134* 135 137  K 3.9 4.0 3.8  CL 103 100 102  CO2 25 27 27   GLUCOSE 122* 125* 129*  BUN 27* 22* 25*  CREATININE 1.50* 1.59* 1.33*  CALCIUM 8.3* 8.7* 8.7*  MG  --  2.2 2.2     Pertinent Imaging    Results/Tests Pending at Time of Discharge: N/A  Discharge  Medications:  Allergies as of 11/18/2022       Reactions   Egg-derived Products Nausea And Vomiting   Banana Nausea And Vomiting   Pork-derived Products Nausea And Vomiting        Medication List     TAKE these medications    amoxicillin-clavulanate 875-125 MG tablet Commonly known as: AUGMENTIN Take 1 tablet by mouth every 12 (twelve) hours for 5 days.   furosemide 40 MG tablet Commonly known as: LASIX Take 1 tablet (40 mg total) by mouth daily. What changed: additional instructions   Jardiance 10 MG Tabs tablet Generic drug:  empagliflozin Take 1 tablet (10 mg total) by mouth daily. Start taking on: November 19, 2022   losartan 100 MG tablet Commonly known as: COZAAR Take 1 tablet (100 mg total) by mouth daily.   spironolactone 25 MG tablet Commonly known as: ALDACTONE Take 0.5 tablets (12.5 mg total) by mouth daily. Start taking on: November 19, 2022   tiotropium 18 MCG inhalation capsule Commonly known as: Spiriva HandiHaler Place 1 capsule (18 mcg total) into inhaler and inhale daily.        Discharge Instructions: Please refer to Patient Instructions section of EMR for full details.  Patient was counseled important signs and symptoms that should prompt return to medical care, changes in medications, dietary instructions, activity restrictions, and follow up appointments.   Follow-Up Appointments:   Cyd Silence, MD 11/18/2022, 1:50 PM PGY-1, [facility]
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
## 2022-11-18 NOTE — Progress Notes (Signed)
IV removed, AVS paperwork given to pt. Pt did not want to go over paperwork with RN. Discharge meds given to pt. Pt is waiting for his niece to arrive and take him home

## 2022-11-18 NOTE — Discharge Instructions (Addendum)
Dear Charles [name],  Thank you for letting us participate in your care. You were hospitalized for increased shortness of breath and diagnosed with CHF exacerbation (HCC) and a finger laceration. You were treated with furosemide for your heart failure and given antibiotics for the laceration of your hand.   POST-HOSPITAL & CARE INSTRUCTIONS Please take FULL COURSE of antibiotics as listed on bottle. Go to your follow up appointments (listed below)   DOCTOR'S APPOINTMENT   No future appointments.   Take care and be well!  Family Medicine Teaching Service Inpatient Team [place]  [facility]  [address] [place], [place] [address] [phone]

## 2022-11-18 NOTE — Progress Notes (Signed)
Pt's niece called the unit saying that she is at the exit. Pt was taken out to meet her via wheelchair by a NT.

## 2022-11-18 NOTE — Assessment & Plan Note (Signed)
Patient has hx of noncompliance with medication.  - continue home dose losartan and spironolactone

## 2022-11-18 NOTE — Evaluation (Signed)
Occupational Therapy Evaluation Patient Details Name: Charles [name] MRN: [id number] DOB: [birth year] Today's Date: 11/18/2022   History of Present Illness The pt is a 52 yo male presenting 7/6 with dizziness, chest pain, SOB, and fatigue. Pt states he has been laying on the ground for 2 days after using cocaine. Pt also with laceration of R middle finger, found to be infected. Work up revealed CHF exacerbation, pt admitted for medical management. PMH includes: CHF, polysubstance abuse (cocaine, opiates, THC, benzo's), HTN, CKD, COPD, seizures, sleep apnea, and homelessness.   Clinical Impression   Pt is functioning modified independently. Impulsive and without regard for fluid restriction. Pain in R middle finger so uses mouth to open packages, but can use functionally for self feeding with utensil. No OT needs.      Recommendations for follow up therapy are one component of a multi-disciplinary discharge planning process, led by the attending physician.  Recommendations may be updated based on patient status, additional functional criteria and insurance authorization.   Assistance Recommended at Discharge None  Patient can return home with the following      Functional Status Assessment  Patient has not had a recent decline in their functional status  Equipment Recommendations  None recommended by OT    Recommendations for Other Services       Precautions / Restrictions Precautions Precautions: Fall Restrictions Weight Bearing Restrictions: No      Mobility Bed Mobility Overal bed mobility: Independent             General bed mobility comments: bed flat    Transfers Overall transfer level: Independent Equipment used: None                      Balance     Sitting balance-Leahy Scale: Normal       Standing balance-Leahy Scale: Fair                             ADL either performed or assessed with clinical judgement   ADL Overall ADL's :  Modified independent                                       General ADL Comments: using mouth to open packages     Vision Ability to See in Adequate Light: 0 Adequate Patient Visual Report: No change from baseline       Perception     Praxis      Pertinent Vitals/Pain Pain Assessment Pain Assessment: Faces Pain Location: R middle finger Pain Descriptors / Indicators: Sore Pain Intervention(s): Monitored during session, Repositioned     Hand Dominance Right   Extremity/Trunk Assessment Upper Extremity Assessment Upper Extremity Assessment: RUE deficits/detail RUE Deficits / Details: laceration of R middle finger       Cervical / Trunk Assessment Cervical / Trunk Assessment: Normal   Communication Communication Communication: No difficulties   Cognition Arousal/Alertness: Awake/alert Behavior During Therapy: Impulsive Overall Cognitive Status: No family/caregiver present to determine baseline cognitive functioning                                       General Comments       Exercises     Shoulder Instructions  Home Living Family/patient expects to be discharged to:: Shelter/Homeless                                 Additional Comments: pt states he sleeps on a mattress between two buildings, may be able to stay with his neice but is unsure, hasnt asked her      Prior Functioning/Environment Prior Level of Function : Independent/Modified Independent                        OT Problem List:        OT Treatment/Interventions:      OT Goals(Current goals can be found in the care plan section)    OT Frequency:      Co-evaluation              AM-PAC OT "6 Clicks" Daily Activity     Outcome Measure Help from another person eating meals?: None Help from another person taking care of personal grooming?: None Help from another person toileting, which includes using toliet, bedpan, or  urinal?: None Help from another person bathing (including washing, rinsing, drying)?: None Help from another person to put on and taking off regular upper body clothing?: None Help from another person to put on and taking off regular lower body clothing?: None 6 Click Score: 24   End of Session    Activity Tolerance: Patient tolerated treatment well Patient left: in bed;with call bell/phone within reach  OT Visit Diagnosis: Pain                Time: 1610-9604 OT Time Calculation (min): 17 min Charges:  OT General Charges $OT Visit: 1 Visit OT Evaluation $OT Eval Low Complexity: 1 Low Berna Spare, OTR/L Acute Rehabilitation Services Office: [phone]  Evern Bio 11/18/2022, 11:04 AM

## 2022-11-23 IMAGING — CT CT HEAD W/O CM
2 series · 15 of 37 positions shown, 18 images · non-contrast
Comparison: 06/06/2020

CLINICAL DATA: Recent trauma with mouth bleeding and toxic a shin,
initial encounter



[Series 4: head wo · axial · 0.47mm/px · z∈[-100,+40]mm · 12 of 34 slices shown, 15 images]
[im 3/34  brain]
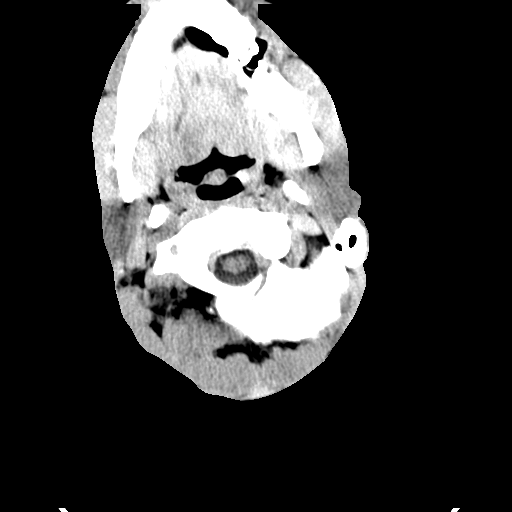
[im 3/34  bone]
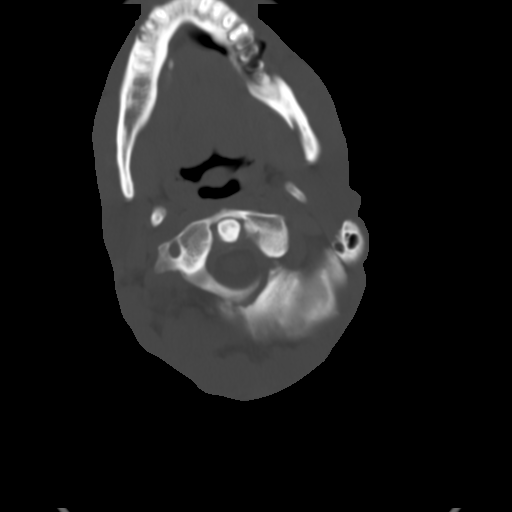
[im 5/34  brain]
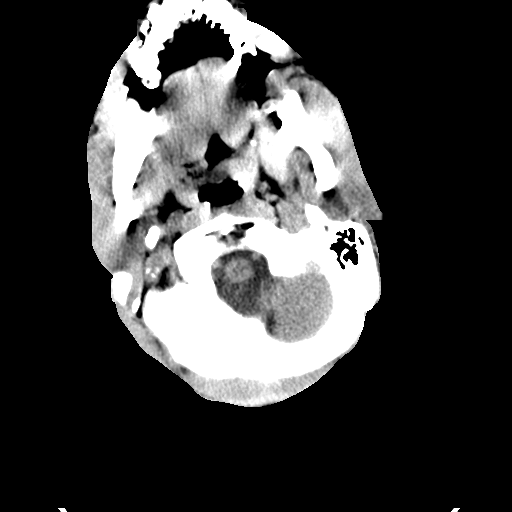
[im 7/34  brain]
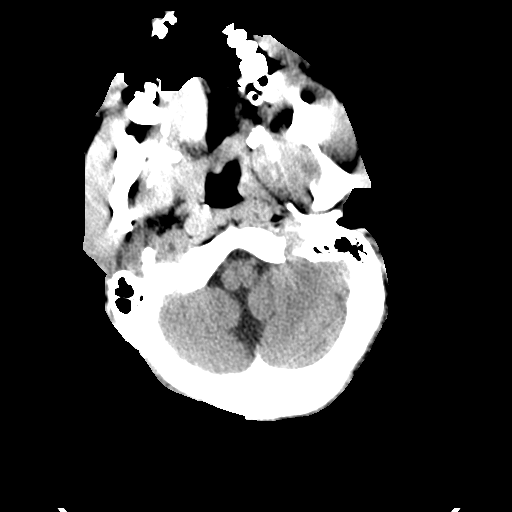
[im 11/34  brain]
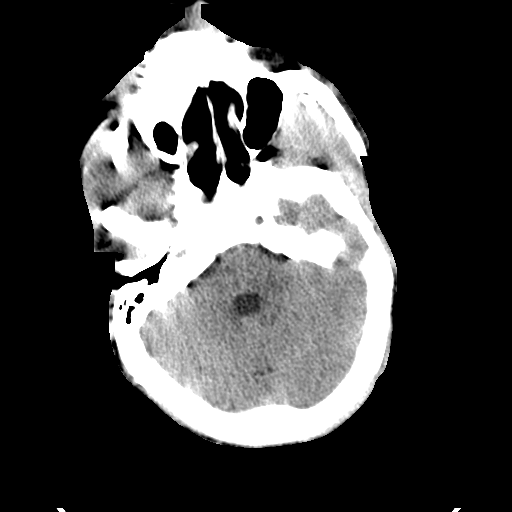
[im 13/34  brain]
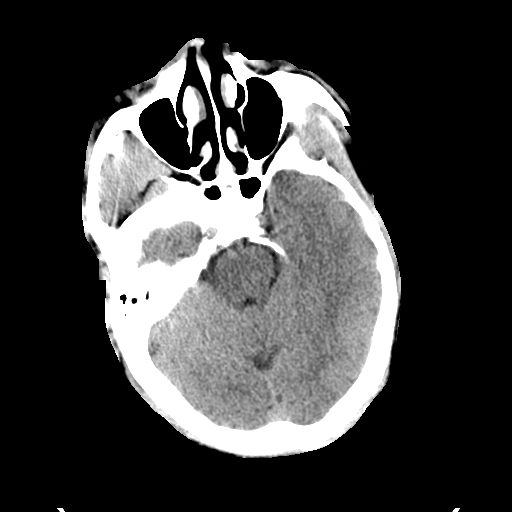
[im 13/34  bone]
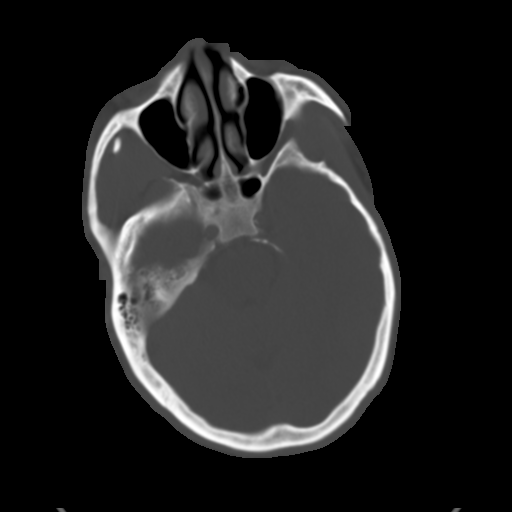
[im 15/34  brain]
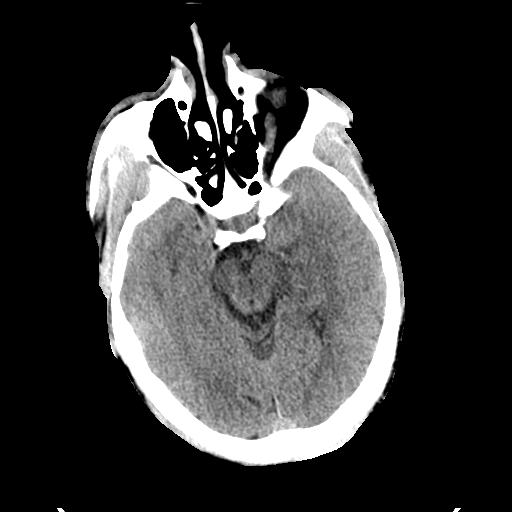
[im 19/34  brain]
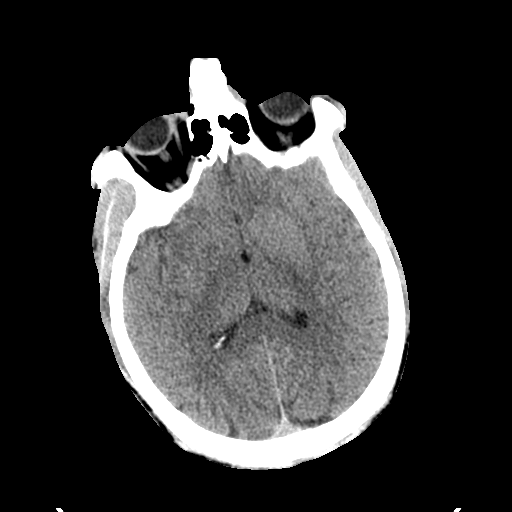
[im 21/34  brain]
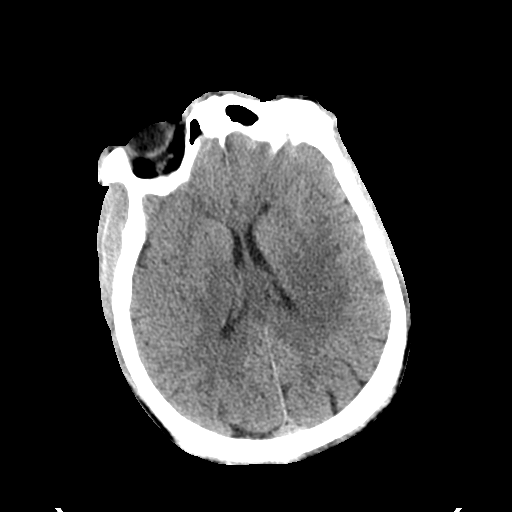
[im 23/34  brain]
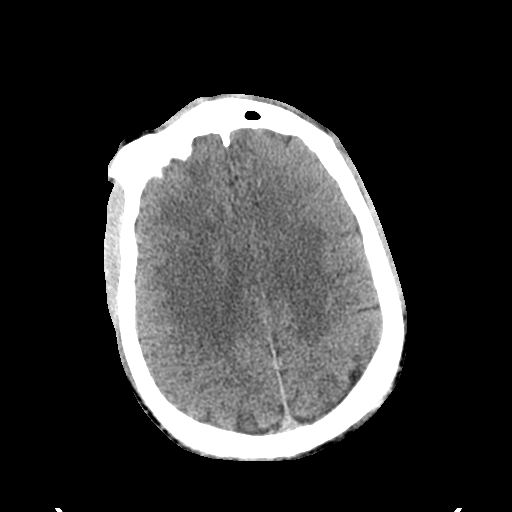
[im 23/34  bone]
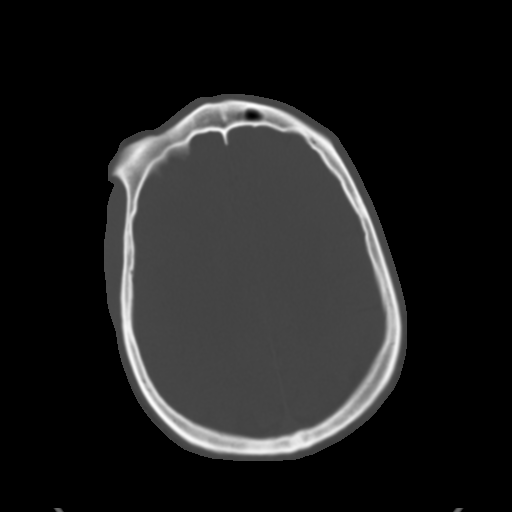
[im 27/34  brain]
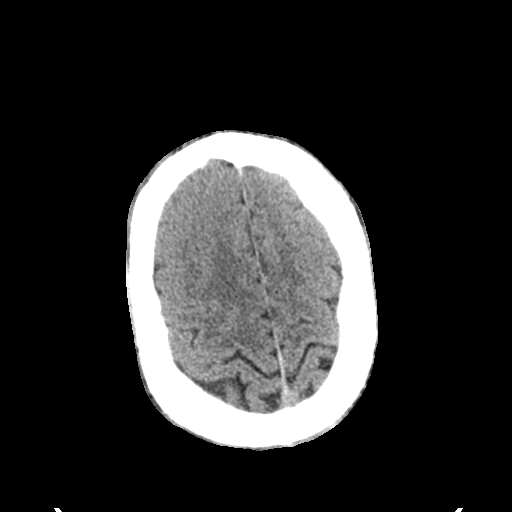
[im 29/34  brain]
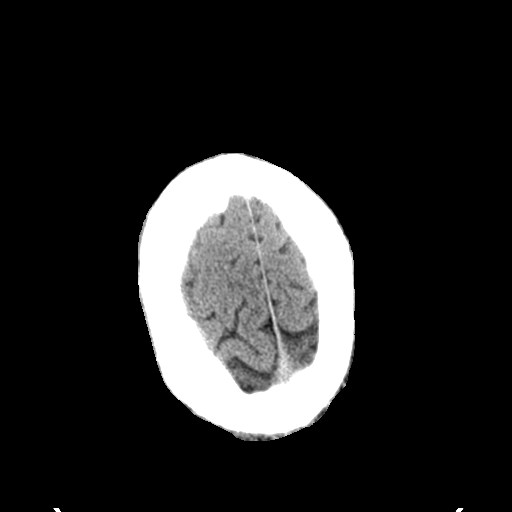
[im 31/34  brain]
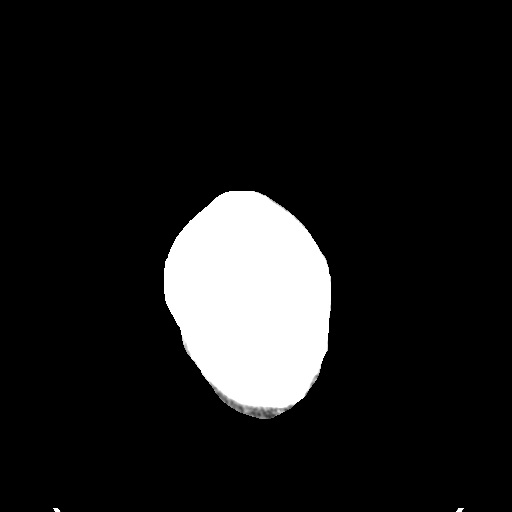

[Series 6: sagittal soft tissue · sagittal · 0.37mm/px · 3 of 55 slices shown]
[im 19/55  brain]
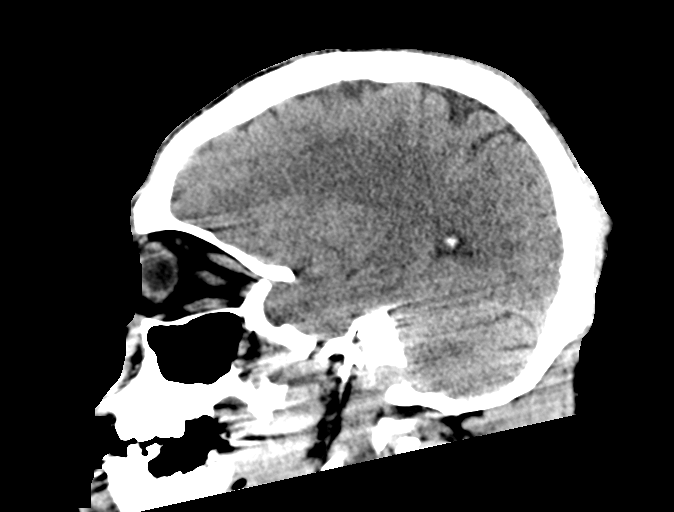
[im 28/55  brain]
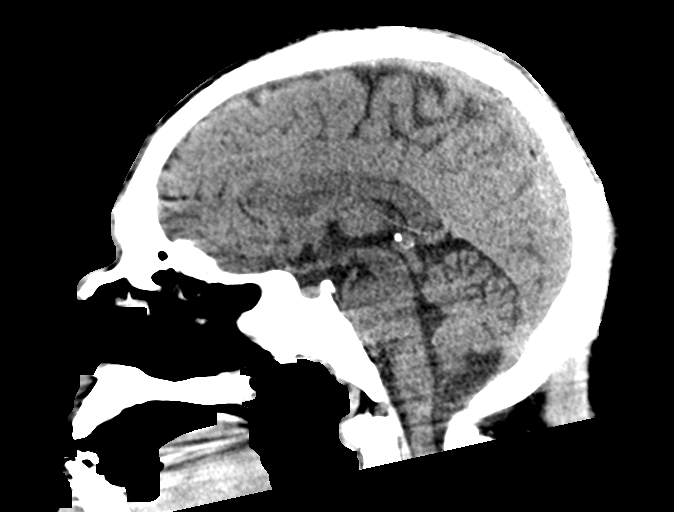
[im 37/55  brain]
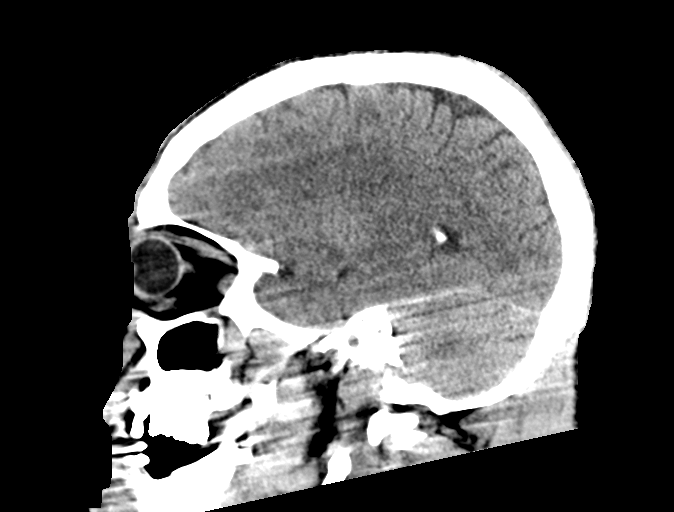

[15 of 37 positions shown; findings below may reference images not displayed]

FINDINGS: CT HEAD FINDINGS

Brain: No evidence of acute infarction, hemorrhage, hydrocephalus,
extra-axial collection or mass lesion/mass effect.

Vascular: No hyperdense vessel or unexpected calcification.

Skull: Normal. Negative for fracture or focal lesion.

Sinuses/Orbits: No acute finding.

Other: None.

CT CERVICAL SPINE FINDINGS

Alignment: Normal.

Skull base and vertebrae: 7 cervical segments are well visualized.
Vertebral body height is well maintained. Degenerative changes of
the C2-3 facet joint on the right are again identified and somewhat
progressed from the prior exam. No acute fracture or acute facet
abnormality is noted.

Soft tissues and spinal canal: Surrounding soft tissue structures
are within normal limits.

Upper chest: Visualized lung apices are unremarkable.

Other: None
IMPRESSION: CT of the head: No acute intracranial abnormality noted.

CT of the cervical spine: No acute abnormality noted. Progressive
degenerative changes at the C2-3 articular facet on the right are
seen.

## 2022-11-23 IMAGING — CT CT CERVICAL SPINE W/O CM
3 of 4 series · 13 of 35 positions shown, 16 images · non-contrast
Comparison: 06/06/2020

CLINICAL DATA: Recent trauma with mouth bleeding and toxic a shin,
initial encounter



[Series 5: orthogonal bone · axial · 0.32mm/px · z∈[-235,-111]mm · 5 of 98 slices shown, 7 images]
[im 17/98  soft-tissue]
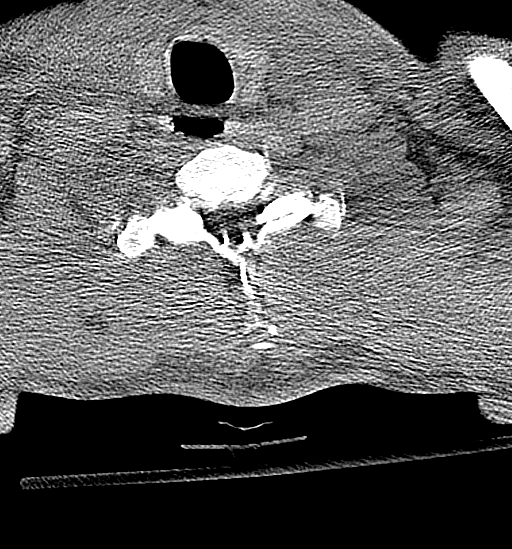
[im 17/98  bone]
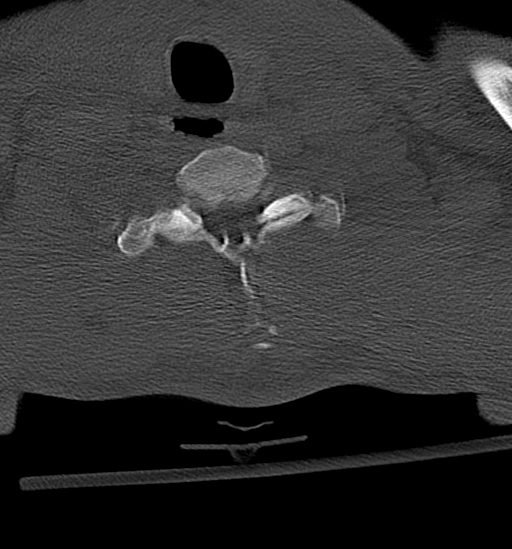
[im 33/98  bone]
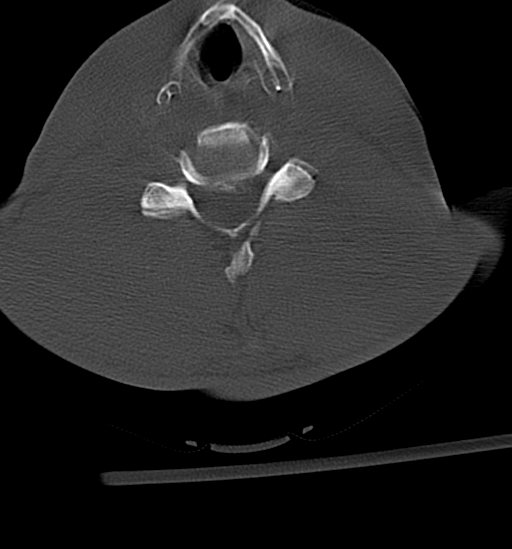
[im 49/98  bone]
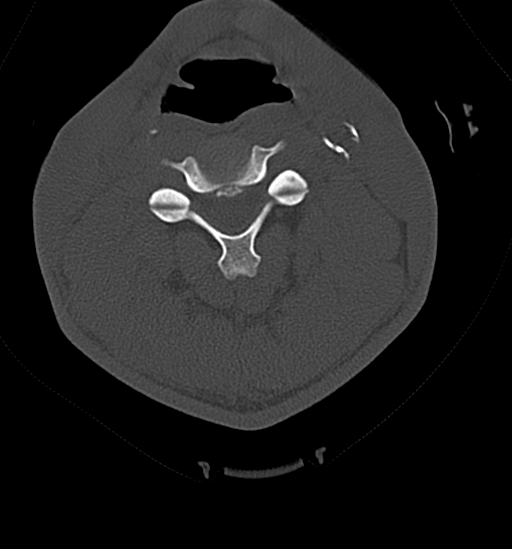
[im 65/98  bone]
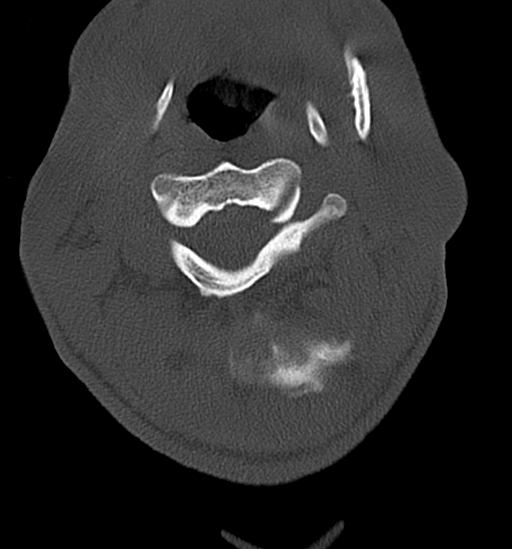
[im 81/98  soft-tissue]
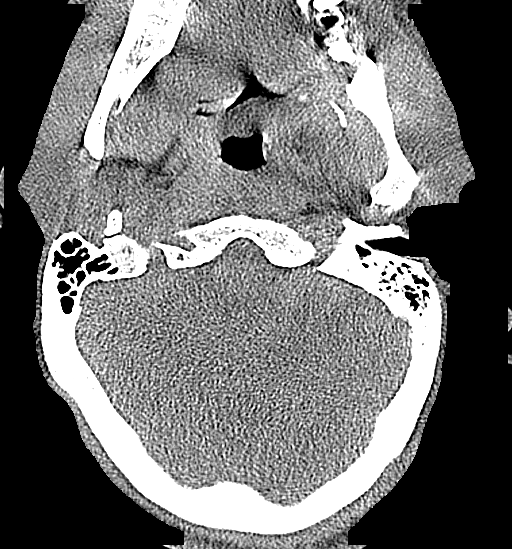
[im 81/98  bone]
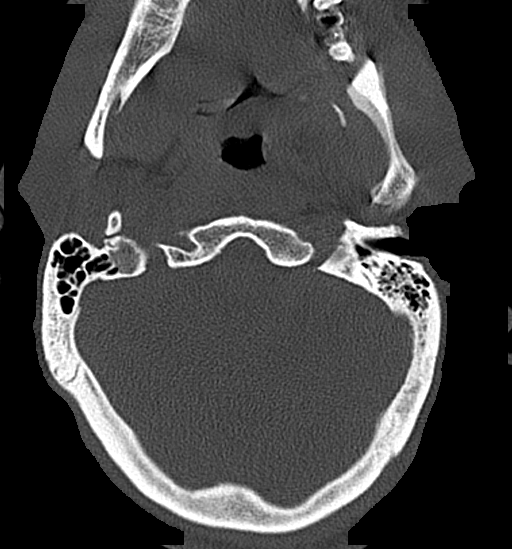

[Series 6: coronal bone · coronal · 0.33mm/px · 3 of 63 slices shown]
[im 13/63  bone]
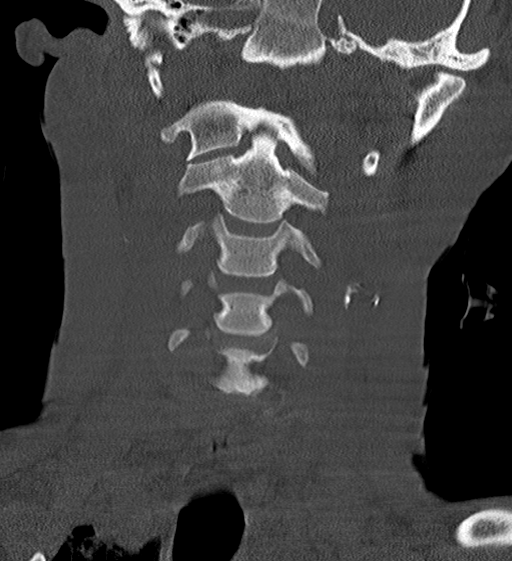
[im 25/63  bone]
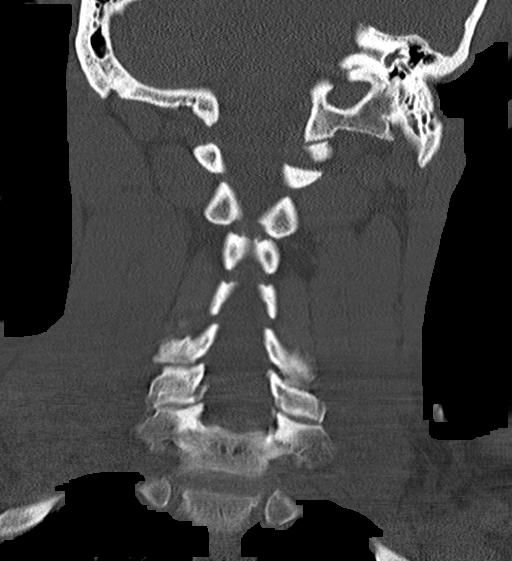
[im 38/63  bone]
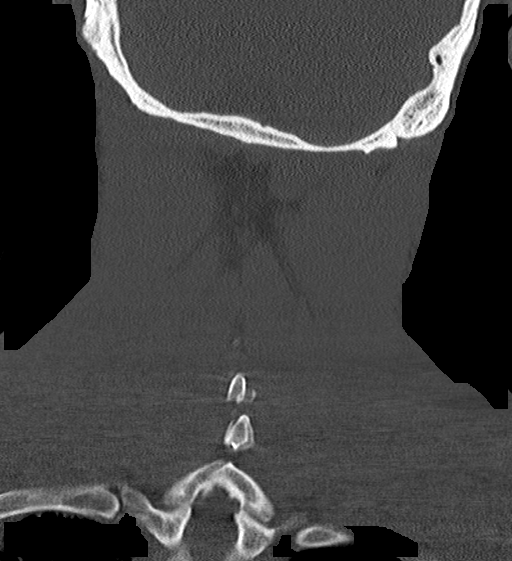

[Series 7: sagittal bone · sagittal · 0.36mm/px · 5 of 50 slices shown, 6 images]
[im 17/50  bone]
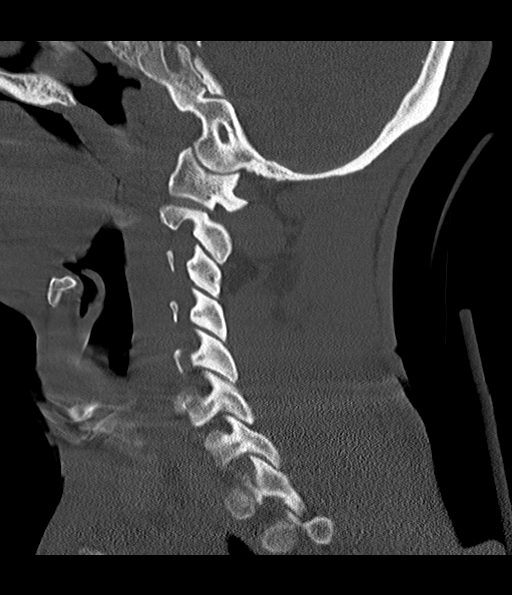
[im 21/50  bone]
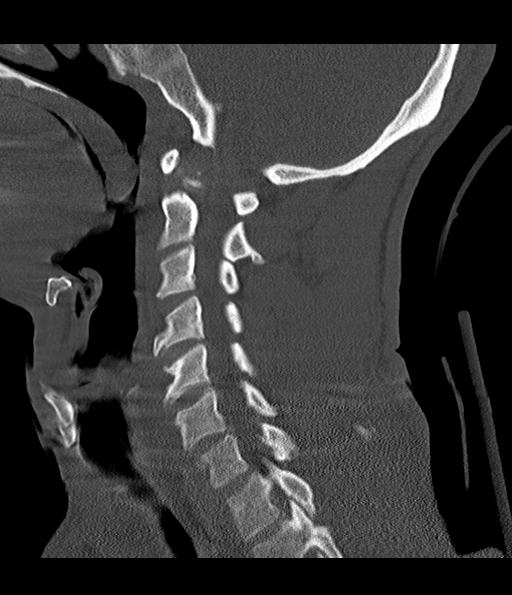
[im 25/50  soft-tissue]
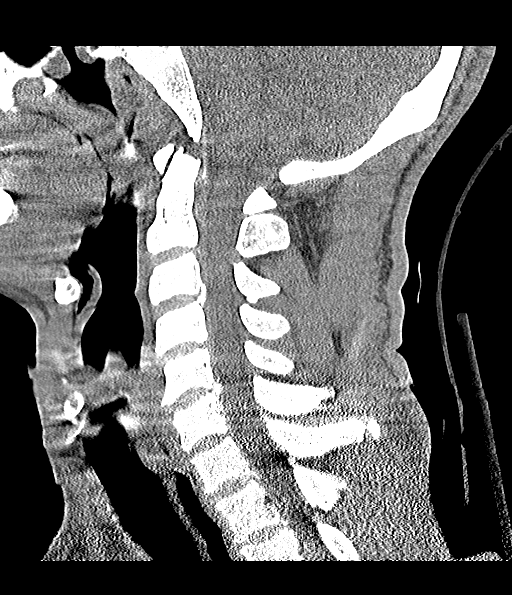
[im 25/50  bone]
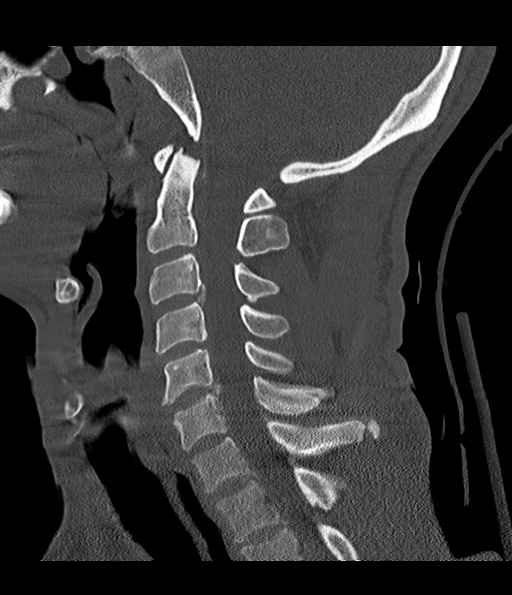
[im 29/50  bone]
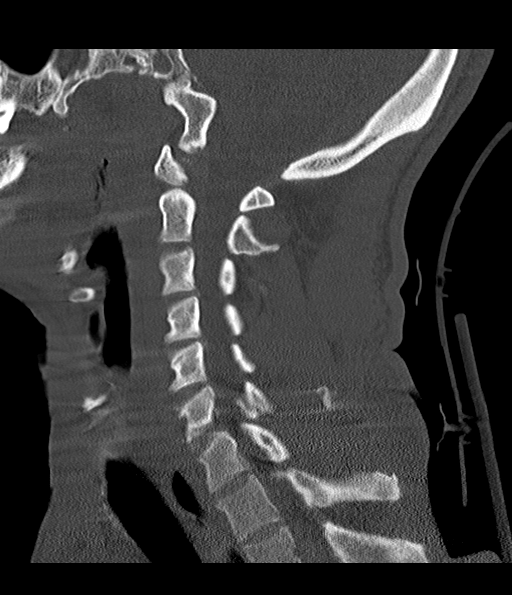
[im 33/50  bone]
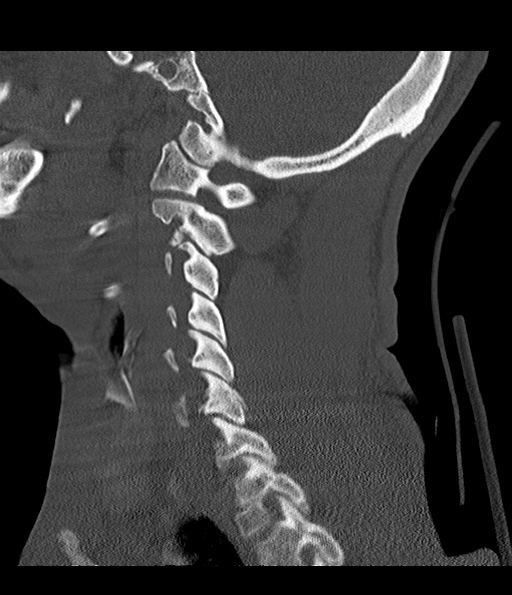

[13 of 35 positions shown; findings below may reference images not displayed]

FINDINGS: CT HEAD FINDINGS

Brain: No evidence of acute infarction, hemorrhage, hydrocephalus,
extra-axial collection or mass lesion/mass effect.

Vascular: No hyperdense vessel or unexpected calcification.

Skull: Normal. Negative for fracture or focal lesion.

Sinuses/Orbits: No acute finding.

Other: None.

CT CERVICAL SPINE FINDINGS

Alignment: Normal.

Skull base and vertebrae: 7 cervical segments are well visualized.
Vertebral body height is well maintained. Degenerative changes of
the C2-3 facet joint on the right are again identified and somewhat
progressed from the prior exam. No acute fracture or acute facet
abnormality is noted.

Soft tissues and spinal canal: Surrounding soft tissue structures
are within normal limits.

Upper chest: Visualized lung apices are unremarkable.

Other: None
IMPRESSION: CT of the head: No acute intracranial abnormality noted.

CT of the cervical spine: No acute abnormality noted. Progressive
degenerative changes at the C2-3 articular facet on the right are
seen.

## 2022-11-26 ENCOUNTER — Observation Stay (HOSPITAL_COMMUNITY)
Admission: EM | Admit: 2022-11-26 | Discharge: 2022-11-27 | Disposition: A | Discharge status: Home / Self Care | Payer: Medicare HMO | Attending: Family Medicine | Admitting: Family Medicine

## 2022-11-26 ENCOUNTER — Other Ambulatory Visit: Payer: Self-pay

## 2022-11-26 ENCOUNTER — Encounter (HOSPITAL_COMMUNITY): Payer: Self-pay | Admitting: Emergency Medicine

## 2022-11-26 ENCOUNTER — Observation Stay (HOSPITAL_COMMUNITY): Payer: Medicare HMO

## 2022-11-26 DIAGNOSIS — X58XXXA Exposure to other specified factors, initial encounter: Secondary | ICD-10-CM | POA: Diagnosis not present

## 2022-11-26 DIAGNOSIS — S61419A Laceration without foreign body of unspecified hand, initial encounter: Secondary | ICD-10-CM | POA: Diagnosis not present

## 2022-11-26 DIAGNOSIS — Z91148 Patient's other noncompliance with medication regimen for other reason: Secondary | ICD-10-CM | POA: Diagnosis not present

## 2022-11-26 DIAGNOSIS — I130 Hypertensive heart and chronic kidney disease with heart failure and stage 1 through stage 4 chronic kidney disease, or unspecified chronic kidney disease: Secondary | ICD-10-CM | POA: Insufficient documentation

## 2022-11-26 DIAGNOSIS — R0600 Dyspnea, unspecified: Secondary | ICD-10-CM | POA: Diagnosis not present

## 2022-11-26 DIAGNOSIS — J449 Chronic obstructive pulmonary disease, unspecified: Secondary | ICD-10-CM | POA: Insufficient documentation

## 2022-11-26 DIAGNOSIS — I509 Heart failure, unspecified: Secondary | ICD-10-CM | POA: Diagnosis not present

## 2022-11-26 DIAGNOSIS — B1920 Unspecified viral hepatitis C without hepatic coma: Secondary | ICD-10-CM | POA: Diagnosis present

## 2022-11-26 DIAGNOSIS — N1831 Chronic kidney disease, stage 3a: Secondary | ICD-10-CM | POA: Insufficient documentation

## 2022-11-26 DIAGNOSIS — I10 Essential (primary) hypertension: Principal | ICD-10-CM

## 2022-11-26 DIAGNOSIS — R7989 Other specified abnormal findings of blood chemistry: Secondary | ICD-10-CM | POA: Diagnosis present

## 2022-11-26 DIAGNOSIS — R531 Weakness: Secondary | ICD-10-CM | POA: Diagnosis present

## 2022-11-26 DIAGNOSIS — R778 Other specified abnormalities of plasma proteins: Secondary | ICD-10-CM | POA: Insufficient documentation

## 2022-11-26 DIAGNOSIS — Z79899 Other long term (current) drug therapy: Secondary | ICD-10-CM | POA: Insufficient documentation

## 2022-11-26 DIAGNOSIS — I517 Cardiomegaly: Secondary | ICD-10-CM | POA: Diagnosis not present

## 2022-11-26 DIAGNOSIS — I169 Hypertensive crisis, unspecified: Secondary | ICD-10-CM | POA: Insufficient documentation

## 2022-11-26 DIAGNOSIS — F1910 Other psychoactive substance abuse, uncomplicated: Secondary | ICD-10-CM | POA: Diagnosis present

## 2022-11-26 DIAGNOSIS — I160 Hypertensive urgency: Secondary | ICD-10-CM

## 2022-11-26 DIAGNOSIS — B182 Chronic viral hepatitis C: Secondary | ICD-10-CM | POA: Diagnosis present

## 2022-11-26 LAB — CBG MONITORING, ED: Glucose-Capillary: 150 mg/dL — ABNORMAL HIGH (ref 70–99)

## 2022-11-26 LAB — RAPID URINE DRUG SCREEN, HOSP PERFORMED
Amphetamines: NOT DETECTED
Barbiturates: NOT DETECTED
Benzodiazepines: NOT DETECTED
Cocaine: POSITIVE — AB
Opiates: NOT DETECTED
Tetrahydrocannabinol: NOT DETECTED

## 2022-11-26 LAB — TROPONIN I (HIGH SENSITIVITY)
Troponin I (High Sensitivity): 52 ng/L — ABNORMAL HIGH (ref <18)
Troponin I (High Sensitivity): 56 ng/L — ABNORMAL HIGH (ref <18)

## 2022-11-26 LAB — CBC
HCT: 39.5 % (ref 39.0–52.0)
Hemoglobin: 13.1 g/dL (ref 13.0–17.0)
MCH: 34.6 pg — ABNORMAL HIGH (ref 26.0–34.0)
MCHC: 33.2 g/dL (ref 30.0–36.0)
MCV: 104.2 fL — ABNORMAL HIGH (ref 80.0–100.0)
Platelets: 118 10*3/uL — ABNORMAL LOW (ref 150–400)
RBC: 3.79 MIL/uL — ABNORMAL LOW (ref 4.22–5.81)
RDW: 17 % — ABNORMAL HIGH (ref 11.5–15.5)
WBC: 3.6 10*3/uL — ABNORMAL LOW (ref 4.0–10.5)
nRBC: 0 % (ref 0.0–0.2)

## 2022-11-26 LAB — BASIC METABOLIC PANEL
Anion gap: 13 (ref 5–15)
BUN: 16 mg/dL (ref 6–20)
CO2: 22 mmol/L (ref 22–32)
Calcium: 8.7 mg/dL — ABNORMAL LOW (ref 8.9–10.3)
Chloride: 101 mmol/L (ref 98–111)
Creatinine, Ser: 1.5 mg/dL — ABNORMAL HIGH (ref 0.61–1.24)
GFR, Estimated: 56 mL/min — ABNORMAL LOW (ref >60)
Glucose, Bld: 201 mg/dL — ABNORMAL HIGH (ref 70–99)
Potassium: 3.8 mmol/L (ref 3.5–5.1)
Sodium: 136 mmol/L (ref 135–145)

## 2022-11-26 LAB — CK: Total CK: 205 U/L (ref 49–397)

## 2022-11-26 LAB — BRAIN NATRIURETIC PEPTIDE: B Natriuretic Peptide: 1709.1 pg/mL — ABNORMAL HIGH (ref 0.0–100.0)

## 2022-11-26 MED ORDER — LOSARTAN POTASSIUM 50 MG PO TABS
100.0000 mg | ORAL_TABLET | Freq: Once | ORAL | Status: AC
  Administered 2022-11-26: 100 mg via ORAL
  Filled 2022-11-26: qty 2

## 2022-11-26 MED ORDER — MUPIROCIN CALCIUM 2 % EX CREA
1.0000 | TOPICAL_CREAM | Freq: Two times a day (BID) | CUTANEOUS | Status: DC
  Administered 2022-11-27 (×2): 1 via TOPICAL
  Filled 2022-11-26: qty 15

## 2022-11-26 MED ORDER — ACETAMINOPHEN 650 MG RE SUPP: 650.0000 mg | Freq: Four times a day (QID) | RECTAL | Status: DC | PRN

## 2022-11-26 MED ORDER — SPIRONOLACTONE 12.5 MG HALF TABLET
12.5000 mg | ORAL_TABLET | Freq: Every day | ORAL | Status: DC
  Filled 2022-11-26: qty 1

## 2022-11-26 MED ORDER — UMECLIDINIUM BROMIDE 62.5 MCG/ACT IN AEPB
1.0000 | INHALATION_SPRAY | Freq: Every day | RESPIRATORY_TRACT | Status: DC
  Administered 2022-11-27: 1 via RESPIRATORY_TRACT
  Filled 2022-11-26: qty 7

## 2022-11-26 MED ORDER — FUROSEMIDE 20 MG PO TABS
40.0000 mg | ORAL_TABLET | Freq: Once | ORAL | Status: AC
  Administered 2022-11-26: 40 mg via ORAL
  Filled 2022-11-26: qty 2

## 2022-11-26 MED ORDER — ADULT MULTIVITAMIN W/MINERALS CH
1.0000 | ORAL_TABLET | Freq: Every day | ORAL | Status: DC
  Administered 2022-11-27: 1 via ORAL
  Filled 2022-11-26: qty 1

## 2022-11-26 MED ORDER — TIOTROPIUM BROMIDE MONOHYDRATE 18 MCG IN CAPS: 18.0000 ug | ORAL_CAPSULE | Freq: Every day | RESPIRATORY_TRACT | Status: DC

## 2022-11-26 MED ORDER — ACETAMINOPHEN 325 MG PO TABS
650.0000 mg | ORAL_TABLET | Freq: Once | ORAL | Status: AC
  Administered 2022-11-26: 650 mg via ORAL
  Filled 2022-11-26: qty 2

## 2022-11-26 MED ORDER — SPIRONOLACTONE 12.5 MG HALF TABLET
25.0000 mg | ORAL_TABLET | Freq: Once | ORAL | Status: AC
  Administered 2022-11-26: 25 mg via ORAL
  Filled 2022-11-26: qty 2

## 2022-11-26 MED ORDER — ACETAMINOPHEN 325 MG PO TABS: 650.0000 mg | ORAL_TABLET | Freq: Four times a day (QID) | ORAL | Status: DC | PRN

## 2022-11-26 MED ORDER — EMPAGLIFLOZIN 10 MG PO TABS
10.0000 mg | ORAL_TABLET | Freq: Every day | ORAL | Status: DC
  Administered 2022-11-27: 10 mg via ORAL
  Filled 2022-11-26: qty 1

## 2022-11-26 MED ORDER — FUROSEMIDE 40 MG PO TABS
40.0000 mg | ORAL_TABLET | Freq: Every day | ORAL | Status: DC
  Administered 2022-11-27: 40 mg via ORAL
  Filled 2022-11-26: qty 1

## 2022-11-26 MED ORDER — LOSARTAN POTASSIUM 50 MG PO TABS
100.0000 mg | ORAL_TABLET | Freq: Every day | ORAL | Status: DC
  Administered 2022-11-27: 100 mg via ORAL
  Filled 2022-11-26: qty 2

## 2022-11-26 NOTE — ED Triage Notes (Signed)
Per [facility] pt walked into fire station c/o generalized body aches and malaise. Patient states he could not wake himself up today. States he doesn't have money for food so he has not eaten today. States he was recently admitted to hospital for "heart problems." Patient is ambulatory with cane.

## 2022-11-26 NOTE — Assessment & Plan Note (Addendum)
BPs in the upper 180s/115 on admission.  Likely in the setting of medication adherence issues and recent cocaine use. Trops trended flat at 52>56.  LVH on EKG. Feels increased dyspnea on exertion but denies any endorgan symptoms of chest pain, HA, vision changes, leg swelling. No neurologic deficits on examination. - Admit to FMTS, med tele, attending Dr. Lum Babe - Vital signs per floor protocol - Regular diet  - AM CMP - Restart Losartan 100 mg daily - Consider additional antihypertensive therapy pending repeat blood pressure - VTE prophylaxis: SCDs (porcine allergy)

## 2022-11-26 NOTE — Assessment & Plan Note (Addendum)
Patient reports being compliant with his medications, however his medication bottles looked full s/p his last admission. BNP 1709 on admission. No evidence of fluid overload on examination, lungs CTAB, WOB normal on RA. Received PO Lasix 40 mg and Spironolactone 25 mg in the ED. Home meds of lasix 40 mg daily, jardiance 10 mg daily, and spironolactone (did not have this pill bottle present-unlikely to be taking this). - Restart Lasix 40 mg  - Restart Jardiance 10 mg daily - Restart Spironolactone 12.5 mg daily - Consider starting additional GDMT pending pill burden (entresto, beta blockers-although does have cocaine use and COPD) - CXR pending

## 2022-11-26 NOTE — ED Provider Triage Note (Signed)
Emergency Medicine Provider Triage Evaluation Note  Charles [name] , a 52 y.o. male  was evaluated in triage.  Pt complains of weakness shortness of breath and body pain..  Patient is currently experiencing unsheltered homelessness and polysubstance abuse including cocaine abuse.  He has a history of CHF and was hospitalized due to CHF exacerbation and medication noncompliance.  Patient states he is unable to afford his medications.  Because he has unsheltered homelessness he was out in the heat all day.  He was also unable to eat and thinks he may be weak due to that.  He complains of feeling short of breath but denies any increased swelling in his lower extremities or chest pain.  Review of Systems  Positive: Generalized weakness shortness of breath Negative: Chest pain  Physical Exam  BP (!) 187/115 (BP Location: Left Arm)   Pulse 84   Temp 98.4 F (36.9 C) (Oral)   Resp 20   Ht 5\' 6"  (1.676 m)   Wt 63.5 kg   SpO2 98%   BMI 22.60 kg/m  Gen:   Awake, no distress    Resp:  Normal effort   MSK:   Moves extremities without difficulty   Other:  No rales or JVD   Medical Decision Making  Medically screening exam initiated at 6:35 PM.  Appropriate orders placed.  Amad Mau was informed that the remainder of the evaluation will be completed by another provider, this initial triage assessment does not replace that evaluation, and the importance of remaining in the ED until their evaluation is complete.      Arthor Captain, PA-C 11/26/22 [phone]

## 2022-11-26 NOTE — H&P (Incomplete)
Hospital Admission History and Physical Service Pager: [phone]  Patient name: Charles [name] Medical record number: [id number] Date of Birth: 06/16/1970 Age: 52 y.o. Gender: male  Primary Care Provider: Renaye Rakers, MD Consultants: None Code Status: Full Code   Preferred Emergency Contact:  Contact Information     Name Relation Home Work [place] Niece [phone]  [phone]      Other Contacts     Name Relation Home Work Mobile   Audi,Dionne Sister [phone]         Chief Complaint: SOB   Assessment and Plan: Charles [name] is a 52 y.o. male presenting with SOB and weakness. Patient has been noncompliant with his medications since being discharged from the hospital about a week ago. Differential for presentation of this includes hypertensive urgency, dehydration, CHF exacerbation, ACS, and COPD exacerbation. Most likely hypertensive urgency 2/2 to medication noncompliance. He was discharged from the hospital on 11/18/22 after being treated for a CHF exacerbation secondary to medication noncompliance. PMHx includes CHF, HTN, aplastic anemia, hepatitis C, and sleep apnea not on CPAP, polysubstance abuse (cocaine, benzos, opiates, THC, fentanyl)  -      Hospital     * (Principal) Hypertensive urgency     BPs in the upper 180s/115 on admission.  Likely in the setting of  medication adherence issues and recent cocaine use. Trops trended flat at  52>56.  LVH on EKG. Feels increased dyspnea on exertion but denies any  endorgan symptoms of chest pain, HA, vision changes, leg swelling. No  neurologic deficits on examination. - Admit to FMTS, med tele, attending Dr. Lum Babe - Vital signs per floor protocol - Regular diet  - AM CMP - Restart Losartan 100 mg daily - Consider additional antihypertensive therapy pending repeat blood  pressure - VTE prophylaxis: SCDs (porcine allergy)        Polysubstance abuse (HCC)     Reports cocaine use, last  reports using 3 days ago. Also reports  smoking half a cigar/day. He has a history of overdose on Fentanyl in the  past. No IVDU. - CIWAs without ativan - Ethanol level pending - TOC consult for substance abuse        Hepatitis C     CHF (congestive heart failure) (HCC)     Patient reports being compliant with his medications, however his  medication bottles looked full s/p his last admission. BNP 1709 on  admission. No evidence of fluid overload, lungs CTAB, WOB normal on RA.  Received Lasix 40 mg and Spironolactone 25 mg in the ED.  - Restart Lasix 40 mg and Jardiance 10 mg daily - CXR pending        Laceration of hand     Laceration to R 3rd finger. Xray clear for fracture or foreign body.  Finger wrapped, dressing clean.  - Continue Augmentin x 5 days (7/6 - )        Generalized weakness     Feels weak, but is able to get up and walk with his cane. Likely due to  being outside in the heat all day without eating. No one-sided weakness on  examination, no concern for CVA at this time.  - PT/OT to treat       FEN/GI: Regular  VTE Prophylaxis: SCDs  Disposition: Med-Tele  History of Present Illness:  Charles [name] is a 52 y.o. male presenting with shortness of breath and generalized weakness. Patient reports not taking  his medications this morning, but has been taking them previously. He had his medication bottles on him and they seemed quite full. He has been outside the majority of the day in the heat and has not had much to eat. He is homeless and has no money.   Endorses SOB with sitting up, generalized weakness, some dizziness earlier, and has been having diarrhea since being discharged. Has chronic blurry vision, but no acute changes. Denies any fever, chills, cough, exposure to sick contacts, chest pain, palpitations, abdominal pain, nausea, vomiting, or numbness/tingling in his legs.  In the ED, his BP was elevated to 189/115, Trops were elevated but remain flat  upon recheck at 52>56, and lower compared to his previous admission. Patient refused his CXR due to only feeling weak due to not having eaten all day. Mild bilateral edema noted in his lower extremities, but volume status is pretty stable likely due to being dehydrated with being outside all day. Due to his elevated troponin and HTN, ED thought the admission would be worthwhile to improve his pressures.   Review Of Systems: Per HPI with the following additions: none  Pertinent Past Medical History: CHF Pulmonary Edema HTN Cocain abuse Remainder reviewed in history tab.   Pertinent Past Surgical History: None Remainder reviewed in history tab.   Pertinent Social History: Tobacco use: 1/2 cigar daily- since 52 years old 1 PPD Alcohol use: No - denies any hx of DT or withdrawal Other Substance use: Cocaine-last use 3 days ago  Living situation: patient experiencing homelessness  Pertinent Family History: HTN, HLD, Stroke, Lung cancer, Colon Cancer  Remainder reviewed in history tab.   Important Outpatient Medications: Jardiance 10 daily Augmentin Furosemide 40 daily Lost inhaler  Unsure how complaint he has been with his medications.  Remainder reviewed in medication history.   Objective: BP (!) 189/115   Pulse 79   Temp 98.4 F (36.9 C) (Oral)   Resp 16   Ht 5\' 6"  (1.676 m)   Wt 63.5 kg   SpO2 96%   BMI 22.60 kg/m   Exam: General: NAD. Awake and alert. Ill-appearing ENTM: poor dentition, missing teeth Neck: no JVD Cardiovascular: RRR. No M/R/G Respiratory: CTAB. Normal WOB on RA. No wheezing, crackles, or rhonchi noted Gastrointestinal: soft, non-tender, non-distended, normal bowel sounds MSK: no BLE edema, able to ambulate with cane Derm: warm and dry Neuro: AAOx3 Psych: alert, communicative  Labs:  CBC BMET  Recent Labs  Lab 11/26/22 1841  WBC 3.6*  HGB 13.1  HCT 39.5  PLT 118*   Recent Labs  Lab 11/26/22 1841  NA 136  K 3.8  CL 101  CO2 22   BUN 16  CREATININE 1.50*  GLUCOSE 201*  CALCIUM 8.7*     BNP: 1709.1  EKG: Regular rate. NSR with LVH. Prolonged Qtc.   Imaging Studies Performed: CXR  Cardiac enlargement.  No evidence of active pulmonary disease.   Fortunato Curling, DO 11/26/2022, 11:59 PM PGY-1, [facility]  FPTS Intern pager: [phone], text pages welcome Secure chat group [facility] Teaching Service

## 2022-11-26 NOTE — ED Provider Notes (Signed)
[place] EMERGENCY DEPARTMENT AT [facility] Provider Note   CSN: [id number] Arrival date & time: 11/26/22  1757     History  Chief Complaint  Patient presents with   Weakness    Charles [name] is a 52 y.o. male.   Weakness Patient history of CHF.  Weakness and feeling bad.  Discharge from the hospital on a week ago.  Had been noncompliant with his medications prior to the visit but states that since then he has been taking his medicines.  States has been hot outside and states he has not been eating today.  States does not have the money.  Some shortness of breath with some mild swelling his legs.  Does have history of cocaine abuse.  States he did not take his medicines this morning however.    Past Medical History:  Diagnosis Date   Aplastic anemia (HCC)    Arthritis    "left ankle" (10/17/2014)   Bilateral pneumonia 10/17/2014   Hepatitis    "think it was B" (10/17/2014)   History of blood transfusion "several"   "related to aplastic anemia"   Hypertension    Laceration of spleen    s/p embolization   Polysubstance abuse (HCC)    cocaine, benzo's, opiates, THC   Sleep apnea    "wore mask in prison; 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" (10/17/2014)    Home Medications Prior to Admission medications   Medication Sig Start Date End Date Taking? Authorizing Provider  empagliflozin (JARDIANCE) 10 MG TABS tablet Take 1 tablet (10 mg total) by mouth daily. 11/19/22   Glendale Chard, DO  furosemide (LASIX) 40 MG tablet Take 1 tablet (40 mg total) by mouth daily. 11/18/22   Glendale Chard, DO  losartan (COZAAR) 100 MG tablet Take 1 tablet (100 mg total) by mouth daily. 11/18/22 12/18/22  Glendale Chard, DO  spironolactone (ALDACTONE) 25 MG tablet Take 0.5 tablets (12.5 mg total) by mouth daily. 11/19/22 12/19/22  Glendale Chard, DO  tiotropium (SPIRIVA HANDIHALER) 18 MCG inhalation capsule Place 1 capsule (18 mcg total) into inhaler and inhale daily. Patient not taking: Reported on 11/16/2022 10/18/22  01/16/23  Maxwell Marion, PA-C      Allergies    Egg-derived products, Banana, and Pork-derived products    Review of Systems   Review of Systems  Neurological:  Positive for weakness.    Physical Exam Updated Vital Signs BP (!) 189/115   Pulse 79   Temp 98.4 F (36.9 C) (Oral)   Resp 16   Ht 5\' 6"  (1.676 m)   Wt 63.5 kg   SpO2 96%   BMI 22.60 kg/m  Physical Exam Vitals and nursing note reviewed.  Pulmonary:     Breath sounds: No wheezing or rhonchi.  Abdominal:     Tenderness: There is no abdominal tenderness.  Musculoskeletal:     Comments: Mild edema bilateral lower extremities.  Skin:    Capillary Refill: Capillary refill takes less than 2 seconds.  Neurological:     Mental Status: He is alert and oriented to person, place, and time.     ED Results / Procedures / Treatments   Labs (all labs ordered are listed, but only abnormal results are displayed) Labs Reviewed  CBC - Abnormal; Notable for the following components:      Result Value   WBC 3.6 (*)    RBC 3.79 (*)    MCV 104.2 (*)    MCH 34.6 (*)    RDW 17.0 (*)  Platelets 118 (*)    All other components within normal limits  BASIC METABOLIC PANEL - Abnormal; Notable for the following components:   Glucose, Bld 201 (*)    Creatinine, Ser 1.50 (*)    Calcium 8.7 (*)    GFR, Estimated 56 (*)    All other components within normal limits  BRAIN NATRIURETIC PEPTIDE - Abnormal; Notable for the following components:   B Natriuretic Peptide 1,709.1 (*)    All other components within normal limits  RAPID URINE DRUG SCREEN, HOSP PERFORMED - Abnormal; Notable for the following components:   Cocaine POSITIVE (*)    All other components within normal limits  CBG MONITORING, ED - Abnormal; Notable for the following components:   Glucose-Capillary 150 (*)    All other components within normal limits  TROPONIN I (HIGH SENSITIVITY) - Abnormal; Notable for the following components:   Troponin I (High Sensitivity)  52 (*)    All other components within normal limits  TROPONIN I (HIGH SENSITIVITY) - Abnormal; Notable for the following components:   Troponin I (High Sensitivity) 56 (*)    All other components within normal limits  CK    EKG None  Radiology No results found.  Procedures Procedures    Medications Ordered in ED Medications  losartan (COZAAR) tablet 100 mg (100 mg Oral Given 11/26/22 2046)  spironolactone (ALDACTONE) tablet 25 mg (25 mg Oral Given 11/26/22 2046)  furosemide (LASIX) tablet 40 mg (40 mg Oral Given 11/26/22 2046)  acetaminophen (TYLENOL) tablet 650 mg (650 mg Oral Given 11/26/22 2059)    ED Course/ Medical Decision Making/ A&P Clinical Course as of 11/26/22 2147  Tue Nov 26, 2022  2034 Weight: 63.5 kg [NP]    Clinical Course User Index [NP] Benjiman Core, MD                             Medical Decision Making Risk OTC drugs. Prescription drug management.   Social contributors to health include homelessness and cocaine use.  Complaining of feeling weak.  Recent mission the hospital and had noncompliance.  However has been outside.  States he feels as if he may be somewhat dehydrated.  Is hypertensive here.  States did not take his medicines this morning but otherwise been taking them.  Differential diagnosis includes volume overload, acute kidney injury, dehydration.  Patient refused chest x-ray saying that he was only weak because he had not been eating.  He is eating at this time.  Will get basic blood work.  I reviewed discharge note.   Patient does have mildly elevated troponin.  Continued hypertension otherwise had some noncompliance with his medication.  His volume status actually looks pretty good at this point.  With the hypertension and elevated troponin may benefit from more urgent lowering of blood pressure.  Will discuss with family medicine since they just had him on their service.        Final Clinical Impression(s) / ED  Diagnoses Final diagnoses:  Hypertension, unspecified type  Noncompliance with medication regimen    Rx / DC Orders ED Discharge Orders     None         Benjiman Core, MD 11/26/22 2147

## 2022-11-26 NOTE — H&P (Shared)
Hospital Admission History and Physical Service Pager: [phone]  Patient name: Charles [name] Medical record number: [id number] Date of Birth: Sep 14, 1970 Age: 52 y.o. Gender: male  Primary Care Provider: Renaye Rakers, MD Consultants: None Code Status: Full Code   Preferred Emergency Contact:  Contact Information     Name Relation Home Work [place] Niece [phone]  [phone]      Other Contacts     Name Relation Home Work Mobile   Blackard,Dionne Sister [phone]         Chief Complaint: SOB   Assessment and Plan: Charles [name] is a 52 y.o. male presenting with SOB and weakness. Patient has been noncompliant with his medications since being discharged from the hospital about a week ago. Differential for presentation of this includes CHF exacerbation, ACS, dehydration, COPD exacerbation, and hypertensive urgency. Most likely CHF exacerbation 2/2 to medication noncompliance and inaccessibility due to being homeless.  [facility]     Essential hypertension     Patient has hx of noncompliance with medication.  - continue home dose losartan and spironolactone         CHF exacerbation (HCC)     Output O/N 800 mL, with total output of 3250 ml. Weight 63.8 kg, down  from 65.7 kg on admission. Patient educated regarding medication  adherence, patient voiced understanding, will need medication at bedside  upon d/c to encourage compliance.  - Restart Lasix 40 mg PO, patient home dose  - Continue home jardiance  - Regular diet, fluid restriction 1500 - PT/OT to treat - AM BMP - daily weights       FEN/GI: *** VTE Prophylaxis: ***  Disposition: Med-Tele  History of Present Illness:  Charles [name] is a 52 y.o. male presenting with shortness of breath and generalized weakness. Patient reports not taking his medications this morning but has been taking them previously. He has been outside the majority of the day in the heat and has not had  much to eat. He is homeless and has no money.   States he did not take his medications this morning. States he feels very weak.  Denies any current chest pain. No racing heart. "Feels weak all over." No sided  Deneis any cough. Has been feeling shortness of breath  No headaches, some dizziness early, chronic vision blurring no acute changes, no nausea or vomiting.   Has been having diarrhea since leaving hospital?  No sick contacts  In the ED, his BP was elevated to 189/115, Trops were elevated but remain flat upon recheck at 52>56, and lower compared to his previous admission. Patient refused his CXR due to only feeling weak due to not having eaten all day. Mild bilateral edema noted in his lower extremities, but volume status is pretty stable likely due to being dehydrated with being outside all day. Due to his elevated troponin and HTN, ED thought the admission would be worthwhile to improve his pressures.   Review Of Systems: Per HPI with the following additions: ***  Pertinent Past Medical History: CHF Pulmonary Edema HTN Cocain abuse Remainder reviewed in history tab.   Pertinent Past Surgical History: None Remainder reviewed in history tab.   Pertinent Social History: Tobacco use: 1/2 cigar daily- since 52 years old 1 PPD Alcohol use: No - denies any hx of DT or withdrawal Other Substance use: Cocaine-last use 3 days ago  Living situation: patient experiencing homelessness  Pertinent Family History: ***  Remainder reviewed in history tab.   Important Outpatient Medications: Jardiance 10 daily Augmentin Furosemide 40 daily Lost inhaler    Not taking any medications at this time. Remainder reviewed in medication history.   Objective: BP (!) 189/115   Pulse 79   Temp 98.4 F (36.9 C) (Oral)   Resp 16   Ht 5\' 6"  (1.676 m)   Wt 63.5 kg   SpO2 96%   BMI 22.60 kg/m  Exam: General: *** Eyes: *** ENTM: *** Neck: *** Cardiovascular: *** Respiratory:  *** Gastrointestinal: *** MSK: *** Derm: *** Neuro: *** Psych: ***  Labs:  CBC BMET  Recent Labs  Lab 11/26/22 1841  WBC 3.6*  HGB 13.1  HCT 39.5  PLT 118*   Recent Labs  Lab 11/26/22 1841  NA 136  K 3.8  CL 101  CO2 22  BUN 16  CREATININE 1.50*  GLUCOSE 201*  CALCIUM 8.7*    Pertinent additional labs ***.  EKG: My own interpretation (not copied from electronic read) ***    Imaging Studies Performed:  Imaging Study (ie. Chest x-ray) Impression from Radiologist: ***   My Interpretation: ***   Fortunato Curling, DO 11/26/2022, 10:10 PM PGY-1, [facility]  FPTS Intern pager: [phone], text pages welcome Secure chat group [facility] Teaching Service

## 2022-11-26 NOTE — Assessment & Plan Note (Addendum)
Reports cocaine use, last reports using 3 days ago. Also reports smoking half a cigar/day. He has a history of overdose on Fentanyl in the past. No IVDU. - CIWAs without ativan - Ethanol level pending - TOC consult for substance abuse

## 2022-11-26 NOTE — Assessment & Plan Note (Addendum)
Feels weak, but is able to get up and walk with his cane. Likely due to being outside in the heat all day without eating. No one-sided weakness on examination, no concern for CVA at this time.  - PT/OT to treat

## 2022-11-26 NOTE — Hospital Course (Addendum)
SOB weak Trop elevated Homeless 189/115 BP BNPs up Vol is pretty good b/c out in heat

## 2022-11-27 ENCOUNTER — Other Ambulatory Visit (HOSPITAL_COMMUNITY): Payer: Self-pay

## 2022-11-27 DIAGNOSIS — I169 Hypertensive crisis, unspecified: Secondary | ICD-10-CM | POA: Diagnosis not present

## 2022-11-27 DIAGNOSIS — Z79899 Other long term (current) drug therapy: Secondary | ICD-10-CM | POA: Diagnosis not present

## 2022-11-27 DIAGNOSIS — Z91148 Patient's other noncompliance with medication regimen for other reason: Secondary | ICD-10-CM | POA: Diagnosis not present

## 2022-11-27 DIAGNOSIS — J449 Chronic obstructive pulmonary disease, unspecified: Secondary | ICD-10-CM | POA: Diagnosis not present

## 2022-11-27 DIAGNOSIS — S61419A Laceration without foreign body of unspecified hand, initial encounter: Secondary | ICD-10-CM | POA: Diagnosis not present

## 2022-11-27 DIAGNOSIS — I10 Essential (primary) hypertension: Secondary | ICD-10-CM

## 2022-11-27 DIAGNOSIS — I5023 Acute on chronic systolic (congestive) heart failure: Secondary | ICD-10-CM

## 2022-11-27 DIAGNOSIS — I509 Heart failure, unspecified: Secondary | ICD-10-CM | POA: Diagnosis not present

## 2022-11-27 DIAGNOSIS — N1831 Chronic kidney disease, stage 3a: Secondary | ICD-10-CM | POA: Diagnosis not present

## 2022-11-27 DIAGNOSIS — I160 Hypertensive urgency: Secondary | ICD-10-CM | POA: Diagnosis not present

## 2022-11-27 DIAGNOSIS — R778 Other specified abnormalities of plasma proteins: Secondary | ICD-10-CM | POA: Diagnosis not present

## 2022-11-27 DIAGNOSIS — I130 Hypertensive heart and chronic kidney disease with heart failure and stage 1 through stage 4 chronic kidney disease, or unspecified chronic kidney disease: Secondary | ICD-10-CM | POA: Diagnosis not present

## 2022-11-27 LAB — COMPREHENSIVE METABOLIC PANEL
ALT: 15 U/L (ref 0–44)
AST: 22 U/L (ref 15–41)
Albumin: 2.6 g/dL — ABNORMAL LOW (ref 3.5–5.0)
Alkaline Phosphatase: 60 U/L (ref 38–126)
Anion gap: 12 (ref 5–15)
BUN: 22 mg/dL — ABNORMAL HIGH (ref 6–20)
CO2: 23 mmol/L (ref 22–32)
Calcium: 8.5 mg/dL — ABNORMAL LOW (ref 8.9–10.3)
Chloride: 101 mmol/L (ref 98–111)
Creatinine, Ser: 1.46 mg/dL — ABNORMAL HIGH (ref 0.61–1.24)
GFR, Estimated: 58 mL/min — ABNORMAL LOW (ref >60)
Glucose, Bld: 113 mg/dL — ABNORMAL HIGH (ref 70–99)
Potassium: 3.8 mmol/L (ref 3.5–5.1)
Sodium: 136 mmol/L (ref 135–145)
Total Bilirubin: 0.5 mg/dL (ref 0.3–1.2)
Total Protein: 5.8 g/dL — ABNORMAL LOW (ref 6.5–8.1)

## 2022-11-27 LAB — ETHANOL: Alcohol, Ethyl (B): <10 mg/dL (ref <10)

## 2022-11-27 LAB — FOLATE: Folate: 19.9 ng/mL (ref >5.9)

## 2022-11-27 LAB — VITAMIN B12: Vitamin B-12: 223 pg/mL (ref 180–914)

## 2022-11-27 MED ORDER — PNEUMOCOCCAL 20-VAL CONJ VACC 0.5 ML IM SUSY: 0.5000 mL | PREFILLED_SYRINGE | INTRAMUSCULAR | Status: DC

## 2022-11-27 MED ORDER — MUPIROCIN 2 % EX OINT
1.0000 | TOPICAL_OINTMENT | Freq: Two times a day (BID) | CUTANEOUS | 0 refills | Status: DC
  Filled 2022-11-27: qty 22, 11d supply, fill #0

## 2022-11-27 MED ORDER — HYDRALAZINE HCL 20 MG/ML IJ SOLN
10.0000 mg | Freq: Once | INTRAMUSCULAR | Status: AC
  Administered 2022-11-27: 10 mg via INTRAVENOUS
  Filled 2022-11-27: qty 1

## 2022-11-27 MED ORDER — AMLODIPINE BESYLATE 10 MG PO TABS
10.0000 mg | ORAL_TABLET | Freq: Every day | ORAL | Status: DC
  Administered 2022-11-27: 10 mg via ORAL
  Filled 2022-11-27 (×2): qty 1

## 2022-11-27 MED ORDER — AMLODIPINE BESYLATE 10 MG PO TABS: 10.0000 mg | ORAL_TABLET | Freq: Every day | ORAL | Status: DC

## 2022-11-27 MED ORDER — SPIRONOLACTONE 25 MG PO TABS
25.0000 mg | ORAL_TABLET | Freq: Every day | ORAL | Status: DC
  Administered 2022-11-27: 25 mg via ORAL
  Filled 2022-11-27: qty 1

## 2022-11-27 MED ORDER — CLONIDINE HCL 0.1 MG PO TABS
0.1000 mg | ORAL_TABLET | Freq: Two times a day (BID) | ORAL | Status: DC
  Administered 2022-11-27: 0.1 mg via ORAL
  Filled 2022-11-27: qty 1

## 2022-11-27 MED ORDER — SPIRONOLACTONE 25 MG PO TABS
25.0000 mg | ORAL_TABLET | Freq: Every day | ORAL | 0 refills | Status: DC
  Filled 2022-11-27: qty 30, 30d supply, fill #0

## 2022-11-27 NOTE — Assessment & Plan Note (Addendum)
Reports cocaine use, last reports using 3 days ago although UDS positive today. Also reports smoking half a cigar/day and has been smoking since 53 years old. Denies any current alcohol use-last use was months ago per patient. Denies any hx of DTs or withdrawal. Mentating well. He has a history of overdose on Fentanyl in the past. No IVDU hx per patient.  - CIWAs without ativan - Ethanol level negative - TOC consult for substance abuse

## 2022-11-27 NOTE — Assessment & Plan Note (Signed)
Patient reports being compliant with his medications, however his medication bottles looked full s/p his last admission.  No overt evidence of fluid overload on examination, lungs CTAB, WOB normal on RA. Home meds of lasix 40 mg daily, jardiance 10 mg daily, and spironolactone (did not have this pill bottle present-unlikely to be taking this). -Continue PO Lasix 40 mg  -Continue Jardiance 10 mg daily -Continue spironolactone 25 mg daily - Consider starting additional GDMT pending pill burden (entresto, beta blockers-although does have cocaine use and COPD)

## 2022-11-27 NOTE — Assessment & Plan Note (Addendum)
BPs in the upper 180s/115 on admission.  Likely in the setting of medication adherence issues and recent cocaine use. Denies any end organ symptoms of chest pain, HA, vision changes, leg swelling. No neurologic deficits on examination. S/p losartan 100 mg, lasix 40 mg, spironolactone 25 mg in ED. - AM CMP - Continue Losartan 100 mg daily, Lasix 40 mg daily, Spironolactone 25mg  daily - Clonidine 0.1 BID while inpatient. Will not continue outpatient due to concern for noncompliance and rebound HTN. - VTE prophylaxis: SCDs (porcine allergy)

## 2022-11-27 NOTE — Care Management Obs Status (Cosign Needed)
MEDICARE OBSERVATION STATUS NOTIFICATION   Patient Details  Name: Charles [name] MRN: [id number] Date of Birth: 08-21-1970   Medicare Observation Status Notification Given:  Yes    Janae Bridgeman, RN 11/27/2022, 3:27 PM

## 2022-11-27 NOTE — Assessment & Plan Note (Signed)
Chronic. Last viral load was 6.97 million on 07/2022. Has not had ID follow-up, although this was recommended in the past. No current IVDU and denies any history.  - Recommend outpatient ID follow up - CMP

## 2022-11-27 NOTE — Progress Notes (Signed)
PT Cancellation Note  Patient Details Name: Charles [name] MRN: [id number] DOB: 01/25/1971   Cancelled Treatment:    Reason Eval/Treat Not Completed: Patient declined, no reason specified  PT orders in as imminent d/c.  Pt declined therapy due to wanting to sleep and has a headache.  Tried to encourage on importance of mobility and therapy eval to assess d/c needs , but he continued to decline and mildly agitated stating "I can't get no rest anywhere, they kept me up all night, I'm not walking, I have a headache and can't sleep."  Will f/u as able.   Anise Salvo, PT Acute Rehab [facility] [phone]   Rayetta Humphrey 11/27/2022, 1:03 PM

## 2022-11-27 NOTE — Assessment & Plan Note (Signed)
BPs in the upper 180s/115 on admission.  Likely in the setting of medication adherence issues and recent cocaine use. Trops trended flat at 52>56.  LVH on EKG. Feels increased dyspnea on exertion but denies any end organ symptoms of chest pain, HA, vision changes, leg swelling. No neurologic deficits on examination. S/p losartan 100 mg, lasix 40 mg, spironolactone 25 mg in ED. - Admit to FMTS, med tele, attending Dr. Lum Babe - Vital signs per floor protocol - Regular diet  - AM CMP - Restart Losartan 100 mg daily, Lasix 40 mg daily, Spironolactone 12.5 daily - Consider additional antihypertensive therapy pending repeat blood pressure - VTE prophylaxis: SCDs (porcine allergy)

## 2022-11-27 NOTE — Assessment & Plan Note (Addendum)
Feels weak, but is able to get up easily and walk with his cane. Initial CBG 150. Likely due to being outside in the heat all day without eating/drinking. No focal neurological deficits, less concern for CVA or hypertensive emergency at this time.  - PT eval - Regular diet - MVI - CMP

## 2022-11-27 NOTE — Assessment & Plan Note (Addendum)
Chronic. Last viral load was 6.97 million on 07/2022. Has not had ID follow-up, although this was recommended in the past. No current IVDU and denies any history.  - Recommend outpatient ID follow up
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
## 2022-11-27 NOTE — Progress Notes (Signed)
Heart Failure Navigator Progress Note  Assessed for Heart & Vascular TOC clinic readiness.  Patient does not meet criteria due to [facility] patient.   Navigator will sign off at this time.    Dawn Fields, BSN, RN Heart Failure Nurse Navigator Secure Chat Only   

## 2022-11-27 NOTE — TOC Transition Note (Signed)
Transition of Care [facility]) - CM/SW Discharge Note   Patient Details  Name: Charles [name] MRN: [id number] Date of Birth: 07-05-1970  Transition of Care [facility]) CM/SW Contact:  Janae Bridgeman, RN Phone Number: 11/27/2022, 3:35 PM   Clinical Narrative:    Patient met with the patient prior to discharge.  The patient admitted for Hypertension, recent use of cocaine and medication noncompliance.  The patient actively uses cocaine - patient was agreeable to OP Counseling - provided at the bedside - patient given updated AVS to include resources  - including OP counseling for substance abuse, smoking and shelter resources.  Patient was provided with Medicare observation notice at the bedside as well.  The patient's niece plans to provide transportation home today.   Final next level of care: Home/Self Care Barriers to Discharge: No Barriers Identified   Patient Goals and CMS Choice CMS Medicare.gov Compare Post Acute Care list provided to:: Patient Choice offered to / list presented to : Patient  Discharge Placement                         Discharge Plan and Services Additional resources added to the After Visit Summary for                                       Social Determinants of Health (SDOH) Interventions SDOH Screenings   Food Insecurity: Food Insecurity Present (11/27/2022)  Housing: High Risk (11/27/2022)  Transportation Needs: Unmet Transportation Needs (11/27/2022)  Utilities: At Risk (11/27/2022)  Social Connections: Unknown (09/24/2021)   Received from [facility]  Tobacco Use: High Risk (11/26/2022)     Readmission Risk Interventions    10/04/2022   11:27 AM 08/12/2022    4:45 PM 07/23/2022    4:29 PM  Readmission Risk Prevention Plan  Transportation Screening Complete Complete Complete  Medication Review Ocean[facility]) Complete Complete Complete  PCP or Specialist appointment within 3-5 days of discharge Complete Complete Complete   HRI or Home Care Consult Complete Complete Complete  SW Recovery Care/Counseling Consult Complete Complete Complete  Palliative Care Screening Not Applicable Not Applicable Not Applicable  Skilled Nursing Facility Not Applicable Not Applicable Not Applicable

## 2022-11-27 NOTE — Discharge Summary (Addendum)
Family Medicine [facility] Discharge Summary  Patient name: Charles [name] Medical record number: [id number] Date of birth: 10-26-1970 Age: 52 y.o. Gender: male Date of Admission: 11/26/2022  Date of Discharge: 11/27/2022 Admitting Physician: Fortunato Curling, DO  Primary Care Provider: Renaye Rakers, MD Consultants: None  Indication for Hospitalization: Elevated blood pressure in the setting of medication noncompliance  Brief Hospital Course:  Charles [name] is a 52 y.o.male with a history of CHF, HTN, aplastic anemia, hepatitis C, sleep apnea not on CPAP, polysubstance abuse who was admitted to the [facility] Teaching Service at [facility] for SOB and generalized weakness. His hospital course is detailed below:   Generalized Weakness Patient complained of generalized weakness upon admission, but is able to easily get up and walk with his cane. PT consulted and patient declined. He was at his baseline by hospital day 2. Symptoms felt to be related to being outdoors in the heat coupled with substance abuse.   Elevated BP Presented with BPs in the upper 180s/115 on admission. Likely in the setting of medication adherence issues and recent cocaine use. Restarted home Losartan 100 mg, Lasix 40 mg. Increased spironolactone to 25mg .   CHF Recently discharged due to a CHF exacerbation his BNP was 1709 admission versus in the 2000s in his previous admission. Dry weight of 140 which he remains up today.  No overt evidence of fluid overload on exam.  Patient was restarted on his home Lasix 40 mg, spironolactone 25 mg, and Jardiance 10 mg.   Elevated Troponin Trop elevated to 52 but trended flat for 56 likely in the setting of recent cocaine use. Denies any chest pain at this time.  Polysubstance Abuse Reports cocaine use, last reports using 3 days ago although UDS positive today. Also reports smoking half a cigar/day and has been smoking since 52 years old. Consulted TOC for  substance abuse counseling. He received some PO clonidine in the hospital for management of withdrawal symptoms.   Other chronic conditions were medically managed with home medications and formulary alternatives as necessary:  COPD - restarted home Spiriva inhaler  PCP Follow-up Recommendations:  Patient is not on a B-blocker for his HFrEF, as best we can tell this is being held due to his ongoing cocaine abuse. If he can get plugged in with substance abuse resources, could consider B blocker addition for full GDMT.   Discharge Diagnoses/Problem List:  Active Problems:   Elevated troponin   CHF (congestive heart failure) (HCC)   Polysubstance abuse (HCC)   Generalized weakness   Laceration of hand   Chronic hepatitis C (HCC)   Elevated blood pressure reading with diagnosis of hypertension  Disposition: Home  Discharge Condition: Stable  Discharge Exam:  General: No acute distress Cardiovascular: RRR, no murmurs Respiratory: CTA bilaterally.  No increased work of breathing, on room air Abdomen: Soft, nontender Extremities: Moves all extremities equally Neuro: Alert and oriented Psych: Communicative and cooperative   Significant Labs and Imaging:  Recent Labs  Lab 11/26/22 1841  WBC 3.6*  HGB 13.1  HCT 39.5  PLT 118*   Recent Labs  Lab 11/26/22 1841 11/27/22 0214  NA 136 136  K 3.8 3.8  CL 101 101  CO2 22 23  GLUCOSE 201* 113*  BUN 16 22*  CREATININE 1.50* 1.46*  CALCIUM 8.7* 8.5*  ALKPHOS  --  60  AST  --  22  ALT  --  15  ALBUMIN  --  2.6*  Discharge Medications:  Allergies as of 11/27/2022       Reactions   Egg-derived Products Nausea And Vomiting   Banana Nausea And Vomiting   Pork-derived Products Nausea And Vomiting        Medication List     STOP taking these medications    tiotropium 18 MCG inhalation capsule Commonly known as: Spiriva HandiHaler       TAKE these medications    furosemide 40 MG tablet Commonly known as:  LASIX Take 1 tablet (40 mg total) by mouth daily.   Jardiance 10 MG Tabs tablet Generic drug: empagliflozin Take 1 tablet (10 mg total) by mouth daily.   losartan 100 MG tablet Commonly known as: COZAAR Take 1 tablet (100 mg total) by mouth daily.   mupirocin ointment 2 % Commonly known as: BACTROBAN Apply 1 Application topically 2 (two) times daily.   spironolactone 25 MG tablet Commonly known as: ALDACTONE Take 1 tablet (25 mg total) by mouth daily. Start taking on: November 28, 2022 What changed: how much to take        Discharge Instructions: Please refer to Patient Instructions section of EMR for full details.  Patient was counseled important signs and symptoms that should prompt return to medical care, changes in medications, dietary instructions, activity restrictions, and follow up appointments.   Cyndia Skeeters, DO 11/27/2022, 1:06 PM PGY-1, [facility]    I have evaluated this patient along with Dr. Mliss Sax and reviewed the above note, making necessary revisions.  Dorothyann Gibbs, MD 11/27/2022, 1:06 PM PGY-3, [facility] Family Medicine

## 2022-11-27 NOTE — Assessment & Plan Note (Addendum)
Trop elevated to 52 but trended flat to 56.  Most likely in the setting of recent cocaine use and hypertension. No acute ischemic changes on EKG. Denies chest pain. -Cardiac monitoring -Low threshold for repeat EKG/trops if patient develops chest pain or vital sign instability

## 2022-11-27 NOTE — Progress Notes (Signed)
Daily Progress Note Intern Pager: [phone]  Patient name: Charles [name] Medical record number: [id number] Date of birth: 12-Dec-1970 Age: 52 y.o. Gender: male  Primary Care Provider: Renaye Rakers, MD Consultants: None Code Status: Full  Pt Overview and Major Events to Date:  Admitted to [facility] 7/16  Assessment and Plan: Charles [name] is a 52 y.o. male presenting with shortness of breath and generalized weakness.  Patient has improved and is appropriate for discharge.   [facility]     Polysubstance abuse [facility])     Reports cocaine use, last reports using 3 days ago although UDS  positive today. Also reports smoking half a cigar/day and has been smoking  since 52 years old. Denies any current alcohol use-last use was months ago  per patient. Denies any hx of DTs or withdrawal. Mentating well. He has a  history of overdose on Fentanyl in the past. No IVDU hx per patient.  - CIWAs without ativan - Ethanol level negative - TOC consult for substance abuse        Chronic hepatitis C (HCC)     Chronic. Last viral load was 6.97 million on 07/2022. Has not had ID  follow-up, although this was recommended in the past. No current IVDU and  denies any history.  - Recommend outpatient ID follow up - CMP        Elevated troponin     Trop elevated to 52 but trended flat to 56.  Most likely in the setting  of recent cocaine use and hypertension. No acute ischemic changes on EKG.  Denies chest pain. -Cardiac monitoring -Low threshold for repeat EKG/trops if patient develops chest pain or  vital sign instability        CHF (congestive heart failure) (HCC)     Patient reports being compliant with his medications, however his  medication bottles looked full s/p his last admission.  No overt evidence  of fluid overload on examination, lungs CTAB, WOB normal on RA. Home meds  of lasix 40 mg daily, jardiance 10 mg daily, and spironolactone (did not  have this pill bottle  present-unlikely to be taking this). -Continue PO Lasix 40 mg  -Continue Jardiance 10 mg daily -Continue spironolactone 25 mg daily - Consider starting additional GDMT pending pill burden (entresto, beta  blockers-although does have cocaine use and COPD)        Laceration of hand     Patient was discharged on Augmentin 5 day course which was supposed to  be completed on 7/12.  It is unclear that he completed this course of  medication.  He had just had his laceration rebandaged when he was seen  this morning and did not want [place] to remove the bandage. - Mupirocin ointment BID x 5 days (7/17-7/22) - Monitor for signs of infection        Generalized weakness     Feels weak, but is able to get up easily and walk with his cane.  Initial CBG 150. Likely due to being outside in the heat all day without  eating/drinking. No focal neurological deficits, less concern for CVA or  hypertensive emergency at this time.  - PT eval - Regular diet - MVI - CMP        Elevated blood pressure reading with diagnosis of hypertension     BPs in the upper 180s/115 on admission.  Likely in the setting of  medication adherence issues  and recent cocaine use. Denies any end organ  symptoms of chest pain, HA, vision changes, leg swelling. No neurologic  deficits on examination. S/p losartan 100 mg, lasix 40 mg, spironolactone  25 mg in ED. - AM CMP - Continue Losartan 100 mg daily, Lasix 40 mg daily, Spironolactone 25mg   daily - Clonidine 0.1 BID while inpatient. Will not continue outpatient due to  concern for noncompliance and rebound HTN. - VTE prophylaxis: SCDs (porcine allergy)       FEN/GI: Regular diet PPx: SCDs, ambulation Dispo:Home today.   Subjective:  Patient seen lying in bed this morning.  He denies any pain or concerns overnight.  He states he is hungry.  Otherwise without complaint.  Objective: Temp:  [97.7 F (36.5 C)-98.4 F (36.9 C)] 97.7 F (36.5 C) (07/17 0819) Pulse  Rate:  [68-102] 81 (07/17 0819) Resp:  [16-20] 18 (07/17 0819) BP: (160-192)/(101-122) 169/122 (07/17 0819) SpO2:  [96 %-100 %] 98 % (07/17 0819) Weight:  [63.5 kg] 63.5 kg (07/16 1802) Physical Exam: General: No acute distress Cardiovascular: RRR, no murmurs Respiratory: CTA bilaterally.  No increased work of breathing, on room air Abdomen: Soft, nontender Extremities: Moves all extremities equally Neuro: Alert and oriented Psych: Communicative and cooperative  Laboratory: Most recent CBC Lab Results  Component Value Date   WBC 3.6 (L) 11/26/2022   HGB 13.1 11/26/2022   HCT 39.5 11/26/2022   MCV 104.2 (H) 11/26/2022   PLT 118 (L) 11/26/2022   Most recent BMP    Latest Ref Rng & Units 11/27/2022    2:14 AM  BMP  Glucose 70 - 99 mg/dL 409   BUN 6 - 20 mg/dL 22   Creatinine 8.11 - 1.24 mg/dL 9.14   Sodium 782 - 956 mmol/L 136   Potassium 3.5 - 5.1 mmol/L 3.8   Chloride 98 - 111 mmol/L 101   CO2 22 - 32 mmol/L 23   Calcium 8.9 - 10.3 mg/dL 8.5     Cyndia Skeeters, DO 11/27/2022, 11:45 AM  PGY-1, [facility] FPTS Intern pager: [phone], text pages welcome Secure chat group [facility] Teaching Service

## 2022-11-27 NOTE — Assessment & Plan Note (Signed)
Patient was discharged on Augmentin 5 day course which was supposed to be completed on 7/12. Seems that the patient still has a full pill bottle. His laceration looks dry and crusted over. Denies fevers, chills, or other signs of infection at the site. - Mupirocin ointment BID x 5 days (7/17-7/22) - Monitor for signs of infection

## 2022-11-27 NOTE — Assessment & Plan Note (Addendum)
Patient reports being compliant with his medications, however his medication bottles looked full s/p his last admission.  No overt evidence of fluid overload on examination, lungs CTAB, WOB normal on RA. Home meds of lasix 40 mg daily, jardiance 10 mg daily, and spironolactone (did not have this pill bottle present-unlikely to be taking this). -Continue PO Lasix 40 mg  -Continue Jardiance 10 mg daily -Continue spironolactone 25 mg daily - Consider starting additional GDMT pending pill burden (entresto, beta blockers-although does have cocaine use and COPD)

## 2022-11-27 NOTE — Assessment & Plan Note (Addendum)
Patient was discharged on Augmentin 5 day course which was supposed to be completed on 7/12.  It is unclear that he completed this course of medication.  He had just had his laceration rebandaged when he was seen this morning and did not want [place] to remove the bandage. - Mupirocin ointment BID x 5 days (7/17-7/22) - Monitor for signs of infection

## 2022-11-27 NOTE — Discharge Instructions (Addendum)
Dear Marinda Elk,   Thank you for letting us participate in your care! In this section, you will find a brief hospital admission summary of why you were admitted to the hospital, what happened during your admission, your diagnosis/diagnoses, and recommended follow up.  Primary diagnosis: Elevated Blood Pressure Treatment plan: Please take your medications as prescribed - Spironolactone 25mg  daily, Losartan 100mg  daily Other medications you should take - Lasix 40mg  daily, Jardiance 10mg  daily    POST-HOSPITAL & CARE INSTRUCTIONS We recommend following up with your PCP within 1 week from being discharged from the hospital. Please let PCP/Specialists know of any changes in medications that were made which you will be able to see in the medications section of this packet.  DOCTOR'S APPOINTMENTS & FOLLOW UP No future appointments.   Thank you for choosing [facility]! Take care and be well!  Family Medicine Teaching Service Inpatient Team [place]  [facility]  [address] [place], [place] [address] [phone]

## 2022-12-02 ENCOUNTER — Encounter (HOSPITAL_COMMUNITY): Payer: Self-pay

## 2022-12-02 ENCOUNTER — Emergency Department (HOSPITAL_COMMUNITY)
Admission: EM | Admit: 2022-12-02 | Discharge: 2022-12-03 | Disposition: A | Discharge status: Home / Self Care | Payer: Medicare HMO | Attending: Emergency Medicine | Admitting: Emergency Medicine

## 2022-12-02 ENCOUNTER — Other Ambulatory Visit: Payer: Self-pay

## 2022-12-02 ENCOUNTER — Emergency Department (HOSPITAL_COMMUNITY): Payer: Medicare HMO

## 2022-12-02 DIAGNOSIS — K469 Unspecified abdominal hernia without obstruction or gangrene: Secondary | ICD-10-CM | POA: Diagnosis not present

## 2022-12-02 DIAGNOSIS — R944 Abnormal results of kidney function studies: Secondary | ICD-10-CM | POA: Diagnosis not present

## 2022-12-02 DIAGNOSIS — M25551 Pain in right hip: Secondary | ICD-10-CM | POA: Diagnosis not present

## 2022-12-02 DIAGNOSIS — M1611 Unilateral primary osteoarthritis, right hip: Secondary | ICD-10-CM | POA: Insufficient documentation

## 2022-12-02 NOTE — ED Triage Notes (Signed)
Pt reports right hip pain, hx of arthritis in his hip. Pt recently discharged from hospital. Denies any recent injury or fall

## 2022-12-03 ENCOUNTER — Other Ambulatory Visit: Payer: Self-pay

## 2022-12-03 ENCOUNTER — Encounter (HOSPITAL_COMMUNITY): Payer: Self-pay

## 2022-12-03 ENCOUNTER — Emergency Department (HOSPITAL_COMMUNITY)
Admission: EM | Admit: 2022-12-03 | Discharge: 2022-12-03 | Disposition: A | Discharge status: Home / Self Care | Payer: Medicare HMO | Source: Home / Self Care | Attending: Emergency Medicine | Admitting: Emergency Medicine

## 2022-12-03 DIAGNOSIS — M1611 Unilateral primary osteoarthritis, right hip: Secondary | ICD-10-CM | POA: Diagnosis not present

## 2022-12-03 DIAGNOSIS — K469 Unspecified abdominal hernia without obstruction or gangrene: Secondary | ICD-10-CM | POA: Insufficient documentation

## 2022-12-03 MED ORDER — DICLOFENAC SODIUM 1 % EX GEL: 2.0000 g | Freq: Four times a day (QID) | CUTANEOUS | 0 refills | Status: DC

## 2022-12-03 MED ORDER — DICLOFENAC SODIUM 1 % EX GEL
4.0000 g | Freq: Once | CUTANEOUS | Status: AC
  Administered 2022-12-03: 4 g via TOPICAL
  Filled 2022-12-03: qty 100

## 2022-12-03 NOTE — ED Triage Notes (Signed)
Pt c/o hernia

## 2022-12-03 NOTE — Discharge Instructions (Signed)
Apply Voltaren gel to your hip as prescribed for pain.  Recheck with your primary care provider.  Can follow-up with orthopedics, call to schedule appointment.

## 2022-12-03 NOTE — ED Provider Notes (Signed)
[place] EMERGENCY DEPARTMENT AT [facility] Provider Note   CSN: [id number] Arrival date & time: 12/02/22  1830     History  Chief Complaint  Patient presents with   Hip Pain    Charles [name] is a 52 y.o. male.  52 year old male presents with complaint of ongoing pain in his right hip, feels like it is progressively getting worse.  Denies any falls or injuries.  Patient was told that he has arthritis in the hip.  He is not taking anything for his pain.  No other complaints or concerns today.       Home Medications Prior to Admission medications   Medication Sig Start Date End Date Taking? Authorizing Provider  diclofenac Sodium (VOLTAREN) 1 % GEL Apply 2 g topically 4 (four) times daily. 12/03/22  Yes Jeannie Fend, PA-C  empagliflozin (JARDIANCE) 10 MG TABS tablet Take 1 tablet (10 mg total) by mouth daily. 11/19/22   Glendale Chard, DO  furosemide (LASIX) 40 MG tablet Take 1 tablet (40 mg total) by mouth daily. 11/18/22   Glendale Chard, DO  losartan (COZAAR) 100 MG tablet Take 1 tablet (100 mg total) by mouth daily. 11/18/22 12/18/22  Glendale Chard, DO  mupirocin ointment (BACTROBAN) 2 % Apply 1 Application topically 2 (two) times daily. 11/27/22   Alicia Amel, MD  spironolactone (ALDACTONE) 25 MG tablet Take 1 tablet (25 mg total) by mouth daily. 11/28/22   Alicia Amel, MD      Allergies    Egg-derived products, Banana, and Pork-derived products    Review of Systems   Review of Systems Negative except as per HPI Physical Exam Updated Vital Signs BP (!) 147/103 (BP Location: Left Arm)   Pulse 74   Temp 98.4 F (36.9 C) (Oral)   Resp 17   Ht 5\' 6"  (1.676 m)   Wt 68 kg   SpO2 100%   BMI 24.21 kg/m  Physical Exam Vitals and nursing note reviewed.  Constitutional:      General: He is not in acute distress.    Appearance: He is well-developed. He is not diaphoretic.  HENT:     Head: Normocephalic and atraumatic.  Cardiovascular:     Pulses:  Normal pulses.  Pulmonary:     Effort: Pulmonary effort is normal.  Musculoskeletal:        General: Tenderness present. No swelling, deformity or signs of injury.     Lumbar back: Negative right straight leg raise test and negative left straight leg raise test.       Back:     Right lower leg: No edema.     Left lower leg: No edema.       Legs:  Skin:    General: Skin is warm and dry.     Findings: No erythema or rash.  Neurological:     Mental Status: He is alert and oriented to person, place, and time.     Sensory: No sensory deficit.     Motor: No weakness.  Psychiatric:        Behavior: Behavior normal.     ED Results / Procedures / Treatments   Labs (all labs ordered are listed, but only abnormal results are displayed) Labs Reviewed - No data to display  EKG None  Radiology DG Hip Unilat  With Pelvis 2-3 Views Right  Result Date: 12/02/2022 CLINICAL DATA:  Several months cyst. Right hip pain. No known injury. EXAM: DG HIP (WITH OR WITHOUT  PELVIS) 2-3V RIGHT COMPARISON:  AP pelvis and right hip views 10/18/2022 FINDINGS: There is no evidence of fracture or dislocation. Moderate asymmetric nonerosive degenerative arthrosis of the right hip is again noted and mild enthesopathic changes of the bony pelvis. The left hip, SI joints, and pubic symphysis are unremarkable. Soft tissues are unremarkable. Comparison to the prior study reveals no significant interval change. IMPRESSION: Moderate asymmetric degenerative arthrosis of the right hip with mild enthesopathic changes of the bony pelvis. No evidence of fracture or dislocation. No visible change since 10/18/2022. Electronically Signed   By: Almira Bar M.D.   On: 12/02/2022 20:03    Procedures Procedures    Medications Ordered in ED Medications  diclofenac Sodium (VOLTAREN) 1 % topical gel 4 g (has no administration in time range)    ED Course/ Medical Decision Making/ A&P                             Medical  Decision Making Amount and/or Complexity of Data Reviewed Radiology: ordered.   52 year old male presents with ongoing pain in his right hip.  He points to the lateral aspect of the right hip as the location of his pain.  He is found have tenderness in the area as well as over the SI, negative straight leg raise.  DP pulses present, no overlying skin changes.  X-ray of the right hip with arthritic changes, no acute bony abnormality, agree with radiology interpretation.  Plan is to provide Voltaren gel and referral to orthopedics.  Due to elevated creatinine, unable to provide oral NSAIDs.        Final Clinical Impression(s) / ED Diagnoses Final diagnoses:  Right hip pain  Osteoarthritis of right hip, unspecified osteoarthritis type    Rx / DC Orders ED Discharge Orders          Ordered    diclofenac Sodium (VOLTAREN) 1 % GEL  4 times daily        12/03/22 0015              Jeannie Fend, PA-C 12/03/22 0026    Mesner, Barbara Cower, MD 12/03/22 [phone]

## 2022-12-03 NOTE — ED Provider Notes (Signed)
[place] EMERGENCY DEPARTMENT AT [facility] Provider Note   CSN: [id number] Arrival date & time: 12/03/22  2841     History  Chief Complaint  Patient presents with   Hernia    Jonathan Corpus is a 52 y.o. male.  52 year old male seen here few hours ago for hip pain.  Discharged and reportedly did not have transportation so checked back in to get his hernia checked.  Patient states has been there for a year.  Seems like it might of been more painful here recently.  Normal bowel movements.  No vomiting.  No fevers.  No other associated symptoms.        Home Medications Prior to Admission medications   Medication Sig Start Date End Date Taking? Authorizing Provider  diclofenac Sodium (VOLTAREN) 1 % GEL Apply 2 g topically 4 (four) times daily. 12/03/22   Jeannie Fend, PA-C  empagliflozin (JARDIANCE) 10 MG TABS tablet Take 1 tablet (10 mg total) by mouth daily. 11/19/22   Glendale Chard, DO  furosemide (LASIX) 40 MG tablet Take 1 tablet (40 mg total) by mouth daily. 11/18/22   Glendale Chard, DO  losartan (COZAAR) 100 MG tablet Take 1 tablet (100 mg total) by mouth daily. 11/18/22 12/18/22  Glendale Chard, DO  mupirocin ointment (BACTROBAN) 2 % Apply 1 Application topically 2 (two) times daily. 11/27/22   Alicia Amel, MD  spironolactone (ALDACTONE) 25 MG tablet Take 1 tablet (25 mg total) by mouth daily. 11/28/22   Alicia Amel, MD      Allergies    Egg-derived products, Banana, and Pork-derived products    Review of Systems   Review of Systems  Physical Exam Updated Vital Signs BP (!) 139/97   Pulse 64   Temp (!) 97.4 F (36.3 C) (Oral)   Resp (!) 22   SpO2 98%  Physical Exam Vitals and nursing note reviewed.  Constitutional:      Appearance: He is well-developed.  HENT:     Head: Normocephalic and atraumatic.  Eyes:     Pupils: Pupils are equal, round, and reactive to light.  Cardiovascular:     Rate and Rhythm: Normal rate.  Pulmonary:     Effort:  Pulmonary effort is normal. No respiratory distress.  Abdominal:     General: There is no distension.     Comments: No palpable ventral, umbilical or inguinal hernias.  Definitely no tenderness or erythema.  Musculoskeletal:        General: Normal range of motion.     Cervical back: Normal range of motion.  Neurological:     Mental Status: He is alert.     ED Results / Procedures / Treatments   Labs (all labs ordered are listed, but only abnormal results are displayed) Labs Reviewed - No data to display  EKG None  Radiology DG Hip Unilat  With Pelvis 2-3 Views Right  Result Date: 12/02/2022 CLINICAL DATA:  Several months cyst. Right hip pain. No known injury. EXAM: DG HIP (WITH OR WITHOUT PELVIS) 2-3V RIGHT COMPARISON:  AP pelvis and right hip views 10/18/2022 FINDINGS: There is no evidence of fracture or dislocation. Moderate asymmetric nonerosive degenerative arthrosis of the right hip is again noted and mild enthesopathic changes of the bony pelvis. The left hip, SI joints, and pubic symphysis are unremarkable. Soft tissues are unremarkable. Comparison to the prior study reveals no significant interval change. IMPRESSION: Moderate asymmetric degenerative arthrosis of the right hip with mild enthesopathic changes  of the bony pelvis. No evidence of fracture or dislocation. No visible change since 10/18/2022. Electronically Signed   By: Almira Bar M.D.   On: 12/02/2022 20:03    Procedures Procedures    Medications Ordered in ED Medications - No data to display  ED Course/ Medical Decision Making/ A&P                             Medical Decision Making  No indication for imaging at this time. Suspect malingering. Will refer to [facility] PRN for elective consideration.   Final Clinical Impression(s) / ED Diagnoses Final diagnoses:  Hernia of abdominal cavity    Rx / DC Orders ED Discharge Orders     None         Zariyah Stephens, Barbara Cower, MD 12/03/22 [phone]

## 2022-12-09 ENCOUNTER — Telehealth: Payer: Self-pay

## 2022-12-09 NOTE — Telephone Encounter (Signed)
Transition Care Management Follow-up Telephone Call Date of discharge and from where: 12/03/2022 [facility] How have you been since you were released from the hospital? Patient declined to participate   [facility]  [facility] Population Health Community Resource Care Guide   ??[email or web address]  ?? [phone]   Website: [email or web address]  [email or web address]

## 2022-12-16 ENCOUNTER — Encounter (HOSPITAL_COMMUNITY): Payer: Self-pay

## 2022-12-16 ENCOUNTER — Emergency Department (HOSPITAL_COMMUNITY)
Admission: EM | Admit: 2022-12-16 | Discharge: 2022-12-16 | Disposition: A | Discharge status: Home / Self Care | Payer: Medicare HMO | Source: Home / Self Care | Attending: Emergency Medicine | Admitting: Emergency Medicine

## 2022-12-16 ENCOUNTER — Other Ambulatory Visit: Payer: Self-pay

## 2022-12-16 ENCOUNTER — Emergency Department (HOSPITAL_COMMUNITY): Payer: Medicare HMO

## 2022-12-16 DIAGNOSIS — R112 Nausea with vomiting, unspecified: Secondary | ICD-10-CM | POA: Insufficient documentation

## 2022-12-16 DIAGNOSIS — N189 Chronic kidney disease, unspecified: Secondary | ICD-10-CM | POA: Diagnosis not present

## 2022-12-16 DIAGNOSIS — R739 Hyperglycemia, unspecified: Secondary | ICD-10-CM

## 2022-12-16 DIAGNOSIS — I509 Heart failure, unspecified: Secondary | ICD-10-CM | POA: Diagnosis not present

## 2022-12-16 DIAGNOSIS — R0602 Shortness of breath: Secondary | ICD-10-CM

## 2022-12-16 DIAGNOSIS — Z20822 Contact with and (suspected) exposure to covid-19: Secondary | ICD-10-CM | POA: Diagnosis not present

## 2022-12-16 DIAGNOSIS — E1165 Type 2 diabetes mellitus with hyperglycemia: Secondary | ICD-10-CM | POA: Diagnosis not present

## 2022-12-16 DIAGNOSIS — Z79899 Other long term (current) drug therapy: Secondary | ICD-10-CM | POA: Insufficient documentation

## 2022-12-16 DIAGNOSIS — J449 Chronic obstructive pulmonary disease, unspecified: Secondary | ICD-10-CM | POA: Diagnosis not present

## 2022-12-16 LAB — CBC
HCT: 41.9 % (ref 39.0–52.0)
Hemoglobin: 13.9 g/dL (ref 13.0–17.0)
MCH: 35.4 pg — ABNORMAL HIGH (ref 26.0–34.0)
MCHC: 33.2 g/dL (ref 30.0–36.0)
MCV: 106.6 fL — ABNORMAL HIGH (ref 80.0–100.0)
Platelets: 172 10*3/uL (ref 150–400)
RBC: 3.93 MIL/uL — ABNORMAL LOW (ref 4.22–5.81)
RDW: 16.8 % — ABNORMAL HIGH (ref 11.5–15.5)
WBC: 2.2 10*3/uL — ABNORMAL LOW (ref 4.0–10.5)
nRBC: 0 % (ref 0.0–0.2)

## 2022-12-16 LAB — RESP PANEL BY RT-PCR (RSV, FLU A&B, COVID)  RVPGX2
Influenza A by PCR: NEGATIVE
Influenza B by PCR: NEGATIVE
Resp Syncytial Virus by PCR: NEGATIVE
SARS Coronavirus 2 by RT PCR: NEGATIVE

## 2022-12-16 LAB — COMPREHENSIVE METABOLIC PANEL
ALT: 21 U/L (ref 0–44)
AST: 27 U/L (ref 15–41)
Albumin: 3 g/dL — ABNORMAL LOW (ref 3.5–5.0)
Alkaline Phosphatase: 64 U/L (ref 38–126)
Anion gap: 12 (ref 5–15)
BUN: 19 mg/dL (ref 6–20)
CO2: 23 mmol/L (ref 22–32)
Calcium: 9.1 mg/dL (ref 8.9–10.3)
Chloride: 105 mmol/L (ref 98–111)
Creatinine, Ser: 1.58 mg/dL — ABNORMAL HIGH (ref 0.61–1.24)
GFR, Estimated: 53 mL/min — ABNORMAL LOW (ref >60)
Glucose, Bld: 235 mg/dL — ABNORMAL HIGH (ref 70–99)
Potassium: 3.8 mmol/L (ref 3.5–5.1)
Sodium: 140 mmol/L (ref 135–145)
Total Bilirubin: 0.9 mg/dL (ref 0.3–1.2)
Total Protein: 6.8 g/dL (ref 6.5–8.1)

## 2022-12-16 LAB — MAGNESIUM: Magnesium: 1.9 mg/dL (ref 1.7–2.4)

## 2022-12-16 LAB — TROPONIN I (HIGH SENSITIVITY)
Troponin I (High Sensitivity): 60 ng/L — ABNORMAL HIGH (ref <18)
Troponin I (High Sensitivity): 59 ng/L — ABNORMAL HIGH (ref <18)

## 2022-12-16 LAB — BRAIN NATRIURETIC PEPTIDE: B Natriuretic Peptide: 1375.2 pg/mL — ABNORMAL HIGH (ref 0.0–100.0)

## 2022-12-16 LAB — CBG MONITORING, ED: Glucose-Capillary: 80 mg/dL (ref 70–99)

## 2022-12-16 LAB — LIPASE, BLOOD: Lipase: 29 U/L (ref 11–51)

## 2022-12-16 MED ORDER — SODIUM CHLORIDE 0.9 % IV BOLUS
500.0000 mL | Freq: Once | INTRAVENOUS | Status: AC
  Administered 2022-12-16: 500 mL via INTRAVENOUS

## 2022-12-16 MED ORDER — FUROSEMIDE 10 MG/ML IJ SOLN
40.0000 mg | Freq: Once | INTRAMUSCULAR | Status: AC
  Administered 2022-12-16: 40 mg via INTRAVENOUS
  Filled 2022-12-16: qty 4

## 2022-12-16 MED ORDER — ONDANSETRON HCL 4 MG PO TABS: 4.0000 mg | ORAL_TABLET | Freq: Four times a day (QID) | ORAL | 0 refills | Status: DC

## 2022-12-16 MED ORDER — ONDANSETRON HCL 4 MG/2ML IJ SOLN
4.0000 mg | Freq: Once | INTRAMUSCULAR | Status: AC
  Administered 2022-12-16: 4 mg via INTRAVENOUS
  Filled 2022-12-16: qty 2

## 2022-12-16 NOTE — Discharge Instructions (Signed)
You were seen in the emergency department for nausea, vomiting and shortness of breath.  Your workup showed that you may have had a mild amount of fluid from your heart failure but no significant fluid on your x-ray.  You have no signs of severe dehydration and you tested negative for COVID and flu.  You may have another viral infection causing your symptoms.  We did give you a dose of Lasix in the emergency department and you should continue to take your home Lasix as prescribed.  Your initial blood sugar was mildly elevated and you can follow-up with your primary doctor to have your blood sugar rechecked.  You should return to the emergency department if you are having repetitive vomiting despite nausea medicine, significantly worsening shortness of breath, severe chest or abdominal pain or any other new or concerning symptoms.

## 2022-12-16 NOTE — ED Triage Notes (Addendum)
error 

## 2022-12-16 NOTE — ED Provider Triage Note (Signed)
Emergency Medicine Provider Triage Evaluation Note  Charles [name] , a 52 y.o. male  was evaluated in triage.  Pt complains of weakness, chills, cough for the past 2 days.  Denies any fevers.  Denies any nausea or vomiting.  Denies any abdominal pain.  Review of Systems  Positive: As above Negative: As above  Physical Exam  BP (!) 157/115   Pulse 94   Temp 98.3 F (36.8 C)   Resp 20   Ht 5\' 6"  (1.676 m)   Wt 68 kg   SpO2 99%   BMI 24.20 kg/m  Gen:   Awake, no distress   Resp:  Normal effort  MSK:   Moves extremities without difficulty  Other:  Regular rate and rhythm, breath sounds equal bilaterally  Medical Decision Making  Medically screening exam initiated at 4:34 PM.  Appropriate orders placed.  Charles [name] was informed that the remainder of the evaluation will be completed by another provider, this initial triage assessment does not replace that evaluation, and the importance of remaining in the ED until their evaluation is complete.     Arabella Merles, PA-C 12/16/22 [phone]

## 2022-12-16 NOTE — ED Provider Notes (Signed)
[place] EMERGENCY DEPARTMENT AT [facility] Provider Note   CSN: [id number] Arrival date & time: 12/16/22  1532     History  Chief Complaint  Patient presents with   Shortness of Breath    Charles [name] is a 52 y.o. male.  Patient is a 52 year old male with a past medical history of CHF, COPD, CKD presenting to the emergency department with nausea, vomiting and shortness of breath.  Patient states that his symptoms have been going on for the last 2 days.  He denies any associated chest pain or abdominal pain.  He states he had a mild nonproductive cough.  He denies any fevers or lower extremity swelling.  He denies any associated diarrhea or constipation.  The history is provided by the patient.  Shortness of Breath      Home Medications Prior to Admission medications   Medication Sig Start Date End Date Taking? Authorizing Provider  ondansetron (ZOFRAN) 4 MG tablet Take 1 tablet (4 mg total) by mouth every 6 (six) hours. 12/16/22  Yes Elayne Snare K, DO  diclofenac Sodium (VOLTAREN) 1 % GEL Apply 2 g topically 4 (four) times daily. 12/03/22   Jeannie Fend, PA-C  empagliflozin (JARDIANCE) 10 MG TABS tablet Take 1 tablet (10 mg total) by mouth daily. 11/19/22   Glendale Chard, DO  furosemide (LASIX) 40 MG tablet Take 1 tablet (40 mg total) by mouth daily. 11/18/22   Glendale Chard, DO  losartan (COZAAR) 100 MG tablet Take 1 tablet (100 mg total) by mouth daily. 11/18/22 12/18/22  Glendale Chard, DO  mupirocin ointment (BACTROBAN) 2 % Apply 1 Application topically 2 (two) times daily. 11/27/22   Alicia Amel, MD  spironolactone (ALDACTONE) 25 MG tablet Take 1 tablet (25 mg total) by mouth daily. 11/28/22   Alicia Amel, MD      Allergies    Egg-derived products, Banana, and Pork-derived products    Review of Systems   Review of Systems  Respiratory:  Positive for shortness of breath.     Physical Exam Updated Vital Signs BP (!) 166/118   Pulse (!) 105    Temp 98.3 F (36.8 C)   Resp 20   Ht 5\' 6"  (1.676 m)   Wt 68 kg   SpO2 93%   BMI 24.20 kg/m  Physical Exam Vitals and nursing note reviewed.  Constitutional:      General: He is not in acute distress.    Appearance: He is well-developed.  HENT:     Head: Normocephalic and atraumatic.     Mouth/Throat:     Mouth: Mucous membranes are moist.     Pharynx: Oropharynx is clear.  Eyes:     Extraocular Movements: Extraocular movements intact.  Cardiovascular:     Rate and Rhythm: Normal rate and regular rhythm.  Pulmonary:     Effort: Pulmonary effort is normal.     Breath sounds: Normal breath sounds.  Abdominal:     Palpations: Abdomen is soft.     Tenderness: There is no abdominal tenderness.  Musculoskeletal:        General: Normal range of motion.     Cervical back: Normal range of motion and neck supple.     Right lower leg: No edema.     Left lower leg: No edema.  Skin:    General: Skin is warm and dry.  Neurological:     General: No focal deficit present.     Mental Status: He  is alert and oriented to person, place, and time.  Psychiatric:        Mood and Affect: Mood normal.        Behavior: Behavior normal.     ED Results / Procedures / Treatments   Labs (all labs ordered are listed, but only abnormal results are displayed) Labs Reviewed  COMPREHENSIVE METABOLIC PANEL - Abnormal; Notable for the following components:      Result Value   Glucose, Bld 235 (*)    Creatinine, Ser 1.58 (*)    Albumin 3.0 (*)    GFR, Estimated 53 (*)    All other components within normal limits  CBC - Abnormal; Notable for the following components:   WBC 2.2 (*)    RBC 3.93 (*)    MCV 106.6 (*)    MCH 35.4 (*)    RDW 16.8 (*)    All other components within normal limits  BRAIN NATRIURETIC PEPTIDE - Abnormal; Notable for the following components:   B Natriuretic Peptide 1,375.2 (*)    All other components within normal limits  TROPONIN I (HIGH SENSITIVITY) - Abnormal;  Notable for the following components:   Troponin I (High Sensitivity) 60 (*)    All other components within normal limits  TROPONIN I (HIGH SENSITIVITY) - Abnormal; Notable for the following components:   Troponin I (High Sensitivity) 59 (*)    All other components within normal limits  RESP PANEL BY RT-PCR (RSV, FLU A&B, COVID)  RVPGX2  LIPASE, BLOOD  MAGNESIUM  CBG MONITORING, ED    EKG EKG Interpretation Date/Time:  Monday December 16 2022 15:35:57 EDT Ventricular Rate:  92 PR Interval:  158 QRS Duration:  66 QT Interval:  444 QTC Calculation: 549 R Axis:   85  Text Interpretation: Normal sinus rhythm Moderate voltage criteria for LVH, may be normal variant ( Sokolow-Lyon , Cornell product ) Anterolateral infarct , age undetermined Prolonged QT Abnormal ECG When compared with ECG of 16-Dec-2022 15:34, PREVIOUS ECG IS PRESENT Confirmed by Elayne Snare ([id number]) on 12/16/2022 8:10:45 PM  Radiology DG Chest 1 View  Result Date: 12/16/2022 CLINICAL DATA:  141880 SOB (shortness of breath) 141880 EXAM: CHEST  1 VIEW COMPARISON:  Chest x-ray 11/26/2022. FINDINGS: The heart and mediastinal contours are within normal limits. No focal consolidation. No pulmonary edema. No pleural effusion. No pneumothorax. No acute osseous abnormality. Left upper quadrant metallic density suggests vascular coiling. IMPRESSION: No active disease. Electronically Signed   By: Tish Frederickson M.D.   On: 12/16/2022 16:54    Procedures Procedures    Medications Ordered in ED Medications  ondansetron [facility]) injection 4 mg (4 mg Intravenous Given 12/16/22 2106)  sodium chloride 0.9 % bolus 500 mL (0 mLs Intravenous Stopped 12/16/22 2152)  furosemide (LASIX) injection 40 mg (40 mg Intravenous Given 12/16/22 2200)    ED Course/ Medical Decision Making/ A&P Clinical Course as of 12/16/22 2305  Mon Dec 16, 2022  2054 Rpt trop flat. [VK]  2138 BNP elevated, not as high as usual but in the setting of SOB will be  given IV lasix. [VK]  2236 Blood sugar normalized. He was recommended to follow up with PCP for glucose recheck to determine if he needs to be started on diabetes treatment. [VK]    Clinical Course User Index [VK] Rexford Maus, DO  Medical Decision Making This patient presents to the ED with chief complaint(s) of nausea, vomiting, shortness of breath with pertinent past medical history of CHF, COPD, CKD which further complicates the presenting complaint. The complaint involves an extensive differential diagnosis and also carries with it a high risk of complications and morbidity.    The differential diagnosis includes patient has no wheezing on exam making COPD exacerbation unlikely, considering CHF, pulmonary edema, pleural effusion, pneumonia, pneumothorax, ACS, arrhythmia, anemia, dehydration, electrolyte abnormality, gastritis, GERD, viral syndrome, pancreatitis, hepatitis  Additional history obtained: Additional history obtained from N/A Records reviewed previous admission documents  ED Course and Reassessment: On patient's arrival he was hemodynamically stable in no acute distress.  He was initially evaluated by provider in triage and had EKG and labs including viral swab performed.  He will additionally have lipase, mag and BNP.  The patient's labs showed leukopenia at his baseline and creatinine at his baseline, viral swab is negative.  Troponin was mildly elevated with repeat pending.  EKG had no acute ischemic changes.  Chest x-ray showed no acute disease.  Patient's labs did show a new hyperglycemia without known history of diabetes.  He will be given gentle fluids and will have a glucose recheck.  Independent labs interpretation:  The following labs were independently interpreted: Hyperglycemia, mildly elevated troponin are flat, elevated BNP though lower from his baseline, otherwise labs within patient's normal range  Independent  visualization of imaging: - I independently visualized the following imaging with scope of interpretation limited to determining acute life threatening conditions related to emergency care: Chest x-ray, which revealed no acute disease  Consultation: - Consulted or discussed management/test interpretation w/ external professional: N/A  Consideration for admission or further workup: Patient has no emergent conditions requiring admission or further work-up at this time and is stable for discharge home with primary care follow-up  Social Determinants of health: N/A    Amount and/or Complexity of Data Reviewed Labs: ordered. Radiology: ordered.  Risk Prescription drug management.          Final Clinical Impression(s) / ED Diagnoses Final diagnoses:  Shortness of breath  Hyperglycemia  Nausea and vomiting, unspecified vomiting type    Rx / DC Orders ED Discharge Orders          Ordered    ondansetron (ZOFRAN) 4 MG tablet  Every 6 hours        12/16/22 2305              Rexford Maus, DO 12/16/22 2305

## 2022-12-16 NOTE — ED Triage Notes (Signed)
Pt c/o SOB, weakness, fatigue and right side painx2d. Pt denies chest pain

## 2022-12-21 ENCOUNTER — Other Ambulatory Visit (HOSPITAL_COMMUNITY): Payer: Self-pay

## 2022-12-21 ENCOUNTER — Emergency Department (HOSPITAL_COMMUNITY): Payer: Medicare HMO

## 2022-12-21 ENCOUNTER — Other Ambulatory Visit: Payer: Self-pay

## 2022-12-21 ENCOUNTER — Emergency Department (HOSPITAL_COMMUNITY)
Admission: EM | Admit: 2022-12-21 | Discharge: 2022-12-21 | Disposition: A | Discharge status: Home / Self Care | Payer: Medicare HMO | Attending: Emergency Medicine | Admitting: Emergency Medicine

## 2022-12-21 DIAGNOSIS — R7989 Other specified abnormal findings of blood chemistry: Secondary | ICD-10-CM | POA: Insufficient documentation

## 2022-12-21 DIAGNOSIS — I10 Essential (primary) hypertension: Secondary | ICD-10-CM | POA: Diagnosis not present

## 2022-12-21 DIAGNOSIS — I517 Cardiomegaly: Secondary | ICD-10-CM | POA: Diagnosis not present

## 2022-12-21 DIAGNOSIS — R5383 Other fatigue: Secondary | ICD-10-CM | POA: Insufficient documentation

## 2022-12-21 DIAGNOSIS — Z79899 Other long term (current) drug therapy: Secondary | ICD-10-CM | POA: Insufficient documentation

## 2022-12-21 DIAGNOSIS — R0602 Shortness of breath: Secondary | ICD-10-CM | POA: Insufficient documentation

## 2022-12-21 LAB — URINALYSIS, ROUTINE W REFLEX MICROSCOPIC
Bilirubin Urine: NEGATIVE
Glucose, UA: 50 mg/dL — AB
Hgb urine dipstick: NEGATIVE
Ketones, ur: NEGATIVE mg/dL
Leukocytes,Ua: NEGATIVE
Nitrite: NEGATIVE
Protein, ur: NEGATIVE mg/dL
Specific Gravity, Urine: 1.02 (ref 1.005–1.030)
pH: 6 (ref 5.0–8.0)

## 2022-12-21 LAB — CBC
HCT: 41.5 % (ref 39.0–52.0)
Hemoglobin: 13.8 g/dL (ref 13.0–17.0)
MCH: 34.8 pg — ABNORMAL HIGH (ref 26.0–34.0)
MCHC: 33.3 g/dL (ref 30.0–36.0)
MCV: 104.8 fL — ABNORMAL HIGH (ref 80.0–100.0)
Platelets: 151 10*3/uL (ref 150–400)
RBC: 3.96 MIL/uL — ABNORMAL LOW (ref 4.22–5.81)
RDW: 16.6 % — ABNORMAL HIGH (ref 11.5–15.5)
WBC: 3.6 10*3/uL — ABNORMAL LOW (ref 4.0–10.5)
nRBC: 0 % (ref 0.0–0.2)

## 2022-12-21 LAB — BRAIN NATRIURETIC PEPTIDE: B Natriuretic Peptide: 1430.6 pg/mL — ABNORMAL HIGH (ref 0.0–100.0)

## 2022-12-21 LAB — BASIC METABOLIC PANEL
Anion gap: 8 (ref 5–15)
BUN: 19 mg/dL (ref 6–20)
CO2: 24 mmol/L (ref 22–32)
Calcium: 9.1 mg/dL (ref 8.9–10.3)
Chloride: 105 mmol/L (ref 98–111)
Creatinine, Ser: 1.33 mg/dL — ABNORMAL HIGH (ref 0.61–1.24)
GFR, Estimated: >60 mL/min (ref >60)
Glucose, Bld: 132 mg/dL — ABNORMAL HIGH (ref 70–99)
Potassium: 3.9 mmol/L (ref 3.5–5.1)
Sodium: 137 mmol/L (ref 135–145)

## 2022-12-21 MED ORDER — FUROSEMIDE 10 MG/ML IJ SOLN
40.0000 mg | Freq: Once | INTRAMUSCULAR | Status: AC
  Administered 2022-12-21: 40 mg via INTRAVENOUS
  Filled 2022-12-21: qty 4

## 2022-12-21 MED ORDER — EMPAGLIFLOZIN 10 MG PO TABS
10.0000 mg | ORAL_TABLET | Freq: Every day | ORAL | 1 refills | Status: DC
  Filled 2022-12-21: qty 30, 30d supply, fill #0

## 2022-12-21 MED ORDER — FUROSEMIDE 40 MG PO TABS
40.0000 mg | ORAL_TABLET | Freq: Every day | ORAL | 1 refills | Status: DC
  Filled 2022-12-21: qty 30, 30d supply, fill #0

## 2022-12-21 MED ORDER — LOSARTAN POTASSIUM 100 MG PO TABS
100.0000 mg | ORAL_TABLET | Freq: Every day | ORAL | 1 refills | Status: DC
  Filled 2022-12-21: qty 30, 30d supply, fill #0

## 2022-12-21 NOTE — Discharge Instructions (Signed)
It was a pleasure taking part in your care today.  We discussed, your workup is reassuring.  I have represcribed your home medications to the [facility].  Please pick these up at your earliest convenience.  Please follow-up with your PCP, Dr. Parke Simmers, at your earliest convenience.  He may return to the ED with any new or worsening signs or symptoms in the meantime.  I have also attached resources concerning drug counseling in the area.  Please refrain from using crack cocaine.
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
## 2022-12-21 NOTE — ED Provider Triage Note (Signed)
Emergency Medicine Provider Triage Evaluation Note  Charles [name] , a 52 y.o. male  was evaluated in triage.  Pt complains of weakness since this morning.  Review of Systems  Positive: Weakness, fatigue shortness of breath, chills Negative: Chest pain, fever  Physical Exam  BP (!) 147/91 (BP Location: Left Arm)   Pulse 80   Temp 98.1 F (36.7 C) (Oral)   Resp 16   SpO2 98%  Gen:   Awake, no distress   Resp:  Normal effort  MSK:   Moves extremities without difficulty. R hip pain due to arthritis Other:  Upper arm and grip strength intact, lower extremity strength intact  Medical Decision Making  Medically screening exam initiated at 2:48 PM.  Appropriate orders placed.  Adrain Giacomo was informed that the remainder of the evaluation will be completed by another provider, this initial triage assessment does not replace that evaluation, and the importance of remaining in the ED until their evaluation is complete.    Maxwell Marion, PA-C 12/21/22 1450
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
## 2022-12-21 NOTE — ED Notes (Signed)
Pt given a urinal and aware we need a sample.  Also, Pt given soda, sandwich, cheese, graham crackers, and peanut butter w/ EDP permission.

## 2022-12-21 NOTE — ED Notes (Signed)
Lab made aware of BNP add-on.  ?

## 2022-12-23 ENCOUNTER — Other Ambulatory Visit (HOSPITAL_COMMUNITY): Payer: Self-pay

## 2022-12-27 ENCOUNTER — Telehealth: Payer: Self-pay | Admitting: *Deleted

## 2022-12-27 NOTE — Telephone Encounter (Signed)
Transition Care Management Unsuccessful Follow-up Telephone Call  Date of discharge and from where:  [facility] 12/21/2022  Attempts:  1st Attempt  Reason for unsuccessful TCM follow-up call:  No answer/busy

## 2022-12-30 ENCOUNTER — Telehealth: Payer: Self-pay | Admitting: *Deleted

## 2022-12-30 ENCOUNTER — Encounter: Payer: Self-pay | Admitting: Pharmacist

## 2022-12-30 NOTE — Progress Notes (Signed)
Adherence check-in. Fills for empagliflozin in both current month and last month - 30 day fills - on schedule for adherence.

## 2022-12-30 NOTE — Telephone Encounter (Signed)
Transition Care Management Unsuccessful Follow-up Telephone Call  Date of discharge and from where:  [name] 12/21/2022  Attempts:  2nd Attempt  Reason for unsuccessful TCM follow-up call:  No answer/busy

## 2022-12-31 ENCOUNTER — Other Ambulatory Visit (HOSPITAL_COMMUNITY): Payer: Self-pay

## 2023-01-12 ENCOUNTER — Encounter (HOSPITAL_COMMUNITY): Payer: Self-pay

## 2023-01-12 ENCOUNTER — Emergency Department (HOSPITAL_COMMUNITY): Payer: Medicare HMO

## 2023-01-12 ENCOUNTER — Other Ambulatory Visit: Payer: Self-pay

## 2023-01-12 ENCOUNTER — Emergency Department (HOSPITAL_COMMUNITY)
Admission: EM | Admit: 2023-01-12 | Discharge: 2023-01-12 | Disposition: A | Discharge status: Home / Self Care | Payer: Medicare HMO | Attending: Emergency Medicine | Admitting: Emergency Medicine

## 2023-01-12 ENCOUNTER — Emergency Department (HOSPITAL_COMMUNITY)
Admission: EM | Admit: 2023-01-12 | Discharge: 2023-01-12 | Disposition: A | Discharge status: Home / Self Care | Payer: Medicare HMO | Source: Home / Self Care | Attending: Emergency Medicine | Admitting: Emergency Medicine

## 2023-01-12 DIAGNOSIS — M791 Myalgia, unspecified site: Secondary | ICD-10-CM | POA: Insufficient documentation

## 2023-01-12 DIAGNOSIS — R0602 Shortness of breath: Secondary | ICD-10-CM | POA: Insufficient documentation

## 2023-01-12 DIAGNOSIS — R531 Weakness: Secondary | ICD-10-CM | POA: Diagnosis not present

## 2023-01-12 DIAGNOSIS — I509 Heart failure, unspecified: Secondary | ICD-10-CM | POA: Insufficient documentation

## 2023-01-12 DIAGNOSIS — Z79899 Other long term (current) drug therapy: Secondary | ICD-10-CM | POA: Insufficient documentation

## 2023-01-12 DIAGNOSIS — R600 Localized edema: Secondary | ICD-10-CM | POA: Insufficient documentation

## 2023-01-12 DIAGNOSIS — M7918 Myalgia, other site: Secondary | ICD-10-CM | POA: Insufficient documentation

## 2023-01-12 DIAGNOSIS — R0902 Hypoxemia: Secondary | ICD-10-CM | POA: Diagnosis not present

## 2023-01-12 DIAGNOSIS — R52 Pain, unspecified: Secondary | ICD-10-CM

## 2023-01-12 DIAGNOSIS — I10 Essential (primary) hypertension: Secondary | ICD-10-CM | POA: Diagnosis not present

## 2023-01-12 DIAGNOSIS — M545 Low back pain, unspecified: Secondary | ICD-10-CM | POA: Insufficient documentation

## 2023-01-12 DIAGNOSIS — I517 Cardiomegaly: Secondary | ICD-10-CM | POA: Diagnosis not present

## 2023-01-12 DIAGNOSIS — Z20822 Contact with and (suspected) exposure to covid-19: Secondary | ICD-10-CM | POA: Insufficient documentation

## 2023-01-12 DIAGNOSIS — N189 Chronic kidney disease, unspecified: Secondary | ICD-10-CM | POA: Insufficient documentation

## 2023-01-12 DIAGNOSIS — F1490 Cocaine use, unspecified, uncomplicated: Secondary | ICD-10-CM | POA: Diagnosis not present

## 2023-01-12 DIAGNOSIS — I130 Hypertensive heart and chronic kidney disease with heart failure and stage 1 through stage 4 chronic kidney disease, or unspecified chronic kidney disease: Secondary | ICD-10-CM | POA: Insufficient documentation

## 2023-01-12 DIAGNOSIS — J449 Chronic obstructive pulmonary disease, unspecified: Secondary | ICD-10-CM | POA: Insufficient documentation

## 2023-01-12 LAB — RESP PANEL BY RT-PCR (RSV, FLU A&B, COVID)  RVPGX2
Influenza A by PCR: NEGATIVE
Influenza B by PCR: NEGATIVE
Resp Syncytial Virus by PCR: NEGATIVE
SARS Coronavirus 2 by RT PCR: NEGATIVE

## 2023-01-12 LAB — COMPREHENSIVE METABOLIC PANEL
ALT: 22 U/L (ref 0–44)
AST: 36 U/L (ref 15–41)
Albumin: 3.3 g/dL — ABNORMAL LOW (ref 3.5–5.0)
Alkaline Phosphatase: 62 U/L (ref 38–126)
Anion gap: 13 (ref 5–15)
BUN: 20 mg/dL (ref 6–20)
CO2: 21 mmol/L — ABNORMAL LOW (ref 22–32)
Calcium: 8.9 mg/dL (ref 8.9–10.3)
Chloride: 104 mmol/L (ref 98–111)
Creatinine, Ser: 1.36 mg/dL — ABNORMAL HIGH (ref 0.61–1.24)
GFR, Estimated: >60 mL/min (ref >60)
Glucose, Bld: 102 mg/dL — ABNORMAL HIGH (ref 70–99)
Potassium: 4.1 mmol/L (ref 3.5–5.1)
Sodium: 138 mmol/L (ref 135–145)
Total Bilirubin: 0.7 mg/dL (ref 0.3–1.2)
Total Protein: 6.5 g/dL (ref 6.5–8.1)

## 2023-01-12 LAB — CBC WITH DIFFERENTIAL/PLATELET
Abs Immature Granulocytes: 0 10*3/uL (ref 0.00–0.07)
Basophils Absolute: 0 10*3/uL (ref 0.0–0.1)
Basophils Relative: 0 %
Eosinophils Absolute: 0 10*3/uL (ref 0.0–0.5)
Eosinophils Relative: 1 %
HCT: 33.3 % — ABNORMAL LOW (ref 39.0–52.0)
Hemoglobin: 11.1 g/dL — ABNORMAL LOW (ref 13.0–17.0)
Immature Granulocytes: 0 %
Lymphocytes Relative: 18 %
Lymphs Abs: 0.7 10*3/uL (ref 0.7–4.0)
MCH: 34 pg (ref 26.0–34.0)
MCHC: 33.3 g/dL (ref 30.0–36.0)
MCV: 102.1 fL — ABNORMAL HIGH (ref 80.0–100.0)
Monocytes Absolute: 0.6 10*3/uL (ref 0.1–1.0)
Monocytes Relative: 14 %
Neutro Abs: 2.8 10*3/uL (ref 1.7–7.7)
Neutrophils Relative %: 67 %
Platelets: 124 10*3/uL — ABNORMAL LOW (ref 150–400)
RBC: 3.26 MIL/uL — ABNORMAL LOW (ref 4.22–5.81)
RDW: 16.7 % — ABNORMAL HIGH (ref 11.5–15.5)
WBC: 4.1 10*3/uL (ref 4.0–10.5)
nRBC: 0 % (ref 0.0–0.2)

## 2023-01-12 LAB — RAPID URINE DRUG SCREEN, HOSP PERFORMED
Amphetamines: NOT DETECTED
Barbiturates: NOT DETECTED
Benzodiazepines: NOT DETECTED
Cocaine: POSITIVE — AB
Opiates: NOT DETECTED
Tetrahydrocannabinol: NOT DETECTED

## 2023-01-12 LAB — BRAIN NATRIURETIC PEPTIDE: B Natriuretic Peptide: 1327.9 pg/mL — ABNORMAL HIGH (ref 0.0–100.0)

## 2023-01-12 LAB — TROPONIN I (HIGH SENSITIVITY)
Troponin I (High Sensitivity): 91 ng/L — ABNORMAL HIGH (ref <18)
Troponin I (High Sensitivity): 83 ng/L — ABNORMAL HIGH (ref <18)

## 2023-01-12 LAB — ETHANOL: Alcohol, Ethyl (B): <10 mg/dL (ref <10)

## 2023-01-12 MED ORDER — KETOROLAC TROMETHAMINE 15 MG/ML IJ SOLN
30.0000 mg | Freq: Once | INTRAMUSCULAR | Status: AC
  Administered 2023-01-12: 30 mg via INTRAMUSCULAR
  Filled 2023-01-12: qty 2

## 2023-01-12 MED ORDER — KETOROLAC TROMETHAMINE 15 MG/ML IJ SOLN: 30.0000 mg | Freq: Once | INTRAMUSCULAR | Status: DC

## 2023-01-12 MED ORDER — ACETAMINOPHEN 325 MG PO TABS
650.0000 mg | ORAL_TABLET | Freq: Once | ORAL | Status: AC
  Administered 2023-01-12: 650 mg via ORAL
  Filled 2023-01-12: qty 2

## 2023-01-12 MED ORDER — CYCLOBENZAPRINE HCL 10 MG PO TABS
5.0000 mg | ORAL_TABLET | Freq: Once | ORAL | Status: AC
  Administered 2023-01-12: 5 mg via ORAL
  Filled 2023-01-12: qty 1

## 2023-01-12 MED ORDER — FUROSEMIDE 20 MG PO TABS
40.0000 mg | ORAL_TABLET | Freq: Once | ORAL | Status: AC
  Administered 2023-01-12: 40 mg via ORAL
  Filled 2023-01-12: qty 2

## 2023-01-12 MED ORDER — FUROSEMIDE 10 MG/ML IJ SOLN
40.0000 mg | INTRAMUSCULAR | Status: AC
  Administered 2023-01-12: 40 mg via INTRAVENOUS
  Filled 2023-01-12: qty 4

## 2023-01-12 MED ORDER — LIDOCAINE 5 % EX PTCH
1.0000 | MEDICATED_PATCH | CUTANEOUS | Status: DC
  Administered 2023-01-12: 1 via TRANSDERMAL
  Filled 2023-01-12: qty 1

## 2023-01-12 NOTE — ED Triage Notes (Signed)
Pt arrived POV stating he was here yesterday and they did not check out his problem so he wants answers. Pt states his whole body hurts and it started yesterday.

## 2023-01-12 NOTE — ED Provider Notes (Signed)
Charles [name]   CSN: [id number] Arrival date & time: 01/12/23  0243     History  Chief Complaint  Patient presents with   Generalized Body Aches    Charles [name] is a 52 y.o. male.  The history is provided by the patient and medical records.   52 year old male with history of CHF, EF 30 to 35% on last echo, COPD, chronic kidney disease, chronic hepatitis C, hypertension, polysubstance abuse, presenting to the ED with generalized body aches and "pain all over".  States he has chest pain and shortness of breath and overall "does not feel well".  He denies any abdominal pain, nausea, or vomiting.  He denies any fever.  Reports he has been coughing up "thick crap" that looks "white/yellow" per patient.  Denies hemoptysis.  Admits to ongoing cocaine use, last use about 1 hour PTA.  Denies other drug use tonight.  Home Medications Prior to Admission medications   Medication Sig Start Date End Date Taking? Authorizing Provider  diclofenac Sodium (VOLTAREN) 1 % GEL Apply 2 g topically 4 (four) times daily. 12/03/22   Jeannie Fend, PA-C  empagliflozin (JARDIANCE) 10 MG TABS tablet Take 1 tablet (10 mg total) by mouth daily before breakfast. 12/21/22   Al Decant, PA-C  furosemide (LASIX) 40 MG tablet Take 1 tablet (40 mg total) by mouth daily. 12/21/22   Al Decant, PA-C  losartan (COZAAR) 100 MG tablet Take 1 tablet (100 mg total) by mouth daily. 12/21/22   Al Decant, PA-C  mupirocin ointment (BACTROBAN) 2 % Apply 1 Application topically 2 (two) times daily. 11/27/22   Alicia Amel, MD  ondansetron (ZOFRAN) 4 MG tablet Take 1 tablet (4 mg total) by mouth every 6 (six) hours. 12/16/22   Rexford Maus, DO  spironolactone (ALDACTONE) 25 MG tablet Take 1 tablet (25 mg total) by mouth daily. 11/28/22   Alicia Amel, MD      Allergies    Egg-derived products, Banana, and Pork-derived products     Review of Systems   Review of Systems  Respiratory:  Positive for shortness of breath.   Cardiovascular:  Positive for chest pain.  Musculoskeletal:  Positive for myalgias.  All other systems reviewed and are negative.   Physical Exam Updated Vital Signs BP (!) 139/99   Pulse 92   Temp 97.9 F (36.6 C) (Oral)   Resp 17   Ht 5\' 6"  (1.676 m)   Wt 68 kg   SpO2 96%   BMI 24.21 kg/m   Physical Exam Vitals and nursing [name] reviewed.  Constitutional:      Appearance: He is well-developed.     Comments: Sleeping on stretcher, awoken for exam  HENT:     Head: Normocephalic and atraumatic.  Eyes:     Conjunctiva/sclera: Conjunctivae normal.     Pupils: Pupils are equal, round, and reactive to light.  Cardiovascular:     Rate and Rhythm: Normal rate and regular rhythm.     Heart sounds: Normal heart sounds.  Pulmonary:     Effort: Pulmonary effort is normal. No respiratory distress.     Breath sounds: Normal breath sounds. No rhonchi.  Abdominal:     General: Bowel sounds are normal.     Palpations: Abdomen is soft.     Tenderness: There is no abdominal tenderness. There is no rebound.  Musculoskeletal:        General: Normal range  of motion.     Cervical back: Normal range of motion.     Comments: No significant LE edema noted  Skin:    General: Skin is warm and dry.  Neurological:     Mental Status: He is alert and oriented to person, place, and time.     ED Results / Procedures / Treatments   Labs (all labs ordered are listed, but only abnormal results are displayed) Labs Reviewed  CBC WITH DIFFERENTIAL/PLATELET - Abnormal; Notable for the following components:      Result Value   RBC 3.26 (*)    Hemoglobin 11.1 (*)    HCT 33.3 (*)    MCV 102.1 (*)    RDW 16.7 (*)    Platelets 124 (*)    All other components within normal limits  BRAIN NATRIURETIC PEPTIDE - Abnormal; Notable for the following components:   B Natriuretic Peptide 1,327.9 (*)    All other  components within normal limits  COMPREHENSIVE METABOLIC PANEL - Abnormal; Notable for the following components:   CO2 21 (*)    Glucose, Bld 102 (*)    Creatinine, Ser 1.36 (*)    Albumin 3.3 (*)    All other components within normal limits  TROPONIN I (HIGH SENSITIVITY) - Abnormal; Notable for the following components:   Troponin I (High Sensitivity) 91 (*)    All other components within normal limits  TROPONIN I (HIGH SENSITIVITY) - Abnormal; Notable for the following components:   Troponin I (High Sensitivity) 83 (*)    All other components within normal limits  RESP PANEL BY RT-PCR (RSV, FLU A&B, COVID)  RVPGX2  ETHANOL  RAPID URINE DRUG SCREEN, HOSP PERFORMED    EKG EKG Interpretation Date/Time:  Sunday January 12 2023 04:26:15 EDT Ventricular Rate:  93 PR Interval:  166 QRS Duration:  86 QT Interval:  386 QTC Calculation: 479 R Axis:   77  Text Interpretation: Normal sinus rhythm Left ventricular hypertrophy with repolarization abnormality ( Sokolow-Lyon , Cornell product , Romhilt-Estes ) Abnormal ECG When compared with ECG of 21-Dec-2022 14:36, PREVIOUS ECG IS PRESENT Confirmed by Ross Marcus ([id number]) on 01/12/2023 5:33:39 AM  Radiology DG Chest 2 View  Result Date: 01/12/2023 CLINICAL DATA:  Shortness of breath, body aches and cocaine usage. EXAM: CHEST - 2 VIEW COMPARISON:  12/21/2022 FINDINGS: Enlargement of the cardiac silhouette. No pleural fluid, interstitial edema or airspace consolidation. Visualized osseous structures are unremarkable. IMPRESSION: 1. No acute cardiopulmonary disease. 2. Cardiomegaly. Electronically Signed   By: Signa Kell M.D.   On: 01/12/2023 05:14    Procedures Procedures    Medications Ordered in ED Medications  furosemide (LASIX) injection 40 mg (40 mg Intravenous Given 01/12/23 0526)    ED Course/ Medical Decision Making/ A&P                                 Medical Decision Making Amount and/or Complexity of Data  Reviewed Labs: ordered. Radiology: ordered and independent interpretation performed. ECG/medicine tests: ordered and independent interpretation performed.  Risk Prescription drug management.   52 year old male presenting with generalized bodyaches and "pain all over".  He does admit to using cocaine about 1 hour prior to arrival, history of same.  He reports productive cough and SOB.  Hx of CHF.  No fevers.  Afebrile, non-toxic.  He is sleeping on stretcher, awoken for formal exam.  Lungs CTAB, no acute distress noted.  EKG  without acute ischemic changes.  Labs as above--no leukocytosis.  No significant electrolyte derangement.  Creatinine is 1.36 which appears baseline compared with prior.  BNP 1327, has been much higher previously.  Trop 91-- appears these are chronically elevated, similar to prior.  CXR without acute findings.  Given dose of lasix here.  Will obtain delta trop to ensure not drastically increasing.  6:23 AM Delta trop down trending at 83.  His vitals remain stable, not requiring supplemental O2 currently.  He has started to diurese here in the ED.  Do not feel he needs admission at this time.    Patient strongly advised to discontinue cocaine use.  He will need to take his medications (notoriously non-compliant).  Encouraged to follow-up with PCP.  Return here for any new/acute changes.  Final Clinical Impression(s) / ED Diagnoses Final diagnoses:  Body aches  Shortness of breath    Rx / DC Orders ED Discharge Orders     None         Garlon Hatchet, PA-C 01/12/23 7829    Shon Baton, MD 01/16/23 [phone]

## 2023-01-12 NOTE — ED Triage Notes (Signed)
Pt BIB EMS with c/o generalized body aches. Pt does cocaine everyday, last time pt used was around 2:15. Pt denies CP or SOB.     174/100 HR 108

## 2023-01-12 NOTE — ED Notes (Signed)
..  The patient is A&OX4, ambulatory at d/c with independent steady gait, NAD. Pt verbalized understanding of d/c instructions and follow up care. Pt given crackers and soda.

## 2023-01-12 NOTE — ED Provider Notes (Signed)
  [place] EMERGENCY DEPARTMENT AT [facility] Provider Note   CSN: [id number] Arrival date & time: 01/12/23  1655     History {Add pertinent medical, surgical, social history, OB history to HPI:1} Chief Complaint  Patient presents with   Generalized Body Aches    Charles [name] is a 52 y.o. male.  HPI     Home Medications Prior to Admission medications   Medication Sig Start Date End Date Taking? Authorizing Provider  diclofenac Sodium (VOLTAREN) 1 % GEL Apply 2 g topically 4 (four) times daily. 12/03/22   Jeannie Fend, PA-C  empagliflozin (JARDIANCE) 10 MG TABS tablet Take 1 tablet (10 mg total) by mouth daily before breakfast. 12/21/22   Al Decant, PA-C  furosemide (LASIX) 40 MG tablet Take 1 tablet (40 mg total) by mouth daily. 12/21/22   Al Decant, PA-C  losartan (COZAAR) 100 MG tablet Take 1 tablet (100 mg total) by mouth daily. 12/21/22   Al Decant, PA-C  mupirocin ointment (BACTROBAN) 2 % Apply 1 Application topically 2 (two) times daily. 11/27/22   Alicia Amel, MD  ondansetron (ZOFRAN) 4 MG tablet Take 1 tablet (4 mg total) by mouth every 6 (six) hours. 12/16/22   Rexford Maus, DO  spironolactone (ALDACTONE) 25 MG tablet Take 1 tablet (25 mg total) by mouth daily. 11/28/22   Alicia Amel, MD      Allergies    Egg-derived products, Banana, and Pork-derived products    Review of Systems   Review of Systems  Physical Exam Updated Vital Signs BP (!) 156/108 (BP Location: Right Arm)   Pulse 87   Temp 97.7 F (36.5 C)   Resp 18   Ht 5\' 6"  (1.676 m)   Wt 68 kg   SpO2 96%   BMI 24.21 kg/m  Physical Exam  ED Results / Procedures / Treatments   Labs (all labs ordered are listed, but only abnormal results are displayed) Labs Reviewed - No data to display  EKG None  Radiology DG Chest 2 View  Result Date: 01/12/2023 CLINICAL DATA:  Shortness of breath, body aches and cocaine usage. EXAM: CHEST - 2 VIEW  COMPARISON:  12/21/2022 FINDINGS: Enlargement of the cardiac silhouette. No pleural fluid, interstitial edema or airspace consolidation. Visualized osseous structures are unremarkable. IMPRESSION: 1. No acute cardiopulmonary disease. 2. Cardiomegaly. Electronically Signed   By: Signa Kell M.D.   On: 01/12/2023 05:14    Procedures Procedures  {Document cardiac monitor, telemetry assessment procedure when appropriate:1}  Medications Ordered in ED Medications - No data to display  ED Course/ Medical Decision Making/ A&P   {   Click here for ABCD2, HEART and other calculatorsREFRESH Note before signing :1}                              Medical Decision Making  ***  {Document critical care time when appropriate:1} {Document review of labs and clinical decision tools ie heart score, Chads2Vasc2 etc:1}  {Document your independent review of radiology images, and any outside records:1} {Document your discussion with family members, caretakers, and with consultants:1} {Document social determinants of health affecting pt's care:1} {Document your decision making why or why not admission, treatments were needed:1} Final Clinical Impression(s) / ED Diagnoses Final diagnoses:  None    Rx / DC Orders ED Discharge Orders     None

## 2023-01-12 NOTE — Discharge Instructions (Signed)
Stop using cocaine, this is bad for your heart. Take your medications as prescribed. Follow-up with your doctor. Return here for new concerns.

## 2023-01-13 ENCOUNTER — Encounter (HOSPITAL_COMMUNITY): Payer: Self-pay

## 2023-01-13 ENCOUNTER — Emergency Department (HOSPITAL_COMMUNITY)
Admission: EM | Admit: 2023-01-13 | Discharge: 2023-01-13 | Disposition: A | Discharge status: Home / Self Care | Payer: Medicare HMO | Attending: Emergency Medicine | Admitting: Emergency Medicine

## 2023-01-13 ENCOUNTER — Emergency Department (HOSPITAL_COMMUNITY): Payer: Medicare HMO

## 2023-01-13 DIAGNOSIS — R0602 Shortness of breath: Secondary | ICD-10-CM | POA: Diagnosis not present

## 2023-01-13 DIAGNOSIS — R0789 Other chest pain: Secondary | ICD-10-CM | POA: Insufficient documentation

## 2023-01-13 DIAGNOSIS — R42 Dizziness and giddiness: Secondary | ICD-10-CM | POA: Insufficient documentation

## 2023-01-13 DIAGNOSIS — I517 Cardiomegaly: Secondary | ICD-10-CM | POA: Diagnosis not present

## 2023-01-13 DIAGNOSIS — R079 Chest pain, unspecified: Secondary | ICD-10-CM

## 2023-01-13 DIAGNOSIS — R002 Palpitations: Secondary | ICD-10-CM | POA: Insufficient documentation

## 2023-01-13 LAB — BASIC METABOLIC PANEL
Anion gap: 11 (ref 5–15)
BUN: 17 mg/dL (ref 6–20)
CO2: 26 mmol/L (ref 22–32)
Calcium: 8.9 mg/dL (ref 8.9–10.3)
Chloride: 102 mmol/L (ref 98–111)
Creatinine, Ser: 1.31 mg/dL — ABNORMAL HIGH (ref 0.61–1.24)
GFR, Estimated: >60 mL/min (ref >60)
Glucose, Bld: 92 mg/dL (ref 70–99)
Potassium: 3.9 mmol/L (ref 3.5–5.1)
Sodium: 139 mmol/L (ref 135–145)

## 2023-01-13 LAB — CBC
HCT: 41.6 % (ref 39.0–52.0)
Hemoglobin: 13.8 g/dL (ref 13.0–17.0)
MCH: 33.9 pg (ref 26.0–34.0)
MCHC: 33.2 g/dL (ref 30.0–36.0)
MCV: 102.2 fL — ABNORMAL HIGH (ref 80.0–100.0)
Platelets: 154 10*3/uL (ref 150–400)
RBC: 4.07 MIL/uL — ABNORMAL LOW (ref 4.22–5.81)
RDW: 16.4 % — ABNORMAL HIGH (ref 11.5–15.5)
WBC: 2.7 10*3/uL — ABNORMAL LOW (ref 4.0–10.5)
nRBC: 0 % (ref 0.0–0.2)

## 2023-01-13 LAB — TROPONIN I (HIGH SENSITIVITY)
Troponin I (High Sensitivity): 64 ng/L — ABNORMAL HIGH (ref <18)
Troponin I (High Sensitivity): 65 ng/L — ABNORMAL HIGH (ref <18)

## 2023-01-13 NOTE — ED Triage Notes (Signed)
Pt arrives with c/o SOB and dizziness that has been going on for 3 days. Pt has been seen the last 2 days for the same. Per pt, he will keep coming back until we fix him. Per pt, he doesn't have his BP meds to take.

## 2023-01-13 NOTE — ED Provider Notes (Signed)
[place] EMERGENCY DEPARTMENT AT [facility] Provider Note   CSN: [id number] Arrival date & time: 01/13/23  1555     History Chief Complaint  Patient presents with   Shortness of Breath    HPI Charles [name] is a 52 y.o. male presenting for recurrent lightheadedness and dizziness. This is his sixth emergency department visit within a month. States that he used cocaine 2 days ago and has had palpitations and lightheadedness intermittently. Denies fevers chills nausea vomiting shortness of breath.  Has had a 2-hour wait in emergency room states most of her symptoms are resolved but feels that he would feel better if he had p.o. intake.  He is ambulatory and no acute distress otherwise. Prior visits were for palpitations after crack use. Yesterday was for generalized muscle aches now resolved.  States today he is back for palpitations again.   Patient's recorded medical, surgical, social, medication list and allergies were reviewed in the Snapshot window as part of the initial history.   Review of Systems   Review of Systems  Constitutional:  Negative for chills and fever.  HENT:  Negative for ear pain and sore throat.   Eyes:  Negative for pain and visual disturbance.  Respiratory:  Negative for cough and shortness of breath.   Cardiovascular:  Positive for palpitations. Negative for chest pain.  Gastrointestinal:  Negative for abdominal pain and vomiting.  Genitourinary:  Negative for dysuria and hematuria.  Musculoskeletal:  Negative for arthralgias and back pain.  Skin:  Negative for color change and rash.  Neurological:  Positive for light-headedness. Negative for seizures and syncope.  All other systems reviewed and are negative.   Physical Exam Updated Vital Signs BP (!) 154/119 (BP Location: Right Arm)   Pulse 84   Temp 97.7 F (36.5 C) (Oral)   Resp (!) 22   Ht 5\' 6"  (1.676 m)   Wt 68 kg   SpO2 99%   BMI 24.21 kg/m  Physical Exam Vitals and  nursing note reviewed.  Constitutional:      General: He is not in acute distress.    Appearance: He is well-developed.  HENT:     Head: Normocephalic and atraumatic.  Eyes:     Conjunctiva/sclera: Conjunctivae normal.  Cardiovascular:     Rate and Rhythm: Normal rate and regular rhythm.     Heart sounds: No murmur heard. Pulmonary:     Effort: Pulmonary effort is normal. No respiratory distress.     Breath sounds: Normal breath sounds.  Abdominal:     Palpations: Abdomen is soft.     Tenderness: There is no abdominal tenderness.  Musculoskeletal:        General: No swelling.     Cervical back: Neck supple.  Skin:    General: Skin is warm and dry.     Capillary Refill: Capillary refill takes less than 2 seconds.  Neurological:     Mental Status: He is alert.  Psychiatric:        Mood and Affect: Mood normal.      ED Course/ Medical Decision Making/ A&P    Procedures Procedures   Medications Ordered in ED Medications - No data to display Medical Decision Making: Charles [name] is a 52 y.o. male who presented to the ED today with chest pain, detailed above.  Based on patient's comorbidities, patient has a heart score of 4.    Patient placed on continuous vitals and telemetry monitoring while in ED which  was reviewed periodically.  Complete initial physical exam performed, notably the patient was hemodynamically stable in no acute distress..   Reviewed and confirmed nursing documentation for past medical history, family history, social history.    Initial Assessment: With the patient's presentation of left-sided chest pain, most likely diagnosis is musculoskeletal chest pain versus GERD, although ACS remains on the differential. Other diagnoses were considered including (but not limited to) pulmonary embolism, community-acquired pneumonia, aortic dissection, pneumothorax, underlying bony abnormality, anemia. These are considered less likely due to history of present  illness and physical exam findings.    In particular, concerning pulmonary embolism: Patient is PERC positive for age and the they deny malignancy, recent surgery, history of DVT, or calf tenderness leading to a low risk Wells score. Aortic Dissection also reconsidered but seems less likely based on the location, quality, onset, and severity of symptoms in this case.  Patient also has a lack of underlying history of AD or TAA.  This is most consistent with an acute life/limb threatening illness complicated by underlying chronic conditions.   Initial Plan: Evaluate for ACS with delta troponin (given multiple troponins over the past 72 hours) and EKG evaluated as below  Evaluate for dissection, bony abnormality, or pneumonia with chest x-ray and screening laboratory evaluation including CBC, BMP  Further evaluation for pulmonary embolism not indicated at this time based on patient's PERC and Wells score.  Further evaluation for Thoracic Aortic Dissection not indicated at this time based on patient's clinical history and PE findings.   Initial Study Results: EKG was reviewed independently. Rate, rhythm, axis, intervals all examined and without medically relevant abnormality. ST segments without concerns for elevations.    Laboratory  Serial troponin demonstrated no acute pathology  CBC and BMP without obvious metabolic or inflammatory abnormalities requiring further evaluation   Radiology  DG Chest 2 View  Result Date: 01/12/2023 CLINICAL DATA:  Shortness of breath, body aches and cocaine usage. EXAM: CHEST - 2 VIEW COMPARISON:  12/21/2022 FINDINGS: Enlargement of the cardiac silhouette. No pleural fluid, interstitial edema or airspace consolidation. Visualized osseous structures are unremarkable. IMPRESSION: 1. No acute cardiopulmonary disease. 2. Cardiomegaly. Electronically Signed   By: Signa Kell M.D.   On: 01/12/2023 05:14   DG Chest Portable 1 View  Result Date: 12/21/2022 CLINICAL  DATA:  Fatigue, shortness of breath EXAM: PORTABLE CHEST 1 VIEW COMPARISON:  Chest radiograph 12/16/2022 FINDINGS: The heart is enlarged, unchanged. The upper mediastinal contours are normal There is no focal consolidation or pulmonary edema. There is no pleural effusion or pneumothorax There is no acute osseous abnormality. Left upper quadrant vascular coils are noted. IMPRESSION: Unchanged cardiomegaly. No radiographic evidence of acute cardiopulmonary pathology. Electronically Signed   By: Lesia Hausen M.D.   On: 12/21/2022 15:18   DG Chest 1 View  Result Date: 12/16/2022 CLINICAL DATA:  141880 SOB (shortness of breath) 141880 EXAM: CHEST  1 VIEW COMPARISON:  Chest x-ray 11/26/2022. FINDINGS: The heart and mediastinal contours are within normal limits. No focal consolidation. No pulmonary edema. No pleural effusion. No pneumothorax. No acute osseous abnormality. Left upper quadrant metallic density suggests vascular coiling. IMPRESSION: No active disease. Electronically Signed   By: Tish Frederickson M.D.   On: 12/16/2022 16:54    Final Assessment and Plan: Reassessed after 5 hours in emergency room.  Hemodynamically stable.  Troponin chronically elevated from his continued cocaine use.  No other focal pathology detected patient stable for outpatient care management.  Clinical Impression: No diagnosis found.   Data Unavailable   Final Clinical Impression(s) / ED Diagnoses Final diagnoses:  None    Rx / DC Orders ED Discharge Orders     None         Glyn Ade, MD 01/13/23 2227

## 2023-01-22 ENCOUNTER — Encounter (HOSPITAL_COMMUNITY): Payer: Self-pay | Admitting: Emergency Medicine

## 2023-01-22 ENCOUNTER — Other Ambulatory Visit (HOSPITAL_COMMUNITY): Payer: Self-pay

## 2023-01-22 ENCOUNTER — Emergency Department (HOSPITAL_COMMUNITY)
Admission: EM | Admit: 2023-01-22 | Discharge: 2023-01-22 | Disposition: A | Discharge status: Home / Self Care | Payer: Medicare HMO | Attending: Emergency Medicine | Admitting: Emergency Medicine

## 2023-01-22 DIAGNOSIS — Z79899 Other long term (current) drug therapy: Secondary | ICD-10-CM | POA: Insufficient documentation

## 2023-01-22 DIAGNOSIS — R531 Weakness: Secondary | ICD-10-CM | POA: Diagnosis not present

## 2023-01-22 DIAGNOSIS — Z5900 Homelessness unspecified: Secondary | ICD-10-CM | POA: Insufficient documentation

## 2023-01-22 DIAGNOSIS — I10 Essential (primary) hypertension: Secondary | ICD-10-CM | POA: Diagnosis not present

## 2023-01-22 DIAGNOSIS — Z760 Encounter for issue of repeat prescription: Secondary | ICD-10-CM | POA: Insufficient documentation

## 2023-01-22 DIAGNOSIS — M25551 Pain in right hip: Secondary | ICD-10-CM | POA: Insufficient documentation

## 2023-01-22 DIAGNOSIS — Z789 Other specified health status: Secondary | ICD-10-CM

## 2023-01-22 DIAGNOSIS — R0602 Shortness of breath: Secondary | ICD-10-CM | POA: Diagnosis not present

## 2023-01-22 LAB — BASIC METABOLIC PANEL
Anion gap: 7 (ref 5–15)
BUN: 14 mg/dL (ref 6–20)
CO2: 26 mmol/L (ref 22–32)
Calcium: 8.8 mg/dL — ABNORMAL LOW (ref 8.9–10.3)
Chloride: 107 mmol/L (ref 98–111)
Creatinine, Ser: 1.51 mg/dL — ABNORMAL HIGH (ref 0.61–1.24)
GFR, Estimated: 56 mL/min — ABNORMAL LOW (ref >60)
Glucose, Bld: 121 mg/dL — ABNORMAL HIGH (ref 70–99)
Potassium: 4.4 mmol/L (ref 3.5–5.1)
Sodium: 140 mmol/L (ref 135–145)

## 2023-01-22 LAB — CBC
HCT: 40.4 % (ref 39.0–52.0)
Hemoglobin: 13.2 g/dL (ref 13.0–17.0)
MCH: 34.3 pg — ABNORMAL HIGH (ref 26.0–34.0)
MCHC: 32.7 g/dL (ref 30.0–36.0)
MCV: 104.9 fL — ABNORMAL HIGH (ref 80.0–100.0)
Platelets: 128 10*3/uL — ABNORMAL LOW (ref 150–400)
RBC: 3.85 MIL/uL — ABNORMAL LOW (ref 4.22–5.81)
RDW: 16.9 % — ABNORMAL HIGH (ref 11.5–15.5)
WBC: 3 10*3/uL — ABNORMAL LOW (ref 4.0–10.5)
nRBC: 0 % (ref 0.0–0.2)

## 2023-01-22 MED ORDER — EMPAGLIFLOZIN 10 MG PO TABS
10.0000 mg | ORAL_TABLET | Freq: Every day | ORAL | 1 refills | Status: DC
  Filled 2023-01-22: qty 30, 30d supply, fill #0

## 2023-01-22 MED ORDER — LOSARTAN POTASSIUM 100 MG PO TABS
100.0000 mg | ORAL_TABLET | Freq: Every day | ORAL | 1 refills | Status: DC
  Filled 2023-01-22: qty 30, 30d supply, fill #0

## 2023-01-22 MED ORDER — FUROSEMIDE 40 MG PO TABS
40.0000 mg | ORAL_TABLET | Freq: Every day | ORAL | 1 refills | Status: DC
  Filled 2023-01-22: qty 30, 30d supply, fill #0

## 2023-01-22 NOTE — Discharge Instructions (Signed)
Your labs today have remained stable You do not appear to have any acute medical condition Your home medications [facility]. Please follow-up with your doctor.

## 2023-01-22 NOTE — ED Notes (Signed)
Pt refused to change into gown and be hooked up to monitor. Pt asked for ice and food and was told he had to wait until the doctor saw him.

## 2023-01-22 NOTE — ED Notes (Signed)
Pt refused vital signs.

## 2023-01-22 NOTE — Discharge Planning (Signed)
RNCM consulted in regards to medication assistance.  Pt has Medicare insurance coverage and is not eligible for Medication Assistance Through [facility]) program.  RNCM suggests sending Rx to [facility] to fill and have brought to him prior to discharge.  No further CM needs communicated at this time.

## 2023-01-22 NOTE — ED Notes (Addendum)
Pt did not want EKG, stating "I'm not here for shortness of breath, I'm here because my head is spinning". Pt adamant about not wanting EKG.

## 2023-01-22 NOTE — ED Triage Notes (Signed)
Pt reports here is here for head spinning and tired since Tuesday. States he woke up today, ate something and still feels bad. 10/10 right hip pain.

## 2023-01-22 NOTE — ED Provider Notes (Signed)
[place] EMERGENCY DEPARTMENT AT [facility] Provider Note   CSN: [id number] Arrival date & time: 01/22/23  1215     History  Chief Complaint  Patient presents with   Weakness    Charles [name] is a 52 y.o. male.  HPI 52 year old male presents today complaining of not sleeping well and tired since Tuesday.  He states he has had decreased access to food due to being homeless.  He has also not been getting his medications filled.  He states he has pain in his right hip which has been chronic and he has been told it is arthritis.  He has not had any recent trauma.     Home Medications Prior to Admission medications   Medication Sig Start Date End Date Taking? Authorizing Provider  diclofenac Sodium (VOLTAREN) 1 % GEL Apply 2 g topically 4 (four) times daily. 12/03/22   Jeannie Fend, PA-C  empagliflozin (JARDIANCE) 10 MG TABS tablet Take 1 tablet (10 mg total) by mouth daily before breakfast. 01/22/23   Margarita Grizzle, MD  furosemide (LASIX) 40 MG tablet Take 1 tablet (40 mg total) by mouth daily. 01/22/23   Margarita Grizzle, MD  losartan (COZAAR) 100 MG tablet Take 1 tablet (100 mg total) by mouth daily. 01/22/23   Margarita Grizzle, MD  mupirocin ointment (BACTROBAN) 2 % Apply 1 Application topically 2 (two) times daily. 11/27/22   Alicia Amel, MD  ondansetron (ZOFRAN) 4 MG tablet Take 1 tablet (4 mg total) by mouth every 6 (six) hours. 12/16/22   Rexford Maus, DO  spironolactone (ALDACTONE) 25 MG tablet Take 1 tablet (25 mg total) by mouth daily. 11/28/22   Alicia Amel, MD      Allergies    Egg-derived products, Banana, and Pork-derived products    Review of Systems   Review of Systems  Physical Exam Updated Vital Signs BP (!) 169/110 (BP Location: Right Arm)   Pulse 83   Temp (!) 97.3 F (36.3 C) (Oral)   Resp 16   Ht 1.676 m (5\' 6" )   Wt 68 kg   SpO2 100%   BMI 24.21 kg/m  Physical Exam Vitals and nursing note reviewed.  HENT:     Head:  Normocephalic.     Right Ear: External ear normal.     Left Ear: External ear normal.     Nose: Nose normal.     Mouth/Throat:     Pharynx: Oropharynx is clear.  Eyes:     Extraocular Movements: Extraocular movements intact.     Pupils: Pupils are equal, round, and reactive to light.  Cardiovascular:     Rate and Rhythm: Normal rate and regular rhythm.     Pulses: Normal pulses.  Pulmonary:     Effort: Pulmonary effort is normal.  Abdominal:     General: Abdomen is flat.  Musculoskeletal:     Cervical back: Normal range of motion.     Comments: Right hip examined no signs of trauma, redness, warmth Full active range of motion Pulses intact  Skin:    General: Skin is warm.     Capillary Refill: Capillary refill takes less than 2 seconds.  Neurological:     General: No focal deficit present.     Mental Status: He is alert.  Psychiatric:        Mood and Affect: Mood normal.     ED Results / Procedures / Treatments   Labs (all labs ordered are listed, but  only abnormal results are displayed) Labs Reviewed  CBC - Abnormal; Notable for the following components:      Result Value   WBC 3.0 (*)    RBC 3.85 (*)    MCV 104.9 (*)    MCH 34.3 (*)    RDW 16.9 (*)    Platelets 128 (*)    All other components within normal limits  BASIC METABOLIC PANEL - Abnormal; Notable for the following components:   Glucose, Bld 121 (*)    Creatinine, Ser 1.51 (*)    Calcium 8.8 (*)    GFR, Estimated 56 (*)    All other components within normal limits    EKG None  Radiology No results found.  Procedures Procedures    Medications Ordered in ED Medications - No data to display  ED Course/ Medical Decision Making/ A&P                                 Medical Decision Making Amount and/or Complexity of Data Reviewed Labs: ordered.  Risk Prescription drug management.   52 year old male presents today complaining of generalized lysed.  Also states that he has not been  eating well.  He has not been able to take his medicines due to access issues.  We have arranged with TOC to have his medications sent to [facility] pharmacy. Labs reviewed and appear to be stable for him.  Hypertensive but has not been taking his medications. Hip appears stable with chronic DJD. He is given something to eat here. Not appear to be any acute abnormalities or problems today.  Patient appears to be stable for discharge        Final Clinical Impression(s) / ED Diagnoses Final diagnoses:  Weakness  Has run out of medications    Rx / DC Orders ED Discharge Orders          Ordered    empagliflozin (JARDIANCE) 10 MG TABS tablet  Daily before breakfast        01/22/23 1456    furosemide (LASIX) 40 MG tablet  Daily        01/22/23 1456    losartan (COZAAR) 100 MG tablet  Daily        01/22/23 1456              Margarita Grizzle, MD 01/22/23 1625

## 2023-01-27 ENCOUNTER — Observation Stay (HOSPITAL_COMMUNITY): Payer: Medicare HMO

## 2023-01-27 ENCOUNTER — Emergency Department (HOSPITAL_COMMUNITY): Payer: Medicare HMO

## 2023-01-27 ENCOUNTER — Inpatient Hospital Stay (HOSPITAL_COMMUNITY)
Admission: EM | Admit: 2023-01-27 | Discharge: 2023-01-31 | DRG: 291 | Disposition: A | Discharge status: Home / Self Care | Payer: Medicare HMO | Attending: Internal Medicine | Admitting: Internal Medicine

## 2023-01-27 DIAGNOSIS — I509 Heart failure, unspecified: Principal | ICD-10-CM

## 2023-01-27 DIAGNOSIS — I444 Left anterior fascicular block: Secondary | ICD-10-CM | POA: Diagnosis present

## 2023-01-27 DIAGNOSIS — Z1152 Encounter for screening for COVID-19: Secondary | ICD-10-CM

## 2023-01-27 DIAGNOSIS — R140 Abdominal distension (gaseous): Secondary | ICD-10-CM | POA: Diagnosis not present

## 2023-01-27 DIAGNOSIS — Z91014 Allergy to mammalian meats: Secondary | ICD-10-CM

## 2023-01-27 DIAGNOSIS — Z5900 Homelessness unspecified: Secondary | ICD-10-CM

## 2023-01-27 DIAGNOSIS — Z800 Family history of malignant neoplasm of digestive organs: Secondary | ICD-10-CM | POA: Diagnosis not present

## 2023-01-27 DIAGNOSIS — B182 Chronic viral hepatitis C: Secondary | ICD-10-CM | POA: Diagnosis present

## 2023-01-27 DIAGNOSIS — F1410 Cocaine abuse, uncomplicated: Secondary | ICD-10-CM

## 2023-01-27 DIAGNOSIS — R7989 Other specified abnormal findings of blood chemistry: Secondary | ICD-10-CM | POA: Diagnosis not present

## 2023-01-27 DIAGNOSIS — Z720 Tobacco use: Secondary | ICD-10-CM

## 2023-01-27 DIAGNOSIS — I5023 Acute on chronic systolic (congestive) heart failure: Secondary | ICD-10-CM | POA: Diagnosis not present

## 2023-01-27 DIAGNOSIS — M25551 Pain in right hip: Secondary | ICD-10-CM | POA: Diagnosis present

## 2023-01-27 DIAGNOSIS — N179 Acute kidney failure, unspecified: Secondary | ICD-10-CM | POA: Diagnosis not present

## 2023-01-27 DIAGNOSIS — Z801 Family history of malignant neoplasm of trachea, bronchus and lung: Secondary | ICD-10-CM

## 2023-01-27 DIAGNOSIS — J449 Chronic obstructive pulmonary disease, unspecified: Secondary | ICD-10-CM | POA: Diagnosis present

## 2023-01-27 DIAGNOSIS — I499 Cardiac arrhythmia, unspecified: Secondary | ICD-10-CM | POA: Diagnosis not present

## 2023-01-27 DIAGNOSIS — R911 Solitary pulmonary nodule: Secondary | ICD-10-CM | POA: Diagnosis not present

## 2023-01-27 DIAGNOSIS — F17210 Nicotine dependence, cigarettes, uncomplicated: Secondary | ICD-10-CM | POA: Diagnosis present

## 2023-01-27 DIAGNOSIS — I130 Hypertensive heart and chronic kidney disease with heart failure and stage 1 through stage 4 chronic kidney disease, or unspecified chronic kidney disease: Secondary | ICD-10-CM | POA: Diagnosis not present

## 2023-01-27 DIAGNOSIS — D696 Thrombocytopenia, unspecified: Secondary | ICD-10-CM | POA: Diagnosis not present

## 2023-01-27 DIAGNOSIS — R0602 Shortness of breath: Secondary | ICD-10-CM | POA: Diagnosis not present

## 2023-01-27 DIAGNOSIS — Z7984 Long term (current) use of oral hypoglycemic drugs: Secondary | ICD-10-CM | POA: Diagnosis not present

## 2023-01-27 DIAGNOSIS — I110 Hypertensive heart disease with heart failure: Secondary | ICD-10-CM | POA: Diagnosis not present

## 2023-01-27 DIAGNOSIS — N182 Chronic kidney disease, stage 2 (mild): Secondary | ICD-10-CM | POA: Diagnosis present

## 2023-01-27 DIAGNOSIS — R9431 Abnormal electrocardiogram [ECG] [EKG]: Secondary | ICD-10-CM | POA: Diagnosis present

## 2023-01-27 DIAGNOSIS — E871 Hypo-osmolality and hyponatremia: Secondary | ICD-10-CM | POA: Diagnosis present

## 2023-01-27 DIAGNOSIS — J42 Unspecified chronic bronchitis: Secondary | ICD-10-CM | POA: Diagnosis not present

## 2023-01-27 DIAGNOSIS — I10 Essential (primary) hypertension: Secondary | ICD-10-CM | POA: Diagnosis not present

## 2023-01-27 DIAGNOSIS — F17200 Nicotine dependence, unspecified, uncomplicated: Secondary | ICD-10-CM

## 2023-01-27 DIAGNOSIS — I5021 Acute systolic (congestive) heart failure: Secondary | ICD-10-CM | POA: Diagnosis not present

## 2023-01-27 DIAGNOSIS — I517 Cardiomegaly: Secondary | ICD-10-CM | POA: Diagnosis not present

## 2023-01-27 DIAGNOSIS — J439 Emphysema, unspecified: Secondary | ICD-10-CM | POA: Diagnosis present

## 2023-01-27 DIAGNOSIS — F1910 Other psychoactive substance abuse, uncomplicated: Secondary | ICD-10-CM | POA: Diagnosis not present

## 2023-01-27 LAB — URINALYSIS, COMPLETE (UACMP) WITH MICROSCOPIC
Bacteria, UA: NONE SEEN
Bilirubin Urine: NEGATIVE
Glucose, UA: NEGATIVE mg/dL
Hgb urine dipstick: NEGATIVE
Ketones, ur: NEGATIVE mg/dL
Leukocytes,Ua: NEGATIVE
Nitrite: NEGATIVE
Protein, ur: NEGATIVE mg/dL
Specific Gravity, Urine: 1.02 (ref 1.005–1.030)
pH: 5 (ref 5.0–8.0)

## 2023-01-27 LAB — RAPID URINE DRUG SCREEN, HOSP PERFORMED
Amphetamines: NOT DETECTED
Barbiturates: NOT DETECTED
Benzodiazepines: NOT DETECTED
Cocaine: POSITIVE — AB
Opiates: NOT DETECTED
Tetrahydrocannabinol: NOT DETECTED

## 2023-01-27 LAB — CBC WITH DIFFERENTIAL/PLATELET
Abs Immature Granulocytes: 0.01 10*3/uL (ref 0.00–0.07)
Basophils Absolute: 0 10*3/uL (ref 0.0–0.1)
Basophils Relative: 0 %
Eosinophils Absolute: 0 10*3/uL (ref 0.0–0.5)
Eosinophils Relative: 1 %
HCT: 44.6 % (ref 39.0–52.0)
Hemoglobin: 14.9 g/dL (ref 13.0–17.0)
Immature Granulocytes: 0 %
Lymphocytes Relative: 25 %
Lymphs Abs: 0.7 10*3/uL (ref 0.7–4.0)
MCH: 35.6 pg — ABNORMAL HIGH (ref 26.0–34.0)
MCHC: 33.4 g/dL (ref 30.0–36.0)
MCV: 106.4 fL — ABNORMAL HIGH (ref 80.0–100.0)
Monocytes Absolute: 0.4 10*3/uL (ref 0.1–1.0)
Monocytes Relative: 13 %
Neutro Abs: 1.8 10*3/uL (ref 1.7–7.7)
Neutrophils Relative %: 61 %
Platelets: 132 10*3/uL — ABNORMAL LOW (ref 150–400)
RBC: 4.19 MIL/uL — ABNORMAL LOW (ref 4.22–5.81)
RDW: 16.9 % — ABNORMAL HIGH (ref 11.5–15.5)
WBC: 3 10*3/uL — ABNORMAL LOW (ref 4.0–10.5)
nRBC: 0 % (ref 0.0–0.2)

## 2023-01-27 LAB — I-STAT VENOUS BLOOD GAS, ED
Acid-Base Excess: 4 mmol/L — ABNORMAL HIGH (ref 0.0–2.0)
Bicarbonate: 28.3 mmol/L — ABNORMAL HIGH (ref 20.0–28.0)
Calcium, Ion: 1.11 mmol/L — ABNORMAL LOW (ref 1.15–1.40)
HCT: 44 % (ref 39.0–52.0)
Hemoglobin: 15 g/dL (ref 13.0–17.0)
O2 Saturation: 87 %
Potassium: 3.9 mmol/L (ref 3.5–5.1)
Sodium: 140 mmol/L (ref 135–145)
TCO2: 30 mmol/L (ref 22–32)
pCO2, Ven: 41.1 mmHg — ABNORMAL LOW (ref 44–60)
pH, Ven: 7.446 — ABNORMAL HIGH (ref 7.25–7.43)
pO2, Ven: 51 mmHg — ABNORMAL HIGH (ref 32–45)

## 2023-01-27 LAB — COMPREHENSIVE METABOLIC PANEL
ALT: 21 U/L (ref 0–44)
AST: 32 U/L (ref 15–41)
Albumin: 3.4 g/dL — ABNORMAL LOW (ref 3.5–5.0)
Alkaline Phosphatase: 71 U/L (ref 38–126)
Anion gap: 13 (ref 5–15)
BUN: 22 mg/dL — ABNORMAL HIGH (ref 6–20)
CO2: 20 mmol/L — ABNORMAL LOW (ref 22–32)
Calcium: 8.9 mg/dL (ref 8.9–10.3)
Chloride: 105 mmol/L (ref 98–111)
Creatinine, Ser: 1.37 mg/dL — ABNORMAL HIGH (ref 0.61–1.24)
GFR, Estimated: >60 mL/min (ref >60)
Glucose, Bld: 117 mg/dL — ABNORMAL HIGH (ref 70–99)
Potassium: 4.6 mmol/L (ref 3.5–5.1)
Sodium: 138 mmol/L (ref 135–145)
Total Bilirubin: 1.5 mg/dL — ABNORMAL HIGH (ref 0.3–1.2)
Total Protein: 7.4 g/dL (ref 6.5–8.1)

## 2023-01-27 LAB — OSMOLALITY: Osmolality: 297 mOsm/kg — ABNORMAL HIGH (ref 275–295)

## 2023-01-27 LAB — CK: Total CK: 328 U/L (ref 49–397)

## 2023-01-27 LAB — SARS CORONAVIRUS 2 BY RT PCR: SARS Coronavirus 2 by RT PCR: NEGATIVE

## 2023-01-27 LAB — TSH: TSH: 0.428 u[IU]/mL (ref 0.350–4.500)

## 2023-01-27 LAB — BRAIN NATRIURETIC PEPTIDE: B Natriuretic Peptide: 1887 pg/mL — ABNORMAL HIGH (ref 0.0–100.0)

## 2023-01-27 LAB — D-DIMER, QUANTITATIVE: D-Dimer, Quant: 0.93 ug{FEU}/mL — ABNORMAL HIGH (ref 0.00–0.50)

## 2023-01-27 LAB — OSMOLALITY, URINE: Osmolality, Ur: 812 mosm/kg (ref 300–900)

## 2023-01-27 LAB — MAGNESIUM: Magnesium: 2.2 mg/dL (ref 1.7–2.4)

## 2023-01-27 LAB — TROPONIN I (HIGH SENSITIVITY)
Troponin I (High Sensitivity): 46 ng/L — ABNORMAL HIGH (ref <18)
Troponin I (High Sensitivity): 49 ng/L — ABNORMAL HIGH (ref <18)

## 2023-01-27 LAB — PROTIME-INR
INR: 1 (ref 0.8–1.2)
Prothrombin Time: 13.8 s (ref 11.4–15.2)

## 2023-01-27 LAB — I-STAT CG4 LACTIC ACID, ED: Lactic Acid, Venous: 1.8 mmol/L (ref 0.5–1.9)

## 2023-01-27 MED ORDER — IOHEXOL 350 MG/ML SOLN
75.0000 mL | Freq: Once | INTRAVENOUS | Status: AC | PRN
  Administered 2023-01-27: 75 mL via INTRAVENOUS

## 2023-01-27 MED ORDER — IPRATROPIUM-ALBUTEROL 0.5-2.5 (3) MG/3ML IN SOLN
3.0000 mL | Freq: Once | RESPIRATORY_TRACT | Status: AC
  Administered 2023-01-27: 3 mL via RESPIRATORY_TRACT
  Filled 2023-01-27: qty 3

## 2023-01-27 MED ORDER — NICOTINE 7 MG/24HR TD PT24
7.0000 mg | MEDICATED_PATCH | Freq: Every day | TRANSDERMAL | Status: DC
  Administered 2023-01-28 – 2023-01-30 (×3): 7 mg via TRANSDERMAL
  Filled 2023-01-27 (×4): qty 1

## 2023-01-27 MED ORDER — FUROSEMIDE 10 MG/ML IJ SOLN
40.0000 mg | Freq: Once | INTRAMUSCULAR | Status: AC
  Administered 2023-01-27: 40 mg via INTRAVENOUS
  Filled 2023-01-27: qty 4

## 2023-01-27 NOTE — Subjective & Objective (Signed)
Patient presented to emergency department for shortness of breath for 1 day history of known CHF has not been taking his Lasix today otherwise no chest pain no s dizziness Patient is homeless has history of chronic hepatitis C was seen in emergency department just 5 days ago.  He has been having more fatigue and tired with shortness of breath worse with activity he is supposed to prescribe 40 of Lasix a day

## 2023-01-27 NOTE — Assessment & Plan Note (Signed)
-  chronic avoid nephrotoxic medications such as NSAIDs, Vanco Zosyn combo,  avoid hypotension, continue to follow renal function

## 2023-01-27 NOTE — Assessment & Plan Note (Signed)
-  will monitor on tele avoid QT prolonging medications, rehydrate correct electrolytes

## 2023-01-27 NOTE — Assessment & Plan Note (Signed)
Check hepatitis viral load

## 2023-01-27 NOTE — Assessment & Plan Note (Signed)
Counseled regarding stopping cocaine abuse

## 2023-01-27 NOTE — ED Provider Notes (Signed)
EMERGENCY DEPARTMENT AT [facility] Provider Note   CSN: [id number] Arrival date & time: 01/27/23  1434     History  Chief Complaint  Patient presents with   Shortness of Breath    Charles [name] is a 52 y.o. male.   Shortness of Breath Associated symptoms: chest pain   Patient presents for fatigue and shortness of breath.  Medical history includes seizures, polysubstance abuse, COPD, chronic hepatitis C, homelessness, CHF, CKD, HTN, sleep apnea, arthritis, anemia.  He was seen in the ED 5 days ago for similar symptoms.  At the time, he did not have access to his medications.  Medications were arranged through [facility] pharmacy.  He states that he was taking them up until 2 days ago.  Yesterday, he was too fatigued to do anything.  He is currently homeless.  Patient fourth worsening shortness of breath with any activity.  He denies any pain currently.  He did have some chest pain while in the ED waiting room.  He is prescribed 40 mg Lasix daily.     Home Medications Prior to Admission medications   Medication Sig Start Date End Date Taking? Authorizing Provider  diclofenac Sodium (VOLTAREN) 1 % GEL Apply 2 g topically 4 (four) times daily. 12/03/22  Yes Jeannie Fend, PA-C  empagliflozin (JARDIANCE) 10 MG TABS tablet Take 1 tablet (10 mg total) by mouth daily before breakfast. 01/22/23  Yes Margarita Grizzle, MD  furosemide (LASIX) 40 MG tablet Take 1 tablet (40 mg total) by mouth daily. 01/22/23  Yes Margarita Grizzle, MD  losartan (COZAAR) 100 MG tablet Take 1 tablet (100 mg total) by mouth daily. 01/22/23  Yes Margarita Grizzle, MD  spironolactone (ALDACTONE) 25 MG tablet Take 1 tablet (25 mg total) by mouth daily. 11/28/22  Yes Alicia Amel, MD      Allergies    Egg-derived products, Banana, and Pork-derived products    Review of Systems   Review of Systems  Constitutional:  Positive for fatigue.  Respiratory:  Positive for shortness of breath.   Cardiovascular:   Positive for chest pain.  Neurological:  Positive for light-headedness.  All other systems reviewed and are negative.   Physical Exam Updated Vital Signs BP (!) 161/106 (BP Location: Right Arm)   Pulse 92   Temp 98.5 F (36.9 C) (Oral)   Resp 16   Ht 5\' 6"  (1.676 m)   Wt 68 kg   SpO2 93%   BMI 24.20 kg/m  Physical Exam Vitals and nursing note reviewed.  Constitutional:      General: He is not in acute distress.    Appearance: He is well-developed. He is not ill-appearing, toxic-appearing or diaphoretic.  HENT:     Head: Normocephalic and atraumatic.     Mouth/Throat:     Mouth: Mucous membranes are moist.  Eyes:     Conjunctiva/sclera: Conjunctivae normal.  Cardiovascular:     Rate and Rhythm: Normal rate and regular rhythm.     Heart sounds: No murmur heard. Pulmonary:     Effort: Pulmonary effort is normal. Tachypnea present. No respiratory distress.     Breath sounds: Examination of the right-lower field reveals decreased breath sounds. Examination of the left-lower field reveals decreased breath sounds. Decreased breath sounds present.  Chest:     Chest wall: No tenderness.  Abdominal:     Palpations: Abdomen is soft.     Tenderness: There is no abdominal tenderness.  Musculoskeletal:  General: No swelling. Normal range of motion.     Cervical back: Normal range of motion and neck supple.     Right lower leg: No edema.     Left lower leg: No edema.  Skin:    General: Skin is warm and dry.     Coloration: Skin is not cyanotic or pale.  Neurological:     General: No focal deficit present.     Mental Status: He is alert and oriented to person, place, and time.  Psychiatric:        Mood and Affect: Mood normal.        Behavior: Behavior normal.     ED Results / Procedures / Treatments   Labs (all labs ordered are listed, but only abnormal results are displayed) Labs Reviewed  COMPREHENSIVE METABOLIC PANEL - Abnormal; Notable for the following  components:      Result Value   CO2 20 (*)    Glucose, Bld 117 (*)    BUN 22 (*)    Creatinine, Ser 1.37 (*)    Albumin 3.4 (*)    Total Bilirubin 1.5 (*)    All other components within normal limits  CBC WITH DIFFERENTIAL/PLATELET - Abnormal; Notable for the following components:   WBC 3.0 (*)    RBC 4.19 (*)    MCV 106.4 (*)    MCH 35.6 (*)    RDW 16.9 (*)    Platelets 132 (*)    All other components within normal limits  BRAIN NATRIURETIC PEPTIDE - Abnormal; Notable for the following components:   B Natriuretic Peptide 1,887.0 (*)    All other components within normal limits  RAPID URINE DRUG SCREEN, HOSP PERFORMED - Abnormal; Notable for the following components:   Cocaine POSITIVE (*)    All other components within normal limits  OSMOLALITY - Abnormal; Notable for the following components:   Osmolality 297 (*)    All other components within normal limits  D-DIMER, QUANTITATIVE (NOT AT [facility]) - Abnormal; Notable for the following components:   D-Dimer, Quant 0.93 (*)    All other components within normal limits  I-STAT VENOUS BLOOD GAS, ED - Abnormal; Notable for the following components:   pH, Ven 7.446 (*)    pCO2, Ven 41.1 (*)    pO2, Ven 51 (*)    Bicarbonate 28.3 (*)    Acid-Base Excess 4.0 (*)    Calcium, Ion 1.11 (*)    All other components within normal limits  TROPONIN I (HIGH SENSITIVITY) - Abnormal; Notable for the following components:   Troponin I (High Sensitivity) 46 (*)    All other components within normal limits  TROPONIN I (HIGH SENSITIVITY) - Abnormal; Notable for the following components:   Troponin I (High Sensitivity) 49 (*)    All other components within normal limits  SARS CORONAVIRUS 2 BY RT PCR  MAGNESIUM  OSMOLALITY, URINE  PROTIME-INR  TSH  URINALYSIS, COMPLETE (UACMP) WITH MICROSCOPIC  CK  BLOOD GAS, VENOUS  PREALBUMIN  HEPATITIS PANEL, ACUTE  I-STAT CG4 LACTIC ACID, ED    EKG EKG Interpretation Date/Time:  Monday January 27 2023 15:21:16 EDT Ventricular Rate:  83 PR Interval:  156 QRS Duration:  76 QT Interval:  432 QTC Calculation: 507 R Axis:   -49  Text Interpretation: Normal sinus rhythm Possible Left atrial enlargement Left anterior fascicular block Left ventricular hypertrophy with repolarization abnormality ( Sokolow-Lyon , Cornell product , Romhilt-Estes ) Lateral infarct , age undetermined Prolonged QT Abnormal ECG  Confirmed by Gloris Manchester [phone]) on 01/27/2023 7:02:56 PM  Radiology DG Chest 2 View  Result Date: 01/27/2023 CLINICAL DATA:  Acute shortness of breath beginning today. EXAM: CHEST - 2 VIEW COMPARISON:  01/13/2023 FINDINGS: Stable mild to moderate cardiomegaly. Both lungs are clear. No evidence of pleural effusion. Embolization coil again noted in the left upper quadrant. IMPRESSION: Stable cardiomegaly. No active lung disease. Electronically Signed   By: Danae Orleans M.D.   On: 01/27/2023 17:03    Procedures Procedures    Medications Ordered in ED Medications  furosemide (LASIX) injection 40 mg (40 mg Intravenous Given 01/27/23 2108)  ipratropium-albuterol (DUONEB) 0.5-2.5 (3) MG/3ML nebulizer solution 3 mL (3 mLs Nebulization Given 01/27/23 2113)  iohexol (OMNIPAQUE) 350 MG/ML injection 75 mL (75 mLs Intravenous Contrast Given 01/27/23 2309)    ED Course/ Medical Decision Making/ A&P                                 Medical Decision Making Amount and/or Complexity of Data Reviewed Labs: ordered. Radiology: ordered.  Risk Prescription drug management. Decision regarding hospitalization.   This patient presents to the ED for concern of fatigue and shortness of breath, this involves an extensive number of treatment options, and is a complaint that carries with it a high risk of complications and morbidity.  The differential diagnosis includes CHF exacerbation, ACS, pneumonia, PE, other infection, acidosis, other metabolic derangements, anemia   Co morbidities that complicate the  patient evaluation  seizures, polysubstance abuse, COPD, chronic hepatitis C, homelessness, CHF, CKD, HTN, sleep apnea, arthritis, anemia   Additional history obtained:  Additional history obtained from N/A External records from outside source obtained and reviewed including EMR   Lab Tests:  I Ordered, and personally interpreted labs.  The pertinent results include: Markedly elevated BNP, mildly elevated troponin, baseline creatinine, normal hemoglobin, baseline leukopenia   Imaging Studies ordered:  I ordered imaging studies including chest x-ray, CTA chest I independently visualized and interpreted imaging which showed no acute findings on x-ray, CTA pending at time of admission I agree with the radiologist interpretation   Cardiac Monitoring: / EKG:  The patient was maintained on a cardiac monitor.  I personally viewed and interpreted the cardiac monitored which showed an underlying rhythm of: Sinus rhythm   Problem List / ED Course / Critical interventions / Medication management  Patient presenting for worsening shortness of breath and fatigue for the past 2 days.  He has not been adherent to his home indications over this timeframe. He was most recently seen in the ED 5 days ago.  He did have his home medications refilled at the time.  Vital signs on arrival are notable for moderate hypertension.  SpO2 is currently normal on room air.  He does have increased work of breathing and diminished bibasilar lung sounds.  Lab work is notable for markedly elevated BNP.  I suspect volume overload.  IV Lasix was ordered.  Patient also has history of COPD.  Will give DuoNeb.  On reassessment, patient was mildly improved.  Given history of medication nonadherence and severity of symptoms today, patient would benefit from continued diuresis.  I spoke with hospitalist who will admit.  She did request CTA of chest which was ordered.  Patient was admitted for further management. I ordered  medication including Lasix for diuresis; DuoNeb for shortness of breath Reevaluation of the patient after these medicines showed that the patient improved  I have reviewed the patients home medicines and have made adjustments as needed   Social Determinants of Health:  Currently homeless, history of medication nonadherence        Final Clinical Impression(s) / ED Diagnoses Final diagnoses:  Acute on chronic congestive heart failure, unspecified heart failure type [facility])    Rx / DC Orders ED Discharge Orders     None         Gloris Manchester, MD 01/27/23 2334

## 2023-01-27 NOTE — ED Triage Notes (Signed)
Pt to the ed from home via ems with a CC of sob x 1 day. Pt has CHF and has not taken his lasix today. Pt denies  cp, dizziness, LOC.

## 2023-01-27 NOTE — Assessment & Plan Note (Signed)
Spoke about importance of discontinuing

## 2023-01-27 NOTE — ED Notes (Signed)
Pt becoming increasing hateful towards [facility] staff. This EMT advised him there is no way for [place] to give him an accurate wait time.

## 2023-01-27 NOTE — Assessment & Plan Note (Addendum)
-   Pt diagnosed with CHF based on presence of the following: , rales on exam,   DOE,   With noted response to IV diuretic in ER  admit on telemetry,  cycle cardiac enzymes, Troponin 46     obtain serial ECG  to evaluate for ischemia as a cause of heart failure  monitor daily weight:  Filed Weights   01/27/23 1452  Weight: 68 kg   Last BNP BNP (last 3 results) Recent Labs    12/21/22 1459 01/12/23 0357 01/27/23 1626  BNP 1,430.6* 1,327.9* 1,887.0*     diurese with IV lasix 40 mg q day and monitor orthostatics and creatinine to avoid over diuresis.  Order echogram to evaluate EF and valves

## 2023-01-27 NOTE — Assessment & Plan Note (Signed)
-  Spoke about importance of quitting spent 5 minutes discussing options for treatment, prior attempts at quitting, and dangers of smoking ? -At this point patient is    interested in quitting ? - order nicotine patch  ? - nursing tobacco cessation protocol ? ?

## 2023-01-27 NOTE — Assessment & Plan Note (Signed)
Chronic-stable.
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
## 2023-01-27 NOTE — ED Provider Triage Note (Signed)
Emergency Medicine Provider Triage Evaluation Note  Charles [name] , a 52 y.o. male  was evaluated in triage.  Pt complains of patient presents emergency department concerns of shortness of breath.  Reports is ongoing for the last day or so.  History of CHF but has not taken Lasix today.  Denies any chest pain, dizziness, wheezing, nausea, vomit, diarrhea.  Review of Systems  Positive: As above Negative: As above  Physical Exam  BP (!) 141/109 (BP Location: Right Arm)   Pulse 88   Temp 98.8 F (37.1 C) (Oral)   Resp 17   Ht 5\' 6"  (1.676 m)   Wt 68 kg   SpO2 100%   BMI 24.20 kg/m  Gen:   Awake, no distress   Resp:  Normal effort, no adventitious lung sounds MSK:   Moves extremities without difficulty  Other:    Medical Decision Making  Medically screening exam initiated at 3:22 PM.  Appropriate orders placed.  Charles [name] was informed that the remainder of the evaluation will be completed by another provider, this initial triage assessment does not replace that evaluation, and the importance of remaining in the ED until their evaluation is complete.     Charles Knudsen, PA-C 01/27/23 1523

## 2023-01-27 NOTE — Assessment & Plan Note (Signed)
Chronic stable currently no evidence of wheezing

## 2023-01-27 NOTE — Assessment & Plan Note (Signed)
Transitional care consult

## 2023-01-27 NOTE — Assessment & Plan Note (Signed)
Stable in the setting of use of cocaine and CHF

## 2023-01-28 ENCOUNTER — Encounter (HOSPITAL_COMMUNITY): Payer: Self-pay | Admitting: Internal Medicine

## 2023-01-28 ENCOUNTER — Other Ambulatory Visit: Payer: Self-pay

## 2023-01-28 ENCOUNTER — Observation Stay (HOSPITAL_COMMUNITY): Payer: Medicare HMO

## 2023-01-28 DIAGNOSIS — R0602 Shortness of breath: Secondary | ICD-10-CM | POA: Diagnosis present

## 2023-01-28 DIAGNOSIS — I5021 Acute systolic (congestive) heart failure: Secondary | ICD-10-CM

## 2023-01-28 DIAGNOSIS — E871 Hypo-osmolality and hyponatremia: Secondary | ICD-10-CM | POA: Diagnosis present

## 2023-01-28 DIAGNOSIS — Z5900 Homelessness unspecified: Secondary | ICD-10-CM | POA: Diagnosis not present

## 2023-01-28 DIAGNOSIS — Z800 Family history of malignant neoplasm of digestive organs: Secondary | ICD-10-CM | POA: Diagnosis not present

## 2023-01-28 DIAGNOSIS — I5023 Acute on chronic systolic (congestive) heart failure: Secondary | ICD-10-CM | POA: Diagnosis present

## 2023-01-28 DIAGNOSIS — F1410 Cocaine abuse, uncomplicated: Secondary | ICD-10-CM | POA: Diagnosis present

## 2023-01-28 DIAGNOSIS — N179 Acute kidney failure, unspecified: Secondary | ICD-10-CM | POA: Diagnosis not present

## 2023-01-28 DIAGNOSIS — Z801 Family history of malignant neoplasm of trachea, bronchus and lung: Secondary | ICD-10-CM | POA: Diagnosis not present

## 2023-01-28 DIAGNOSIS — F17210 Nicotine dependence, cigarettes, uncomplicated: Secondary | ICD-10-CM | POA: Diagnosis present

## 2023-01-28 DIAGNOSIS — J439 Emphysema, unspecified: Secondary | ICD-10-CM | POA: Diagnosis present

## 2023-01-28 DIAGNOSIS — N182 Chronic kidney disease, stage 2 (mild): Secondary | ICD-10-CM | POA: Diagnosis present

## 2023-01-28 DIAGNOSIS — D696 Thrombocytopenia, unspecified: Secondary | ICD-10-CM | POA: Diagnosis present

## 2023-01-28 DIAGNOSIS — Z7984 Long term (current) use of oral hypoglycemic drugs: Secondary | ICD-10-CM | POA: Diagnosis not present

## 2023-01-28 DIAGNOSIS — I130 Hypertensive heart and chronic kidney disease with heart failure and stage 1 through stage 4 chronic kidney disease, or unspecified chronic kidney disease: Secondary | ICD-10-CM | POA: Diagnosis present

## 2023-01-28 DIAGNOSIS — Z1152 Encounter for screening for COVID-19: Secondary | ICD-10-CM | POA: Diagnosis not present

## 2023-01-28 DIAGNOSIS — I444 Left anterior fascicular block: Secondary | ICD-10-CM | POA: Diagnosis present

## 2023-01-28 DIAGNOSIS — M25551 Pain in right hip: Secondary | ICD-10-CM | POA: Diagnosis present

## 2023-01-28 DIAGNOSIS — F1910 Other psychoactive substance abuse, uncomplicated: Secondary | ICD-10-CM | POA: Diagnosis not present

## 2023-01-28 DIAGNOSIS — R911 Solitary pulmonary nodule: Secondary | ICD-10-CM | POA: Diagnosis present

## 2023-01-28 DIAGNOSIS — B182 Chronic viral hepatitis C: Secondary | ICD-10-CM | POA: Diagnosis present

## 2023-01-28 DIAGNOSIS — Z91014 Allergy to mammalian meats: Secondary | ICD-10-CM | POA: Diagnosis not present

## 2023-01-28 LAB — HEPATITIS PANEL, ACUTE
HCV Ab: REACTIVE — AB
Hep A IgM: NONREACTIVE
Hep B C IgM: NONREACTIVE
Hepatitis B Surface Ag: NONREACTIVE

## 2023-01-28 LAB — COMPREHENSIVE METABOLIC PANEL
ALT: 20 U/L (ref 0–44)
AST: 27 U/L (ref 15–41)
Albumin: 2.9 g/dL — ABNORMAL LOW (ref 3.5–5.0)
Alkaline Phosphatase: 68 U/L (ref 38–126)
Anion gap: 9 (ref 5–15)
BUN: 18 mg/dL (ref 6–20)
CO2: 23 mmol/L (ref 22–32)
Calcium: 8.1 mg/dL — ABNORMAL LOW (ref 8.9–10.3)
Chloride: 103 mmol/L (ref 98–111)
Creatinine, Ser: 1.4 mg/dL — ABNORMAL HIGH (ref 0.61–1.24)
GFR, Estimated: >60 mL/min (ref >60)
Glucose, Bld: 135 mg/dL — ABNORMAL HIGH (ref 70–99)
Potassium: 4.1 mmol/L (ref 3.5–5.1)
Sodium: 135 mmol/L (ref 135–145)
Total Bilirubin: 0.7 mg/dL (ref 0.3–1.2)
Total Protein: 6.3 g/dL — ABNORMAL LOW (ref 6.5–8.1)

## 2023-01-28 LAB — ECHOCARDIOGRAM COMPLETE
Area-P 1/2: 3.3 cm2
Calc EF: 22.1 %
Height: 66 in
S' Lateral: 4.7 cm
Single Plane A2C EF: 32.2 %
Single Plane A4C EF: 14.1 %
Weight: 2363.33 [oz_av]

## 2023-01-28 LAB — CBC
HCT: 40.1 % (ref 39.0–52.0)
Hemoglobin: 13.6 g/dL (ref 13.0–17.0)
MCH: 35.2 pg — ABNORMAL HIGH (ref 26.0–34.0)
MCHC: 33.9 g/dL (ref 30.0–36.0)
MCV: 103.9 fL — ABNORMAL HIGH (ref 80.0–100.0)
Platelets: 128 10*3/uL — ABNORMAL LOW (ref 150–400)
RBC: 3.86 MIL/uL — ABNORMAL LOW (ref 4.22–5.81)
RDW: 16.9 % — ABNORMAL HIGH (ref 11.5–15.5)
WBC: 4.5 10*3/uL (ref 4.0–10.5)
nRBC: 0 % (ref 0.0–0.2)

## 2023-01-28 LAB — PREALBUMIN: Prealbumin: 19 mg/dL (ref 18–38)

## 2023-01-28 LAB — MAGNESIUM: Magnesium: 2.1 mg/dL (ref 1.7–2.4)

## 2023-01-28 MED ORDER — FUROSEMIDE 10 MG/ML IJ SOLN: 40.0000 mg | Freq: Every day | INTRAMUSCULAR | Status: DC

## 2023-01-28 MED ORDER — SODIUM CHLORIDE 0.9 % IV SOLN: 250.0000 mL | INTRAVENOUS | Status: DC | PRN

## 2023-01-28 MED ORDER — SODIUM CHLORIDE 0.9% FLUSH: 3.0000 mL | INTRAVENOUS | Status: DC | PRN

## 2023-01-28 MED ORDER — ACETAMINOPHEN 650 MG RE SUPP: 650.0000 mg | Freq: Four times a day (QID) | RECTAL | Status: DC | PRN

## 2023-01-28 MED ORDER — MOMETASONE FURO-FORMOTEROL FUM 100-5 MCG/ACT IN AERO
2.0000 | INHALATION_SPRAY | Freq: Two times a day (BID) | RESPIRATORY_TRACT | Status: DC
  Administered 2023-01-28 – 2023-01-31 (×4): 2 via RESPIRATORY_TRACT
  Filled 2023-01-28 (×2): qty 8.8

## 2023-01-28 MED ORDER — AMLODIPINE BESYLATE 10 MG PO TABS
10.0000 mg | ORAL_TABLET | Freq: Every day | ORAL | Status: DC
  Administered 2023-01-28 – 2023-01-29 (×2): 10 mg via ORAL
  Filled 2023-01-28 (×2): qty 1

## 2023-01-28 MED ORDER — ACETAMINOPHEN 325 MG PO TABS: 650.0000 mg | ORAL_TABLET | Freq: Four times a day (QID) | ORAL | Status: DC | PRN

## 2023-01-28 MED ORDER — EMPAGLIFLOZIN 10 MG PO TABS
10.0000 mg | ORAL_TABLET | Freq: Every day | ORAL | Status: DC
  Administered 2023-01-28 – 2023-01-31 (×4): 10 mg via ORAL
  Filled 2023-01-28 (×4): qty 1

## 2023-01-28 MED ORDER — SPIRONOLACTONE 25 MG PO TABS
25.0000 mg | ORAL_TABLET | Freq: Every day | ORAL | Status: DC
  Administered 2023-01-28 – 2023-01-31 (×4): 25 mg via ORAL
  Filled 2023-01-28 (×2): qty 1
  Filled 2023-01-28: qty 2
  Filled 2023-01-28: qty 1

## 2023-01-28 MED ORDER — GUAIFENESIN ER 600 MG PO TB12
600.0000 mg | ORAL_TABLET | Freq: Two times a day (BID) | ORAL | Status: DC
  Administered 2023-01-28 – 2023-01-31 (×7): 600 mg via ORAL
  Filled 2023-01-28 (×7): qty 1

## 2023-01-28 MED ORDER — SODIUM CHLORIDE 0.9% FLUSH
3.0000 mL | Freq: Two times a day (BID) | INTRAVENOUS | Status: DC
  Administered 2023-01-28 – 2023-01-31 (×7): 3 mL via INTRAVENOUS

## 2023-01-28 MED ORDER — EMPAGLIFLOZIN 10 MG PO TABS: 10.0000 mg | ORAL_TABLET | Freq: Every day | ORAL | Status: DC

## 2023-01-28 MED ORDER — HYDROCODONE-ACETAMINOPHEN 5-325 MG PO TABS
1.0000 | ORAL_TABLET | ORAL | Status: DC | PRN
  Administered 2023-01-29: 1 via ORAL
  Administered 2023-01-30: 2 via ORAL
  Filled 2023-01-28: qty 2
  Filled 2023-01-28: qty 1

## 2023-01-28 MED ORDER — ALBUTEROL SULFATE (2.5 MG/3ML) 0.083% IN NEBU: 2.5000 mg | INHALATION_SOLUTION | RESPIRATORY_TRACT | Status: DC | PRN

## 2023-01-28 MED ORDER — IPRATROPIUM-ALBUTEROL 0.5-2.5 (3) MG/3ML IN SOLN
3.0000 mL | Freq: Four times a day (QID) | RESPIRATORY_TRACT | Status: DC
  Filled 2023-01-28: qty 3

## 2023-01-28 MED ORDER — FUROSEMIDE 10 MG/ML IJ SOLN
40.0000 mg | Freq: Two times a day (BID) | INTRAMUSCULAR | Status: DC
  Administered 2023-01-28 – 2023-01-29 (×3): 40 mg via INTRAVENOUS
  Filled 2023-01-28 (×2): qty 4

## 2023-01-28 NOTE — ED Notes (Signed)
Pt refusing to wear cardiac leads at this time. Discussed risks and benefits of pt not wearing monitoring system and RN inability to track cardiac rhythm and HR. Pt verbalized understanding of the risk of being non-monitored

## 2023-01-28 NOTE — ED Notes (Signed)
Pt refuses v/s and monitor

## 2023-01-28 NOTE — Progress Notes (Signed)
Heart Failure Navigator Progress Note  Assessed for Heart & Vascular TOC clinic readiness.  Patient does not meet criteria due to [facility] patient.   Navigator will sign off at this time.    Tavon Corriher, BSN, RN Heart Failure Nurse Navigator Secure Chat Only   
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
## 2023-01-28 NOTE — ED Notes (Addendum)
Pt refuses to put on monitoring equipment to recheck vitals

## 2023-01-28 NOTE — Progress Notes (Addendum)
PROGRESS NOTE    Charles [name]  XBM:[id number] DOB: 12/15/1970 DOA: 01/27/2023 PCP: Renaye Rakers, MD  51/homeless male with history of chronic systolic CHF, polysubstance abuse, chronic hep C, CKD 3A, hypertension presented to the ED with shortness of breath for a few days, he does not take Lasix, recently seen in the ED with dyspnea on exertion and prescribed Lasix daily, reports last cocaine use from before. In the ED creatinine 1.3, BNP 1887, troponin 46, hemoglobin 14.9, cocaine positive CT chest with no acute findings, no PE, small pulmonary nodule  Subjective: Still some shortness of breath, mild cough  Assessment and Plan:  Acute on chronic systolic CHF -Last echo 3/24 with EF 30-35%, mildly reduced RV -Poor compliance with Lasix, limited by social situation, homelessness and substance abuse -Continue IV Lasix today, restart Jardiance and Aldactone -Social work consult  Tobacco abuse -Counseled  Hypertension -Blood pressure stable now, diuretics and meds as above  Lung nodule -Needs follow-up, smoking cessation counseled  Polysubstance abuse -Long history of cocaine use, counseled  See COPD -No wheezing, add ICS LABA    DVT prophylaxis: SCDs, allergic to pork products Code Status: Full Code Family Communication: None present Disposition Plan: TBD  Consultants:    Procedures:   Antimicrobials:    Objective: Vitals:   01/28/23 0138 01/28/23 0729 01/28/23 0730 01/28/23 0735  BP: (!) 136/102 (!) 144/112  (!) 144/112  Pulse: 89  87 87  Resp: 18   18  Temp: 98.6 F (37 C)   (!) 97.4 F (36.3 C)  TempSrc:    Oral  SpO2: 100%  98% 98%  Weight:      Height:       No intake or output data in the 24 hours ending 01/28/23 0937 Filed Weights   01/27/23 1452  Weight: 68 kg    Examination:  General exam: Appears calm and comfortable HEENT: Positive JVD CVS: S1-S2, regular rhythm Lungs: Few basilar Rales otherwise clear Abdomen: Soft, nontender,  bowel sounds present Extremities: Trace edema Skin: No rashes Psychiatry:  Mood & affect appropriate.     Data Reviewed:   CBC: Recent Labs  Lab 01/22/23 1229 01/27/23 1626 01/27/23 2224 01/28/23 0732  WBC 3.0* 3.0*  --  4.5  NEUTROABS  --  1.8  --   --   HGB 13.2 14.9 15.0 13.6  HCT 40.4 44.6 44.0 40.1  MCV 104.9* 106.4*  --  103.9*  PLT 128* 132*  --  128*   Basic Metabolic Panel: Recent Labs  Lab 01/22/23 1229 01/27/23 1626 01/27/23 2105 01/27/23 2224 01/28/23 0732  NA 140 138  --  140 135  K 4.4 4.6  --  3.9 4.1  CL 107 105  --   --  103  CO2 26 20*  --   --  23  GLUCOSE 121* 117*  --   --  135*  BUN 14 22*  --   --  18  CREATININE 1.51* 1.37*  --   --  1.40*  CALCIUM 8.8* 8.9  --   --  8.1*  MG  --   --  2.2  --  2.1   GFR: Estimated Creatinine Clearance: 56.3 mL/min (A) (by C-G formula based on SCr of 1.4 mg/dL (H)). Liver Function Tests: Recent Labs  Lab 01/27/23 1626 01/28/23 0732  AST 32 27  ALT 21 20  ALKPHOS 71 68  BILITOT 1.5* 0.7  PROT 7.4 6.3*  ALBUMIN 3.4* 2.9*   No results  for input(s): "LIPASE", "AMYLASE" in the last 168 hours. No results for input(s): "AMMONIA" in the last 168 hours. Coagulation Profile: Recent Labs  Lab 01/27/23 2105  INR 1.0   Cardiac Enzymes: Recent Labs  Lab 01/27/23 2105  CKTOTAL 328   BNP (last 3 results) No results for input(s): "PROBNP" in the last 8760 hours. HbA1C: No results for input(s): "HGBA1C" in the last 72 hours. CBG: No results for input(s): "GLUCAP" in the last 168 hours. Lipid Profile: No results for input(s): "CHOL", "HDL", "LDLCALC", "TRIG", "CHOLHDL", "LDLDIRECT" in the last 72 hours. Thyroid Function Tests: Recent Labs    01/27/23 2105  TSH 0.428   Anemia Panel: No results for input(s): "VITAMINB12", "FOLATE", "FERRITIN", "TIBC", "IRON", "RETICCTPCT" in the last 72 hours. Urine analysis:    Component Value Date/Time   COLORURINE YELLOW 01/27/2023 2105   APPEARANCEUR  CLEAR 01/27/2023 2105   LABSPEC 1.020 01/27/2023 2105   PHURINE 5.0 01/27/2023 2105   GLUCOSEU NEGATIVE 01/27/2023 2105   HGBUR NEGATIVE 01/27/2023 2105   BILIRUBINUR NEGATIVE 01/27/2023 2105   KETONESUR NEGATIVE 01/27/2023 2105   PROTEINUR NEGATIVE 01/27/2023 2105   UROBILINOGEN 1.0 03/10/2015 1203   NITRITE NEGATIVE 01/27/2023 2105   LEUKOCYTESUR NEGATIVE 01/27/2023 2105   Sepsis Labs: @LABRCNTIP (procalcitonin:4,lacticidven:4)  ) Recent Results (from the past 240 hour(s))  SARS Coronavirus 2 by RT PCR (hospital order, performed in [facility] hospital lab) *cepheid single result test* Anterior Nasal Swab     Status: None   Collection Time: 01/27/23  8:15 PM   Specimen: Anterior Nasal Swab  Result Value Ref Range Status   SARS Coronavirus 2 by RT PCR NEGATIVE NEGATIVE Final    Comment: Performed at [facility], [address], [place], [place] [address]     Radiology Studies: CT Angio Chest PE W and/or Wo Contrast  Result Date: 01/27/2023 CLINICAL DATA:  Shortness of breath EXAM: CT ANGIOGRAPHY CHEST WITH CONTRAST TECHNIQUE: Multidetector CT imaging of the chest was performed using the standard protocol during bolus administration of intravenous contrast. Multiplanar CT image reconstructions and MIPs were obtained to evaluate the vascular anatomy. RADIATION DOSE REDUCTION: This exam was performed according to the departmental dose-optimization program which includes automated exposure control, adjustment of the mA and/or kV according to patient size and/or use of iterative reconstruction technique. CONTRAST:  75mL OMNIPAQUE IOHEXOL 350 MG/ML SOLN COMPARISON:  Chest x-ray from earlier in the same day, CT from 07/04/2022 FINDINGS: Cardiovascular: Thoracic aorta and its branches are within normal limits. No aneurysmal dilatation or dissection is seen. The heart is at the upper limits of normal in size. The pulmonary artery shows a normal branching pattern bilaterally. No filling  defect to suggest pulmonary embolism is noted. Mild coronary calcifications are noted. Mediastinum/Nodes: Thoracic inlet is within normal limits. The esophagus as visualized is unremarkable. No hilar or mediastinal adenopathy is noted. Lungs/Pleura: Diffuse emphysematous changes are identified bilaterally. No sizable effusion is seen. No focal confluent infiltrate is noted. New 4 mm right upper lobe nodule is noted best seen on image number 51 of series 5. This was not seen on the prior exam. No other significant nodules are noted. Upper Abdomen: Stomach is distended with ingested food stuffs. No other focal abnormality is noted. Postsurgical changes in the splenic hilum are seen. Musculoskeletal: Degenerative changes of the thoracic spine are seen. Review of the MIP images confirms the above findings. IMPRESSION: No evidence of pulmonary emboli. Right solid pulmonary nodule within the upper lobe measuring 4 mm. Per  Fleischner Society Guidelines,if patient is low risk for malignancy, no routine follow-up imaging is recommended. If patient is high risk for malignancy, a non-contrast Chest CT at 12 months is optional. If performed and the nodule is stable at 12 months, no further follow-up is recommended. These guidelines do not apply to immunocompromised patients and patients with cancer. Follow up in patients with significant comorbidities as clinically warranted. For lung cancer screening, adhere to Lung-RADS guidelines. Reference: Radiology. 2017; 284(1):228-43. Emphysema (ICD10-J43.9). Electronically Signed   By: Alcide Clever M.D.   On: 01/27/2023 23:46   DG Chest 2 View  Result Date: 01/27/2023 CLINICAL DATA:  Acute shortness of breath beginning today. EXAM: CHEST - 2 VIEW COMPARISON:  01/13/2023 FINDINGS: Stable mild to moderate cardiomegaly. Both lungs are clear. No evidence of pleural effusion. Embolization coil again noted in the left upper quadrant. IMPRESSION: Stable cardiomegaly. No active lung  disease. Electronically Signed   By: Danae Orleans M.D.   On: 01/27/2023 17:03     Scheduled Meds:  empagliflozin  10 mg Oral QAC breakfast   furosemide  40 mg Intravenous Q12H   guaiFENesin  600 mg Oral BID   ipratropium-albuterol  3 mL Nebulization QID   nicotine  7 mg Transdermal Daily   sodium chloride flush  3 mL Intravenous Q12H   spironolactone  25 mg Oral Daily   Continuous Infusions:  sodium chloride       LOS: 0 days    Time spent:    Zannie Cove, MD [facility]   01/28/2023, 9:37 AM

## 2023-01-28 NOTE — Plan of Care (Signed)

## 2023-01-29 DIAGNOSIS — D696 Thrombocytopenia, unspecified: Secondary | ICD-10-CM | POA: Diagnosis not present

## 2023-01-29 DIAGNOSIS — N182 Chronic kidney disease, stage 2 (mild): Secondary | ICD-10-CM | POA: Diagnosis not present

## 2023-01-29 DIAGNOSIS — F1910 Other psychoactive substance abuse, uncomplicated: Secondary | ICD-10-CM

## 2023-01-29 DIAGNOSIS — J42 Unspecified chronic bronchitis: Secondary | ICD-10-CM

## 2023-01-29 DIAGNOSIS — B182 Chronic viral hepatitis C: Secondary | ICD-10-CM

## 2023-01-29 DIAGNOSIS — I5023 Acute on chronic systolic (congestive) heart failure: Secondary | ICD-10-CM | POA: Diagnosis not present

## 2023-01-29 MED ORDER — FUROSEMIDE 10 MG/ML IJ SOLN
60.0000 mg | Freq: Two times a day (BID) | INTRAMUSCULAR | Status: DC
  Administered 2023-01-29: 60 mg via INTRAVENOUS
  Filled 2023-01-29: qty 6

## 2023-01-29 MED ORDER — SIMETHICONE 80 MG PO CHEW
80.0000 mg | CHEWABLE_TABLET | Freq: Once | ORAL | Status: AC
  Administered 2023-01-29: 80 mg via ORAL
  Filled 2023-01-29: qty 1

## 2023-01-29 MED ORDER — FUROSEMIDE 10 MG/ML IJ SOLN
20.0000 mg | Freq: Once | INTRAMUSCULAR | Status: AC
  Administered 2023-01-29: 20 mg via INTRAVENOUS
  Filled 2023-01-29: qty 2

## 2023-01-29 MED ORDER — LOSARTAN POTASSIUM 25 MG PO TABS
25.0000 mg | ORAL_TABLET | Freq: Every day | ORAL | Status: DC
  Administered 2023-01-30 – 2023-01-31 (×2): 25 mg via ORAL
  Filled 2023-01-29 (×2): qty 1

## 2023-01-29 NOTE — Assessment & Plan Note (Addendum)
AKI Hyponatremia.   His volume status has improved, at the time of his discharge his renal function had a serum cr of 1.34 with K at 4.4 and serum bicarbonate at 25. Na 131 and Mg 2.3  Continue with spironolactone and empagliflozin. Loop diuretic with furosemide

## 2023-01-29 NOTE — Assessment & Plan Note (Addendum)
Echocardiogram with reduced LV systolic function with EF 25 to 30%, global hypokinesis, moderate left ventricular hypertrophy, mild reduction in RV systolic function, no significant valvular disease.   Elevated troponin due to heart failure exacerbation, ruled out for acute coronary syndrome.   Patient was placed on IV furosemide for diuresis, negative fluid balance was achieved, -3,354 ml, with significant improvement in his symptoms.   Heart failure guideline medical therapy with empagliflozin, spironolactone, losartan.   Loop diuretic therapy with furosemide.   Holding on B blocker due to reduced LV systolic function with acute exacerbation.   Patient will need close follow up as outpatient, will send his medication to transition of care pharmacy.

## 2023-01-29 NOTE — Consult Note (Signed)
[facility] Inpatient Consult   01/29/2023  Charles [name] 04-08-1971 [id number]    Primary Care Provider:  Not with PCP listed provider, Renaye Rakers, MD, office denies patient is active check provider.   Patient continuously missing appointment follow ups scheduled. Patient noted with 6 admission and 14 ED visits in the past 6 months.  Report on unit rounds received that patient has missed 3 appointments set up with [facility] and they would try with one more appointment to establish care.  Insurance:  [employer] [[employer]] plan noted and is managed with an external case management group with [employer]  Met with patient at the bedside regarding post hospital follow up needs, he is not engaging, turns his head to wall, and asked if light could be turned out.  Patient clearly stated he didn't want anyone in his business.  Lights turned off and exited. Discussed with inpatient [facility] RN.  Will sign off.  For questions, please call:  Charlesetta Shanks, RN, BSN, CCM [place]  [facility], [facility] Liaison Direct Dial: [phone] or secure chat Website: [name]         .

## 2023-01-29 NOTE — Assessment & Plan Note (Signed)
No clinical signs of liver failure. Plan to follow up as outpatient.

## 2023-01-29 NOTE — Assessment & Plan Note (Addendum)
Positive cocaine on admission.  Tobacco abuse, smoking cessation.

## 2023-01-29 NOTE — Plan of Care (Signed)

## 2023-01-29 NOTE — Progress Notes (Signed)
Progress Note   Patient: Charles [name] ZOX:[id number] DOB: 01/28/1971 DOA: 01/27/2023     1 DOS: the patient was seen and examined on 01/29/2023   Brief hospital course: Charles [name] was admitted to the hospital with the working diagnosis of heart failure exacerbation.   51/homeless male with history of chronic systolic CHF, polysubstance abuse, chronic hep C, CKD 3A, hypertension presented to the ED with shortness of breath for a few days, he does not take Lasix, recently seen in the ED with dyspnea on exertion and prescribed Lasix daily, reports last cocaine use from before. In the ED creatinine 1.3, BNP 1887, troponin 46, hemoglobin 14.9, cocaine positive  Chest radiograph with cardiomegaly and bilateral hilar vascular congestion.   CT chest with apical emphysema bilaterally, bilateral ground glass opacities, with 4 mm right upper nodule. No evidence of pulmonary embolism.   EKG 83 bpm, left axis deviation, left anterior fascicular block, qtc 507, sinus rhythm with biatrial enlargement, no significant ST segment changes, negative T wave lead I, AVL, V2, V3, V5, V6. Positive LVH.       Assessment and Plan: Acute on chronic systolic congestive heart failure (HCC) Echocardiogram with reduced LV systolic function with EF 25 to 30%, global hypokinesis, moderate left ventricular hypertrophy, mild reduction in RV systolic function, no significant valvular disease.   Elevated troponin due to heart failure exacerbation, ruled out for acute coronary syndrome.   Urine output is 1,150 ml Systolic blood pressure 117 to 124 mmHg.   Plan to continue diuresis with IV furosemide, increase dose to 60 mg IV q12 hrs Continue with empagliflozin and spironolactone.  Add low dose losartan, holding on B blocker due to reduced LV systolic function with acute exacerbation.   CKD (chronic kidney disease), stage II Hyponatremia.   Renal function with serum cr at 1,49 with K at 4,2 and serum bicarbonate at  24.  Na 134   Plan to continue diuresis with furosemide, spironolactone and empagliflozin.  Polysubstance abuse (HCC) Positive cocaine on admission.  Tobacco abuse, smoking cessation.    Thrombocytopenia (HCC) Stable plt at 118   Chronic hepatitis C (HCC) No clinical signs of liver failure. Plan to follow up as outpatient.   COPD (chronic obstructive pulmonary disease) (HCC) No clinical signs of exacerbation. Continue bronchodilator therapy. Follow up pulmonary nodule as outpatient.         Subjective: Patient continue to have dyspnea, mild improvement from admission but not back to baseline   Physical Exam: Vitals:   01/29/23 0004 01/29/23 0407 01/29/23 0720 01/29/23 0802  BP: (!) 122/93 (!) 129/92 (!) 117/94 (!) 124/94  Pulse: 70 80 92 76  Resp: 18 18 18 16   Temp: 98.8 F (37.1 C) 97.7 F (36.5 C) 97.8 F (36.6 C)   TempSrc: Oral Oral Oral   SpO2: 100% 99% 96% 95%  Weight:  66.9 kg    Height:       Neurology awake and alert ENT with no pallor Cardiovascular with S1 and S2 present and regular with no gallops, rubs or murmurs. Moderate JVD No lower extremity edema, warm temperature Respiratory with mild rales at bases with no wheezing or rhonchi Abdomen with no distention  Data Reviewed:    Family Communication: no family at the bedside   Disposition: Status is: Inpatient Remains inpatient appropriate because: IV diuresis   Planned Discharge Destination: Home      Author: Coralie Keens, MD 01/29/2023 8:56 AM  For on call review [email or web address].

## 2023-01-29 NOTE — TOC Initial Note (Addendum)
Transition of Care [facility]) - Initial/Assessment Note    Patient Details  Name: Charles [name] MRN: [id number] Date of Birth: [birth year]  [facility]) CM/SW Contact:    Leone Haven, RN Phone Number: 01/29/2023, 8:54 AM  Clinical Narrative:                 May be Homeless he says it is no one's business where he lives, has no PCP but has insurance on file, states has no HH services in place at this time or DME .  States niece will transport him home at [employer] and she is support system, states gets medications from [facility] [address].  Pta self ambulatory.  On previous admits he was assisted with bus pass he may need bus pass at dc.  NCM added shelter list and SA resources to AVS.   Expected Discharge Plan: Homeless Shelter Barriers to Discharge: Continued Medical Work up   Patient Goals and CMS Choice Patient states their goals for this hospitalization and ongoing recovery are:: get better   Choice offered to / list presented to : NA      Expected Discharge Plan and Services In-house Referral: NA Discharge Planning Services: CM Consult Post Acute Care Choice: NA Living arrangements for the past 2 months: Homeless Shelter                 DME Arranged: N/A DME Agency: NA       HH Arranged: NA          Prior Living Arrangements/Services Living arrangements for the past 2 months: Homeless Shelter Lives with::  (homeless) Patient language and need for interpreter reviewed:: Yes Do you feel safe going back to the place where you live?: Yes      Need for Family Participation in Patient Care: No (Comment) Care giver support system in place?: No (comment)   Criminal Activity/Legal Involvement Pertinent to Current Situation/Hospitalization: No - Comment as needed  Activities of Daily Living Home Assistive Devices/Equipment: None ADL Screening (condition at time of admission) Patient's cognitive ability adequate to safely complete daily activities?: Yes Is the  patient deaf or have difficulty hearing?: No Does the patient have difficulty seeing, even when wearing glasses/contacts?: Yes (blurry) Does the patient have difficulty concentrating, remembering, or making decisions?: No Patient able to express need for assistance with ADLs?: No Does the patient have difficulty dressing or bathing?: No Independently performs ADLs?: Yes (appropriate for developmental age) Does the patient have difficulty walking or climbing stairs?: No Weakness of Legs: None Weakness of Arms/Hands: None  Permission Sought/Granted                  Emotional Assessment Appearance:: Appears stated age Attitude/Demeanor/Rapport: Guarded Affect (typically observed): Blunt Orientation: : Oriented to Self, Oriented to Place, Oriented to  Time, Oriented to Situation Alcohol / Substance Use: Illicit Drugs Psych Involvement: No (comment)  Admission diagnosis:  Acute on chronic systolic CHF (congestive heart failure) (HCC) [I50.23] Acute on chronic congestive heart failure, unspecified heart failure type [facility]) [I50.9] Patient Active Problem List   Diagnosis Date Noted   Acute on chronic systolic CHF (congestive heart failure) (HCC) 01/27/2023   Prolonged QT interval 01/27/2023   Elevated blood pressure reading with diagnosis of hypertension 11/27/2022   Hypertensive crisis 11/26/2022   Generalized weakness 11/26/2022   Laceration of hand 11/16/2022   COPD (chronic obstructive pulmonary disease) (HCC) 10/03/2022   CHF (congestive heart failure) (HCC) 10/02/2022   Acute hypoxic  respiratory failure (HCC) 09/03/2022   Chronic pain 08/13/2022   Acute on chronic congestive heart failure (HCC) 08/10/2022   Hypertension 07/22/2022   Acute on chronic systolic congestive heart failure (HCC) 02/28/2022   Elevated troponin 02/28/2022   CKD (chronic kidney disease), stage II 02/28/2022   Cocaine abuse (HCC) 02/28/2022   Bacteremia 05/27/2021   Acute on chronic systolic and  diastolic CHF (congestive heart failure) (HCC) 05/27/2021   Hypertensive urgency 05/26/2021   Polysubstance abuse (HCC) 05/26/2021   COPD with acute exacerbation (HCC) 05/26/2021   Chronic hepatitis C (HCC) 05/26/2021   Homelessness 05/26/2021   Seizure (HCC) 06/06/2020   Hypokalemia 06/22/2019   Splenic laceration, initial encounter 08/02/2016   Thrombocytopenia (HCC) 10/19/2014   Tobacco abuse 10/19/2014   PCP:  Renaye Rakers, MD Pharmacy:   [facility] [phone] - [place], [place] - [address] AT [facility] [address] [place] [address] Phone: [phone] Fax: [phone]  [id number] [place], [place] - [address] AT [facility] OF [address] [place]  [address] Phone: [phone] Fax: [phone]  [facility] [address] [place] [address] Phone: [phone] Fax: [phone]  [place] [facility] [address] [place] [address] Phone: [phone] Fax: [phone]  [facility] [address] [place] [address] Phone: [phone] Fax: [phone]     Social Determinants of Health (SDOH) Social History: SDOH Screenings   Food Insecurity: Patient Declined (01/28/2023)  Recent Concern: Food Insecurity - Food Insecurity Present (11/27/2022)  Housing: High Risk (01/28/2023)  Transportation Needs: Patient Declined (01/28/2023)  Recent Concern: Transportation Needs - Unmet Transportation Needs (11/27/2022)  Utilities: Patient Declined (01/28/2023)  Recent Concern: Utilities - At Risk (11/27/2022)  Social Connections: Unknown (09/24/2021)   [facility] from [facility]  Tobacco Use: High Risk (01/28/2023)   SDOH Interventions:     Readmission Risk Interventions    01/29/2023    8:43 AM 10/04/2022   11:27 AM 08/12/2022    4:45 PM   Readmission Risk Prevention Plan  Transportation Screening Complete Complete Complete  Medication Review Oceanographer) Complete Complete Complete  PCP or Specialist appointment within 3-5 days of discharge Complete Complete Complete  HRI or Home Care Consult Complete Complete Complete  SW Recovery Care/Counseling Consult  Complete Complete  Palliative Care Screening Not Applicable Not Applicable Not Applicable  Skilled Nursing Facility Not Applicable Not Applicable Not Applicable

## 2023-01-29 NOTE — Hospital Course (Addendum)
Charles [name] was admitted to the hospital with the working diagnosis of heart failure exacerbation.   51/homeless male with history of chronic systolic CHF, polysubstance abuse, chronic hep C, CKD 3A, hypertension presented to the ED with shortness of breath for a few days, he does not take Lasix, recently seen in the ED with dyspnea on exertion and prescribed Lasix daily, reports last cocaine use from before. On his initial physical examination his blood pressure was 141/109, HR 88, RR 17 and 02 saturation 100%, lungs with no wheezing, positive rales, heart with S1 and S2 present and regular with no gallops or rubs, abdomen with no distention, no lower extremity edema.   Na 138, K 4,6 Cl 105 bicarbonate 20, bun 22 cr 1,37  Mg 2.2  BNP 1,887  High sensitive troponin 46 and 49  Wbc 3.0 hgb 14.9 plt 132  D dimer 0,93  SARS covid 19 negative   Chest radiograph with cardiomegaly and bilateral hilar vascular congestion.   CT chest with apical emphysema bilaterally, bilateral ground glass opacities, with 4 mm right upper nodule. No evidence of pulmonary embolism.   EKG 83 bpm, left axis deviation, left anterior fascicular block, qtc 507, sinus rhythm with biatrial enlargement, no significant ST segment changes, negative T wave lead I, AVL, V2, V3, V5, V6. Positive LVH.   Patient was placed on furosemide for diuresis.   09/20 Volume status has improved, patient has been advised about compliance with his medications and follow up as outpatient.

## 2023-01-29 NOTE — Assessment & Plan Note (Signed)
Stable plt at 118

## 2023-01-29 NOTE — Plan of Care (Signed)
Problem: Education: Goal: Ability to verbalize understanding of medication therapies will improve Outcome: Progressing Goal: Individualized Educational Video(s) Outcome: Progressing   Problem: Cardiac: Goal: Ability to achieve and maintain adequate cardiopulmonary perfusion will improve Outcome: Progressing

## 2023-01-29 NOTE — Assessment & Plan Note (Signed)
No clinical signs of exacerbation. Continue bronchodilator therapy. Follow up pulmonary nodule as outpatient.

## 2023-01-30 DIAGNOSIS — F1910 Other psychoactive substance abuse, uncomplicated: Secondary | ICD-10-CM | POA: Diagnosis not present

## 2023-01-30 DIAGNOSIS — N182 Chronic kidney disease, stage 2 (mild): Secondary | ICD-10-CM | POA: Diagnosis not present

## 2023-01-30 DIAGNOSIS — I5023 Acute on chronic systolic (congestive) heart failure: Secondary | ICD-10-CM | POA: Diagnosis not present

## 2023-01-30 DIAGNOSIS — D696 Thrombocytopenia, unspecified: Secondary | ICD-10-CM | POA: Diagnosis not present

## 2023-01-30 LAB — MAGNESIUM: Magnesium: 2.3 mg/dL (ref 1.7–2.4)

## 2023-01-30 LAB — BASIC METABOLIC PANEL
Anion gap: 13 (ref 5–15)
BUN: 35 mg/dL — ABNORMAL HIGH (ref 6–20)
CO2: 29 mmol/L (ref 22–32)
Calcium: 9.1 mg/dL (ref 8.9–10.3)
Chloride: 92 mmol/L — ABNORMAL LOW (ref 98–111)
Creatinine, Ser: 1.79 mg/dL — ABNORMAL HIGH (ref 0.61–1.24)
GFR, Estimated: 45 mL/min — ABNORMAL LOW (ref >60)
Glucose, Bld: 98 mg/dL (ref 70–99)
Potassium: 4.6 mmol/L (ref 3.5–5.1)
Sodium: 134 mmol/L — ABNORMAL LOW (ref 135–145)

## 2023-01-30 MED ORDER — SIMETHICONE 80 MG PO CHEW
160.0000 mg | CHEWABLE_TABLET | Freq: Four times a day (QID) | ORAL | Status: DC | PRN
  Administered 2023-01-30: 160 mg via ORAL
  Filled 2023-01-30: qty 2

## 2023-01-30 MED ORDER — SIMETHICONE 80 MG PO CHEW: 160.0000 mg | CHEWABLE_TABLET | Freq: Four times a day (QID) | ORAL | Status: DC | PRN

## 2023-01-30 NOTE — Progress Notes (Signed)
Progress Note   Patient: Charles [name] ZOX:[id number] DOB: 1971/02/25 DOA: 01/27/2023     2 DOS: the patient was seen and examined on 01/30/2023   Brief hospital course: Mr. Charles [name] was admitted to the hospital with the working diagnosis of heart failure exacerbation.   51/homeless male with history of chronic systolic CHF, polysubstance abuse, chronic hep C, CKD 3A, hypertension presented to the ED with shortness of breath for a few days, he does not take Lasix, recently seen in the ED with dyspnea on exertion and prescribed Lasix daily, reports last cocaine use from before. On his initial physical examination his blood pressure was 141/109, HR 88, RR 17 and 02 saturation 100%, lungs with no wheezing, positive rales, heart with S1 and S2 present and regular with no gallops or rubs, abdomen with no distention, no lower extremity edema.   Na 138, K 4,6 Cl 105 bicarbonate 20, bun 22 cr 1,37  Mg 2.2  BNP 1,887  High sensitive troponin 46 and 49  Wbc 3.0 hgb 14.9 plt 132  D dimer 0,93  SARS covid 19 negative   Chest radiograph with cardiomegaly and bilateral hilar vascular congestion.   CT chest with apical emphysema bilaterally, bilateral ground glass opacities, with 4 mm right upper nodule. No evidence of pulmonary embolism.   EKG 83 bpm, left axis deviation, left anterior fascicular block, qtc 507, sinus rhythm with biatrial enlargement, no significant ST segment changes, negative T wave lead I, AVL, V2, V3, V5, V6. Positive LVH.   Patient was placed on furosemide for diuresis.     Assessment and Plan: Acute on chronic systolic congestive heart failure (HCC) Echocardiogram with reduced LV systolic function with EF 25 to 30%, global hypokinesis, moderate left ventricular hypertrophy, mild reduction in RV systolic function, no significant valvular disease.   Elevated troponin due to heart failure exacerbation, ruled out for acute coronary syndrome.   Urine output is 2,275 ml Systolic  blood pressure 107 to 140 mmHg.   Continue with empagliflozin and spironolactone.  Losartan for ARB. Holding on B blocker due to reduced LV systolic function with acute exacerbation.  Hold on loop diuretic today, improvement in volume status.  Plan to resume oral diuretic therapy in am.   CKD (chronic kidney disease), stage II AKI Hyponatremia.   Improved volume status, renal function today with serum cr at 1,79 with K at 4,6 and serum bicarbonate at 29. Na 134 Mg 2.3   Continue with spironolactone and empagliflozin. Follow up renal function and electrolytes in am.   Polysubstance abuse (HCC) Positive cocaine on admission.  Tobacco abuse, smoking cessation.    Thrombocytopenia (HCC) Stable plt at 118   Chronic hepatitis C (HCC) No clinical signs of liver failure. Plan to follow up as outpatient.   COPD (chronic obstructive pulmonary disease) (HCC) No clinical signs of exacerbation. Continue bronchodilator therapy. Follow up pulmonary nodule as outpatient.         Subjective: Patient with improvement in dyspnea, positive nasal congestion and right hip pain today. No chest pain.,   Physical Exam: Vitals:   01/30/23 0435 01/30/23 0509 01/30/23 0723 01/30/23 0814  BP: (!) 140/96   (!) 149/104  Pulse: 83   85  Resp: 18   16  Temp: 97.8 F (36.6 C)   97.9 F (36.6 C)  TempSrc: Oral   Oral  SpO2:   99% 99%  Weight:  66.8 kg    Height:       Neurology awake and  alert ENT with no pallor Cardiovascular with S1 and S2 present and regular with no gallops, rubs or murmurs No JVD No lower extremity edema Respiratory with no rales or wheezing, no rhonchi Abdomen with no distention  Data Reviewed:    Family Communication: no family at the bedside   Disposition: Status is: Inpatient Remains inpatient appropriate because: monitor renal function post diuresis.   Planned Discharge Destination: Home     Author: Coralie Keens, MD 01/30/2023 11:00  AM  For on call review [email or web address].

## 2023-01-31 ENCOUNTER — Other Ambulatory Visit (HOSPITAL_COMMUNITY): Payer: Self-pay

## 2023-01-31 DIAGNOSIS — Z5900 Homelessness unspecified: Secondary | ICD-10-CM

## 2023-01-31 DIAGNOSIS — I5023 Acute on chronic systolic (congestive) heart failure: Secondary | ICD-10-CM | POA: Diagnosis not present

## 2023-01-31 DIAGNOSIS — F1910 Other psychoactive substance abuse, uncomplicated: Secondary | ICD-10-CM | POA: Diagnosis not present

## 2023-01-31 DIAGNOSIS — D696 Thrombocytopenia, unspecified: Secondary | ICD-10-CM | POA: Diagnosis not present

## 2023-01-31 DIAGNOSIS — N182 Chronic kidney disease, stage 2 (mild): Secondary | ICD-10-CM | POA: Diagnosis not present

## 2023-01-31 DIAGNOSIS — R9431 Abnormal electrocardiogram [ECG] [EKG]: Secondary | ICD-10-CM

## 2023-01-31 LAB — RENAL FUNCTION PANEL
Albumin: 3 g/dL — ABNORMAL LOW (ref 3.5–5.0)
Anion gap: 8 (ref 5–15)
BUN: 27 mg/dL — ABNORMAL HIGH (ref 6–20)
CO2: 25 mmol/L (ref 22–32)
Calcium: 8.5 mg/dL — ABNORMAL LOW (ref 8.9–10.3)
Chloride: 98 mmol/L (ref 98–111)
Creatinine, Ser: 1.34 mg/dL — ABNORMAL HIGH (ref 0.61–1.24)
GFR, Estimated: >60 mL/min (ref >60)
Glucose, Bld: 103 mg/dL — ABNORMAL HIGH (ref 70–99)
Phosphorus: 4.4 mg/dL (ref 2.5–4.6)
Potassium: 4.4 mmol/L (ref 3.5–5.1)
Sodium: 131 mmol/L — ABNORMAL LOW (ref 135–145)

## 2023-01-31 MED ORDER — LOSARTAN POTASSIUM 25 MG PO TABS
25.0000 mg | ORAL_TABLET | Freq: Every day | ORAL | 0 refills | Status: DC
  Filled 2023-01-31: qty 30, 30d supply, fill #0

## 2023-01-31 MED ORDER — EMPAGLIFLOZIN 10 MG PO TABS
10.0000 mg | ORAL_TABLET | Freq: Every day | ORAL | 0 refills | Status: DC
  Filled 2023-01-31: qty 30, 30d supply, fill #0

## 2023-01-31 MED ORDER — FUROSEMIDE 40 MG PO TABS
40.0000 mg | ORAL_TABLET | Freq: Every day | ORAL | 0 refills | Status: DC
  Filled 2023-01-31: qty 30, 30d supply, fill #0

## 2023-01-31 MED ORDER — SPIRONOLACTONE 25 MG PO TABS
25.0000 mg | ORAL_TABLET | Freq: Every day | ORAL | 0 refills | Status: DC
  Filled 2023-01-31: qty 30, 30d supply, fill #0

## 2023-01-31 NOTE — Care Management Important Message (Signed)
Important Message  Patient Details  Name: Charles [name] MRN: [id number] Date of Birth: 18-Jan-1971   Medicare Important Message Given:  Yes     Zerrick Hanssen 01/31/2023, 2:52 PM

## 2023-01-31 NOTE — Progress Notes (Signed)
Pt discharged to home in stable condition. Pt states "you don't have to review anything with me because I am not going to do any of it. Just give me the paperwork and my medication and let me go." Pt transported off unit via wheelchair with staff x1 to discharge lounge to await TOC meds. Pt given bus pass for home transit.

## 2023-01-31 NOTE — TOC Transition Note (Signed)
Transition of Care [facility]) - CM/SW Discharge Note   Patient Details  Name: Charles [name] MRN: [id number] Date of Birth: [birth year]  [facility] CM/SW Contact:  Leone Haven, RN Phone Number: 01/31/2023, 10:31 AM   Clinical Narrative:    Patient is for dc today, he has transportation today.  TOC to fill meds. No needs.    Final next level of care: Home/Self Care Barriers to Discharge: Continued Medical Work up   Patient Goals and CMS Choice   Choice offered to / list presented to : NA  Discharge Placement                         Discharge Plan and Services Additional resources added to the After Visit Summary for   In-house Referral: NA Discharge Planning Services: CM Consult Post Acute Care Choice: NA          DME Arranged: N/A DME Agency: NA       HH Arranged: NA          Social Determinants of Health (SDOH) Interventions SDOH Screenings   Food Insecurity: Patient Declined (01/28/2023)  Recent Concern: Food Insecurity - Food Insecurity Present (11/27/2022)  Housing: High Risk (01/28/2023)  Transportation Needs: Patient Declined (01/28/2023)  Recent Concern: Transportation Needs - Unmet Transportation Needs (11/27/2022)  Utilities: Patient Declined (01/28/2023)  Recent Concern: Utilities - At Risk (11/27/2022)  Social Connections: Unknown (09/24/2021)   Received from [facility], [facility]  Tobacco Use: High Risk (01/28/2023)     Readmission Risk Interventions    01/29/2023    8:43 AM 10/04/2022   11:27 AM 08/12/2022    4:45 PM  Readmission Risk Prevention Plan  Transportation Screening Complete Complete Complete  Medication Review (RN Care Manager) Complete Complete Complete  PCP or Specialist appointment within 3-5 days of discharge Complete Complete Complete  HRI or Home Care Consult Complete Complete Complete  SW Recovery Care/Counseling Consult  Complete Complete  Palliative Care Screening Not Applicable Not Applicable  Not Applicable  Skilled Nursing Facility Not Applicable Not Applicable Not Applicable

## 2023-01-31 NOTE — Discharge Summary (Signed)
Physician Discharge Summary   Patient: Charles [name] MRN: [id number] DOB: 1971-02-23  Admit date:     01/27/2023  Discharge date: 01/31/23  Discharge Physician: Charles [name]   PCP: Charles Rakers, MD   Recommendations at discharge:    Patient will continue heart failure guideline directed medical therapy with losartan, SGLT 2 inh, spironolactone and diuresis with furosemide. No B blocker due to reduced LV systolic function, possible addition as outpatient.  Follow up renal function and electrolytes in 7 days as outpatient. Follow up with Dr Charles [name] in 7 to 10 days.   Discharge Diagnoses: Principal Problem:   Acute on chronic systolic CHF (congestive heart failure) (HCC) Active Problems:   Acute on chronic systolic congestive heart failure (HCC)   Polysubstance abuse (HCC)   CKD (chronic kidney disease), stage II   Thrombocytopenia (HCC)   Chronic hepatitis C (HCC)   COPD (chronic obstructive pulmonary disease) (HCC)   Homelessness   Prolonged QT interval  Resolved Problems:   * No resolved hospital problems. [facility] Course: Charles [name] was admitted to the hospital with the working diagnosis of heart failure exacerbation.   51/homeless male with history of chronic systolic CHF, polysubstance abuse, chronic hep C, CKD 3A, hypertension presented to the ED with shortness of breath for a few days, he does not take Lasix, recently seen in the ED with dyspnea on exertion and prescribed Lasix daily, reports last cocaine use from before. On his initial physical examination his blood pressure was 141/109, HR 88, RR 17 and 02 saturation 100%, lungs with no wheezing, positive rales, heart with S1 and S2 present and regular with no gallops or rubs, abdomen with no distention, no lower extremity edema.   Na 138, K 4,6 Cl 105 bicarbonate 20, bun 22 cr 1,37  Mg 2.2  BNP 1,887  High sensitive troponin 46 and 49  Wbc 3.0 hgb 14.9 plt 132  D dimer 0,93  SARS covid 19 negative    Chest radiograph with cardiomegaly and bilateral hilar vascular congestion.   CT chest with apical emphysema bilaterally, bilateral ground glass opacities, with 4 mm right upper nodule. No evidence of pulmonary embolism.   EKG 83 bpm, left axis deviation, left anterior fascicular block, qtc 507, sinus rhythm with biatrial enlargement, no significant ST segment changes, negative T wave lead I, AVL, V2, V3, V5, V6. Positive LVH.   Patient was placed on furosemide for diuresis.   09/20 Volume status has improved, patient has been advised about compliance with his medications and follow up as outpatient.   Assessment and Plan: Acute on chronic systolic congestive heart failure (HCC) Echocardiogram with reduced LV systolic function with EF 25 to 30%, global hypokinesis, moderate left ventricular hypertrophy, mild reduction in RV systolic function, no significant valvular disease.   Elevated troponin due to heart failure exacerbation, ruled out for acute coronary syndrome.   Patient was placed on IV furosemide for diuresis, negative fluid balance was achieved, -3,354 ml, with significant improvement in his symptoms.   Heart failure guideline medical therapy with empagliflozin, spironolactone, losartan.   Loop diuretic therapy with furosemide.   Holding on B blocker due to reduced LV systolic function with acute exacerbation.   Patient will need close follow up as outpatient, will send his medication to transition [facility] care pharmacy.   CKD (chronic kidney disease), stage II AKI Hyponatremia.   His volume status has improved, at the time of his discharge his renal function had a serum cr  Physician Discharge Summary   Patient: Charles [name] MRN: [id number] DOB: 1971-02-23  Admit date:     01/27/2023  Discharge date: 01/31/23  Discharge Physician: Charles [name]   PCP: Charles Rakers, MD   Recommendations at discharge:    Patient will continue heart failure guideline directed medical therapy with losartan, SGLT 2 inh, spironolactone and diuresis with furosemide. No B blocker due to reduced LV systolic function, possible addition as outpatient.  Follow up renal function and electrolytes in 7 days as outpatient. Follow up with Dr Charles [name] in 7 to 10 days.   Discharge Diagnoses: Principal Problem:   Acute on chronic systolic CHF (congestive heart failure) (HCC) Active Problems:   Acute on chronic systolic congestive heart failure (HCC)   Polysubstance abuse (HCC)   CKD (chronic kidney disease), stage II   Thrombocytopenia (HCC)   Chronic hepatitis C (HCC)   COPD (chronic obstructive pulmonary disease) (HCC)   Homelessness   Prolonged QT interval  Resolved Problems:   * No resolved hospital problems. [facility] Course: Charles [name] was admitted to the hospital with the working diagnosis of heart failure exacerbation.   51/homeless male with history of chronic systolic CHF, polysubstance abuse, chronic hep C, CKD 3A, hypertension presented to the ED with shortness of breath for a few days, he does not take Lasix, recently seen in the ED with dyspnea on exertion and prescribed Lasix daily, reports last cocaine use from before. On his initial physical examination his blood pressure was 141/109, HR 88, RR 17 and 02 saturation 100%, lungs with no wheezing, positive rales, heart with S1 and S2 present and regular with no gallops or rubs, abdomen with no distention, no lower extremity edema.   Na 138, K 4,6 Cl 105 bicarbonate 20, bun 22 cr 1,37  Mg 2.2  BNP 1,887  High sensitive troponin 46 and 49  Wbc 3.0 hgb 14.9 plt 132  D dimer 0,93  SARS covid 19 negative    Chest radiograph with cardiomegaly and bilateral hilar vascular congestion.   CT chest with apical emphysema bilaterally, bilateral ground glass opacities, with 4 mm right upper nodule. No evidence of pulmonary embolism.   EKG 83 bpm, left axis deviation, left anterior fascicular block, qtc 507, sinus rhythm with biatrial enlargement, no significant ST segment changes, negative T wave lead I, AVL, V2, V3, V5, V6. Positive LVH.   Patient was placed on furosemide for diuresis.   09/20 Volume status has improved, patient has been advised about compliance with his medications and follow up as outpatient.   Assessment and Plan: Acute on chronic systolic congestive heart failure (HCC) Echocardiogram with reduced LV systolic function with EF 25 to 30%, global hypokinesis, moderate left ventricular hypertrophy, mild reduction in RV systolic function, no significant valvular disease.   Elevated troponin due to heart failure exacerbation, ruled out for acute coronary syndrome.   Patient was placed on IV furosemide for diuresis, negative fluid balance was achieved, -3,354 ml, with significant improvement in his symptoms.   Heart failure guideline medical therapy with empagliflozin, spironolactone, losartan.   Loop diuretic therapy with furosemide.   Holding on B blocker due to reduced LV systolic function with acute exacerbation.   Patient will need close follow up as outpatient, will send his medication to [facility] care pharmacy.   CKD (chronic kidney disease), stage II AKI Hyponatremia.   His volume status has improved, at the time of his discharge his renal function had a serum cr  of 1.34 with K at 4.4 and serum bicarbonate at 25. Na 131 and Mg 2.3  Continue with spironolactone and empagliflozin. Loop diuretic with furosemide   Polysubstance abuse (HCC) Positive cocaine on admission.  Tobacco abuse, smoking cessation.    Thrombocytopenia (HCC) Stable plt at 118   Chronic  hepatitis C (HCC) No clinical signs of liver failure. Plan to follow up as outpatient.   COPD (chronic obstructive pulmonary disease) (HCC) No clinical signs of exacerbation. Continue bronchodilator therapy. Follow up pulmonary nodule as outpatient.          Consultants: none  Procedures performed: none   Disposition: Home Diet recommendation:  Cardiac diet DISCHARGE MEDICATION: Allergies as of 01/31/2023       Reactions   Egg-derived Products Nausea And Vomiting   Banana Nausea And Vomiting   Pork-derived Products Nausea And Vomiting        Medication List     STOP taking these medications    diclofenac Sodium 1 % Gel Commonly known as: Voltaren       TAKE these medications    empagliflozin 10 MG Tabs tablet Commonly known as: Jardiance Take 1 tablet (10 mg total) by mouth daily before breakfast.   furosemide 40 MG tablet Commonly known as: LASIX Take 1 tablet (40 mg total) by mouth daily.   losartan 25 MG tablet Commonly known as: COZAAR Take 1 tablet (25 mg total) by mouth daily. Start taking on: February 01, 2023 What changed:  medication strength how much to take   spironolactone 25 MG tablet Commonly known as: ALDACTONE Take 1 tablet (25 mg total) by mouth daily.        Follow-up Information     [facility] Follow up.   Contact information: [address] [place] [address] [phone]        Charles Rakers, MD Follow up.   Specialty: Family Medicine Why: Pt has appointment Oct.8, 2024 @3 :00 with [facility].  Dr. Parke [name] no longer PCP. Contact information: [address] [place] [address] [phone]                Discharge Exam: Filed Weights   01/29/23 0407 01/30/23 0509 01/31/23 0606  Weight: 66.9 kg 66.8 kg 69.4 kg   BP (!) 126/96 (BP Location: Right Arm)   Pulse 77   Temp 97.8 F (36.6 C) (Oral)   Resp 18   Ht 5\' 6"  (1.676 m)   Wt 69.4  kg   SpO2 99%   BMI 24.68 kg/m   Patient is feeling better, with no dyspnea, PND or orthopnea. Reports feeling weak.   Neurology awake and alert ENT with no pallor Cardiovascular with S1 and S2 present and regular with no gallops, rubs or murmurs No JVD No lower extremity edema Respiratory with no rales or wheezing, no rhonchi Abdomen with no distention   Condition at discharge: stable  The results of significant diagnostics from this hospitalization (including imaging, microbiology, ancillary and laboratory) are listed below for reference.   Imaging Studies: ECHOCARDIOGRAM COMPLETE  Result Date: 01/28/2023    ECHOCARDIOGRAM REPORT   Patient Name:   Charles [name] Date of Exam: 01/28/2023 Medical Rec #:  [id number]       Height:       66.0 in Accession #:    [id number]      Weight:       149.9 lb Date of Birth:  1970-09-11  Physician Discharge Summary   Patient: Charles [name] MRN: [id number] DOB: 1971-02-23  Admit date:     01/27/2023  Discharge date: 01/31/23  Discharge Physician: Charles [name]   PCP: Charles Rakers, MD   Recommendations at discharge:    Patient will continue heart failure guideline directed medical therapy with losartan, SGLT 2 inh, spironolactone and diuresis with furosemide. No B blocker due to reduced LV systolic function, possible addition as outpatient.  Follow up renal function and electrolytes in 7 days as outpatient. Follow up with Dr Charles [name] in 7 to 10 days.   Discharge Diagnoses: Principal Problem:   Acute on chronic systolic CHF (congestive heart failure) (HCC) Active Problems:   Acute on chronic systolic congestive heart failure (HCC)   Polysubstance abuse (HCC)   CKD (chronic kidney disease), stage II   Thrombocytopenia (HCC)   Chronic hepatitis C (HCC)   COPD (chronic obstructive pulmonary disease) (HCC)   Homelessness   Prolonged QT interval  Resolved Problems:   * No resolved hospital problems. [facility] Course: Charles [name] was admitted to the hospital with the working diagnosis of heart failure exacerbation.   51/homeless male with history of chronic systolic CHF, polysubstance abuse, chronic hep C, CKD 3A, hypertension presented to the ED with shortness of breath for a few days, he does not take Lasix, recently seen in the ED with dyspnea on exertion and prescribed Lasix daily, reports last cocaine use from before. On his initial physical examination his blood pressure was 141/109, HR 88, RR 17 and 02 saturation 100%, lungs with no wheezing, positive rales, heart with S1 and S2 present and regular with no gallops or rubs, abdomen with no distention, no lower extremity edema.   Na 138, K 4,6 Cl 105 bicarbonate 20, bun 22 cr 1,37  Mg 2.2  BNP 1,887  High sensitive troponin 46 and 49  Wbc 3.0 hgb 14.9 plt 132  D dimer 0,93  SARS covid 19 negative    Chest radiograph with cardiomegaly and bilateral hilar vascular congestion.   CT chest with apical emphysema bilaterally, bilateral ground glass opacities, with 4 mm right upper nodule. No evidence of pulmonary embolism.   EKG 83 bpm, left axis deviation, left anterior fascicular block, qtc 507, sinus rhythm with biatrial enlargement, no significant ST segment changes, negative T wave lead I, AVL, V2, V3, V5, V6. Positive LVH.   Patient was placed on furosemide for diuresis.   09/20 Volume status has improved, patient has been advised about compliance with his medications and follow up as outpatient.   Assessment and Plan: Acute on chronic systolic congestive heart failure (HCC) Echocardiogram with reduced LV systolic function with EF 25 to 30%, global hypokinesis, moderate left ventricular hypertrophy, mild reduction in RV systolic function, no significant valvular disease.   Elevated troponin due to heart failure exacerbation, ruled out for acute coronary syndrome.   Patient was placed on IV furosemide for diuresis, negative fluid balance was achieved, -3,354 ml, with significant improvement in his symptoms.   Heart failure guideline medical therapy with empagliflozin, spironolactone, losartan.   Loop diuretic therapy with furosemide.   Holding on B blocker due to reduced LV systolic function with acute exacerbation.   Patient will need close follow up as outpatient, will send his medication to [facility] care pharmacy.   CKD (chronic kidney disease), stage II AKI Hyponatremia.   His volume status has improved, at the time of his discharge his renal function had a serum cr  Physician Discharge Summary   Patient: Charles [name] MRN: [id number] DOB: 1971-02-23  Admit date:     01/27/2023  Discharge date: 01/31/23  Discharge Physician: Charles [name]   PCP: Charles Rakers, MD   Recommendations at discharge:    Patient will continue heart failure guideline directed medical therapy with losartan, SGLT 2 inh, spironolactone and diuresis with furosemide. No B blocker due to reduced LV systolic function, possible addition as outpatient.  Follow up renal function and electrolytes in 7 days as outpatient. Follow up with Dr Charles [name] in 7 to 10 days.   Discharge Diagnoses: Principal Problem:   Acute on chronic systolic CHF (congestive heart failure) (HCC) Active Problems:   Acute on chronic systolic congestive heart failure (HCC)   Polysubstance abuse (HCC)   CKD (chronic kidney disease), stage II   Thrombocytopenia (HCC)   Chronic hepatitis C (HCC)   COPD (chronic obstructive pulmonary disease) (HCC)   Homelessness   Prolonged QT interval  Resolved Problems:   * No resolved hospital problems. [facility] Course: Charles [name] was admitted to the hospital with the working diagnosis of heart failure exacerbation.   51/homeless male with history of chronic systolic CHF, polysubstance abuse, chronic hep C, CKD 3A, hypertension presented to the ED with shortness of breath for a few days, he does not take Lasix, recently seen in the ED with dyspnea on exertion and prescribed Lasix daily, reports last cocaine use from before. On his initial physical examination his blood pressure was 141/109, HR 88, RR 17 and 02 saturation 100%, lungs with no wheezing, positive rales, heart with S1 and S2 present and regular with no gallops or rubs, abdomen with no distention, no lower extremity edema.   Na 138, K 4,6 Cl 105 bicarbonate 20, bun 22 cr 1,37  Mg 2.2  BNP 1,887  High sensitive troponin 46 and 49  Wbc 3.0 hgb 14.9 plt 132  D dimer 0,93  SARS covid 19 negative    Chest radiograph with cardiomegaly and bilateral hilar vascular congestion.   CT chest with apical emphysema bilaterally, bilateral ground glass opacities, with 4 mm right upper nodule. No evidence of pulmonary embolism.   EKG 83 bpm, left axis deviation, left anterior fascicular block, qtc 507, sinus rhythm with biatrial enlargement, no significant ST segment changes, negative T wave lead I, AVL, V2, V3, V5, V6. Positive LVH.   Patient was placed on furosemide for diuresis.   09/20 Volume status has improved, patient has been advised about compliance with his medications and follow up as outpatient.   Assessment and Plan: Acute on chronic systolic congestive heart failure (HCC) Echocardiogram with reduced LV systolic function with EF 25 to 30%, global hypokinesis, moderate left ventricular hypertrophy, mild reduction in RV systolic function, no significant valvular disease.   Elevated troponin due to heart failure exacerbation, ruled out for acute coronary syndrome.   Patient was placed on IV furosemide for diuresis, negative fluid balance was achieved, -3,354 ml, with significant improvement in his symptoms.   Heart failure guideline medical therapy with empagliflozin, spironolactone, losartan.   Loop diuretic therapy with furosemide.   Holding on B blocker due to reduced LV systolic function with acute exacerbation.   Patient will need close follow up as outpatient, will send his medication to [facility] care pharmacy.   CKD (chronic kidney disease), stage II AKI Hyponatremia.   His volume status has improved, at the time of his discharge his renal function had a serum cr  of 1.34 with K at 4.4 and serum bicarbonate at 25. Na 131 and Mg 2.3  Continue with spironolactone and empagliflozin. Loop diuretic with furosemide   Polysubstance abuse (HCC) Positive cocaine on admission.  Tobacco abuse, smoking cessation.    Thrombocytopenia (HCC) Stable plt at 118   Chronic  hepatitis C (HCC) No clinical signs of liver failure. Plan to follow up as outpatient.   COPD (chronic obstructive pulmonary disease) (HCC) No clinical signs of exacerbation. Continue bronchodilator therapy. Follow up pulmonary nodule as outpatient.          Consultants: none  Procedures performed: none   Disposition: Home Diet recommendation:  Cardiac diet DISCHARGE MEDICATION: Allergies as of 01/31/2023       Reactions   Egg-derived Products Nausea And Vomiting   Banana Nausea And Vomiting   Pork-derived Products Nausea And Vomiting        Medication List     STOP taking these medications    diclofenac Sodium 1 % Gel Commonly known as: Voltaren       TAKE these medications    empagliflozin 10 MG Tabs tablet Commonly known as: Jardiance Take 1 tablet (10 mg total) by mouth daily before breakfast.   furosemide 40 MG tablet Commonly known as: LASIX Take 1 tablet (40 mg total) by mouth daily.   losartan 25 MG tablet Commonly known as: COZAAR Take 1 tablet (25 mg total) by mouth daily. Start taking on: February 01, 2023 What changed:  medication strength how much to take   spironolactone 25 MG tablet Commonly known as: ALDACTONE Take 1 tablet (25 mg total) by mouth daily.        Follow-up Information     [facility] Follow up.   Contact information: [address] [place] [address] [phone]        Charles Rakers, MD Follow up.   Specialty: Family Medicine Why: Pt has appointment Oct.8, 2024 @3 :00 with [facility].  Dr. Parke [name] no longer PCP. Contact information: [address] [place] [address] [phone]                Discharge Exam: Filed Weights   01/29/23 0407 01/30/23 0509 01/31/23 0606  Weight: 66.9 kg 66.8 kg 69.4 kg   BP (!) 126/96 (BP Location: Right Arm)   Pulse 77   Temp 97.8 F (36.6 C) (Oral)   Resp 18   Ht 5\' 6"  (1.676 m)   Wt 69.4  kg   SpO2 99%   BMI 24.68 kg/m   Patient is feeling better, with no dyspnea, PND or orthopnea. Reports feeling weak.   Neurology awake and alert ENT with no pallor Cardiovascular with S1 and S2 present and regular with no gallops, rubs or murmurs No JVD No lower extremity edema Respiratory with no rales or wheezing, no rhonchi Abdomen with no distention   Condition at discharge: stable  The results of significant diagnostics from this hospitalization (including imaging, microbiology, ancillary and laboratory) are listed below for reference.   Imaging Studies: ECHOCARDIOGRAM COMPLETE  Result Date: 01/28/2023    ECHOCARDIOGRAM REPORT   Patient Name:   Charles [name] Date of Exam: 01/28/2023 Medical Rec #:  [id number]       Height:       66.0 in Accession #:    [id number]      Weight:       149.9 lb Date of Birth:  1970-09-11

## 2023-02-12 ENCOUNTER — Other Ambulatory Visit (HOSPITAL_COMMUNITY): Payer: Self-pay

## 2023-02-18 ENCOUNTER — Encounter: Payer: Medicare HMO | Admitting: Student

## 2023-02-21 ENCOUNTER — Inpatient Hospital Stay (HOSPITAL_COMMUNITY)
Admission: EM | Admit: 2023-02-21 | Discharge: 2023-02-25 | DRG: 291 | Disposition: A | Discharge status: Home / Self Care | Payer: Medicare HMO | Attending: Internal Medicine | Admitting: Internal Medicine

## 2023-02-21 ENCOUNTER — Other Ambulatory Visit: Payer: Self-pay

## 2023-02-21 ENCOUNTER — Emergency Department (HOSPITAL_COMMUNITY): Payer: Medicare HMO

## 2023-02-21 ENCOUNTER — Encounter (HOSPITAL_COMMUNITY): Payer: Self-pay | Admitting: Emergency Medicine

## 2023-02-21 DIAGNOSIS — Z800 Family history of malignant neoplasm of digestive organs: Secondary | ICD-10-CM | POA: Diagnosis not present

## 2023-02-21 DIAGNOSIS — I5023 Acute on chronic systolic (congestive) heart failure: Principal | ICD-10-CM | POA: Diagnosis present

## 2023-02-21 DIAGNOSIS — I130 Hypertensive heart and chronic kidney disease with heart failure and stage 1 through stage 4 chronic kidney disease, or unspecified chronic kidney disease: Secondary | ICD-10-CM | POA: Diagnosis not present

## 2023-02-21 DIAGNOSIS — Z720 Tobacco use: Secondary | ICD-10-CM | POA: Diagnosis present

## 2023-02-21 DIAGNOSIS — Z7984 Long term (current) use of oral hypoglycemic drugs: Secondary | ICD-10-CM

## 2023-02-21 DIAGNOSIS — N1831 Chronic kidney disease, stage 3a: Secondary | ICD-10-CM | POA: Diagnosis not present

## 2023-02-21 DIAGNOSIS — I10 Essential (primary) hypertension: Secondary | ICD-10-CM | POA: Diagnosis not present

## 2023-02-21 DIAGNOSIS — Z801 Family history of malignant neoplasm of trachea, bronchus and lung: Secondary | ICD-10-CM

## 2023-02-21 DIAGNOSIS — T501X6A Underdosing of loop [high-ceiling] diuretics, initial encounter: Secondary | ICD-10-CM | POA: Diagnosis present

## 2023-02-21 DIAGNOSIS — F17210 Nicotine dependence, cigarettes, uncomplicated: Secondary | ICD-10-CM | POA: Diagnosis present

## 2023-02-21 DIAGNOSIS — I110 Hypertensive heart disease with heart failure: Secondary | ICD-10-CM | POA: Diagnosis not present

## 2023-02-21 DIAGNOSIS — J440 Chronic obstructive pulmonary disease with acute lower respiratory infection: Secondary | ICD-10-CM | POA: Diagnosis not present

## 2023-02-21 DIAGNOSIS — I2489 Other forms of acute ischemic heart disease: Secondary | ICD-10-CM | POA: Diagnosis present

## 2023-02-21 DIAGNOSIS — I517 Cardiomegaly: Secondary | ICD-10-CM | POA: Diagnosis not present

## 2023-02-21 DIAGNOSIS — J42 Unspecified chronic bronchitis: Secondary | ICD-10-CM | POA: Diagnosis not present

## 2023-02-21 DIAGNOSIS — D696 Thrombocytopenia, unspecified: Secondary | ICD-10-CM | POA: Diagnosis present

## 2023-02-21 DIAGNOSIS — F1410 Cocaine abuse, uncomplicated: Secondary | ICD-10-CM | POA: Diagnosis not present

## 2023-02-21 DIAGNOSIS — R0602 Shortness of breath: Secondary | ICD-10-CM | POA: Diagnosis not present

## 2023-02-21 DIAGNOSIS — J449 Chronic obstructive pulmonary disease, unspecified: Secondary | ICD-10-CM | POA: Diagnosis present

## 2023-02-21 DIAGNOSIS — D539 Nutritional anemia, unspecified: Secondary | ICD-10-CM | POA: Diagnosis present

## 2023-02-21 DIAGNOSIS — N183 Chronic kidney disease, stage 3 unspecified: Secondary | ICD-10-CM | POA: Diagnosis present

## 2023-02-21 DIAGNOSIS — Z91128 Patient's intentional underdosing of medication regimen for other reason: Secondary | ICD-10-CM | POA: Diagnosis not present

## 2023-02-21 DIAGNOSIS — R7989 Other specified abnormal findings of blood chemistry: Secondary | ICD-10-CM | POA: Diagnosis present

## 2023-02-21 DIAGNOSIS — J209 Acute bronchitis, unspecified: Secondary | ICD-10-CM | POA: Diagnosis present

## 2023-02-21 DIAGNOSIS — N1832 Chronic kidney disease, stage 3b: Secondary | ICD-10-CM | POA: Diagnosis present

## 2023-02-21 DIAGNOSIS — Z1152 Encounter for screening for COVID-19: Secondary | ICD-10-CM

## 2023-02-21 DIAGNOSIS — B182 Chronic viral hepatitis C: Secondary | ICD-10-CM | POA: Diagnosis not present

## 2023-02-21 DIAGNOSIS — T465X6A Underdosing of other antihypertensive drugs, initial encounter: Secondary | ICD-10-CM | POA: Diagnosis present

## 2023-02-21 DIAGNOSIS — T500X6A Underdosing of mineralocorticoids and their antagonists, initial encounter: Secondary | ICD-10-CM | POA: Diagnosis present

## 2023-02-21 DIAGNOSIS — I160 Hypertensive urgency: Secondary | ICD-10-CM | POA: Diagnosis present

## 2023-02-21 DIAGNOSIS — Z79899 Other long term (current) drug therapy: Secondary | ICD-10-CM | POA: Diagnosis not present

## 2023-02-21 DIAGNOSIS — Z5900 Homelessness unspecified: Secondary | ICD-10-CM

## 2023-02-21 LAB — TROPONIN I (HIGH SENSITIVITY): Troponin I (High Sensitivity): 62 ng/L — ABNORMAL HIGH (ref <18)

## 2023-02-21 LAB — BASIC METABOLIC PANEL
Anion gap: 11 (ref 5–15)
BUN: 20 mg/dL (ref 6–20)
CO2: 22 mmol/L (ref 22–32)
Calcium: 9.2 mg/dL (ref 8.9–10.3)
Chloride: 107 mmol/L (ref 98–111)
Creatinine, Ser: 1.48 mg/dL — ABNORMAL HIGH (ref 0.61–1.24)
GFR, Estimated: 57 mL/min — ABNORMAL LOW (ref >60)
Glucose, Bld: 121 mg/dL — ABNORMAL HIGH (ref 70–99)
Potassium: 3.9 mmol/L (ref 3.5–5.1)
Sodium: 140 mmol/L (ref 135–145)

## 2023-02-21 LAB — CBC
HCT: 39.2 % (ref 39.0–52.0)
Hemoglobin: 12.9 g/dL — ABNORMAL LOW (ref 13.0–17.0)
MCH: 33.9 pg (ref 26.0–34.0)
MCHC: 32.9 g/dL (ref 30.0–36.0)
MCV: 103.2 fL — ABNORMAL HIGH (ref 80.0–100.0)
Platelets: 149 10*3/uL — ABNORMAL LOW (ref 150–400)
RBC: 3.8 MIL/uL — ABNORMAL LOW (ref 4.22–5.81)
RDW: 16.6 % — ABNORMAL HIGH (ref 11.5–15.5)
WBC: 4 10*3/uL (ref 4.0–10.5)
nRBC: 0 % (ref 0.0–0.2)

## 2023-02-21 LAB — RESP PANEL BY RT-PCR (RSV, FLU A&B, COVID)  RVPGX2
Influenza A by PCR: NEGATIVE
Influenza B by PCR: NEGATIVE
Resp Syncytial Virus by PCR: NEGATIVE
SARS Coronavirus 2 by RT PCR: NEGATIVE

## 2023-02-21 NOTE — ED Provider Triage Note (Signed)
Emergency Medicine Provider Triage Evaluation Note  Sparrow Sanzo , a 52 y.o. male  was evaluated in triage.  Pt complains of nasal congestion, cough, shortness of breath and flulike symptoms.  Review of Systems  Positive: Cough Negative: Fever  Physical Exam  BP (!) 173/102 (BP Location: Right Arm)   Pulse 94   Temp 99.2 F (37.3 C) (Oral)   Resp 18   Ht 5\' 6"  (1.676 m)   Wt 69 kg   SpO2 94%   BMI 24.55 kg/m  Gen:   Awake, no distress   Resp:  Normal effort  MSK:   Moves extremities without difficulty  Other:    Medical Decision Making  Medically screening exam initiated at 9:12 PM.  Appropriate orders placed.  Antwon Rochin was informed that the remainder of the evaluation will be completed by another provider, this initial triage assessment does not replace that evaluation, and the importance of remaining in the ED until their evaluation is complete.     Arthor Captain, PA-C 02/21/23 2115

## 2023-02-21 NOTE — ED Triage Notes (Signed)
Patient BIB [facility] with c/o congestion, feeling like fluid is on his lungs since last being seen at the hospital 2 weeks ago. Patient w/ hx of CHF not compliant with medications.

## 2023-02-22 DIAGNOSIS — F17210 Nicotine dependence, cigarettes, uncomplicated: Secondary | ICD-10-CM | POA: Diagnosis present

## 2023-02-22 DIAGNOSIS — I160 Hypertensive urgency: Secondary | ICD-10-CM

## 2023-02-22 DIAGNOSIS — D696 Thrombocytopenia, unspecified: Secondary | ICD-10-CM | POA: Diagnosis not present

## 2023-02-22 DIAGNOSIS — R7989 Other specified abnormal findings of blood chemistry: Secondary | ICD-10-CM | POA: Diagnosis not present

## 2023-02-22 DIAGNOSIS — N183 Chronic kidney disease, stage 3 unspecified: Secondary | ICD-10-CM | POA: Diagnosis present

## 2023-02-22 DIAGNOSIS — Z79899 Other long term (current) drug therapy: Secondary | ICD-10-CM | POA: Diagnosis not present

## 2023-02-22 DIAGNOSIS — N1831 Chronic kidney disease, stage 3a: Secondary | ICD-10-CM | POA: Diagnosis present

## 2023-02-22 DIAGNOSIS — T465X6A Underdosing of other antihypertensive drugs, initial encounter: Secondary | ICD-10-CM | POA: Diagnosis present

## 2023-02-22 DIAGNOSIS — T500X6A Underdosing of mineralocorticoids and their antagonists, initial encounter: Secondary | ICD-10-CM | POA: Diagnosis present

## 2023-02-22 DIAGNOSIS — D539 Nutritional anemia, unspecified: Secondary | ICD-10-CM | POA: Diagnosis not present

## 2023-02-22 DIAGNOSIS — B182 Chronic viral hepatitis C: Secondary | ICD-10-CM | POA: Diagnosis not present

## 2023-02-22 DIAGNOSIS — Z5900 Homelessness unspecified: Secondary | ICD-10-CM

## 2023-02-22 DIAGNOSIS — Z7984 Long term (current) use of oral hypoglycemic drugs: Secondary | ICD-10-CM | POA: Diagnosis not present

## 2023-02-22 DIAGNOSIS — I5023 Acute on chronic systolic (congestive) heart failure: Secondary | ICD-10-CM | POA: Diagnosis not present

## 2023-02-22 DIAGNOSIS — F1410 Cocaine abuse, uncomplicated: Secondary | ICD-10-CM

## 2023-02-22 DIAGNOSIS — I130 Hypertensive heart and chronic kidney disease with heart failure and stage 1 through stage 4 chronic kidney disease, or unspecified chronic kidney disease: Secondary | ICD-10-CM | POA: Diagnosis present

## 2023-02-22 DIAGNOSIS — Z800 Family history of malignant neoplasm of digestive organs: Secondary | ICD-10-CM | POA: Diagnosis not present

## 2023-02-22 DIAGNOSIS — J42 Unspecified chronic bronchitis: Secondary | ICD-10-CM

## 2023-02-22 DIAGNOSIS — J209 Acute bronchitis, unspecified: Secondary | ICD-10-CM | POA: Diagnosis present

## 2023-02-22 DIAGNOSIS — Z801 Family history of malignant neoplasm of trachea, bronchus and lung: Secondary | ICD-10-CM | POA: Diagnosis not present

## 2023-02-22 DIAGNOSIS — Z720 Tobacco use: Secondary | ICD-10-CM

## 2023-02-22 DIAGNOSIS — T501X6A Underdosing of loop [high-ceiling] diuretics, initial encounter: Secondary | ICD-10-CM | POA: Diagnosis present

## 2023-02-22 DIAGNOSIS — J440 Chronic obstructive pulmonary disease with acute lower respiratory infection: Secondary | ICD-10-CM | POA: Diagnosis present

## 2023-02-22 DIAGNOSIS — I2489 Other forms of acute ischemic heart disease: Secondary | ICD-10-CM | POA: Diagnosis present

## 2023-02-22 DIAGNOSIS — Z1152 Encounter for screening for COVID-19: Secondary | ICD-10-CM | POA: Diagnosis not present

## 2023-02-22 DIAGNOSIS — N1832 Chronic kidney disease, stage 3b: Secondary | ICD-10-CM | POA: Diagnosis present

## 2023-02-22 DIAGNOSIS — Z91128 Patient's intentional underdosing of medication regimen for other reason: Secondary | ICD-10-CM | POA: Diagnosis not present

## 2023-02-22 LAB — BRAIN NATRIURETIC PEPTIDE: B Natriuretic Peptide: 2251.3 pg/mL — ABNORMAL HIGH (ref 0.0–100.0)

## 2023-02-22 LAB — TROPONIN I (HIGH SENSITIVITY): Troponin I (High Sensitivity): 68 ng/L — ABNORMAL HIGH (ref <18)

## 2023-02-22 MED ORDER — IPRATROPIUM-ALBUTEROL 0.5-2.5 (3) MG/3ML IN SOLN
3.0000 mL | Freq: Once | RESPIRATORY_TRACT | Status: AC
  Administered 2023-02-22: 3 mL via RESPIRATORY_TRACT
  Filled 2023-02-22: qty 3

## 2023-02-22 MED ORDER — GUAIFENESIN ER 600 MG PO TB12
600.0000 mg | ORAL_TABLET | Freq: Two times a day (BID) | ORAL | Status: DC
  Administered 2023-02-22 – 2023-02-25 (×7): 600 mg via ORAL
  Filled 2023-02-22 (×7): qty 1

## 2023-02-22 MED ORDER — LORAZEPAM 2 MG/ML IJ SOLN
0.5000 mg | Freq: Once | INTRAMUSCULAR | Status: AC
  Administered 2023-02-22: 0.5 mg via INTRAVENOUS
  Filled 2023-02-22: qty 1

## 2023-02-22 MED ORDER — ACETAMINOPHEN 650 MG RE SUPP: 650.0000 mg | Freq: Four times a day (QID) | RECTAL | Status: DC | PRN

## 2023-02-22 MED ORDER — NICOTINE 21 MG/24HR TD PT24
21.0000 mg | MEDICATED_PATCH | Freq: Every day | TRANSDERMAL | Status: DC
  Administered 2023-02-22 – 2023-02-25 (×4): 21 mg via TRANSDERMAL
  Filled 2023-02-22 (×4): qty 1

## 2023-02-22 MED ORDER — FUROSEMIDE 10 MG/ML IJ SOLN
40.0000 mg | Freq: Once | INTRAMUSCULAR | Status: AC
  Administered 2023-02-22: 40 mg via INTRAVENOUS
  Filled 2023-02-22: qty 4

## 2023-02-22 MED ORDER — METHYLPREDNISOLONE SODIUM SUCC 125 MG IJ SOLR
125.0000 mg | Freq: Once | INTRAMUSCULAR | Status: AC
  Administered 2023-02-22: 125 mg via INTRAVENOUS
  Filled 2023-02-22: qty 2

## 2023-02-22 MED ORDER — LOSARTAN POTASSIUM 25 MG PO TABS
25.0000 mg | ORAL_TABLET | Freq: Every day | ORAL | Status: DC
  Administered 2023-02-22 – 2023-02-25 (×4): 25 mg via ORAL
  Filled 2023-02-22 (×4): qty 1

## 2023-02-22 MED ORDER — HYDRALAZINE HCL 20 MG/ML IJ SOLN
10.0000 mg | INTRAMUSCULAR | Status: DC | PRN
  Administered 2023-02-22: 10 mg via INTRAVENOUS
  Filled 2023-02-22: qty 1

## 2023-02-22 MED ORDER — FUROSEMIDE 10 MG/ML IJ SOLN
40.0000 mg | Freq: Two times a day (BID) | INTRAMUSCULAR | Status: DC
  Administered 2023-02-22 – 2023-02-23 (×2): 40 mg via INTRAVENOUS
  Filled 2023-02-22 (×2): qty 4

## 2023-02-22 MED ORDER — HYDRALAZINE HCL 20 MG/ML IJ SOLN
10.0000 mg | Freq: Once | INTRAMUSCULAR | Status: AC
  Administered 2023-02-22: 10 mg via INTRAVENOUS
  Filled 2023-02-22: qty 1

## 2023-02-22 MED ORDER — ALBUTEROL SULFATE (2.5 MG/3ML) 0.083% IN NEBU: 2.5000 mg | INHALATION_SOLUTION | RESPIRATORY_TRACT | Status: DC | PRN

## 2023-02-22 MED ORDER — LORAZEPAM 0.5 MG PO TABS: 0.5000 mg | ORAL_TABLET | Freq: Four times a day (QID) | ORAL | Status: DC | PRN

## 2023-02-22 MED ORDER — ACETAMINOPHEN 325 MG PO TABS
650.0000 mg | ORAL_TABLET | Freq: Four times a day (QID) | ORAL | Status: DC | PRN
  Administered 2023-02-22: 650 mg via ORAL
  Filled 2023-02-22: qty 2

## 2023-02-22 MED ORDER — SODIUM CHLORIDE 0.9% FLUSH
3.0000 mL | Freq: Two times a day (BID) | INTRAVENOUS | Status: DC
  Administered 2023-02-22 – 2023-02-25 (×6): 3 mL via INTRAVENOUS

## 2023-02-22 NOTE — ED Notes (Signed)
Went into room to introduce myself and patient was very rude cussing , stating he hated this place

## 2023-02-22 NOTE — Plan of Care (Signed)
  Problem: Education: Goal: Individualized Educational Video(s) Outcome: Progressing   Problem: Education: Goal: Ability to verbalize understanding of medication therapies will improve Outcome: Progressing   Problem: Education: Goal: Ability to demonstrate management of disease process will improve Outcome: Progressing   Problem: Activity: Goal: Capacity to carry out activities will improve Outcome: Progressing   Problem: Cardiac: Goal: Ability to achieve and maintain adequate cardiopulmonary perfusion will improve Outcome: Progressing   Problem: Education: Goal: Knowledge of General Education information will improve Description: Including pain rating scale, medication(s)/side effects and non-pharmacologic comfort measures Outcome: Progressing   Problem: Health Behavior/Discharge Planning: Goal: Ability to manage health-related needs will improve Outcome: Progressing
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
## 2023-02-22 NOTE — ED Notes (Signed)
Patient refused to get into a hospital gown.

## 2023-02-22 NOTE — ED Provider Notes (Signed)
[place] EMERGENCY DEPARTMENT AT [facility] Provider Note   CSN: [id number] Arrival date & time: 02/21/23  2019     History  Chief Complaint  Patient presents with   Nasal Congestion   Generalized Body Aches    Charles [name] is a 52 y.o. male.  51/homeless male with history of chronic systolic CHF, polysubstance abuse, chronic hep C, CKD 3A, hypertension presenting with 2 to 3-day history of cough, congestion, shortness of breath body aches, chills and nasal congestion.  Some chest pain with coughing.  He was hospitalized about 2 weeks ago for CHF exacerbation and states he has not taken any of his medications since he was discharged.  He does not like taking medications he states he only wants to take them when he is in the hospital.  He says for the past 2 to 3 days has had increased coughing congestion, shortness of breath with cough productive of white mucus.  Last cocaine use was about 3 days ago.  Not having any chest pain currently.  No fever.  No leg pain or leg swelling.  No abdominal pain, nausea or vomiting. States he has not taken any of his prescriptions since he left the hospital in September.  The history is provided by the patient.       Home Medications Prior to Admission medications   Medication Sig Start Date End Date Taking? Authorizing Provider  empagliflozin (JARDIANCE) 10 MG TABS tablet Take 1 tablet (10 mg total) by mouth daily before breakfast. 01/31/23   Arrien, York Ram, MD  furosemide (LASIX) 40 MG tablet Take 1 tablet (40 mg total) by mouth daily. 01/31/23   Arrien, York Ram, MD  losartan (COZAAR) 25 MG tablet Take 1 tablet (25 mg total) by mouth daily. 02/01/23 03/03/23  Arrien, York Ram, MD  spironolactone (ALDACTONE) 25 MG tablet Take 1 tablet (25 mg total) by mouth daily. 01/31/23   Arrien, York Ram, MD      Allergies    Egg-derived products, Banana, and Pork-derived products    Review of Systems   Review  of Systems  Constitutional:  Negative for activity change, appetite change, chills and fever.  HENT:  Positive for congestion and rhinorrhea.   Respiratory:  Positive for cough, chest tightness and shortness of breath.   Cardiovascular:  Positive for chest pain.  Gastrointestinal:  Negative for abdominal pain, nausea and vomiting.  Genitourinary:  Negative for dysuria and hematuria.  Musculoskeletal:  Negative for arthralgias and myalgias.  Skin:  Negative for rash.  Neurological:  Negative for dizziness, weakness and headaches.   all other systems are negative except as noted in the HPI and PMH.    Physical Exam Updated Vital Signs BP (!) 181/131 (BP Location: Left Arm)   Pulse (!) 49   Temp 98.7 F (37.1 C)   Resp (!) 22   Ht 5\' 6"  (1.676 m)   Wt 69 kg   SpO2 100%   BMI 24.55 kg/m  Physical Exam Vitals and nursing note reviewed.  Constitutional:      General: He is not in acute distress.    Appearance: He is well-developed.     Comments: Agitated, tachypneic, speaking in short phrases  HENT:     Head: Normocephalic and atraumatic.     Mouth/Throat:     Pharynx: No oropharyngeal exudate.  Eyes:     Conjunctiva/sclera: Conjunctivae normal.     Pupils: Pupils are equal, round, and reactive to light.  Neck:  Comments: No meningismus. Cardiovascular:     Rate and Rhythm: Regular rhythm. Tachycardia present.     Heart sounds: Normal heart sounds. No murmur heard. Pulmonary:     Effort: Pulmonary effort is normal. No respiratory distress.     Breath sounds: Rhonchi and rales present.     Comments: Diminished breath sounds with rhonchi throughout. Abdominal:     Palpations: Abdomen is soft.     Tenderness: There is no abdominal tenderness. There is no guarding or rebound.  Musculoskeletal:        General: No tenderness. Normal range of motion.     Cervical back: Normal range of motion and neck supple.  Skin:    General: Skin is warm.  Neurological:     Mental  Status: He is alert and oriented to person, place, and time.     Cranial Nerves: No cranial nerve deficit.     Motor: No abnormal muscle tone.     Coordination: Coordination normal.     Comments:  5/5 strength throughout. CN 2-12 intact.Equal grip strength.   Psychiatric:        Behavior: Behavior normal.     ED Results / Procedures / Treatments   Labs (all labs ordered are listed, but only abnormal results are displayed) Labs Reviewed  BASIC METABOLIC PANEL - Abnormal; Notable for the following components:      Result Value   Glucose, Bld 121 (*)    Creatinine, Ser 1.48 (*)    GFR, Estimated 57 (*)    All other components within normal limits  CBC - Abnormal; Notable for the following components:   RBC 3.80 (*)    Hemoglobin 12.9 (*)    MCV 103.2 (*)    RDW 16.6 (*)    Platelets 149 (*)    All other components within normal limits  BRAIN NATRIURETIC PEPTIDE - Abnormal; Notable for the following components:   B Natriuretic Peptide 2,251.3 (*)    All other components within normal limits  TROPONIN I (HIGH SENSITIVITY) - Abnormal; Notable for the following components:   Troponin I (High Sensitivity) 62 (*)    All other components within normal limits  TROPONIN I (HIGH SENSITIVITY) - Abnormal; Notable for the following components:   Troponin I (High Sensitivity) 68 (*)    All other components within normal limits  RESP PANEL BY RT-PCR (RSV, FLU A&B, COVID)  [id number]  SARS CORONAVIRUS 2 BY RT PCR    EKG EKG Interpretation Date/Time:  Friday February 21 2023 20:44:04 EDT Ventricular Rate:  88 PR Interval:  148 QRS Duration:  72 QT Interval:  402 QTC Calculation: 486 R Axis:   61  Text Interpretation: Sinus rhythm with occasional Premature ventricular complexes Left ventricular hypertrophy with repolarization abnormality ( Sokolow-Lyon , Cornell product , Romhilt-Estes ) Nonspecific ST abnormality Prolonged QT Abnormal ECG When compared with ECG of 28-Jan-2023 09:43, PREVIOUS  ECG IS PRESENT No significant change was found Confirmed by Glynn Octave [phone]) on 02/22/2023 4:56:32 AM  Radiology DG Chest 2 View  Result Date: 02/21/2023 CLINICAL DATA:  Shortness of breath EXAM: CHEST - 2 VIEW COMPARISON:  01/27/2023 FINDINGS: No acute airspace disease or significant effusion. Mild cardiomegaly. Embolization coil in the left upper quadrant IMPRESSION: Cardiomegaly Electronically Signed   By: Jasmine Pang M.D.   On: 02/21/2023 22:34    Procedures Procedures    Medications Ordered in ED Medications  ipratropium-albuterol (DUONEB) 0.5-2.5 (3) MG/3ML nebulizer solution 3 mL (has no administration in time range)  methylPREDNISolone sodium succinate (SOLU-MEDROL) 125 mg/2 mL injection 125 mg (has no administration in time range)  furosemide (LASIX) injection 40 mg (has no administration in time range)    ED Course/ Medical Decision Making/ A&P                                 Medical Decision Making Amount and/or Complexity of Data Reviewed Labs: ordered. Decision-making details documented in ED Course. Radiology: ordered and independent interpretation performed. Decision-making details documented in ED Course. ECG/medicine tests: ordered and independent interpretation performed. Decision-making details documented in ED Course.  Risk Prescription drug management. Decision regarding hospitalization.   Patient here with increased shortness of breath, cough, congestion, concern for fluid overload.  Noncompliant with his home medications.  On arrival he is saturating 100% but tachypneic and speaking in short phrases.  Coarse rhonchi throughout.  He is given bronchodilators, steroids and IV Lasix.  Chest x-ray shows cardiomegaly without evidence of pulmonary edema.  Patient given IV Lasix as well as some IV hydralazine.  He has been noncompliant with his home blood pressure medications and Lasix.  EF is 25-30%.  BNP elevated.  Creatinine at baseline.  Troponin  minimally elevated but flat.  EKG without acute ischemic changes  Patient would benefit from IV diuresis given his increased work of breathing with tachypnea, wheezing and rhonchi.  He is given bronchodilators, steroids and IV Lasix.  Admission d/w Dr. Lazarus Salines        Final Clinical Impression(s) / ED Diagnoses Final diagnoses:  Acute on chronic systolic congestive heart failure [facility])    Rx / DC Orders ED Discharge Orders     None         Kennady Zimmerle, Jeannett Senior, MD 02/22/23 [phone]

## 2023-02-22 NOTE — ED Notes (Signed)
Patient was given graham crackers and peanut butter, ginger ale

## 2023-02-22 NOTE — ED Notes (Signed)
Ice chips given

## 2023-02-22 NOTE — ED Notes (Signed)
Pt began arguing with another pt loudly, using insults and foul language. Security involved at this time

## 2023-02-23 DIAGNOSIS — I5023 Acute on chronic systolic (congestive) heart failure: Secondary | ICD-10-CM | POA: Diagnosis not present

## 2023-02-23 LAB — CBC
HCT: 39.3 % (ref 39.0–52.0)
Hemoglobin: 13.5 g/dL (ref 13.0–17.0)
MCH: 34.1 pg — ABNORMAL HIGH (ref 26.0–34.0)
MCHC: 34.4 g/dL (ref 30.0–36.0)
MCV: 99.2 fL (ref 80.0–100.0)
Platelets: 173 10*3/uL (ref 150–400)
RBC: 3.96 MIL/uL — ABNORMAL LOW (ref 4.22–5.81)
RDW: 16.2 % — ABNORMAL HIGH (ref 11.5–15.5)
WBC: 7.7 10*3/uL (ref 4.0–10.5)
nRBC: 0 % (ref 0.0–0.2)

## 2023-02-23 LAB — BASIC METABOLIC PANEL
Anion gap: 12 (ref 5–15)
BUN: 29 mg/dL — ABNORMAL HIGH (ref 6–20)
CO2: 24 mmol/L (ref 22–32)
Calcium: 9.2 mg/dL (ref 8.9–10.3)
Chloride: 102 mmol/L (ref 98–111)
Creatinine, Ser: 1.53 mg/dL — ABNORMAL HIGH (ref 0.61–1.24)
GFR, Estimated: 55 mL/min — ABNORMAL LOW (ref >60)
Glucose, Bld: 130 mg/dL — ABNORMAL HIGH (ref 70–99)
Potassium: 4.3 mmol/L (ref 3.5–5.1)
Sodium: 138 mmol/L (ref 135–145)

## 2023-02-23 MED ORDER — FUROSEMIDE 10 MG/ML IJ SOLN
40.0000 mg | Freq: Two times a day (BID) | INTRAMUSCULAR | Status: AC
  Administered 2023-02-23: 40 mg via INTRAVENOUS
  Filled 2023-02-23: qty 4

## 2023-02-23 MED ORDER — ALUM & MAG HYDROXIDE-SIMETH 200-200-20 MG/5ML PO SUSP
30.0000 mL | Freq: Four times a day (QID) | ORAL | Status: DC | PRN
  Administered 2023-02-23: 30 mL via ORAL
  Filled 2023-02-23: qty 30

## 2023-02-23 NOTE — Progress Notes (Signed)
PROGRESS NOTE    Charles [name]  VQQ:[id number] DOB: 09/15/1970 DOA: 02/21/2023 PCP: Renaye Rakers, MD  51/ w chronic systolic CHF, CKD 3a, hypertension, chronic hepatitis C, polysubstance abuse (cocaine and tobacco), and homelessness who presents with complaints of not feeling well. -Recently hospitalized 9/16-9/20 with volume overload, diuresed, discharged, reports that he does not take any medications after discharge as he is homeless on the streets and unable to urinate constantly. -In the ED mildly tachypneic, tachycardic, blood pressure 181/131, BNP 2251, troponin 62, hemoglobin 12.9, BUN 20, creatinine 1.48, chest x-ray with cardiomegaly and embolization: Coil in left upper quadrant, flu COVID and RSV PCR were negative  Subjective: -Complains of cough congestion and some shortness of breath  Assessment and Plan:  Acute on chronic systolic CHF -Suspect a component of bronchitis as well as mild fluid overload on account of noncompliance with diuretics -Last echo 9/24 with EF 25-30%, grade 2 DD, mildly reduced RV -Continue IV Lasix 1 more day, losartan resumed -Noncompliance complicated by homelessness continues to be an ongoing challenge in his care  COPD Acute bronchitis -Continue supportive care, flu COVID and RSV PCR were negative -Continue albuterol nebs, Mucinex -Suspect viral URI, hold off on antibiotics at this time   Elevated troponins Chronic.  Secondary to demand, do not suspect ACS   Hypertensive urgency -Moving, continue Lasix, resume losartan   Macrocytic anemia Acute.   -Recheck B12   Chronic kidney disease stage IIIa Stable at baseline, monitor   Thrombocytopenia Chronic.  Platelets noted to be 149.  Records note platelet count is higher than prior check last month. -Continue to monitor   Chronic hep C -Recommend outpatient follow-up with gastroenterology for treatment   Cocaine abuse Patient reports continued cocaine use last reported few days  ago. -Ativan IV as needed for intoxication withdrawal   Tobacco abuse -Nicotine patch offered -Counseled   Homelessness Patient reports living currently on the streets for which he is not taking his medications. -Transitions of care consulted for substance abuse as well as homelessness   DVT prophylaxis: SCDs Advance Care Planning:   Code Status: Full Code    Family Communication: None Disposition Plan: Likely to shelter  Consultants:    Procedures:   Antimicrobials:    Objective: Vitals:   02/22/23 1948 02/22/23 2349 02/23/23 0354 02/23/23 0749  BP: (!) 141/92 (!) 142/81 (!) 165/88 (!) 173/99  Pulse: 85 88 93 (!) 44  Resp: 19 19 18 20   Temp: 97.8 F (36.6 C) 99.1 F (37.3 C) 97.9 F (36.6 C) 98.4 F (36.9 C)  TempSrc: Oral Oral Oral Oral  SpO2: 100% 97% 99% 95%  Weight:   70.7 kg   Height:        Intake/Output Summary (Last 24 hours) at 02/23/2023 1057 Last data filed at 02/23/2023 1000 Gross per 24 hour  Intake 1200 ml  Output 3000 ml  Net -1800 ml   Filed Weights   02/21/23 2032 02/23/23 0354  Weight: 69 kg 70.7 kg    Examination:  General exam: Chronically ill male sitting up in bed, AAOx3 HEENT: No JVD CVS: S1-S2, regular rhythm Lungs: Few basilar Rales Abdomen: Soft, nontender, bowel sounds present Extremities: Trace edema  Skin: No rashes Psychiatry:  Mood & affect appropriate.     Data Reviewed:   CBC: Recent Labs  Lab 02/21/23 2046 02/23/23 0331  WBC 4.0 7.7  HGB 12.9* 13.5  HCT 39.2 39.3  MCV 103.2* 99.2  PLT 149* 173   Basic Metabolic  Panel: Recent Labs  Lab 02/21/23 2046 02/23/23 0331  NA 140 138  K 3.9 4.3  CL 107 102  CO2 22 24  GLUCOSE 121* 130*  BUN 20 29*  CREATININE 1.48* 1.53*  CALCIUM 9.2 9.2   GFR: Estimated Creatinine Clearance: 51.5 mL/min (A) (by C-G formula based on SCr of 1.53 mg/dL (H)). Liver Function Tests: No results for input(s): "AST", "ALT", "ALKPHOS", "BILITOT", "PROT", "ALBUMIN" in the  last 168 hours. No results for input(s): "LIPASE", "AMYLASE" in the last 168 hours. No results for input(s): "AMMONIA" in the last 168 hours. Coagulation Profile: No results for input(s): "INR", "PROTIME" in the last 168 hours. Cardiac Enzymes: No results for input(s): "CKTOTAL", "CKMB", "CKMBINDEX", "TROPONINI" in the last 168 hours. BNP (last 3 results) No results for input(s): "PROBNP" in the last 8760 hours. HbA1C: No results for input(s): "HGBA1C" in the last 72 hours. CBG: No results for input(s): "GLUCAP" in the last 168 hours. Lipid Profile: No results for input(s): "CHOL", "HDL", "LDLCALC", "TRIG", "CHOLHDL", "LDLDIRECT" in the last 72 hours. Thyroid Function Tests: No results for input(s): "TSH", "T4TOTAL", "FREET4", "T3FREE", "THYROIDAB" in the last 72 hours. Anemia Panel: No results for input(s): "VITAMINB12", "FOLATE", "FERRITIN", "TIBC", "IRON", "RETICCTPCT" in the last 72 hours. Urine analysis:    Component Value Date/Time   COLORURINE YELLOW 01/27/2023 2105   APPEARANCEUR CLEAR 01/27/2023 2105   LABSPEC 1.020 01/27/2023 2105   PHURINE 5.0 01/27/2023 2105   GLUCOSEU NEGATIVE 01/27/2023 2105   HGBUR NEGATIVE 01/27/2023 2105   BILIRUBINUR NEGATIVE 01/27/2023 2105   KETONESUR NEGATIVE 01/27/2023 2105   PROTEINUR NEGATIVE 01/27/2023 2105   UROBILINOGEN 1.0 03/10/2015 1203   NITRITE NEGATIVE 01/27/2023 2105   LEUKOCYTESUR NEGATIVE 01/27/2023 2105   Sepsis Labs: @LABRCNTIP (procalcitonin:4,lacticidven:4)  ) Recent Results (from the past 240 hour(s))  Resp panel by RT-PCR (RSV, Flu A&B, Covid) Anterior Nasal Swab     Status: None   Collection Time: 02/21/23  8:40 PM   Specimen: Anterior Nasal Swab  Result Value Ref Range Status   SARS Coronavirus 2 by RT PCR NEGATIVE NEGATIVE Final   Influenza A by PCR NEGATIVE NEGATIVE Final   Influenza B by PCR NEGATIVE NEGATIVE Final    Comment: (NOTE) The Xpert Xpress SARS-CoV-2/FLU/RSV plus assay is intended as an aid in  the diagnosis of influenza from Nasopharyngeal swab specimens and should not be used as a sole basis for treatment. Nasal washings and aspirates are unacceptable for Xpert Xpress SARS-CoV-2/FLU/RSV testing.  Fact Sheet for Patients: BloggerCourse.com  Fact Sheet for Healthcare Providers: SeriousBroker.it  This test is not yet approved or cleared by the [place] FDA and has been authorized for detection and/or diagnosis of SARS-CoV-2 by FDA under an Emergency Use Authorization (EUA). This EUA will remain in effect (meaning this test can be used) for the duration of the COVID-19 declaration under Section 564(b)(1) of the Act, 21 U.S.C. section 360bbb-3(b)(1), unless the authorization is terminated or revoked.     Resp Syncytial Virus by PCR NEGATIVE NEGATIVE Final    Comment: (NOTE) Fact Sheet for Patients: BloggerCourse.com  Fact Sheet for Healthcare Providers: SeriousBroker.it  This test is not yet approved or cleared by the [place] FDA and has been authorized for detection and/or diagnosis of SARS-CoV-2 by FDA under an Emergency Use Authorization (EUA). This EUA will remain in effect (meaning this test can be used) for the duration of the COVID-19 declaration under Section 564(b)(1) of the Act, 21 U.S.C. section 360bbb-3(b)(1), unless the authorization is terminated  or revoked.  Performed at [facility], [address], [place], [place] [address]      Radiology Studies: DG Chest 2 View  Result Date: 02/21/2023 CLINICAL DATA:  Shortness of breath EXAM: CHEST - 2 VIEW COMPARISON:  01/27/2023 FINDINGS: No acute airspace disease or significant effusion. Mild cardiomegaly. Embolization coil in the left upper quadrant IMPRESSION: Cardiomegaly Electronically Signed   By: Jasmine Pang M.D.   On: 02/21/2023 22:34     Scheduled Meds:  furosemide  40 mg  Intravenous BID   guaiFENesin  600 mg Oral BID   losartan  25 mg Oral Daily   nicotine  21 mg Transdermal Daily   sodium chloride flush  3 mL Intravenous Q12H   Continuous Infusions:   LOS: 1 day    Time spent:    Zannie Cove, MD [facility]   02/23/2023, 10:57 AM

## 2023-02-23 NOTE — Progress Notes (Signed)
Patient non-compliant with fluid restriction. Educated patient on reason for restriction.

## 2023-02-24 ENCOUNTER — Encounter (HOSPITAL_COMMUNITY): Payer: Self-pay | Admitting: Internal Medicine

## 2023-02-24 DIAGNOSIS — I5023 Acute on chronic systolic (congestive) heart failure: Secondary | ICD-10-CM | POA: Diagnosis not present

## 2023-02-24 LAB — CBC
HCT: 44.7 % (ref 39.0–52.0)
Hemoglobin: 15 g/dL (ref 13.0–17.0)
MCH: 34.4 pg — ABNORMAL HIGH (ref 26.0–34.0)
MCHC: 33.6 g/dL (ref 30.0–36.0)
MCV: 102.5 fL — ABNORMAL HIGH (ref 80.0–100.0)
Platelets: 188 10*3/uL (ref 150–400)
RBC: 4.36 MIL/uL (ref 4.22–5.81)
RDW: 16.8 % — ABNORMAL HIGH (ref 11.5–15.5)
WBC: 4.4 10*3/uL (ref 4.0–10.5)
nRBC: 0 % (ref 0.0–0.2)

## 2023-02-24 LAB — BASIC METABOLIC PANEL
Anion gap: 11 (ref 5–15)
BUN: 32 mg/dL — ABNORMAL HIGH (ref 6–20)
CO2: 27 mmol/L (ref 22–32)
Calcium: 9.3 mg/dL (ref 8.9–10.3)
Chloride: 102 mmol/L (ref 98–111)
Creatinine, Ser: 1.19 mg/dL (ref 0.61–1.24)
GFR, Estimated: >60 mL/min (ref >60)
Glucose, Bld: 116 mg/dL — ABNORMAL HIGH (ref 70–99)
Potassium: 3.7 mmol/L (ref 3.5–5.1)
Sodium: 140 mmol/L (ref 135–145)

## 2023-02-24 LAB — VITAMIN B12: Vitamin B-12: 330 pg/mL (ref 180–914)

## 2023-02-24 MED ORDER — POTASSIUM CHLORIDE CRYS ER 20 MEQ PO TBCR
40.0000 meq | EXTENDED_RELEASE_TABLET | Freq: Once | ORAL | Status: AC
  Administered 2023-02-24: 40 meq via ORAL
  Filled 2023-02-24: qty 2

## 2023-02-24 MED ORDER — MUSCLE RUB 10-15 % EX CREA
1.0000 | TOPICAL_CREAM | CUTANEOUS | Status: DC | PRN
  Administered 2023-02-24: 1 via TOPICAL
  Filled 2023-02-24: qty 85

## 2023-02-24 MED ORDER — ORAL CARE MOUTH RINSE: 15.0000 mL | OROMUCOSAL | Status: DC | PRN

## 2023-02-24 MED ORDER — FUROSEMIDE 10 MG/ML IJ SOLN
40.0000 mg | Freq: Once | INTRAMUSCULAR | Status: AC
  Administered 2023-02-24: 40 mg via INTRAVENOUS
  Filled 2023-02-24: qty 4

## 2023-02-24 MED ORDER — EMPAGLIFLOZIN 10 MG PO TABS
10.0000 mg | ORAL_TABLET | Freq: Every day | ORAL | Status: DC
  Administered 2023-02-24 – 2023-02-25 (×2): 10 mg via ORAL
  Filled 2023-02-24 (×2): qty 1

## 2023-02-24 NOTE — Progress Notes (Signed)
PROGRESS NOTE    Charles [name]  ZOX:[id number] DOB: 1970/08/05 DOA: 02/21/2023 PCP: Renaye Rakers, MD  51/ w chronic systolic CHF, CKD 3a, hypertension, chronic hepatitis C, polysubstance abuse (cocaine and tobacco), and homelessness who presents with complaints of not feeling well. -Recently hospitalized 9/16-9/20 with volume overload, diuresed, discharged, reports that he does not take any medications after discharge as he is homeless on the streets and unable to urinate constantly. -In the ED mildly tachypneic, tachycardic, blood pressure 181/131, BNP 2251, troponin 62, hemoglobin 12.9, BUN 20, creatinine 1.48, chest x-ray with cardiomegaly and embolization: Coil in left upper quadrant, flu COVID and RSV PCR were negative  Subjective: -Still has some cough, breathing better  Assessment and Plan:  Acute on chronic systolic CHF -Suspect a component of bronchitis as well as mild fluid overload on account of noncompliance with diuretics -Last echo 9/24 with EF 25-30%, grade 2 DD, mildly reduced RV -Appears close to euvolemic, 5.1 L negative, hold further IV Lasix today, continue losartan, add Jardiance -Noncompliance complicated by homelessness continues to be an ongoing challenge in his care -Social work consult  COPD Acute bronchitis -Continue supportive care, flu COVID and RSV PCR were negative -Continue albuterol nebs, Mucinex -Suspect viral URI, hold off on antibiotics at this time   Elevated troponins Chronic.  Secondary to demand, do not suspect ACS   Hypertensive urgency -Improving continue Lasix, resume losartan   Macrocytic anemia Acute.   -Recheck B12   Chronic kidney disease stage IIIa Stable at baseline, monitor   Thrombocytopenia Chronic.  Platelets noted to be 149.  Records note platelet count is higher than prior check last month. -Continue to monitor   Chronic hep C -Recommend outpatient follow-up with gastroenterology for treatment   Cocaine  abuse Patient reports continued cocaine use last reported few days ago. -Ativan IV as needed for intoxication withdrawal   Tobacco abuse -Nicotine patch offered -Counseled   Homelessness Patient reports living currently on the streets for which he is not taking his medications. -Transitions of care consulted for substance abuse as well as homelessness   DVT prophylaxis: SCDs Advance Care Planning:   Code Status: Full Code    Family Communication: None Disposition Plan: Likely to shelter  Consultants:    Procedures:   Antimicrobials:    Objective: Vitals:   02/24/23 0018 02/24/23 0342 02/24/23 0730 02/24/23 1108  BP: (!) 139/92 (!) 132/94 (!) 140/101 115/86  Pulse: 62 87 73 82  Resp: 18 18 18 19   Temp: 97.8 F (36.6 C) 98 F (36.7 C) 97.7 F (36.5 C) 97.6 F (36.4 C)  TempSrc: Oral Oral Oral Oral  SpO2: 99% 98% 100% 100%  Weight:  69.6 kg    Height:        Intake/Output Summary (Last 24 hours) at 02/24/2023 1214 Last data filed at 02/24/2023 1108 Gross per 24 hour  Intake 1380 ml  Output 3355 ml  Net -1975 ml   Filed Weights   02/21/23 2032 02/23/23 0354 02/24/23 0342  Weight: 69 kg 70.7 kg 69.6 kg    Examination:  General exam: Chronically ill male sitting up in bed, AAOx3 HEENT: No JVD CVS: S1-S2, regular rhythm Lungs: Few basilar Rales Abdomen: Soft, nontender, bowel sounds present Extremities: No edema  Skin: No rashes Psychiatry:  Mood & affect appropriate.     Data Reviewed:   CBC: Recent Labs  Lab 02/21/23 2046 02/23/23 0331 02/24/23 0245  WBC 4.0 7.7 4.4  HGB 12.9* 13.5 15.0  HCT  39.2 39.3 44.7  MCV 103.2* 99.2 102.5*  PLT 149* 173 188   Basic Metabolic Panel: Recent Labs  Lab 02/21/23 2046 02/23/23 0331 02/24/23 0245  NA 140 138 140  K 3.9 4.3 3.7  CL 107 102 102  CO2 22 24 27   GLUCOSE 121* 130* 116*  BUN 20 29* 32*  CREATININE 1.48* 1.53* 1.19  CALCIUM 9.2 9.2 9.3   GFR: Estimated Creatinine Clearance: 66.3  mL/min (by C-G formula based on SCr of 1.19 mg/dL). Liver Function Tests: No results for input(s): "AST", "ALT", "ALKPHOS", "BILITOT", "PROT", "ALBUMIN" in the last 168 hours. No results for input(s): "LIPASE", "AMYLASE" in the last 168 hours. No results for input(s): "AMMONIA" in the last 168 hours. Coagulation Profile: No results for input(s): "INR", "PROTIME" in the last 168 hours. Cardiac Enzymes: No results for input(s): "CKTOTAL", "CKMB", "CKMBINDEX", "TROPONINI" in the last 168 hours. BNP (last 3 results) No results for input(s): "PROBNP" in the last 8760 hours. HbA1C: No results for input(s): "HGBA1C" in the last 72 hours. CBG: No results for input(s): "GLUCAP" in the last 168 hours. Lipid Profile: No results for input(s): "CHOL", "HDL", "LDLCALC", "TRIG", "CHOLHDL", "LDLDIRECT" in the last 72 hours. Thyroid Function Tests: No results for input(s): "TSH", "T4TOTAL", "FREET4", "T3FREE", "THYROIDAB" in the last 72 hours. Anemia Panel: No results for input(s): "VITAMINB12", "FOLATE", "FERRITIN", "TIBC", "IRON", "RETICCTPCT" in the last 72 hours. Urine analysis:    Component Value Date/Time   COLORURINE YELLOW 01/27/2023 2105   APPEARANCEUR CLEAR 01/27/2023 2105   LABSPEC 1.020 01/27/2023 2105   PHURINE 5.0 01/27/2023 2105   GLUCOSEU NEGATIVE 01/27/2023 2105   HGBUR NEGATIVE 01/27/2023 2105   BILIRUBINUR NEGATIVE 01/27/2023 2105   KETONESUR NEGATIVE 01/27/2023 2105   PROTEINUR NEGATIVE 01/27/2023 2105   UROBILINOGEN 1.0 03/10/2015 1203   NITRITE NEGATIVE 01/27/2023 2105   LEUKOCYTESUR NEGATIVE 01/27/2023 2105   Sepsis Labs: @LABRCNTIP (procalcitonin:4,lacticidven:4)  ) Recent Results (from the past 240 hour(s))  Resp panel by RT-PCR (RSV, Flu A&B, Covid) Anterior Nasal Swab     Status: None   Collection Time: 02/21/23  8:40 PM   Specimen: Anterior Nasal Swab  Result Value Ref Range Status   SARS Coronavirus 2 by RT PCR NEGATIVE NEGATIVE Final   Influenza A by PCR  NEGATIVE NEGATIVE Final   Influenza B by PCR NEGATIVE NEGATIVE Final    Comment: (NOTE) The Xpert Xpress SARS-CoV-2/FLU/RSV plus assay is intended as an aid in the diagnosis of influenza from Nasopharyngeal swab specimens and should not be used as a sole basis for treatment. Nasal washings and aspirates are unacceptable for Xpert Xpress SARS-CoV-2/FLU/RSV testing.  Fact Sheet for Patients: BloggerCourse.com  Fact Sheet for Healthcare Providers: SeriousBroker.it  This test is not yet approved or cleared by the [place] FDA and has been authorized for detection and/or diagnosis of SARS-CoV-2 by FDA under an Emergency Use Authorization (EUA). This EUA will remain in effect (meaning this test can be used) for the duration of the COVID-19 declaration under Section 564(b)(1) of the Act, 21 U.S.C. section 360bbb-3(b)(1), unless the authorization is terminated or revoked.     Resp Syncytial Virus by PCR NEGATIVE NEGATIVE Final    Comment: (NOTE) Fact Sheet for Patients: BloggerCourse.com  Fact Sheet for Healthcare Providers: SeriousBroker.it  This test is not yet approved or cleared by the [place] FDA and has been authorized for detection and/or diagnosis of SARS-CoV-2 by FDA under an Emergency Use Authorization (EUA). This EUA will remain in effect (meaning this test  can be used) for the duration of the COVID-19 declaration under Section 564(b)(1) of the Act, 21 U.S.C. section 360bbb-3(b)(1), unless the authorization is terminated or revoked.  Performed at [facility], [address] [place], [place] [address]      Radiology Studies: No results found.   Scheduled Meds:  empagliflozin  10 mg Oral Daily   guaiFENesin  600 mg Oral BID   losartan  25 mg Oral Daily   nicotine  21 mg Transdermal Daily   sodium chloride flush  3 mL Intravenous Q12H    Continuous Infusions:   LOS: 2 days    Time spent:    Zannie Cove, MD [facility]   02/24/2023, 12:14 PM

## 2023-02-24 NOTE — Progress Notes (Incomplete)
   Heart Failure Stewardship Pharmacist Progress Note   PCP: Renaye Rakers, MD PCP-Cardiologist: None    HPI:  ***  Current HF Medications: {HF MEDS CURRENT:26665}  Prior to admission HF Medications: {HF MEDS PTA:26664}  Pertinent Lab Values: Serum creatinine ***, BUN ***, Potassium ***, Sodium ***, BNP ***, Magnesium ***, A1c ***, Digoxin ***   Vital Signs: Weight: *** lbs (admission weight: *** lbs) Blood pressure: ***  Heart rate: ***  I/O: net -***L yesterday; net -***L since admission  Medication Assistance / Insurance Benefits Check: Does the patient have prescription insurance?  {Yes/No/Pending:24180} Type of insurance plan: ***  Does the patient qualify for medication assistance through manufacturers or grants?   {Yes/No/Pending:24180} Eligible grants and/or patient assistance programs: *** Medication assistance applications in progress: ***  Medication assistance applications approved: *** Approved medication assistance renewals will be completed by: ***  Outpatient Pharmacy:  Prior to admission outpatient pharmacy: *** Is the patient willing to use [facility] TOC pharmacy at discharge? {Yes/No/Pending:24180} Is the patient willing to transition their outpatient pharmacy to utilize a [facility] outpatient pharmacy?   {Yes/No/Pending:24180}    Assessment: 1. Acute on ***chronic ***systolic CHF (LVEF ***%), due to ***. NYHA class *** symptoms. -    Plan: 1) Medication changes recommended at this time: -  2) Patient assistance: -  3)  Education  - To be completed prior to discharge  Sharen Hones, PharmD, BCPS Heart Failure Stewardship Pharmacist Phone [phone]

## 2023-02-24 NOTE — Progress Notes (Signed)
Heart Failure Nurse Navigator Progress Note  PCP: Renaye Rakers, MD PCP-Cardiologist: Prior [place] Admission Diagnosis: Acute on Chronic Systolic Congestive Heart Failure Admitted from: Home via EMS  Presentation:   Charles [name] presented with Congestion, feeling like fluid on his lungs,some chest pain with coughing.  Was hospitalized 2 weeks ago for CHF Exacerbation, Pt reported he has not taken his medication since discharge.  He only wants to take them when in the hospital. BP 181/131, HR 49, BNP 2251. Chest x-ray shows cardiomegaly without evidence of pulmonary edema. EKG without acute ischemic changes.  Education initiated with patient on the signs and symptoms of Heart Failure, Daily Weights, Medication Compliance, Diet and Fluid Restrictions and attending all medical appointments.  Pt agreed to come to [facility] on 03/07/23 @ 2:00 pm.  He will ask his sister or niece to bring him to the appointment.  ECHO/ LVEF: 25-30%  Clinical Course:  Past Medical History:  Diagnosis Date   Aplastic anemia (HCC)    Arthritis    "left ankle" (10/17/2014)   Bilateral pneumonia 10/17/2014   Hepatitis    "think it was B" (10/17/2014)   History of blood transfusion "several"   "related to aplastic anemia"   Hypertension    Laceration of spleen    s/p embolization   Polysubstance abuse (HCC)    cocaine, benzo's, opiates, THC   Sleep apnea    "wore mask in prison; 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" (10/17/2014)     Social History   Socioeconomic History   Marital status: Single    Spouse name: Not on file   Number of children: Not on file   Years of education: Not on file   Highest education level: Not on file  Occupational History   Occupation: Unemployed  Tobacco Use   Smoking status: Every Day    Current packs/day: 0.25    Average packs/day: 0.3 packs/day for 30.0 years (7.5 ttl pk-yrs)    Types: Cigarettes, Cigars   Smokeless tobacco: Former  Building services engineer status: Never Used  Substance and  Sexual Activity   Alcohol use: Yes    Comment: occ   Drug use: Yes    Frequency: 7.0 times per week    Types: Cocaine, Marijuana    Comment: Uses cocaine everyday   Sexual activity: Not on file  Other Topics Concern   Not on file  Social History Narrative   Not on file   Social Determinants of Health   Financial Resource Strain: High Risk (02/24/2023)   Overall Financial Resource Strain (CARDIA)    Difficulty of Paying Living Expenses: Very hard  Food Insecurity: Food Insecurity Present (02/22/2023)   Hunger Vital Sign    Worried About Programme researcher, broadcasting/film/video in the Last Year: Often true    Ran Out of Food in the Last Year: Often true  Transportation Needs: Unmet Transportation Needs (02/22/2023)   PRAPARE - Administrator, Civil Service (Medical): Yes    Lack of Transportation (Non-Medical): Yes  Physical Activity: Not on file  Stress: Not on file  Social Connections: Unknown (09/24/2021)   [facility] from [facility]   Social Network    Social Network: Not on file   Education Assessment and Provision:  Detailed education and instructions provided on heart failure disease management including the following:  Signs and symptoms of Heart Failure When to call the physician Importance of daily weights Low sodium diet Fluid restriction Medication management Anticipated future  follow-up appointments  Patient education given on each of the above topics.  Patient acknowledges understanding via teach back method and acceptance of all instructions.  Education Materials:  "Living Better With Heart Failure" Booklet, HF zone tool, & Daily Weight Tracker Tool.  Patient has scale at home: No-will given one at [facility] appt Patient has pill box at home: No    High Risk Criteria for Readmission and/or Poor Patient Outcomes: Heart failure hospital admissions (last 6 months): 4  No Show rate: 9 Difficult social situation: Yes, Homeless & No  Transportation Demonstrates medication adherence: No, Pt verbalizes to not like taking medications Primary Language: English Literacy level: ?  Barriers of Care:   Homeless, No Transportation Medication Compliance Diet & Fluid Restrictions Daily Weights Substance Abuse (Cocaine)   Considerations/Referrals:   Referral made to Heart Failure Pharmacist Stewardship: Yes Referral made to Heart Failure CSW/NCM TOC: Yes, Homeless, No Transportation Referral made to Heart & Vascular TOC clinic: Yes, 03/07/23 @ 2:00 pm  Items for Follow-up on DC/TOC: Medication Compliance Diet & Fluid Restrictions, Daily Weights [id number], Transportation, Substance Abuse Continued Heart Failure Education    Roxy Horseman, RN, BSN [facility] Heart Failure Navigator Secure Chat Only

## 2023-02-24 NOTE — TOC Initial Note (Addendum)
Transition of Care [facility]) - Initial/Assessment Note    Patient Details  Name: Charles [name] MRN: [id number] Date of Birth: June 18, 1970  Transition of Care [facility]) CM/SW Contact:    Leone Haven, RN Phone Number: 02/24/2023, 3:49 PM  Clinical Narrative:                 Homeless , has no PCP but has insurance on file, states has no HH services in place at this time or DME .  States niece will transport him home at dc or may need a bus pass and she is support system, states gets medications from [facility] on [address].  Pta self ambulatory.  On previous admits he was assisted with bus pass he may need bus pass at dc.  NCM added shelter list and SA resources  and transportation resources to AVS.     Expected Discharge Plan: Homeless Shelter Barriers to Discharge: Continued Medical Work up   Patient Goals and CMS Choice Patient states their goals for this hospitalization and ongoing recovery are:: get better   Choice offered to / list presented to : NA      Expected Discharge Plan and Services In-house Referral: NA Discharge Planning Services: CM Consult Post Acute Care Choice: NA Living arrangements for the past 2 months: Homeless Shelter                 DME Arranged: N/A DME Agency: NA       HH Arranged: NA          Prior Living Arrangements/Services Living arrangements for the past 2 months: Homeless Shelter Lives with:: Other (Comment) (homeless) Patient language and need for interpreter reviewed:: Yes Do you feel safe going back to the place where you live?: Yes      Need for Family Participation in Patient Care: No (Comment) Care giver support system in place?: No (comment)   Criminal Activity/Legal Involvement Pertinent to Current Situation/Hospitalization: No - Comment as needed  Activities of Daily Living   ADL Screening (condition at time of admission) Independently performs ADLs?: Yes (appropriate for developmental age) Is the patient deaf or have  difficulty hearing?: No Does the patient have difficulty seeing, even when wearing glasses/contacts?: No Does the patient have difficulty concentrating, remembering, or making decisions?: No  Permission Sought/Granted                  Emotional Assessment Appearance:: Appears stated age     Orientation: : Oriented to Self, Oriented to Place, Oriented to  Time, Oriented to Situation Alcohol / Substance Use: Illicit Drugs Psych Involvement: No (comment)  Admission diagnosis:  Acute on chronic systolic congestive heart failure (HCC) [I50.23] Acute on chronic systolic CHF (congestive heart failure) (HCC) [I50.23] Patient Active Problem List   Diagnosis Date Noted   Macrocytic anemia 02/22/2023   Chronic kidney disease, stage III (moderate) (HCC) 02/22/2023   Acute on chronic systolic CHF (congestive heart failure) (HCC) 01/27/2023   Prolonged QT interval 01/27/2023   Elevated blood pressure reading with diagnosis of hypertension 11/27/2022   Hypertensive crisis 11/26/2022   Generalized weakness 11/26/2022   Laceration of hand 11/16/2022   COPD (chronic obstructive pulmonary disease) (HCC) 10/03/2022   CHF (congestive heart failure) (HCC) 10/02/2022   Acute hypoxic respiratory failure (HCC) 09/03/2022   Chronic pain 08/13/2022   Acute on chronic congestive heart failure (HCC) 08/10/2022   Hypertension 07/22/2022   Acute on chronic systolic congestive heart failure (HCC) 02/28/2022   Elevated troponin  02/28/2022   CKD (chronic kidney disease), stage II 02/28/2022   Cocaine abuse (HCC) 02/28/2022   Bacteremia 05/27/2021   Acute on chronic systolic and diastolic CHF (congestive heart failure) (HCC) 05/27/2021   Hypertensive urgency 05/26/2021   Polysubstance abuse (HCC) 05/26/2021   COPD with acute exacerbation (HCC) 05/26/2021   Chronic hepatitis C (HCC) 05/26/2021   Homelessness 05/26/2021   Seizure (HCC) 06/06/2020   Hypokalemia 06/22/2019   Splenic laceration, initial  encounter 08/02/2016   Thrombocytopenia (HCC) 10/19/2014   Tobacco abuse 10/19/2014   PCP:  Renaye Rakers, MD Pharmacy:   [facility] [phone] - [place], [place] - [address] AT [facility] OF [address] [place] [address] Phone: [phone] Fax: [phone]  [id number] [place], [place] - [address] AT [facility] OF [address] [place] [address] Phone: [phone] Fax: [phone]  [facility] [address] [place] [address] Phone: [phone] Fax: [phone]  [facility] [address] [place] [address] Phone: [phone] Fax: [phone]  [facility] [address] [place] [address] Phone: [phone] Fax: [phone]     Social Determinants of Health (SDOH) Social History: SDOH Screenings   Food Insecurity: Food Insecurity Present (02/22/2023)  Housing: High Risk (02/24/2023)  Transportation Needs: Unmet Transportation Needs (02/22/2023)  Utilities: At Risk (02/22/2023)  Alcohol Screen: Medium Risk (02/24/2023)  Financial Resource Strain: High Risk (02/24/2023)  Social Connections: Unknown (09/24/2021)   Received from [facility]  Tobacco Use: High Risk (02/24/2023)   SDOH Interventions: Housing Interventions: Other (Comment) (Consulting with CSW/CSM) Alcohol Usage Interventions: Other (Comment) Financial Strain Interventions: Other (Comment)   Readmission Risk Interventions    01/29/2023    8:43 AM 10/04/2022   11:27 AM 08/12/2022    4:45 PM  Readmission Risk Prevention Plan  Transportation Screening Complete Complete Complete  Medication Review (RN Care Manager) Complete Complete Complete  PCP or Specialist appointment within 3-5 days of discharge Complete Complete Complete  HRI or Home Care Consult  Complete Complete Complete  SW Recovery Care/Counseling Consult  Complete Complete  Palliative Care Screening Not Applicable Not Applicable Not Applicable  Skilled Nursing Facility Not Applicable Not Applicable Not Applicable

## 2023-02-24 NOTE — Progress Notes (Signed)
Patient bedside nurse reporting that patient's telemetry showed some ST depression.  Patient denies any chest pain, and shortness of breath.  Per chart review patient has been admitted for acute on chronic systolic heart failure (EF 25 to 30%).  Troponin from 10/12 62 and 68 without any delta change and previous EKG unremarkable.  Initial troponin was elevated in the setting of demand ischemia from CHF exacerbation. - EKG showing normal sinus rhythm, heart rate 72, left ventricular hypertrophy with repolarization abnormality more more prominent on P5 bleed.  Prolonged QTc 499.  Patient does not have any chest pain, chest pressure.  ACS ruled out. -Continue to monitor.  Tereasa Coop, MD [facility] 02/24/2023, 5:09 AM

## 2023-02-24 NOTE — Plan of Care (Signed)
Problem: Education: Goal: Ability to demonstrate management of disease process will improve Outcome: Progressing   Problem: Education: Goal: Ability to verbalize understanding of medication therapies will improve Outcome: Progressing   Problem: Activity: Goal: Capacity to carry out activities will improve Outcome: Progressing   Problem: Cardiac: Goal: Ability to achieve and maintain adequate cardiopulmonary perfusion will improve Outcome: Progressing   Problem: Education: Goal: Knowledge of General Education information will improve Description: Including pain rating scale, medication(s)/side effects and non-pharmacologic comfort measures Outcome: Progressing

## 2023-02-25 ENCOUNTER — Other Ambulatory Visit (HOSPITAL_COMMUNITY): Payer: Self-pay

## 2023-02-25 DIAGNOSIS — I5023 Acute on chronic systolic (congestive) heart failure: Secondary | ICD-10-CM | POA: Diagnosis not present

## 2023-02-25 LAB — BASIC METABOLIC PANEL
Anion gap: 9 (ref 5–15)
BUN: 28 mg/dL — ABNORMAL HIGH (ref 6–20)
CO2: 25 mmol/L (ref 22–32)
Calcium: 9.1 mg/dL (ref 8.9–10.3)
Chloride: 103 mmol/L (ref 98–111)
Creatinine, Ser: 1.3 mg/dL — ABNORMAL HIGH (ref 0.61–1.24)
GFR, Estimated: >60 mL/min (ref >60)
Glucose, Bld: 122 mg/dL — ABNORMAL HIGH (ref 70–99)
Potassium: 4 mmol/L (ref 3.5–5.1)
Sodium: 137 mmol/L (ref 135–145)

## 2023-02-25 MED ORDER — GUAIFENESIN ER 600 MG PO TB12
600.0000 mg | ORAL_TABLET | Freq: Two times a day (BID) | ORAL | 0 refills | Status: AC
  Filled 2023-02-25: qty 10, 5d supply, fill #0

## 2023-02-25 MED ORDER — SPIRONOLACTONE 12.5 MG HALF TABLET
12.5000 mg | ORAL_TABLET | Freq: Every day | ORAL | Status: DC
  Administered 2023-02-25: 12.5 mg via ORAL
  Filled 2023-02-25: qty 1

## 2023-02-25 MED ORDER — SPIRONOLACTONE 25 MG PO TABS
12.5000 mg | ORAL_TABLET | Freq: Every day | ORAL | 0 refills | Status: DC
  Filled 2023-02-25: qty 30, 60d supply, fill #0

## 2023-02-25 MED ORDER — LOSARTAN POTASSIUM 25 MG PO TABS
25.0000 mg | ORAL_TABLET | Freq: Every day | ORAL | 0 refills | Status: DC
  Filled 2023-02-25: qty 30, 30d supply, fill #0

## 2023-02-25 MED ORDER — EMPAGLIFLOZIN 10 MG PO TABS
10.0000 mg | ORAL_TABLET | Freq: Every day | ORAL | 0 refills | Status: DC
  Filled 2023-02-25: qty 30, 30d supply, fill #0

## 2023-02-25 MED ORDER — ALBUTEROL SULFATE HFA 108 (90 BASE) MCG/ACT IN AERS
2.0000 | INHALATION_SPRAY | Freq: Four times a day (QID) | RESPIRATORY_TRACT | 2 refills | Status: DC | PRN
  Filled 2023-02-25: qty 6.7, 25d supply, fill #0

## 2023-02-25 MED ORDER — FUROSEMIDE 40 MG PO TABS
40.0000 mg | ORAL_TABLET | Freq: Every day | ORAL | 0 refills | Status: DC
  Filled 2023-02-25: qty 30, 30d supply, fill #0

## 2023-02-25 MED ORDER — VITAMIN B-12 1000 MCG PO TABS
1000.0000 ug | ORAL_TABLET | Freq: Every day | ORAL | 0 refills | Status: DC
  Filled 2023-02-25: qty 30, 30d supply, fill #0

## 2023-02-25 NOTE — Plan of Care (Signed)

## 2023-02-25 NOTE — Care Management Important Message (Signed)
Important Message  Patient Details  Name: Dayshon Roback MRN: [id number] Date of Birth: 03-Apr-1971   Important Message Given:  Yes - Medicare IM     Dorena Bodo 02/25/2023, 2:37 PM

## 2023-02-25 NOTE — Discharge Summary (Signed)
Medication List     TAKE these medications    empagliflozin 10 MG Tabs tablet Commonly known as: Jardiance Take 1 tablet (10 mg total) by mouth daily before breakfast.   furosemide 40 MG tablet Commonly  known as: LASIX Take 1 tablet (40 mg total) by mouth daily.   guaiFENesin 600 MG 12 hr tablet Commonly known as: MUCINEX Take 1 tablet (600 mg total) by mouth 2 (two) times daily for 5 days.   losartan 25 MG tablet Commonly known as: COZAAR Take 1 tablet (25 mg total) by mouth daily.   spironolactone 25 MG tablet Commonly known as: ALDACTONE Take 0.5 tablets (12.5 mg total) by mouth daily. What changed: how much to take       Allergies  Allergen Reactions   Egg-Derived Products Nausea And Vomiting   Banana Nausea And Vomiting   Pork-Derived Products Nausea And Vomiting    Follow-up Information     [name]. Go in 10 day(s).   Specialty: Cardiology Why: Hospital Follow-Up 03/07/23 @ 2:00pm Please bring medication list with you to your follow-up appt. Free [employer].  Entrance C. off of [address] information: [address] [place] [address] [phone]                 The results of significant diagnostics from this hospitalization (including imaging, microbiology, ancillary and laboratory) are listed below for reference.    Significant Diagnostic Studies: DG Chest 2 View  Result Date: 02/21/2023 CLINICAL DATA:  Shortness of breath EXAM: CHEST - 2 VIEW COMPARISON:  01/27/2023 FINDINGS: No acute airspace disease or significant effusion. Mild cardiomegaly. Embolization coil in the left upper quadrant IMPRESSION: Cardiomegaly Electronically Signed   By: [name] M.D.   On: 02/21/2023 22:34   ECHOCARDIOGRAM COMPLETE  Result Date: 01/28/2023    ECHOCARDIOGRAM REPORT   Patient Name:   Charles [name] Date of Exam: 01/28/2023 Medical Rec #:  [id number]       Height:       66.0 in Accession #:    [id number]      Weight:       149.9 lb Date of Birth:  08-20-1970       BSA:          1.769 m Patient Age:    51 years        BP:           144/112 mmHg Patient Gender: M                HR:           76 bpm. Exam Location:  Inpatient Procedure: 2D Echo, Color Doppler and Cardiac Doppler Indications:    CHF - Acute Systolic I50.21  History:        Patient has prior history of Echocardiogram examinations, most                 recent 07/21/2022. Risk Factors:Hypertension.  Sonographer:    [name] RDCS Referring Phys: [name] IMPRESSIONS  1. Left ventricular ejection fraction, by estimation, is 25 to 30%. The left ventricle has severely decreased function. The left ventricle demonstrates global hypokinesis. There is moderate left ventricular hypertrophy. Left ventricular diastolic parameters are consistent with Grade II diastolic dysfunction (pseudonormalization). Elevated left atrial pressure.  2. Right ventricular systolic function is mildly reduced. The right ventricular size is normal. Tricuspid regurgitation signal is inadequate for assessing PA  Physician Discharge Summary  Charles [name] MVH:[id number] DOB: [birth year] DOA: 02/21/2023  PCP: Charles Rakers, MD  Admit date: 02/21/2023 Discharge date: 02/25/2023  Time spent: 35 minutes  Recommendations for Outpatient Follow-up:  Heart failure clinic 10/25 at 2 PM Needs ongoing social work assistance for homelessness   Discharge Diagnoses:  Principal Problem:   Acute on chronic systolic CHF (congestive heart failure) (HCC) Polysubstance abuse Noncompliance Homelessness   Hypertensive urgency   Elevated troponin   Macrocytic anemia   COPD (chronic obstructive pulmonary disease) (HCC)   Chronic kidney disease, stage III (moderate) (HCC)   Thrombocytopenia (HCC)   Chronic hepatitis C (HCC)   Cocaine abuse (HCC)   Tobacco abuse  Discharge Condition: Improved  Diet recommendation: Low-sodium heart healthy  Filed Weights   02/23/23 0354 02/24/23 0342 02/25/23 0424  Weight: 70.7 kg 69.6 kg 69.9 kg    History of present illness:  51/ w chronic systolic CHF, CKD 3a, hypertension, chronic hepatitis C, polysubstance abuse (cocaine and tobacco), and homelessness who presents with complaints of not feeling well. -Recently hospitalized 9/16-9/20 with volume overload, diuresed, discharged, reports that he does not take any medications after discharge as he is homeless on the streets and unable to urinate constantly. -In the ED mildly tachypneic, tachycardic, blood pressure 181/131, BNP 2251, troponin 62, hemoglobin 12.9, BUN 20, creatinine 1.48, chest x-ray with cardiomegaly and embolization: Coil in left upper quadrant, flu COVID and RSV PCR were negative  Hospital Course:   Acute on chronic systolic CHF -Suspect a component of bronchitis as well as mild fluid overload on account of noncompliance with diuretics -Last echo 9/24 with EF 25-30%, grade 2 DD, mildly reduced RV -Diuresed with IV Lasix, volume status has improved, he is 7 L negative  -Switch to oral Lasix, losartan,  started on Jardiance, continued Aldactone -Noncompliance complicated by homelessness continues to be an ongoing challenge in his care -Social work consulted   COPD Acute bronchitis -Continue supportive care, flu COVID and RSV PCR were negative -Continue albuterol nebs, Mucinex -Suspect viral URI, hold off on antibiotics at this time   Elevated troponins Chronic.  Secondary to demand, do not suspect ACS   Hypertensive urgency -Improving continue Lasix, resume losartan   Macrocytic anemia Acute.   -B12 low normal, will add replacement   Chronic kidney disease stage IIIa Stable at baseline, monitor   Thrombocytopenia Chronic.  Stable   Chronic hep C -Recommend outpatient follow-up with gastroenterology for treatment   Cocaine abuse Patient reports continued cocaine use last reported few days ago. -Counseled   Tobacco abuse -Nicotine patch offered -Counseled   Homelessness Patient reports living currently on the streets for which he is not taking his medications. -Transitions of care consulted for substance abuse as well as homelessness    Discharge Exam: Vitals:   02/24/23 1932 02/25/23 0424  BP: (!) 140/108 122/76  Pulse: 76 62  Resp: 18 18  Temp: 98 F (36.7 C) 98 F (36.7 C)  SpO2: 99% 98%   General exam: Chronically ill male sitting up in bed, AAOx3 HEENT: No JVD CVS: S1-S2, regular rhythm Lungs: Few basilar Rales Abdomen: Soft, nontender, bowel sounds present Extremities: No edema  Skin: No rashes Psychiatry:  Mood & affect appropriate.   Discharge Instructions    Allergies as of 02/25/2023       Reactions   Egg-derived Products Nausea And Vomiting   Banana Nausea And Vomiting   Pork-derived Products Nausea And Vomiting  Medication List     TAKE these medications    empagliflozin 10 MG Tabs tablet Commonly known as: Jardiance Take 1 tablet (10 mg total) by mouth daily before breakfast.   furosemide 40 MG tablet Commonly  known as: LASIX Take 1 tablet (40 mg total) by mouth daily.   guaiFENesin 600 MG 12 hr tablet Commonly known as: MUCINEX Take 1 tablet (600 mg total) by mouth 2 (two) times daily for 5 days.   losartan 25 MG tablet Commonly known as: COZAAR Take 1 tablet (25 mg total) by mouth daily.   spironolactone 25 MG tablet Commonly known as: ALDACTONE Take 0.5 tablets (12.5 mg total) by mouth daily. What changed: how much to take       Allergies  Allergen Reactions   Egg-Derived Products Nausea And Vomiting   Banana Nausea And Vomiting   Pork-Derived Products Nausea And Vomiting    Follow-up Information     [name]. Go in 10 day(s).   Specialty: Cardiology Why: Hospital Follow-Up 03/07/23 @ 2:00pm Please bring medication list with you to your follow-up appt. Free [employer].  Entrance C. off of [address] information: [address] [place] [address] [phone]                 The results of significant diagnostics from this hospitalization (including imaging, microbiology, ancillary and laboratory) are listed below for reference.    Significant Diagnostic Studies: DG Chest 2 View  Result Date: 02/21/2023 CLINICAL DATA:  Shortness of breath EXAM: CHEST - 2 VIEW COMPARISON:  01/27/2023 FINDINGS: No acute airspace disease or significant effusion. Mild cardiomegaly. Embolization coil in the left upper quadrant IMPRESSION: Cardiomegaly Electronically Signed   By: [name] M.D.   On: 02/21/2023 22:34   ECHOCARDIOGRAM COMPLETE  Result Date: 01/28/2023    ECHOCARDIOGRAM REPORT   Patient Name:   [name] Date of Exam: 01/28/2023 Medical Rec #:  [id number]       Height:       66.0 in Accession #:    [id number]      Weight:       149.9 lb Date of Birth:  08-20-1970       BSA:          1.769 m Patient Age:    51 years        BP:           144/112 mmHg Patient Gender: M                HR:           76 bpm. Exam Location:  Inpatient Procedure: 2D Echo, Color Doppler and Cardiac Doppler Indications:    CHF - Acute Systolic I50.21  History:        Patient has prior history of Echocardiogram examinations, most                 recent 07/21/2022. Risk Factors:Hypertension.  Sonographer:    [name] RDCS Referring Phys: [name] IMPRESSIONS  1. Left ventricular ejection fraction, by estimation, is 25 to 30%. The left ventricle has severely decreased function. The left ventricle demonstrates global hypokinesis. There is moderate left ventricular hypertrophy. Left ventricular diastolic parameters are consistent with Grade II diastolic dysfunction (pseudonormalization). Elevated left atrial pressure.  2. Right ventricular systolic function is mildly reduced. The right ventricular size is normal. Tricuspid regurgitation signal is inadequate for assessing PA  Physician Discharge Summary  Charles [name] MVH:[id number] DOB: [birth year] DOA: 02/21/2023  PCP: Charles Rakers, MD  Admit date: 02/21/2023 Discharge date: 02/25/2023  Time spent: 35 minutes  Recommendations for Outpatient Follow-up:  Heart failure clinic 10/25 at 2 PM Needs ongoing social work assistance for homelessness   Discharge Diagnoses:  Principal Problem:   Acute on chronic systolic CHF (congestive heart failure) (HCC) Polysubstance abuse Noncompliance Homelessness   Hypertensive urgency   Elevated troponin   Macrocytic anemia   COPD (chronic obstructive pulmonary disease) (HCC)   Chronic kidney disease, stage III (moderate) (HCC)   Thrombocytopenia (HCC)   Chronic hepatitis C (HCC)   Cocaine abuse (HCC)   Tobacco abuse  Discharge Condition: Improved  Diet recommendation: Low-sodium heart healthy  Filed Weights   02/23/23 0354 02/24/23 0342 02/25/23 0424  Weight: 70.7 kg 69.6 kg 69.9 kg    History of present illness:  51/ w chronic systolic CHF, CKD 3a, hypertension, chronic hepatitis C, polysubstance abuse (cocaine and tobacco), and homelessness who presents with complaints of not feeling well. -Recently hospitalized 9/16-9/20 with volume overload, diuresed, discharged, reports that he does not take any medications after discharge as he is homeless on the streets and unable to urinate constantly. -In the ED mildly tachypneic, tachycardic, blood pressure 181/131, BNP 2251, troponin 62, hemoglobin 12.9, BUN 20, creatinine 1.48, chest x-ray with cardiomegaly and embolization: Coil in left upper quadrant, flu COVID and RSV PCR were negative  Hospital Course:   Acute on chronic systolic CHF -Suspect a component of bronchitis as well as mild fluid overload on account of noncompliance with diuretics -Last echo 9/24 with EF 25-30%, grade 2 DD, mildly reduced RV -Diuresed with IV Lasix, volume status has improved, he is 7 L negative  -Switch to oral Lasix, losartan,  started on Jardiance, continued Aldactone -Noncompliance complicated by homelessness continues to be an ongoing challenge in his care -Social work consulted   COPD Acute bronchitis -Continue supportive care, flu COVID and RSV PCR were negative -Continue albuterol nebs, Mucinex -Suspect viral URI, hold off on antibiotics at this time   Elevated troponins Chronic.  Secondary to demand, do not suspect ACS   Hypertensive urgency -Improving continue Lasix, resume losartan   Macrocytic anemia Acute.   -B12 low normal, will add replacement   Chronic kidney disease stage IIIa Stable at baseline, monitor   Thrombocytopenia Chronic.  Stable   Chronic hep C -Recommend outpatient follow-up with gastroenterology for treatment   Cocaine abuse Patient reports continued cocaine use last reported few days ago. -Counseled   Tobacco abuse -Nicotine patch offered -Counseled   Homelessness Patient reports living currently on the streets for which he is not taking his medications. -Transitions of care consulted for substance abuse as well as homelessness    Discharge Exam: Vitals:   02/24/23 1932 02/25/23 0424  BP: (!) 140/108 122/76  Pulse: 76 62  Resp: 18 18  Temp: 98 F (36.7 C) 98 F (36.7 C)  SpO2: 99% 98%   General exam: Chronically ill male sitting up in bed, AAOx3 HEENT: No JVD CVS: S1-S2, regular rhythm Lungs: Few basilar Rales Abdomen: Soft, nontender, bowel sounds present Extremities: No edema  Skin: No rashes Psychiatry:  Mood & affect appropriate.   Discharge Instructions    Allergies as of 02/25/2023       Reactions   Egg-derived Products Nausea And Vomiting   Banana Nausea And Vomiting   Pork-derived Products Nausea And Vomiting  Medication List     TAKE these medications    empagliflozin 10 MG Tabs tablet Commonly known as: Jardiance Take 1 tablet (10 mg total) by mouth daily before breakfast.   furosemide 40 MG tablet Commonly  known as: LASIX Take 1 tablet (40 mg total) by mouth daily.   guaiFENesin 600 MG 12 hr tablet Commonly known as: MUCINEX Take 1 tablet (600 mg total) by mouth 2 (two) times daily for 5 days.   losartan 25 MG tablet Commonly known as: COZAAR Take 1 tablet (25 mg total) by mouth daily.   spironolactone 25 MG tablet Commonly known as: ALDACTONE Take 0.5 tablets (12.5 mg total) by mouth daily. What changed: how much to take       Allergies  Allergen Reactions   Egg-Derived Products Nausea And Vomiting   Banana Nausea And Vomiting   Pork-Derived Products Nausea And Vomiting    Follow-up Information     [name]. Go in 10 day(s).   Specialty: Cardiology Why: Hospital Follow-Up 03/07/23 @ 2:00pm Please bring medication list with you to your follow-up appt. Free [employer].  Entrance C. off of [address] information: [address] [place] [address] [phone]                 The results of significant diagnostics from this hospitalization (including imaging, microbiology, ancillary and laboratory) are listed below for reference.    Significant Diagnostic Studies: DG Chest 2 View  Result Date: 02/21/2023 CLINICAL DATA:  Shortness of breath EXAM: CHEST - 2 VIEW COMPARISON:  01/27/2023 FINDINGS: No acute airspace disease or significant effusion. Mild cardiomegaly. Embolization coil in the left upper quadrant IMPRESSION: Cardiomegaly Electronically Signed   By: [name] M.D.   On: 02/21/2023 22:34   ECHOCARDIOGRAM COMPLETE  Result Date: 01/28/2023    ECHOCARDIOGRAM REPORT   Patient Name:   [facility] [name] Date of Exam: 01/28/2023 Medical Rec #:  [id number]       Height:       66.0 in Accession #:    [id number]      Weight:       149.9 lb Date of Birth:  08-20-1970       BSA:          1.769 m Patient Age:    51 years        BP:           144/112 mmHg Patient Gender: M                HR:           76 bpm. Exam Location:  Inpatient Procedure: 2D Echo, Color Doppler and Cardiac Doppler Indications:    CHF - Acute Systolic I50.21  History:        Patient has prior history of Echocardiogram examinations, most                 recent 07/21/2022. Risk Factors:Hypertension.  Sonographer:    [name] RDCS Referring Phys: [name] IMPRESSIONS  1. Left ventricular ejection fraction, by estimation, is 25 to 30%. The left ventricle has severely decreased function. The left ventricle demonstrates global hypokinesis. There is moderate left ventricular hypertrophy. Left ventricular diastolic parameters are consistent with Grade II diastolic dysfunction (pseudonormalization). Elevated left atrial pressure.  2. Right ventricular systolic function is mildly reduced. The right ventricular size is normal. Tricuspid regurgitation signal is inadequate for assessing PA

## 2023-02-25 NOTE — TOC Transition Note (Addendum)
Transition of Care [facility]) - CM/SW Discharge Note   Patient Details  Name: [name] MRN: [id number] Date of Birth: [birth year]  Transition of Care [facility] [place]) CM/SW Contact:  Leone Haven, RN Phone Number: 02/25/2023, 2:02 PM   Clinical Narrative:    For possible dc today, NCM gave patient a bus pass for transport home. Resources added to AVS. Patient is homeless.  HF CSW to see patient per MD.     Barriers to Discharge: Continued Medical Work up   Patient Goals and CMS Choice   Choice offered to / list presented to : NA  Discharge Placement                         Discharge Plan and Services Additional resources added to the After Visit Summary for   In-house Referral: NA Discharge Planning Services: CM Consult Post Acute Care Choice: NA          DME Arranged: N/A DME Agency: NA       HH Arranged: NA          Social Determinants of Health (SDOH) Interventions SDOH Screenings   Food Insecurity: Food Insecurity Present (02/22/2023)  Housing: High Risk (02/24/2023)  Transportation Needs: Unmet Transportation Needs (02/22/2023)  Utilities: At Risk (02/22/2023)  Alcohol Screen: Medium Risk (02/24/2023)  Financial Resource Strain: High Risk (02/24/2023)  Social Connections: Unknown (09/24/2021)   [facility] from [facility]  Tobacco Use: High Risk (02/24/2023)     Readmission Risk Interventions    01/29/2023    8:43 AM 10/04/2022   11:27 AM 08/12/2022    4:45 PM  Readmission Risk Prevention Plan  Transportation Screening Complete Complete Complete  Medication Review (RN Care Manager) Complete Complete Complete  PCP or Specialist appointment within 3-5 days of discharge Complete Complete Complete  HRI or Home Care Consult Complete Complete Complete  SW Recovery Care/Counseling Consult  Complete Complete  Palliative Care Screening Not Applicable Not Applicable Not Applicable  Skilled Nursing Facility Not Applicable Not  Applicable Not Applicable

## 2023-02-25 NOTE — Progress Notes (Signed)
Heart Failure Stewardship Pharmacist Progress Note   PCP: Renaye Rakers, MD PCP-Cardiologist: None    HPI:  15 YOM with a PMH of chronic CHF, COPD, CKD IIIa, chronic hepatitis C, polysubstance abuse (cocaine and tobacco), and homelessness. Recently hospitalized from 9/16 to 9/20 with volume overload - but did not take any medications after discharge because he is unable to urinate constantly.   Initially presenting with cough and SOB. Last Echo 9/17 demonstrated EF 25-30%, severely decreased function, moderate LVH, G2DD, global hypokinesis, mildly reduced RV. CXR 10/11 demonstrated cardiomegaly, no acute airspace disease or effusion. Acute bronchitis thought to be 2/2 viral URI.   Per dispense report, patient has not been established with a pharmacy outside of [facility] TOC after hospitalizations. Reports that at last discharge he lost his medications right after leaving the hospital. Reports poor adherence to his medications, even when he does have them. States that the labels get wet and wear off, then he doesn't know what he is taking. Now that he knows which pill makes him urinate frequently (furosemide), he knows he can avoid that one and take his other medications.  Reports that he caught this cold ~2 weeks ago , and since then has been struggling with breathing. Reports SOB when walking short distances. Was doing better before getting sick - had been riding his bike to get around - occasionally needing to get off and walk to relieve SOB.   Current HF Medications: ACE/ARB/ARNI: losartan 25 mg PO daily MRA: spironolactone 12.5 mg PO daily SGLT2i: empagliflozin 10 mg PO daily  Received lasix 40 mg IV BID until 10/14  Prior to admission HF Medications: Diuretic: furosemide 40 mg PO daily ACE/ARB/ARNI: losartan 25 mg PO daily MRA: spironolactone 25 mg PO daily SGLT2i: empaglilflozin 10 mg PO daily  Pertinent Lab Values: Serum creatinine 1.30, BUN 28, Potassium 4.0, Sodium 137, BNP 2251,  tp 62 > 68 Magnesium 2.3 (last admission), A1c 5.7% (July 2024)  Vital Signs: Weight: 154 lbs (admission weight: 152 lbs, 9/20 d/c weight 152 lbs) Blood pressure: 120-140s/90-100s  Heart rate: 60-80s  I/O: net -2.9L yesterday; net -7.0L since admission  Medication Assistance / Insurance Benefits Check: Does the patient have prescription insurance?  Yes Type of insurance plan: [employer] Medicare/Medicaid  Copays are $0 per most recent fills  Outpatient Pharmacy:  Prior to admission outpatient pharmacy: [facility] Is the patient willing to use [facility] TOC pharmacy at discharge? Yes Is the patient willing to transition their outpatient pharmacy to utilize a [facility] outpatient pharmacy?   Pending - will f/u with patient whether there is a family member that could potentially receive his medications via mail order. Will f/u SW plan.     Assessment: 1. Acute on chronic systolic CHF (LVEF 25-30%), due to nonischemic causes including polysubstance abuse, hypertension. NYHA class III symptoms. - Pt at high risk for worsening HF and readmission primarily due to SDOH needs and inability to remain adherent to medication regimen. Discussed ability to tape over medication labels to prevent them from getting wet/wearing off.  - Scr bump today may represent adequate diuresis - per CXR and weight, does not appear that pt was severely volume overloaded on admission. No LE edema on exam. - Agree with addition of MRA  today. Only concern with use of ARNI vs ARB is pill burden - appropriate to continue ARB at this time. - May not be ideal candidate for beta-blocker given current COPD exacerbation and cocaine use. Would need to  evaluate ability to stay adherent to carvedilol BID.  Pt requested new Rx for inhaler for COPD symptoms.   Plan: 1) Medication changes recommended at this time: - Start spironolactone 12.5 mg PO daily - consider increasing to 25 mg PO daily if tolerating - Continue losartan 25 mg PO  daily - Continue empagliflozin 10 mg PO daily - Recommend Rx for furosemide 40 mg PO daily PRN at discharge  2) Patient assistance: - Pt requested insurance phone number to request "food card" - provided from [facility] - Pt will fill medications with TOC prior to discharge. Will coordinate with pharmacy to tape-over labels to waterproof per pt request.   3)  Education  - Initial education complete including necessity to remain adherent to medications to prevent fluid accumulation, worsening heart failure, and readmission. Discussed ability to take furosemide PRN when he notices fluid accumulation. Full education to be completed prior to discharge  Nils Pyle, PharmD PGY1 Pharmacy Resident

## 2023-02-26 ENCOUNTER — Telehealth: Payer: Self-pay

## 2023-02-26 ENCOUNTER — Other Ambulatory Visit (HOSPITAL_COMMUNITY): Payer: Self-pay

## 2023-02-26 NOTE — Transitions of Care (Post Inpatient/ED Visit) (Signed)
02/26/2023  Name: Charles [name] MRN: [id number] DOB: 1970/10/13  Today's TOC FU Call Status: Today's TOC FU Call Status:: Unsuccessful Call (1st Attempt) Unsuccessful Call (1st Attempt) Date: 02/26/23  Attempted to reach the patient regarding the most recent Inpatient/ED visit.  Follow Up Plan: Additional outreach attempts will be made to reach the patient to complete the Transitions of Care (Post Inpatient/ED visit) call.   Deidre Ala, RN Medical illustrator [facility] [phone]

## 2023-02-27 ENCOUNTER — Telehealth: Payer: Self-pay

## 2023-02-27 NOTE — Transitions of Care (Post Inpatient/ED Visit) (Signed)
02/27/2023  Name: Charles [name] MRN: [id number] DOB: April 10, 1971  Today's TOC FU Call Status: Today's TOC FU Call Status:: Successful TOC FU Call Completed TOC FU Call Complete Date: 02/27/23 Patient's Name and Date of Birth confirmed.  Transition Care Management Follow-up Telephone Call Date of Discharge: 02/25/23 Discharge Facility: [facility]) Type of Discharge: Inpatient Admission Primary Inpatient Discharge Diagnosis:: CHF How have you been since you were released from the hospital?: Better Any questions or concerns?: No  Items Reviewed: Did you receive and understand the discharge instructions provided?: Yes Medications obtained,verified, and reconciled?: Yes (Medications Reviewed) Any new allergies since your discharge?: No Dietary orders reviewed?: No Do you have support at home?: No  Medications Reviewed Today: Medications Reviewed Today     Reviewed by [facility], RN (Case Manager) on 02/27/23 at 1642  Med List Status: <None>   Medication Order Taking? Sig Documenting Provider Last Dose Status Informant  albuterol (VENTOLIN HFA) 108 (90 Base) MCG/ACT inhaler [id number]  Inhale 2 puffs into the lungs every 6 (six) hours as needed for wheezing or shortness of breath. Zannie Cove, MD  Active   cyanocobalamin (VITAMIN B12) 1000 MCG tablet [id number]  Take 1 tablet (1,000 mcg total) by mouth daily. Zannie Cove, MD  Active   empagliflozin (JARDIANCE) 10 MG TABS tablet [id number]  Take 1 tablet (10 mg total) by mouth daily before breakfast. Zannie Cove, MD  Active   furosemide (LASIX) 40 MG tablet [id number]  Take 1 tablet (40 mg total) by mouth daily. Zannie Cove, MD  Active   guaiFENesin (MUCINEX) 600 MG 12 hr tablet [id number]  Take 1 tablet (600 mg total) by mouth 2 (two) times daily for 5 days. Zannie Cove, MD  Active   losartan (COZAAR) 25 MG tablet [id number]  Take 1 tablet (25 mg total) by mouth daily. Zannie Cove, MD  Active    spironolactone (ALDACTONE) 25 MG tablet [id number]  Take 0.5 tablets (12.5 mg total) by mouth daily. Zannie Cove, MD  Active   Med List Note Lia Hopping, Marylen Ponto, CPhT 11/16/22 1800): Pt stated at first that he did not know the names of his medications and I asked pt if we went through my list if that would help and then he said nope because I do not really take them anyway            Home Care and Equipment/Supplies: Were Home Health Services Ordered?: No Any new equipment or medical supplies ordered?: No  Functional Questionnaire: Do you need assistance with bathing/showering or dressing?: No Do you need assistance with meal preparation?: No Do you need assistance with eating?: No Do you have difficulty maintaining continence: No Do you need assistance with getting out of bed/getting out of a chair/moving?: No Do you have difficulty managing or taking your medications?: No  Follow up appointments reviewed: PCP Follow-up appointment confirmed?: NA Specialist Hospital Follow-up appointment confirmed?: Yes Date of Specialist follow-up appointment?: 03/04/23 Follow-Up Specialty Provider:: [facility] Do you need transportation to your follow-up appointment?: No Do you understand care options if your condition(s) worsen?: Yes-patient verbalized understanding  SDOH Interventions Today    Flowsheet Row Most Recent Value  SDOH Interventions   Food Insecurity Interventions AMB Referral  Transportation Interventions AMB Referral      SW has already been involved and referred to regarding Homelessness  Deidre Ala, RN RN Care Manager [facility] [phone]

## 2023-03-07 ENCOUNTER — Encounter (HOSPITAL_COMMUNITY): Payer: Medicare HMO

## 2023-04-14 ENCOUNTER — Other Ambulatory Visit: Payer: Self-pay

## 2023-04-14 ENCOUNTER — Encounter (HOSPITAL_COMMUNITY): Payer: Self-pay | Admitting: Internal Medicine

## 2023-04-14 ENCOUNTER — Emergency Department (HOSPITAL_COMMUNITY): Payer: Medicare HMO

## 2023-04-14 ENCOUNTER — Inpatient Hospital Stay (HOSPITAL_COMMUNITY)
Admission: EM | Admit: 2023-04-14 | Discharge: 2023-04-18 | DRG: 557 | Disposition: A | Discharge status: Home / Self Care | Payer: Medicare HMO | Attending: Internal Medicine | Admitting: Internal Medicine

## 2023-04-14 DIAGNOSIS — Z5982 Transportation insecurity: Secondary | ICD-10-CM

## 2023-04-14 DIAGNOSIS — I5023 Acute on chronic systolic (congestive) heart failure: Secondary | ICD-10-CM | POA: Diagnosis not present

## 2023-04-14 DIAGNOSIS — D539 Nutritional anemia, unspecified: Secondary | ICD-10-CM | POA: Diagnosis present

## 2023-04-14 DIAGNOSIS — I5042 Chronic combined systolic (congestive) and diastolic (congestive) heart failure: Secondary | ICD-10-CM | POA: Diagnosis present

## 2023-04-14 DIAGNOSIS — Z7984 Long term (current) use of oral hypoglycemic drugs: Secondary | ICD-10-CM

## 2023-04-14 DIAGNOSIS — I2489 Other forms of acute ischemic heart disease: Secondary | ICD-10-CM | POA: Diagnosis not present

## 2023-04-14 DIAGNOSIS — M1990 Unspecified osteoarthritis, unspecified site: Secondary | ICD-10-CM | POA: Diagnosis present

## 2023-04-14 DIAGNOSIS — R0902 Hypoxemia: Secondary | ICD-10-CM | POA: Diagnosis not present

## 2023-04-14 DIAGNOSIS — R1110 Vomiting, unspecified: Secondary | ICD-10-CM | POA: Diagnosis not present

## 2023-04-14 DIAGNOSIS — Z560 Unemployment, unspecified: Secondary | ICD-10-CM

## 2023-04-14 DIAGNOSIS — Z800 Family history of malignant neoplasm of digestive organs: Secondary | ICD-10-CM

## 2023-04-14 DIAGNOSIS — B182 Chronic viral hepatitis C: Secondary | ICD-10-CM | POA: Diagnosis present

## 2023-04-14 DIAGNOSIS — Z9109 Other allergy status, other than to drugs and biological substances: Secondary | ICD-10-CM

## 2023-04-14 DIAGNOSIS — R9431 Abnormal electrocardiogram [ECG] [EKG]: Secondary | ICD-10-CM | POA: Diagnosis not present

## 2023-04-14 DIAGNOSIS — N179 Acute kidney failure, unspecified: Secondary | ICD-10-CM | POA: Diagnosis present

## 2023-04-14 DIAGNOSIS — F419 Anxiety disorder, unspecified: Secondary | ICD-10-CM | POA: Diagnosis present

## 2023-04-14 DIAGNOSIS — Z1152 Encounter for screening for COVID-19: Secondary | ICD-10-CM | POA: Diagnosis not present

## 2023-04-14 DIAGNOSIS — Z79899 Other long term (current) drug therapy: Secondary | ICD-10-CM

## 2023-04-14 DIAGNOSIS — M6282 Rhabdomyolysis: Secondary | ICD-10-CM | POA: Diagnosis not present

## 2023-04-14 DIAGNOSIS — D696 Thrombocytopenia, unspecified: Secondary | ICD-10-CM | POA: Diagnosis not present

## 2023-04-14 DIAGNOSIS — R112 Nausea with vomiting, unspecified: Principal | ICD-10-CM | POA: Diagnosis present

## 2023-04-14 DIAGNOSIS — J42 Unspecified chronic bronchitis: Secondary | ICD-10-CM

## 2023-04-14 DIAGNOSIS — I214 Non-ST elevation (NSTEMI) myocardial infarction: Secondary | ICD-10-CM

## 2023-04-14 DIAGNOSIS — I4892 Unspecified atrial flutter: Secondary | ICD-10-CM | POA: Diagnosis not present

## 2023-04-14 DIAGNOSIS — I130 Hypertensive heart and chronic kidney disease with heart failure and stage 1 through stage 4 chronic kidney disease, or unspecified chronic kidney disease: Secondary | ICD-10-CM | POA: Diagnosis not present

## 2023-04-14 DIAGNOSIS — Z720 Tobacco use: Secondary | ICD-10-CM

## 2023-04-14 DIAGNOSIS — F1910 Other psychoactive substance abuse, uncomplicated: Secondary | ICD-10-CM | POA: Diagnosis present

## 2023-04-14 DIAGNOSIS — F1410 Cocaine abuse, uncomplicated: Secondary | ICD-10-CM | POA: Diagnosis present

## 2023-04-14 DIAGNOSIS — F1110 Opioid abuse, uncomplicated: Secondary | ICD-10-CM | POA: Diagnosis present

## 2023-04-14 DIAGNOSIS — Z5900 Homelessness unspecified: Secondary | ICD-10-CM | POA: Diagnosis not present

## 2023-04-14 DIAGNOSIS — R0689 Other abnormalities of breathing: Secondary | ICD-10-CM | POA: Diagnosis not present

## 2023-04-14 DIAGNOSIS — F1210 Cannabis abuse, uncomplicated: Secondary | ICD-10-CM | POA: Diagnosis present

## 2023-04-14 DIAGNOSIS — F1490 Cocaine use, unspecified, uncomplicated: Secondary | ICD-10-CM | POA: Diagnosis not present

## 2023-04-14 DIAGNOSIS — J449 Chronic obstructive pulmonary disease, unspecified: Secondary | ICD-10-CM | POA: Diagnosis present

## 2023-04-14 DIAGNOSIS — Z91012 Allergy to eggs: Secondary | ICD-10-CM

## 2023-04-14 DIAGNOSIS — Z5986 Financial insecurity: Secondary | ICD-10-CM | POA: Diagnosis not present

## 2023-04-14 DIAGNOSIS — W19XXXA Unspecified fall, initial encounter: Secondary | ICD-10-CM | POA: Diagnosis not present

## 2023-04-14 DIAGNOSIS — R451 Restlessness and agitation: Secondary | ICD-10-CM | POA: Diagnosis present

## 2023-04-14 DIAGNOSIS — R7989 Other specified abnormal findings of blood chemistry: Secondary | ICD-10-CM | POA: Diagnosis not present

## 2023-04-14 DIAGNOSIS — I10 Essential (primary) hypertension: Secondary | ICD-10-CM | POA: Diagnosis present

## 2023-04-14 DIAGNOSIS — N189 Chronic kidney disease, unspecified: Secondary | ICD-10-CM | POA: Diagnosis present

## 2023-04-14 DIAGNOSIS — R197 Diarrhea, unspecified: Secondary | ICD-10-CM | POA: Diagnosis present

## 2023-04-14 DIAGNOSIS — N182 Chronic kidney disease, stage 2 (mild): Secondary | ICD-10-CM | POA: Diagnosis present

## 2023-04-14 DIAGNOSIS — D619 Aplastic anemia, unspecified: Secondary | ICD-10-CM | POA: Diagnosis not present

## 2023-04-14 DIAGNOSIS — F17210 Nicotine dependence, cigarettes, uncomplicated: Secondary | ICD-10-CM | POA: Diagnosis present

## 2023-04-14 DIAGNOSIS — R918 Other nonspecific abnormal finding of lung field: Secondary | ICD-10-CM | POA: Diagnosis not present

## 2023-04-14 DIAGNOSIS — Z91014 Allergy to mammalian meats: Secondary | ICD-10-CM

## 2023-04-14 DIAGNOSIS — Z801 Family history of malignant neoplasm of trachea, bronchus and lung: Secondary | ICD-10-CM

## 2023-04-14 DIAGNOSIS — R0989 Other specified symptoms and signs involving the circulatory and respiratory systems: Secondary | ICD-10-CM | POA: Diagnosis not present

## 2023-04-14 LAB — BASIC METABOLIC PANEL
Anion gap: 13 (ref 5–15)
BUN: 37 mg/dL — ABNORMAL HIGH (ref 6–20)
CO2: 18 mmol/L — ABNORMAL LOW (ref 22–32)
Calcium: 9 mg/dL (ref 8.9–10.3)
Chloride: 106 mmol/L (ref 98–111)
Creatinine, Ser: 2.75 mg/dL — ABNORMAL HIGH (ref 0.61–1.24)
GFR, Estimated: 27 mL/min — ABNORMAL LOW (ref >60)
Glucose, Bld: 91 mg/dL (ref 70–99)
Potassium: 4.6 mmol/L (ref 3.5–5.1)
Sodium: 137 mmol/L (ref 135–145)

## 2023-04-14 LAB — URINALYSIS, ROUTINE W REFLEX MICROSCOPIC
Bacteria, UA: NONE SEEN
Bilirubin Urine: NEGATIVE
Glucose, UA: NEGATIVE mg/dL
Hgb urine dipstick: NEGATIVE
Ketones, ur: NEGATIVE mg/dL
Leukocytes,Ua: NEGATIVE
Nitrite: NEGATIVE
Protein, ur: 100 mg/dL — AB
Specific Gravity, Urine: 1.017 (ref 1.005–1.030)
pH: 5 (ref 5.0–8.0)

## 2023-04-14 LAB — HEPATIC FUNCTION PANEL
ALT: 34 U/L (ref 0–44)
AST: 54 U/L — ABNORMAL HIGH (ref 15–41)
Albumin: 3.1 g/dL — ABNORMAL LOW (ref 3.5–5.0)
Alkaline Phosphatase: 60 U/L (ref 38–126)
Bilirubin, Direct: 0.2 mg/dL (ref 0.0–0.2)
Indirect Bilirubin: 0.8 mg/dL (ref 0.3–0.9)
Total Bilirubin: 1 mg/dL (ref <1.2)
Total Protein: 6 g/dL — ABNORMAL LOW (ref 6.5–8.1)

## 2023-04-14 LAB — RESP PANEL BY RT-PCR (RSV, FLU A&B, COVID)  RVPGX2
Influenza A by PCR: NEGATIVE
Influenza B by PCR: NEGATIVE
Resp Syncytial Virus by PCR: NEGATIVE
SARS Coronavirus 2 by RT PCR: NEGATIVE

## 2023-04-14 LAB — CREATININE, URINE, RANDOM: Creatinine, Urine: 122 mg/dL

## 2023-04-14 LAB — CBC
HCT: 35.9 % — ABNORMAL LOW (ref 39.0–52.0)
Hemoglobin: 11.8 g/dL — ABNORMAL LOW (ref 13.0–17.0)
MCH: 34.4 pg — ABNORMAL HIGH (ref 26.0–34.0)
MCHC: 32.9 g/dL (ref 30.0–36.0)
MCV: 104.7 fL — ABNORMAL HIGH (ref 80.0–100.0)
Platelets: 110 10*3/uL — ABNORMAL LOW (ref 150–400)
RBC: 3.43 MIL/uL — ABNORMAL LOW (ref 4.22–5.81)
RDW: 17.2 % — ABNORMAL HIGH (ref 11.5–15.5)
WBC: 4.7 10*3/uL (ref 4.0–10.5)
nRBC: 0 % (ref 0.0–0.2)

## 2023-04-14 LAB — MAGNESIUM: Magnesium: 1.9 mg/dL (ref 1.7–2.4)

## 2023-04-14 LAB — TROPONIN I (HIGH SENSITIVITY)
Troponin I (High Sensitivity): 147 ng/L (ref <18)
Troponin I (High Sensitivity): 104 ng/L (ref <18)

## 2023-04-14 LAB — HEMOGLOBIN AND HEMATOCRIT, BLOOD
HCT: 34.1 % — ABNORMAL LOW (ref 39.0–52.0)
Hemoglobin: 11.3 g/dL — ABNORMAL LOW (ref 13.0–17.0)

## 2023-04-14 LAB — TYPE AND SCREEN
ABO/RH(D): A POS
Antibody Screen: NEGATIVE

## 2023-04-14 LAB — LIPASE, BLOOD: Lipase: 20 U/L (ref 11–51)

## 2023-04-14 LAB — BRAIN NATRIURETIC PEPTIDE: B Natriuretic Peptide: 1806.9 pg/mL — ABNORMAL HIGH (ref 0.0–100.0)

## 2023-04-14 LAB — PHOSPHORUS: Phosphorus: 4.4 mg/dL (ref 2.5–4.6)

## 2023-04-14 LAB — SODIUM, URINE, RANDOM: Sodium, Ur: 66 mmol/L

## 2023-04-14 LAB — CK: Total CK: 1094 U/L — ABNORMAL HIGH (ref 49–397)

## 2023-04-14 MED ORDER — ACETAMINOPHEN 650 MG RE SUPP: 650.0000 mg | Freq: Four times a day (QID) | RECTAL | Status: DC | PRN

## 2023-04-14 MED ORDER — LORAZEPAM 2 MG/ML IJ SOLN
1.0000 mg | INTRAMUSCULAR | Status: DC | PRN
  Administered 2023-04-15 – 2023-04-16 (×4): 1 mg via INTRAVENOUS
  Filled 2023-04-14 (×4): qty 1

## 2023-04-14 MED ORDER — LORAZEPAM 2 MG/ML IJ SOLN
2.0000 mg | Freq: Once | INTRAMUSCULAR | Status: AC
  Administered 2023-04-14: 2 mg via INTRAVENOUS
  Filled 2023-04-14: qty 1

## 2023-04-14 MED ORDER — HYDRALAZINE HCL 20 MG/ML IJ SOLN: 10.0000 mg | INTRAMUSCULAR | Status: DC | PRN

## 2023-04-14 MED ORDER — ACETAMINOPHEN 325 MG PO TABS
650.0000 mg | ORAL_TABLET | Freq: Four times a day (QID) | ORAL | Status: DC | PRN
  Administered 2023-04-15: 650 mg via ORAL
  Filled 2023-04-14: qty 2

## 2023-04-14 MED ORDER — LACTATED RINGERS IV BOLUS
1000.0000 mL | Freq: Once | INTRAVENOUS | Status: AC
  Administered 2023-04-14: 1000 mL via INTRAVENOUS

## 2023-04-14 MED ORDER — NICOTINE 21 MG/24HR TD PT24
21.0000 mg | MEDICATED_PATCH | Freq: Every day | TRANSDERMAL | Status: DC
  Administered 2023-04-14 – 2023-04-18 (×5): 21 mg via TRANSDERMAL
  Filled 2023-04-14 (×5): qty 1

## 2023-04-14 MED ORDER — FUROSEMIDE 10 MG/ML IJ SOLN
40.0000 mg | Freq: Once | INTRAMUSCULAR | Status: AC
  Administered 2023-04-14: 40 mg via INTRAVENOUS
  Filled 2023-04-14: qty 4

## 2023-04-14 MED ORDER — ALBUTEROL SULFATE (2.5 MG/3ML) 0.083% IN NEBU
2.5000 mg | INHALATION_SOLUTION | Freq: Four times a day (QID) | RESPIRATORY_TRACT | Status: DC | PRN
  Administered 2023-04-17: 2.5 mg via RESPIRATORY_TRACT
  Filled 2023-04-14: qty 3

## 2023-04-14 MED ORDER — SODIUM CHLORIDE 0.9 % IV BOLUS
1000.0000 mL | Freq: Once | INTRAVENOUS | Status: AC
  Administered 2023-04-14: 1000 mL via INTRAVENOUS

## 2023-04-14 NOTE — ED Triage Notes (Signed)
Pt c/o all over, says he has arthritis. Reports he has been vomiting all day, epigastric pain.  Diarrhea started 30 minutes ago. Also c/o SOB. Used cocaine about 2 hours ago.
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
## 2023-04-14 NOTE — ED Notes (Signed)
Pt now resting with eyes closed after ativan admin. Symmetrical chest rise & fall noted. Remains on monitoring devices
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
## 2023-04-14 NOTE — ED Triage Notes (Signed)
[place] [facility] [place], Per report, the pt was C/o SOB, hx of COPD, lung sounds clear. Cocaine use 2 hours prior. 140 palpated, hr 94, 93% ra, "pain all over from arthritis", CO2 35.
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
## 2023-04-15 DIAGNOSIS — R197 Diarrhea, unspecified: Secondary | ICD-10-CM | POA: Diagnosis not present

## 2023-04-15 DIAGNOSIS — F1490 Cocaine use, unspecified, uncomplicated: Secondary | ICD-10-CM | POA: Diagnosis not present

## 2023-04-15 DIAGNOSIS — R112 Nausea with vomiting, unspecified: Secondary | ICD-10-CM | POA: Diagnosis not present

## 2023-04-15 DIAGNOSIS — M6282 Rhabdomyolysis: Secondary | ICD-10-CM

## 2023-04-15 LAB — COMPREHENSIVE METABOLIC PANEL
ALT: 40 U/L (ref 0–44)
AST: 52 U/L — ABNORMAL HIGH (ref 15–41)
Albumin: 3 g/dL — ABNORMAL LOW (ref 3.5–5.0)
Alkaline Phosphatase: 56 U/L (ref 38–126)
Anion gap: 8 (ref 5–15)
BUN: 27 mg/dL — ABNORMAL HIGH (ref 6–20)
CO2: 22 mmol/L (ref 22–32)
Calcium: 8.2 mg/dL — ABNORMAL LOW (ref 8.9–10.3)
Chloride: 105 mmol/L (ref 98–111)
Creatinine, Ser: 1.54 mg/dL — ABNORMAL HIGH (ref 0.61–1.24)
GFR, Estimated: 54 mL/min — ABNORMAL LOW (ref >60)
Glucose, Bld: 206 mg/dL — ABNORMAL HIGH (ref 70–99)
Potassium: 3.7 mmol/L (ref 3.5–5.1)
Sodium: 135 mmol/L (ref 135–145)
Total Bilirubin: 0.9 mg/dL (ref <1.2)
Total Protein: 6.1 g/dL — ABNORMAL LOW (ref 6.5–8.1)

## 2023-04-15 LAB — PROCALCITONIN: Procalcitonin: 0.31 ng/mL

## 2023-04-15 LAB — CBC
HCT: 34.6 % — ABNORMAL LOW (ref 39.0–52.0)
Hemoglobin: 11.6 g/dL — ABNORMAL LOW (ref 13.0–17.0)
MCH: 33.6 pg (ref 26.0–34.0)
MCHC: 33.5 g/dL (ref 30.0–36.0)
MCV: 100.3 fL — ABNORMAL HIGH (ref 80.0–100.0)
Platelets: 121 10*3/uL — ABNORMAL LOW (ref 150–400)
RBC: 3.45 MIL/uL — ABNORMAL LOW (ref 4.22–5.81)
RDW: 17.5 % — ABNORMAL HIGH (ref 11.5–15.5)
WBC: 7.1 10*3/uL (ref 4.0–10.5)
nRBC: 0 % (ref 0.0–0.2)

## 2023-04-15 MED ORDER — AMLODIPINE BESYLATE 10 MG PO TABS
10.0000 mg | ORAL_TABLET | Freq: Every day | ORAL | Status: DC
  Administered 2023-04-15 – 2023-04-18 (×4): 10 mg via ORAL
  Filled 2023-04-15 (×4): qty 1

## 2023-04-15 MED ORDER — SODIUM CHLORIDE 0.9 % IV SOLN: INTRAVENOUS | Status: AC

## 2023-04-15 NOTE — Progress Notes (Signed)
Patient requesting ice cream. Upon entering room, patient sleeping. Called patient's name x2- patient still sleeping. Ice cream placed on bed side table.

## 2023-04-15 NOTE — Progress Notes (Signed)
   Heart Failure Stewardship Pharmacist Progress Note   PCP: Renaye Rakers, MD PCP-Cardiologist: None    HPI:  52 yo M with a PMH of chronic CHF, HTN, COPD, CKD IIIa, chronic hepatitis C, polysubstance abuse (cocaine and tobacco), and homelessness.   Frequent admissions for HF exacerbations due to noncompliance and cocaine use. Last admitted 02/2023. Arranged follow up appt with CHF clinic after discharge but he was a no-show.   Last ECHO 01/28/23 with EF 25-30%, global hypokinesis, G2DD, RV mildly reduced, trivial MR. cMRI in 05/2021 with EF 28%, normal RV, mildly elevated ECV elevation, non-coronary LGE. Findings consistent with long-standing HTN.   He presented back to the ED on 12/2 with shortness of breath and vomiting. Reports using cocaine 2 hours prior. He was given 3L fluid by the EDP. CXR done after and showed cardiac enlargement and probably interstitial pulmonary edema. BNP 1806.9. He was given furosemide 40 mg IV.   Denies any current shortness of breath. Is laying flat.   Patient reported not taking any medications since his last discharge. Does not like how they make him feel. States that he may take BP medications but will not take lasix after discharge. Says he is living on the streets and carrying around the medications is too much. Reports he'll pee naturally and if the fluid builds up again, he'll just come back to the hospital. Patient refused to discuss further.  Current HF Medications: None  Prior to admission HF Medications: None - not taking furosemide, Jardiance, losartan, or spironolactone  Pertinent Lab Values: Serum creatinine 1.54, BUN 27, Potassium 3.7, Sodium 135, BNP 1806.9, Magnesium 1.9   Vital Signs: Weight: 155 lbs (admission weight: 155 lbs) Blood pressure: 150/90s  Heart rate: 90s  I/O: net +2.7L yesterday; net +2.5L since admission  Medication Assistance / Insurance Benefits Check: Does the patient have prescription insurance?  Yes Type of  insurance plan: [employer] Medicare + [place] Medicaid  Outpatient Pharmacy:  Prior to admission outpatient pharmacy: [facility] Is the patient willing to use [facility] pharmacy at discharge? Yes Is the patient willing to transition their outpatient pharmacy to utilize a [facility] outpatient pharmacy?   No    Assessment: 1. Acute on chronic systolic CHF (LVEF 25-30%), due to NICM - polysubstance use and HTN. NYHA class II symptoms. - He is at high risk for worsening HF and readmission primarily due to SDOH needs and inability to remain adherent to medication regimen. - S/p furosemide 40 mg IV x 1 on 12/2. Strict I/Os and daily weights. Keep K>4 and Mg>2. - Creatinine improved from 2.75 on admission to 1.54 today. Baseline appears to be around 1.2. - Patient states he will only take BP medications. In this case, consider starting Entresto 24/26 mg BID since this will treat his BP and have a diuretic effect. He has stated that he refuses to take any lasix when he's not in the hospital. - Not a great BB candidate with frequent COPD exacerbations and cocaine use.    Plan: 1) Medication changes recommended at this time: - Add Entresto 24/26 mg BID  2) Patient assistance: - None pending, has [place] Medicaid  3)  Education  - Initial education completed - Full education to be completed prior to discharge  Sharen Hones, PharmD, BCPS Heart Failure Engineer, building services Phone [phone]

## 2023-04-15 NOTE — Plan of Care (Signed)

## 2023-04-15 NOTE — Progress Notes (Addendum)
1706: Prn ativan given. Patient with excessive shaking of legs and signs of agitation noted. Patient stated he is upset because case manager came in the room. Patient reports " I don't need no help right now. I don't feel good! Talk to me once I feel better". COW score is a 10. BP 170/112.   1750: prn tylenol given for headache.  1845: repeat BP 162/109.

## 2023-04-15 NOTE — Progress Notes (Incomplete)
Heart Failure Nurse Navigator Progress Note  PCP: Renaye Rakers, MD PCP-Cardiologist: *** Admission Diagnosis: *** Admitted from: ***  Presentation:   Charles [name] presented with ***  ECHO/ LVEF: ***  Clinical Course:  Past Medical History:  Diagnosis Date   Aplastic anemia (HCC)    Arthritis    "left ankle" (10/17/2014)   Bilateral pneumonia 10/17/2014   Hepatitis    "think it was B" (10/17/2014)   History of blood transfusion "several"   "related to aplastic anemia"   Hypertension    Laceration of spleen    s/p embolization   Polysubstance abuse (HCC)    cocaine, benzo's, opiates, THC   Sleep apnea    "wore mask in prison; 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" (10/17/2014)     Social History   Socioeconomic History   Marital status: Single    Spouse name: Not on file   Number of children: Not on file   Years of education: Not on file   Highest education level: Not on file  Occupational History   Occupation: Unemployed  Tobacco Use   Smoking status: Every Day    Current packs/day: 0.25    Average packs/day: 0.3 packs/day for 30.0 years (7.5 ttl pk-yrs)    Types: Cigarettes, Cigars   Smokeless tobacco: Former  Building services engineer status: Never Used  Substance and Sexual Activity   Alcohol use: Yes    Comment: occ   Drug use: Yes    Frequency: 7.0 times per week    Types: Cocaine, Marijuana    Comment: Uses cocaine everyday   Sexual activity: Not Currently  Other Topics Concern   Not on file  Social History Narrative   Not on file   Social Determinants of Health   Financial Resource Strain: High Risk (02/24/2023)   Overall Financial Resource Strain (CARDIA)    Difficulty of Paying Living Expenses: Very hard  Food Insecurity: Food Insecurity Present (04/14/2023)   Hunger Vital Sign    Worried About Running Out of Food in the Last Year: Sometimes true    Ran Out of Food in the Last Year: Sometimes true  Transportation Needs: Unmet Transportation Needs (04/14/2023)   PRAPARE  - Administrator, Civil Service (Medical): Yes    Lack of Transportation (Non-Medical): Yes  Physical Activity: Not on file  Stress: Not on file  Social Connections: Unknown (09/24/2021)   Received from [facility]   Social Network    Social Network: Not on file   Education Assessment and Provision:  Detailed education and instructions provided on heart failure disease management including the following:  Signs and symptoms of Heart Failure When to call the physician Importance of daily weights Low sodium diet Fluid restriction Medication management Anticipated future follow-up appointments  Patient education given on each of the above topics.  Patient acknowledges understanding via teach back method and acceptance of all instructions.  Education Materials:  "Living Better With Heart Failure" Booklet, HF zone tool, & Daily Weight Tracker Tool.  Patient has scale at home: *** Patient has pill box at home: ***    High Risk Criteria for Readmission and/or Poor Patient Outcomes: Heart failure hospital admissions (last 6 months): ***  No Show rate: *** Difficult social situation: *** Demonstrates medication adherence: *** Primary Language: *** Literacy level: ***  Barriers of Care:   ***  Considerations/Referrals:   Referral made to Heart Failure Pharmacist Stewardship: *** Referral made to Heart Failure CSW/NCM TOC: ***  Referral made to Heart & Vascular TOC clinic: ***  Items for Follow-up on DC/TOC: ***   ***

## 2023-04-15 NOTE — TOC Initial Note (Signed)
Transition of Care [facility]) - Initial/Assessment Note    Patient Details  Name: Charles [name] MRN: [id number] Date of Birth: [birth year]  Transition of Care [facility]) CM/SW Contact:    Leone Haven, RN Phone Number: 04/15/2023, 4:49 PM  Clinical Narrative:                 Homeless, has PCP and insurance on file, states has no HH services in place at this time or DME at home. He states he does not want to talk to a Case Manager or a Counselor, He will most likely need a cab voucher or bus pass at dc.   Pta self ambulatory.   Expected Discharge Plan: Homeless Shelter Barriers to Discharge: Continued Medical Work up   Patient Goals and CMS Choice Patient states their goals for this hospitalization and ongoing recovery are:: homeless   Choice offered to / list presented to : NA      Expected Discharge Plan and Services In-house Referral: NA Discharge Planning Services: CM Consult Post Acute Care Choice: NA Living arrangements for the past 2 months: Homeless Shelter                 DME Arranged: N/A DME Agency: NA       HH Arranged: NA          Prior Living Arrangements/Services Living arrangements for the past 2 months: Homeless Shelter Lives with::  (Homeless) Patient language and need for interpreter reviewed:: Yes Do you feel safe going back to the place where you live?: Yes      Need for Family Participation in Patient Care: No (Comment) Care giver support system in place?: No (comment) Current home services:  (NA) Criminal Activity/Legal Involvement Pertinent to Current Situation/Hospitalization: No - Comment as needed  Activities of Daily Living   ADL Screening (condition at time of admission) Independently performs ADLs?: Yes (appropriate for developmental age) Is the patient deaf or have difficulty hearing?: No Does the patient have difficulty seeing, even when wearing glasses/contacts?: Yes Does the patient have difficulty concentrating, remembering,  or making decisions?: No  Permission Sought/Granted                  Emotional Assessment Appearance:: Appears stated age Attitude/Demeanor/Rapport: Angry Affect (typically observed): Agitated Orientation: : Oriented to Self, Oriented to Place, Oriented to  Time, Oriented to Situation   Psych Involvement: No (comment)  Admission diagnosis:  NSTEMI (non-ST elevated myocardial infarction) (HCC) [I21.4] AKI (acute kidney injury) (HCC) [N17.9] Cocaine use [F14.90] Nausea and vomiting [R11.2] Nausea and vomiting, unspecified vomiting type [R11.2] Patient Active Problem List   Diagnosis Date Noted   Nausea vomiting and diarrhea 04/14/2023   Macrocytic anemia 02/22/2023   Chronic kidney disease, stage III (moderate) (HCC) 02/22/2023   Acute on chronic systolic CHF (congestive heart failure) (HCC) 01/27/2023   Prolonged QT interval 01/27/2023   Elevated blood pressure reading with diagnosis of hypertension 11/27/2022   Hypertensive crisis 11/26/2022   Generalized weakness 11/26/2022   Laceration of hand 11/16/2022   COPD (chronic obstructive pulmonary disease) (HCC) 10/03/2022   CHF (congestive heart failure) (HCC) 10/02/2022   Acute hypoxic respiratory failure (HCC) 09/03/2022   Chronic pain 08/13/2022   Acute on chronic congestive heart failure (HCC) 08/10/2022   Hypertension 07/22/2022   Acute on chronic systolic congestive heart failure (HCC) 02/28/2022   Elevated troponin 02/28/2022   CKD (chronic kidney disease), stage II 02/28/2022   Cocaine abuse (HCC) 02/28/2022   Bacteremia  05/27/2021   Acute on chronic systolic and diastolic CHF (congestive heart failure) (HCC) 05/27/2021   Hypertensive urgency 05/26/2021   Polysubstance abuse (HCC) 05/26/2021   COPD with acute exacerbation (HCC) 05/26/2021   Chronic hepatitis C (HCC) 05/26/2021   Homelessness 05/26/2021   Seizure (HCC) 06/06/2020   Hypokalemia 06/22/2019   Splenic laceration, initial encounter 08/02/2016    Thrombocytopenia (HCC) 10/19/2014   Tobacco abuse 10/19/2014   Acute kidney injury superimposed on chronic kidney disease (HCC) 10/19/2014   PCP:  Renaye Rakers, MD Pharmacy:   [facility] [phone] [place] Otto, [place] - [address] AT [facility] [address] [place] [address] Phone: [phone] Fax: [phone]  [facility][id number] [place] Otto, [place] - [address] AT [facility] OF [address] [place] [address] Phone: [phone] Fax: [phone]  [facility] [address] [place] [address] Phone: [phone] Fax: [phone]  [place] - [facility] [address] [place] [address] Phone: [phone] Fax: [phone]  [facility] [address] [place] [address] Phone: [phone] Fax: [phone]     Social Determinants of Health (SDOH) Social History: SDOH Screenings   Food Insecurity: Food Insecurity Present (04/14/2023)  Housing: High Risk (04/14/2023)  Transportation Needs: Unmet Transportation Needs (04/14/2023)  Utilities: At Risk (04/14/2023)  Alcohol Screen: Medium Risk (02/24/2023)  Financial Resource Strain: High Risk (02/24/2023)  Social Connections: Unknown (09/24/2021)   [facility] from [facility]  Tobacco Use: High Risk (04/14/2023)   SDOH Interventions:     Readmission Risk Interventions    01/29/2023    8:43 AM 10/04/2022   11:27 AM 08/12/2022    4:45 PM  Readmission Risk Prevention Plan  Transportation Screening Complete Complete Complete  Medication Review (RN Care Manager) Complete Complete Complete  PCP or Specialist appointment within 3-5 days of discharge Complete Complete Complete  HRI or Home Care Consult Complete Complete Complete  SW Recovery Care/Counseling Consult  Complete Complete  Palliative Care Screening  Not Applicable Not Applicable Not Applicable  Skilled Nursing Facility Not Applicable Not Applicable Not Applicable

## 2023-04-15 NOTE — Progress Notes (Signed)
PROGRESS NOTE  Shameer Music    DOB: 1970/08/03, 52 y.o.  NWG:[id number]    Code Status: Full Code   DOA: 04/14/2023   LOS: 1   Brief hospital course  Jamyrion Venturo is a 52 y.o. male with medical history significant of hypertension, systolic congestive heart failure last EF 25 to 30% with grade 2 diastolic dysfunction, aplastic anemia, hepatitis, arthritis, and polysubstance abuse (cocaine, opiates, THC, tobacco) who presents with complaints of shortness of breath. Admits to using cocaine prior to coming to the hospital.   Emergency department patient was noted to be afebrile blood pressures elevated up to 148/109, and all other vital signs maintained.  Labs significant for hemoglobin 11.8, MCV 104.7, platelets 110, CO2 18, BUN 37, creatinine 2.75, anion gap 13, and high-sensitivity troponin of 147->103.   Chest x-ray revealed enlargement of the cardiopericardial silhouette with vascular congestion and probable interstitial pulmonary edema.  Patient had been given initially 3 L of normal saline IV fluids and Ativan 2 mg IV.  Patient was admitted to medicine service for further workup and management of rhabdomyolysis as outlined in detail below.  04/15/23 -stable, CK elevated and on IV fluids  Assessment & Plan  Principal Problem:   Nausea vomiting and diarrhea Active Problems:   Elevated troponin   Acute kidney injury superimposed on chronic kidney disease (HCC)   Acute on chronic systolic congestive heart failure (HCC)   COPD (chronic obstructive pulmonary disease) (HCC)   Prolonged QT interval   Macrocytic anemia   Thrombocytopenia (HCC)   Hypertension   Chronic hepatitis C (HCC)   Cocaine abuse (HCC)  Nausea, vomiting, and diarrhea- appears to have about resolved. RVP negative.     Elevated troponin Acute on chronic.  High-sensitivity troponin of 147->103. EKG without significant ischemic changes. Demand ischemia with severe hypertension and cocaine use.    Acute kidney  injury superimposed on chronic kidney disease On admission creatinine elevated up to 2.75 with BUN 37.  Baseline creatinine previously noted to be 1.3.  Patient had been given 3 L of IV fluid by the ED provider.  Greatly improved to 1.5 today - continue IV fluids  Rhabdomyolysis- CK elevated >1,000 -recheck CK tomorrow   Heart failure with reduced fraction Acute on chronic.  Chest x-ray concerning for enlarged cardiac silhouette with possible interstitial edema.  Last echocardiogram noted EF to be 25 to 30% with grade 2 diastolic dysfunction in 01/2023.  It seems patient had been given the 3 L of IV fluids after chest x-ray have been obtained patient noted to have interstitial edema.BNP elevated and roughly around baseline 1,800 and denies respiratory symptoms.  -Daily weights   COPD -Continue breathing treatments as needed for shortness of breath   Prolonged QT interval Chronic.  QTc noted to be 539 on admission. -Avoid QT prolonging medications. -Correct any electrolyte abnormalities   Macrocytic anemia- stable. 11.6 hgb today   Thrombocytopenia- stable. 121 today -Continue to monitor   Essential hypertension - starting amlodipine given several contraindications to home meds   Chronic hepatitis C Patient HCV quantitative 7,410,000 IU/ml 01/28/2023. -Needs outpatient referral to gastroenterology   Cocaine abuse Patient had admitted to recent cocaine use.  Records note multiple positive drug screens dating back to 2018. -Continue to counsel need of cessation of cocaine   Tobacco abuse Patient still reports smoking cigarettes. -Continue to counsel need of cessation of tobacco abuse. -Nicotine patch offered  Body mass index is 25.09 kg/m.  VTE ppx: SCDs  Start: 04/14/23 0729  Diet:     Diet   Diet Heart Fluid consistency: Thin   Consultants: None   Subjective 04/15/23    Pt reports feeling unwell. Describes full body aches. Unable to further detail.   Objective    Vitals:   04/14/23 1544 04/14/23 2043 04/15/23 0030 04/15/23 0452  BP: (!) 150/87 (!) 182/119 (!) 159/111 (!) 133/90  Pulse: 81 (!) 104 (!) 103 98  Resp: 16 18 18 20   Temp: 98.1 F (36.7 C) 98.6 F (37 C) 98.6 F (37 C) (!) 101 F (38.3 C)  TempSrc: Oral Oral Oral Oral  SpO2: 100% 100% 94% 95%  Weight:    70.5 kg    Intake/Output Summary (Last 24 hours) at 04/15/2023 0802 Last data filed at 04/15/2023 0454 Gross per 24 hour  Intake 2480 ml  Output 1500 ml  Net 980 ml   Filed Weights   04/15/23 0452  Weight: 70.5 kg     Physical Exam:  General: awake, alert, NAD HEENT: atraumatic, clear conjunctiva, anicteric sclera, MMM, hearing grossly normal Respiratory: normal respiratory effort. Cardiovascular: quick capillary refill, normal S1/S2, RRR, no JVD, murmurs Gastrointestinal: soft, NT, ND Nervous: A&O x3. no gross focal neurologic deficits, normal speech Extremities: moves all equally, no edema, normal tone Skin: dry, intact, normal temperature, normal color. No rashes, lesions or ulcers on exposed skin Psychiatry: normal mood, congruent affect  Labs   I have personally reviewed the following labs and imaging studies CBC    Component Value Date/Time   WBC 7.1 04/15/2023 0302   RBC 3.45 (L) 04/15/2023 0302   HGB 11.6 (L) 04/15/2023 0302   HCT 34.6 (L) 04/15/2023 0302   HCT 30.7 (L) 06/23/2019 0325   PLT 121 (L) 04/15/2023 0302   MCV 100.3 (H) 04/15/2023 0302   MCH 33.6 04/15/2023 0302   MCHC 33.5 04/15/2023 0302   RDW 17.5 (H) 04/15/2023 0302   LYMPHSABS 0.7 01/27/2023 1626   MONOABS 0.4 01/27/2023 1626   EOSABS 0.0 01/27/2023 1626   BASOSABS 0.0 01/27/2023 1626      Latest Ref Rng & Units 04/15/2023    3:02 AM 04/14/2023    3:30 AM 02/25/2023    3:45 AM  BMP  Glucose 70 - 99 mg/dL 161  91  096   BUN 6 - 20 mg/dL 27  37  28   Creatinine 0.61 - 1.24 mg/dL 0.45  4.09  8.11   Sodium 135 - 145 mmol/L 135  137  137   Potassium 3.5 - 5.1 mmol/L 3.7  4.6  4.0    Chloride 98 - 111 mmol/L 105  106  103   CO2 22 - 32 mmol/L 22  18  25    Calcium 8.9 - 10.3 mg/dL 8.2  9.0  9.1     DG Chest Port 1 View  Result Date: 04/14/2023 CLINICAL DATA:  Vomiting. EXAM: PORTABLE CHEST 1 VIEW COMPARISON:  02/21/2023 FINDINGS: The cardio pericardial silhouette is enlarged. Vascular congestion noted with probable interstitial pulmonary edema. No focal consolidation. No pneumothorax or pleural effusion. No acute bony abnormality. Telemetry leads overlie the chest. IMPRESSION: Enlargement of the cardiopericardial silhouette with vascular congestion and probable interstitial pulmonary edema. Electronically Signed   By: Kennith Center M.D.   On: 04/14/2023 04:20    Disposition Plan & Communication  Patient status: Inpatient  Admitted From: Home Planned disposition location: Home Anticipated discharge date: 12/4 pending clinical improvement of renal function, CK  Family Communication: none at bedside  Author: Leeroy Bock, DO [facility] 04/15/2023, 8:02 AM   Available by Epic secure chat 7AM-7PM. If 7PM-7AM, please contact night-coverage.  [facility] contact information found on [email or web address].

## 2023-04-15 NOTE — Plan of Care (Signed)

## 2023-04-15 NOTE — Progress Notes (Signed)
Eggs on patient's tray. Patient has allergy to eggs and it is listed in chart. Dietary contacted to bring another tray.

## 2023-04-16 DIAGNOSIS — R112 Nausea with vomiting, unspecified: Secondary | ICD-10-CM | POA: Diagnosis not present

## 2023-04-16 DIAGNOSIS — R197 Diarrhea, unspecified: Secondary | ICD-10-CM | POA: Diagnosis not present

## 2023-04-16 LAB — UREA NITROGEN, URINE: Urea Nitrogen, Ur: 903 mg/dL

## 2023-04-16 LAB — BASIC METABOLIC PANEL
Anion gap: 7 (ref 5–15)
BUN: 13 mg/dL (ref 6–20)
CO2: 23 mmol/L (ref 22–32)
Calcium: 8.6 mg/dL — ABNORMAL LOW (ref 8.9–10.3)
Chloride: 105 mmol/L (ref 98–111)
Creatinine, Ser: 1.07 mg/dL (ref 0.61–1.24)
GFR, Estimated: >60 mL/min (ref >60)
Glucose, Bld: 79 mg/dL (ref 70–99)
Potassium: 4.5 mmol/L (ref 3.5–5.1)
Sodium: 135 mmol/L (ref 135–145)

## 2023-04-16 LAB — CK: Total CK: 211 U/L (ref 49–397)

## 2023-04-16 MED ORDER — HYDRALAZINE HCL 20 MG/ML IJ SOLN
10.0000 mg | Freq: Three times a day (TID) | INTRAMUSCULAR | Status: DC | PRN
  Administered 2023-04-16: 10 mg via INTRAVENOUS
  Filled 2023-04-16: qty 1

## 2023-04-16 MED ORDER — LABETALOL HCL 5 MG/ML IV SOLN
10.0000 mg | INTRAVENOUS | Status: DC | PRN
  Administered 2023-04-16 (×2): 10 mg via INTRAVENOUS
  Filled 2023-04-16 (×2): qty 4

## 2023-04-16 MED ORDER — GUAIFENESIN-DM 100-10 MG/5ML PO SYRP
5.0000 mL | ORAL_SOLUTION | ORAL | Status: DC | PRN
  Administered 2023-04-16: 5 mL via ORAL
  Filled 2023-04-16: qty 5

## 2023-04-16 MED ORDER — HYDRALAZINE HCL 20 MG/ML IJ SOLN
20.0000 mg | Freq: Three times a day (TID) | INTRAMUSCULAR | Status: DC | PRN
Start: 2023-04-16 — End: 2023-04-16

## 2023-04-16 NOTE — Plan of Care (Signed)
  Problem: Nutrition: Goal: Adequate nutrition will be maintained Outcome: Progressing   Problem: Coping: Goal: Level of anxiety will decrease Outcome: Progressing   Problem: Elimination: Goal: Will not experience complications related to bowel motility Outcome: Progressing   

## 2023-04-16 NOTE — Plan of Care (Signed)
  Problem: Nutrition: Goal: Adequate nutrition will be maintained Outcome: Completed/Met   Problem: Elimination: Goal: Will not experience complications related to urinary retention Outcome: Completed/Met   Problem: Pain Management: Goal: General experience of comfort will improve Outcome: Completed/Met   Problem: Safety: Goal: Ability to remain free from injury will improve Outcome: Completed/Met   Problem: Skin Integrity: Goal: Risk for impaired skin integrity will decrease Outcome: Completed/Met

## 2023-04-16 NOTE — Progress Notes (Signed)
Pt refused to have skin assessment done at this time.

## 2023-04-16 NOTE — Progress Notes (Signed)
Heart Failure Navigator Progress Note  Assessed for Heart & Vascular TOC clinic readiness.  Patient declined a hospital follow up with HF TOC, stated if he gets sick again he will just come back to the ED. Navigator attempted to educate on the sign and symptoms of heart failure, daily weights, diet/ fluid restrictions, taking all medications as prescribed and attending all medical appointments, however patient just rolled over and went back to sleep. .   Navigator available for reassessment of patient.   Rhae Hammock, BSN, Scientist, clinical (histocompatibility and immunogenetics) Only

## 2023-04-16 NOTE — Progress Notes (Signed)
TRH night cross cover note:   I was notified by RN of the patient's most recent blood pressure of 162/109, with heart rate in the low 100s.  He is maintaining oxygen saturations of 100% on room air.  The patient reports a mild headache at this time, in the absence of any reported additional acute symptoms  including no report of acute focal neurodeficits.  I subsequently ordered prn IV labetalol for systolic blood pressure greater than 160 or diastolic blood pressure greater than 100 mmHg.     Newton Pigg, DO Hospitalist

## 2023-04-16 NOTE — Progress Notes (Signed)
PROGRESS NOTE  Charles [name]:[id number] DOB: [birth year] DOA: 04/14/2023 PCP: Renaye Rakers, MD  HPI/Recap of past 24 hours: Charles [name] is a 52 y.o. male with medical history significant of hypertension, systolic congestive heart failure last EF 25 to 30% with grade 2 diastolic dysfunction, aplastic anemia, hepatitis, arthritis, and polysubstance abuse (cocaine, opiates, THC, tobacco) who presents with complaints of shortness of breath. Admits to using cocaine prior to coming to the hospital. VSS except for uncontrolled BP. Labs significant for creatinine 2.75, anion gap 13, and high-sensitivity troponin of 147->103. Chest x-ray revealed enlargement of the cardiopericardial silhouette with vascular congestion and probable interstitial pulmonary edema.  Patient had been given initially 3 L of normal saline IV fluids and Ativan 2 mg IV. Patient was admitted to medicine service for further workup and management of rhabdomyolysis.   Today, patient reported " nerve pain in BLE", denied any other new complaints.   Assessment/Plan: Principal Problem:   Nausea vomiting and diarrhea Active Problems:   Elevated troponin   Acute kidney injury superimposed on chronic kidney disease (HCC)   Acute on chronic systolic congestive heart failure (HCC)   COPD (chronic obstructive pulmonary disease) (HCC)   Prolonged QT interval   Macrocytic anemia   Thrombocytopenia (HCC)   Hypertension   Chronic hepatitis C (HCC)   Cocaine abuse (HCC)     Elevated troponin High-sensitivity troponin of 147->103 EKG without significant ischemic changes Likely from demand ischemia with uncontrolled hypertension and cocaine use.    Acute kidney injury superimposed on chronic kidney disease stage 2 On admission creatinine elevated up to 2.75 with BUN 37 Baseline creatinine previously noted to be 1.3 Continue IV fluids   Rhabdomyolysis CK elevated >1,000--->211   Heart failure with reduced fraction Acute  on chronic Chest x-ray concerning for enlarged cardiac silhouette with possible interstitial edema Last echocardiogram noted EF to be 25 to 30% with grade 2 diastolic dysfunction in 01/2023 BNP elevated and roughly around baseline 1,800   COPD Continue breathing treatments as needed for shortness of breath   Prolonged QT interval Chronic.  QTc noted to be 539 on admission Avoid QT prolonging medications. Correct any electrolyte abnormalities   Macrocytic anemia- stable   Thrombocytopenia- stable  Essential hypertension Continue amlodipine   Chronic hepatitis C Patient HCV quantitative 7,410,000 IU/ml 01/28/2023. Needs outpatient referral to gastroenterology   Cocaine abuse Patient had admitted to recent cocaine use.  Records note multiple positive drug screens dating back to 2018. Continue to counsel need of cessation of cocaine   Tobacco abuse Patient still reports smoking cigarettes. Continue to counsel need of cessation of tobacco abuse. Nicotine patch offered    Estimated body mass index is 25.94 kg/m as calculated from the following:   Height as of 02/21/23: 5\' 6"  (1.676 m).   Weight as of this encounter: 72.9 kg.     Code Status: Full  Family Communication: None at bedside  Disposition Plan: Status is: Inpatient Remains inpatient appropriate because: level of care      Consultants: None  Procedures: None  Antimicrobials: None  DVT prophylaxis: SCD, allergic to pork derived products   Objective: Vitals:   04/16/23 1105 04/16/23 1713 04/16/23 1752 04/16/23 1955  BP: (!) 137/92 (!) 131/92  (!) 125/90  Pulse: 85 94  96  Resp:  18  16  Temp:  98.2 F (36.8 C)  98.6 F (37 C)  TempSrc:  Oral  Oral  SpO2: 98% 98% 93% 95%  Weight:        Intake/Output Summary (Last 24 hours) at 04/16/2023 2010 Last data filed at 04/16/2023 1900 Gross per 24 hour  Intake 1160 ml  Output 4550 ml  Net -3390 ml   Filed Weights   04/15/23 0452 04/16/23 0032   Weight: 70.5 kg 72.9 kg    Exam: General: NAD, unkempt Cardiovascular: S1, S2 present Respiratory: CTAB Abdomen: Soft, nontender, nondistended, bowel sounds present Musculoskeletal: No bilateral pedal edema noted Skin: Normal Psychiatry: Fair mood     Data Reviewed: CBC: Recent Labs  Lab 04/14/23 0330 04/14/23 0911 04/15/23 0302  WBC 4.7  --  7.1  HGB 11.8* 11.3* 11.6*  HCT 35.9* 34.1* 34.6*  MCV 104.7*  --  100.3*  PLT 110*  --  121*   Basic Metabolic Panel: Recent Labs  Lab 04/14/23 0330 04/14/23 0911 04/15/23 0302 04/16/23 1155  NA 137  --  135 135  K 4.6  --  3.7 4.5  CL 106  --  105 105  CO2 18*  --  22 23  GLUCOSE 91  --  206* 79  BUN 37*  --  27* 13  CREATININE 2.75*  --  1.54* 1.07  CALCIUM 9.0  --  8.2* 8.6*  MG  --  1.9  --   --   PHOS  --  4.4  --   --    GFR: Estimated Creatinine Clearance: 73.7 mL/min (by C-G formula based on SCr of 1.07 mg/dL). Liver Function Tests: Recent Labs  Lab 04/14/23 0911 04/15/23 0302  AST 54* 52*  ALT 34 40  ALKPHOS 60 56  BILITOT 1.0 0.9  PROT 6.0* 6.1*  ALBUMIN 3.1* 3.0*   Recent Labs  Lab 04/14/23 0911  LIPASE 20   No results for input(s): "AMMONIA" in the last 168 hours. Coagulation Profile: No results for input(s): "INR", "PROTIME" in the last 168 hours. Cardiac Enzymes: Recent Labs  Lab 04/14/23 0911 04/16/23 1155  CKTOTAL 1,094* 211   BNP (last 3 results) No results for input(s): "PROBNP" in the last 8760 hours. HbA1C: No results for input(s): "HGBA1C" in the last 72 hours. CBG: No results for input(s): "GLUCAP" in the last 168 hours. Lipid Profile: No results for input(s): "CHOL", "HDL", "LDLCALC", "TRIG", "CHOLHDL", "LDLDIRECT" in the last 72 hours. Thyroid Function Tests: No results for input(s): "TSH", "T4TOTAL", "FREET4", "T3FREE", "THYROIDAB" in the last 72 hours. Anemia Panel: No results for input(s): "VITAMINB12", "FOLATE", "FERRITIN", "TIBC", "IRON", "RETICCTPCT" in the last  72 hours. Urine analysis:    Component Value Date/Time   COLORURINE YELLOW 04/14/2023 1037   APPEARANCEUR CLEAR 04/14/2023 1037   LABSPEC 1.017 04/14/2023 1037   PHURINE 5.0 04/14/2023 1037   GLUCOSEU NEGATIVE 04/14/2023 1037   HGBUR NEGATIVE 04/14/2023 1037   BILIRUBINUR NEGATIVE 04/14/2023 1037   KETONESUR NEGATIVE 04/14/2023 1037   PROTEINUR 100 (A) 04/14/2023 1037   UROBILINOGEN 1.0 03/10/2015 1203   NITRITE NEGATIVE 04/14/2023 1037   LEUKOCYTESUR NEGATIVE 04/14/2023 1037   Sepsis Labs: @LABRCNTIP (procalcitonin:4,lacticidven:4)  ) Recent Results (from the past 240 hour(s))  Resp panel by RT-PCR (RSV, Flu A&B, Covid) Anterior Nasal Swab     Status: None   Collection Time: 04/14/23  5:47 PM   Specimen: Anterior Nasal Swab  Result Value Ref Range Status   SARS Coronavirus 2 by RT PCR NEGATIVE NEGATIVE Final   Influenza A by PCR NEGATIVE NEGATIVE Final   Influenza B by PCR NEGATIVE NEGATIVE Final    Comment: (NOTE) The Xpert  Xpress SARS-CoV-2/FLU/RSV plus assay is intended as an aid in the diagnosis of influenza from Nasopharyngeal swab specimens and should not be used as a sole basis for treatment. Nasal washings and aspirates are unacceptable for Xpert Xpress SARS-CoV-2/FLU/RSV testing.  Fact Sheet for Patients: BloggerCourse.com  Fact Sheet for Healthcare Providers: SeriousBroker.it  This test is not yet approved or cleared by the [place] FDA and has been authorized for detection and/or diagnosis of SARS-CoV-2 by FDA under an Emergency Use Authorization (EUA). This EUA will remain in effect (meaning this test can be used) for the duration of the COVID-19 declaration under Section 564(b)(1) of the Act, 21 U.S.C. section 360bbb-3(b)(1), unless the authorization is terminated or revoked.     Resp Syncytial Virus by PCR NEGATIVE NEGATIVE Final    Comment: (NOTE) Fact Sheet for  Patients: BloggerCourse.com  Fact Sheet for Healthcare Providers: SeriousBroker.it  This test is not yet approved or cleared by the [place] FDA and has been authorized for detection and/or diagnosis of SARS-CoV-2 by FDA under an Emergency Use Authorization (EUA). This EUA will remain in effect (meaning this test can be used) for the duration of the COVID-19 declaration under Section 564(b)(1) of the Act, 21 U.S.C. section 360bbb-3(b)(1), unless the authorization is terminated or revoked.  Performed at [facility], [address], [place], [place] [address]       Studies: No results found.  Scheduled Meds:  amLODipine  10 mg Oral Daily   nicotine  21 mg Transdermal Daily    Continuous Infusions:   LOS: 2 days     Briant Cedar, MD [facility]  If 7PM-7AM, please contact night-coverage [email or web address] 04/16/2023, 8:10 PM

## 2023-04-17 DIAGNOSIS — R112 Nausea with vomiting, unspecified: Secondary | ICD-10-CM | POA: Diagnosis not present

## 2023-04-17 DIAGNOSIS — R197 Diarrhea, unspecified: Secondary | ICD-10-CM | POA: Diagnosis not present

## 2023-04-17 LAB — CBC WITH DIFFERENTIAL/PLATELET
Abs Immature Granulocytes: 0.01 10*3/uL (ref 0.00–0.07)
Basophils Absolute: 0 10*3/uL (ref 0.0–0.1)
Basophils Relative: 0 %
Eosinophils Absolute: 0 10*3/uL (ref 0.0–0.5)
Eosinophils Relative: 1 %
HCT: 39.1 % (ref 39.0–52.0)
Hemoglobin: 13.3 g/dL (ref 13.0–17.0)
Immature Granulocytes: 0 %
Lymphocytes Relative: 20 %
Lymphs Abs: 0.9 10*3/uL (ref 0.7–4.0)
MCH: 34.2 pg — ABNORMAL HIGH (ref 26.0–34.0)
MCHC: 34 g/dL (ref 30.0–36.0)
MCV: 100.5 fL — ABNORMAL HIGH (ref 80.0–100.0)
Monocytes Absolute: 0.7 10*3/uL (ref 0.1–1.0)
Monocytes Relative: 15 %
Neutro Abs: 2.9 10*3/uL (ref 1.7–7.7)
Neutrophils Relative %: 64 %
Platelets: 139 10*3/uL — ABNORMAL LOW (ref 150–400)
RBC: 3.89 MIL/uL — ABNORMAL LOW (ref 4.22–5.81)
RDW: 17.1 % — ABNORMAL HIGH (ref 11.5–15.5)
WBC: 4.6 10*3/uL (ref 4.0–10.5)
nRBC: 0 % (ref 0.0–0.2)

## 2023-04-17 LAB — BASIC METABOLIC PANEL
Anion gap: 7 (ref 5–15)
BUN: 15 mg/dL (ref 6–20)
CO2: 23 mmol/L (ref 22–32)
Calcium: 8.6 mg/dL — ABNORMAL LOW (ref 8.9–10.3)
Chloride: 106 mmol/L (ref 98–111)
Creatinine, Ser: 1.15 mg/dL (ref 0.61–1.24)
GFR, Estimated: >60 mL/min (ref >60)
Glucose, Bld: 104 mg/dL — ABNORMAL HIGH (ref 70–99)
Potassium: 3.9 mmol/L (ref 3.5–5.1)
Sodium: 136 mmol/L (ref 135–145)

## 2023-04-17 MED ORDER — LORAZEPAM 1 MG PO TABS
1.0000 mg | ORAL_TABLET | Freq: Four times a day (QID) | ORAL | Status: DC | PRN
  Administered 2023-04-17 – 2023-04-18 (×2): 1 mg via ORAL
  Filled 2023-04-17 (×2): qty 1

## 2023-04-17 MED ORDER — SALINE SPRAY 0.65 % NA SOLN
1.0000 | NASAL | Status: DC | PRN
  Filled 2023-04-17: qty 44

## 2023-04-17 NOTE — Progress Notes (Signed)
PT Cancellation Note  Patient Details Name: Charles [name] MRN: [id number] DOB: 07/06/1970   Cancelled Treatment:    Reason Eval/Treat Not Completed: Other (comment);Patient declined, no reason specified (Patient laying prone in the bed. Upset that therapist woke him up. He refused PT evaluation despite maximal encouragement. He reports he had a cane and lost it, but plans to buy one at [facility] if needed.) PT to continue with attempts if patient willing to participate.   Donna Bernard, PT, MPT   Ina Homes 04/17/2023, 1:30 PM

## 2023-04-17 NOTE — Plan of Care (Signed)
  Problem: Education: Goal: Knowledge of General Education information will improve Description Including pain rating scale, medication(s)/side effects and non-pharmacologic comfort measures Outcome: Progressing   Problem: Health Behavior/Discharge Planning: Goal: Ability to manage health-related needs will improve Outcome: Progressing   

## 2023-04-17 NOTE — Care Management Important Message (Signed)
Important Message  Patient Details  Name: Charles [name] MRN: [id number] Date of Birth: 11/29/1970   Important Message Given:  Yes - Medicare IM     Dorena Bodo 04/17/2023, 3:15 PM

## 2023-04-17 NOTE — TOC Progression Note (Signed)
Transition of Care [facility]) - Progression Note    Patient Details  Name: Charles [name] MRN: [id number] Date of Birth: March 07, 1971  Transition of Care [facility]) CM/SW Contact  Michaela Corner, [place] Phone Number: 04/17/2023, 1:00 PM  Clinical Narrative:   CSW met pt at bedside to discuss SDOH needs. Pt expressed he was in pain and did not want to talk but will accept resource packet. CSW placed resources on pts chair in room.     Expected Discharge Plan: Homeless Shelter Barriers to Discharge: Continued Medical Work up  Expected Discharge Plan and Services In-house Referral: NA Discharge Planning Services: CM Consult Post Acute Care Choice: NA Living arrangements for the past 2 months: Homeless Shelter                 DME Arranged: N/A DME Agency: NA       HH Arranged: NA           Social Determinants of Health (SDOH) Interventions SDOH Screenings   Food Insecurity: Food Insecurity Present (04/14/2023)  Housing: High Risk (04/14/2023)  Transportation Needs: Unmet Transportation Needs (04/14/2023)  Utilities: At Risk (04/14/2023)  Alcohol Screen: Medium Risk (02/24/2023)  Financial Resource Strain: High Risk (02/24/2023)  Social Connections: Unknown (09/24/2021)   [facility], [facility]  Tobacco Use: High Risk (04/14/2023)    Readmission Risk Interventions    01/29/2023    8:43 AM 10/04/2022   11:27 AM 08/12/2022    4:45 PM  Readmission Risk Prevention Plan  Transportation Screening Complete Complete Complete  Medication Review (RN Care Manager) Complete Complete Complete  PCP or Specialist appointment within 3-5 days of discharge Complete Complete Complete  HRI or Home Care Consult Complete Complete Complete  SW Recovery Care/Counseling Consult  Complete Complete  Palliative Care Screening Not Applicable Not Applicable Not Applicable  Skilled Nursing Facility Not Applicable Not Applicable Not Applicable

## 2023-04-17 NOTE — Progress Notes (Signed)
PROGRESS NOTE  Charles [name] WUJ:[id number] DOB: 10/06/1970 DOA: 04/14/2023 PCP: Renaye Rakers, MD  HPI/Recap of past 24 hours: Charles [name] is a 52 y.o. male with medical history significant of hypertension, systolic congestive heart failure last EF 25 to 30% with grade 2 diastolic dysfunction, aplastic anemia, hepatitis, arthritis, and polysubstance abuse (cocaine, opiates, THC, tobacco) who presents with complaints of shortness of breath. Admits to using cocaine prior to coming to the hospital. VSS except for uncontrolled BP. Labs significant for creatinine 2.75, anion gap 13, and high-sensitivity troponin of 147->103. Chest x-ray revealed enlargement of the cardiopericardial silhouette with vascular congestion and probable interstitial pulmonary edema.  Patient had been given initially 3 L of normal saline IV fluids and Ativan 2 mg IV. Patient was admitted to medicine service for further workup and management of rhabdomyolysis.   Today, patient continues to report some anxiety requesting Ativan.  Denies any new complaints.  Provided a cane for ambulation.  Refuses to ambulate with PT, showering.  Reports nasal congestion   Assessment/Plan: Principal Problem:   Nausea vomiting and diarrhea Active Problems:   Elevated troponin   Acute kidney injury superimposed on chronic kidney disease (HCC)   Acute on chronic systolic congestive heart failure (HCC)   COPD (chronic obstructive pulmonary disease) (HCC)   Prolonged QT interval   Macrocytic anemia   Thrombocytopenia (HCC)   Hypertension   Chronic hepatitis C (HCC)   Cocaine abuse (HCC)     Elevated troponin High-sensitivity troponin of 147->103 EKG without significant ischemic changes Likely from demand ischemia with uncontrolled hypertension and cocaine use.    Acute kidney injury superimposed on chronic kidney disease stage 2 On admission creatinine elevated up to 2.75 with BUN 37 Baseline creatinine previously noted to be  1.3 S/p IV fluids   Rhabdomyolysis CK elevated >1,000--->211   Heart failure with reduced fraction Acute on chronic Chest x-ray concerning for enlarged cardiac silhouette with possible interstitial edema Last echocardiogram noted EF to be 25 to 30% with grade 2 diastolic dysfunction in 01/2023 BNP elevated and roughly around baseline 1,800   COPD Continue breathing treatments as needed for shortness of breath   Prolonged QT interval Chronic.  QTc noted to be 539 on admission Avoid QT prolonging medications. Correct any electrolyte abnormalities   Macrocytic anemia- stable   Thrombocytopenia- stable  Essential hypertension Continue amlodipine   Chronic hepatitis C Patient HCV quantitative 7,410,000 IU/ml 01/28/2023. Needs outpatient referral to gastroenterology   Cocaine abuse Patient had admitted to recent cocaine use.  Records note multiple positive drug screens dating back to 2018. Continue to counsel need of cessation of cocaine   Tobacco abuse Patient still reports smoking cigarettes. Continue to counsel need of cessation of tobacco abuse. Nicotine patch offered    Estimated body mass index is 24.3 kg/m as calculated from the following:   Height as of 02/21/23: 5\' 6"  (1.676 m).   Weight as of this encounter: 68.3 kg.     Code Status: Full  Family Communication: None at bedside  Disposition Plan: Status is: Inpatient Remains inpatient appropriate because: level of care      Consultants: None  Procedures: None  Antimicrobials: None  DVT prophylaxis: SCD, allergic to pork derived products   Objective: Vitals:   04/17/23 0925 04/17/23 1033 04/17/23 1146 04/17/23 1957  BP: (!) 136/91  (!) 138/92 (!) 145/88  Pulse:  96    Resp:  (!) 24 16 (!) 21  Temp:   Marland Kitchen)  97.2 F (36.2 C) (!) 97.5 F (36.4 C)  TempSrc:   Oral Oral  SpO2: 100%  96% 94%  Weight:        Intake/Output Summary (Last 24 hours) at 04/17/2023 1959 Last data filed at  04/17/2023 1700 Gross per 24 hour  Intake 360 ml  Output 600 ml  Net -240 ml   Filed Weights   04/15/23 0452 04/16/23 0032 04/17/23 0406  Weight: 70.5 kg 72.9 kg 68.3 kg    Exam: General: NAD, unkempt Cardiovascular: S1, S2 present Respiratory: CTAB Abdomen: Soft, nontender, nondistended, bowel sounds present Musculoskeletal: No bilateral pedal edema noted Skin: Normal Psychiatry: Fair mood     Data Reviewed: CBC: Recent Labs  Lab 04/14/23 0330 04/14/23 0911 04/15/23 0302 04/17/23 0236  WBC 4.7  --  7.1 4.6  NEUTROABS  --   --   --  2.9  HGB 11.8* 11.3* 11.6* 13.3  HCT 35.9* 34.1* 34.6* 39.1  MCV 104.7*  --  100.3* 100.5*  PLT 110*  --  121* 139*   Basic Metabolic Panel: Recent Labs  Lab 04/14/23 0330 04/14/23 0911 04/15/23 0302 04/16/23 1155 04/17/23 0236  NA 137  --  135 135 136  K 4.6  --  3.7 4.5 3.9  CL 106  --  105 105 106  CO2 18*  --  22 23 23   GLUCOSE 91  --  206* 79 104*  BUN 37*  --  27* 13 15  CREATININE 2.75*  --  1.54* 1.07 1.15  CALCIUM 9.0  --  8.2* 8.6* 8.6*  MG  --  1.9  --   --   --   PHOS  --  4.4  --   --   --    GFR: Estimated Creatinine Clearance: 68.6 mL/min (by C-G formula based on SCr of 1.15 mg/dL). Liver Function Tests: Recent Labs  Lab 04/14/23 0911 04/15/23 0302  AST 54* 52*  ALT 34 40  ALKPHOS 60 56  BILITOT 1.0 0.9  PROT 6.0* 6.1*  ALBUMIN 3.1* 3.0*   Recent Labs  Lab 04/14/23 0911  LIPASE 20   No results for input(s): "AMMONIA" in the last 168 hours. Coagulation Profile: No results for input(s): "INR", "PROTIME" in the last 168 hours. Cardiac Enzymes: Recent Labs  Lab 04/14/23 0911 04/16/23 1155  CKTOTAL 1,094* 211   BNP (last 3 results) No results for input(s): "PROBNP" in the last 8760 hours. HbA1C: No results for input(s): "HGBA1C" in the last 72 hours. CBG: No results for input(s): "GLUCAP" in the last 168 hours. Lipid Profile: No results for input(s): "CHOL", "HDL", "LDLCALC", "TRIG",  "CHOLHDL", "LDLDIRECT" in the last 72 hours. Thyroid Function Tests: No results for input(s): "TSH", "T4TOTAL", "FREET4", "T3FREE", "THYROIDAB" in the last 72 hours. Anemia Panel: No results for input(s): "VITAMINB12", "FOLATE", "FERRITIN", "TIBC", "IRON", "RETICCTPCT" in the last 72 hours. Urine analysis:    Component Value Date/Time   COLORURINE YELLOW 04/14/2023 1037   APPEARANCEUR CLEAR 04/14/2023 1037   LABSPEC 1.017 04/14/2023 1037   PHURINE 5.0 04/14/2023 1037   GLUCOSEU NEGATIVE 04/14/2023 1037   HGBUR NEGATIVE 04/14/2023 1037   BILIRUBINUR NEGATIVE 04/14/2023 1037   KETONESUR NEGATIVE 04/14/2023 1037   PROTEINUR 100 (A) 04/14/2023 1037   UROBILINOGEN 1.0 03/10/2015 1203   NITRITE NEGATIVE 04/14/2023 1037   LEUKOCYTESUR NEGATIVE 04/14/2023 1037   Sepsis Labs: @LABRCNTIP (procalcitonin:4,lacticidven:4)  ) Recent Results (from the past 240 hour(s))  Resp panel by RT-PCR (RSV, Flu A&B, Covid) Anterior Nasal Swab  Status: None   Collection Time: 04/14/23  5:47 PM   Specimen: Anterior Nasal Swab  Result Value Ref Range Status   SARS Coronavirus 2 by RT PCR NEGATIVE NEGATIVE Final   Influenza A by PCR NEGATIVE NEGATIVE Final   Influenza B by PCR NEGATIVE NEGATIVE Final    Comment: (NOTE) The Xpert Xpress SARS-CoV-2/FLU/RSV plus assay is intended as an aid in the diagnosis of influenza from Nasopharyngeal swab specimens and should not be used as a sole basis for treatment. Nasal washings and aspirates are unacceptable for Xpert Xpress SARS-CoV-2/FLU/RSV testing.  Fact Sheet for Patients: BloggerCourse.com  Fact Sheet for Healthcare Providers: SeriousBroker.it  This test is not yet approved or cleared by the [place] FDA and has been authorized for detection and/or diagnosis of SARS-CoV-2 by FDA under an Emergency Use Authorization (EUA). This EUA will remain in effect (meaning this test can be used) for the  duration of the COVID-19 declaration under Section 564(b)(1) of the Act, 21 U.S.C. section 360bbb-3(b)(1), unless the authorization is terminated or revoked.     Resp Syncytial Virus by PCR NEGATIVE NEGATIVE Final    Comment: (NOTE) Fact Sheet for Patients: BloggerCourse.com  Fact Sheet for Healthcare Providers: SeriousBroker.it  This test is not yet approved or cleared by the [place] FDA and has been authorized for detection and/or diagnosis of SARS-CoV-2 by FDA under an Emergency Use Authorization (EUA). This EUA will remain in effect (meaning this test can be used) for the duration of the COVID-19 declaration under Section 564(b)(1) of the Act, 21 U.S.C. section 360bbb-3(b)(1), unless the authorization is terminated or revoked.  Performed at [facility], [address], [place], [place] [address]       Studies: No results found.  Scheduled Meds:  amLODipine  10 mg Oral Daily   nicotine  21 mg Transdermal Daily    Continuous Infusions:   LOS: 3 days     Briant Cedar, MD [facility]  If 7PM-7AM, please contact night-coverage [email or web address] 04/17/2023, 7:59 PM

## 2023-04-17 NOTE — TOC Progression Note (Signed)
Transition of Care [facility]) - Progression Note    Patient Details  Name: Charles [name] MRN: [id number] Date of Birth: 07/24/1970  Transition [facility] Care [facility]) CM/SW Contact  Leone Haven, RN Phone Number: 04/17/2023, 2:31 PM  Clinical Narrative:    NCM made referral to Zack with [employer] for a single point cane for patient, he will bring this up to patient room.   Expected Discharge Plan: Homeless Shelter Barriers to Discharge: Continued Medical Work up  Expected Discharge Plan and Services In-house Referral: NA Discharge Planning Services: CM Consult Post Acute Care Choice: NA Living arrangements for the past 2 months: Homeless Shelter                 DME Arranged: N/A DME Agency: NA       HH Arranged: NA           Social Determinants of Health (SDOH) Interventions SDOH Screenings   Food Insecurity: Food Insecurity Present (04/14/2023)  Housing: High Risk (04/14/2023)  Transportation Needs: Unmet Transportation Needs (04/14/2023)  Utilities: At Risk (04/14/2023)  Alcohol Screen: Medium Risk (02/24/2023)  Financial Resource Strain: High Risk (02/24/2023)  Social Connections: Unknown (09/24/2021)   Received from [facility]  Tobacco Use: High Risk (04/14/2023)    Readmission Risk Interventions    01/29/2023    8:43 AM 10/04/2022   11:27 AM 08/12/2022    4:45 PM  Readmission Risk Prevention Plan  Transportation Screening Complete Complete Complete  Medication Review (RN Care Manager) Complete Complete Complete  PCP or Specialist appointment within 3-5 days of discharge Complete Complete Complete  HRI or Home Care Consult Complete Complete Complete  SW Recovery Care/Counseling Consult  Complete Complete  Palliative Care Screening Not Applicable Not Applicable Not Applicable  Skilled Nursing Facility Not Applicable Not Applicable Not Applicable

## 2023-04-18 ENCOUNTER — Other Ambulatory Visit (HOSPITAL_COMMUNITY): Payer: Self-pay

## 2023-04-18 ENCOUNTER — Telehealth (HOSPITAL_COMMUNITY): Payer: Self-pay

## 2023-04-18 DIAGNOSIS — R112 Nausea with vomiting, unspecified: Secondary | ICD-10-CM | POA: Diagnosis not present

## 2023-04-18 DIAGNOSIS — R197 Diarrhea, unspecified: Secondary | ICD-10-CM | POA: Diagnosis not present

## 2023-04-18 MED ORDER — FUROSEMIDE 40 MG PO TABS
40.0000 mg | ORAL_TABLET | Freq: Every day | ORAL | 0 refills | Status: DC
  Filled 2023-04-18: qty 30, 30d supply, fill #0

## 2023-04-18 MED ORDER — EMPAGLIFLOZIN 10 MG PO TABS
10.0000 mg | ORAL_TABLET | Freq: Every day | ORAL | 0 refills | Status: DC
Start: 2023-04-18 — End: 2023-05-11
  Filled 2023-04-18: qty 30, 30d supply, fill #0

## 2023-04-18 MED ORDER — LIDOCAINE 5 % EX PTCH
1.0000 | MEDICATED_PATCH | Freq: Every day | CUTANEOUS | 0 refills | Status: DC
  Filled 2023-04-18: qty 30, 30d supply, fill #0

## 2023-04-18 MED ORDER — AMLODIPINE BESYLATE 10 MG PO TABS
10.0000 mg | ORAL_TABLET | Freq: Every day | ORAL | 0 refills | Status: DC
  Filled 2023-04-18: qty 90, 90d supply, fill #0

## 2023-04-18 MED ORDER — SPIRONOLACTONE 25 MG PO TABS
12.5000 mg | ORAL_TABLET | Freq: Every day | ORAL | 0 refills | Status: DC
  Filled 2023-04-18: qty 30, 60d supply, fill #0

## 2023-04-18 MED ORDER — LOSARTAN POTASSIUM 25 MG PO TABS
25.0000 mg | ORAL_TABLET | Freq: Every day | ORAL | 0 refills | Status: DC
  Filled 2023-04-18: qty 30, 30d supply, fill #0

## 2023-04-18 MED ORDER — LIDOCAINE 5 % EX PTCH
1.0000 | MEDICATED_PATCH | Freq: Every day | CUTANEOUS | Status: DC
  Administered 2023-04-18: 1 via TRANSDERMAL
  Filled 2023-04-18: qty 1

## 2023-04-18 NOTE — Telephone Encounter (Signed)
Pharmacy Patient Advocate Encounter   Received notification prior authorization for Lidocaine 5% patches is required/requested.   Insurance verification completed.   The patient is insured through [employer] .   Prior Authorization for Lidocaine 5% patches has been APPROVED from 04-18-2023 to 05-11-2024   PA #/Case ID/Reference #: [id number]

## 2023-04-18 NOTE — Progress Notes (Signed)
PT Cancellation Note  Patient Details Name: Quay Marsack MRN: [id number] DOB: Jul 17, 1970   Cancelled Treatment:    Reason Eval/Treat Not Completed: (P) Patient declined, no reason specified. Pt declining at this time due to wanting to get another hour or 2 of sleep. Pt agreeable to PT returning then and completing PT Eval. Will plan to follow-up in an hour or 2 per pt request.   Virgil Benedict, PT, DPT Acute Rehabilitation Services  Office: [phone]    Bettina Gavia 04/18/2023, 8:47 AM

## 2023-04-18 NOTE — Discharge Summary (Signed)
Physician Discharge Summary   Patient: Charles [name] MRN: [id number] DOB: [birth year]  Admit date:     04/14/2023  Discharge date: 04/18/23  Discharge Physician: Briant Cedar   PCP: Renaye Rakers, MD   Recommendations at discharge:   Follow-up with PCP in 1 week Follow-up with cardiology as scheduled  Discharge Diagnoses: Principal Problem:   Nausea vomiting and diarrhea Active Problems:   Elevated troponin   Acute kidney injury superimposed on chronic kidney disease (HCC)   Acute on chronic systolic congestive heart failure (HCC)   COPD (chronic obstructive pulmonary disease) (HCC)   Prolonged QT interval   Macrocytic anemia   Thrombocytopenia (HCC)   Hypertension   Chronic hepatitis C (HCC)   Cocaine abuse [facility])    Hospital Course: Charles [name] is a 52 y.o. male with medical history significant of hypertension, systolic congestive heart failure last EF 25 to 30% with grade 2 diastolic dysfunction, aplastic anemia, hepatitis, arthritis, and polysubstance abuse (cocaine, opiates, THC, tobacco) who presents with complaints of shortness of breath. Admits to using cocaine prior to coming to the hospital. VSS except for uncontrolled BP. Labs significant for creatinine 2.75, anion gap 13, and high-sensitivity troponin of 147->103. Chest x-ray revealed enlargement of the cardiopericardial silhouette with vascular congestion and probable interstitial pulmonary edema.  Patient had been given initially 3 L of normal saline IV fluids and Ativan 2 mg IV. Patient was admitted to medicine service for further workup and management of rhabdomyolysis.     Today, patient denies any new complaints.  Denies any chest pain, abdominal pain, nausea/vomiting, fever/chills, shortness of breath.  Patient very eager to be discharged, because [birth year] his birthday.  Noted to have a short run of atrial flutter, which spontaneously returned to normal sinus rhythm.  Of note, patient states that he will  use cocaine, marijuana abstinence is discharged.  High risk for readmission as patient is homeless as well    Assessment and Plan:  Elevated troponin Currently chest pain-free High-sensitivity troponin of 147->103 EKG without significant ischemic changes Likely from demand ischemia with uncontrolled hypertension and cocaine use.    Acute kidney injury superimposed on chronic kidney disease stage 2 Resolved On admission creatinine elevated up to 2.75 with BUN 37 Baseline creatinine previously noted to be 1.3 S/p IV fluids   Rhabdomyolysis CK elevated >1,000--->211   Chronic heart failure with reduced fraction Chest x-ray concerning for enlarged cardiac silhouette with possible interstitial edema Last echocardiogram noted EF to be 25 to 30% with grade 2 diastolic dysfunction in 01/2023 BNP elevated and roughly around baseline 1,800   COPD Stable   Prolonged QT interval Chronic.  QTc noted to be 539 on admission, repeat EKG with improved QTc Avoid QT prolonging medications   Macrocytic anemia- stable   Thrombocytopenia- stable   Essential hypertension Continue amlodipine, losartan, aldactone   Chronic hepatitis C Patient HCV quantitative 7,410,000 IU/ml 01/28/2023. Needs outpatient referral to gastroenterology   Polysubstance abuse-THC/opiates Cocaine abuse Patient had admitted to recent cocaine use Records note multiple positive drug screens dating back to 2018. Continue to counsel need of cessation of cocaine   Tobacco abuse Patient still reports smoking cigarettes. Continue to counsel need of cessation of tobacco abuse. Nicotine patch offered      Consultants: None Procedures performed: None Disposition: Home Diet recommendation:  Cardiac diet   DISCHARGE MEDICATION: Allergies as of 04/18/2023       Reactions   Egg-derived Products Nausea And Vomiting  Banana Nausea And Vomiting   Pork-derived Products Nausea And Vomiting        Medication  List     TAKE these medications    albuterol 108 (90 Base) MCG/ACT inhaler Commonly known as: VENTOLIN HFA Inhale 2 puffs into the lungs every 6 (six) hours as needed for wheezing or shortness of breath.   amLODipine 10 MG tablet Commonly known as: NORVASC Take 1 tablet (10 mg total) by mouth daily. Start taking on: April 19, 2023   cyanocobalamin 1000 MCG tablet Commonly known as: VITAMIN B12 Take 1 tablet (1,000 mcg total) by mouth daily.   empagliflozin 10 MG Tabs tablet Commonly known as: Jardiance Take 1 tablet (10 mg total) by mouth daily before breakfast.   furosemide 40 MG tablet Commonly known as: LASIX Take 1 tablet (40 mg total) by mouth daily.   lidocaine 5 % Commonly known as: LIDODERM Place 1 patch onto the skin daily. Remove & Discard patch within 12 hours or as directed by MD Start taking on: April 19, 2023   losartan 25 MG tablet Commonly known as: COZAAR Take 1 tablet (25 mg total) by mouth daily.   spironolactone 25 MG tablet Commonly known as: ALDACTONE Take 0.5 tablets (12.5 mg total) by mouth daily.               Durable Medical Equipment  (From admission, onward)           Start     Ordered   04/17/23 1458  For home use only DME Cane  Once        04/17/23 1457            Follow-up Information     Renaye Rakers, MD Follow up.   Specialty: Family Medicine Contact information: [address] [place], [address] [place] [address] [phone]                Discharge Exam: Ceasar Mons Weights   04/16/23 0032 04/17/23 0406 04/18/23 0241  Weight: 72.9 kg 68.3 kg 69.6 kg   General: NAD  Cardiovascular: S1, S2 present Respiratory: CTAB Abdomen: Soft, nontender, nondistended, bowel sounds present Musculoskeletal: No bilateral pedal edema noted Skin: Normal Psychiatry: Normal mood   Condition at discharge: stable  The results of significant diagnostics from this hospitalization (including imaging, microbiology, ancillary and  laboratory) are listed below for reference.   Imaging Studies: DG Chest Port 1 View  Result Date: 04/14/2023 CLINICAL DATA:  Vomiting. EXAM: PORTABLE CHEST 1 VIEW COMPARISON:  02/21/2023 FINDINGS: The cardio pericardial silhouette is enlarged. Vascular congestion noted with probable interstitial pulmonary edema. No focal consolidation. No pneumothorax or pleural effusion. No acute bony abnormality. Telemetry leads overlie the chest. IMPRESSION: Enlargement of the cardiopericardial silhouette with vascular congestion and probable interstitial pulmonary edema. Electronically Signed   By: Kennith Center M.D.   On: 04/14/2023 04:20    Microbiology: Results for orders placed or performed during the hospital encounter of 04/14/23  Resp panel by RT-PCR (RSV, Flu A&B, Covid) Anterior Nasal Swab     Status: None   Collection Time: 04/14/23  5:47 PM   Specimen: Anterior Nasal Swab  Result Value Ref Range Status   SARS Coronavirus 2 by RT PCR NEGATIVE NEGATIVE Final   Influenza A by PCR NEGATIVE NEGATIVE Final   Influenza B by PCR NEGATIVE NEGATIVE Final    Comment: (NOTE) The Xpert Xpress SARS-CoV-2/FLU/RSV plus assay is intended as an aid in the diagnosis of influenza from Nasopharyngeal swab specimens and  should not be used as a sole basis for treatment. Nasal washings and aspirates are unacceptable for Xpert Xpress SARS-CoV-2/FLU/RSV testing.  Fact Sheet for Patients: BloggerCourse.com  Fact Sheet for Healthcare Providers: SeriousBroker.it  This test is not yet approved or cleared by the [place] FDA and has been authorized for detection and/or diagnosis of SARS-CoV-2 by FDA under an Emergency Use Authorization (EUA). This EUA will remain in effect (meaning this test can be used) for the duration of the COVID-19 declaration under Section 564(b)(1) of the Act, 21 U.S.C. section 360bbb-3(b)(1), unless the authorization is terminated  or revoked.     Resp Syncytial Virus by PCR NEGATIVE NEGATIVE Final    Comment: (NOTE) Fact Sheet for Patients: BloggerCourse.com  Fact Sheet for Healthcare Providers: SeriousBroker.it  This test is not yet approved or cleared by the [place] FDA and has been authorized for detection and/or diagnosis of SARS-CoV-2 by FDA under an Emergency Use Authorization (EUA). This EUA will remain in effect (meaning this test can be used) for the duration of the COVID-19 declaration under Section 564(b)(1) of the Act, 21 U.S.C. section 360bbb-3(b)(1), unless the authorization is terminated or revoked.  Performed at [facility], [address], [place], [place] [address]     Labs: CBC: Recent Labs  Lab 04/14/23 0330 04/14/23 0911 04/15/23 0302 04/17/23 0236  WBC 4.7  --  7.1 4.6  NEUTROABS  --   --   --  2.9  HGB 11.8* 11.3* 11.6* 13.3  HCT 35.9* 34.1* 34.6* 39.1  MCV 104.7*  --  100.3* 100.5*  PLT 110*  --  121* 139*   Basic Metabolic Panel: Recent Labs  Lab 04/14/23 0330 04/14/23 0911 04/15/23 0302 04/16/23 1155 04/17/23 0236  NA 137  --  135 135 136  K 4.6  --  3.7 4.5 3.9  CL 106  --  105 105 106  CO2 18*  --  22 23 23   GLUCOSE 91  --  206* 79 104*  BUN 37*  --  27* 13 15  CREATININE 2.75*  --  1.54* 1.07 1.15  CALCIUM 9.0  --  8.2* 8.6* 8.6*  MG  --  1.9  --   --   --   PHOS  --  4.4  --   --   --    Liver Function Tests: Recent Labs  Lab 04/14/23 0911 04/15/23 0302  AST 54* 52*  ALT 34 40  ALKPHOS 60 56  BILITOT 1.0 0.9  PROT 6.0* 6.1*  ALBUMIN 3.1* 3.0*   CBG: No results for input(s): "GLUCAP" in the last 168 hours.  Discharge time spent: greater than 30 minutes.  Signed: Briant Cedar, MD [facility] 04/18/2023
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
## 2023-04-18 NOTE — Plan of Care (Signed)
  Problem: Education: Goal: Knowledge of General Education information will improve Description Including pain rating scale, medication(s)/side effects and non-pharmacologic comfort measures Outcome: Progressing   Problem: Health Behavior/Discharge Planning: Goal: Ability to manage health-related needs will improve Outcome: Progressing   

## 2023-04-18 NOTE — Plan of Care (Signed)
  Problem: Clinical Measurements: Goal: Will remain free from infection Outcome: Progressing   Problem: Coping: Goal: Level of anxiety will decrease Outcome: Progressing   

## 2023-04-18 NOTE — TOC Transition Note (Addendum)
Transition of Care [facility]) - CM/SW Discharge Note   Patient Details  Name: Charles [name] MRN: [id number] Date of Birth: 01/03/1971  [facility] CM/SW Contact:  Leone Haven, RN Phone Number: 04/18/2023, 3:48 PM   Clinical Narrative:    For dc today. May need bus pass, NCM assisted with bus pass.   Final next level of care: Homeless Shelter Barriers to Discharge: Continued Medical Work up   Patient Goals and CMS Choice   Choice offered to / list presented to : NA  Discharge Placement                         Discharge Plan and Services Additional resources added to the After Visit Summary for   In-house Referral: NA Discharge Planning Services: CM Consult Post Acute Care Choice: NA          DME Arranged: N/A DME Agency: NA       HH Arranged: NA          Social Determinants of Health (SDOH) Interventions SDOH Screenings   Food Insecurity: Food Insecurity Present (04/14/2023)  Housing: High Risk (04/14/2023)  Transportation Needs: Unmet Transportation Needs (04/14/2023)  Utilities: At Risk (04/14/2023)  Alcohol Screen: Medium Risk (02/24/2023)  Financial Resource Strain: High Risk (02/24/2023)  Social Connections: Unknown (09/24/2021)   Received from [facility]  Tobacco Use: High Risk (04/14/2023)     Readmission Risk Interventions    01/29/2023    8:43 AM 10/04/2022   11:27 AM 08/12/2022    4:45 PM  Readmission Risk Prevention Plan  Transportation Screening Complete Complete Complete  Medication Review (RN Care Manager) Complete Complete Complete  PCP or Specialist appointment within 3-5 days of discharge Complete Complete Complete  HRI or Home Care Consult Complete Complete Complete  SW Recovery Care/Counseling Consult  Complete Complete  Palliative Care Screening Not Applicable Not Applicable Not Applicable  Skilled Nursing Facility Not Applicable Not Applicable Not Applicable

## 2023-04-18 NOTE — Plan of Care (Signed)
Patient is discharged.

## 2023-04-23 ENCOUNTER — Emergency Department (HOSPITAL_COMMUNITY): Payer: Medicare HMO

## 2023-04-23 ENCOUNTER — Encounter (HOSPITAL_COMMUNITY): Payer: Self-pay | Admitting: Emergency Medicine

## 2023-04-23 ENCOUNTER — Other Ambulatory Visit: Payer: Self-pay

## 2023-04-23 ENCOUNTER — Emergency Department (HOSPITAL_COMMUNITY)
Admission: EM | Admit: 2023-04-23 | Discharge: 2023-04-24 | Disposition: A | Discharge status: Home / Self Care | Payer: Medicare HMO | Attending: Emergency Medicine | Admitting: Emergency Medicine

## 2023-04-23 DIAGNOSIS — Z1152 Encounter for screening for COVID-19: Secondary | ICD-10-CM | POA: Diagnosis not present

## 2023-04-23 DIAGNOSIS — J101 Influenza due to other identified influenza virus with other respiratory manifestations: Secondary | ICD-10-CM | POA: Insufficient documentation

## 2023-04-23 DIAGNOSIS — J111 Influenza due to unidentified influenza virus with other respiratory manifestations: Secondary | ICD-10-CM

## 2023-04-23 DIAGNOSIS — K4090 Unilateral inguinal hernia, without obstruction or gangrene, not specified as recurrent: Secondary | ICD-10-CM | POA: Diagnosis not present

## 2023-04-23 DIAGNOSIS — F17200 Nicotine dependence, unspecified, uncomplicated: Secondary | ICD-10-CM | POA: Diagnosis not present

## 2023-04-23 DIAGNOSIS — Z79899 Other long term (current) drug therapy: Secondary | ICD-10-CM | POA: Diagnosis not present

## 2023-04-23 DIAGNOSIS — I10 Essential (primary) hypertension: Secondary | ICD-10-CM | POA: Insufficient documentation

## 2023-04-23 DIAGNOSIS — R0602 Shortness of breath: Secondary | ICD-10-CM | POA: Diagnosis not present

## 2023-04-23 DIAGNOSIS — R079 Chest pain, unspecified: Secondary | ICD-10-CM | POA: Diagnosis not present

## 2023-04-23 DIAGNOSIS — N179 Acute kidney failure, unspecified: Secondary | ICD-10-CM | POA: Diagnosis not present

## 2023-04-23 DIAGNOSIS — I771 Stricture of artery: Secondary | ICD-10-CM | POA: Diagnosis not present
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
## 2023-04-23 NOTE — ED Provider Triage Note (Signed)
Emergency Medicine Provider Triage Evaluation Note  Charles [name] , a 52 y.o. male  was evaluated in triage.  Pt complains of sob. Pt sts he last use crack 2-3 days ago and now having withdrawal.  Called out for SOB.  Doesn't want to talk much  Review of Systems  Positive: As above Negative: As above  Physical Exam  There were no vitals taken for this visit. Gen:   Awake, no distress   Resp:  Normal effort  MSK:   Moves extremities without difficulty  Other:    Medical Decision Making  Medically screening exam initiated at 5:35 PM.  Appropriate orders placed.  Cecile Butorac was informed that the remainder of the evaluation will be completed by another provider, this initial triage assessment does not replace that evaluation, and the importance of remaining in the ED until their evaluation is complete.     Fayrene Helper, PA-C 04/23/23 1735

## 2023-04-23 NOTE — ED Notes (Signed)
Pt declines EKG and blood work Given ice chips per request

## 2023-04-24 ENCOUNTER — Encounter (HOSPITAL_COMMUNITY): Payer: Self-pay | Admitting: Emergency Medicine

## 2023-04-24 ENCOUNTER — Emergency Department (HOSPITAL_COMMUNITY)
Admission: EM | Admit: 2023-04-24 | Discharge: 2023-04-24 | Disposition: A | Discharge status: Home / Self Care | Payer: Medicare HMO | Source: Home / Self Care | Attending: Emergency Medicine | Admitting: Emergency Medicine

## 2023-04-24 DIAGNOSIS — N189 Chronic kidney disease, unspecified: Secondary | ICD-10-CM | POA: Insufficient documentation

## 2023-04-24 DIAGNOSIS — Z79899 Other long term (current) drug therapy: Secondary | ICD-10-CM | POA: Insufficient documentation

## 2023-04-24 DIAGNOSIS — J449 Chronic obstructive pulmonary disease, unspecified: Secondary | ICD-10-CM | POA: Insufficient documentation

## 2023-04-24 DIAGNOSIS — K4090 Unilateral inguinal hernia, without obstruction or gangrene, not specified as recurrent: Secondary | ICD-10-CM | POA: Insufficient documentation

## 2023-04-24 DIAGNOSIS — I509 Heart failure, unspecified: Secondary | ICD-10-CM | POA: Insufficient documentation

## 2023-04-24 DIAGNOSIS — J101 Influenza due to other identified influenza virus with other respiratory manifestations: Secondary | ICD-10-CM | POA: Diagnosis not present

## 2023-04-24 DIAGNOSIS — I130 Hypertensive heart and chronic kidney disease with heart failure and stage 1 through stage 4 chronic kidney disease, or unspecified chronic kidney disease: Secondary | ICD-10-CM | POA: Insufficient documentation

## 2023-04-24 LAB — RESP PANEL BY RT-PCR (RSV, FLU A&B, COVID)  RVPGX2
Influenza A by PCR: POSITIVE — AB
Influenza B by PCR: NEGATIVE
Resp Syncytial Virus by PCR: NEGATIVE
SARS Coronavirus 2 by RT PCR: NEGATIVE

## 2023-04-24 LAB — CBC
HCT: 39 % (ref 39.0–52.0)
HCT: 40.9 % (ref 39.0–52.0)
Hemoglobin: 13.1 g/dL (ref 13.0–17.0)
Hemoglobin: 13.6 g/dL (ref 13.0–17.0)
MCH: 34.1 pg — ABNORMAL HIGH (ref 26.0–34.0)
MCH: 34 pg (ref 26.0–34.0)
MCHC: 33.6 g/dL (ref 30.0–36.0)
MCHC: 33.3 g/dL (ref 30.0–36.0)
MCV: 101.6 fL — ABNORMAL HIGH (ref 80.0–100.0)
MCV: 102.3 fL — ABNORMAL HIGH (ref 80.0–100.0)
Platelets: 137 10*3/uL — ABNORMAL LOW (ref 150–400)
Platelets: 126 10*3/uL — ABNORMAL LOW (ref 150–400)
RBC: 3.84 MIL/uL — ABNORMAL LOW (ref 4.22–5.81)
RBC: 4 MIL/uL — ABNORMAL LOW (ref 4.22–5.81)
RDW: 17.4 % — ABNORMAL HIGH (ref 11.5–15.5)
RDW: 17.6 % — ABNORMAL HIGH (ref 11.5–15.5)
WBC: 5.5 10*3/uL (ref 4.0–10.5)
WBC: 5 10*3/uL (ref 4.0–10.5)
nRBC: 0 % (ref 0.0–0.2)
nRBC: 0 % (ref 0.0–0.2)

## 2023-04-24 LAB — BASIC METABOLIC PANEL
Anion gap: 7 (ref 5–15)
BUN: 37 mg/dL — ABNORMAL HIGH (ref 6–20)
CO2: 20 mmol/L — ABNORMAL LOW (ref 22–32)
Calcium: 8.1 mg/dL — ABNORMAL LOW (ref 8.9–10.3)
Chloride: 104 mmol/L (ref 98–111)
Creatinine, Ser: 1.73 mg/dL — ABNORMAL HIGH (ref 0.61–1.24)
GFR, Estimated: 47 mL/min — ABNORMAL LOW (ref >60)
Glucose, Bld: 105 mg/dL — ABNORMAL HIGH (ref 70–99)
Potassium: 3.8 mmol/L (ref 3.5–5.1)
Sodium: 131 mmol/L — ABNORMAL LOW (ref 135–145)

## 2023-04-24 LAB — COMPREHENSIVE METABOLIC PANEL
ALT: 34 U/L (ref 0–44)
AST: 36 U/L (ref 15–41)
Albumin: 2.8 g/dL — ABNORMAL LOW (ref 3.5–5.0)
Alkaline Phosphatase: 51 U/L (ref 38–126)
Anion gap: 9 (ref 5–15)
BUN: 31 mg/dL — ABNORMAL HIGH (ref 6–20)
CO2: 19 mmol/L — ABNORMAL LOW (ref 22–32)
Calcium: 8.3 mg/dL — ABNORMAL LOW (ref 8.9–10.3)
Chloride: 104 mmol/L (ref 98–111)
Creatinine, Ser: 1.79 mg/dL — ABNORMAL HIGH (ref 0.61–1.24)
GFR, Estimated: 45 mL/min — ABNORMAL LOW (ref >60)
Glucose, Bld: 123 mg/dL — ABNORMAL HIGH (ref 70–99)
Potassium: 3.9 mmol/L (ref 3.5–5.1)
Sodium: 132 mmol/L — ABNORMAL LOW (ref 135–145)
Total Bilirubin: 0.7 mg/dL (ref <1.2)
Total Protein: 6.4 g/dL — ABNORMAL LOW (ref 6.5–8.1)

## 2023-04-24 LAB — URINALYSIS, ROUTINE W REFLEX MICROSCOPIC
Bacteria, UA: NONE SEEN
Bilirubin Urine: NEGATIVE
Glucose, UA: 150 mg/dL — AB
Ketones, ur: NEGATIVE mg/dL
Leukocytes,Ua: NEGATIVE
Nitrite: NEGATIVE
Protein, ur: 30 mg/dL — AB
Specific Gravity, Urine: 1.017 (ref 1.005–1.030)
pH: 6 (ref 5.0–8.0)

## 2023-04-24 LAB — LIPASE, BLOOD: Lipase: 49 U/L (ref 11–51)

## 2023-04-24 LAB — TROPONIN I (HIGH SENSITIVITY)
Troponin I (High Sensitivity): 78 ng/L — ABNORMAL HIGH (ref <18)
Troponin I (High Sensitivity): 75 ng/L — ABNORMAL HIGH (ref <18)

## 2023-04-24 MED ORDER — ALBUTEROL SULFATE HFA 108 (90 BASE) MCG/ACT IN AERS
2.0000 | INHALATION_SPRAY | RESPIRATORY_TRACT | 0 refills | Status: DC | PRN
Start: 2023-04-24 — End: 2023-05-11

## 2023-04-24 MED ORDER — ACETAMINOPHEN 500 MG PO TABS
1000.0000 mg | ORAL_TABLET | Freq: Once | ORAL | Status: AC
Start: 2023-04-24 — End: 2023-04-24
  Administered 2023-04-24: 1000 mg via ORAL
  Filled 2023-04-24: qty 2

## 2023-04-24 MED ORDER — ALBUTEROL SULFATE HFA 108 (90 BASE) MCG/ACT IN AERS
2.0000 | INHALATION_SPRAY | Freq: Once | RESPIRATORY_TRACT | Status: AC
  Administered 2023-04-24: 2 via RESPIRATORY_TRACT
  Filled 2023-04-24: qty 6.7

## 2023-04-24 NOTE — ED Provider Notes (Signed)
[facility] AT [facility] Provider Note   CSN: [id number] Arrival date & time: 04/24/23  4098     History  No chief complaint on file.   Charles [name] is a 52 y.o. male with a PMH of COPD, hepatitis C, CKD, CHF, HTN, polysubstance use who presents to the ED for hernia pain.  Patient reports he was seen in the ED last night and diagnosed with the flu.  Reports he did not mention his hernia to them because he was not worried about it at the time, although he does report that he has had a 1 week of pain and diarrhea.  States he went to the waiting room to sit there for a few minutes prior to checking back in.  He denies blood in his diarrhea.  Denies fevers, abdominal pain, chest pain, shortness of breath.  He is currently endorsing full body pain and congestion.  He reports no one will look at his hernia, so he would like for her to be evaluated today.  He states it enlarges when he stands up and gets smaller when he lays down.  Reports he has a history of a right-sided inguinal hernia that was repaired while he was in prison several years ago.  Also endorses crack cocaine use that makes his hernia pain worse, but reports he last used 4 days ago.    HPI     Home Medications Prior to Admission medications   Medication Sig Start Date End Date Taking? Authorizing Provider  albuterol (VENTOLIN HFA) 108 (90 Base) MCG/ACT inhaler Inhale 2 puffs into the lungs every 6 (six) hours as needed for wheezing or shortness of breath. 02/25/23   Zannie Cove, MD  albuterol (VENTOLIN HFA) 108 (90 Base) MCG/ACT inhaler Inhale 2 puffs into the lungs every 4 (four) hours as needed for wheezing or shortness of breath. 04/24/23   Horton, Mayer Masker, MD  amLODipine (NORVASC) 10 MG tablet Take 1 tablet (10 mg total) by mouth daily. 04/19/23 07/18/23  Briant Cedar, MD  cyanocobalamin (VITAMIN B12) 1000 MCG tablet Take 1 tablet (1,000 mcg total) by mouth daily. 02/25/23    Zannie Cove, MD  empagliflozin (JARDIANCE) 10 MG TABS tablet Take 1 tablet (10 mg total) by mouth daily before breakfast. 04/18/23   Briant Cedar, MD  furosemide (LASIX) 40 MG tablet Take 1 tablet (40 mg total) by mouth daily. 04/18/23   Briant Cedar, MD  lidocaine (LIDODERM) 5 % Place 1 patch onto the skin daily. Remove & Discard patch within 12 hours or as directed by MD 04/19/23   Briant Cedar, MD  losartan (COZAAR) 25 MG tablet Take 1 tablet (25 mg total) by mouth daily. 04/18/23 05/18/23  Briant Cedar, MD  spironolactone (ALDACTONE) 25 MG tablet Take 0.5 tablets (12.5 mg total) by mouth daily. 04/18/23   Briant Cedar, MD      Allergies    Egg-derived products, Banana, and Pork-derived products    Review of Systems   Review of Systems  Physical Exam Updated Vital Signs BP (!) 144/99 (BP Location: Right Arm)   Pulse 90   Temp 98.8 F (37.1 C) (Oral)   Resp (!) 22   Ht 5\' 6"  (1.676 m)   Wt 71.2 kg   SpO2 93%   BMI 25.34 kg/m  Physical Exam Constitutional:      Appearance: Normal appearance.  HENT:     Head: Normocephalic and atraumatic.  Nose: Congestion present.     Mouth/Throat:     Mouth: Mucous membranes are moist.     Pharynx: Oropharynx is clear.  Eyes:     Pupils: Pupils are equal, round, and reactive to light.  Cardiovascular:     Rate and Rhythm: Normal rate and regular rhythm.     Heart sounds: Normal heart sounds. No murmur heard.    No friction rub. No gallop.  Pulmonary:     Breath sounds: Normal breath sounds. No stridor. No wheezing, rhonchi or rales.  Abdominal:     Palpations: Abdomen is soft.     Tenderness: There is no abdominal tenderness. There is no guarding or rebound.     Comments: Left-sided inguinal hernia, soft and fully reducible with mild tenderness to palpation  Musculoskeletal:     Right lower leg: No edema.     Left lower leg: No edema.  Skin:    General: Skin is warm and dry.     Capillary  Refill: Capillary refill takes less than 2 seconds.  Neurological:     General: No focal deficit present.     Mental Status: He is alert.     ED Results / Procedures / Treatments   Labs (all labs ordered are listed, but only abnormal results are displayed) Labs Reviewed  COMPREHENSIVE METABOLIC PANEL - Abnormal; Notable for the following components:      Result Value   Sodium 132 (*)    CO2 19 (*)    Glucose, Bld 123 (*)    BUN 31 (*)    Creatinine, Ser 1.79 (*)    Calcium 8.3 (*)    Total Protein 6.4 (*)    Albumin 2.8 (*)    GFR, Estimated 45 (*)    All other components within normal limits  CBC - Abnormal; Notable for the following components:   RBC 4.00 (*)    MCV 102.3 (*)    RDW 17.6 (*)    Platelets 126 (*)    All other components within normal limits  URINALYSIS, ROUTINE W REFLEX MICROSCOPIC - Abnormal; Notable for the following components:   Glucose, UA 150 (*)    Hgb urine dipstick SMALL (*)    Protein, ur 30 (*)    All other components within normal limits  LIPASE, BLOOD    EKG None  Radiology DG Chest 2 View Result Date: 04/23/2023 CLINICAL DATA:  Chest pain. EXAM: CHEST - 2 VIEW COMPARISON:  X-ray 04/14/2023 and older. FINDINGS: No consolidation, pneumothorax or effusion. Borderline cardiopericardial silhouette without edema. Slightly tortuous aorta. Presumed embolization material left upper quadrant of the abdomen. Moderate degenerative changes of the spine with large anterior osteophytes IMPRESSION: Borderline size heart.  Actually decreased in size from previous Electronically Signed   By: Karen Kays M.D.   On: 04/23/2023 19:26    Procedures Procedures    Medications Ordered in ED Medications  acetaminophen (TYLENOL) tablet 1,000 mg (1,000 mg Oral Given 04/24/23 1023)    ED Course/ Medical Decision Making/ A&P                                 Medical Decision Making Amount and/or Complexity of Data Reviewed Labs: ordered.  Risk OTC  drugs.   Vital signs stable, physical exam with a left inguinal hernia that is fully reducible with mild tenderness to palpation.  CBC with no leukocytosis or anemia.  CMP with creatinine 1.79,  around prior from last night.  Patient presents to the ED with his oral rehydration solution provided to him at his last ED visit.  He reports he will continue to drink this for rehydration.  We had an extensive discussion related to the patient's hernia and that it is stable for outpatient follow-up if he would like to have this fixed.  Additionally, we discussed what else I can do for the patient to make sure all of his needs are addressed at this visit, he reported he would like a snack and some Tylenol, but denies other concerns today.  We discussed indications to return to the emergency department department related to his hernia including inability to reduce the hernia, he voiced understanding.  Encouraged him to follow-up with his PCP and provided him information to schedule an appointment with a new 1.  Patient was discharged in stable condition.        Final Clinical Impression(s) / ED Diagnoses Final diagnoses:  Left inguinal hernia    Rx / DC Orders ED Discharge Orders     None         Janyth Pupa, MD 04/24/23 1134    Gwyneth Sprout, MD 04/24/23 2056

## 2023-04-24 NOTE — ED Notes (Signed)
Oral rehydration provided to patient

## 2023-04-24 NOTE — ED Notes (Addendum)
Patient agitated while walking into room cussing and stating he wants a sandwich bag. Patient does not want to be hooked up to monitor.

## 2023-04-24 NOTE — ED Provider Notes (Signed)
[place] EMERGENCY DEPARTMENT AT [facility] Provider Note   CSN: [id number] Arrival date & time: 04/23/23  1707     History  Chief Complaint  Patient presents with   Shortness of Breath    Charles [name] is a 52 y.o. male.  HPI     This is a 52 year old male who presents with multiple complaints.  Patient states he has had shortness of breath and cough since being discharged from the hospital.  He was recently admitted for nausea, vomiting, AKI, and elevated troponin.  Denies fevers.  Reports shortness of breath in the setting of ongoing cocaine use.  He also is a smoker.  Home Medications Prior to Admission medications   Medication Sig Start Date End Date Taking? Authorizing Provider  albuterol (VENTOLIN HFA) 108 (90 Base) MCG/ACT inhaler Inhale 2 puffs into the lungs every 6 (six) hours as needed for wheezing or shortness of breath. 02/25/23   Zannie Cove, MD  amLODipine (NORVASC) 10 MG tablet Take 1 tablet (10 mg total) by mouth daily. 04/19/23 07/18/23  Briant Cedar, MD  cyanocobalamin (VITAMIN B12) 1000 MCG tablet Take 1 tablet (1,000 mcg total) by mouth daily. 02/25/23   Zannie Cove, MD  empagliflozin (JARDIANCE) 10 MG TABS tablet Take 1 tablet (10 mg total) by mouth daily before breakfast. 04/18/23   Briant Cedar, MD  furosemide (LASIX) 40 MG tablet Take 1 tablet (40 mg total) by mouth daily. 04/18/23   Briant Cedar, MD  lidocaine (LIDODERM) 5 % Place 1 patch onto the skin daily. Remove & Discard patch within 12 hours or as directed by MD 04/19/23   Briant Cedar, MD  losartan (COZAAR) 25 MG tablet Take 1 tablet (25 mg total) by mouth daily. 04/18/23 05/18/23  Briant Cedar, MD  spironolactone (ALDACTONE) 25 MG tablet Take 0.5 tablets (12.5 mg total) by mouth daily. 04/18/23   Briant Cedar, MD      Allergies    Egg-derived products, Banana, and Pork-derived products    Review of Systems   Review of Systems   Constitutional:  Negative for fever.  Respiratory:  Positive for cough and shortness of breath.   Cardiovascular:  Negative for chest pain.  Gastrointestinal:  Negative for abdominal pain, nausea and vomiting.  All other systems reviewed and are negative.   Physical Exam Updated Vital Signs BP (!) 146/107   Pulse 83   Temp 98.8 F (37.1 C)   Resp 17   SpO2 98%  Physical Exam Vitals and nursing note reviewed.  Constitutional:      Appearance: He is well-developed. He is not ill-appearing.  HENT:     Head: Normocephalic and atraumatic.  Eyes:     Pupils: Pupils are equal, round, and reactive to light.  Cardiovascular:     Rate and Rhythm: Normal rate and regular rhythm.     Heart sounds: Normal heart sounds. No murmur heard. Pulmonary:     Effort: Pulmonary effort is normal. No respiratory distress.     Breath sounds: Wheezing present.  Abdominal:     General: Bowel sounds are normal.     Palpations: Abdomen is soft.     Tenderness: There is no abdominal tenderness. There is no rebound.  Musculoskeletal:     Cervical back: Neck supple.     Right lower leg: No edema.     Left lower leg: No edema.  Lymphadenopathy:     Cervical: No cervical adenopathy.  Skin:  General: Skin is warm and dry.  Neurological:     Mental Status: He is alert and oriented to person, place, and time.  Psychiatric:     Comments: Labile     ED Results / Procedures / Treatments   Labs (all labs ordered are listed, but only abnormal results are displayed) Labs Reviewed  RESP PANEL BY RT-PCR (RSV, FLU A&B, COVID)  RVPGX2 - Abnormal; Notable for the following components:      Result Value   Influenza A by PCR POSITIVE (*)    All other components within normal limits  BASIC METABOLIC PANEL - Abnormal; Notable for the following components:   Sodium 131 (*)    CO2 20 (*)    Glucose, Bld 105 (*)    BUN 37 (*)    Creatinine, Ser 1.73 (*)    Calcium 8.1 (*)    GFR, Estimated 47 (*)    All  other components within normal limits  CBC - Abnormal; Notable for the following components:   RBC 3.84 (*)    MCV 101.6 (*)    MCH 34.1 (*)    RDW 17.4 (*)    Platelets 137 (*)    All other components within normal limits  TROPONIN I (HIGH SENSITIVITY) - Abnormal; Notable for the following components:   Troponin I (High Sensitivity) 78 (*)    All other components within normal limits  TROPONIN I (HIGH SENSITIVITY)    EKG EKG Interpretation Date/Time:  Thursday April 24 2023 04:39:37 EST Ventricular Rate:  85 PR Interval:  159 QRS Duration:  163 QT Interval:  393 QTC Calculation: 468 R Axis:   73  Text Interpretation: Sinus rhythm Atrial premature complex Probable left atrial enlargement LVH with IVCD and secondary repol abnrm Confirmed by Ross Marcus ([id number]) on 04/24/2023 4:47:25 AM  Radiology DG Chest 2 View Result Date: 04/23/2023 CLINICAL DATA:  Chest pain. EXAM: CHEST - 2 VIEW COMPARISON:  X-ray 04/14/2023 and older. FINDINGS: No consolidation, pneumothorax or effusion. Borderline cardiopericardial silhouette without edema. Slightly tortuous aorta. Presumed embolization material left upper quadrant of the abdomen. Moderate degenerative changes of the spine with large anterior osteophytes IMPRESSION: Borderline size heart.  Actually decreased in size from previous Electronically Signed   By: Karen Kays M.D.   On: 04/23/2023 19:26    Procedures Procedures    Medications Ordered in ED Medications  albuterol (VENTOLIN HFA) 108 (90 Base) MCG/ACT inhaler 2 puff (2 puffs Inhalation Given 04/24/23 3244)    ED Course/ Medical Decision Making/ A&P                                 Medical Decision Making Amount and/or Complexity of Data Reviewed Labs: ordered. Radiology: ordered.  Risk Prescription drug management.   This patient presents to the ED for concern of shortness of breath, cough, this involves an extensive number of treatment options, and is a  complaint that carries with it a high risk of complications and morbidity.  I considered the following differential and admission for this acute, potentially life threatening condition.  The differential diagnosis includes ACS, PE, pneumothorax, pneumonia, viral etiology, bronchitis, COPD  MDM:    This is a 52 year old male who presents with mostly shortness of breath and cough.  He is nontoxic.  He is afebrile.  O2 sats 98%.  He has an occasional wheeze on exam but is in no respiratory distress.  Chest x-ray without  pneumonia or pneumothorax.  EKG shows no acute ischemic changes.  Troponin is downtrending.  It is now 66.  Most recent hospitalization downtrended to the low 100s.  Will repeat to ensure no upward trend.  Patient did test positive for influenza.  Creatinine is slightly elevated at 1.73 and overall he looks a little volume down.  He was allowed to orally hydrate without difficulty.  He was given an albuterol inhaler for likely bronchitis component of his viral illness.  Will ambulate with pulse ox.  If repeat troponin and ambulation are reassuring, he can be discharged home.  (Labs, imaging, consults)  Labs: I Ordered, and personally interpreted labs.  The pertinent results include: CBC, BMP, troponin  Imaging Studies ordered: I ordered imaging studies including chest x-ray I independently visualized and interpreted imaging. I agree with the radiologist interpretation  Additional history obtained from chart review.  External records from outside source obtained and reviewed including discharge summary  Cardiac Monitoring: The patient was maintained on a cardiac monitor.  If on the cardiac monitor, I personally viewed and interpreted the cardiac monitored which showed an underlying rhythm of: Sinus  Reevaluation: After the interventions noted above, I reevaluated the patient and found that they have :improved  Social Determinants of Health:  poor social situation  Disposition:  Likely discharge  Co morbidities that complicate the patient evaluation  Past Medical History:  Diagnosis Date   Aplastic anemia (HCC)    Arthritis    "left ankle" (10/17/2014)   Bilateral pneumonia 10/17/2014   Hepatitis    "think it was B" (10/17/2014)   History of blood transfusion "several"   "related to aplastic anemia"   Hypertension    Laceration of spleen    s/p embolization   Polysubstance abuse (HCC)    cocaine, benzo's, opiates, THC   Sleep apnea    "wore mask in prison; 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" (10/17/2014)     Medicines Meds ordered this encounter  Medications   albuterol (VENTOLIN HFA) 108 (90 Base) MCG/ACT inhaler 2 puff    I have reviewed the patients home medicines and have made adjustments as needed  Problem List / ED Course: Problem List Items Addressed This Visit   None Visit Diagnoses       Influenza    -  Primary                   Final Clinical Impression(s) / ED Diagnoses Final diagnoses:  Influenza    Rx / DC Orders ED Discharge Orders     None         Shon Baton, MD 04/24/23 [phone]

## 2023-04-24 NOTE — ED Notes (Signed)
Ambulatory pulse ox, pt 96% RA.

## 2023-04-24 NOTE — ED Triage Notes (Signed)
Pt has hx of hernia and endorses multiple episodes of  diarrhea x 1 week.  Complains of 7//10 ABD pain.

## 2023-04-24 NOTE — Discharge Instructions (Addendum)
You were seen here today for a hernia.   Please use Motrin and Tylenol as needed for pain.  Please follow-up with your PCP.  Please return to the emergency department for inability to reduce your hernia, chest pain, shortness of breath, severe vomiting or inability to keep down food, loss of consciousness, or any worsening symptom or concern.

## 2023-04-24 NOTE — ED Notes (Signed)
Patient stated to this RN that he does not want to try walking until finishing oral rehydration.

## 2023-04-24 NOTE — Discharge Instructions (Addendum)
You were seen today and found to have the flu.  Use the inhaler as needed for ongoing respiratory symptoms.  You should stop smoking.  If you develop any new or worsening symptoms, you should be reevaluated.  Make sure that you are staying hydrated.  Take Tylenol or ibuprofen as needed for body aches or pains.

## 2023-05-08 ENCOUNTER — Emergency Department (HOSPITAL_COMMUNITY): Payer: Medicare HMO

## 2023-05-08 ENCOUNTER — Inpatient Hospital Stay (HOSPITAL_COMMUNITY)
Admission: EM | Admit: 2023-05-08 | Discharge: 2023-05-11 | DRG: 291 | Disposition: A | Discharge status: Home / Self Care | Payer: Medicare HMO | Attending: Family Medicine | Admitting: Family Medicine

## 2023-05-08 DIAGNOSIS — F17210 Nicotine dependence, cigarettes, uncomplicated: Secondary | ICD-10-CM | POA: Diagnosis present

## 2023-05-08 DIAGNOSIS — I130 Hypertensive heart and chronic kidney disease with heart failure and stage 1 through stage 4 chronic kidney disease, or unspecified chronic kidney disease: Secondary | ICD-10-CM | POA: Diagnosis not present

## 2023-05-08 DIAGNOSIS — T501X6A Underdosing of loop [high-ceiling] diuretics, initial encounter: Secondary | ICD-10-CM | POA: Diagnosis present

## 2023-05-08 DIAGNOSIS — Z91148 Patient's other noncompliance with medication regimen for other reason: Secondary | ICD-10-CM

## 2023-05-08 DIAGNOSIS — E876 Hypokalemia: Secondary | ICD-10-CM | POA: Diagnosis not present

## 2023-05-08 DIAGNOSIS — Z79899 Other long term (current) drug therapy: Secondary | ICD-10-CM | POA: Diagnosis not present

## 2023-05-08 DIAGNOSIS — Z91014 Allergy to mammalian meats: Secondary | ICD-10-CM | POA: Diagnosis not present

## 2023-05-08 DIAGNOSIS — I110 Hypertensive heart disease with heart failure: Secondary | ICD-10-CM | POA: Diagnosis not present

## 2023-05-08 DIAGNOSIS — N182 Chronic kidney disease, stage 2 (mild): Secondary | ICD-10-CM | POA: Diagnosis present

## 2023-05-08 DIAGNOSIS — I160 Hypertensive urgency: Secondary | ICD-10-CM | POA: Diagnosis present

## 2023-05-08 DIAGNOSIS — Z8619 Personal history of other infectious and parasitic diseases: Secondary | ICD-10-CM

## 2023-05-08 DIAGNOSIS — B182 Chronic viral hepatitis C: Secondary | ICD-10-CM | POA: Diagnosis not present

## 2023-05-08 DIAGNOSIS — D631 Anemia in chronic kidney disease: Secondary | ICD-10-CM | POA: Diagnosis present

## 2023-05-08 DIAGNOSIS — K115 Sialolithiasis: Secondary | ICD-10-CM | POA: Diagnosis present

## 2023-05-08 DIAGNOSIS — Z7984 Long term (current) use of oral hypoglycemic drugs: Secondary | ICD-10-CM

## 2023-05-08 DIAGNOSIS — Z91018 Allergy to other foods: Secondary | ICD-10-CM | POA: Diagnosis not present

## 2023-05-08 DIAGNOSIS — R0602 Shortness of breath: Secondary | ICD-10-CM

## 2023-05-08 DIAGNOSIS — I6523 Occlusion and stenosis of bilateral carotid arteries: Secondary | ICD-10-CM | POA: Diagnosis not present

## 2023-05-08 DIAGNOSIS — Z801 Family history of malignant neoplasm of trachea, bronchus and lung: Secondary | ICD-10-CM | POA: Diagnosis not present

## 2023-05-08 DIAGNOSIS — Z91012 Allergy to eggs: Secondary | ICD-10-CM | POA: Diagnosis not present

## 2023-05-08 DIAGNOSIS — I5023 Acute on chronic systolic (congestive) heart failure: Principal | ICD-10-CM | POA: Diagnosis present

## 2023-05-08 DIAGNOSIS — Z800 Family history of malignant neoplasm of digestive organs: Secondary | ICD-10-CM

## 2023-05-08 DIAGNOSIS — Z91199 Patient's noncompliance with other medical treatment and regimen due to unspecified reason: Secondary | ICD-10-CM | POA: Diagnosis not present

## 2023-05-08 DIAGNOSIS — F1410 Cocaine abuse, uncomplicated: Secondary | ICD-10-CM | POA: Diagnosis present

## 2023-05-08 DIAGNOSIS — R6884 Jaw pain: Secondary | ICD-10-CM | POA: Diagnosis not present

## 2023-05-08 DIAGNOSIS — I441 Atrioventricular block, second degree: Secondary | ICD-10-CM | POA: Diagnosis present

## 2023-05-08 DIAGNOSIS — I509 Heart failure, unspecified: Secondary | ICD-10-CM | POA: Diagnosis not present

## 2023-05-08 DIAGNOSIS — R079 Chest pain, unspecified: Secondary | ICD-10-CM | POA: Insufficient documentation

## 2023-05-08 DIAGNOSIS — I10 Essential (primary) hypertension: Secondary | ICD-10-CM | POA: Diagnosis present

## 2023-05-08 DIAGNOSIS — R0989 Other specified symptoms and signs involving the circulatory and respiratory systems: Secondary | ICD-10-CM | POA: Diagnosis not present

## 2023-05-08 DIAGNOSIS — D696 Thrombocytopenia, unspecified: Secondary | ICD-10-CM | POA: Diagnosis present

## 2023-05-08 DIAGNOSIS — J449 Chronic obstructive pulmonary disease, unspecified: Secondary | ICD-10-CM | POA: Diagnosis present

## 2023-05-08 DIAGNOSIS — I517 Cardiomegaly: Secondary | ICD-10-CM | POA: Diagnosis not present

## 2023-05-08 DIAGNOSIS — F1910 Other psychoactive substance abuse, uncomplicated: Secondary | ICD-10-CM | POA: Diagnosis present

## 2023-05-08 DIAGNOSIS — R509 Fever, unspecified: Secondary | ICD-10-CM | POA: Diagnosis not present

## 2023-05-08 LAB — CBC
HCT: 36.4 % — ABNORMAL LOW (ref 39.0–52.0)
Hemoglobin: 12.2 g/dL — ABNORMAL LOW (ref 13.0–17.0)
MCH: 34.9 pg — ABNORMAL HIGH (ref 26.0–34.0)
MCHC: 33.5 g/dL (ref 30.0–36.0)
MCV: 104 fL — ABNORMAL HIGH (ref 80.0–100.0)
Platelets: 121 10*3/uL — ABNORMAL LOW (ref 150–400)
RBC: 3.5 MIL/uL — ABNORMAL LOW (ref 4.22–5.81)
RDW: 17.4 % — ABNORMAL HIGH (ref 11.5–15.5)
WBC: 4.4 10*3/uL (ref 4.0–10.5)
nRBC: 0 % (ref 0.0–0.2)

## 2023-05-08 LAB — BASIC METABOLIC PANEL
Anion gap: 9 (ref 5–15)
BUN: 17 mg/dL (ref 6–20)
CO2: 23 mmol/L (ref 22–32)
Calcium: 8.6 mg/dL — ABNORMAL LOW (ref 8.9–10.3)
Chloride: 106 mmol/L (ref 98–111)
Creatinine, Ser: 1.31 mg/dL — ABNORMAL HIGH (ref 0.61–1.24)
GFR, Estimated: >60 mL/min (ref >60)
Glucose, Bld: 134 mg/dL — ABNORMAL HIGH (ref 70–99)
Potassium: 4 mmol/L (ref 3.5–5.1)
Sodium: 138 mmol/L (ref 135–145)

## 2023-05-08 LAB — RESP PANEL BY RT-PCR (RSV, FLU A&B, COVID)  RVPGX2
Influenza A by PCR: NEGATIVE
Influenza B by PCR: NEGATIVE
Resp Syncytial Virus by PCR: NEGATIVE
SARS Coronavirus 2 by RT PCR: NEGATIVE

## 2023-05-08 LAB — TROPONIN I (HIGH SENSITIVITY)
Troponin I (High Sensitivity): 80 ng/L — ABNORMAL HIGH (ref <18)
Troponin I (High Sensitivity): 84 ng/L — ABNORMAL HIGH (ref <18)

## 2023-05-08 LAB — BRAIN NATRIURETIC PEPTIDE: B Natriuretic Peptide: 1836.7 pg/mL — ABNORMAL HIGH (ref 0.0–100.0)

## 2023-05-08 LAB — I-STAT CG4 LACTIC ACID, ED: Lactic Acid, Venous: 1.1 mmol/L (ref 0.5–1.9)

## 2023-05-08 MED ORDER — NITROGLYCERIN 0.4 MG SL SUBL
0.4000 mg | SUBLINGUAL_TABLET | SUBLINGUAL | Status: DC | PRN
  Administered 2023-05-08: 0.4 mg via SUBLINGUAL
  Filled 2023-05-08: qty 1

## 2023-05-08 MED ORDER — FUROSEMIDE 10 MG/ML IJ SOLN
60.0000 mg | Freq: Once | INTRAMUSCULAR | Status: AC
Start: 2023-05-08 — End: 2023-05-08
  Administered 2023-05-08: 60 mg via INTRAVENOUS
  Filled 2023-05-08: qty 6

## 2023-05-08 MED ORDER — SODIUM CHLORIDE 0.9 % IV SOLN
500.0000 mg | Freq: Once | INTRAVENOUS | Status: AC
  Administered 2023-05-08: 500 mg via INTRAVENOUS
  Filled 2023-05-08: qty 5

## 2023-05-08 MED ORDER — SODIUM CHLORIDE 0.9 % IV SOLN
2.0000 g | Freq: Once | INTRAVENOUS | Status: AC
  Administered 2023-05-08: 2 g via INTRAVENOUS
  Filled 2023-05-08: qty 20
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
## 2023-05-08 NOTE — ED Provider Triage Note (Signed)
Emergency Medicine Provider Triage Evaluation Note  Charles [name] , a 52 y.o. male  was evaluated in triage.  Pt complains of cough and shortness of breath.  Was diagnosed with flu 2 weeks ago.  Has been feeling very short of breath last couple of days..  Review of Systems  Positive:  Negative:   Physical Exam  BP (!) 185/129 (BP Location: Right Arm)   Pulse 96   Temp 98.2 F (36.8 C) (Oral)   Resp (!) 30   SpO2 96%  Gen:   Awake, no distress   Resp:  Tachypnea, wheezing MSK:   Moves extremities without difficulty  Other:    Medical Decision Making  Medically screening exam initiated at 7:06 PM.  Appropriate orders placed.  Jimmy Wiland was informed that the remainder of the evaluation will be completed by another provider, this initial triage assessment does not replace that evaluation, and the importance of remaining in the ED until their evaluation is complete.  52 year old male here today with cough and shortness of breath 3 weeks following influenza diagnosis.  Patient's history, exam is concerning for pneumonia.  Put in sepsis protocols for the patient.  Advised triage nursing staff that I believe patient should be placed into a room.   Anders Simmonds T, DO 05/08/23 1906

## 2023-05-08 NOTE — ED Triage Notes (Signed)
Pt reports worsening SOB since being discharged and diagnosed with the flu. Pt also endorses N/V, chills, tactile fevers. Pt also endorses CP.

## 2023-05-09 ENCOUNTER — Encounter (HOSPITAL_COMMUNITY): Payer: Self-pay | Admitting: Internal Medicine

## 2023-05-09 ENCOUNTER — Other Ambulatory Visit: Payer: Self-pay

## 2023-05-09 DIAGNOSIS — I5023 Acute on chronic systolic (congestive) heart failure: Secondary | ICD-10-CM | POA: Diagnosis not present

## 2023-05-09 LAB — BASIC METABOLIC PANEL
Anion gap: 5 (ref 5–15)
BUN: 18 mg/dL (ref 6–20)
CO2: 29 mmol/L (ref 22–32)
Calcium: 8.8 mg/dL — ABNORMAL LOW (ref 8.9–10.3)
Chloride: 105 mmol/L (ref 98–111)
Creatinine, Ser: 1.37 mg/dL — ABNORMAL HIGH (ref 0.61–1.24)
GFR, Estimated: >60 mL/min (ref >60)
Glucose, Bld: 88 mg/dL (ref 70–99)
Potassium: 3.8 mmol/L (ref 3.5–5.1)
Sodium: 139 mmol/L (ref 135–145)

## 2023-05-09 MED ORDER — LOSARTAN POTASSIUM 25 MG PO TABS
25.0000 mg | ORAL_TABLET | Freq: Every day | ORAL | Status: DC
  Administered 2023-05-09 – 2023-05-11 (×3): 25 mg via ORAL
  Filled 2023-05-09 (×3): qty 1

## 2023-05-09 MED ORDER — SPIRONOLACTONE 12.5 MG HALF TABLET
12.5000 mg | ORAL_TABLET | Freq: Every day | ORAL | Status: DC
Start: 2023-05-09 — End: 2023-05-11
  Administered 2023-05-09 – 2023-05-11 (×3): 12.5 mg via ORAL
  Filled 2023-05-09 (×3): qty 1

## 2023-05-09 MED ORDER — FUROSEMIDE 10 MG/ML IJ SOLN
60.0000 mg | Freq: Two times a day (BID) | INTRAMUSCULAR | Status: DC
  Filled 2023-05-09: qty 6

## 2023-05-09 MED ORDER — ACETAMINOPHEN 650 MG RE SUPP: 650.0000 mg | Freq: Four times a day (QID) | RECTAL | Status: DC | PRN

## 2023-05-09 MED ORDER — NICOTINE POLACRILEX 2 MG MT GUM
2.0000 mg | CHEWING_GUM | OROMUCOSAL | Status: DC | PRN
  Administered 2023-05-11: 2 mg via ORAL
  Filled 2023-05-09 (×2): qty 1

## 2023-05-09 MED ORDER — POTASSIUM CHLORIDE CRYS ER 20 MEQ PO TBCR
40.0000 meq | EXTENDED_RELEASE_TABLET | Freq: Once | ORAL | Status: AC
  Administered 2023-05-09: 40 meq via ORAL
  Filled 2023-05-09: qty 2

## 2023-05-09 MED ORDER — FUROSEMIDE 10 MG/ML IJ SOLN
40.0000 mg | Freq: Two times a day (BID) | INTRAMUSCULAR | Status: DC
  Administered 2023-05-09 – 2023-05-10 (×2): 40 mg via INTRAVENOUS
  Filled 2023-05-09: qty 4

## 2023-05-09 MED ORDER — IPRATROPIUM-ALBUTEROL 0.5-2.5 (3) MG/3ML IN SOLN: 3.0000 mL | Freq: Four times a day (QID) | RESPIRATORY_TRACT | Status: DC | PRN

## 2023-05-09 MED ORDER — ACETAMINOPHEN 325 MG PO TABS
650.0000 mg | ORAL_TABLET | Freq: Four times a day (QID) | ORAL | Status: DC | PRN
  Administered 2023-05-10: 650 mg via ORAL
  Filled 2023-05-09: qty 2

## 2023-05-09 MED ORDER — FONDAPARINUX SODIUM 2.5 MG/0.5ML ~~LOC~~ SOLN
2.5000 mg | Freq: Every day | SUBCUTANEOUS | Status: DC
  Administered 2023-05-09: 2.5 mg via SUBCUTANEOUS
  Filled 2023-05-09 (×3): qty 0.5

## 2023-05-09 MED ORDER — EMPAGLIFLOZIN 10 MG PO TABS
10.0000 mg | ORAL_TABLET | Freq: Every day | ORAL | Status: DC
  Administered 2023-05-09 – 2023-05-11 (×3): 10 mg via ORAL
  Filled 2023-05-09 (×3): qty 1

## 2023-05-09 MED ORDER — NICOTINE 21 MG/24HR TD PT24
21.0000 mg | MEDICATED_PATCH | Freq: Every day | TRANSDERMAL | Status: DC
  Administered 2023-05-09 – 2023-05-11 (×3): 21 mg via TRANSDERMAL
  Filled 2023-05-09 (×3): qty 1

## 2023-05-09 MED ORDER — AMLODIPINE BESYLATE 10 MG PO TABS
10.0000 mg | ORAL_TABLET | Freq: Every day | ORAL | Status: DC
  Administered 2023-05-09 – 2023-05-11 (×3): 10 mg via ORAL
  Filled 2023-05-09 (×3): qty 1

## 2023-05-09 NOTE — Progress Notes (Signed)
PROGRESS NOTE    Charles [name]  HQI:[id number] DOB: 1971-01-04 DOA: 05/08/2023 PCP: Renaye Rakers, MD  Chief Complaint  Patient presents with   Shortness of Breath    Brief Narrative:   Charles [name] is Charles [name] 52 y.o. male with medical history significant of hypertension, chronic HFrEF, chronic anemia and thrombocytopenia, arthritis, polysubstance abuse (cocaine, opiates, THC, tobacco), chronic hep C, COPD presented to the ED with complaints of shortness of breath and cough.  Recently had influenza 2 weeks ago.  He's been admitted for Charles [name] HF exacerbation.  Assessment & Plan:   Principal Problem:   Acute on chronic HFrEF (heart failure with reduced ejection fraction) (HCC) Active Problems:   Polysubstance abuse (HCC)   HTN (hypertension)   Chronic hepatitis C (HCC)   Chest pain  Acute on chronic HFrEF C/o SOB with minimal exertion, not particularly overloaded on exam today CXR with cardiomegaly with vascular congestion, possible trace effusions Echo 01/2023 with EF 25-30%, global hypokinesis, moderate LVH, grade II diastolic dysfunction, mildly reduced RVSF Will continue IV lasix BID (at home supposed to be on 40 mg daily lasix, but reportedly not taking this).  Continue jardiance, losartan, spironolactone.   Strict I/O, daily weights If persistent SOB with exertion more impressive than exam, will obtain CT PE protocol  Hopefully can consider d/c in AM.   Hypertensive urgency Follow with resumption of his home meds and diuresis   Chest pain and elevated troponin Patient is reporting 2-day history of chest pain in the setting of ongoing crack cocaine abuse.  Troponin elevated but stable (80> 84).  Presentation not c/w ACS.  EKG poor quality, but with T wave inversions in II, III, aVF, V5, V6 -> repeat EKG.  Patient denies active chest pain at this time.   Chronic mild anemia and thrombocytopenia Stable.   Polysubstance abuse Patient is reporting ongoing crack cocaine abuse.   Continue to provide counseling.   Chronic hep C Outpatient GI follow-up.   COPD No wheezing.  DuoNeb as needed.   CKD stage II-IIIa Creatinine currently 1.3 and baseline appears to be 1.0-1.3.  Continue to monitor renal function.  Medication Nonadherence Reportedly not taking meds at home as well as illicit drug use Continue education     DVT prophylaxis: fondaparinux Code Status: full Family Communication: none Disposition:   Status is: Inpatient Remains inpatient appropriate because: need for continued inpatient care   Consultants:  none  Procedures:  none  Antimicrobials:  Anti-infectives (From admission, onward)    Start     Dose/Rate Route Frequency Ordered Stop   05/08/23 1915  cefTRIAXone (ROCEPHIN) 2 g in sodium chloride 0.9 % 100 mL IVPB        2 g 200 mL/hr over 30 Minutes Intravenous Once 05/08/23 1904 05/08/23 2201   05/08/23 1915  azithromycin (ZITHROMAX) 500 mg in sodium chloride 0.9 % 250 mL IVPB        500 mg 250 mL/hr over 60 Minutes Intravenous  Once 05/08/23 1904 05/08/23 2227       Subjective: C/o SOB with minimal exertion   Objective: Vitals:   05/09/23 0530 05/09/23 0600 05/09/23 0730 05/09/23 0833  BP: (!) 142/109 (!) 167/124 (!) 164/114   Pulse: 85 87 (!) 101   Resp:  18 20 17   Temp:    98.1 F (36.7 C)  TempSrc:    Oral  SpO2: 97% 99% 99%     Intake/Output Summary (Last 24 hours) at 05/09/2023 1324 Last data  filed at 05/09/2023 0016 Gross per 24 hour  Intake --  Output 3000 ml  Net -3000 ml   There were no vitals filed for this visit.  Examination:  General exam: Appears calm and comfortable  Respiratory system: Clear to auscultation. Respiratory effort normal. Cardiovascular system: RRR Central nervous system: Alert and oriented. No focal neurological deficits. Extremities: no LEE   Data Reviewed: I have personally reviewed following labs and imaging studies  CBC: Recent Labs  Lab 05/08/23 1853  WBC 4.4   HGB 12.2*  HCT 36.4*  MCV 104.0*  PLT 121*    Basic Metabolic Panel: Recent Labs  Lab 05/08/23 1853 05/09/23 0500  NA 138 139  K 4.0 3.8  CL 106 105  CO2 23 29  GLUCOSE 134* 88  BUN 17 18  CREATININE 1.31* 1.37*  CALCIUM 8.6* 8.8*    GFR: CrCl cannot be calculated (Unknown ideal weight.).  Liver Function Tests: No results for input(s): "AST", "ALT", "ALKPHOS", "BILITOT", "PROT", "ALBUMIN" in the last 168 hours.  CBG: No results for input(s): "GLUCAP" in the last 168 hours.   Recent Results (from the past 240 hours)  Blood Culture (routine x 2)     Status: None (Preliminary result)   Collection Time: 05/08/23  9:00 PM   Specimen: BLOOD RIGHT FOREARM  Result Value Ref Range Status   Specimen Description BLOOD RIGHT FOREARM  Final   Special Requests   Final    BOTTLES DRAWN AEROBIC AND ANAEROBIC Blood Culture adequate volume   Culture   Final    NO GROWTH < 12 HOURS Performed at [name], [address], [place], [place] [address]    Report Status PENDING  Incomplete  Blood Culture (routine x 2)     Status: None (Preliminary result)   Collection Time: 05/08/23  9:05 PM   Specimen: BLOOD  Result Value Ref Range Status   Specimen Description BLOOD RIGHT ANTECUBITAL  Final   Special Requests   Final    BOTTLES DRAWN AEROBIC AND ANAEROBIC Blood Culture adequate volume   Culture   Final    NO GROWTH < 12 HOURS Performed at [facility], [address], [place], [place] [address]    Report Status PENDING  Incomplete  Resp panel by RT-PCR (RSV, Flu Charles [name]&B, Covid) Anterior Nasal Swab     Status: None   Collection Time: 05/08/23  9:09 PM   Specimen: Anterior Nasal Swab  Result Value Ref Range Status   SARS Coronavirus 2 by RT PCR NEGATIVE NEGATIVE Final   Influenza [name] by PCR NEGATIVE NEGATIVE Final   Influenza B by PCR NEGATIVE NEGATIVE Final    Comment: (NOTE) The Xpert Xpress SARS-CoV-2/FLU/RSV plus assay is intended as an aid in the diagnosis of  influenza from Nasopharyngeal swab specimens and should not be used as Charles [name] sole basis for treatment. Nasal washings and aspirates are unacceptable for Xpert Xpress SARS-CoV-2/FLU/RSV testing.  Fact Sheet for Patients: BloggerCourse.com  Fact Sheet for Healthcare Providers: SeriousBroker.it  This test is not yet approved or cleared by the [place] FDA and has been authorized for detection and/or diagnosis of SARS-CoV-2 by FDA under an Emergency Use Authorization (EUA). This EUA will remain in effect (meaning this test can be used) for the duration of the COVID-19 declaration under Section 564(b)(1) of the Act, 21 U.S.C. section 360bbb-3(b)(1), unless the authorization is terminated or revoked.     Resp Syncytial Virus by PCR NEGATIVE NEGATIVE Final  Comment: (NOTE) Fact Sheet for Patients: BloggerCourse.com  Fact Sheet for Healthcare Providers: SeriousBroker.it  This test is not yet approved or cleared by the [place] FDA and has been authorized for detection and/or diagnosis of SARS-CoV-2 by FDA under an Emergency Use Authorization (EUA). This EUA will remain in effect (meaning this test can be used) for the duration of the COVID-19 declaration under Section 564(b)(1) of the Act, 21 U.S.C. section 360bbb-3(b)(1), unless the authorization is terminated or revoked.  Performed at [name], [address] [place], [place] [address]          Radiology Studies: DG Chest 2 View Result Date: 05/08/2023 CLINICAL DATA:  Shortness of breath and chills.  Fever. EXAM: CHEST - 2 VIEW COMPARISON:  Chest radiograph dated 04/23/2023. FINDINGS: There is cardiomegaly with vascular congestion. Trace bilateral pleural effusions may be present. No focal consolidation or pneumothorax. No acute osseous pathology. Degenerative changes of the spine. IMPRESSION: Cardiomegaly  with vascular congestion. Electronically Signed   By: Elgie Collard M.D.   On: 05/08/2023 19:25        Scheduled Meds:  amLODipine  10 mg Oral Daily   empagliflozin  10 mg Oral QAC breakfast   fondaparinux (ARIXTRA) injection  2.5 mg Subcutaneous Daily   furosemide  60 mg Intravenous BID   losartan  25 mg Oral Daily   spironolactone  12.5 mg Oral Daily   Continuous Infusions:   LOS: 1 day    Time spent: over 30 min    Lacretia Nicks, MD [facility]   To contact the attending provider between 7A-7P or the covering provider during after hours 7P-7A, please log into the web site [email or web address] and access using universal [place] password for that web site. If you do not have the password, please call the [name] operator.  05/09/2023, 8:35 AM

## 2023-05-09 NOTE — Progress Notes (Signed)
Heart Failure Navigator Progress Note  Assessed for Heart & Vascular TOC clinic readiness.  Patient with EF 25-30%. Declined offer of HF TOC at this time, also stated "he won't take any medicine when he leaves here."    Navigator will sign off at this time.   Rhae Hammock, BSN, Scientist, clinical (histocompatibility and immunogenetics) Only

## 2023-05-09 NOTE — Discharge Instructions (Signed)
Food Pantries A SHARED BLESSING FOOD PANTRY Provided by A SHARED BLESSING FOOD PANTRY [address] [place], [place] 2 EMERYWOOD BAPTIST [employer] Provided by [employer] FOOD PANTRY [address] [place], [place] 3 FREE INDEED FOOD PANTRY Provided by FREE INDEED OUTREACH MINISTRY [address], [place], [place] [address] [employer] Us FOOD PANTRY Provided by [employer] FOOD PANTRY [address] [place], [place] 5 [place] BAPTIST CHURCH FOOD PANTRY Provided by [place] BAPTIST CHURCH FOOD PANTRY [address] [place], [place] 6 BREAD OF LIFE FOOD BANK Provided by BREAD [employer] LIFE FOOD BANK [address] [place], [place] 7 [employer] PRESBYTERIAN Toys [employer] Us FOOD PANTRY Provided by [facility] [employer] FOOD PANTRY [address], [place], [place] 8 JULIAN [facility] FOOD PANTRY Provided by [employer] Us FOOD PANTRY [address] [place], [place], [place] 9 [place]. MATTHEWS [facility] FOOD PANTRY Provided by ST. MATTHEWS [employer] FOOD PANTRY [address] [place], [place] 10 [facility] FOOD PANTRY Provided by [employer]UM[employer] [facility] FOOD PANTRY [address], [place], [place]  FOOD PANTRY Provided by [facility] [address], [place], [place] 12 FIVE LOAVES TWO FISH FOOD PANTRY Provided by FIVE LOAVES TWO FISH FOOD PANTRY [address], [place], [place] 13 NEW BEGINNINGS FULL GOSPEL MINISTRIES Provided by NEW BEGINNINGS FULL GOSPEL MINISTRIES [address] [place], [place] 14 FOOD ASSISTANCE Provided by [employer] [place] [address], [place], [place] 15 FOOD PANTRY Provided by [employer] [facility] [place] [address] [place], [place] 16 MOBILE MARKET Provided by [employer] HEALTH [address] [place], [place] 16 FOOD PANTRY Provided by [employer] [address] [place], [place] 10 FOOD PANTRY Provided by The [place] [employer] DOOR MINISTRIES - [place] [address], [place], [place] 96 FOOD PANTRY Provided by [employer] Companies MINISTRY [address] [place], [place], [place] 20 EMERGENCY FOOD PANTRY Provided by [employer] - [place] [address] [place], [place]  FRESH [employer] Provided by OUT OF THE GARDEN PROJECT [address] [place], [place], [place] 22 FEED ONE SAVE ONE Provided by THE APPLE OF HIS EYE APOSTOLIC MINISTRIES [address], [place], [place] 23 FOOD ASSISTANCE Provided by [employer] [facility] [place] [address], [place], [place] 24 COMMUNITY SUPPORT AND NUTRITION PROGRAM Provided by ONE STEP FURTHER [address] [place], [place] 04 COMMUNITY SUPPORT AND NUTRITION PROGRAM Provided by ONE STEP FURTHER [address] [place], [place] 26 FAMILY MARKET Provided by [employer] [address], [place], [place] 54 BREAKTHROUGH COMMUNITY CHURCH FOOD PANTRY Provided by [employer] Corporation FOOD PANTRY [address], [place], [place] 28 CHRISTIAN ASSEMBLY EMERGENCY FOOD PANTRY Provided by Johnson [employer] Johnson EMERGENCY FOOD PANTRY [address], [place], [place] 09 COMMUNITY FOOD DISTRIBUTION Provided by [facility] [place] [address] [place], [place] 30 COOLEEMEE COMMUNITY FOOD PANTRY Provided by [employer] COMMUNITY FOOD PANTRY [address], [place], [place]       Intensive Outpatient Programs   [facility]                                  [facility] [address]  [address] [place],  [place]                                                           [place], [place] [phone]                                                               [phone]   [facility]                [facility]         (Inpatient and outpatient)                                           [phone] (Suboxone and Methadone) [address]                                                                                                                 [phone]                                                                                                     [facility]: [facility]                                               [facility] - Intensive Outpatient [address]                                                          [address] [place], [place] [address]  [place], [place]  [phone]                                                              [phone]   [facility]ye (Outpatient, Inpatient, Chemical       [facility] (Groups and Residental) (insurance only) [phone]                                              [place], [place]                                                                                                             [phone]                                                    [facility]                                       [facility] (for caregivers and family) [address]                                                    [address] [place]                                                          [place], [place] [phone]                                                              [phone]   Residential Treatment Programs   [facility]          [place](2 years) Residential: 90 days)                 [facility] ([facility] [address] [place]  [address] [place], [place]                                                     [place], [place] [phone]                                                              [phone] or [phone]   [facility]                                              [facility] [address]                                                               [address] [place], [place]                                                          [place], [place] [phone]                                                              [phone]   [facility]                                [facility] Services (RTS) [address]                                                           [address] [place], [place] [address]                                                 [place], [place] [phone]                                                              [phone] Admissions: 8am-3pm M-F  BATS Program: Residential Program [phone] Days)                     [facility]: [facility]  [place], [place]                                                    [place], [place]  [phone] or [phone]                                             (Walk in Hours over the weekend or by referral)     Mobil Crisis: [facility]rapeutic Alternatives:[phone] (for crisis response 24 hours a day)

## 2023-05-09 NOTE — Care Management (Signed)
Transition of Care [facility]) - Inpatient Brief Assessment   Patient Details  Name: Charles [name] MRN: [id number] Date of Birth: [birth year]  Transition of Care [facility]) CM/SW Contact:    Lockie Pares, RN Phone Number: 05/09/2023, 9:06 AM   Clinical Narrative: 52 yo old presented with [facility]. He is homeless, illicit drug use. Was inpatient 2 weeks ago with same, given resources, stated at that time he was not going to quit. Will add food pantry and SA resources to chart. Recommend palliative care consult to establish goals of care TOC will follow for needs, recommendations, and transitions of care.   Transition of Care Asessment: Insurance and Status: Insurance coverage has been reviewed Patient has primary care physician: Yes Home environment has been reviewed: Homeless Prior level of function:: Independent Prior/Current Home Services: No current home services Social Drivers of Health Review: SDOH reviewed needs interventions Readmission risk has been reviewed: Yes (High Risk) Transition of care needs: transition of care needs identified, TOC will continue to follow

## 2023-05-10 ENCOUNTER — Inpatient Hospital Stay (HOSPITAL_COMMUNITY): Payer: Medicare HMO

## 2023-05-10 DIAGNOSIS — I5023 Acute on chronic systolic (congestive) heart failure: Secondary | ICD-10-CM | POA: Diagnosis not present

## 2023-05-10 LAB — COMPREHENSIVE METABOLIC PANEL
ALT: 20 U/L (ref 0–44)
AST: 23 U/L (ref 15–41)
Albumin: 2.8 g/dL — ABNORMAL LOW (ref 3.5–5.0)
Alkaline Phosphatase: 75 U/L (ref 38–126)
Anion gap: 8 (ref 5–15)
BUN: 19 mg/dL (ref 6–20)
CO2: 26 mmol/L (ref 22–32)
Calcium: 8.9 mg/dL (ref 8.9–10.3)
Chloride: 102 mmol/L (ref 98–111)
Creatinine, Ser: 1.3 mg/dL — ABNORMAL HIGH (ref 0.61–1.24)
GFR, Estimated: >60 mL/min (ref >60)
Glucose, Bld: 112 mg/dL — ABNORMAL HIGH (ref 70–99)
Potassium: 4.3 mmol/L (ref 3.5–5.1)
Sodium: 136 mmol/L (ref 135–145)
Total Bilirubin: 0.7 mg/dL (ref <1.2)
Total Protein: 6.6 g/dL (ref 6.5–8.1)

## 2023-05-10 LAB — CBC WITH DIFFERENTIAL/PLATELET
Abs Immature Granulocytes: 0.01 10*3/uL (ref 0.00–0.07)
Basophils Absolute: 0 10*3/uL (ref 0.0–0.1)
Basophils Relative: 0 %
Eosinophils Absolute: 0 10*3/uL (ref 0.0–0.5)
Eosinophils Relative: 1 %
HCT: 40.2 % (ref 39.0–52.0)
Hemoglobin: 13.6 g/dL (ref 13.0–17.0)
Immature Granulocytes: 0 %
Lymphocytes Relative: 34 %
Lymphs Abs: 1.2 10*3/uL (ref 0.7–4.0)
MCH: 34.1 pg — ABNORMAL HIGH (ref 26.0–34.0)
MCHC: 33.8 g/dL (ref 30.0–36.0)
MCV: 100.8 fL — ABNORMAL HIGH (ref 80.0–100.0)
Monocytes Absolute: 0.5 10*3/uL (ref 0.1–1.0)
Monocytes Relative: 16 %
Neutro Abs: 1.7 10*3/uL (ref 1.7–7.7)
Neutrophils Relative %: 49 %
Platelets: 120 10*3/uL — ABNORMAL LOW (ref 150–400)
RBC: 3.99 MIL/uL — ABNORMAL LOW (ref 4.22–5.81)
RDW: 17 % — ABNORMAL HIGH (ref 11.5–15.5)
WBC: 3.5 10*3/uL — ABNORMAL LOW (ref 4.0–10.5)
nRBC: 0 % (ref 0.0–0.2)

## 2023-05-10 LAB — MAGNESIUM: Magnesium: 2.1 mg/dL (ref 1.7–2.4)

## 2023-05-10 LAB — PHOSPHORUS: Phosphorus: 3.5 mg/dL (ref 2.5–4.6)

## 2023-05-10 MED ORDER — FUROSEMIDE 40 MG PO TABS
40.0000 mg | ORAL_TABLET | Freq: Every day | ORAL | Status: DC
Start: 2023-05-11 — End: 2023-05-11
  Administered 2023-05-11: 40 mg via ORAL
  Filled 2023-05-10: qty 1

## 2023-05-10 MED ORDER — IOHEXOL 350 MG/ML SOLN
75.0000 mL | Freq: Once | INTRAVENOUS | Status: AC | PRN
  Administered 2023-05-10: 75 mL via INTRAVENOUS

## 2023-05-10 NOTE — Plan of Care (Signed)
  Problem: Health Behavior/Discharge Planning: Goal: Ability to manage health-related needs will improve Outcome: Not Progressing   

## 2023-05-10 NOTE — Progress Notes (Signed)
Reviewed 2nd Degree AVB and found 2 beats labeled at 2254, no known history per patient. Will continue to monitor and patient remains NSR with some brady events.

## 2023-05-10 NOTE — Progress Notes (Signed)
PROGRESS NOTE    Saarim Bertot  YSA:[id number] DOB: 1971/03/01 DOA: 05/08/2023 PCP: Renaye Rakers, MD  Chief Complaint  Patient presents with   Shortness of Breath    Brief Narrative:   Aitan Beckmann is Tova Vater 52 y.o. male with medical history significant of hypertension, chronic HFrEF, chronic anemia and thrombocytopenia, arthritis, polysubstance abuse (cocaine, opiates, THC, tobacco), chronic hep C, COPD presented to the ED with complaints of shortness of breath and cough.  Recently had influenza 2 weeks ago.  He's been admitted for Kayln Garceau HF exacerbation.  Assessment & Plan:   Principal Problem:   Acute on chronic HFrEF (heart failure with reduced ejection fraction) (HCC) Active Problems:   Polysubstance abuse (HCC)   HTN (hypertension)   Chronic hepatitis C (HCC)   Chest pain  Acute on chronic HFrEF Improving on exam CXR with cardiomegaly with vascular congestion, possible trace effusions Echo 01/2023 with EF 25-30%, global hypokinesis, moderate LVH, grade II diastolic dysfunction, mildly reduced RVSF IV lasix today, transition to PO tomorrow.  Continue jardiance, losartan, spironolactone.   Adherence to his home regimen is going to be the tough part.  He told HF navigator yesterday he won't take any meds when he leaves.  Today he elaborated this was because of inconsistent living situation, "hard to take all those meds".  Strict I/O, daily weights   Hypertensive urgency Follow with resumption of his home meds and diuresis   Chest pain and elevated troponin Patient is reporting 2-day history of chest pain in the setting of ongoing crack cocaine abuse.  Troponin elevated but stable (80> 84).  Presentation not c/w ACS.  EKG poor quality, but with T wave inversions in II, III, aVF, V5, V6 -> repeat EKG.  Patient denies active chest pain at this time.   2nd Degree AV block type II Noted overnight, will continue to monitor on telemetry Needs cardiology follow up outpatient    Submandibular Pain and Swelling Will follow CT neck   Chronic mild anemia and thrombocytopenia Stable.   Polysubstance abuse Patient is reporting ongoing crack cocaine abuse.  Continue to provide counseling.   Chronic hep C Outpatient GI follow-up.   COPD No wheezing.  DuoNeb as needed.   CKD stage II-IIIa Creatinine currently 1.3 and baseline appears to be 1.0-1.3.  Continue to monitor renal function.  Medication Nonadherence Reportedly not taking meds at home as well as illicit drug use Continue education     DVT prophylaxis: fondaparinux Code Status: full Family Communication: none Disposition:   Status is: Inpatient Remains inpatient appropriate because: need for continued inpatient care   Consultants:  none  Procedures:  none  Antimicrobials:  Anti-infectives (From admission, onward)    Start     Dose/Rate Route Frequency Ordered Stop   05/08/23 1915  cefTRIAXone (ROCEPHIN) 2 g in sodium chloride 0.9 % 100 mL IVPB        2 g 200 mL/hr over 30 Minutes Intravenous Once 05/08/23 1904 05/08/23 2201   05/08/23 1915  azithromycin (ZITHROMAX) 500 mg in sodium chloride 0.9 % 250 mL IVPB        500 mg 250 mL/hr over 60 Minutes Intravenous  Once 05/08/23 1904 05/08/23 2227       Subjective: C/o pain in bottom of his mouth   Objective: Vitals:   05/09/23 2022 05/10/23 0002 05/10/23 0604 05/10/23 0714  BP: 135/89 (!) 142/81 (!) 151/105 (!) 134/100  Pulse: 72   89  Resp: (!) 22 16 (!)  25 20  Temp: 98.2 F (36.8 C) 98.2 F (36.8 C) 98.3 F (36.8 C) 98.2 F (36.8 C)  TempSrc: Oral Oral Oral Oral  SpO2:      Weight:   67.9 kg   Height:        Intake/Output Summary (Last 24 hours) at 05/10/2023 0955 Last data filed at 05/10/2023 0800 Gross per 24 hour  Intake 1174 ml  Output 2100 ml  Net -926 ml   Filed Weights   05/09/23 1131 05/10/23 0604  Weight: 68.6 kg 67.9 kg    Examination:  General: No acute distress. Tender LAD to R submandibular  region, some preauricular TTP (though no significant LAD or swelling appreciated) Cardiovascular: RRR Lungs: unlabored Neurological: Alert and oriented 3. Moves all extremities 4 with equal strength. Cranial nerves II through XII grossly intact. Extremities: No clubbing or cyanosis. No edema  Data Reviewed: I have personally reviewed following labs and imaging studies  CBC: Recent Labs  Lab 05/08/23 1853 05/10/23 0214  WBC 4.4 3.5*  NEUTROABS  --  1.7  HGB 12.2* 13.6  HCT 36.4* 40.2  MCV 104.0* 100.8*  PLT 121* 120*    Basic Metabolic Panel: Recent Labs  Lab 05/08/23 1853 05/09/23 0500 05/10/23 0214  NA 138 139 136  K 4.0 3.8 4.3  CL 106 105 102  CO2 23 29 26   GLUCOSE 134* 88 112*  BUN 17 18 19   CREATININE 1.31* 1.37* 1.30*  CALCIUM 8.6* 8.8* 8.9  MG  --   --  2.1  PHOS  --   --  3.5    GFR: Estimated Creatinine Clearance: 60 mL/min (Joscelynn Brutus) (by C-G formula based on SCr of 1.3 mg/dL (H)).  Liver Function Tests: Recent Labs  Lab 05/10/23 0214  AST 23  ALT 20  ALKPHOS 75  BILITOT 0.7  PROT 6.6  ALBUMIN 2.8*    CBG: No results for input(s): "GLUCAP" in the last 168 hours.   Recent Results (from the past 240 hours)  Blood Culture (routine x 2)     Status: None (Preliminary result)   Collection Time: 05/08/23  9:00 PM   Specimen: BLOOD RIGHT FOREARM  Result Value Ref Range Status   Specimen Description BLOOD RIGHT FOREARM  Final   Special Requests   Final    BOTTLES DRAWN AEROBIC AND ANAEROBIC Blood Culture adequate volume   Culture   Final    NO GROWTH 2 DAYS Performed at [facility], [address] [place], [place] [address]    Report Status PENDING  Incomplete  Blood Culture (routine x 2)     Status: None (Preliminary result)   Collection Time: 05/08/23  9:05 PM   Specimen: BLOOD  Result Value Ref Range Status   Specimen Description BLOOD RIGHT ANTECUBITAL  Final   Special Requests   Final    BOTTLES DRAWN AEROBIC AND ANAEROBIC Blood  Culture adequate volume   Culture   Final    NO GROWTH 2 DAYS Performed at [facility], [address] [place], [place] [address]    Report Status PENDING  Incomplete  Resp panel by RT-PCR (RSV, Flu Dyshawn Cangelosi&B, Covid) Anterior Nasal Swab     Status: None   Collection Time: 05/08/23  9:09 PM   Specimen: Anterior Nasal Swab  Result Value Ref Range Status   SARS Coronavirus 2 by RT PCR NEGATIVE NEGATIVE Final   Influenza Wayburn Shaler by PCR NEGATIVE NEGATIVE Final   Influenza B by PCR NEGATIVE NEGATIVE  Final    Comment: (NOTE) The Xpert Xpress SARS-CoV-2/FLU/RSV plus assay is intended as an aid in the diagnosis of influenza from Nasopharyngeal swab specimens and should not be used as Damiano Stamper sole basis for treatment. Nasal washings and aspirates are unacceptable for Xpert Xpress SARS-CoV-2/FLU/RSV testing.  Fact Sheet for Patients: BloggerCourse.com  Fact Sheet for Healthcare Providers: SeriousBroker.it  This test is not yet approved or cleared by the [place] FDA and has been authorized for detection and/or diagnosis of SARS-CoV-2 by FDA under an Emergency Use Authorization (EUA). This EUA will remain in effect (meaning this test can be used) for the duration of the COVID-19 declaration under Section 564(b)(1) of the Act, 21 U.S.C. section 360bbb-3(b)(1), unless the authorization is terminated or revoked.     Resp Syncytial Virus by PCR NEGATIVE NEGATIVE Final    Comment: (NOTE) Fact Sheet for Patients: BloggerCourse.com  Fact Sheet for Healthcare Providers: SeriousBroker.it  This test is not yet approved or cleared by the [place] FDA and has been authorized for detection and/or diagnosis of SARS-CoV-2 by FDA under an Emergency Use Authorization (EUA). This EUA will remain in effect (meaning this test can be used) for the duration of the COVID-19 declaration under Section  564(b)(1) of the Act, 21 U.S.C. section 360bbb-3(b)(1), unless the authorization is terminated or revoked.  Performed at [facility], [address], [place], [place] [address]          Radiology Studies: DG Chest 2 View Result Date: 05/08/2023 CLINICAL DATA:  Shortness of breath and chills.  Fever. EXAM: CHEST - 2 VIEW COMPARISON:  Chest radiograph dated 04/23/2023. FINDINGS: There is cardiomegaly with vascular congestion. Trace bilateral pleural effusions may be present. No focal consolidation or pneumothorax. No acute osseous pathology. Degenerative changes of the spine. IMPRESSION: Cardiomegaly with vascular congestion. Electronically Signed   By: Elgie Collard M.D.   On: 05/08/2023 19:25        Scheduled Meds:  amLODipine  10 mg Oral Daily   empagliflozin  10 mg Oral QAC breakfast   fondaparinux (ARIXTRA) injection  2.5 mg Subcutaneous Daily   furosemide  40 mg Intravenous BID   losartan  25 mg Oral Daily   nicotine  21 mg Transdermal Daily   spironolactone  12.5 mg Oral Daily   Continuous Infusions:   LOS: 2 days    Time spent: over 30 min    Lacretia Nicks, MD [facility]   To contact the attending provider between 7A-7P or the covering provider during after hours 7P-7A, please log into the web site [email or web address] and access using universal [place] password for that web site. If you do not have the password, please call the hospital operator.  05/10/2023, 9:55 AM

## 2023-05-11 DIAGNOSIS — I5023 Acute on chronic systolic (congestive) heart failure: Secondary | ICD-10-CM | POA: Diagnosis not present

## 2023-05-11 LAB — CBC WITH DIFFERENTIAL/PLATELET
Abs Immature Granulocytes: 0.02 10*3/uL (ref 0.00–0.07)
Basophils Absolute: 0 10*3/uL (ref 0.0–0.1)
Basophils Relative: 0 %
Eosinophils Absolute: 0 10*3/uL (ref 0.0–0.5)
Eosinophils Relative: 1 %
HCT: 39.1 % (ref 39.0–52.0)
Hemoglobin: 13.2 g/dL (ref 13.0–17.0)
Immature Granulocytes: 1 %
Lymphocytes Relative: 36 %
Lymphs Abs: 1.4 10*3/uL (ref 0.7–4.0)
MCH: 34.2 pg — ABNORMAL HIGH (ref 26.0–34.0)
MCHC: 33.8 g/dL (ref 30.0–36.0)
MCV: 101.3 fL — ABNORMAL HIGH (ref 80.0–100.0)
Monocytes Absolute: 0.6 10*3/uL (ref 0.1–1.0)
Monocytes Relative: 16 %
Neutro Abs: 1.8 10*3/uL (ref 1.7–7.7)
Neutrophils Relative %: 46 %
Platelets: 130 10*3/uL — ABNORMAL LOW (ref 150–400)
RBC: 3.86 MIL/uL — ABNORMAL LOW (ref 4.22–5.81)
RDW: 17.2 % — ABNORMAL HIGH (ref 11.5–15.5)
WBC: 3.9 10*3/uL — ABNORMAL LOW (ref 4.0–10.5)
nRBC: 0 % (ref 0.0–0.2)

## 2023-05-11 LAB — COMPREHENSIVE METABOLIC PANEL
ALT: 21 U/L (ref 0–44)
AST: 22 U/L (ref 15–41)
Albumin: 2.8 g/dL — ABNORMAL LOW (ref 3.5–5.0)
Alkaline Phosphatase: 73 U/L (ref 38–126)
Anion gap: 7 (ref 5–15)
BUN: 27 mg/dL — ABNORMAL HIGH (ref 6–20)
CO2: 27 mmol/L (ref 22–32)
Calcium: 8.6 mg/dL — ABNORMAL LOW (ref 8.9–10.3)
Chloride: 101 mmol/L (ref 98–111)
Creatinine, Ser: 1.44 mg/dL — ABNORMAL HIGH (ref 0.61–1.24)
GFR, Estimated: 58 mL/min — ABNORMAL LOW (ref >60)
Glucose, Bld: 114 mg/dL — ABNORMAL HIGH (ref 70–99)
Potassium: 4.2 mmol/L (ref 3.5–5.1)
Sodium: 135 mmol/L (ref 135–145)
Total Bilirubin: 0.3 mg/dL (ref <1.2)
Total Protein: 6.6 g/dL (ref 6.5–8.1)

## 2023-05-11 LAB — MAGNESIUM: Magnesium: 2.4 mg/dL (ref 1.7–2.4)

## 2023-05-11 LAB — PHOSPHORUS: Phosphorus: 4.5 mg/dL (ref 2.5–4.6)

## 2023-05-11 MED ORDER — EMPAGLIFLOZIN 10 MG PO TABS: 10.0000 mg | ORAL_TABLET | Freq: Every day | ORAL | 0 refills | Status: DC

## 2023-05-11 MED ORDER — LOSARTAN POTASSIUM 25 MG PO TABS: 25.0000 mg | ORAL_TABLET | Freq: Every day | ORAL | 0 refills | Status: DC

## 2023-05-11 MED ORDER — CEFADROXIL 500 MG PO CAPS: 500.0000 mg | ORAL_CAPSULE | Freq: Two times a day (BID) | ORAL | 0 refills | Status: AC

## 2023-05-11 MED ORDER — FUROSEMIDE 40 MG PO TABS: 40.0000 mg | ORAL_TABLET | Freq: Every day | ORAL | 0 refills | Status: DC

## 2023-05-11 MED ORDER — SPIRONOLACTONE 25 MG PO TABS: 12.5000 mg | ORAL_TABLET | Freq: Every day | ORAL | 0 refills | Status: DC

## 2023-05-11 MED ORDER — AMLODIPINE BESYLATE 10 MG PO TABS: 10.0000 mg | ORAL_TABLET | Freq: Every day | ORAL | 0 refills | Status: DC

## 2023-05-11 MED ORDER — VITAMIN B-12 1000 MCG PO TABS: 1000.0000 ug | ORAL_TABLET | Freq: Every day | ORAL | 0 refills | Status: DC

## 2023-05-11 MED ORDER — ALBUTEROL SULFATE HFA 108 (90 BASE) MCG/ACT IN AERS
2.0000 | INHALATION_SPRAY | RESPIRATORY_TRACT | 2 refills | Status: DC | PRN
Start: 2023-05-11 — End: 2023-06-04

## 2023-05-11 MED ORDER — DICLOFENAC SODIUM 1 % EX GEL
2.0000 g | Freq: Four times a day (QID) | CUTANEOUS | Status: DC
Start: 2023-05-11 — End: 2023-05-11
  Filled 2023-05-11: qty 100

## 2023-05-11 NOTE — Discharge Summary (Signed)
Physician Discharge Summary  Charles [name] MWN:[id number] DOB: 22-Nov-1970 DOA: 05/08/2023  PCP: Renaye Rakers, MD  Admit date: 05/08/2023 Discharge date: 05/11/2023  Time spent: 40 minutes  Recommendations for Outpatient Follow-up:  Follow outpatient CBC/CMP  Ensure cardiology follow up for heart failure, also concern for 2nd degree heart block type 2 noted on tele Follow adherence to HF regimen  Needs ENT follow up for submandibular duct stone  Follow chronic hep C outpatient Encourage cessation of drug use  Discharge Diagnoses:  Principal Problem:   Acute on chronic HFrEF (heart failure with reduced ejection fraction) (HCC) Active Problems:   Polysubstance abuse (HCC)   HTN (hypertension)   Chronic hepatitis C (HCC)   Chest pain   Discharge Condition: stable  Diet recommendation: heart healthy  Filed Weights   05/09/23 1131 05/10/23 0604 05/11/23 0415  Weight: 68.6 kg 67.9 kg 68.5 kg    History of present illness:   Charles [name] is Charles [name] 52 y.o. male with medical history significant of hypertension, chronic HFrEF, chronic anemia and thrombocytopenia, arthritis, polysubstance abuse (cocaine, opiates, THC, tobacco), chronic hep C, COPD presented to the ED with complaints of shortness of breath and cough.   Recently had influenza 2 weeks ago.   He's been admitted for Charles [name] HF exacerbation.  He's improved with resumption of his heart failure meds and diuresis.  See below for additional details.  Hospital Course:  Assessment and Plan:  Acute on chronic HFrEF Improving on exam CXR with cardiomegaly with vascular congestion, possible trace effusions Echo 01/2023 with EF 25-30%, global hypokinesis, moderate LVH, grade II diastolic dysfunction, mildly reduced RVSF IV lasix today, transition to PO tomorrow.  Continue jardiance, losartan, spironolactone.   Adherence to his home regimen is going to be the tough part.  He told HF navigator yesterday he won't take any meds when he  leaves.  Today he elaborated this was because of inconsistent living situation, "hard to take all those meds".  He told me he'd take them if I send them in.  Will refill his home heart failure meds.  Encouraged continued compliance with regimen.  Needs outpatient follow up.  Strict I/O, daily weights   Hypertensive urgency BP meds resumed   Chest pain and elevated troponin Patient is reporting 2-day history of chest pain in the setting of ongoing crack cocaine abuse.  Troponin elevated but stable (80> 84).  Presentation not c/w ACS.  EKG poor quality, but with T wave inversions in II, III, aVF, V5, V6 -> repeat EKG.  Patient denies active chest pain at this time.   2nd Degree AV block type II Noted overnight, will continue to monitor on telemetry Needs cardiology follow up outpatient (discussed with cards on call 12/28)   Submandibular Duct Stone 12x9x20 mm with more anterior stone measuring 6 mm Reactive right level 2 LN's Multiple caries and periapical lucencies without soft tissue inflammatory change Discussed with ENT, they can follow this outpatient Will send with duricef to cover for possible infection   Chronic mild anemia and thrombocytopenia Stable.   Polysubstance abuse Patient is reporting ongoing crack cocaine abuse.  Continue to provide counseling.   Chronic hep C Outpatient GI follow-up.   COPD No wheezing.  DuoNeb as needed.   CKD stage II-IIIa Creatinine currently 1.3 and baseline appears to be 1.0-1.3.  Continue to monitor renal function. 1.4 today, suspect he'll settle with oral diuretic dose, needs outpatient follow up   Medication Nonadherence Reportedly not taking meds at  home as well as illicit drug use Continue education     Procedures: None   Consultations: ENT over phone  Discharge Exam: Vitals:   05/11/23 0415 05/11/23 0751  BP: (!) 152/102 (!) 126/97  Pulse: 72   Resp: 17 20  Temp: 97.9 F (36.6 C) 97.8 F (36.6 C)  SpO2:  97%   No  new complaints Feels ok to discharge, discussed discharge recs He's willing to take meds if we send them  General: No acute distress. Cardiovascular: RRR Lungs: unlabored Neurological: Alert and oriented 3. Moves all extremities 4 with equal strength. Cranial nerves II through XII grossly intact. Extremities: No clubbing or cyanosis. No edema.   Discharge Instructions   Discharge Instructions     Call MD for:  difficulty breathing, headache or visual disturbances   Complete by: As directed    Call MD for:  extreme fatigue   Complete by: As directed    Call MD for:  hives   Complete by: As directed    Call MD for:  persistant dizziness or light-headedness   Complete by: As directed    Call MD for:  persistant nausea and vomiting   Complete by: As directed    Call MD for:  redness, tenderness, or signs of infection (pain, swelling, redness, odor or green/yellow discharge around incision site)   Complete by: As directed    Call MD for:  severe uncontrolled pain   Complete by: As directed    Call MD for:  temperature >100.4   Complete by: As directed    Diet - low sodium heart healthy   Complete by: As directed    Diet - low sodium heart healthy   Complete by: As directed    Discharge instructions   Complete by: As directed    You were seen for Charles [name] heart failure exacerbation.  You've improved with diuresis.  We also restarted your home medicines.  It will be extremely important for you to adhere to Charles [name] low sodium (less than 2 grams/day) diet as well as adhere to your medication regimen.  I've refilled your medicines and sent them to your pharmacy.  You'll need to follow up with your PCP and your cardiologist within Charles [name] week or two.  Your PCP and cardiologists should manage your refills after this hospitalization.  They'll also follow up on your heart failure.  Stopping drug use is extremely important.  Ongoing cocaine use puts you at risk for complications including death.   You  have salivary stones blocking your salivary ducts.  This is causing the pain in your jaw.  I'll send you home with Charles [name] short course of antibiotics, but the solution is going to be follow up with the Ear[facility] N[facility] Throat doctors.  Call them first thing tomorrow for an appointment.  Use hard or sour candies to suck on, these can be helpful for salivary stones.   You had some abnormal heart rhythm seen on telemetry.  This will need to be followed with cardiology outpatient.  Return for new, recurrent, or worsening symptoms.  Please ask your PCP to request records from this hospitalization so they know what was done and what the next steps will be.   Increase activity slowly   Complete by: As directed    Increase activity slowly   Complete by: As directed       Allergies as of 05/11/2023       Reactions   Egg-derived Products Nausea And Vomiting  Banana Nausea And Vomiting   Pork-derived Products Nausea And Vomiting        Medication List     TAKE these medications    albuterol 108 (90 Base) MCG/ACT inhaler Commonly known as: VENTOLIN HFA Inhale 2 puffs into the lungs every 4 (four) hours as needed for wheezing or shortness of breath.   amLODipine 10 MG tablet Commonly known as: NORVASC Take 1 tablet (10 mg total) by mouth daily. Follow up with your PCP or cardiologist for refills. What changed: additional instructions   cefadroxil 500 MG capsule Commonly known as: DURICEF Take 1 capsule (500 mg total) by mouth 2 (two) times daily for 7 days.   cyanocobalamin 1000 MCG tablet Commonly known as: VITAMIN B12 Take 1 tablet (1,000 mcg total) by mouth daily. Follow with your PCP for repeat labs and refills. What changed: additional instructions   empagliflozin 10 MG Tabs tablet Commonly known as: Jardiance Take 1 tablet (10 mg total) by mouth daily before breakfast. You should get repeat labs in 1-2 weeks.  Follow with your PCP or cardiologist for refills. What changed:  additional instructions   furosemide 40 MG tablet Commonly known as: LASIX Take 1 tablet (40 mg total) by mouth daily. Follow up with your PCP or cardiologist for refills.  You should get repeat labs within 1-2 weeks and Florrie [name] follow up appointment. What changed: additional instructions   lidocaine 5 % Commonly known as: LIDODERM Place 1 patch onto the skin daily. Remove & Discard patch within 12 hours or as directed by MD   losartan 25 MG tablet Commonly known as: COZAAR Take 1 tablet (25 mg total) by mouth daily. Follow up with your PCP or cardiologist for refills.  You should get repeat labs within 1-2 weeks. What changed: additional instructions   spironolactone 25 MG tablet Commonly known as: ALDACTONE Take 0.5 tablets (12.5 mg total) by mouth daily. Follow up with your PCP or cardiologist for refills.  You need repeat labs in 1-2 weeks. What changed: additional instructions       Allergies  Allergen Reactions   Egg-Derived Products Nausea And Vomiting   Banana Nausea And Vomiting   Pork-Derived Products Nausea And Vomiting    Follow-up Information     Renaye Rakers, MD Follow up.   Specialty: Family Medicine Contact information: [address], [address] [place] [address] [phone]         Suzanna Obey, MD Follow up.   Specialty: Otolaryngology Why: Call for an ENT follow up Contact information: [address] [place] [address] [phone]                  The results of significant diagnostics from this hospitalization (including imaging, microbiology, ancillary and laboratory) are listed below for reference.    Significant Diagnostic Studies: CT SOFT TISSUE NECK W CONTRAST Result Date: 05/10/2023 CLINICAL DATA:  Right-sided submandibular pain and swelling. EXAM: CT NECK WITH CONTRAST TECHNIQUE: Multidetector CT imaging of the neck was performed using the standard protocol following the bolus administration of intravenous contrast.  RADIATION DOSE REDUCTION: This exam was performed according to the departmental dose-optimization program which includes automated exposure control, adjustment of the mA and/or kV according to patient size and/or use of iterative reconstruction technique. CONTRAST:  75mL OMNIPAQUE IOHEXOL 350 MG/ML SOLN COMPARISON:  CT angio head and neck 06/06/2020 FINDINGS: Pharynx and larynx: No focal mucosal or submucosal lesions are present. The nasopharynx is within normal limits. Soft palate and  tongue base are normal. Vallecula and epiglottis are within normal limits. Aryepiglottic folds and piriform sinuses are clear. Vocal cords are midline and symmetric. Trachea is clear. Salivary glands: Mercy Malena stone within the right submandibular duct continues to increase in size, now measuring 12 x 9 x 20 mm. Charles [name] slightly more anterior stone in the duct measures 6 mm. Enhancing tissue subjacent to the duct likely reflects minor salivary glands. The right submandibular duct is hypodense compared to the left on today's exam. The left submandibular gland and duct are normal. Parotid glands and ducts are normal bilaterally. Thyroid: Normal Lymph nodes: Asymmetric hyperdense right level 2 lymph nodes measure 18 mm anteriorly in 14 mm more posteriorly. These are both ovoid and likely reactive. No rounded or necrotic are present. Vascular: Atherosclerotic calcifications are present at the carotid bifurcations bilaterally. No significant stenosis is present relative to the more distal vessels. Limited intracranial: Within normal limits. Visualized orbits: The globes and orbits are within normal limits. Mastoids and visualized paranasal sinuses: The paranasal sinuses and mastoid air cells are clear. Skeleton: Vertebral body heights and alignment are normal. Multiple dental caries are present. Multiple foci of periapical lucencies are noted in the mandible and maxilla bilaterally without associated focal soft tissue inflammatory change Upper chest:  The lung apices are clear. The thoracic inlet is within normal limits. IMPRESSION: 1. Charles [name] stone within the right submandibular duct continues to increase in size, now measuring 12 x 9 x 20 mm. Charles [name] slightly more anterior stone in the duct measures 6 mm. 2. The right submandibular duct is hypodense compared to the left on today's exam. This likely represents the sequela of chronic obstruction. 3. Enhancing soft tissue adjacent to the right submandibular duct likely represents minor salivary glands and inflammation. 4. Asymmetric hyperdense right level 2 lymph nodes are likely reactive. 5. Multiple dental caries and periapical lucencies in the mandible and maxilla bilaterally without associated focal soft tissue inflammatory change. No focal odontogenic etiology for the patient's symptoms is identified. 6. Atherosclerotic calcifications at the carotid bifurcations bilaterally. No significant stenosis is present relative to the more distal vessels. Electronically Signed   By: Marin Roberts M.D.   On: 05/10/2023 11:44   DG Chest 2 View Result Date: 05/08/2023 CLINICAL DATA:  Shortness of breath and chills.  Fever. EXAM: CHEST - 2 VIEW COMPARISON:  Chest radiograph dated 04/23/2023. FINDINGS: There is cardiomegaly with vascular congestion. Trace bilateral pleural effusions may be present. No focal consolidation or pneumothorax. No acute osseous pathology. Degenerative changes of the spine. IMPRESSION: Cardiomegaly with vascular congestion. Electronically Signed   By: Elgie Collard M.D.   On: 05/08/2023 19:25   DG Chest 2 View Result Date: 04/23/2023 CLINICAL DATA:  Chest pain. EXAM: CHEST - 2 VIEW COMPARISON:  X-ray 04/14/2023 and older. FINDINGS: No consolidation, pneumothorax or effusion. Borderline cardiopericardial silhouette without edema. Slightly tortuous aorta. Presumed embolization material left upper quadrant of the abdomen. Moderate degenerative changes of the spine with large anterior osteophytes  IMPRESSION: Borderline size heart.  Actually decreased in size from previous Electronically Signed   By: Karen Kays M.D.   On: 04/23/2023 19:26   DG Chest Port 1 View Result Date: 04/14/2023 CLINICAL DATA:  Vomiting. EXAM: PORTABLE CHEST 1 VIEW COMPARISON:  02/21/2023 FINDINGS: The cardio pericardial silhouette is enlarged. Vascular congestion noted with probable interstitial pulmonary edema. No focal consolidation. No pneumothorax or pleural effusion. No acute bony abnormality. Telemetry leads overlie the chest. IMPRESSION: Enlargement of the cardiopericardial silhouette with vascular congestion  and probable interstitial pulmonary edema. Electronically Signed   By: Kennith Center M.D.   On: 04/14/2023 04:20    Microbiology: Recent Results (from the past 240 hours)  Blood Culture (routine x 2)     Status: None (Preliminary result)   Collection Time: 05/08/23  9:00 PM   Specimen: BLOOD RIGHT FOREARM  Result Value Ref Range Status   Specimen Description BLOOD RIGHT FOREARM  Final   Special Requests   Final    BOTTLES DRAWN AEROBIC AND ANAEROBIC Blood Culture adequate volume   Culture   Final    NO GROWTH 3 DAYS Performed at [facility], [address] [place], [place] [address]    Report Status PENDING  Incomplete  Blood Culture (routine x 2)     Status: None (Preliminary result)   Collection Time: 05/08/23  9:05 PM   Specimen: BLOOD  Result Value Ref Range Status   Specimen Description BLOOD RIGHT ANTECUBITAL  Final   Special Requests   Final    BOTTLES DRAWN AEROBIC AND ANAEROBIC Blood Culture adequate volume   Culture   Final    NO GROWTH 3 DAYS Performed at [facility], [address], [place], [place] [address]    Report Status PENDING  Incomplete  Resp panel by RT-PCR (RSV, Flu Akon Reinoso&B, Covid) Anterior Nasal Swab     Status: None   Collection Time: 05/08/23  9:09 PM   Specimen: Anterior Nasal Swab  Result Value Ref Range Status   SARS Coronavirus 2 by RT PCR  NEGATIVE NEGATIVE Final   Influenza Kristian Hazzard by PCR NEGATIVE NEGATIVE Final   Influenza B by PCR NEGATIVE NEGATIVE Final    Comment: (NOTE) The Xpert Xpress SARS-CoV-2/FLU/RSV plus assay is intended as an aid in the diagnosis of influenza from Nasopharyngeal swab specimens and should not be used as Soyla Bainter sole basis for treatment. Nasal washings and aspirates are unacceptable for Xpert Xpress SARS-CoV-2/FLU/RSV testing.  Fact Sheet for Patients: BloggerCourse.com  Fact Sheet for Healthcare Providers: SeriousBroker.it  This test is not yet approved or cleared by the [place] FDA and has been authorized for detection and/or diagnosis of SARS-CoV-2 by FDA under an Emergency Use Authorization (EUA). This EUA will remain in effect (meaning this test can be used) for the duration of the COVID-19 declaration under Section 564(b)(1) of the Act, 21 U.S.C. section 360bbb-3(b)(1), unless the authorization is terminated or revoked.     Resp Syncytial Virus by PCR NEGATIVE NEGATIVE Final    Comment: (NOTE) Fact Sheet for Patients: BloggerCourse.com  Fact Sheet for Healthcare Providers: SeriousBroker.it  This test is not yet approved or cleared by the [place] FDA and has been authorized for detection and/or diagnosis of SARS-CoV-2 by FDA under an Emergency Use Authorization (EUA). This EUA will remain in effect (meaning this test can be used) for the duration of the COVID-19 declaration under Section 564(b)(1) of the Act, 21 U.S.C. section 360bbb-3(b)(1), unless the authorization is terminated or revoked.  Performed at [facility], [address], [place], [place] [address]      Labs: Basic Metabolic Panel: Recent Labs  Lab 05/08/23 1853 05/09/23 0500 05/10/23 0214 05/11/23 0157  NA 138 139 136 135  K 4.0 3.8 4.3 4.2  CL 106 105 102 101  CO2 23 29 26 27   GLUCOSE 134* 88  112* 114*  BUN 17 18 19  27*  CREATININE 1.31* 1.37* 1.30* 1.44*  CALCIUM 8.6* 8.8* 8.9 8.6*  MG  --   --  2.1 2.4  PHOS  --   --  3.5 4.5   Liver Function Tests: Recent Labs  Lab 05/10/23 0214 05/11/23 0157  AST 23 22  ALT 20 21  ALKPHOS 75 73  BILITOT 0.7 0.3  PROT 6.6 6.6  ALBUMIN 2.8* 2.8*   No results for input(s): "LIPASE", "AMYLASE" in the last 168 hours. No results for input(s): "AMMONIA" in the last 168 hours. CBC: Recent Labs  Lab 05/08/23 1853 05/10/23 0214 05/11/23 0157  WBC 4.4 3.5* 3.9*  NEUTROABS  --  1.7 1.8  HGB 12.2* 13.6 13.2  HCT 36.4* 40.2 39.1  MCV 104.0* 100.8* 101.3*  PLT 121* 120* 130*   Cardiac Enzymes: No results for input(s): "CKTOTAL", "CKMB", "CKMBINDEX", "TROPONINI" in the last 168 hours. BNP: BNP (last 3 results) Recent Labs    02/22/23 0517 04/14/23 0911 05/08/23 2100  BNP 2,251.3* 1,806.9* 1,836.7*    ProBNP (last 3 results) No results for input(s): "PROBNP" in the last 8760 hours.  CBG: No results for input(s): "GLUCAP" in the last 168 hours.     Signed:  Lacretia Nicks MD.  [facility] 05/11/2023, 3:08 PM

## 2023-05-11 NOTE — Discharge Summary (Incomplete)
Physician Discharge Summary  Charles [name] ZOX:[id number] DOB: 01-13-1971 DOA: 05/08/2023  PCP: Charles Rakers, MD  Admit date: 05/08/2023 Discharge date: 05/11/2023  Time spent: *** minutes  Recommendations for Outpatient Follow-up:  ***  ***   Discharge Diagnoses:  Principal Problem:   Acute on chronic HFrEF (heart failure with reduced ejection fraction) (HCC) Active Problems:   Polysubstance abuse (HCC)   HTN (hypertension)   Chronic hepatitis C (HCC)   Chest pain   Discharge Condition: ***  Diet recommendation: ***  Filed Weights   05/09/23 1131 05/10/23 0604 05/11/23 0415  Weight: 68.6 kg 67.9 kg 68.5 kg    History of present illness:  No notes on file  Hospital Course:  Assessment and Plan: No notes have been filed under this hospital service. Service: Hospitalist      {Tip this will not be part of the note when signed Body mass index is 24.37 kg/m. , ,  (Optional):26781}   Procedures: ***   Consultations: ***  Discharge Exam: Vitals:   05/11/23 0415 05/11/23 0751  BP: (!) 152/102 (!) 126/97  Pulse: 72   Resp: 17 20  Temp: 97.9 F (36.6 C) 97.8 F (36.6 C)  SpO2:  97%    General: *** Cardiovascular: *** Respiratory: ***  Discharge Instructions   Discharge Instructions     Call MD for:  difficulty breathing, headache or visual disturbances   Complete by: As directed    Call MD for:  extreme fatigue   Complete by: As directed    Call MD for:  hives   Complete by: As directed    Call MD for:  persistant dizziness or light-headedness   Complete by: As directed    Call MD for:  persistant nausea and vomiting   Complete by: As directed    Call MD for:  redness, tenderness, or signs of infection (pain, swelling, redness, odor or green/yellow discharge around incision site)   Complete by: As directed    Call MD for:  severe uncontrolled pain   Complete by: As directed    Call MD for:  temperature >100.4   Complete by: As  directed    Diet - low sodium heart healthy   Complete by: As directed    Diet - low sodium heart healthy   Complete by: As directed    Discharge instructions   Complete by: As directed    You were seen for Charles [name] heart failure exacerbation.  You've improved with diuresis.  We also restarted your home medicines.  It will be extremely important for you to adhere to Charles [name] low sodium (less than 2 grams/day) diet as well as adhere to your medication regimen.  I've refilled your medicines and sent them to your pharmacy.  You'll need to follow up with your PCP and your cardiologist within [name] week or two.  Your PCP and cardiologists should manage your refills after this hospitalization.  They'll also follow up on your heart failure.  Stopping drug use is extremely important.  Ongoing cocaine use puts you at risk for complications including death.   You have salivary stones blocking your salivary ducts.  This is causing the pain in your jaw.  I'll send you home with Charles [name] short course of antibiotics, but the solution is going to be follow up with the Ear, Nose, and Throat doctors.  Call them first thing tomorrow for an appointment.  Use hard or sour candies to suck on, these can be helpful for salivary stones.  You had some abnormal heart rhythm seen on telemetry.  This will need to be followed with cardiology outpatient.  Return for new, recurrent, or worsening symptoms.  Please ask your PCP to request records from this hospitalization so they know what was done and what the next steps will be.   Increase activity slowly   Complete by: As directed    Increase activity slowly   Complete by: As directed       Allergies as of 05/11/2023       Reactions   Egg-derived Products Nausea And Vomiting   Banana Nausea And Vomiting   Pork-derived Products Nausea And Vomiting        Medication List     TAKE these medications    albuterol 108 (90 Base) MCG/ACT inhaler Commonly known as: VENTOLIN  HFA Inhale 2 puffs into the lungs every 4 (four) hours as needed for wheezing or shortness of breath.   amLODipine 10 MG tablet Commonly known as: NORVASC Take 1 tablet (10 mg total) by mouth daily. Follow up with your PCP or cardiologist for refills. What changed: additional instructions   cefadroxil 500 MG capsule Commonly known as: DURICEF Take 1 capsule (500 mg total) by mouth 2 (two) times daily for 7 days.   cyanocobalamin 1000 MCG tablet Commonly known as: VITAMIN B12 Take 1 tablet (1,000 mcg total) by mouth daily. Follow with your PCP for repeat labs and refills. What changed: additional instructions   empagliflozin 10 MG Tabs tablet Commonly known as: Jardiance Take 1 tablet (10 mg total) by mouth daily before breakfast. You should get repeat labs in 1-2 weeks.  Follow with your PCP or cardiologist for refills. What changed: additional instructions   furosemide 40 MG tablet Commonly known as: LASIX Take 1 tablet (40 mg total) by mouth daily. Follow up with your PCP or cardiologist for refills.  You should get repeat labs within 1-2 weeks and Charles [name] follow up appointment. What changed: additional instructions   lidocaine 5 % Commonly known as: LIDODERM Place 1 patch onto the skin daily. Remove & Discard patch within 12 hours or as directed by MD   losartan 25 MG tablet Commonly known as: COZAAR Take 1 tablet (25 mg total) by mouth daily. Follow up with your PCP or cardiologist for refills.  You should get repeat labs within 1-2 weeks. What changed: additional instructions   spironolactone 25 MG tablet Commonly known as: ALDACTONE Take 0.5 tablets (12.5 mg total) by mouth daily. Follow up with your PCP or cardiologist for refills.  You need repeat labs in 1-2 weeks. What changed: additional instructions       Allergies  Allergen Reactions   Egg-Derived Products Nausea And Vomiting   Banana Nausea And Vomiting   Pork-Derived Products Nausea And Vomiting    Follow-up  Information     Charles Rakers, MD Follow up.   Specialty: Family Medicine Contact information: [address] [place] [address] [phone]         Charles Obey, MD Follow up.   Specialty: Otolaryngology Why: Call for an ENT follow up Contact information: [address] [place] [address] [phone]                  The results of significant diagnostics from this hospitalization (including imaging, microbiology, ancillary and laboratory) are listed below for reference.    Significant Diagnostic Studies: CT SOFT TISSUE NECK W CONTRAST Result Date: 05/10/2023 CLINICAL DATA:  Right-sided  submandibular pain and swelling. EXAM: CT NECK WITH CONTRAST TECHNIQUE: Multidetector CT imaging of the neck was performed using the standard protocol following the bolus administration of intravenous contrast. RADIATION DOSE REDUCTION: This exam was performed according to the departmental dose-optimization program which includes automated exposure control, adjustment of the mA and/or kV according to patient size and/or use of iterative reconstruction technique. CONTRAST:  75mL OMNIPAQUE IOHEXOL 350 MG/ML SOLN COMPARISON:  CT angio head and neck 06/06/2020 FINDINGS: Pharynx and larynx: No focal mucosal or submucosal lesions are present. The nasopharynx is within normal limits. Soft palate and tongue base are normal. Vallecula and epiglottis are within normal limits. Aryepiglottic folds and piriform sinuses are clear. Vocal cords are midline and symmetric. Trachea is clear. Salivary glands: Quasim Doyon stone within the right submandibular duct continues to increase in size, now measuring 12 x 9 x 20 mm. Jansel Vonstein slightly more anterior stone in the duct measures 6 mm. Enhancing tissue subjacent to the duct likely reflects minor salivary glands. The right submandibular duct is hypodense compared to the left on today's exam. The left submandibular gland and duct are normal. Parotid glands and ducts  are normal bilaterally. Thyroid: Normal Lymph nodes: Asymmetric hyperdense right level 2 lymph nodes measure 18 mm anteriorly in 14 mm more posteriorly. These are both ovoid and likely reactive. No rounded or necrotic are present. Vascular: Atherosclerotic calcifications are present at the carotid bifurcations bilaterally. No significant stenosis is present relative to the more distal vessels. Limited intracranial: Within normal limits. Visualized orbits: The globes and orbits are within normal limits. Mastoids and visualized paranasal sinuses: The paranasal sinuses and mastoid air cells are clear. Skeleton: Vertebral body heights and alignment are normal. Multiple dental caries are present. Multiple foci of periapical lucencies are noted in the mandible and maxilla bilaterally without associated focal soft tissue inflammatory change Upper chest: The lung apices are clear. The thoracic inlet is within normal limits. IMPRESSION: 1. Remus Hagedorn stone within the right submandibular duct continues to increase in size, now measuring 12 x 9 x 20 mm. Euva Rundell slightly more anterior stone in the duct measures 6 mm. 2. The right submandibular duct is hypodense compared to the left on today's exam. This likely represents the sequela of chronic obstruction. 3. Enhancing soft tissue adjacent to the right submandibular duct likely represents minor salivary glands and inflammation. 4. Asymmetric hyperdense right level 2 lymph nodes are likely reactive. 5. Multiple dental caries and periapical lucencies in the mandible and maxilla bilaterally without associated focal soft tissue inflammatory change. No focal odontogenic etiology for the patient's symptoms is identified. 6. Atherosclerotic calcifications at the carotid bifurcations bilaterally. No significant stenosis is present relative to the more distal vessels. Electronically Signed   By: Marin Roberts M.D.   On: 05/10/2023 11:44   DG Chest 2 View Result Date: 05/08/2023 CLINICAL  DATA:  Shortness of breath and chills.  Fever. EXAM: CHEST - 2 VIEW COMPARISON:  Chest radiograph dated 04/23/2023. FINDINGS: There is cardiomegaly with vascular congestion. Trace bilateral pleural effusions may be present. No focal consolidation or pneumothorax. No acute osseous pathology. Degenerative changes of the spine. IMPRESSION: Cardiomegaly with vascular congestion. Electronically Signed   By: Elgie Collard M.D.   On: 05/08/2023 19:25   DG Chest 2 View Result Date: 04/23/2023 CLINICAL DATA:  Chest pain. EXAM: CHEST - 2 VIEW COMPARISON:  X-ray 04/14/2023 and older. FINDINGS: No consolidation, pneumothorax or effusion. Borderline cardiopericardial silhouette without edema. Slightly tortuous aorta. Presumed embolization material left upper quadrant of  the abdomen. Moderate degenerative changes of the spine with large anterior osteophytes IMPRESSION: Borderline size heart.  Actually decreased in size from previous Electronically Signed   By: Karen Kays M.D.   On: 04/23/2023 19:26   DG Chest Port 1 View Result Date: 04/14/2023 CLINICAL DATA:  Vomiting. EXAM: PORTABLE CHEST 1 VIEW COMPARISON:  02/21/2023 FINDINGS: The cardio pericardial silhouette is enlarged. Vascular congestion noted with probable interstitial pulmonary edema. No focal consolidation. No pneumothorax or pleural effusion. No acute bony abnormality. Telemetry leads overlie the chest. IMPRESSION: Enlargement of the cardiopericardial silhouette with vascular congestion and probable interstitial pulmonary edema. Electronically Signed   By: Kennith Center M.D.   On: 04/14/2023 04:20    Microbiology: Recent Results (from the past 240 hours)  Blood Culture (routine x 2)     Status: None (Preliminary result)   Collection Time: 05/08/23  9:00 PM   Specimen: BLOOD RIGHT FOREARM  Result Value Ref Range Status   Specimen Description BLOOD RIGHT FOREARM  Final   Special Requests   Final    BOTTLES DRAWN AEROBIC AND ANAEROBIC Blood  Culture adequate volume   Culture   Final    NO GROWTH 3 DAYS Performed at [facility], [address], [place], [place] [address]    Report Status PENDING  Incomplete  Blood Culture (routine x 2)     Status: None (Preliminary result)   Collection Time: 05/08/23  9:05 PM   Specimen: BLOOD  Result Value Ref Range Status   Specimen Description BLOOD RIGHT ANTECUBITAL  Final   Special Requests   Final    BOTTLES DRAWN AEROBIC AND ANAEROBIC Blood Culture adequate volume   Culture   Final    NO GROWTH 3 DAYS Performed at [facility], [address], [place], [place] [address]    Report Status PENDING  Incomplete  Resp panel by RT-PCR (RSV, Flu Damarri Rampy&B, Covid) Anterior Nasal Swab     Status: None   Collection Time: 05/08/23  9:09 PM   Specimen: Anterior Nasal Swab  Result Value Ref Range Status   SARS Coronavirus 2 by RT PCR NEGATIVE NEGATIVE Final   Influenza Yahira Timberman by PCR NEGATIVE NEGATIVE Final   Influenza B by PCR NEGATIVE NEGATIVE Final    Comment: (NOTE) The Xpert Xpress SARS-CoV-2/FLU/RSV plus assay is intended as an aid in the diagnosis of influenza from Nasopharyngeal swab specimens and should not be used as Yenifer Saccente sole basis for treatment. Nasal washings and aspirates are unacceptable for Xpert Xpress SARS-CoV-2/FLU/RSV testing.  Fact Sheet for Patients: BloggerCourse.com  Fact Sheet for Healthcare Providers: SeriousBroker.it  This test is not yet approved or cleared by the [place] FDA and has been authorized for detection and/or diagnosis of SARS-CoV-2 by FDA under an Emergency Use Authorization (EUA). This EUA will remain in effect (meaning this test can be used) for the duration of the COVID-19 declaration under Section 564(b)(1) of the Act, 21 U.S.C. section 360bbb-3(b)(1), unless the authorization is terminated or revoked.     Resp Syncytial Virus by PCR NEGATIVE NEGATIVE Final    Comment: (NOTE) Fact  Sheet for Patients: BloggerCourse.com  Fact Sheet for Healthcare Providers: SeriousBroker.it  This test is not yet approved or cleared by the [place] FDA and has been authorized for detection and/or diagnosis of SARS-CoV-2 by FDA under an Emergency Use Authorization (EUA). This EUA will remain in effect (meaning this test can be used) for the duration of the COVID-19 declaration under Section 564(b)(1)  of the Act, 21 U.S.C. section 360bbb-3(b)(1), unless the authorization is terminated or revoked.  Performed at [facility], [address], [place], [place] [address]      Labs: Basic Metabolic Panel: Recent Labs  Lab 05/08/23 1853 05/09/23 0500 05/10/23 0214 05/11/23 0157  NA 138 139 136 135  K 4.0 3.8 4.3 4.2  CL 106 105 102 101  CO2 23 29 26 27   GLUCOSE 134* 88 112* 114*  BUN 17 18 19  27*  CREATININE 1.31* 1.37* 1.30* 1.44*  CALCIUM 8.6* 8.8* 8.9 8.6*  MG  --   --  2.1 2.4  PHOS  --   --  3.5 4.5   Liver Function Tests: Recent Labs  Lab 05/10/23 0214 05/11/23 0157  AST 23 22  ALT 20 21  ALKPHOS 75 73  BILITOT 0.7 0.3  PROT 6.6 6.6  ALBUMIN 2.8* 2.8*   No results for input(s): "LIPASE", "AMYLASE" in the last 168 hours. No results for input(s): "AMMONIA" in the last 168 hours. CBC: Recent Labs  Lab 05/08/23 1853 05/10/23 0214 05/11/23 0157  WBC 4.4 3.5* 3.9*  NEUTROABS  --  1.7 1.8  HGB 12.2* 13.6 13.2  HCT 36.4* 40.2 39.1  MCV 104.0* 100.8* 101.3*  PLT 121* 120* 130*   Cardiac Enzymes: No results for input(s): "CKTOTAL", "CKMB", "CKMBINDEX", "TROPONINI" in the last 168 hours. BNP: BNP (last 3 results) Recent Labs    02/22/23 0517 04/14/23 0911 05/08/23 2100  BNP 2,251.3* 1,806.9* 1,836.7*    ProBNP (last 3 results) No results for input(s): "PROBNP" in the last 8760 hours.  CBG: No results for input(s): "GLUCAP" in the last 168 hours.     Signed:  Lacretia Nicks MD.   [facility] 05/11/2023, 1:51 PM

## 2023-05-13 LAB — CULTURE, BLOOD (ROUTINE X 2)
Culture: NO GROWTH
Culture: NO GROWTH
Special Requests: ADEQUATE
Special Requests: ADEQUATE

## 2023-05-29 ENCOUNTER — Encounter (HOSPITAL_COMMUNITY): Payer: Self-pay | Admitting: Internal Medicine

## 2023-05-29 ENCOUNTER — Inpatient Hospital Stay (HOSPITAL_COMMUNITY)
Admission: EM | Admit: 2023-05-29 | Discharge: 2023-06-04 | DRG: 291 | Disposition: A | Discharge status: Home / Self Care | Payer: Medicare HMO | Attending: Internal Medicine | Admitting: Internal Medicine

## 2023-05-29 ENCOUNTER — Emergency Department (HOSPITAL_COMMUNITY): Payer: Medicare HMO

## 2023-05-29 ENCOUNTER — Other Ambulatory Visit: Payer: Self-pay

## 2023-05-29 DIAGNOSIS — I110 Hypertensive heart disease with heart failure: Secondary | ICD-10-CM | POA: Diagnosis not present

## 2023-05-29 DIAGNOSIS — R062 Wheezing: Secondary | ICD-10-CM | POA: Diagnosis not present

## 2023-05-29 DIAGNOSIS — I10 Essential (primary) hypertension: Secondary | ICD-10-CM | POA: Diagnosis not present

## 2023-05-29 DIAGNOSIS — Z91018 Allergy to other foods: Secondary | ICD-10-CM | POA: Diagnosis not present

## 2023-05-29 DIAGNOSIS — Z91128 Patient's intentional underdosing of medication regimen for other reason: Secondary | ICD-10-CM

## 2023-05-29 DIAGNOSIS — I517 Cardiomegaly: Secondary | ICD-10-CM | POA: Diagnosis not present

## 2023-05-29 DIAGNOSIS — Z91014 Allergy to mammalian meats: Secondary | ICD-10-CM

## 2023-05-29 DIAGNOSIS — F1910 Other psychoactive substance abuse, uncomplicated: Secondary | ICD-10-CM | POA: Diagnosis present

## 2023-05-29 DIAGNOSIS — N179 Acute kidney failure, unspecified: Secondary | ICD-10-CM | POA: Diagnosis not present

## 2023-05-29 DIAGNOSIS — R0989 Other specified symptoms and signs involving the circulatory and respiratory systems: Secondary | ICD-10-CM | POA: Diagnosis not present

## 2023-05-29 DIAGNOSIS — D61818 Other pancytopenia: Secondary | ICD-10-CM | POA: Diagnosis not present

## 2023-05-29 DIAGNOSIS — B182 Chronic viral hepatitis C: Secondary | ICD-10-CM | POA: Diagnosis present

## 2023-05-29 DIAGNOSIS — Z801 Family history of malignant neoplasm of trachea, bronchus and lung: Secondary | ICD-10-CM

## 2023-05-29 DIAGNOSIS — Z5901 Sheltered homelessness: Secondary | ICD-10-CM

## 2023-05-29 DIAGNOSIS — F1410 Cocaine abuse, uncomplicated: Secondary | ICD-10-CM | POA: Diagnosis present

## 2023-05-29 DIAGNOSIS — M19072 Primary osteoarthritis, left ankle and foot: Secondary | ICD-10-CM | POA: Diagnosis present

## 2023-05-29 DIAGNOSIS — R0682 Tachypnea, not elsewhere classified: Secondary | ICD-10-CM | POA: Diagnosis not present

## 2023-05-29 DIAGNOSIS — J449 Chronic obstructive pulmonary disease, unspecified: Secondary | ICD-10-CM | POA: Diagnosis present

## 2023-05-29 DIAGNOSIS — F17210 Nicotine dependence, cigarettes, uncomplicated: Secondary | ICD-10-CM | POA: Diagnosis present

## 2023-05-29 DIAGNOSIS — R0689 Other abnormalities of breathing: Secondary | ICD-10-CM | POA: Diagnosis not present

## 2023-05-29 DIAGNOSIS — Z91012 Allergy to eggs: Secondary | ICD-10-CM

## 2023-05-29 DIAGNOSIS — I2489 Other forms of acute ischemic heart disease: Secondary | ICD-10-CM | POA: Diagnosis present

## 2023-05-29 DIAGNOSIS — R069 Unspecified abnormalities of breathing: Secondary | ICD-10-CM | POA: Diagnosis not present

## 2023-05-29 DIAGNOSIS — I130 Hypertensive heart and chronic kidney disease with heart failure and stage 1 through stage 4 chronic kidney disease, or unspecified chronic kidney disease: Principal | ICD-10-CM | POA: Diagnosis present

## 2023-05-29 DIAGNOSIS — R0602 Shortness of breath: Secondary | ICD-10-CM | POA: Diagnosis not present

## 2023-05-29 DIAGNOSIS — Z79899 Other long term (current) drug therapy: Secondary | ICD-10-CM | POA: Diagnosis not present

## 2023-05-29 DIAGNOSIS — N1831 Chronic kidney disease, stage 3a: Secondary | ICD-10-CM | POA: Diagnosis present

## 2023-05-29 DIAGNOSIS — I5023 Acute on chronic systolic (congestive) heart failure: Secondary | ICD-10-CM | POA: Diagnosis not present

## 2023-05-29 DIAGNOSIS — Z7151 Drug abuse counseling and surveillance of drug abuser: Secondary | ICD-10-CM | POA: Diagnosis not present

## 2023-05-29 DIAGNOSIS — I509 Heart failure, unspecified: Principal | ICD-10-CM

## 2023-05-29 DIAGNOSIS — Z7984 Long term (current) use of oral hypoglycemic drugs: Secondary | ICD-10-CM | POA: Diagnosis not present

## 2023-05-29 LAB — COMPREHENSIVE METABOLIC PANEL
ALT: 38 U/L (ref 0–44)
AST: 36 U/L (ref 15–41)
Albumin: 2.9 g/dL — ABNORMAL LOW (ref 3.5–5.0)
Alkaline Phosphatase: 66 U/L (ref 38–126)
Anion gap: 6 (ref 5–15)
BUN: 23 mg/dL — ABNORMAL HIGH (ref 6–20)
CO2: 21 mmol/L — ABNORMAL LOW (ref 22–32)
Calcium: 8.7 mg/dL — ABNORMAL LOW (ref 8.9–10.3)
Chloride: 112 mmol/L — ABNORMAL HIGH (ref 98–111)
Creatinine, Ser: 1.37 mg/dL — ABNORMAL HIGH (ref 0.61–1.24)
GFR, Estimated: >60 mL/min (ref >60)
Glucose, Bld: 94 mg/dL (ref 70–99)
Potassium: 4.3 mmol/L (ref 3.5–5.1)
Sodium: 139 mmol/L (ref 135–145)
Total Bilirubin: 1.3 mg/dL — ABNORMAL HIGH (ref 0.0–1.2)
Total Protein: 6.2 g/dL — ABNORMAL LOW (ref 6.5–8.1)

## 2023-05-29 LAB — CBC WITH DIFFERENTIAL/PLATELET
Abs Immature Granulocytes: 0.01 10*3/uL (ref 0.00–0.07)
Basophils Absolute: 0 10*3/uL (ref 0.0–0.1)
Basophils Relative: 0 %
Eosinophils Absolute: 0 10*3/uL (ref 0.0–0.5)
Eosinophils Relative: 0 %
HCT: 36 % — ABNORMAL LOW (ref 39.0–52.0)
Hemoglobin: 12.1 g/dL — ABNORMAL LOW (ref 13.0–17.0)
Immature Granulocytes: 0 %
Lymphocytes Relative: 18 %
Lymphs Abs: 0.7 10*3/uL (ref 0.7–4.0)
MCH: 34.7 pg — ABNORMAL HIGH (ref 26.0–34.0)
MCHC: 33.6 g/dL (ref 30.0–36.0)
MCV: 103.2 fL — ABNORMAL HIGH (ref 80.0–100.0)
Monocytes Absolute: 0.5 10*3/uL (ref 0.1–1.0)
Monocytes Relative: 12 %
Neutro Abs: 2.6 10*3/uL (ref 1.7–7.7)
Neutrophils Relative %: 70 %
Platelets: 115 10*3/uL — ABNORMAL LOW (ref 150–400)
RBC: 3.49 MIL/uL — ABNORMAL LOW (ref 4.22–5.81)
RDW: 18.6 % — ABNORMAL HIGH (ref 11.5–15.5)
WBC: 3.8 10*3/uL — ABNORMAL LOW (ref 4.0–10.5)
nRBC: 0 % (ref 0.0–0.2)

## 2023-05-29 LAB — RAPID URINE DRUG SCREEN, HOSP PERFORMED
Amphetamines: NOT DETECTED
Barbiturates: NOT DETECTED
Benzodiazepines: NOT DETECTED
Cocaine: POSITIVE — AB
Opiates: NOT DETECTED
Tetrahydrocannabinol: NOT DETECTED

## 2023-05-29 LAB — TROPONIN I (HIGH SENSITIVITY)
Troponin I (High Sensitivity): 90 ng/L — ABNORMAL HIGH (ref <18)
Troponin I (High Sensitivity): 71 ng/L — ABNORMAL HIGH (ref <18)

## 2023-05-29 LAB — BRAIN NATRIURETIC PEPTIDE: B Natriuretic Peptide: 2683.3 pg/mL — ABNORMAL HIGH (ref 0.0–100.0)

## 2023-05-29 MED ORDER — LOSARTAN POTASSIUM 50 MG PO TABS
25.0000 mg | ORAL_TABLET | Freq: Every day | ORAL | Status: DC
  Administered 2023-05-29 – 2023-05-30 (×2): 25 mg via ORAL
  Filled 2023-05-29 (×3): qty 1

## 2023-05-29 MED ORDER — HYDRALAZINE HCL 20 MG/ML IJ SOLN
10.0000 mg | Freq: Four times a day (QID) | INTRAMUSCULAR | Status: DC | PRN
  Administered 2023-05-29: 10 mg via INTRAVENOUS
  Filled 2023-05-29: qty 1

## 2023-05-29 MED ORDER — BISACODYL 5 MG PO TBEC: 5.0000 mg | DELAYED_RELEASE_TABLET | Freq: Every day | ORAL | Status: DC | PRN

## 2023-05-29 MED ORDER — FUROSEMIDE 10 MG/ML IJ SOLN
20.0000 mg | Freq: Once | INTRAMUSCULAR | Status: AC
  Administered 2023-05-29: 20 mg via INTRAVENOUS
  Filled 2023-05-29: qty 2

## 2023-05-29 MED ORDER — FUROSEMIDE 10 MG/ML IJ SOLN
40.0000 mg | Freq: Two times a day (BID) | INTRAMUSCULAR | Status: DC
  Administered 2023-05-30 – 2023-05-31 (×4): 40 mg via INTRAVENOUS
  Filled 2023-05-29 (×5): qty 4

## 2023-05-29 MED ORDER — SPIRONOLACTONE 12.5 MG HALF TABLET
12.5000 mg | ORAL_TABLET | Freq: Every day | ORAL | Status: DC
  Administered 2023-05-30: 12.5 mg via ORAL
  Filled 2023-05-29: qty 1

## 2023-05-29 MED ORDER — EMPAGLIFLOZIN 10 MG PO TABS
10.0000 mg | ORAL_TABLET | Freq: Every day | ORAL | Status: DC
  Administered 2023-05-30 – 2023-06-04 (×6): 10 mg via ORAL
  Filled 2023-05-29 (×6): qty 1

## 2023-05-29 MED ORDER — ACETAMINOPHEN 325 MG PO TABS
650.0000 mg | ORAL_TABLET | Freq: Four times a day (QID) | ORAL | Status: DC | PRN
  Administered 2023-05-29 – 2023-06-03 (×6): 650 mg via ORAL
  Filled 2023-05-29 (×6): qty 2

## 2023-05-29 MED ORDER — ALBUTEROL SULFATE (2.5 MG/3ML) 0.083% IN NEBU
2.5000 mg | INHALATION_SOLUTION | Freq: Four times a day (QID) | RESPIRATORY_TRACT | Status: DC | PRN
Start: 2023-05-29 — End: 2023-06-04

## 2023-05-29 MED ORDER — AMLODIPINE BESYLATE 5 MG PO TABS
5.0000 mg | ORAL_TABLET | Freq: Every day | ORAL | Status: DC
  Administered 2023-05-29 – 2023-06-03 (×6): 5 mg via ORAL
  Filled 2023-05-29 (×6): qty 1

## 2023-05-29 MED ORDER — POLYETHYLENE GLYCOL 3350 17 G PO PACK: 17.0000 g | PACK | Freq: Every day | ORAL | Status: DC | PRN

## 2023-05-29 MED ORDER — ACETAMINOPHEN 650 MG RE SUPP: 650.0000 mg | Freq: Four times a day (QID) | RECTAL | Status: DC | PRN

## 2023-05-29 NOTE — ED Provider Notes (Signed)
[place] EMERGENCY DEPARTMENT AT [facility] Provider Note   CSN: [id number] Arrival date & time: 05/29/23  1013     History  Chief Complaint  Patient presents with   Shortness of Breath   Cough   Nausea    Charles [name] is a 53 y.o. male.  Patient complains of shortness of breath.  Has a history of congestive heart failure.  Patient states that he does not take his medications.  Patient states he is not going to take his medications.  Patient states that he just comes to the hospital when he is short of breath and needs treatment.  Patient reports he has had nausea and vomiting for the last 24 hours.  Patient received Zofran by EMS and he now reports being hungry and thirsty.  The history is provided by the patient. No language interpreter was used.  Shortness of Breath Associated symptoms: cough   Cough Associated symptoms: shortness of breath        Home Medications Prior to Admission medications   Medication Sig Start Date End Date Taking? Authorizing Provider  albuterol (VENTOLIN HFA) 108 (90 Base) MCG/ACT inhaler Inhale 2 puffs into the lungs every 4 (four) hours as needed for wheezing or shortness of breath. Patient not taking: Reported on 05/29/2023 05/11/23   Zigmund Daniel., MD  amLODipine (NORVASC) 10 MG tablet Take 1 tablet (10 mg total) by mouth daily. Follow up with your PCP or cardiologist for refills. Patient not taking: Reported on 05/29/2023 05/11/23 08/09/23  Zigmund Daniel., MD  cyanocobalamin (VITAMIN B12) 1000 MCG tablet Take 1 tablet (1,000 mcg total) by mouth daily. Follow with your PCP for repeat labs and refills. Patient not taking: Reported on 05/29/2023 05/11/23 06/10/23  Zigmund Daniel., MD  empagliflozin (JARDIANCE) 10 MG TABS tablet Take 1 tablet (10 mg total) by mouth daily before breakfast. You should get repeat labs in 1-2 weeks.  Follow with your PCP or cardiologist for refills. Patient not taking: Reported on  05/29/2023 05/11/23 08/09/23  Zigmund Daniel., MD  furosemide (LASIX) 40 MG tablet Take 1 tablet (40 mg total) by mouth daily. Follow up with your PCP or cardiologist for refills.  You should get repeat labs within 1-2 weeks and a follow up appointment. Patient not taking: Reported on 05/29/2023 05/11/23 08/09/23  Zigmund Daniel., MD  lidocaine (LIDODERM) 5 % Place 1 patch onto the skin daily. Remove & Discard patch within 12 hours or as directed by MD Patient not taking: Reported on 05/29/2023 04/19/23   Briant Cedar, MD  losartan (COZAAR) 25 MG tablet Take 1 tablet (25 mg total) by mouth daily. Follow up with your PCP or cardiologist for refills.  You should get repeat labs within 1-2 weeks. Patient not taking: Reported on 05/29/2023 05/11/23 08/09/23  Zigmund Daniel., MD  spironolactone (ALDACTONE) 25 MG tablet Take 0.5 tablets (12.5 mg total) by mouth daily. Follow up with your PCP or cardiologist for refills.  You need repeat labs in 1-2 weeks. Patient not taking: Reported on 05/29/2023 05/11/23 08/09/23  Zigmund Daniel., MD      Allergies    Egg-derived products, Banana, and Pork-derived products    Review of Systems   Review of Systems  Respiratory:  Positive for cough and shortness of breath.   All other systems reviewed and are negative.   Physical Exam Updated Vital Signs BP (!) 153/107   Pulse 100  Temp 97.6 F (36.4 C) (Oral)   Resp (!) 21   Ht 5\' 6"  (1.676 m)   Wt 69.9 kg   SpO2 100%   BMI 24.86 kg/m  Physical Exam Vitals and nursing note reviewed.  Constitutional:      General: He is not in acute distress.    Appearance: He is well-developed.  HENT:     Head: Normocephalic and atraumatic.  Eyes:     Conjunctiva/sclera: Conjunctivae normal.  Cardiovascular:     Rate and Rhythm: Normal rate and regular rhythm.     Heart sounds: No murmur heard. Pulmonary:     Effort: Pulmonary effort is normal. No respiratory distress.     Breath  sounds: Normal breath sounds. No decreased breath sounds.  Abdominal:     Palpations: Abdomen is soft.     Tenderness: There is no abdominal tenderness.  Musculoskeletal:        General: No swelling.     Cervical back: Neck supple.  Skin:    General: Skin is warm and dry.     Capillary Refill: Capillary refill takes less than 2 seconds.  Neurological:     Mental Status: He is alert.  Psychiatric:        Mood and Affect: Mood normal.     ED Results / Procedures / Treatments   Labs (all labs ordered are listed, but only abnormal results are displayed) Labs Reviewed  CBC WITH DIFFERENTIAL/PLATELET - Abnormal; Notable for the following components:      Result Value   WBC 3.8 (*)    RBC 3.49 (*)    Hemoglobin 12.1 (*)    HCT 36.0 (*)    MCV 103.2 (*)    MCH 34.7 (*)    RDW 18.6 (*)    Platelets 115 (*)    All other components within normal limits  COMPREHENSIVE METABOLIC PANEL - Abnormal; Notable for the following components:   Chloride 112 (*)    CO2 21 (*)    BUN 23 (*)    Creatinine, Ser 1.37 (*)    Calcium 8.7 (*)    Total Protein 6.2 (*)    Albumin 2.9 (*)    Total Bilirubin 1.3 (*)    All other components within normal limits  BRAIN NATRIURETIC PEPTIDE - Abnormal; Notable for the following components:   B Natriuretic Peptide 2,683.3 (*)    All other components within normal limits  TROPONIN I (HIGH SENSITIVITY) - Abnormal; Notable for the following components:   Troponin I (High Sensitivity) 90 (*)    All other components within normal limits  TROPONIN I (HIGH SENSITIVITY)    EKG EKG Interpretation Date/Time:  Thursday May 29 2023 10:33:15 EST Ventricular Rate:  88 PR Interval:  161 QRS Duration:  73 QT Interval:  405 QTC Calculation: 490 R Axis:   -29  Text Interpretation: Sinus rhythm Probable left atrial enlargement LVH with secondary repolarization abnormality Inferior infarct, old Anterior ST elevation, probably due to LVH when compared to prior,  similar appearance. NO STEMI Confirmed by Theda Belfast ([id number]) on 05/29/2023 10:36:02 AM  Radiology DG Chest Port 1 View Result Date: 05/29/2023 CLINICAL DATA:  Shortness of breath. EXAM: PORTABLE CHEST 1 VIEW COMPARISON:  Chest radiograph dated May 08, 2023. FINDINGS: Cardiomegaly with pulmonary vascular congestion. No overt pulmonary edema. No focal consolidation, sizeable pleural effusion, or pneumothorax. No acute osseous abnormality. IMPRESSION: Cardiomegaly with pulmonary vascular congestion. No focal consolidation. Electronically Signed   By: Maryan Char.D.  On: 05/29/2023 11:52    Procedures Procedures    Medications Ordered in ED Medications  furosemide (LASIX) injection 20 mg (20 mg Intravenous Given 05/29/23 1210)    ED Course/ Medical Decision Making/ A&P                                 Medical Decision Making Patient complains of shortness of breath.  He reports he had nausea and vomiting earlier.  Patient is now requesting something to eat and drink.  Amount and/or Complexity of Data Reviewed External Data Reviewed: notes.    Details: Hospitalist  previous notes reviewed he has a history of heart failure chronic kidney disease hypertension Labs: ordered. Decision-making details documented in ED Course.    Details: Reviewed and interpreted troponin is 90 BNP is 2683.  Creatinine is slightly elevated at 1.37 Radiology: ordered and independent interpretation performed. Decision-making details documented in ED Course.    Details: X-ray shows cardiomegaly with pulmonary vascular congestion. ECG/medicine tests: ordered and independent interpretation performed. Decision-making details documented in ED Course.    Details: EKG normal sinus no acute changes Discussion of management or test interpretation with external provider(s): Annette Stable is consulted and will admit for diuresis.  Risk Prescription drug management. Decision regarding hospitalization.   Counseled  on his results.  Patient has had 1500 cc of urine output.  Cussed with patient the fact that he needs to take his home medicines.  Patient states he is not going to take medicines outside of the hospital.  States that he does not believe in medicines.  He states he has told his family that he is going to die. He tells me he will take medicines if he is in the hospital.        Final Clinical Impression(s) / ED Diagnoses Final diagnoses:  Congestive heart failure, unspecified HF chronicity, unspecified heart failure type [facility])    Rx / DC Orders ED Discharge Orders     None         Elson Areas, [place] 05/29/23 1403    Tegeler, Canary Brim, MD 05/29/23 [phone]
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
## 2023-05-29 NOTE — ED Triage Notes (Signed)
Patient [place] from [place] he is staying with a friend with shortness of breath, cough, and congestion for one week; patient states gradually worsening especially with exertion. Patient reports nausea and vomiting x2 days with one episode of vomiting in the last 24 hours. Patient received 4mg  IV zofran from EMS en route. Patient is AAOx4; speaking in clear, full sentences upon arrival.

## 2023-05-30 DIAGNOSIS — I5023 Acute on chronic systolic (congestive) heart failure: Secondary | ICD-10-CM | POA: Diagnosis not present

## 2023-05-30 DIAGNOSIS — I509 Heart failure, unspecified: Secondary | ICD-10-CM | POA: Diagnosis not present

## 2023-05-30 LAB — BASIC METABOLIC PANEL
Anion gap: 7 (ref 5–15)
BUN: 22 mg/dL — ABNORMAL HIGH (ref 6–20)
CO2: 26 mmol/L (ref 22–32)
Calcium: 8.4 mg/dL — ABNORMAL LOW (ref 8.9–10.3)
Chloride: 106 mmol/L (ref 98–111)
Creatinine, Ser: 1.37 mg/dL — ABNORMAL HIGH (ref 0.61–1.24)
GFR, Estimated: >60 mL/min (ref >60)
Glucose, Bld: 110 mg/dL — ABNORMAL HIGH (ref 70–99)
Potassium: 3.8 mmol/L (ref 3.5–5.1)
Sodium: 139 mmol/L (ref 135–145)

## 2023-05-30 LAB — CBC
HCT: 34.8 % — ABNORMAL LOW (ref 39.0–52.0)
Hemoglobin: 11.7 g/dL — ABNORMAL LOW (ref 13.0–17.0)
MCH: 34.5 pg — ABNORMAL HIGH (ref 26.0–34.0)
MCHC: 33.6 g/dL (ref 30.0–36.0)
MCV: 102.7 fL — ABNORMAL HIGH (ref 80.0–100.0)
Platelets: 123 10*3/uL — ABNORMAL LOW (ref 150–400)
RBC: 3.39 MIL/uL — ABNORMAL LOW (ref 4.22–5.81)
RDW: 18.5 % — ABNORMAL HIGH (ref 11.5–15.5)
WBC: 3.3 10*3/uL — ABNORMAL LOW (ref 4.0–10.5)
nRBC: 0 % (ref 0.0–0.2)

## 2023-05-30 LAB — MAGNESIUM: Magnesium: 1.9 mg/dL (ref 1.7–2.4)

## 2023-05-30 LAB — PHOSPHORUS: Phosphorus: 4.1 mg/dL (ref 2.5–4.6)

## 2023-05-30 LAB — BRAIN NATRIURETIC PEPTIDE: B Natriuretic Peptide: 1451.9 pg/mL — ABNORMAL HIGH (ref 0.0–100.0)

## 2023-05-30 MED ORDER — SPIRONOLACTONE 25 MG PO TABS
25.0000 mg | ORAL_TABLET | Freq: Every day | ORAL | Status: DC
  Administered 2023-05-31 – 2023-06-04 (×4): 25 mg via ORAL
  Filled 2023-05-30 (×5): qty 1

## 2023-05-30 MED ORDER — RIVAROXABAN 10 MG PO TABS
10.0000 mg | ORAL_TABLET | Freq: Every day | ORAL | Status: DC
  Administered 2023-05-30 – 2023-06-04 (×6): 10 mg via ORAL
  Filled 2023-05-30 (×6): qty 1

## 2023-05-30 NOTE — Progress Notes (Signed)
CSW met pt at bedside and introduced self/role. Pt stated he was told CSW can assist with substance use placement. CSW informed pt that TOC cannot assist with substance use placement within the hospital. Pt gave his verbal understanding. When asked if pt needed any other resources such as housing, food, etc, - pt stated he has all that. Pt asked CSW to contact the [facility] regarding a substance use program. CSW left a VM for [facility]. CSW attempted to contact pt via mobile number listed in the chart but the number belongs to his niece. Niece knows that pt is in the hospital, CSW informed niece that pt was asking about [facility] substance use program and that VM was left for facility. Niece stated pt has told her about the program and that he states someone he knows went through that same program.  TOC will continue to follow if needed.   Johnnette Gourd, MSW, LCSWA Transitions of Care [phone]

## 2023-05-30 NOTE — Care Management Obs Status (Signed)
MEDICARE OBSERVATION STATUS NOTIFICATION   Patient Details  Name: Bayani Cesena MRN: [id number] Date of Birth: [birth year]   Medicare Observation Status Notification Given:  Yes    Leone Haven, RN 05/30/2023, 3:51 PM
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
## 2023-05-30 NOTE — TOC Initial Note (Signed)
Transition of Care [facility]) - Initial/Assessment Note    Patient Details  Name: Charles [name] MRN: [id number] Date of Birth: [birth year]  Transition [facility]) CM/SW Contact:    Leone Haven, RN Phone Number: 05/30/2023, 4:05 PM  Clinical Narrative:                 Homeless, stays in shelter, has PCP Dr. Parke Simmers  and insurance on file, states has no HH services in place at this time or DME at home.  States he thinks he will need a bus pass at [employer] and niece is support system, states gets would like to get medication from [facility]  pharmacy.  Pta self ambulatory.  Expected Discharge Plan: Homeless Shelter Barriers to Discharge: Continued Medical Work up   Patient Goals and CMS Choice Patient states their goals for this hospitalization and ongoing recovery are:: return to shelter   Choice offered to / list presented to : NA      Expected Discharge Plan and Services In-house Referral: NA Discharge Planning Services: CM Consult Post Acute Care Choice: NA Living arrangements for the past 2 months: Homeless Shelter                 DME Arranged: N/A DME Agency: NA       HH Arranged: NA          Prior Living Arrangements/Services Living arrangements for the past 2 months: Homeless Shelter Lives with::  (homeless) Patient language and need for interpreter reviewed:: Yes Do you feel safe going back to the place where you live?: Yes      Need for Family Participation in Patient Care: No (Comment) Care giver support system in place?: No (comment)   Criminal Activity/Legal Involvement Pertinent to Current Situation/Hospitalization: No - Comment as needed  Activities of Daily Living   ADL Screening (condition at time of admission) Independently performs ADLs?: Yes (appropriate for developmental age) Is the patient deaf or have difficulty hearing?: No Does the patient have difficulty seeing, even when wearing glasses/contacts?: Yes Does the patient have difficulty  concentrating, remembering, or making decisions?: No  Permission Sought/Granted Permission sought to share information with : Case Manager Permission granted to share information with : Yes, Verbal Permission Granted              Emotional Assessment Appearance:: Appears stated age Attitude/Demeanor/Rapport: Engaged Affect (typically observed): Appropriate Orientation: : Oriented to Self, Oriented to Place, Oriented to  Time, Oriented to Situation Alcohol / Substance Use: Alcohol Use, Illicit Drugs Psych Involvement: No (comment)  Admission diagnosis:  Acute exacerbation of CHF (congestive heart failure) (HCC) [I50.9] Congestive heart failure, unspecified HF chronicity, unspecified heart failure type (HCC) [I50.9] Patient Active Problem List   Diagnosis Date Noted   Acute exacerbation of CHF (congestive heart failure) (HCC) 05/29/2023   Acute on chronic HFrEF (heart failure with reduced ejection fraction) (HCC) 05/08/2023   Nausea vomiting and diarrhea 04/14/2023   Macrocytic anemia 02/22/2023   Chronic kidney disease, stage III (moderate) (HCC) 02/22/2023   Acute on chronic systolic CHF (congestive heart failure) (HCC) 01/27/2023   Prolonged QT interval 01/27/2023   Elevated blood pressure reading with diagnosis of hypertension 11/27/2022   Hypertensive crisis 11/26/2022   Generalized weakness 11/26/2022   Laceration of hand 11/16/2022   COPD (chronic obstructive pulmonary disease) (HCC) 10/03/2022   CHF (congestive heart failure) (HCC) 10/02/2022   Acute hypoxic respiratory failure (HCC) 09/03/2022   Chronic pain 08/13/2022   Acute on  chronic congestive heart failure (HCC) 08/10/2022   HTN (hypertension) 07/22/2022   Acute on chronic systolic congestive heart failure (HCC) 02/28/2022   Elevated troponin 02/28/2022   CKD (chronic kidney disease), stage II 02/28/2022   Cocaine abuse (HCC) 02/28/2022   Bacteremia 05/27/2021   Acute on chronic systolic and diastolic CHF  (congestive heart failure) (HCC) 05/27/2021   Hypertensive urgency 05/26/2021   Polysubstance abuse (HCC) 05/26/2021   COPD with acute exacerbation (HCC) 05/26/2021   Chest pain 05/26/2021   Chronic hepatitis C (HCC) 05/26/2021   Homelessness 05/26/2021   Seizure (HCC) 06/06/2020   Hypokalemia 06/22/2019   Splenic laceration, initial encounter 08/02/2016   Thrombocytopenia (HCC) 10/19/2014   Tobacco abuse 10/19/2014   Acute kidney injury superimposed on chronic kidney disease (HCC) 10/19/2014   PCP:  Renaye Rakers, MD Pharmacy:   [facility] [phone] - [place], [place] - [address] AT [facility] [address] [place] [address] Phone: [phone] Fax: [phone]  [id number] [place], [place] - [address] AT [facility] OF [address] [place] [address] Phone: [phone] Fax: [phone]  [facility] [address] [place] [address] Phone: [phone] Fax: [phone]   - [facility] [address] [place] [address] Phone: [phone] Fax: [phone]  [facility] [address] [place] [address] Phone: [phone] Fax: [phone]     Social Drivers of Health (SDOH) Social History: SDOH Screenings   Food Insecurity: Food Insecurity Present (05/29/2023)  Housing: High Risk (05/29/2023)  Transportation Needs: Unmet Transportation Needs (05/29/2023)  Utilities: At Risk (05/29/2023)  Alcohol Screen: Medium Risk (02/24/2023)  Financial Resource Strain: High Risk (02/24/2023)  Social Connections: Unknown (09/24/2021)   Received from [facility]  Tobacco Use: High Risk (05/29/2023)   SDOH Interventions:     Readmission Risk Interventions    01/29/2023    8:43 AM 10/04/2022   11:27 AM 08/12/2022    4:45 PM  Readmission Risk  Prevention Plan  Transportation Screening Complete Complete Complete  Medication Review (RN Care Manager) Complete Complete Complete  PCP or Specialist appointment within 3-5 days of discharge Complete Complete Complete  HRI or Home Care Consult Complete Complete Complete  SW Recovery Care/Counseling Consult  Complete Complete  Palliative Care Screening Not Applicable Not Applicable Not Applicable  Skilled Nursing Facility Not Applicable Not Applicable Not Applicable

## 2023-05-30 NOTE — Plan of Care (Signed)

## 2023-05-30 NOTE — Progress Notes (Signed)
Mobility Specialist Progress Note:   05/30/23 0940  Mobility  Activity Ambulated independently in hallway  Level of Assistance Independent  Assistive Device None  Distance Ambulated (ft) 450 ft  Activity Response Tolerated well  Mobility Referral Yes  Mobility visit 1 Mobility  Mobility Specialist Start Time (ACUTE ONLY) 0940  Mobility Specialist Stop Time (ACUTE ONLY) [id number]  Mobility Specialist Time Calculation (min) (ACUTE ONLY) 12 min   Pt agreeable to mobility session. Required no physical assistance. No SOB noted, however pt continues to c/o DOE. SpO2 96% on RA. States he will not take his medicine at home, because he likes coming to the hospital. States "its like a hotel with ice cream and pretty nurses". Pt back in bed with all needs met.   Addison Lank Mobility Specialist Please contact via SecureChat or  Rehab office at [phone]

## 2023-05-30 NOTE — Progress Notes (Signed)
PROGRESS NOTE    Charles [name]  WUJ:[id number] DOB: March 31, 1971 DOA: 05/29/2023 PCP: Renaye Rakers, MD  52/M with chronic systolic CHF, homelessness, polysubstance abuse, chronic anemia/thrombocytopenia, hepatitis C, COPD presented to the ED with shortness of breath.  He lives at a shelter or sometimes with a friend, reports noncompliance with medications, did not get them last time from [facility].  Multiple hospitalizations for same -In the ED hypertensive, hemoglobin 12.1, creatinine 1.3, BNP> 2600, troponin 90, UDS positive for cocaine, chest x-ray with cardiomegaly and pulmonary vascular congestion  Subjective: -Complains of some shortness of breath  Assessment and Plan:  Acute on chronic systolic CHF Essential hypertension -Last echo 9/24 with EF of 25 to 30%, grade 2 DD, mildly reduced RV  -Multiple hospitalizations complicated by noncompliance related to homelessness, polysubstance abuse -Continue Lasix 40 Mg twice daily -Continue losartan, Jardiance, Aldactone -Will send meds to [facility] at discharge Social work consult   Elevated troponin Likely demand ischemia in the setting of CHF exacerbation  CKD stage IIIa   Polysubstance abuse (cocaine, opiates, THC, tobacco) UDS positive for cocaine. Counseled to quit.   Chronic hep C -Albumin is 2.9 with some pancytopenia, would not be surprised if he has cirrhosis Outpatient GI follow-up   COPD No wheezing.  As needed DuoNeb   Medication noncompliance Homelessness, polysubstance abuse -TOC consult  DVT prophylaxis: Add Lovenox Code Status: Full code Family Communication: None present Disposition Plan: Home likely 2 to 3 days  Consultants:    Procedures:   Antimicrobials:    Objective: Vitals:   05/29/23 1952 05/30/23 0015 05/30/23 0533 05/30/23 0825  BP: (!) 143/83 (!) 147/91 (!) 172/86 (!) 158/98  Pulse: 94 86 84 86  Resp: 20 20 20 18   Temp: 97.7 F (36.5 C) 98.2 F (36.8 C) 98.2 F (36.8 C) 98 F  (36.7 C)  TempSrc: Oral Oral Oral Oral  SpO2: 96% 97% 96% 100%  Weight:      Height:        Intake/Output Summary (Last 24 hours) at 05/30/2023 1011 Last data filed at 05/29/2023 2300 Gross per 24 hour  Intake 737 ml  Output 1825 ml  Net -1088 ml   Filed Weights   05/29/23 1036 05/29/23 1753  Weight: 69.9 kg 72.3 kg    Examination:  General exam: Appears calm and comfortable  HEENT: Positive JVD CVS: S1-S2, regular rhythm, systolic murmur Lungs: Decreased breath sounds at the bases Abdomen: Soft, nontender, bowel sounds present Extremities: Trace edema Skin: No rashes Psychiatry:  Mood & affect appropriate.     Data Reviewed:   CBC: Recent Labs  Lab 05/29/23 1151 05/30/23 0252  WBC 3.8* 3.3*  NEUTROABS 2.6  --   HGB 12.1* 11.7*  HCT 36.0* 34.8*  MCV 103.2* 102.7*  PLT 115* 123*   Basic Metabolic Panel: Recent Labs  Lab 05/29/23 1151 05/30/23 0252  NA 139 139  K 4.3 3.8  CL 112* 106  CO2 21* 26  GLUCOSE 94 110*  BUN 23* 22*  CREATININE 1.37* 1.37*  CALCIUM 8.7* 8.4*  MG  --  1.9  PHOS  --  4.1   GFR: Estimated Creatinine Clearance: 56.9 mL/min (A) (by C-G formula based on SCr of 1.37 mg/dL (H)). Liver Function Tests: Recent Labs  Lab 05/29/23 1151  AST 36  ALT 38  ALKPHOS 66  BILITOT 1.3*  PROT 6.2*  ALBUMIN 2.9*   No results for input(s): "LIPASE", "AMYLASE" in the last 168 hours. No results for input(s): "  AMMONIA" in the last 168 hours. Coagulation Profile: No results for input(s): "INR", "PROTIME" in the last 168 hours. Cardiac Enzymes: No results for input(s): "CKTOTAL", "CKMB", "CKMBINDEX", "TROPONINI" in the last 168 hours. BNP (last 3 results) No results for input(s): "PROBNP" in the last 8760 hours. HbA1C: No results for input(s): "HGBA1C" in the last 72 hours. CBG: No results for input(s): "GLUCAP" in the last 168 hours. Lipid Profile: No results for input(s): "CHOL", "HDL", "LDLCALC", "TRIG", "CHOLHDL", "LDLDIRECT" in the  last 72 hours. Thyroid Function Tests: No results for input(s): "TSH", "T4TOTAL", "FREET4", "T3FREE", "THYROIDAB" in the last 72 hours. Anemia Panel: No results for input(s): "VITAMINB12", "FOLATE", "FERRITIN", "TIBC", "IRON", "RETICCTPCT" in the last 72 hours. Urine analysis:    Component Value Date/Time   COLORURINE YELLOW 04/24/2023 0815   APPEARANCEUR CLEAR 04/24/2023 0815   LABSPEC 1.017 04/24/2023 0815   PHURINE 6.0 04/24/2023 0815   GLUCOSEU 150 (A) 04/24/2023 0815   HGBUR SMALL (A) 04/24/2023 0815   BILIRUBINUR NEGATIVE 04/24/2023 0815   KETONESUR NEGATIVE 04/24/2023 0815   PROTEINUR 30 (A) 04/24/2023 0815   UROBILINOGEN 1.0 03/10/2015 1203   NITRITE NEGATIVE 04/24/2023 0815   LEUKOCYTESUR NEGATIVE 04/24/2023 0815   Sepsis Labs: @LABRCNTIP (procalcitonin:4,lacticidven:4)  )No results found for this or any previous visit (from the past 240 hours).   Radiology Studies: DG Chest Port 1 View Result Date: 05/29/2023 CLINICAL DATA:  Shortness of breath. EXAM: PORTABLE CHEST 1 VIEW COMPARISON:  Chest radiograph dated May 08, 2023. FINDINGS: Cardiomegaly with pulmonary vascular congestion. No overt pulmonary edema. No focal consolidation, sizeable pleural effusion, or pneumothorax. No acute osseous abnormality. IMPRESSION: Cardiomegaly with pulmonary vascular congestion. No focal consolidation. Electronically Signed   By: Hart Robinsons M.D.   On: 05/29/2023 11:52     Scheduled Meds:  amLODipine  5 mg Oral Daily   empagliflozin  10 mg Oral QAC breakfast   furosemide  40 mg Intravenous BID   losartan  25 mg Oral Daily   [START ON 05/31/2023] spironolactone  25 mg Oral Daily   Continuous Infusions:   LOS: 0 days    Time spent:    Zannie Cove, MD [facility]   05/30/2023, 10:11 AM

## 2023-05-30 NOTE — Progress Notes (Signed)
Plan of care is reviewed. Pt is progressing. He is alert and fully oriented x 4, anxious, afebrile, stable hemodynamically, NSR on the monitor, on room air, normal respiration. He is able to ambulate independently, no acute distress noted overnight.   Pt has obviously craving for sweet sugar and carb. He has requested juice and snack frequently tonight.  Pt expressed himself and stated " I have to eat a lot, I cannot use drugs here. I can sleep when I am full. I will be honest with you. I like to stay in the hospital. I feel safe here.  I will not take any medication outside the hospital, because I like to stay in the hospital. I will keep coming back here."    Filiberto Pinks, RN

## 2023-05-31 DIAGNOSIS — Z91128 Patient's intentional underdosing of medication regimen for other reason: Secondary | ICD-10-CM | POA: Diagnosis not present

## 2023-05-31 DIAGNOSIS — Z91014 Allergy to mammalian meats: Secondary | ICD-10-CM | POA: Diagnosis not present

## 2023-05-31 DIAGNOSIS — B182 Chronic viral hepatitis C: Secondary | ICD-10-CM | POA: Diagnosis present

## 2023-05-31 DIAGNOSIS — Z801 Family history of malignant neoplasm of trachea, bronchus and lung: Secondary | ICD-10-CM | POA: Diagnosis not present

## 2023-05-31 DIAGNOSIS — I5023 Acute on chronic systolic (congestive) heart failure: Secondary | ICD-10-CM | POA: Diagnosis present

## 2023-05-31 DIAGNOSIS — J449 Chronic obstructive pulmonary disease, unspecified: Secondary | ICD-10-CM | POA: Diagnosis present

## 2023-05-31 DIAGNOSIS — Z7151 Drug abuse counseling and surveillance of drug abuser: Secondary | ICD-10-CM | POA: Diagnosis not present

## 2023-05-31 DIAGNOSIS — I509 Heart failure, unspecified: Secondary | ICD-10-CM

## 2023-05-31 DIAGNOSIS — N179 Acute kidney failure, unspecified: Secondary | ICD-10-CM | POA: Diagnosis not present

## 2023-05-31 DIAGNOSIS — Z7984 Long term (current) use of oral hypoglycemic drugs: Secondary | ICD-10-CM | POA: Diagnosis not present

## 2023-05-31 DIAGNOSIS — Z91012 Allergy to eggs: Secondary | ICD-10-CM | POA: Diagnosis not present

## 2023-05-31 DIAGNOSIS — F17210 Nicotine dependence, cigarettes, uncomplicated: Secondary | ICD-10-CM | POA: Diagnosis present

## 2023-05-31 DIAGNOSIS — F1910 Other psychoactive substance abuse, uncomplicated: Secondary | ICD-10-CM | POA: Diagnosis present

## 2023-05-31 DIAGNOSIS — Z5901 Sheltered homelessness: Secondary | ICD-10-CM | POA: Diagnosis not present

## 2023-05-31 DIAGNOSIS — I130 Hypertensive heart and chronic kidney disease with heart failure and stage 1 through stage 4 chronic kidney disease, or unspecified chronic kidney disease: Secondary | ICD-10-CM | POA: Diagnosis present

## 2023-05-31 DIAGNOSIS — M19072 Primary osteoarthritis, left ankle and foot: Secondary | ICD-10-CM | POA: Diagnosis present

## 2023-05-31 DIAGNOSIS — I2489 Other forms of acute ischemic heart disease: Secondary | ICD-10-CM | POA: Diagnosis present

## 2023-05-31 DIAGNOSIS — D61818 Other pancytopenia: Secondary | ICD-10-CM | POA: Diagnosis present

## 2023-05-31 DIAGNOSIS — N1831 Chronic kidney disease, stage 3a: Secondary | ICD-10-CM | POA: Diagnosis present

## 2023-05-31 DIAGNOSIS — F1410 Cocaine abuse, uncomplicated: Secondary | ICD-10-CM | POA: Diagnosis present

## 2023-05-31 DIAGNOSIS — Z91018 Allergy to other foods: Secondary | ICD-10-CM | POA: Diagnosis not present

## 2023-05-31 DIAGNOSIS — Z79899 Other long term (current) drug therapy: Secondary | ICD-10-CM | POA: Diagnosis not present

## 2023-05-31 LAB — BASIC METABOLIC PANEL
Anion gap: 9 (ref 5–15)
BUN: 28 mg/dL — ABNORMAL HIGH (ref 6–20)
CO2: 27 mmol/L (ref 22–32)
Calcium: 8.9 mg/dL (ref 8.9–10.3)
Chloride: 98 mmol/L (ref 98–111)
Creatinine, Ser: 1.52 mg/dL — ABNORMAL HIGH (ref 0.61–1.24)
GFR, Estimated: 55 mL/min — ABNORMAL LOW (ref >60)
Glucose, Bld: 110 mg/dL — ABNORMAL HIGH (ref 70–99)
Potassium: 4.2 mmol/L (ref 3.5–5.1)
Sodium: 134 mmol/L — ABNORMAL LOW (ref 135–145)

## 2023-05-31 MED ORDER — NICOTINE 14 MG/24HR TD PT24
14.0000 mg | MEDICATED_PATCH | Freq: Every day | TRANSDERMAL | Status: DC
  Administered 2023-05-31 – 2023-06-04 (×5): 14 mg via TRANSDERMAL
  Filled 2023-05-31 (×5): qty 1

## 2023-05-31 MED ORDER — LOSARTAN POTASSIUM 50 MG PO TABS
25.0000 mg | ORAL_TABLET | Freq: Every day | ORAL | Status: DC
  Administered 2023-06-01 – 2023-06-02 (×2): 25 mg via ORAL
  Filled 2023-05-31 (×2): qty 1

## 2023-05-31 NOTE — Progress Notes (Signed)
Mobility Specialist Progress Note:   05/31/23 0930  Mobility  Activity Ambulated independently in room  Level of Assistance Independent  Assistive Device None  Distance Ambulated (ft) 50 ft  Activity Response Tolerated well  Mobility Referral Yes  Mobility visit 1 Mobility  Mobility Specialist Start Time (ACUTE ONLY) 0930  Mobility Specialist Stop Time (ACUTE ONLY) [id number]  Mobility Specialist Time Calculation (min) (ACUTE ONLY) 8 min   Pt ambulated independently in room, declined hallway ambulation at this time. C/o SOB, SpO2 99% on RA. Pt back in bed with all needs met.  Addison Lank Mobility Specialist Please contact via SecureChat or  Rehab office at [phone]

## 2023-05-31 NOTE — Plan of Care (Signed)

## 2023-05-31 NOTE — Plan of Care (Signed)
  Problem: Education: Goal: Knowledge of General Education information will improve Description: Including pain rating scale, medication(s)/side effects and non-pharmacologic comfort measures 05/31/2023 0558 by Donato Schultz, RN Outcome: Progressing 05/31/2023 0558 by Donato Schultz, RN Outcome: Progressing   Problem: Health Behavior/Discharge Planning: Goal: Ability to manage health-related needs will improve 05/31/2023 0558 by Donato Schultz, RN Outcome: Progressing 05/31/2023 0558 by Donato Schultz, RN Outcome: Progressing   Problem: Clinical Measurements: Goal: Ability to maintain clinical measurements within normal limits will improve 05/31/2023 0558 by Donato Schultz, RN Outcome: Progressing 05/31/2023 0558 by Donato Schultz, RN Outcome: Progressing Goal: Will remain free from infection 05/31/2023 0558 by Donato Schultz, RN Outcome: Progressing 05/31/2023 0558 by Donato Schultz, RN Outcome: Progressing Goal: Diagnostic test results will improve 05/31/2023 0558 by Donato Schultz, RN Outcome: Progressing 05/31/2023 0558 by Donato Schultz, RN Outcome: Progressing Goal: Respiratory complications will improve 05/31/2023 0558 by Donato Schultz, RN Outcome: Progressing 05/31/2023 0558 by Donato Schultz, RN Outcome: Progressing Goal: Cardiovascular complication will be avoided 05/31/2023 0558 by Donato Schultz, RN Outcome: Progressing 05/31/2023 0558 by Donato Schultz, RN Outcome: Progressing   Problem: Activity: Goal: Risk for activity intolerance will decrease 05/31/2023 0558 by Donato Schultz, RN Outcome: Progressing 05/31/2023 0558 by Donato Schultz, RN Outcome: Progressing   Problem: Nutrition: Goal: Adequate nutrition will be maintained 05/31/2023 0558 by Donato Schultz, RN Outcome: Progressing 05/31/2023 0558 by Donato Schultz, RN Outcome: Progressing   Problem: Coping: Goal: Level of anxiety will  decrease Outcome: Progressing   Problem: Elimination: Goal: Will not experience complications related to bowel motility Outcome: Progressing Goal: Will not experience complications related to urinary retention Outcome: Progressing   Problem: Pain Managment: Goal: General experience of comfort will improve and/or be controlled Outcome: Progressing   Problem: Safety: Goal: Ability to remain free from injury will improve Outcome: Progressing   Problem: Skin Integrity: Goal: Risk for impaired skin integrity will decrease Outcome: Progressing

## 2023-05-31 NOTE — Progress Notes (Signed)
PROGRESS NOTE    Charles [name]  [id number] DOB: 1970-05-18 DOA: 05/29/2023 PCP: Renaye Rakers, MD  52/M with chronic systolic CHF, homelessness, polysubstance abuse, chronic anemia/thrombocytopenia, hepatitis C, COPD presented to the ED with shortness of breath.  He lives at a shelter or sometimes with a friend, reports noncompliance with medications, did not get them last time from [facility].  Multiple hospitalizations for same -In the ED hypertensive, hemoglobin 12.1, creatinine 1.3, BNP> 2600, troponin 90, UDS positive for cocaine, chest x-ray with cardiomegaly and pulmonary vascular congestion  Subjective: -Asked for nicotine patch, breathing is improving  Assessment and Plan:  Acute on chronic systolic CHF Essential hypertension -Last echo 9/24 with EF of 25 to 30%, grade 2 DD, mildly reduced RV  -Multiple hospitalizations complicated by noncompliance related to homelessness, polysubstance abuse -Improving with diuresis, off 5.1 L negative, mild uptrend in creatinine hold today's dose of losartan -Continue Jardiance, Aldactone -Will send meds to [facility] at discharge Social work consulted   Elevated troponin Likely demand ischemia in the setting of CHF exacerbation  CKD stage IIIa   Polysubstance abuse (cocaine, opiates, THC, tobacco) UDS positive for cocaine. Counseled to quit.   Chronic hep C -Albumin is 2.9 with some pancytopenia, would not be surprised if he has cirrhosis Outpatient GI follow-up   COPD No wheezing.  As needed DuoNeb -Add nicotine patch   Medication noncompliance Homelessness, polysubstance abuse -TOC consult  DVT prophylaxis: Will not use Lovenox, Xarelto DVT prophylaxis dose Code Status: Full code Family Communication: None present Disposition Plan: Home likely 2 to 3 days  Consultants:    Procedures:   Antimicrobials:    Objective: Vitals:   05/30/23 2345 05/31/23 0351 05/31/23 0814 05/31/23 0926  BP: 131/80 116/83 (!)  116/97 (!) 116/97  Pulse: 79 83 83   Resp: 20 17 20    Temp: 98.4 F (36.9 C) 97.6 F (36.4 C) 97.8 F (36.6 C)   TempSrc: Oral Oral Oral   SpO2: 98% 99% 91%   Weight:  69.3 kg    Height:        Intake/Output Summary (Last 24 hours) at 05/31/2023 1054 Last data filed at 05/31/2023 0817 Gross per 24 hour  Intake 960 ml  Output 4450 ml  Net -3490 ml   Filed Weights   05/29/23 1036 05/29/23 1753 05/31/23 0351  Weight: 69.9 kg 72.3 kg 69.3 kg    Examination:  General exam: Chronically ill male appears older than stated age, AAOx3 HEENT: Positive JVD CVS: S1-S2, regular rhythm, systolic murmur Lungs: Improving air movement Abdomen: Soft, nontender, bowel sounds present  Extremities: Trace edema Skin: No rashes Psychiatry:  Mood & affect appropriate.     Data Reviewed:   CBC: Recent Labs  Lab 05/29/23 1151 05/30/23 0252  WBC 3.8* 3.3*  NEUTROABS 2.6  --   HGB 12.1* 11.7*  HCT 36.0* 34.8*  MCV 103.2* 102.7*  PLT 115* 123*   Basic Metabolic Panel: Recent Labs  Lab 05/29/23 1151 05/30/23 0252 05/31/23 0237  NA 139 139 134*  K 4.3 3.8 4.2  CL 112* 106 98  CO2 21* 26 27  GLUCOSE 94 110* 110*  BUN 23* 22* 28*  CREATININE 1.37* 1.37* 1.52*  CALCIUM 8.7* 8.4* 8.9  MG  --  1.9  --   PHOS  --  4.1  --    GFR: Estimated Creatinine Clearance: 51.3 mL/min (A) (by C-G formula based on SCr of 1.52 mg/dL (H)). Liver Function Tests: Recent Labs  Lab  05/29/23 1151  AST 36  ALT 38  ALKPHOS 66  BILITOT 1.3*  PROT 6.2*  ALBUMIN 2.9*   No results for input(s): "LIPASE", "AMYLASE" in the last 168 hours. No results for input(s): "AMMONIA" in the last 168 hours. Coagulation Profile: No results for input(s): "INR", "PROTIME" in the last 168 hours. Cardiac Enzymes: No results for input(s): "CKTOTAL", "CKMB", "CKMBINDEX", "TROPONINI" in the last 168 hours. BNP (last 3 results) No results for input(s): "PROBNP" in the last 8760 hours. HbA1C: No results for  input(s): "HGBA1C" in the last 72 hours. CBG: No results for input(s): "GLUCAP" in the last 168 hours. Lipid Profile: No results for input(s): "CHOL", "HDL", "LDLCALC", "TRIG", "CHOLHDL", "LDLDIRECT" in the last 72 hours. Thyroid Function Tests: No results for input(s): "TSH", "T4TOTAL", "FREET4", "T3FREE", "THYROIDAB" in the last 72 hours. Anemia Panel: No results for input(s): "VITAMINB12", "FOLATE", "FERRITIN", "TIBC", "IRON", "RETICCTPCT" in the last 72 hours. Urine analysis:    Component Value Date/Time   COLORURINE YELLOW 04/24/2023 0815   APPEARANCEUR CLEAR 04/24/2023 0815   LABSPEC 1.017 04/24/2023 0815   PHURINE 6.0 04/24/2023 0815   GLUCOSEU 150 (A) 04/24/2023 0815   HGBUR SMALL (A) 04/24/2023 0815   BILIRUBINUR NEGATIVE 04/24/2023 0815   KETONESUR NEGATIVE 04/24/2023 0815   PROTEINUR 30 (A) 04/24/2023 0815   UROBILINOGEN 1.0 03/10/2015 1203   NITRITE NEGATIVE 04/24/2023 0815   LEUKOCYTESUR NEGATIVE 04/24/2023 0815   Sepsis Labs: @LABRCNTIP (procalcitonin:4,lacticidven:4)  )No results found for this or any previous visit (from the past 240 hours).   Radiology Studies: DG Chest Port 1 View Result Date: 05/29/2023 CLINICAL DATA:  Shortness of breath. EXAM: PORTABLE CHEST 1 VIEW COMPARISON:  Chest radiograph dated May 08, 2023. FINDINGS: Cardiomegaly with pulmonary vascular congestion. No overt pulmonary edema. No focal consolidation, sizeable pleural effusion, or pneumothorax. No acute osseous abnormality. IMPRESSION: Cardiomegaly with pulmonary vascular congestion. No focal consolidation. Electronically Signed   By: Hart Robinsons M.D.   On: 05/29/2023 11:52     Scheduled Meds:  amLODipine  5 mg Oral Daily   empagliflozin  10 mg Oral QAC breakfast   furosemide  40 mg Intravenous BID   [START ON 06/01/2023] losartan  25 mg Oral Daily   nicotine  14 mg Transdermal Daily   rivaroxaban  10 mg Oral Daily   spironolactone  25 mg Oral Daily   Continuous  Infusions:   LOS: 0 days    Time spent:    Zannie Cove, MD [facility]   05/31/2023, 10:54 AM

## 2023-06-01 DIAGNOSIS — I509 Heart failure, unspecified: Secondary | ICD-10-CM | POA: Diagnosis not present

## 2023-06-01 LAB — BASIC METABOLIC PANEL
Anion gap: 10 (ref 5–15)
BUN: 29 mg/dL — ABNORMAL HIGH (ref 6–20)
CO2: 24 mmol/L (ref 22–32)
Calcium: 8.6 mg/dL — ABNORMAL LOW (ref 8.9–10.3)
Chloride: 99 mmol/L (ref 98–111)
Creatinine, Ser: 1.58 mg/dL — ABNORMAL HIGH (ref 0.61–1.24)
GFR, Estimated: 52 mL/min — ABNORMAL LOW (ref >60)
Glucose, Bld: 151 mg/dL — ABNORMAL HIGH (ref 70–99)
Potassium: 4.1 mmol/L (ref 3.5–5.1)
Sodium: 133 mmol/L — ABNORMAL LOW (ref 135–145)

## 2023-06-01 LAB — VITAMIN B12: Vitamin B-12: 406 pg/mL (ref 180–914)

## 2023-06-01 MED ORDER — TORSEMIDE 20 MG PO TABS
40.0000 mg | ORAL_TABLET | Freq: Every day | ORAL | Status: DC
  Administered 2023-06-02: 40 mg via ORAL
  Filled 2023-06-01: qty 2

## 2023-06-01 MED ORDER — GABAPENTIN 100 MG PO CAPS
200.0000 mg | ORAL_CAPSULE | Freq: Two times a day (BID) | ORAL | Status: DC
  Administered 2023-06-01 – 2023-06-04 (×7): 200 mg via ORAL
  Filled 2023-06-01 (×7): qty 2

## 2023-06-01 NOTE — Progress Notes (Signed)
Mobility Specialist Progress Note:   06/01/23 1600  Mobility  Activity Ambulated independently in hallway  Level of Assistance Independent  Assistive Device None  Distance Ambulated (ft) 450 ft  Activity Response Tolerated well  Mobility Referral Yes  Mobility visit 1 Mobility  Mobility Specialist Start Time (ACUTE ONLY) 1600  Mobility Specialist Stop Time (ACUTE ONLY) 1613  Mobility Specialist Time Calculation (min) (ACUTE ONLY) 13 min   Pt agreeable to mobility session. Required no physical assistance throughout. C/o R hip stiffness, denied SOB. Pt back in room with all needs met.   Addison Lank Mobility Specialist Please contact via SecureChat or  Rehab office at [phone]

## 2023-06-01 NOTE — Plan of Care (Signed)

## 2023-06-01 NOTE — Progress Notes (Signed)
PROGRESS NOTE    Charles [name]  GNF:[id number] DOB: 22-Apr-1971 DOA: 05/29/2023 PCP: Renaye Rakers, MD  52/M with chronic systolic CHF, homelessness, polysubstance abuse, chronic anemia/thrombocytopenia, hepatitis C, COPD presented to the ED with shortness of breath.  He lives at a shelter or sometimes with a friend, reports noncompliance with medications, did not get them last time from [facility].  Multiple hospitalizations for same -In the ED hypertensive, hemoglobin 12.1, creatinine 1.3, BNP> 2600, troponin 90, UDS positive for cocaine, chest x-ray with cardiomegaly and pulmonary vascular congestion  Subjective: -Complains of leg pains  Assessment and Plan:  Acute on chronic systolic CHF Essential hypertension -Last echo 9/24 with EF of 25 to 30%, grade 2 DD, mildly reduced RV  -Multiple hospitalizations complicated by noncompliance related to homelessness, polysubstance abuse -Improving with diuresis, 6.1 L negative, appears close to euvolemic transition to oral torsemide tomorrow -Continue Jardiance Aldactone and losartan -Will send meds to [facility] at discharge Social work consulted, high risk of quick readmission with poor social situation   Elevated troponin Likely demand ischemia in the setting of CHF exacerbation  CKD stage IIIa   Polysubstance abuse (cocaine, opiates, THC, tobacco) UDS positive for cocaine. Counseled to quit.   Chronic hep C -Albumin is 2.9 with some pancytopenia, would not be surprised if he has cirrhosis Outpatient GI follow-up   COPD No wheezing.  As needed DuoNeb - nicotine patch   Medication noncompliance Homelessness, polysubstance abuse -TOC consult  DVT prophylaxis: Will not use Lovenox, Xarelto DVT prophylaxis dose Code Status: Full code Family Communication: None present Disposition Plan: Home likely 2 to 3 days  Consultants:    Procedures:   Antimicrobials:    Objective: Vitals:   05/31/23 1925 05/31/23 2321 06/01/23  0453 06/01/23 0800  BP: 133/86 (!) 123/97 (!) 112/93 (!) 161/91  Pulse: 85 90 89 70  Resp: 19 18 19 18   Temp: 98.1 F (36.7 C) 98.3 F (36.8 C) 98.3 F (36.8 C)   TempSrc: Oral Oral Oral   SpO2: 98% 97% 97% 98%  Weight:   68.6 kg   Height:        Intake/Output Summary (Last 24 hours) at 06/01/2023 1032 Last data filed at 06/01/2023 0453 Gross per 24 hour  Intake 1080 ml  Output 1900 ml  Net -820 ml   Filed Weights   05/29/23 1753 05/31/23 0351 06/01/23 0453  Weight: 72.3 kg 69.3 kg 68.6 kg    Examination:  General exam: Chronically ill male appears older than stated age, AAOx3 HEENT: Positive JVD CVS: S1-S2, regular rhythm, systolic murmur Lungs: Improved air movement, clear today Abdomen: Soft, nontender, bowel sounds present Extremities: No edema Skin: No rashes Psychiatry:  Mood & affect appropriate.     Data Reviewed:   CBC: Recent Labs  Lab 05/29/23 1151 05/30/23 0252  WBC 3.8* 3.3*  NEUTROABS 2.6  --   HGB 12.1* 11.7*  HCT 36.0* 34.8*  MCV 103.2* 102.7*  PLT 115* 123*   Basic Metabolic Panel: Recent Labs  Lab 05/29/23 1151 05/30/23 0252 05/31/23 0237 06/01/23 0246  NA 139 139 134* 133*  K 4.3 3.8 4.2 4.1  CL 112* 106 98 99  CO2 21* 26 27 24   GLUCOSE 94 110* 110* 151*  BUN 23* 22* 28* 29*  CREATININE 1.37* 1.37* 1.52* 1.58*  CALCIUM 8.7* 8.4* 8.9 8.6*  MG  --  1.9  --   --   PHOS  --  4.1  --   --  GFR: Estimated Creatinine Clearance: 49.4 mL/min (A) (by C-G formula based on SCr of 1.58 mg/dL (H)). Liver Function Tests: Recent Labs  Lab 05/29/23 1151  AST 36  ALT 38  ALKPHOS 66  BILITOT 1.3*  PROT 6.2*  ALBUMIN 2.9*   No results for input(s): "LIPASE", "AMYLASE" in the last 168 hours. No results for input(s): "AMMONIA" in the last 168 hours. Coagulation Profile: No results for input(s): "INR", "PROTIME" in the last 168 hours. Cardiac Enzymes: No results for input(s): "CKTOTAL", "CKMB", "CKMBINDEX", "TROPONINI" in the last  168 hours. BNP (last 3 results) No results for input(s): "PROBNP" in the last 8760 hours. HbA1C: No results for input(s): "HGBA1C" in the last 72 hours. CBG: No results for input(s): "GLUCAP" in the last 168 hours. Lipid Profile: No results for input(s): "CHOL", "HDL", "LDLCALC", "TRIG", "CHOLHDL", "LDLDIRECT" in the last 72 hours. Thyroid Function Tests: No results for input(s): "TSH", "T4TOTAL", "FREET4", "T3FREE", "THYROIDAB" in the last 72 hours. Anemia Panel: No results for input(s): "VITAMINB12", "FOLATE", "FERRITIN", "TIBC", "IRON", "RETICCTPCT" in the last 72 hours. Urine analysis:    Component Value Date/Time   COLORURINE YELLOW 04/24/2023 0815   APPEARANCEUR CLEAR 04/24/2023 0815   LABSPEC 1.017 04/24/2023 0815   PHURINE 6.0 04/24/2023 0815   GLUCOSEU 150 (A) 04/24/2023 0815   HGBUR SMALL (A) 04/24/2023 0815   BILIRUBINUR NEGATIVE 04/24/2023 0815   KETONESUR NEGATIVE 04/24/2023 0815   PROTEINUR 30 (A) 04/24/2023 0815   UROBILINOGEN 1.0 03/10/2015 1203   NITRITE NEGATIVE 04/24/2023 0815   LEUKOCYTESUR NEGATIVE 04/24/2023 0815   Sepsis Labs: @LABRCNTIP (procalcitonin:4,lacticidven:4)  )No results found for this or any previous visit (from the past 240 hours).   Radiology Studies: No results found.    Scheduled Meds:  amLODipine  5 mg Oral Daily   empagliflozin  10 mg Oral QAC breakfast   furosemide  40 mg Intravenous BID   losartan  25 mg Oral Daily   nicotine  14 mg Transdermal Daily   rivaroxaban  10 mg Oral Daily   spironolactone  25 mg Oral Daily   Continuous Infusions:   LOS: 1 day    Time spent:    Zannie Cove, MD [facility]   06/01/2023, 10:32 AM

## 2023-06-02 DIAGNOSIS — I509 Heart failure, unspecified: Secondary | ICD-10-CM | POA: Diagnosis not present

## 2023-06-02 LAB — BASIC METABOLIC PANEL
Anion gap: 9 (ref 5–15)
BUN: 38 mg/dL — ABNORMAL HIGH (ref 6–20)
CO2: 22 mmol/L (ref 22–32)
Calcium: 8.5 mg/dL — ABNORMAL LOW (ref 8.9–10.3)
Chloride: 102 mmol/L (ref 98–111)
Creatinine, Ser: 1.96 mg/dL — ABNORMAL HIGH (ref 0.61–1.24)
GFR, Estimated: 40 mL/min — ABNORMAL LOW (ref >60)
Glucose, Bld: 96 mg/dL (ref 70–99)
Potassium: 4.7 mmol/L (ref 3.5–5.1)
Sodium: 133 mmol/L — ABNORMAL LOW (ref 135–145)

## 2023-06-02 MED ORDER — SALINE SPRAY 0.65 % NA SOLN
1.0000 | NASAL | Status: DC | PRN
  Administered 2023-06-02: 1 via NASAL
  Filled 2023-06-02: qty 44

## 2023-06-02 NOTE — Progress Notes (Signed)
Mobility Specialist Progress Note:   06/02/23 1216  Mobility  Activity Ambulated with assistance in hallway  Level of Assistance Independent  Assistive Device None  Distance Ambulated (ft) 500 ft  Activity Response Tolerated well  Mobility Referral Yes  Mobility visit 1 Mobility  Mobility Specialist Start Time (ACUTE ONLY) 1057  Mobility Specialist Stop Time (ACUTE ONLY) 1107  Mobility Specialist Time Calculation (min) (ACUTE ONLY) 10 min   Received pt ambulating in room requesting to ambulate in hall. No complaints and agreeable to mobility. C/o mild L Hip pain causing him to have a slight limp, otherwsie no c/o.Marland Kitchen Returned to room w/o fault. Left seated w/ call bell in reach and all needs met.   Thompson Grayer Mobility Specialist  Please contact vis Secure Chat or  Rehab Office [phone]

## 2023-06-02 NOTE — Progress Notes (Signed)
Heart Failure Navigator Progress Note  Assessed for Heart & Vascular TOC clinic readiness.  Patient follow up with PCP per Dr. Jomarie Longs  No TOC -   Navigator will sign off at this time.   Rhae Hammock, BSN, Scientist, clinical (histocompatibility and immunogenetics) Only

## 2023-06-02 NOTE — Plan of Care (Signed)

## 2023-06-02 NOTE — Progress Notes (Signed)
PROGRESS NOTE    Charles [name]  KVQ:[id number] DOB: [birth year] DOA: 05/29/2023 PCP: Renaye Rakers, MD  52/M with chronic systolic CHF, homelessness, polysubstance abuse, chronic anemia/thrombocytopenia, hepatitis C, COPD presented to the ED with shortness of breath.  He lives at a shelter or sometimes with a friend, reports noncompliance with medications, did not get them last time from [employer].  Multiple hospitalizations for same -In the ED hypertensive, hemoglobin 12.1, creatinine 1.3, BNP> 2600, troponin 90, UDS positive for cocaine, chest x-ray with cardiomegaly and pulmonary vascular congestion  Subjective: -Complains of sinus congestion, denies dyspnea  Assessment and Plan:  Acute on chronic systolic CHF Essential hypertension -Last echo 9/24 with EF of 25 to 30%, grade 2 DD, mildly reduced RV  -Multiple hospitalizations complicated by noncompliance related to homelessness, polysubstance abuse -Improving with diuresis, 6.1 L negative, now euvolemic -Creatinine bumped to 1.9 today, hold losartan and torsemide -Continue Jardiance Aldactone -Will send meds to [facility] at discharge Social work consulted, high risk of quick readmission with poor social situation/homelessness, anticipate DC tomorrow   Elevated troponin Likely demand ischemia in the setting of CHF exacerbation  CKD stage IIIa   Polysubstance abuse (cocaine, opiates, THC, tobacco) UDS positive for cocaine. Counseled to quit.   Chronic hep C -Albumin is 2.9 with some pancytopenia, would not be surprised if he has cirrhosis Outpatient GI follow-up   COPD No wheezing.  As needed DuoNeb - nicotine patch   Medication noncompliance Homelessness, polysubstance abuse -TOC consult  DVT prophylaxis: Will not use Lovenox, Xarelto DVT prophylaxis dose Code Status: Full code Family Communication: None present Disposition Plan: Likely DC tomorrow  Consultants:    Procedures:   Antimicrobials:     Objective: Vitals:   06/01/23 1944 06/02/23 0039 06/02/23 0501 06/02/23 0730  BP: 108/81 110/81 123/73 (!) 130/90  Pulse: 84 77 66 78  Resp: 18 18 18 18   Temp: 98.2 F (36.8 C) 98 F (36.7 C) 98 F (36.7 C) 97.9 F (36.6 C)  TempSrc: Oral Oral Oral Oral  SpO2: 98% 99% 97% 97%  Weight:   70.6 kg   Height:        Intake/Output Summary (Last 24 hours) at 06/02/2023 1008 Last data filed at 06/02/2023 0843 Gross per 24 hour  Intake 1737 ml  Output 2400 ml  Net -663 ml   Filed Weights   05/31/23 0351 06/01/23 0453 06/02/23 0501  Weight: 69.3 kg 68.6 kg 70.6 kg    Examination:  General exam: Chronically ill male appears older than stated age, AAOx3 HEENT: No JVD CVS: S1-S2, regular rhythm, systolic murmur Lungs: Clear Abdomen: Soft, nontender, bowel sounds present Extremities: No edema Skin: No rashes Psychiatry:  Mood & affect appropriate.     Data Reviewed:   CBC: Recent Labs  Lab 05/29/23 1151 05/30/23 0252  WBC 3.8* 3.3*  NEUTROABS 2.6  --   HGB 12.1* 11.7*  HCT 36.0* 34.8*  MCV 103.2* 102.7*  PLT 115* 123*   Basic Metabolic Panel: Recent Labs  Lab 05/29/23 1151 05/30/23 0252 05/31/23 0237 06/01/23 0246 06/02/23 0255  NA 139 139 134* 133* 133*  K 4.3 3.8 4.2 4.1 4.7  CL 112* 106 98 99 102  CO2 21* 26 27 24 22   GLUCOSE 94 110* 110* 151* 96  BUN 23* 22* 28* 29* 38*  CREATININE 1.37* 1.37* 1.52* 1.58* 1.96*  CALCIUM 8.7* 8.4* 8.9 8.6* 8.5*  MG  --  1.9  --   --   --  PHOS  --  4.1  --   --   --    GFR: Estimated Creatinine Clearance: 39.8 mL/min (A) (by C-G formula based on SCr of 1.96 mg/dL (H)). Liver Function Tests: Recent Labs  Lab 05/29/23 1151  AST 36  ALT 38  ALKPHOS 66  BILITOT 1.3*  PROT 6.2*  ALBUMIN 2.9*   No results for input(s): "LIPASE", "AMYLASE" in the last 168 hours. No results for input(s): "AMMONIA" in the last 168 hours. Coagulation Profile: No results for input(s): "INR", "PROTIME" in the last 168  hours. Cardiac Enzymes: No results for input(s): "CKTOTAL", "CKMB", "CKMBINDEX", "TROPONINI" in the last 168 hours. BNP (last 3 results) No results for input(s): "PROBNP" in the last 8760 hours. HbA1C: No results for input(s): "HGBA1C" in the last 72 hours. CBG: No results for input(s): "GLUCAP" in the last 168 hours. Lipid Profile: No results for input(s): "CHOL", "HDL", "LDLCALC", "TRIG", "CHOLHDL", "LDLDIRECT" in the last 72 hours. Thyroid Function Tests: No results for input(s): "TSH", "T4TOTAL", "FREET4", "T3FREE", "THYROIDAB" in the last 72 hours. Anemia Panel: Recent Labs    06/01/23 1108  VITAMINB12 406   Urine analysis:    Component Value Date/Time   COLORURINE YELLOW 04/24/2023 0815   APPEARANCEUR CLEAR 04/24/2023 0815   LABSPEC 1.017 04/24/2023 0815   PHURINE 6.0 04/24/2023 0815   GLUCOSEU 150 (A) 04/24/2023 0815   HGBUR SMALL (A) 04/24/2023 0815   BILIRUBINUR NEGATIVE 04/24/2023 0815   KETONESUR NEGATIVE 04/24/2023 0815   PROTEINUR 30 (A) 04/24/2023 0815   UROBILINOGEN 1.0 03/10/2015 1203   NITRITE NEGATIVE 04/24/2023 0815   LEUKOCYTESUR NEGATIVE 04/24/2023 0815   Sepsis Labs: @LABRCNTIP (procalcitonin:4,lacticidven:4)  )No results found for this or any previous visit (from the past 240 hours).   Radiology Studies: No results found.    Scheduled Meds:  amLODipine  5 mg Oral Daily   empagliflozin  10 mg Oral QAC breakfast   gabapentin  200 mg Oral BID   nicotine  14 mg Transdermal Daily   rivaroxaban  10 mg Oral Daily   spironolactone  25 mg Oral Daily   Continuous Infusions:   LOS: 2 days    Time spent:    Zannie Cove, MD [facility]   06/02/2023, 10:08 AM

## 2023-06-03 DIAGNOSIS — I509 Heart failure, unspecified: Secondary | ICD-10-CM | POA: Diagnosis not present

## 2023-06-03 LAB — BASIC METABOLIC PANEL
Anion gap: 8 (ref 5–15)
BUN: 45 mg/dL — ABNORMAL HIGH (ref 6–20)
CO2: 26 mmol/L (ref 22–32)
Calcium: 9.1 mg/dL (ref 8.9–10.3)
Chloride: 97 mmol/L — ABNORMAL LOW (ref 98–111)
Creatinine, Ser: 2.08 mg/dL — ABNORMAL HIGH (ref 0.61–1.24)
GFR, Estimated: 38 mL/min — ABNORMAL LOW (ref >60)
Glucose, Bld: 108 mg/dL — ABNORMAL HIGH (ref 70–99)
Potassium: 4.6 mmol/L (ref 3.5–5.1)
Sodium: 131 mmol/L — ABNORMAL LOW (ref 135–145)

## 2023-06-03 NOTE — Progress Notes (Signed)
Mobility Specialist Progress Note:   06/03/23 0945  Mobility  Activity Ambulated independently in hallway  Level of Assistance Independent  Assistive Device None  Distance Ambulated (ft) 500 ft  Activity Response Tolerated well  Mobility Referral Yes  Mobility visit 1 Mobility  Mobility Specialist Start Time (ACUTE ONLY) 0945  Mobility Specialist Stop Time (ACUTE ONLY) [id number]  Mobility Specialist Time Calculation (min) (ACUTE ONLY) 7 min   Pt agreeable to mobility session. Required no physical assistance throughout. C/o minimal knee pain, otherwise asx. Pt back in room with all needs met.   Charles [name] Mobility Specialist Please contact via SecureChat or  Rehab office at [phone]

## 2023-06-03 NOTE — Progress Notes (Signed)
PROGRESS NOTE    Charles [name]  WUJ:[id number] DOB: [birth year] DOA: 05/29/2023 PCP: Renaye Rakers, MD  52/M with chronic systolic CHF, homelessness, polysubstance abuse, chronic anemia/thrombocytopenia, hepatitis C, COPD presented to the ED with shortness of breath.  He lives at a shelter or sometimes with a friend, reports noncompliance with medications, did not get them last time from [employer] [facility].  Multiple hospitalizations for same -In the ED hypertensive, hemoglobin 12.1, creatinine 1.3, BNP> 2600, troponin 90, UDS positive for cocaine, chest x-ray with cardiomegaly and pulmonary vascular congestion -Improving with diuresis -1/20, creatinine trending up, losartan and torsemide held  Subjective: -Complains of headache, denies dyspnea today  Assessment and Plan:  Acute on chronic systolic CHF Essential hypertension -Last echo 9/24 with EF of 25 to 30%, grade 2 DD, mildly reduced RV  -Multiple hospitalizations complicated by noncompliance related to homelessness, polysubstance abuse -Improving with diuresis, 8.8 L negative, creatinine up to 2.0 today, held losartan and torsemide yesterday but had also already gotten a.m. doses early, continue to hold diuretics -Continue Jardiance Aldactone -Will send meds to [facility] at discharge Social work consulted, high risk of quick readmission with poor social situation/homelessness, follow-up with PCP Dr. Parke Simmers on 1/28   Elevated troponin Likely demand ischemia in the setting of CHF exacerbation  CKD stage IIIa   Polysubstance abuse (cocaine, opiates, THC, tobacco) UDS positive for cocaine. Counseled to quit.   Chronic hep C -Albumin is 2.9 with some pancytopenia, would not be surprised if he has cirrhosis Outpatient GI follow-up   COPD No wheezing.  As needed DuoNeb - nicotine patch   Medication noncompliance Homelessness, polysubstance abuse -TOC consult  DVT prophylaxis: Will not use Lovenox, Xarelto DVT prophylaxis dose Code  Status: Full code Family Communication: None present Disposition Plan: Likely DC tomorrow  Consultants:    Procedures:   Antimicrobials:    Objective: Vitals:   06/03/23 0008 06/03/23 0411 06/03/23 0718 06/03/23 0832  BP: 133/79 130/70 134/82 114/76  Pulse: 87 74 73 89  Resp: 18 18 18    Temp: 98.2 F (36.8 C) 98.6 F (37 C) 97.8 F (36.6 C)   TempSrc: Oral Oral Oral   SpO2: 99% 97% 100%   Weight:  70.2 kg    Height:        Intake/Output Summary (Last 24 hours) at 06/03/2023 0958 Last data filed at 06/03/2023 0844 Gross per 24 hour  Intake 800 ml  Output 3300 ml  Net -2500 ml   Filed Weights   06/01/23 0453 06/02/23 0501 06/03/23 0411  Weight: 68.6 kg 70.6 kg 70.2 kg    Examination:  General exam: Chronically ill male appears older than stated age, AAOx3 HEENT: No JVD CVS: S1-S2, regular rhythm, systolic murmur Lungs: Clear Abdomen: Soft, nontender, bowel sounds present Extremities: No edema Skin: No rashes Psychiatry:  Mood & affect appropriate.     Data Reviewed:   CBC: Recent Labs  Lab 05/29/23 1151 05/30/23 0252  WBC 3.8* 3.3*  NEUTROABS 2.6  --   HGB 12.1* 11.7*  HCT 36.0* 34.8*  MCV 103.2* 102.7*  PLT 115* 123*   Basic Metabolic Panel: Recent Labs  Lab 05/30/23 0252 05/31/23 0237 06/01/23 0246 06/02/23 0255 06/03/23 0256  NA 139 134* 133* 133* 131*  K 3.8 4.2 4.1 4.7 4.6  CL 106 98 99 102 97*  CO2 26 27 24 22 26   GLUCOSE 110* 110* 151* 96 108*  BUN 22* 28* 29* 38* 45*  CREATININE 1.37* 1.52* 1.58* 1.96* 2.08*  CALCIUM 8.4* 8.9 8.6* 8.5* 9.1  MG 1.9  --   --   --   --   PHOS 4.1  --   --   --   --    GFR: Estimated Creatinine Clearance: 37.5 mL/min (A) (by C-G formula based on SCr of 2.08 mg/dL (H)). Liver Function Tests: Recent Labs  Lab 05/29/23 1151  AST 36  ALT 38  ALKPHOS 66  BILITOT 1.3*  PROT 6.2*  ALBUMIN 2.9*   No results for input(s): "LIPASE", "AMYLASE" in the last 168 hours. No results for input(s):  "AMMONIA" in the last 168 hours. Coagulation Profile: No results for input(s): "INR", "PROTIME" in the last 168 hours. Cardiac Enzymes: No results for input(s): "CKTOTAL", "CKMB", "CKMBINDEX", "TROPONINI" in the last 168 hours. BNP (last 3 results) No results for input(s): "PROBNP" in the last 8760 hours. HbA1C: No results for input(s): "HGBA1C" in the last 72 hours. CBG: No results for input(s): "GLUCAP" in the last 168 hours. Lipid Profile: No results for input(s): "CHOL", "HDL", "LDLCALC", "TRIG", "CHOLHDL", "LDLDIRECT" in the last 72 hours. Thyroid Function Tests: No results for input(s): "TSH", "T4TOTAL", "FREET4", "T3FREE", "THYROIDAB" in the last 72 hours. Anemia Panel: Recent Labs    06/01/23 1108  VITAMINB12 406   Urine analysis:    Component Value Date/Time   COLORURINE YELLOW 04/24/2023 0815   APPEARANCEUR CLEAR 04/24/2023 0815   LABSPEC 1.017 04/24/2023 0815   PHURINE 6.0 04/24/2023 0815   GLUCOSEU 150 (A) 04/24/2023 0815   HGBUR SMALL (A) 04/24/2023 0815   BILIRUBINUR NEGATIVE 04/24/2023 0815   KETONESUR NEGATIVE 04/24/2023 0815   PROTEINUR 30 (A) 04/24/2023 0815   UROBILINOGEN 1.0 03/10/2015 1203   NITRITE NEGATIVE 04/24/2023 0815   LEUKOCYTESUR NEGATIVE 04/24/2023 0815   Sepsis Labs: @LABRCNTIP (procalcitonin:4,lacticidven:4)  )No results found for this or any previous visit (from the past 240 hours).   Radiology Studies: No results found.    Scheduled Meds:  amLODipine  5 mg Oral Daily   empagliflozin  10 mg Oral QAC breakfast   gabapentin  200 mg Oral BID   nicotine  14 mg Transdermal Daily   rivaroxaban  10 mg Oral Daily   spironolactone  25 mg Oral Daily   Continuous Infusions:   LOS: 3 days    Time spent:    Zannie Cove, MD [facility]   06/03/2023, 9:58 AM

## 2023-06-03 NOTE — Plan of Care (Signed)
  Problem: Education: Goal: Knowledge of General Education information will improve Description Including pain rating scale, medication(s)/side effects and non-pharmacologic comfort measures Outcome: Progressing   Problem: Health Behavior/Discharge Planning: Goal: Ability to manage health-related needs will improve Outcome: Progressing   

## 2023-06-03 NOTE — Plan of Care (Signed)
  Problem: Activity: Goal: Risk for activity intolerance will decrease Outcome: Progressing   Problem: Coping: Goal: Level of anxiety will decrease Outcome: Progressing   Problem: Elimination: Goal: Will not experience complications related to urinary retention Outcome: Progressing   Problem: Skin Integrity: Goal: Risk for impaired skin integrity will decrease Outcome: Progressing

## 2023-06-04 ENCOUNTER — Other Ambulatory Visit (HOSPITAL_COMMUNITY): Payer: Self-pay

## 2023-06-04 DIAGNOSIS — I5023 Acute on chronic systolic (congestive) heart failure: Secondary | ICD-10-CM | POA: Diagnosis not present

## 2023-06-04 LAB — BASIC METABOLIC PANEL
Anion gap: 8 (ref 5–15)
BUN: 37 mg/dL — ABNORMAL HIGH (ref 6–20)
CO2: 24 mmol/L (ref 22–32)
Calcium: 8.7 mg/dL — ABNORMAL LOW (ref 8.9–10.3)
Chloride: 99 mmol/L (ref 98–111)
Creatinine, Ser: 1.45 mg/dL — ABNORMAL HIGH (ref 0.61–1.24)
GFR, Estimated: 58 mL/min — ABNORMAL LOW (ref >60)
Glucose, Bld: 98 mg/dL (ref 70–99)
Potassium: 4.7 mmol/L (ref 3.5–5.1)
Sodium: 131 mmol/L — ABNORMAL LOW (ref 135–145)

## 2023-06-04 MED ORDER — GABAPENTIN 100 MG PO CAPS
100.0000 mg | ORAL_CAPSULE | Freq: Two times a day (BID) | ORAL | 0 refills | Status: DC
  Filled 2023-06-04: qty 180, 90d supply, fill #0

## 2023-06-04 MED ORDER — ALBUTEROL SULFATE HFA 108 (90 BASE) MCG/ACT IN AERS
2.0000 | INHALATION_SPRAY | RESPIRATORY_TRACT | 2 refills | Status: DC | PRN
  Filled 2023-06-04: qty 6.7, 17d supply, fill #0

## 2023-06-04 MED ORDER — ORAL CARE MOUTH RINSE: 15.0000 mL | OROMUCOSAL | Status: DC | PRN

## 2023-06-04 MED ORDER — TORSEMIDE 100 MG PO TABS
100.0000 mg | ORAL_TABLET | Freq: Every day | ORAL | 0 refills | Status: DC
  Filled 2023-06-04: qty 90, 90d supply, fill #0

## 2023-06-04 NOTE — Consult Note (Signed)
[facility] Liaison Consult Note   06/04/2023  Charles [name] 1970/08/07 [id number]  Patient showing in banner  Primary Care Provider:  Renaye Rakers, MD check provider is currently not in an affiliated provider with [facility] [[facility]BC[facility]]   Insurance:  [facility] [employer]   Reason:  Not a beneficiary currently attributed to one of the [facility] populations.   **Patient's primary care provider is not an in-network provider with [facility] at this time.  Will sign off.  For questions, please call:  Charlesetta Shanks, RN, BSN, CCM [place]  [facility] Liaison Direct Dial: [phone] or secure chat Email: [email or web address]

## 2023-06-04 NOTE — Care Management Important Message (Signed)
Important Message  Patient Details  Name: Charles [name] MRN: [id number] Date of Birth: [birth year]   Important Message Given:  Yes - Medicare IM     [name] 06/04/2023, 12:00 PM

## 2023-06-04 NOTE — Progress Notes (Signed)
Discharge instructions reviewed with pt.  Copy of instructions given to pt. [facility] has filled scripts for pt and will be picked up on the way down to the discharge lounge.  Lounge transport has been called and they will pick up pt and take him by [facility] for meds and then to the d/c lounge. Pt has his niece coming to pick him up and he also has 2 bus passes provided by CM.  Pt will be d/c'd via wheelchair with belongings, and will be escorted by transport.   Perseus Westall,RN SWOT

## 2023-06-04 NOTE — Progress Notes (Signed)
Mobility Specialist Progress Note:   06/04/23 1202  Mobility  Activity Ambulated independently in hallway  Level of Assistance Independent  Assistive Device None  Distance Ambulated (ft) 600 ft  Activity Response Tolerated well  Mobility Referral Yes  Mobility visit 1 Mobility  Mobility Specialist Start Time (ACUTE ONLY) 1205  Mobility Specialist Stop Time (ACUTE ONLY) 1216  Mobility Specialist Time Calculation (min) (ACUTE ONLY) 11 min   Pt agreeable to mobility session. Required no physical assistance throughout ambulation. Pt back in bed with all needs met, eager for d/c.  Addison Lank Mobility Specialist Please contact via SecureChat or  Rehab office at [phone]

## 2023-06-04 NOTE — TOC Transition Note (Addendum)
Transition of Care [facility]) - Discharge Note   Patient Details  Name: Charles [name] MRN: [id number] Date of Birth: 1971-03-14  Transition of [facility]) CM/SW Contact:  Leone Haven, RN Phone Number: 06/04/2023, 1:52 PM   Clinical Narrative:    For dc today, NCM assisted him with two bus passes. Patient saying he is going to his niece house.    Final next level of care: Homeless Shelter Barriers to Discharge: No Barriers Identified   Patient Goals and CMS Choice Patient states their goals for this hospitalization and ongoing recovery are:: return to shelter   Choice offered to / list presented to : NA      Discharge Placement                       Discharge Plan and Services Additional resources added to the After Visit Summary for   In-house Referral: NA Discharge Planning Services: CM Consult Post Acute Care Choice: NA          DME Arranged: N/A DME Agency: NA       HH Arranged: NA          Social Drivers of Health (SDOH) Interventions SDOH Screenings   Food Insecurity: Food Insecurity Present (05/29/2023)  Housing: High Risk (05/29/2023)  Transportation Needs: Unmet Transportation Needs (05/29/2023)  Utilities: At Risk (05/29/2023)  Alcohol Screen: Medium Risk (02/24/2023)  Financial Resource Strain: High Risk (02/24/2023)  Social Connections: Unknown (09/24/2021)   Received from [facility]  Tobacco Use: High Risk (05/29/2023)     Readmission Risk Interventions    01/29/2023    8:43 AM 10/04/2022   11:27 AM 08/12/2022    4:45 PM  Readmission Risk Prevention Plan  Transportation Screening Complete Complete Complete  Medication Review (RN Care Manager) Complete Complete Complete  PCP or Specialist appointment within 3-5 days of discharge Complete Complete Complete  HRI or Home Care Consult Complete Complete Complete  SW Recovery Care/Counseling Consult  Complete Complete  Palliative Care Screening Not Applicable Not  Applicable Not Applicable  Skilled Nursing Facility Not Applicable Not Applicable Not Applicable

## 2023-06-04 NOTE — Discharge Summary (Signed)
Physician Discharge Summary  Munasar Conkey WGN:[id number] DOB: 1970-11-05 DOA: 05/29/2023  PCP: Renaye Rakers, MD  Admit date: 05/29/2023 Discharge date: 06/04/2023  Admitted From: Homeless shelter Discharge disposition: Homeless shelter  Recommendations at discharge:  Continue diuresis with torsemide 20 mg daily Encourage compliance to medications. Counseled to quit drugs.   Brief narrative: Charles [name] is a 53 y.o. male with PMH significant for HTN, CHF with EF 25 to 30%, chronic anemia thrombocytopenia, arthritis, substance abuse (cocaine, opiates, THC, tobacco), hep C, COPD. Patient presented to the ED today with complaint of shortness of breath, cough  Patient is homeless and mostly lives in a shelter.  He cites difficulty keeping up with the medicines as an outpatient and hence unable to comply with medicines as an outpatient.  He clearly states, 'I am will take what ever medicines in the hospital but I will not take them out of here.' Recently hospitalized 12/26-12/29 for CHF exacerbation.  He was diuresed with IV Lasix and discharged with oral Lasix and other CHF meds which he did not comply with.  Patient presented today with progressively worsening shortness of breath for 1 week.  Also reported 2 episodes of vomiting today.  In the ED, patient was afebrile, heart rate in 90s, blood pressure 157/109, breathing on room air Initial labs with WBC count 3.8, hemoglobin 12.1, platelet 150, BN/creatinine 23/1.37, BNP more than 2600, troponin 90>71 UDS positive for cocaine Chest x-ray showed cardiomegaly with pulmonary vascular congestion. Patient was given Lasix IV 20 mg after which he made about 1500 mL of urine Hospital service was consulted for inpatient care   Subjective: Patient was seen and examined this morning.  Lying on bed.  Not in distress.  Not on supplemental oxygen.  Adequately diuresed.  Hospital course: Active Problems:   Acute exacerbation of CHF  (congestive heart failure) (HCC)  Acute exacerbation of chronic systolic CHF Essential hypertension Presented with progressive worsening shortness of breath for a week. Patient does not comply with outpatient meds citing difficulty from homelessness Echo from 01/2021 with EF of 25 to 30%, global hypokinesis, moderate LVH, grade 2 diastolic dysfunction Started on IV Lasix.  Negative balance of more than 9 L,  Diuretics were held yesterday because of elevated creatinine.  Creatinine improved today. He may also benefit from full GDMT but he has known history of noncompliance and clearly state that he would not be able to take these medicines.  Will discharge patient on torsemide 20 mg daily.  If he complies with this at least, his risk of rehospitalization would be less. Net IO Since Admission: -9,481 mL [06/04/23 1310]  Elevated troponin Likely demand ischemia in the setting of CHF exacerbation  AKI on CKD stage IIIa Creatinine up trended to peak at 2.08 on 1/21.  Improved back down to baseline today after torsemide and losartan were held. Recent Labs    05/09/23 0500 05/10/23 0214 05/11/23 0157 05/29/23 1151 05/30/23 0252 05/31/23 0237 06/01/23 0246 06/02/23 0255 06/03/23 0256 06/04/23 0238  BUN 18 19 27* 23* 22* 28* 29* 38* 45* 37*  CREATININE 1.37* 1.30* 1.44* 1.37* 1.37* 1.52* 1.58* 1.96* 2.08* 1.45*   Polysubstance abuse (cocaine, opiates, THC, tobacco) UDS positive for cocaine. Counseled to quit.  Chronic hep C Outpatient GI follow-up  COPD No wheezing.  As needed DuoNeb  Goals of care:   Code Status: Full Code     Diet:  Diet Order  Diet general           Diet 2 gram sodium Room service appropriate? Yes; Fluid consistency: Thin  Diet effective now                   Nutritional status:  Body mass index is 25.55 kg/m.       Wounds:  -    Discharge Exam:   Vitals:   06/03/23 1943 06/03/23 2333 06/04/23 0300 06/04/23 0458  BP: (!)  157/99 126/82  (!) 163/100  Pulse: 86 66  78  Resp: 18 18  18   Temp: 98.6 F (37 C) 97.9 F (36.6 C)  97.9 F (36.6 C)  TempSrc: Oral Oral  Oral  SpO2: 100% 99%  96%  Weight:   71.8 kg   Height:        Body mass index is 25.55 kg/m.    General exam: Pleasant, middle-aged African-American male Skin: No rashes, lesions or ulcers. HEENT: Atraumatic, normocephalic, no obvious bleeding Lungs: Clear to auscultation bilaterally  CVS: S1, S2, no murmur,   GI/Abd: Soft, nontender, nondistended, bowel sound present,   CNS: Alert, awake, oriented x 3 Psychiatry: Mood appropriate,  Extremities: Trace bilateral pedal edema, no calf tenderness,   Follow ups:    Follow-up Information     Renaye Rakers, MD Follow up.   Specialty: Family Medicine Why: Patient had been since 2021, had to be re-established per Larita Fife. Please GO: Jan. 28 10:30am. Bring insurance card, picture ID, medicine bottle and wear a Mask. Contact information: [address], [address] [place] [address] [phone]                 Discharge Instructions:   Discharge Instructions     Call MD for:  difficulty breathing, headache or visual disturbances   Complete by: As directed    Call MD for:  extreme fatigue   Complete by: As directed    Call MD for:  hives   Complete by: As directed    Call MD for:  persistant dizziness or light-headedness   Complete by: As directed    Call MD for:  persistant nausea and vomiting   Complete by: As directed    Call MD for:  severe uncontrolled pain   Complete by: As directed    Call MD for:  temperature >100.4   Complete by: As directed    Diet general   Complete by: As directed    Discharge instructions   Complete by: As directed    Recommendations at discharge:   Continue diuresis with torsemide 20 mg daily  Encourage compliance to medications.  Counseled to quit drugs.  Discharge instructions for CHF Check weight daily -preferably same time every  day. Restrict fluid intake to 1200 ml daily Restrict salt intake to less than 2 g daily. Call MD if you have one of the following symptoms 1) 3 pound weight gain in 24 hours or 5 pounds in 1 week  2) swelling in the hands, feet or stomach  3) progressive shortness of breath 4) if you have to sleep on extra pillows at night in order to breathe       General discharge instructions: Follow with Primary MD Renaye Rakers, MD in 7 days  Please request your PCP  to go over your hospital tests, procedures, radiology results at the follow up. Please get your medicines reviewed and adjusted.  Your PCP may decide to repeat certain labs or tests as needed. Do  not drive, operate heavy machinery, perform activities at heights, swimming or participation in water activities or provide baby sitting services if your were admitted for syncope or siezures until you have seen by Primary MD or a Neurologist and advised to do so again. [place] Controlled Substance Reporting System database was reviewed. Do not drive, operate heavy machinery, perform activities at heights, swim, participate in water activities or provide baby-sitting services while on medications for pain, sleep and mood until your outpatient physician has reevaluated you and advised to do so again.  You are strongly recommended to comply with the dose, frequency and duration of prescribed medications. Activity: As tolerated with Full fall precautions use walker/cane & assistance as needed Avoid using any recreational substances like cigarette, tobacco, alcohol, or non-prescribed drug. If you experience worsening of your admission symptoms, develop shortness of breath, life threatening emergency, suicidal or homicidal thoughts you must seek medical attention immediately by calling 911 or calling your MD immediately  if symptoms less severe. You must read complete instructions/literature along with all the possible adverse reactions/side effects  for all the medicines you take and that have been prescribed to you. Take any new medicine only after you have completely understood and accepted all the possible adverse reactions/side effects.  Wear Seat belts while driving. You were cared for by a hospitalist during your hospital stay. If you have any questions about your discharge medications or the care you received while you were in the hospital after you are discharged, you can call the unit and ask to speak with the hospitalist or the covering physician. Once you are discharged, your primary care physician will handle any further medical issues. Please note that NO REFILLS for any discharge medications will be authorized once you are discharged, as it is imperative that you return to your primary care physician (or establish a relationship with a primary care physician if you do not have one).   Increase activity slowly   Complete by: As directed        Discharge Medications:   Allergies as of 06/04/2023       Reactions   Egg-derived Products Nausea And Vomiting   Banana Nausea And Vomiting   Pork-derived Products Other (See Comments)   No pork products d/t Religious preference         Medication List     STOP taking these medications    amLODipine 10 MG tablet Commonly known as: NORVASC   cyanocobalamin 1000 MCG tablet Commonly known as: VITAMIN B12   furosemide 40 MG tablet Commonly known as: LASIX   lidocaine 5 % Commonly known as: LIDODERM   losartan 25 MG tablet Commonly known as: COZAAR   spironolactone 25 MG tablet Commonly known as: ALDACTONE       TAKE these medications    albuterol 108 (90 Base) MCG/ACT inhaler Commonly known as: VENTOLIN HFA Inhale 2 puffs into the lungs every 4 (four) hours as needed for wheezing or shortness of breath.   empagliflozin 10 MG Tabs tablet Commonly known as: Jardiance Take 1 tablet (10 mg total) by mouth daily before breakfast. You should get repeat labs in 1-2  weeks.  Follow with your PCP or cardiologist for refills.   gabapentin 100 MG capsule Commonly known as: NEURONTIN Take 1 capsule (100 mg total) by mouth 2 (two) times daily.   torsemide 100 MG tablet Commonly known as: DEMADEX Take 1 tablet (100 mg total) by mouth daily.  The results of significant diagnostics from this hospitalization (including imaging, microbiology, ancillary and laboratory) are listed below for reference.    Procedures and Diagnostic Studies:   DG Chest Port 1 View Result Date: 05/29/2023 CLINICAL DATA:  Shortness of breath. EXAM: PORTABLE CHEST 1 VIEW COMPARISON:  Chest radiograph dated May 08, 2023. FINDINGS: Cardiomegaly with pulmonary vascular congestion. No overt pulmonary edema. No focal consolidation, sizeable pleural effusion, or pneumothorax. No acute osseous abnormality. IMPRESSION: Cardiomegaly with pulmonary vascular congestion. No focal consolidation. Electronically Signed   By: Hart Robinsons M.D.   On: 05/29/2023 11:52     Labs:   Basic Metabolic Panel: Recent Labs  Lab 05/30/23 0252 05/31/23 0237 06/01/23 0246 06/02/23 0255 06/03/23 0256 06/04/23 0238  NA 139 134* 133* 133* 131* 131*  K 3.8 4.2 4.1 4.7 4.6 4.7  CL 106 98 99 102 97* 99  CO2 26 27 24 22 26 24   GLUCOSE 110* 110* 151* 96 108* 98  BUN 22* 28* 29* 38* 45* 37*  CREATININE 1.37* 1.52* 1.58* 1.96* 2.08* 1.45*  CALCIUM 8.4* 8.9 8.6* 8.5* 9.1 8.7*  MG 1.9  --   --   --   --   --   PHOS 4.1  --   --   --   --   --    GFR Estimated Creatinine Clearance: 53.8 mL/min (A) (by C-G formula based on SCr of 1.45 mg/dL (H)). Liver Function Tests: Recent Labs  Lab 05/29/23 1151  AST 36  ALT 38  ALKPHOS 66  BILITOT 1.3*  PROT 6.2*  ALBUMIN 2.9*   No results for input(s): "LIPASE", "AMYLASE" in the last 168 hours. No results for input(s): "AMMONIA" in the last 168 hours. Coagulation profile No results for input(s): "INR", "PROTIME" in the last 168  hours.  CBC: Recent Labs  Lab 05/29/23 1151 05/30/23 0252  WBC 3.8* 3.3*  NEUTROABS 2.6  --   HGB 12.1* 11.7*  HCT 36.0* 34.8*  MCV 103.2* 102.7*  PLT 115* 123*   Cardiac Enzymes: No results for input(s): "CKTOTAL", "CKMB", "CKMBINDEX", "TROPONINI" in the last 168 hours. BNP: Invalid input(s): "POCBNP" CBG: No results for input(s): "GLUCAP" in the last 168 hours. D-Dimer No results for input(s): "DDIMER" in the last 72 hours. Hgb A1c No results for input(s): "HGBA1C" in the last 72 hours. Lipid Profile No results for input(s): "CHOL", "HDL", "LDLCALC", "TRIG", "CHOLHDL", "LDLDIRECT" in the last 72 hours. Thyroid function studies No results for input(s): "TSH", "T4TOTAL", "T3FREE", "THYROIDAB" in the last 72 hours.  Invalid input(s): "FREET3" Anemia work up No results for input(s): "VITAMINB12", "FOLATE", "FERRITIN", "TIBC", "IRON", "RETICCTPCT" in the last 72 hours. Microbiology No results found for this or any previous visit (from the past 240 hours).  Time coordinating discharge: 45 minutes  Signed: Vitaliy Eisenhour  [facility] 06/04/2023, 1:10 PM

## 2023-06-08 ENCOUNTER — Inpatient Hospital Stay (HOSPITAL_COMMUNITY): Payer: Medicare HMO

## 2023-06-08 ENCOUNTER — Emergency Department (HOSPITAL_COMMUNITY): Payer: Medicare HMO

## 2023-06-08 ENCOUNTER — Other Ambulatory Visit: Payer: Self-pay

## 2023-06-08 ENCOUNTER — Inpatient Hospital Stay (HOSPITAL_COMMUNITY)
Admission: EM | Admit: 2023-06-08 | Discharge: 2023-06-11 | DRG: 917 | Disposition: A | Discharge status: Home / Self Care | Payer: Medicare HMO | Attending: Internal Medicine | Admitting: Internal Medicine

## 2023-06-08 ENCOUNTER — Encounter (HOSPITAL_COMMUNITY): Payer: Self-pay

## 2023-06-08 DIAGNOSIS — E860 Dehydration: Secondary | ICD-10-CM | POA: Diagnosis present

## 2023-06-08 DIAGNOSIS — B182 Chronic viral hepatitis C: Secondary | ICD-10-CM | POA: Diagnosis present

## 2023-06-08 DIAGNOSIS — F22 Delusional disorders: Secondary | ICD-10-CM | POA: Diagnosis not present

## 2023-06-08 DIAGNOSIS — T405X1A Poisoning by cocaine, accidental (unintentional), initial encounter: Principal | ICD-10-CM | POA: Diagnosis present

## 2023-06-08 DIAGNOSIS — R7989 Other specified abnormal findings of blood chemistry: Secondary | ICD-10-CM | POA: Diagnosis not present

## 2023-06-08 DIAGNOSIS — F1210 Cannabis abuse, uncomplicated: Secondary | ICD-10-CM | POA: Diagnosis present

## 2023-06-08 DIAGNOSIS — N1832 Chronic kidney disease, stage 3b: Secondary | ICD-10-CM | POA: Diagnosis present

## 2023-06-08 DIAGNOSIS — Z7984 Long term (current) use of oral hypoglycemic drugs: Secondary | ICD-10-CM | POA: Diagnosis not present

## 2023-06-08 DIAGNOSIS — N179 Acute kidney failure, unspecified: Secondary | ICD-10-CM | POA: Diagnosis present

## 2023-06-08 DIAGNOSIS — I429 Cardiomyopathy, unspecified: Secondary | ICD-10-CM | POA: Diagnosis present

## 2023-06-08 DIAGNOSIS — F1110 Opioid abuse, uncomplicated: Secondary | ICD-10-CM | POA: Diagnosis present

## 2023-06-08 DIAGNOSIS — R0902 Hypoxemia: Secondary | ICD-10-CM | POA: Diagnosis present

## 2023-06-08 DIAGNOSIS — I5023 Acute on chronic systolic (congestive) heart failure: Secondary | ICD-10-CM | POA: Diagnosis present

## 2023-06-08 DIAGNOSIS — N189 Chronic kidney disease, unspecified: Secondary | ICD-10-CM | POA: Diagnosis not present

## 2023-06-08 DIAGNOSIS — I21A1 Myocardial infarction type 2: Secondary | ICD-10-CM | POA: Diagnosis present

## 2023-06-08 DIAGNOSIS — Z801 Family history of malignant neoplasm of trachea, bronchus and lung: Secondary | ICD-10-CM

## 2023-06-08 DIAGNOSIS — Z800 Family history of malignant neoplasm of digestive organs: Secondary | ICD-10-CM | POA: Diagnosis not present

## 2023-06-08 DIAGNOSIS — J690 Pneumonitis due to inhalation of food and vomit: Secondary | ICD-10-CM | POA: Diagnosis present

## 2023-06-08 DIAGNOSIS — I499 Cardiac arrhythmia, unspecified: Secondary | ICD-10-CM | POA: Diagnosis not present

## 2023-06-08 DIAGNOSIS — F1410 Cocaine abuse, uncomplicated: Secondary | ICD-10-CM | POA: Diagnosis present

## 2023-06-08 DIAGNOSIS — R0789 Other chest pain: Secondary | ICD-10-CM

## 2023-06-08 DIAGNOSIS — I5022 Chronic systolic (congestive) heart failure: Secondary | ICD-10-CM | POA: Diagnosis present

## 2023-06-08 DIAGNOSIS — R079 Chest pain, unspecified: Secondary | ICD-10-CM | POA: Diagnosis present

## 2023-06-08 DIAGNOSIS — J811 Chronic pulmonary edema: Secondary | ICD-10-CM | POA: Diagnosis not present

## 2023-06-08 DIAGNOSIS — Z5901 Sheltered homelessness: Secondary | ICD-10-CM

## 2023-06-08 DIAGNOSIS — D6959 Other secondary thrombocytopenia: Secondary | ICD-10-CM | POA: Diagnosis present

## 2023-06-08 DIAGNOSIS — J449 Chronic obstructive pulmonary disease, unspecified: Secondary | ICD-10-CM | POA: Diagnosis present

## 2023-06-08 DIAGNOSIS — F17210 Nicotine dependence, cigarettes, uncomplicated: Secondary | ICD-10-CM | POA: Diagnosis present

## 2023-06-08 DIAGNOSIS — R918 Other nonspecific abnormal finding of lung field: Secondary | ICD-10-CM | POA: Diagnosis not present

## 2023-06-08 DIAGNOSIS — Z720 Tobacco use: Secondary | ICD-10-CM | POA: Diagnosis present

## 2023-06-08 DIAGNOSIS — I130 Hypertensive heart and chronic kidney disease with heart failure and stage 1 through stage 4 chronic kidney disease, or unspecified chronic kidney disease: Secondary | ICD-10-CM | POA: Diagnosis present

## 2023-06-08 DIAGNOSIS — M6282 Rhabdomyolysis: Principal | ICD-10-CM | POA: Diagnosis present

## 2023-06-08 DIAGNOSIS — I10 Essential (primary) hypertension: Secondary | ICD-10-CM | POA: Diagnosis not present

## 2023-06-08 DIAGNOSIS — Z5900 Homelessness unspecified: Secondary | ICD-10-CM

## 2023-06-08 DIAGNOSIS — R4182 Altered mental status, unspecified: Secondary | ICD-10-CM | POA: Diagnosis not present

## 2023-06-08 DIAGNOSIS — R9431 Abnormal electrocardiogram [ECG] [EKG]: Secondary | ICD-10-CM | POA: Diagnosis present

## 2023-06-08 DIAGNOSIS — F1910 Other psychoactive substance abuse, uncomplicated: Secondary | ICD-10-CM | POA: Diagnosis present

## 2023-06-08 DIAGNOSIS — I517 Cardiomegaly: Secondary | ICD-10-CM | POA: Diagnosis not present

## 2023-06-08 DIAGNOSIS — R9389 Abnormal findings on diagnostic imaging of other specified body structures: Secondary | ICD-10-CM | POA: Diagnosis not present

## 2023-06-08 LAB — CBC
HCT: 35.4 % — ABNORMAL LOW (ref 39.0–52.0)
HCT: 30.6 % — ABNORMAL LOW (ref 39.0–52.0)
Hemoglobin: 11.8 g/dL — ABNORMAL LOW (ref 13.0–17.0)
Hemoglobin: 10.3 g/dL — ABNORMAL LOW (ref 13.0–17.0)
MCH: 34.2 pg — ABNORMAL HIGH (ref 26.0–34.0)
MCH: 34.2 pg — ABNORMAL HIGH (ref 26.0–34.0)
MCHC: 33.3 g/dL (ref 30.0–36.0)
MCHC: 33.7 g/dL (ref 30.0–36.0)
MCV: 102.6 fL — ABNORMAL HIGH (ref 80.0–100.0)
MCV: 101.7 fL — ABNORMAL HIGH (ref 80.0–100.0)
Platelets: 124 10*3/uL — ABNORMAL LOW (ref 150–400)
Platelets: 105 10*3/uL — ABNORMAL LOW (ref 150–400)
RBC: 3.45 MIL/uL — ABNORMAL LOW (ref 4.22–5.81)
RBC: 3.01 MIL/uL — ABNORMAL LOW (ref 4.22–5.81)
RDW: 17.3 % — ABNORMAL HIGH (ref 11.5–15.5)
RDW: 17.1 % — ABNORMAL HIGH (ref 11.5–15.5)
WBC: 7.7 10*3/uL (ref 4.0–10.5)
WBC: 7.5 10*3/uL (ref 4.0–10.5)
nRBC: 0 % (ref 0.0–0.2)
nRBC: 0 % (ref 0.0–0.2)

## 2023-06-08 LAB — RAPID URINE DRUG SCREEN, HOSP PERFORMED
Amphetamines: NOT DETECTED
Barbiturates: NOT DETECTED
Benzodiazepines: NOT DETECTED
Cocaine: POSITIVE — AB
Opiates: NOT DETECTED
Tetrahydrocannabinol: POSITIVE — AB

## 2023-06-08 LAB — BASIC METABOLIC PANEL
Anion gap: 15 (ref 5–15)
BUN: 74 mg/dL — ABNORMAL HIGH (ref 6–20)
CO2: 17 mmol/L — ABNORMAL LOW (ref 22–32)
Calcium: 8.7 mg/dL — ABNORMAL LOW (ref 8.9–10.3)
Chloride: 98 mmol/L (ref 98–111)
Creatinine, Ser: 3.91 mg/dL — ABNORMAL HIGH (ref 0.61–1.24)
GFR, Estimated: 18 mL/min — ABNORMAL LOW (ref >60)
Glucose, Bld: 128 mg/dL — ABNORMAL HIGH (ref 70–99)
Potassium: 4.6 mmol/L (ref 3.5–5.1)
Sodium: 130 mmol/L — ABNORMAL LOW (ref 135–145)

## 2023-06-08 LAB — URINALYSIS, W/ REFLEX TO CULTURE (INFECTION SUSPECTED)
Bacteria, UA: NONE SEEN
Bilirubin Urine: NEGATIVE
Glucose, UA: NEGATIVE mg/dL
Ketones, ur: NEGATIVE mg/dL
Leukocytes,Ua: NEGATIVE
Nitrite: NEGATIVE
Protein, ur: 30 mg/dL — AB
Specific Gravity, Urine: 1.013 (ref 1.005–1.030)
pH: 5 (ref 5.0–8.0)

## 2023-06-08 LAB — ETHANOL: Alcohol, Ethyl (B): <10 mg/dL (ref <10)

## 2023-06-08 LAB — CREATININE, SERUM
Creatinine, Ser: 3.39 mg/dL — ABNORMAL HIGH (ref 0.61–1.24)
GFR, Estimated: 21 mL/min — ABNORMAL LOW (ref >60)

## 2023-06-08 LAB — MAGNESIUM: Magnesium: 2 mg/dL (ref 1.7–2.4)

## 2023-06-08 LAB — TROPONIN I (HIGH SENSITIVITY)
Troponin I (High Sensitivity): 280 ng/L (ref <18)
Troponin I (High Sensitivity): 268 ng/L (ref <18)

## 2023-06-08 LAB — CK: Total CK: 3540 U/L — ABNORMAL HIGH (ref 49–397)

## 2023-06-08 MED ORDER — IPRATROPIUM-ALBUTEROL 0.5-2.5 (3) MG/3ML IN SOLN: 3.0000 mL | RESPIRATORY_TRACT | Status: DC | PRN

## 2023-06-08 MED ORDER — HEPARIN SODIUM (PORCINE) 5000 UNIT/ML IJ SOLN
5000.0000 [IU] | Freq: Three times a day (TID) | INTRAMUSCULAR | Status: DC
  Administered 2023-06-08 – 2023-06-11 (×8): 5000 [IU] via SUBCUTANEOUS
  Filled 2023-06-08 (×8): qty 1

## 2023-06-08 MED ORDER — LACTATED RINGERS IV BOLUS
1000.0000 mL | Freq: Once | INTRAVENOUS | Status: AC
  Administered 2023-06-08: 1000 mL via INTRAVENOUS

## 2023-06-08 MED ORDER — ACETAMINOPHEN 650 MG RE SUPP: 650.0000 mg | Freq: Four times a day (QID) | RECTAL | Status: DC | PRN

## 2023-06-08 MED ORDER — SODIUM CHLORIDE 0.9 % IV SOLN: INTRAVENOUS | Status: AC

## 2023-06-08 MED ORDER — LORAZEPAM 2 MG/ML IJ SOLN
2.0000 mg | Freq: Once | INTRAMUSCULAR | Status: AC
  Administered 2023-06-08: 2 mg via INTRAVENOUS
  Filled 2023-06-08: qty 1

## 2023-06-08 MED ORDER — LORAZEPAM 2 MG/ML IJ SOLN
2.0000 mg | INTRAMUSCULAR | Status: DC | PRN
Start: 2023-06-08 — End: 2023-06-09
  Administered 2023-06-09 (×2): 2 mg via INTRAVENOUS
  Filled 2023-06-08 (×2): qty 1

## 2023-06-08 MED ORDER — NICOTINE 14 MG/24HR TD PT24
14.0000 mg | MEDICATED_PATCH | Freq: Every day | TRANSDERMAL | Status: DC
  Administered 2023-06-08 – 2023-06-11 (×4): 14 mg via TRANSDERMAL
  Filled 2023-06-08 (×4): qty 1

## 2023-06-08 MED ORDER — ACETAMINOPHEN 325 MG PO TABS
650.0000 mg | ORAL_TABLET | Freq: Four times a day (QID) | ORAL | Status: DC | PRN
Start: 2023-06-08 — End: 2023-06-11
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
## 2023-06-08 NOTE — ED Provider Notes (Signed)
[facility] AT [facility] Provider Note   CSN: [id number] Arrival date & time: 06/08/23  1440     History Chief Complaint  Patient presents with   Chest Pain    HPI Charles [name] is a 53 y.o. male presenting for myriad of complaints.  Initially reported chest pain later reported that he had used cocaine today and has been feeling paranoid not sleeping and cannot hold still.  Patient thrashing around in the bed. Very tangential historian with paranoia seeing hallucinations around him Patient's recorded medical, surgical, social, medication list and allergies were reviewed in the Snapshot window as part of the initial history.   Review of Systems   Review of Systems  Unable to perform ROS: Psychiatric disorder    Physical Exam Updated Vital Signs BP (!) 177/91   Pulse 95   Temp 99.6 F (37.6 C) (Oral)   Resp 20   Ht 5\' 6"  (1.676 m)   Wt 70.8 kg   SpO2 93%   BMI 25.18 kg/m  Physical Exam Vitals and nursing note reviewed.  Constitutional:      General: He is not in acute distress.    Appearance: He is well-developed.  HENT:     Head: Normocephalic and atraumatic.  Eyes:     Conjunctiva/sclera: Conjunctivae normal.  Cardiovascular:     Rate and Rhythm: Regular rhythm. Tachycardia present.     Heart sounds: No murmur heard. Pulmonary:     Effort: Pulmonary effort is normal. Tachypnea present. No respiratory distress.     Breath sounds: Normal breath sounds.  Abdominal:     Palpations: Abdomen is soft.     Tenderness: There is no abdominal tenderness.  Musculoskeletal:        General: No swelling.     Cervical back: Neck supple.  Skin:    General: Skin is warm and dry.     Capillary Refill: Capillary refill takes less than 2 seconds.  Neurological:     Mental Status: He is alert.  Psychiatric:        Mood and Affect: Mood normal.      ED Course/ Medical Decision Making/ A&P    Procedures .Critical Care  Performed by:  Glyn Ade, MD Authorized by: Glyn Ade, MD   Critical care provider statement:    Critical care time (minutes):  30   Critical care was necessary to treat or prevent imminent or life-threatening deterioration of the following conditions:  Toxidrome and metabolic crisis   Critical care was time spent personally by me on the following activities:  Development of treatment plan with patient or surrogate, discussions with consultants, evaluation of patient's response to treatment, examination of patient, ordering and review of laboratory studies, ordering and review of radiographic studies, ordering and performing treatments and interventions, pulse oximetry, re-evaluation of patient's condition and review of old charts   Care discussed with: admitting provider      Medications Ordered in ED Medications  LORazepam (ATIVAN) injection 2 mg (has no administration in time range)  lactated ringers bolus 1,000 mL (1,000 mLs Intravenous New Bag/Given 06/08/23 2012)    Medical Decision Making:   53 year old male with a myriad of complaints.  He appears to be in acute psychotic crisis likely secondary substance use disorder versus primary psychosis.  I am concerned on his exam that he appears severely dehydrated and may have put himself into substance-induced rhabdomyolysis.  Lab work confirmed this with an AKI followed by an elevated  CK.  He has an elevated troponin but this is downtrending likely secondary to his reported cocaine use. He aggressively hydrated in the emergency room.  His toxidrome of sympathomimetic use was treated with benzodiazepine and patient will need admission to medicine for further care management close observation.  Kept in the emergency room for 6-1/2 hours to attempt to stabilize but he continues to be psychotic requiring ongoing care and management.  Clinical Impression:  1. Non-traumatic rhabdomyolysis      Data Unavailable   Final Clinical Impression(s) / ED  Diagnoses Final diagnoses:  Non-traumatic rhabdomyolysis    Rx / DC Orders ED Discharge Orders     None         Glyn Ade, MD 06/08/23 2256

## 2023-06-08 NOTE — ED Notes (Signed)
Applied 4L [place]. MD aware

## 2023-06-08 NOTE — ED Notes (Signed)
Entered room to talk to patient and found him drinking from sink. When I asked why he was, he cursed me out and said he was thirsty. Patient left to rehydrate himself. I was advised by [facility] staff that patient reported to have taken cocaine prior to coming in and was doing "snow angels" in the waiting room floor.
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
## 2023-06-08 NOTE — ED Notes (Signed)
This NT located pt in lobby doing snow angels in the middle of [facility] floor. This NT requested security and [employer] to assist with helping pt into a WC.

## 2023-06-09 ENCOUNTER — Inpatient Hospital Stay (HOSPITAL_COMMUNITY): Payer: Medicare HMO

## 2023-06-09 DIAGNOSIS — N179 Acute kidney failure, unspecified: Secondary | ICD-10-CM | POA: Diagnosis not present

## 2023-06-09 DIAGNOSIS — F1910 Other psychoactive substance abuse, uncomplicated: Secondary | ICD-10-CM | POA: Diagnosis not present

## 2023-06-09 DIAGNOSIS — N189 Chronic kidney disease, unspecified: Secondary | ICD-10-CM | POA: Diagnosis not present

## 2023-06-09 DIAGNOSIS — M6282 Rhabdomyolysis: Secondary | ICD-10-CM | POA: Diagnosis not present

## 2023-06-09 LAB — CBC WITH DIFFERENTIAL/PLATELET
Abs Immature Granulocytes: 0.02 10*3/uL (ref 0.00–0.07)
Basophils Absolute: 0 10*3/uL (ref 0.0–0.1)
Basophils Relative: 0 %
Eosinophils Absolute: 0 10*3/uL (ref 0.0–0.5)
Eosinophils Relative: 0 %
HCT: 32.8 % — ABNORMAL LOW (ref 39.0–52.0)
Hemoglobin: 11.1 g/dL — ABNORMAL LOW (ref 13.0–17.0)
Immature Granulocytes: 0 %
Lymphocytes Relative: 18 %
Lymphs Abs: 1.1 10*3/uL (ref 0.7–4.0)
MCH: 34.5 pg — ABNORMAL HIGH (ref 26.0–34.0)
MCHC: 33.8 g/dL (ref 30.0–36.0)
MCV: 101.9 fL — ABNORMAL HIGH (ref 80.0–100.0)
Monocytes Absolute: 1.2 10*3/uL — ABNORMAL HIGH (ref 0.1–1.0)
Monocytes Relative: 20 %
Neutro Abs: 3.7 10*3/uL (ref 1.7–7.7)
Neutrophils Relative %: 62 %
Platelets: 109 10*3/uL — ABNORMAL LOW (ref 150–400)
RBC: 3.22 MIL/uL — ABNORMAL LOW (ref 4.22–5.81)
RDW: 17 % — ABNORMAL HIGH (ref 11.5–15.5)
WBC: 6 10*3/uL (ref 4.0–10.5)
nRBC: 0 % (ref 0.0–0.2)

## 2023-06-09 LAB — BASIC METABOLIC PANEL
Anion gap: 11 (ref 5–15)
BUN: 61 mg/dL — ABNORMAL HIGH (ref 6–20)
CO2: 19 mmol/L — ABNORMAL LOW (ref 22–32)
Calcium: 8.3 mg/dL — ABNORMAL LOW (ref 8.9–10.3)
Chloride: 102 mmol/L (ref 98–111)
Creatinine, Ser: 2.75 mg/dL — ABNORMAL HIGH (ref 0.61–1.24)
GFR, Estimated: 27 mL/min — ABNORMAL LOW (ref >60)
Glucose, Bld: 90 mg/dL (ref 70–99)
Potassium: 3.9 mmol/L (ref 3.5–5.1)
Sodium: 132 mmol/L — ABNORMAL LOW (ref 135–145)

## 2023-06-09 LAB — BLOOD GAS, VENOUS
Acid-base deficit: 1.5 mmol/L (ref 0.0–2.0)
Bicarbonate: 24.8 mmol/L (ref 20.0–28.0)
O2 Saturation: 97.3 %
Patient temperature: 37
pCO2, Ven: 47 mm[Hg] (ref 44–60)
pH, Ven: 7.33 (ref 7.25–7.43)
pO2, Ven: 88 mm[Hg] — ABNORMAL HIGH (ref 32–45)

## 2023-06-09 LAB — MAGNESIUM: Magnesium: 2.2 mg/dL (ref 1.7–2.4)

## 2023-06-09 LAB — CK: Total CK: 2582 U/L — ABNORMAL HIGH (ref 49–397)

## 2023-06-09 MED ORDER — SODIUM CHLORIDE 0.9 % IV SOLN: INTRAVENOUS | Status: AC

## 2023-06-09 MED ORDER — LORAZEPAM 1 MG PO TABS: 1.0000 mg | ORAL_TABLET | ORAL | Status: DC | PRN

## 2023-06-09 MED ORDER — SODIUM CHLORIDE 0.9 % IV SOLN
3.0000 g | Freq: Once | INTRAVENOUS | Status: AC
  Administered 2023-06-09: 3 g via INTRAVENOUS
  Filled 2023-06-09: qty 8

## 2023-06-09 MED ORDER — SODIUM CHLORIDE 0.9 % IV SOLN
1.5000 g | Freq: Two times a day (BID) | INTRAVENOUS | Status: DC
  Administered 2023-06-09 – 2023-06-10 (×3): 1.5 g via INTRAVENOUS
  Filled 2023-06-09 (×5): qty 4
  Filled 2023-06-09: qty 1.5

## 2023-06-09 MED ORDER — THIAMINE MONONITRATE 100 MG PO TABS
100.0000 mg | ORAL_TABLET | Freq: Every day | ORAL | Status: DC
Start: 2023-06-09 — End: 2023-06-11
  Administered 2023-06-09 – 2023-06-11 (×3): 100 mg via ORAL
  Filled 2023-06-09 (×3): qty 1

## 2023-06-09 MED ORDER — ADULT MULTIVITAMIN W/MINERALS CH
1.0000 | ORAL_TABLET | Freq: Every day | ORAL | Status: DC
  Administered 2023-06-09 – 2023-06-11 (×3): 1 via ORAL
  Filled 2023-06-09 (×3): qty 1

## 2023-06-09 MED ORDER — THIAMINE HCL 100 MG/ML IJ SOLN
100.0000 mg | Freq: Every day | INTRAMUSCULAR | Status: DC
Start: 2023-06-09 — End: 2023-06-11

## 2023-06-09 MED ORDER — LORAZEPAM 2 MG/ML IJ SOLN: 1.0000 mg | INTRAMUSCULAR | Status: DC | PRN

## 2023-06-09 MED ORDER — FOLIC ACID 1 MG PO TABS
1.0000 mg | ORAL_TABLET | Freq: Every day | ORAL | Status: DC
Start: 2023-06-09 — End: 2023-06-11
  Administered 2023-06-09 – 2023-06-11 (×3): 1 mg via ORAL
  Filled 2023-06-09 (×3): qty 1

## 2023-06-09 MED ORDER — LORAZEPAM 2 MG/ML IJ SOLN
1.0000 mg | INTRAMUSCULAR | Status: DC | PRN
  Administered 2023-06-09: 1 mg via INTRAVENOUS
  Filled 2023-06-09: qty 1

## 2023-06-09 NOTE — Progress Notes (Signed)
Pharmacy Antibiotic Note  Charles [name] is a 53 y.o. male admitted on 06/08/2023 with concern for aspiration pneumonia.  Pharmacy has been consulted for unasyn dosing.  Plan: Unasyn 3g x1 followed by 1.5g q12h F/u renal function, infectious work up and length of therapy  Height: 5\' 6"  (167.6 cm) Weight: 70.8 kg (156 lb) IBW/kg (Calculated) : 63.8  Temp (24hrs), Avg:98.8 F (37.1 C), Min:97.9 F (36.6 C), Max:99.6 F (37.6 C)  Recent Labs  Lab 06/02/23 0255 06/03/23 0256 06/04/23 0238 06/08/23 1510 06/08/23 2149  WBC  --   --   --  7.7 7.5  CREATININE 1.96* 2.08* 1.45* 3.91* 3.39*    Estimated Creatinine Clearance: 23 mL/min (A) (by C-G formula based on SCr of 3.39 mg/dL (H)).    Allergies  Allergen Reactions   Egg-Derived Products Nausea And Vomiting   Banana Nausea And Vomiting   Pork-Derived Products Other (See Comments)    No pork products d/t Religious preference     Antimicrobials this admission: Unasyn 1/27 >  Thank you for allowing pharmacy to be a part of this patient's care.  Marja Kays 06/09/2023 12:28 AM

## 2023-06-09 NOTE — Progress Notes (Signed)
PROGRESS NOTE    Charles [name]  UEA:[id number] DOB: 1970/09/10 DOA: 06/08/2023 PCP: Renaye Rakers, MD    Brief Narrative:  53 year old with history of hypertension, chronic systolic heart failure with known ejection fraction of 25 to 30%, COPD, hepatitis C, polysubstance abuse, uses cocaine THC and opiates, smoker, homelessness presented to the emergency room with complaints of chest pain, paranoia.  Apparently he was paranoid on presentation.  He had used cocaine just before coming to the hospital.  He was intoxicated and had multiple complaints.  Subjective: Patient seen and examined in the emergency room.  Patient was sleepy, hard to arouse and then when he awoke patient tells me that he needs more food.  Denies any complaints.  Goes back to sleep.  On room air.   Assessment & Plan:   AKI present on CKD stage IIIb: Baseline creatinine about 1.4 on last admission.  Presented with creatinine of 3.91.  Treated with IV fluids overnight and trending down.  Continue IV fluids today.  Encourage oral intake.  Recheck levels tomorrow morning.  Nontraumatic rhabdomyolysis: Due to drugs, cocaine.  Continue maintenance IV fluids.  Recheck levels tomorrow morning.  Chest pain, mildly elevated troponins: Type II myocardial infarction likely related to cocaine.  Currently chest pain-free.  Troponins downtrending.  Monitor.  Chronic systolic heart failure: Gentle IV fluids for dehydration and rhabdomyolysis.  Holding diuretics.  Polysubstance abuse: High risk withdrawals.  Monitoring.  Suspected aspiration pneumonia: On Unasyn.  Aspiration precautions.  Speech therapy once more awake.   DVT prophylaxis: heparin injection 5,000 Units Start: 06/08/23 2200   Code Status: Full code Family Communication: None at the bedside Disposition Plan: Status is: Inpatient Remains inpatient appropriate because: IV antibiotics, IV fluids     Consultants:  None  Procedures:  None  Antimicrobials:   Unasyn 1/26---     Objective: Vitals:   06/09/23 0500 06/09/23 0600 06/09/23 1120 06/09/23 1245  BP: (!) 149/92 138/82 (!) 138/101   Pulse: 84 86 88 (!) 106  Resp: 17 15 15 16   Temp: 97.9 F (36.6 C)  98 F (36.7 C)   TempSrc:   Oral   SpO2: 100% 97% 100% 97%  Weight:      Height:        Intake/Output Summary (Last 24 hours) at 06/09/2023 1342 Last data filed at 06/09/2023 1123 Gross per 24 hour  Intake 1100 ml  Output 1000 ml  Net 100 ml   Filed Weights   06/08/23 2013  Weight: 70.8 kg    Examination:  General exam: Appears calm and sleepy.  Dishelved Respiratory system: Clear to auscultation.  Conducted upper airway sounds.  Gurgling noise.  Snoring. Cardiovascular system: S1 & S2 heard, RRR.  No pedal edema. Gastrointestinal system: Soft.  Nontender.  Bowel sound present. Central nervous system: Alert and awake on stimulation.  Sleepy.  Unable to participate fully.     Data Reviewed: I have personally reviewed following labs and imaging studies  CBC: Recent Labs  Lab 06/08/23 1510 06/08/23 2149 06/09/23 0300  WBC 7.7 7.5 6.0  NEUTROABS  --   --  3.7  HGB 11.8* 10.3* 11.1*  HCT 35.4* 30.6* 32.8*  MCV 102.6* 101.7* 101.9*  PLT 124* 105* 109*   Basic Metabolic Panel: Recent Labs  Lab 06/03/23 0256 06/04/23 0238 06/08/23 1510 06/08/23 2149 06/09/23 0300  NA 131* 131* 130*  --  132*  K 4.6 4.7 4.6  --  3.9  CL 97* 99 98  --  102  CO2 26 24 17*  --  19*  GLUCOSE 108* 98 128*  --  90  BUN 45* 37* 74*  --  61*  CREATININE 2.08* 1.45* 3.91* 3.39* 2.75*  CALCIUM 9.1 8.7* 8.7*  --  8.3*  MG  --   --   --  2.0 2.2   GFR: Estimated Creatinine Clearance: 28.4 mL/min (A) (by C-G formula based on SCr of 2.75 mg/dL (H)). Liver Function Tests: No results for input(s): "AST", "ALT", "ALKPHOS", "BILITOT", "PROT", "ALBUMIN" in the last 168 hours. No results for input(s): "LIPASE", "AMYLASE" in the last 168 hours. No results for input(s): "AMMONIA" in the  last 168 hours. Coagulation Profile: No results for input(s): "INR", "PROTIME" in the last 168 hours. Cardiac Enzymes: Recent Labs  Lab 06/08/23 1941 06/09/23 0300  CKTOTAL 3,540* 2,582*   BNP (last 3 results) No results for input(s): "PROBNP" in the last 8760 hours. HbA1C: No results for input(s): "HGBA1C" in the last 72 hours. CBG: No results for input(s): "GLUCAP" in the last 168 hours. Lipid Profile: No results for input(s): "CHOL", "HDL", "LDLCALC", "TRIG", "CHOLHDL", "LDLDIRECT" in the last 72 hours. Thyroid Function Tests: No results for input(s): "TSH", "T4TOTAL", "FREET4", "T3FREE", "THYROIDAB" in the last 72 hours. Anemia Panel: No results for input(s): "VITAMINB12", "FOLATE", "FERRITIN", "TIBC", "IRON", "RETICCTPCT" in the last 72 hours. Sepsis Labs: No results for input(s): "PROCALCITON", "LATICACIDVEN" in the last 168 hours.  No results found for this or any previous visit (from the past 240 hours).       Radiology Studies: CT HEAD WO CONTRAST ( ) Result Date: 06/09/2023 CLINICAL DATA:  53 year old male altered mental status. EXAM: CT HEAD WITHOUT CONTRAST TECHNIQUE: Contiguous axial images were obtained from the base of the skull through the vertex without intravenous contrast. RADIATION DOSE REDUCTION: This exam was performed according to the departmental dose-optimization program which includes automated exposure control, adjustment of the mA and/or kV according to patient size and/or use of iterative reconstruction technique. COMPARISON:  Head CT 11/01/2021. brain MRI 06/06/2020. FINDINGS: Brain: Cerebral volume remains normal. No midline shift, ventriculomegaly, mass effect, evidence of mass lesion, intracranial hemorrhage or evidence of cortically based acute infarction. Gray-white matter differentiation is within normal limits throughout the brain. Vascular: Calcified atherosclerosis at the skull base. No suspicious intracranial vascular hyperdensity. Skull:  Chronic left lamina papyracea fracture. Elongated bilateral stylohyoid ligament calcification. Carious maxillary dentition. No acute osseous abnormality identified. Sinuses/Orbits: Stable generally well aerated paranasal sinuses, tympanic cavities, mastoids. Scattered ethmoid mucosal thickening. Other: No acute orbit or scalp soft tissue finding. IMPRESSION: 1. Stable and normal for age noncontrast CT appearance of the brain. 2. Chronic left lamina papyracea fracture. Carious maxillary dentition. Electronically Signed   By: Odessa Fleming M.D.   On: 06/09/2023 04:27   DG CHEST PORT 1 VIEW Result Date: 06/08/2023 CLINICAL DATA:  [id number] Hypoxia [id number] EXAM: PORTABLE CHEST 1 VIEW COMPARISON:  Chest x-ray 06/08/2023, chest x-ray 01/27/2023 FINDINGS: Patient is rotated Cardiomegaly. Nonvisualization of the right heart border may be due to patient rotation. Otherwise heart and mediastinal contours are within normal limits. Question right middle lobe airspace opacity. Mild pulmonary edema. No pleural effusion. No pneumothorax. No acute osseous abnormality. IMPRESSION: 1. Markedly rotated chest x-ray with query developing right middle lobe airspace opacity. 2. Cardiomegaly with mild pulmonary edema. Electronically Signed   By: Tish Frederickson M.D.   On: 06/08/2023 23:58   DG Chest 2 View Result Date: 06/08/2023 CLINICAL DATA:  Chest pain EXAM: CHEST - 2  VIEW COMPARISON:  Chest radiograph 05/29/2023 FINDINGS: Cardiomegaly. Elevation right hemidiaphragm. Pulmonary vascular redistribution and mild interstitial edema. No pleural effusion. Thoracic spine degenerative changes. Coil projects over the left upper quadrant. IMPRESSION: Cardiomegaly with mild interstitial edema. Electronically Signed   By: Annia Belt M.D.   On: 06/08/2023 16:14        Scheduled Meds:  heparin  5,000 Units Subcutaneous Q8H   nicotine  14 mg Transdermal Daily   Continuous Infusions:  sodium chloride     ampicillin-sulbactam (UNASYN) IV  Stopped (06/09/23 1140)     LOS: 1 day    Time spent: 35 minutes    Dorcas Carrow, MD [facility]

## 2023-06-09 NOTE — ED Notes (Signed)
Patient transported to CT via stretcher.

## 2023-06-09 NOTE — ED Notes (Signed)
Dr. Joneen Roach at bedside

## 2023-06-10 DIAGNOSIS — F1910 Other psychoactive substance abuse, uncomplicated: Secondary | ICD-10-CM | POA: Diagnosis not present

## 2023-06-10 DIAGNOSIS — N179 Acute kidney failure, unspecified: Secondary | ICD-10-CM | POA: Diagnosis not present

## 2023-06-10 DIAGNOSIS — N189 Chronic kidney disease, unspecified: Secondary | ICD-10-CM | POA: Diagnosis not present

## 2023-06-10 DIAGNOSIS — M6282 Rhabdomyolysis: Secondary | ICD-10-CM | POA: Diagnosis not present

## 2023-06-10 LAB — COMPREHENSIVE METABOLIC PANEL
ALT: 43 U/L (ref 0–44)
AST: 57 U/L — ABNORMAL HIGH (ref 15–41)
Albumin: 2.8 g/dL — ABNORMAL LOW (ref 3.5–5.0)
Alkaline Phosphatase: 53 U/L (ref 38–126)
Anion gap: 6 (ref 5–15)
BUN: 37 mg/dL — ABNORMAL HIGH (ref 6–20)
CO2: 21 mmol/L — ABNORMAL LOW (ref 22–32)
Calcium: 8.2 mg/dL — ABNORMAL LOW (ref 8.9–10.3)
Chloride: 108 mmol/L (ref 98–111)
Creatinine, Ser: 1.57 mg/dL — ABNORMAL HIGH (ref 0.61–1.24)
GFR, Estimated: 53 mL/min — ABNORMAL LOW (ref >60)
Glucose, Bld: 116 mg/dL — ABNORMAL HIGH (ref 70–99)
Potassium: 3.8 mmol/L (ref 3.5–5.1)
Sodium: 135 mmol/L (ref 135–145)
Total Bilirubin: 1.1 mg/dL (ref 0.0–1.2)
Total Protein: 5.9 g/dL — ABNORMAL LOW (ref 6.5–8.1)

## 2023-06-10 LAB — CBC WITH DIFFERENTIAL/PLATELET
Abs Immature Granulocytes: 0.01 10*3/uL (ref 0.00–0.07)
Basophils Absolute: 0 10*3/uL (ref 0.0–0.1)
Basophils Relative: 0 %
Eosinophils Absolute: 0 10*3/uL (ref 0.0–0.5)
Eosinophils Relative: 1 %
HCT: 32.3 % — ABNORMAL LOW (ref 39.0–52.0)
Hemoglobin: 11 g/dL — ABNORMAL LOW (ref 13.0–17.0)
Immature Granulocytes: 0 %
Lymphocytes Relative: 18 %
Lymphs Abs: 0.7 10*3/uL (ref 0.7–4.0)
MCH: 34.7 pg — ABNORMAL HIGH (ref 26.0–34.0)
MCHC: 34.1 g/dL (ref 30.0–36.0)
MCV: 101.9 fL — ABNORMAL HIGH (ref 80.0–100.0)
Monocytes Absolute: 0.8 10*3/uL (ref 0.1–1.0)
Monocytes Relative: 20 %
Neutro Abs: 2.4 10*3/uL (ref 1.7–7.7)
Neutrophils Relative %: 61 %
Platelets: 114 10*3/uL — ABNORMAL LOW (ref 150–400)
RBC: 3.17 MIL/uL — ABNORMAL LOW (ref 4.22–5.81)
RDW: 17.4 % — ABNORMAL HIGH (ref 11.5–15.5)
WBC: 3.9 10*3/uL — ABNORMAL LOW (ref 4.0–10.5)
nRBC: 0 % (ref 0.0–0.2)

## 2023-06-10 LAB — PHOSPHORUS: Phosphorus: 2.2 mg/dL — ABNORMAL LOW (ref 2.5–4.6)

## 2023-06-10 LAB — MAGNESIUM: Magnesium: 2 mg/dL (ref 1.7–2.4)

## 2023-06-10 LAB — CK: Total CK: 588 U/L — ABNORMAL HIGH (ref 49–397)

## 2023-06-10 MED ORDER — SODIUM CHLORIDE 0.9 % IV SOLN
1.5000 g | Freq: Three times a day (TID) | INTRAVENOUS | Status: DC
  Administered 2023-06-10 – 2023-06-11 (×3): 1.5 g via INTRAVENOUS
  Filled 2023-06-10 (×4): qty 4

## 2023-06-10 MED ORDER — LOSARTAN POTASSIUM 50 MG PO TABS
50.0000 mg | ORAL_TABLET | Freq: Every day | ORAL | Status: DC
  Administered 2023-06-10 – 2023-06-11 (×2): 50 mg via ORAL
  Filled 2023-06-10 (×2): qty 1

## 2023-06-10 NOTE — Progress Notes (Signed)
PROGRESS NOTE    Charles [name]  QMV:[id number] DOB: 1971/03/21 DOA: 06/08/2023 PCP: Renaye Rakers, MD    Brief Narrative:  53 year old with history of hypertension, chronic systolic heart failure with known ejection fraction of 25 to 30%, COPD, hepatitis C, polysubstance abuse, uses cocaine THC and opiates, smoker, homelessness presented to the emergency room with complaints of chest pain, paranoia.  Apparently he was paranoid on presentation.  He had used cocaine just before coming to the hospital.  He was intoxicated and had multiple complaints.  Subjective: Patient seen and examined.  Remains with flat affect and not very forthcoming but looks comfortable.  No other overnight events.  I discussed with him about discharging from the hospital, he tells me he is not ready yet.  Complains of whole body hurting.   Assessment & Plan:   AKI present on CKD stage IIIb: Baseline creatinine about 1.4 on last admission.  Presented with creatinine of 3.91.  Treated with IV fluids and trending down.  Discontinue IV fluids.  Encourage oral intake.    Nontraumatic rhabdomyolysis: Due to drugs, cocaine.  CK levels 1400.  Encourage oral intake.  Discontinue IV fluids.  Chest pain, mildly elevated troponins: Type II myocardial infarction likely related to cocaine.  Currently chest pain-free.  Troponins downtrending.  Monitor.  Chronic systolic heart failure: Gentle IV fluids for dehydration and rhabdomyolysis.  Holding diuretics.  Polysubstance abuse: High risk withdrawals.  Monitoring.  On CIWA protocol.  Suspected aspiration pneumonia: On Unasyn.  Aspiration precautions.  Fairly stable today.  Will continue IV Unasyn today.  Mobilize with PT OT.  Anticipate home tomorrow.   DVT prophylaxis: heparin injection 5,000 Units Start: 06/08/23 2200   Code Status: Full code Family Communication: None at the bedside Disposition Plan: Status is: Inpatient Remains inpatient appropriate because: IV  antibiotics, IV fluids     Consultants:  None  Procedures:  None  Antimicrobials:  Unasyn 1/26---     Objective: Vitals:   06/09/23 1940 06/09/23 2341 06/10/23 0437 06/10/23 0816  BP: (!) 150/101 (!) 105/52 130/83 (!) 158/110  Pulse: 90 (!) 48 90 93  Resp: 18 18 18 17   Temp: 98.9 F (37.2 C) 98.9 F (37.2 C) 98.5 F (36.9 C) 98.3 F (36.8 C)  TempSrc:   Oral   SpO2: 100% 91% 97% 100%  Weight:      Height:        Intake/Output Summary (Last 24 hours) at 06/10/2023 1250 Last data filed at 06/10/2023 1025 Gross per 24 hour  Intake 1567.45 ml  Output 1000 ml  Net 567.45 ml   Filed Weights   06/08/23 2013  Weight: 70.8 kg    Examination:  General exam: Calm and comfortable.  Unhappy, flat affect and not very keen to conversation. Respiratory system: Clear to auscultation.  Conducted upper airway sounds.  Gurgling noise.  Snoring. Cardiovascular system: S1 & S2 heard, RRR.  No pedal edema. Gastrointestinal system: Soft.  Nontender.  Bowel sound present. Central nervous system: Alert and awake on conversation.  Moves all extremities equally.     Data Reviewed: I have personally reviewed following labs and imaging studies  CBC: Recent Labs  Lab 06/08/23 1510 06/08/23 2149 06/09/23 0300 06/10/23 0619  WBC 7.7 7.5 6.0 3.9*  NEUTROABS  --   --  3.7 2.4  HGB 11.8* 10.3* 11.1* 11.0*  HCT 35.4* 30.6* 32.8* 32.3*  MCV 102.6* 101.7* 101.9* 101.9*  PLT 124* 105* 109* 114*   Basic Metabolic Panel: Recent  Labs  Lab 06/04/23 0238 06/08/23 1510 06/08/23 2149 06/09/23 0300 06/10/23 0619  NA 131* 130*  --  132* 135  K 4.7 4.6  --  3.9 3.8  CL 99 98  --  102 108  CO2 24 17*  --  19* 21*  GLUCOSE 98 128*  --  90 116*  BUN 37* 74*  --  61* 37*  CREATININE 1.45* 3.91* 3.39* 2.75* 1.57*  CALCIUM 8.7* 8.7*  --  8.3* 8.2*  MG  --   --  2.0 2.2 2.0  PHOS  --   --   --   --  2.2*   GFR: Estimated Creatinine Clearance: 49.7 mL/min (A) (by C-G formula based on  SCr of 1.57 mg/dL (H)). Liver Function Tests: Recent Labs  Lab 06/10/23 0619  AST 57*  ALT 43  ALKPHOS 53  BILITOT 1.1  PROT 5.9*  ALBUMIN 2.8*   No results for input(s): "LIPASE", "AMYLASE" in the last 168 hours. No results for input(s): "AMMONIA" in the last 168 hours. Coagulation Profile: No results for input(s): "INR", "PROTIME" in the last 168 hours. Cardiac Enzymes: Recent Labs  Lab 06/08/23 1941 06/09/23 0300 06/10/23 0619  CKTOTAL 3,540* 2,582* 588*   BNP (last 3 results) No results for input(s): "PROBNP" in the last 8760 hours. HbA1C: No results for input(s): "HGBA1C" in the last 72 hours. CBG: No results for input(s): "GLUCAP" in the last 168 hours. Lipid Profile: No results for input(s): "CHOL", "HDL", "LDLCALC", "TRIG", "CHOLHDL", "LDLDIRECT" in the last 72 hours. Thyroid Function Tests: No results for input(s): "TSH", "T4TOTAL", "FREET4", "T3FREE", "THYROIDAB" in the last 72 hours. Anemia Panel: No results for input(s): "VITAMINB12", "FOLATE", "FERRITIN", "TIBC", "IRON", "RETICCTPCT" in the last 72 hours. Sepsis Labs: No results for input(s): "PROCALCITON", "LATICACIDVEN" in the last 168 hours.  No results found for this or any previous visit (from the past 240 hours).       Radiology Studies: CT HEAD WO CONTRAST ( ) Result Date: 06/09/2023 CLINICAL DATA:  53 year old male altered mental status. EXAM: CT HEAD WITHOUT CONTRAST TECHNIQUE: Contiguous axial images were obtained from the base of the skull through the vertex without intravenous contrast. RADIATION DOSE REDUCTION: This exam was performed according to the departmental dose-optimization program which includes automated exposure control, adjustment of the mA and/or kV according to patient size and/or use of iterative reconstruction technique. COMPARISON:  Head CT 11/01/2021. brain MRI 06/06/2020. FINDINGS: Brain: Cerebral volume remains normal. No midline shift, ventriculomegaly, mass effect,  evidence of mass lesion, intracranial hemorrhage or evidence of cortically based acute infarction. Gray-white matter differentiation is within normal limits throughout the brain. Vascular: Calcified atherosclerosis at the skull base. No suspicious intracranial vascular hyperdensity. Skull: Chronic left lamina papyracea fracture. Elongated bilateral stylohyoid ligament calcification. Carious maxillary dentition. No acute osseous abnormality identified. Sinuses/Orbits: Stable generally well aerated paranasal sinuses, tympanic cavities, mastoids. Scattered ethmoid mucosal thickening. Other: No acute orbit or scalp soft tissue finding. IMPRESSION: 1. Stable and normal for age noncontrast CT appearance of the brain. 2. Chronic left lamina papyracea fracture. Carious maxillary dentition. Electronically Signed   By: Odessa Fleming M.D.   On: 06/09/2023 04:27   DG CHEST PORT 1 VIEW Result Date: 06/08/2023 CLINICAL DATA:  200808 Hypoxia [id number] EXAM: PORTABLE CHEST 1 VIEW COMPARISON:  Chest x-ray 06/08/2023, chest x-ray 01/27/2023 FINDINGS: Patient is rotated Cardiomegaly. Nonvisualization of the right heart border may be due to patient rotation. Otherwise heart and mediastinal contours are within normal limits. Question right middle lobe  airspace opacity. Mild pulmonary edema. No pleural effusion. No pneumothorax. No acute osseous abnormality. IMPRESSION: 1. Markedly rotated chest x-ray with query developing right middle lobe airspace opacity. 2. Cardiomegaly with mild pulmonary edema. Electronically Signed   By: Tish Frederickson M.D.   On: 06/08/2023 23:58   DG Chest 2 View Result Date: 06/08/2023 CLINICAL DATA:  Chest pain EXAM: CHEST - 2 VIEW COMPARISON:  Chest radiograph 05/29/2023 FINDINGS: Cardiomegaly. Elevation right hemidiaphragm. Pulmonary vascular redistribution and mild interstitial edema. No pleural effusion. Thoracic spine degenerative changes. Coil projects over the left upper quadrant. IMPRESSION: Cardiomegaly  with mild interstitial edema. Electronically Signed   By: Annia Belt M.D.   On: 06/08/2023 16:14        Scheduled Meds:  folic acid  1 mg Oral Daily   heparin  5,000 Units Subcutaneous Q8H   losartan  50 mg Oral Daily   multivitamin with minerals  1 tablet Oral Daily   nicotine  14 mg Transdermal Daily   thiamine  100 mg Oral Daily   Or   thiamine  100 mg Intravenous Daily   Continuous Infusions:  ampicillin-sulbactam (UNASYN) IV 1.5 g (06/10/23 1049)     LOS: 2 days    Time spent: 35 minutes    Dorcas Carrow, MD [facility]

## 2023-06-10 NOTE — Progress Notes (Signed)
PT Cancellation Note  Patient Details Name: Charles [name] MRN: [id number] DOB: Jun 02, 1970   Cancelled Treatment:    Reason Eval/Treat Not Completed: Patient declined, no reason specified  Refused physical therapy assessment. No reason given, only "I ain't doing nothing with you people, give me some food," in aggressive manner.  This was my second attempt today.  Will try again tomorrow if pt willing to participate.  Kathlyn Sacramento, PT, DPT [facility]  Rehabilitation Services Physical Therapist Office: [phone] Website: [email or web address]   Berton Mount 06/10/2023, 10:07 AM

## 2023-06-10 NOTE — Progress Notes (Signed)
Pharmacy Antibiotic Note  Charles [name] is a 53 y.o. male admitted on 06/08/2023 with concern for aspiration pneumonia.  Pharmacy has been consulted for unasyn dosing.  06/10/23:  Scr trending down, now 1.57, CrCl ~ 48 ml/min  Plan: Adjuse Unasyn dose to 1.5 g q8h F/u renal function, infectious work up and length of therapy  Height: 5\' 6"  (167.6 cm) Weight: 70.8 kg (156 lb) IBW/kg (Calculated) : 63.8  Temp (24hrs), Avg:98.4 F (36.9 C), Min:97.8 F (36.6 C), Max:98.9 F (37.2 C)  Recent Labs  Lab 06/04/23 0238 06/08/23 1510 06/08/23 2149 06/09/23 0300 06/10/23 0619  WBC  --  7.7 7.5 6.0 3.9*  CREATININE 1.45* 3.91* 3.39* 2.75* 1.57*    Estimated Creatinine Clearance: 49.7 mL/min (A) (by C-G formula based on SCr of 1.57 mg/dL (H)).    Allergies  Allergen Reactions   Egg-Derived Products Nausea And Vomiting   Banana Nausea And Vomiting   Pork-Derived Products Other (See Comments)    No pork products d/t Religious preference     Antimicrobials this admission: Unasyn 1/27 >  Thank you for allowing pharmacy to be a part of this patient's care.  Noah Delaine, RPh Clinical Pharmacist  06/10/2023 1:46 PM

## 2023-06-10 NOTE — Progress Notes (Signed)
Occupational Therapy Evaluation Patient Details Name: Charles [name] MRN: [id number] DOB: [birth year] Today's Date: 06/10/2023   History of Present Illness Pt is a 53 yr old male who presented 06/08/23 with myotonic twitching and unable to report complaints. He was admitted for possible PNA. Pt found to have nontraumatic rhabdomyolysis and AKI. Pt had a recent admission on 04/14/23 with rhabdomyolysis, AKI superimposed on CKD, and acute on chronic heart failure. PMH includes: HF with EF 25-30% , hep C CHF, polysubstance abuse (cocaine, opiates, THC, benzo's), HTN, CKD, COPD, seizures, sleep apnea, hepatitis, asplastic anemia, and homelessness.   Clinical Impression   Pt at this time was very limited as after providing the pt food and informed they were having a food tray sent to room agreed to work with this therapist. He reported he needed to urinate and was able to complete at EOB with set up. [name] was able to complete all bed mobility with modified independently but once he completed urination said they needed to lay back down. He is reporting he can not get up as he broke his LE in the past and can not do it right now. This therapist attempted to encourage pt to sit up for meal but decline and said "I got it". Will continue to follow to attempt to further assess OOB activity.      If plan is discharge home, recommend the following: A little help with walking and/or transfers;Assistance with cooking/housework;Assist for transportation    Functional Status Assessment  Patient has had a recent decline in their functional status and demonstrates the ability to make significant improvements in function in a reasonable and predictable amount of time.  Equipment Recommendations   (TBD if there are any other needs)    Recommendations for Other Services       Precautions / Restrictions Precautions Precautions: Fall Restrictions Weight Bearing Restrictions Per Provider Order: No       Mobility Bed Mobility Overal bed mobility: Needs Assistance Bed Mobility: Supine to Sit, Sit to Supine     Supine to sit: Modified independent (Device/Increase time) Sit to supine: Modified independent (Device/Increase time)        Transfers                   General transfer comment: pt decline to compelte transfers      Balance Overall balance assessment: Needs assistance Sitting-balance support: Feet supported, No upper extremity supported Sitting balance-Leahy Scale: Good     Standing balance support:  (declined stadning)                               ADL either performed or assessed with clinical judgement   ADL Overall ADL's : Needs assistance/impaired Eating/Feeding: Set up;Sitting   Grooming: Wash/dry hands;Set up;Sitting   Upper Body Bathing: Set up;Sitting                   Toileting- Clothing Manipulation and Hygiene: Set up         General ADL Comments: tasks were simulated and then was unable to fully assess as they declined     Vision         Perception         Praxis         Pertinent Vitals/Pain Pain Assessment Pain Assessment: Faces Faces Pain Scale: No hurt Breathing: normal Negative Vocalization: none Facial Expression: smiling or inexpressive Body Language:  relaxed Consolability: no need to console PAINAD Score: 0 Pain Location: none but commented about edma in R hand     Extremity/Trunk Assessment Upper Extremity Assessment Upper Extremity Assessment: Right hand dominant;RUE deficits/detail RUE Deficits / Details: noted edema in r hand but also noted to have band on arm that is the pt's and asked about removal but declinined RUE Sensation: WNL RUE Coordination: decreased fine motor   Lower Extremity Assessment Lower Extremity Assessment: Defer to PT evaluation   Cervical / Trunk Assessment Cervical / Trunk Assessment: Kyphotic   Communication Communication Communication: No apparent  difficulties   Cognition Arousal: Alert Behavior During Therapy: Agitated Overall Cognitive Status: Difficult to assess                                 General Comments: Pt very irritated in session about their food. Pt was brought food but did not eat and then recieved food in session but wanted to lay back down. Attempted to have pt back up in a sitting position while reating but refused.     General Comments       Exercises     Shoulder Instructions      Home Living Family/patient expects to be discharged to:: Shelter/Homeless                                 Additional Comments: Pt did not go into detial but reported homeless at this time. He reported he had to move out were they were before.      Prior Functioning/Environment Prior Level of Function : Independent/Modified Independent             Mobility Comments: Pt reported they were "getting around" but did not report on how ADLs Comments: Pt did not report on how they compelted ADLS prior        OT Problem List: Decreased activity tolerance;Impaired balance (sitting and/or standing);Decreased safety awareness      OT Treatment/Interventions: Self-care/ADL training;Therapeutic activities;Patient/family education;Balance training    OT Goals(Current goals can be found in the care plan section) Acute Rehab OT Goals Patient Stated Goal: to eat OT Goal Formulation: With patient Time For Goal Achievement: 06/24/23 Potential to Achieve Goals: Good  OT Frequency: Min 1X/week    Co-evaluation              AM-PAC OT "6 Clicks" Daily Activity     Outcome Measure Help from another person eating meals?: None Help from another person taking care of personal grooming?: A Little Help from another person toileting, which includes using toliet, bedpan, or urinal?: A Little Help from another person bathing (including washing, rinsing, drying)?: A Little Help from another person to put on  and taking off regular upper body clothing?: A Little Help from another person to put on and taking off regular lower body clothing?: A Little 6 Click Score: 19   End of Session Equipment Utilized During Treatment: Gait belt Nurse Communication: Mobility status  Activity Tolerance: Other (comment) (pt reported they could not do any more) Patient left: in bed;with call bell/phone within reach;with bed alarm set  OT Visit Diagnosis: Unsteadiness on feet (R26.81);Muscle weakness (generalized) (M62.81)                Time: 1610-9604 OT Time Calculation (min): 15 min Charges:  OT General Charges $OT Visit: 1 Visit  OT Evaluation $OT Eval Low Complexity: 1 Low  Presley Raddle OTR/L  Acute Rehab Services  [phone] office number   Alphia Moh 06/10/2023, 12:07 PM

## 2023-06-11 ENCOUNTER — Other Ambulatory Visit (HOSPITAL_COMMUNITY): Payer: Self-pay

## 2023-06-11 DIAGNOSIS — F1910 Other psychoactive substance abuse, uncomplicated: Secondary | ICD-10-CM | POA: Diagnosis not present

## 2023-06-11 DIAGNOSIS — N189 Chronic kidney disease, unspecified: Secondary | ICD-10-CM | POA: Diagnosis not present

## 2023-06-11 DIAGNOSIS — M6282 Rhabdomyolysis: Secondary | ICD-10-CM | POA: Diagnosis not present

## 2023-06-11 DIAGNOSIS — N179 Acute kidney failure, unspecified: Secondary | ICD-10-CM | POA: Diagnosis not present

## 2023-06-11 MED ORDER — TORSEMIDE 100 MG PO TABS
100.0000 mg | ORAL_TABLET | Freq: Every day | ORAL | 0 refills | Status: DC
  Filled 2023-06-11: qty 90, 90d supply, fill #0

## 2023-06-11 MED ORDER — ALBUTEROL SULFATE HFA 108 (90 BASE) MCG/ACT IN AERS
2.0000 | INHALATION_SPRAY | RESPIRATORY_TRACT | 2 refills | Status: DC | PRN
  Filled 2023-06-11: qty 6.7, 17d supply, fill #0

## 2023-06-11 MED ORDER — AMOXICILLIN-POT CLAVULANATE 875-125 MG PO TABS
1.0000 | ORAL_TABLET | Freq: Two times a day (BID) | ORAL | 0 refills | Status: AC
  Filled 2023-06-11: qty 8, 4d supply, fill #0

## 2023-06-11 MED ORDER — SPIRONOLACTONE 25 MG PO TABS
25.0000 mg | ORAL_TABLET | Freq: Every day | ORAL | 0 refills | Status: DC
  Filled 2023-06-11: qty 30, 30d supply, fill #0

## 2023-06-11 MED ORDER — LOSARTAN POTASSIUM 25 MG PO TABS
25.0000 mg | ORAL_TABLET | Freq: Every day | ORAL | 0 refills | Status: DC
  Filled 2023-06-11: qty 30, 30d supply, fill #0

## 2023-06-11 MED ORDER — GABAPENTIN 100 MG PO CAPS
100.0000 mg | ORAL_CAPSULE | Freq: Two times a day (BID) | ORAL | 0 refills | Status: DC
  Filled 2023-06-11: qty 180, 90d supply, fill #0

## 2023-06-11 NOTE — Discharge Summary (Signed)
Physician Discharge Summary  Charles [name] NGE:[id number] DOB: 04-12-1971 DOA: 06/08/2023  PCP: Renaye Rakers, MD  Admit date: 06/08/2023 Discharge date: 06/11/2023  Admitted From: Home Disposition: Home, homeless shelter  Recommendations for Outpatient Follow-up:  Follow up with PCP in 1-2 weeks Please obtain BMP/CBC in one week   Discharge Condition: Stable CODE STATUS: Full code Diet recommendation: Low-salt diet  Discharge summary: 53 year old with history of hypertension, chronic systolic heart failure with known ejection fraction of 25 to 30%, COPD, hepatitis C, polysubstance abuse, uses cocaine THC and opiates, smoker, homelessness presented to the emergency room with complaints of chest pain, paranoia. Apparently he was paranoid on presentation. He had used cocaine just before coming to the hospital. He was intoxicated and had multiple complaints.  Patient was treated for acute kidney injury, nontraumatic rhabdomyolysis, cocaine induced muscle pain and altered mental status.  Clinically improved today.  Treated for following conditions.  AKI present on CKD stage IIIb: Baseline creatinine about 1.4 on last admission.  Presented with creatinine of 3.91.  Treated with IV fluids and improved.  Last 24 hours, he is able to keep up with oral hydration and has adequately improved renal functions.   Nontraumatic rhabdomyolysis: Due to drugs, cocaine.  CK levels trending down.  Clinically improving.   Chest pain, mildly elevated troponins: Type II myocardial infarction likely related to cocaine.  Currently chest pain-free.  Troponins downtrending.     Chronic systolic heart failure: Known ejection fraction 25 to 30%.  Treated with IV fluids.  Currently euvolemic.  Resume GDMT including losartan, spironolactone, torsemide.  No beta-blockers due to active cocaine use.   Polysubstance abuse: Obtunded on presentation.  Secondary to cocaine use.  Counseled extensively.   Suspected aspiration  pneumonia: was On Unasyn.  Aspiration precautions.  No pulmonary symptoms today.  Completed 3 days of Unasyn.  Will continue 4 more days of Augmentin.  Homelessness, very high risk of readmissions.  Extensive counseling done.  Medication provided through [facility] pharmacy.  Stable to discharge home.     Discharge Diagnoses:  Principal Problem:   Rhabdomyolysis Active Problems:   Acute kidney injury superimposed on chronic kidney disease (HCC)   Polysubstance abuse (HCC)   COPD (chronic obstructive pulmonary disease) (HCC)   Prolonged QT interval   HTN (hypertension)   Tobacco abuse   Chronic hepatitis C (HCC)   Homelessness   Chest pain   Acute on chronic HFrEF (heart failure with reduced ejection fraction) [facility])    Discharge Instructions  Discharge Instructions     Diet - low sodium heart healthy   Complete by: As directed    Increase activity slowly   Complete by: As directed       Allergies as of 06/11/2023       Reactions   Egg-derived Products Nausea And Vomiting   Banana Nausea And Vomiting   Pork-derived Products Other (See Comments)   No pork products d/t Religious preference         Medication List     STOP taking these medications    empagliflozin 10 MG Tabs tablet Commonly known as: Jardiance   furosemide 40 MG tablet Commonly known as: LASIX       TAKE these medications    albuterol 108 (90 Base) MCG/ACT inhaler Commonly known as: VENTOLIN HFA Inhale 2 puffs into the lungs every 4 (four) hours as needed for wheezing or shortness of breath.   amoxicillin-clavulanate 875-125 MG tablet Commonly known as: AUGMENTIN Take 1 tablet by  mouth 2 (two) times daily for 4 days.   gabapentin 100 MG capsule Commonly known as: NEURONTIN Take 1 capsule (100 mg total) by mouth 2 (two) times daily.   losartan 25 MG tablet Commonly known as: COZAAR Take 1 tablet (25 mg total) by mouth daily.   spironolactone 25 MG tablet Commonly known as:  ALDACTONE Take 1 tablet (25 mg total) by mouth daily. What changed: when to take this   torsemide 100 MG tablet Commonly known as: DEMADEX Take 1 tablet (100 mg total) by mouth daily.        Allergies  Allergen Reactions   Egg-Derived Products Nausea And Vomiting   Banana Nausea And Vomiting   Pork-Derived Products Other (See Comments)    No pork products d/t Religious preference     Consultations: None   Procedures/Studies: CT HEAD WO CONTRAST ( ) Result Date: 06/09/2023 CLINICAL DATA:  53 year old male altered mental status. EXAM: CT HEAD WITHOUT CONTRAST TECHNIQUE: Contiguous axial images were obtained from the base of the skull through the vertex without intravenous contrast. RADIATION DOSE REDUCTION: This exam was performed according to the departmental dose-optimization program which includes automated exposure control, adjustment of the mA and/or kV according to patient size and/or use of iterative reconstruction technique. COMPARISON:  Head CT 11/01/2021. brain MRI 06/06/2020. FINDINGS: Brain: Cerebral volume remains normal. No midline shift, ventriculomegaly, mass effect, evidence of mass lesion, intracranial hemorrhage or evidence of cortically based acute infarction. Gray-white matter differentiation is within normal limits throughout the brain. Vascular: Calcified atherosclerosis at the skull base. No suspicious intracranial vascular hyperdensity. Skull: Chronic left lamina papyracea fracture. Elongated bilateral stylohyoid ligament calcification. Carious maxillary dentition. No acute osseous abnormality identified. Sinuses/Orbits: Stable generally well aerated paranasal sinuses, tympanic cavities, mastoids. Scattered ethmoid mucosal thickening. Other: No acute orbit or scalp soft tissue finding. IMPRESSION: 1. Stable and normal for age noncontrast CT appearance of the brain. 2. Chronic left lamina papyracea fracture. Carious maxillary dentition. Electronically Signed   By: Odessa Fleming M.D.   On: 06/09/2023 04:27   DG CHEST PORT 1 VIEW Result Date: 06/08/2023 CLINICAL DATA:  [id number] Hypoxia [id number] EXAM: PORTABLE CHEST 1 VIEW COMPARISON:  Chest x-ray 06/08/2023, chest x-ray 01/27/2023 FINDINGS: Patient is rotated Cardiomegaly. Nonvisualization of the right heart border may be due to patient rotation. Otherwise heart and mediastinal contours are within normal limits. Question right middle lobe airspace opacity. Mild pulmonary edema. No pleural effusion. No pneumothorax. No acute osseous abnormality. IMPRESSION: 1. Markedly rotated chest x-ray with query developing right middle lobe airspace opacity. 2. Cardiomegaly with mild pulmonary edema. Electronically Signed   By: Tish Frederickson M.D.   On: 06/08/2023 23:58   DG Chest 2 View Result Date: 06/08/2023 CLINICAL DATA:  Chest pain EXAM: CHEST - 2 VIEW COMPARISON:  Chest radiograph 05/29/2023 FINDINGS: Cardiomegaly. Elevation right hemidiaphragm. Pulmonary vascular redistribution and mild interstitial edema. No pleural effusion. Thoracic spine degenerative changes. Coil projects over the left upper quadrant. IMPRESSION: Cardiomegaly with mild interstitial edema. Electronically Signed   By: Annia Belt M.D.   On: 06/08/2023 16:14   DG Chest Port 1 View Result Date: 05/29/2023 CLINICAL DATA:  Shortness of breath. EXAM: PORTABLE CHEST 1 VIEW COMPARISON:  Chest radiograph dated May 08, 2023. FINDINGS: Cardiomegaly with pulmonary vascular congestion. No overt pulmonary edema. No focal consolidation, sizeable pleural effusion, or pneumothorax. No acute osseous abnormality. IMPRESSION: Cardiomegaly with pulmonary vascular congestion. No focal consolidation. Electronically Signed   By: Hart Robinsons M.D.   On: 05/29/2023  11:52   (Echo, Carotid, EGD, Colonoscopy, ERCP)    Subjective: Patient seen and examined.  Denies any complaints.  Blood pressures are elevated today.  He does have some body ache and was expecting if I can give him  some opiate prescriptions, opiate prescriptions not given in a patient with active drug use with risk of abuse.   Discharge Exam: Vitals:   06/11/23 0923 06/11/23 0929  BP: (!) 170/101 (!) 171/112  Pulse: 62 88  Resp:  16  Temp: 98.8 F (37.1 C) (!) 96.8 F (36 C)  SpO2: 98% 99%   Vitals:   06/10/23 1918 06/11/23 0505 06/11/23 0923 06/11/23 0929  BP: (!) 147/90 (!) 173/112 (!) 170/101 (!) 171/112  Pulse: 94 90 62 88  Resp: 18 18  16   Temp: 99.2 F (37.3 C) 99 F (37.2 C) 98.8 F (37.1 C) (!) 96.8 F (36 C)  TempSrc:   Oral   SpO2: 99% 97% 98% 99%  Weight:      Height:        General: Pt is alert, awake, not in acute distress Cardiovascular: RRR, S1/S2 +, no rubs, no gallops Respiratory: CTA bilaterally, no wheezing, no rhonchi Abdominal: Soft, NT, ND, bowel sounds + Extremities: no edema, no cyanosis    The results of significant diagnostics from this hospitalization (including imaging, microbiology, ancillary and laboratory) are listed below for reference.     Microbiology: No results found for this or any previous visit (from the past 240 hours).   Labs: BNP (last 3 results) Recent Labs    05/08/23 2100 05/29/23 1151 05/30/23 0252  BNP 1,836.7* 2,683.3* 1,451.9*   Basic Metabolic Panel: Recent Labs  Lab 06/08/23 1510 06/08/23 2149 06/09/23 0300 06/10/23 0619  NA 130*  --  132* 135  K 4.6  --  3.9 3.8  CL 98  --  102 108  CO2 17*  --  19* 21*  GLUCOSE 128*  --  90 116*  BUN 74*  --  61* 37*  CREATININE 3.91* 3.39* 2.75* 1.57*  CALCIUM 8.7*  --  8.3* 8.2*  MG  --  2.0 2.2 2.0  PHOS  --   --   --  2.2*   Liver Function Tests: Recent Labs  Lab 06/10/23 0619  AST 57*  ALT 43  ALKPHOS 53  BILITOT 1.1  PROT 5.9*  ALBUMIN 2.8*   No results for input(s): "LIPASE", "AMYLASE" in the last 168 hours. No results for input(s): "AMMONIA" in the last 168 hours. CBC: Recent Labs  Lab 06/08/23 1510 06/08/23 2149 06/09/23 0300 06/10/23 0619   WBC 7.7 7.5 6.0 3.9*  NEUTROABS  --   --  3.7 2.4  HGB 11.8* 10.3* 11.1* 11.0*  HCT 35.4* 30.6* 32.8* 32.3*  MCV 102.6* 101.7* 101.9* 101.9*  PLT 124* 105* 109* 114*   Cardiac Enzymes: Recent Labs  Lab 06/08/23 1941 06/09/23 0300 06/10/23 0619  CKTOTAL 3,540* 2,582* 588*   BNP: Invalid input(s): "POCBNP" CBG: No results for input(s): "GLUCAP" in the last 168 hours. D-Dimer No results for input(s): "DDIMER" in the last 72 hours. Hgb A1c No results for input(s): "HGBA1C" in the last 72 hours. Lipid Profile No results for input(s): "CHOL", "HDL", "LDLCALC", "TRIG", "CHOLHDL", "LDLDIRECT" in the last 72 hours. Thyroid function studies No results for input(s): "TSH", "T4TOTAL", "T3FREE", "THYROIDAB" in the last 72 hours.  Invalid input(s): "FREET3" Anemia work up No results for input(s): "VITAMINB12", "FOLATE", "FERRITIN", "TIBC", "IRON", "RETICCTPCT" in the last 72 hours.  Urinalysis    Component Value Date/Time   COLORURINE YELLOW 06/08/2023 2138   APPEARANCEUR CLEAR 06/08/2023 2138   LABSPEC 1.013 06/08/2023 2138   PHURINE 5.0 06/08/2023 2138   GLUCOSEU NEGATIVE 06/08/2023 2138   HGBUR LARGE (A) 06/08/2023 2138   BILIRUBINUR NEGATIVE 06/08/2023 2138   KETONESUR NEGATIVE 06/08/2023 2138   PROTEINUR 30 (A) 06/08/2023 2138   UROBILINOGEN 1.0 03/10/2015 1203   NITRITE NEGATIVE 06/08/2023 2138   LEUKOCYTESUR NEGATIVE 06/08/2023 2138   Sepsis Labs Recent Labs  Lab 06/08/23 1510 06/08/23 2149 06/09/23 0300 06/10/23 0619  WBC 7.7 7.5 6.0 3.9*   Microbiology No results found for this or any previous visit (from the past 240 hours).   Time coordinating discharge: 40 minutes  SIGNED:   Dorcas Carrow, MD  [facility] 06/11/2023, 10:07 AM

## 2023-06-11 NOTE — Plan of Care (Signed)

## 2023-06-20 ENCOUNTER — Emergency Department (HOSPITAL_COMMUNITY): Payer: Medicare HMO

## 2023-06-20 ENCOUNTER — Other Ambulatory Visit: Payer: Self-pay

## 2023-06-20 ENCOUNTER — Encounter (HOSPITAL_COMMUNITY): Payer: Self-pay | Admitting: Emergency Medicine

## 2023-06-20 ENCOUNTER — Inpatient Hospital Stay (HOSPITAL_COMMUNITY)
Admission: EM | Admit: 2023-06-20 | Discharge: 2023-06-24 | DRG: 281 | Disposition: A | Discharge status: Home / Self Care | Payer: Medicare HMO | Attending: Student | Admitting: Student

## 2023-06-20 DIAGNOSIS — Z91012 Allergy to eggs: Secondary | ICD-10-CM

## 2023-06-20 DIAGNOSIS — I509 Heart failure, unspecified: Secondary | ICD-10-CM | POA: Diagnosis not present

## 2023-06-20 DIAGNOSIS — T502X5A Adverse effect of carbonic-anhydrase inhibitors, benzothiadiazides and other diuretics, initial encounter: Secondary | ICD-10-CM | POA: Diagnosis present

## 2023-06-20 DIAGNOSIS — I21A1 Myocardial infarction type 2: Secondary | ICD-10-CM | POA: Diagnosis present

## 2023-06-20 DIAGNOSIS — I110 Hypertensive heart disease with heart failure: Secondary | ICD-10-CM | POA: Diagnosis not present

## 2023-06-20 DIAGNOSIS — D649 Anemia, unspecified: Secondary | ICD-10-CM | POA: Diagnosis present

## 2023-06-20 DIAGNOSIS — D619 Aplastic anemia, unspecified: Secondary | ICD-10-CM | POA: Diagnosis present

## 2023-06-20 DIAGNOSIS — I5082 Biventricular heart failure: Secondary | ICD-10-CM | POA: Diagnosis present

## 2023-06-20 DIAGNOSIS — R0989 Other specified symptoms and signs involving the circulatory and respiratory systems: Secondary | ICD-10-CM | POA: Diagnosis not present

## 2023-06-20 DIAGNOSIS — Z801 Family history of malignant neoplasm of trachea, bronchus and lung: Secondary | ICD-10-CM

## 2023-06-20 DIAGNOSIS — R0902 Hypoxemia: Secondary | ICD-10-CM | POA: Diagnosis not present

## 2023-06-20 DIAGNOSIS — M545 Low back pain, unspecified: Secondary | ICD-10-CM | POA: Diagnosis present

## 2023-06-20 DIAGNOSIS — I130 Hypertensive heart and chronic kidney disease with heart failure and stage 1 through stage 4 chronic kidney disease, or unspecified chronic kidney disease: Principal | ICD-10-CM | POA: Diagnosis present

## 2023-06-20 DIAGNOSIS — G4730 Sleep apnea, unspecified: Secondary | ICD-10-CM | POA: Diagnosis present

## 2023-06-20 DIAGNOSIS — D702 Other drug-induced agranulocytosis: Secondary | ICD-10-CM | POA: Diagnosis present

## 2023-06-20 DIAGNOSIS — I10 Essential (primary) hypertension: Secondary | ICD-10-CM | POA: Diagnosis not present

## 2023-06-20 DIAGNOSIS — Z5941 Food insecurity: Secondary | ICD-10-CM

## 2023-06-20 DIAGNOSIS — J189 Pneumonia, unspecified organism: Secondary | ICD-10-CM | POA: Diagnosis not present

## 2023-06-20 DIAGNOSIS — Z1152 Encounter for screening for COVID-19: Secondary | ICD-10-CM | POA: Diagnosis not present

## 2023-06-20 DIAGNOSIS — F1410 Cocaine abuse, uncomplicated: Secondary | ICD-10-CM | POA: Diagnosis present

## 2023-06-20 DIAGNOSIS — R0689 Other abnormalities of breathing: Secondary | ICD-10-CM | POA: Diagnosis not present

## 2023-06-20 DIAGNOSIS — Z79899 Other long term (current) drug therapy: Secondary | ICD-10-CM

## 2023-06-20 DIAGNOSIS — J449 Chronic obstructive pulmonary disease, unspecified: Secondary | ICD-10-CM | POA: Diagnosis present

## 2023-06-20 DIAGNOSIS — F17210 Nicotine dependence, cigarettes, uncomplicated: Secondary | ICD-10-CM | POA: Diagnosis present

## 2023-06-20 DIAGNOSIS — Z91018 Allergy to other foods: Secondary | ICD-10-CM

## 2023-06-20 DIAGNOSIS — Z91014 Allergy to mammalian meats: Secondary | ICD-10-CM

## 2023-06-20 DIAGNOSIS — Z7984 Long term (current) use of oral hypoglycemic drugs: Secondary | ICD-10-CM

## 2023-06-20 DIAGNOSIS — R0600 Dyspnea, unspecified: Secondary | ICD-10-CM | POA: Diagnosis not present

## 2023-06-20 DIAGNOSIS — M19072 Primary osteoarthritis, left ankle and foot: Secondary | ICD-10-CM | POA: Diagnosis present

## 2023-06-20 DIAGNOSIS — M1612 Unilateral primary osteoarthritis, left hip: Secondary | ICD-10-CM | POA: Diagnosis not present

## 2023-06-20 DIAGNOSIS — R451 Restlessness and agitation: Secondary | ICD-10-CM | POA: Diagnosis present

## 2023-06-20 DIAGNOSIS — E8809 Other disorders of plasma-protein metabolism, not elsewhere classified: Secondary | ICD-10-CM | POA: Diagnosis present

## 2023-06-20 DIAGNOSIS — D6959 Other secondary thrombocytopenia: Secondary | ICD-10-CM | POA: Diagnosis present

## 2023-06-20 DIAGNOSIS — Z9889 Other specified postprocedural states: Secondary | ICD-10-CM

## 2023-06-20 DIAGNOSIS — Z5901 Sheltered homelessness: Secondary | ICD-10-CM

## 2023-06-20 DIAGNOSIS — I5043 Acute on chronic combined systolic (congestive) and diastolic (congestive) heart failure: Secondary | ICD-10-CM | POA: Diagnosis present

## 2023-06-20 DIAGNOSIS — Z8701 Personal history of pneumonia (recurrent): Secondary | ICD-10-CM

## 2023-06-20 DIAGNOSIS — F1210 Cannabis abuse, uncomplicated: Secondary | ICD-10-CM | POA: Diagnosis present

## 2023-06-20 DIAGNOSIS — R7989 Other specified abnormal findings of blood chemistry: Secondary | ICD-10-CM | POA: Diagnosis not present

## 2023-06-20 DIAGNOSIS — R9431 Abnormal electrocardiogram [ECG] [EKG]: Secondary | ICD-10-CM | POA: Diagnosis present

## 2023-06-20 DIAGNOSIS — Z800 Family history of malignant neoplasm of digestive organs: Secondary | ICD-10-CM | POA: Diagnosis not present

## 2023-06-20 DIAGNOSIS — Z91199 Patient's noncompliance with other medical treatment and regimen due to unspecified reason: Secondary | ICD-10-CM

## 2023-06-20 DIAGNOSIS — I517 Cardiomegaly: Secondary | ICD-10-CM | POA: Diagnosis not present

## 2023-06-20 DIAGNOSIS — Z91148 Patient's other noncompliance with medication regimen for other reason: Secondary | ICD-10-CM

## 2023-06-20 DIAGNOSIS — N1831 Chronic kidney disease, stage 3a: Secondary | ICD-10-CM | POA: Diagnosis present

## 2023-06-20 DIAGNOSIS — Z5982 Transportation insecurity: Secondary | ICD-10-CM

## 2023-06-20 DIAGNOSIS — Z7151 Drug abuse counseling and surveillance of drug abuser: Secondary | ICD-10-CM

## 2023-06-20 DIAGNOSIS — Z8619 Personal history of other infectious and parasitic diseases: Secondary | ICD-10-CM

## 2023-06-20 DIAGNOSIS — Z7189 Other specified counseling: Secondary | ICD-10-CM

## 2023-06-20 DIAGNOSIS — N179 Acute kidney failure, unspecified: Secondary | ICD-10-CM | POA: Diagnosis present

## 2023-06-20 DIAGNOSIS — M47816 Spondylosis without myelopathy or radiculopathy, lumbar region: Secondary | ICD-10-CM | POA: Diagnosis not present

## 2023-06-20 LAB — CBC WITH DIFFERENTIAL/PLATELET
Abs Immature Granulocytes: 0.02 10*3/uL (ref 0.00–0.07)
Basophils Absolute: 0 10*3/uL (ref 0.0–0.1)
Basophils Relative: 0 %
Eosinophils Absolute: 0 10*3/uL (ref 0.0–0.5)
Eosinophils Relative: 0 %
HCT: 37.3 % — ABNORMAL LOW (ref 39.0–52.0)
Hemoglobin: 11.9 g/dL — ABNORMAL LOW (ref 13.0–17.0)
Immature Granulocytes: 0 %
Lymphocytes Relative: 15 %
Lymphs Abs: 0.8 10*3/uL (ref 0.7–4.0)
MCH: 33.5 pg (ref 26.0–34.0)
MCHC: 31.9 g/dL (ref 30.0–36.0)
MCV: 105.1 fL — ABNORMAL HIGH (ref 80.0–100.0)
Monocytes Absolute: 0.3 10*3/uL (ref 0.1–1.0)
Monocytes Relative: 6 %
Neutro Abs: 3.9 10*3/uL (ref 1.7–7.7)
Neutrophils Relative %: 79 %
Platelets: 122 10*3/uL — ABNORMAL LOW (ref 150–400)
RBC: 3.55 MIL/uL — ABNORMAL LOW (ref 4.22–5.81)
RDW: 18.8 % — ABNORMAL HIGH (ref 11.5–15.5)
WBC: 5 10*3/uL (ref 4.0–10.5)
nRBC: 0 % (ref 0.0–0.2)

## 2023-06-20 LAB — COMPREHENSIVE METABOLIC PANEL
ALT: 32 U/L (ref 0–44)
AST: 37 U/L (ref 15–41)
Albumin: 3.1 g/dL — ABNORMAL LOW (ref 3.5–5.0)
Alkaline Phosphatase: 61 U/L (ref 38–126)
Anion gap: 9 (ref 5–15)
BUN: 13 mg/dL (ref 6–20)
CO2: 25 mmol/L (ref 22–32)
Calcium: 8.8 mg/dL — ABNORMAL LOW (ref 8.9–10.3)
Chloride: 105 mmol/L (ref 98–111)
Creatinine, Ser: 1.43 mg/dL — ABNORMAL HIGH (ref 0.61–1.24)
GFR, Estimated: 59 mL/min — ABNORMAL LOW (ref >60)
Glucose, Bld: 93 mg/dL (ref 70–99)
Potassium: 4.3 mmol/L (ref 3.5–5.1)
Sodium: 139 mmol/L (ref 135–145)
Total Bilirubin: 1 mg/dL (ref 0.0–1.2)
Total Protein: 6.6 g/dL (ref 6.5–8.1)

## 2023-06-20 LAB — TROPONIN I (HIGH SENSITIVITY)
Troponin I (High Sensitivity): 79 ng/L — ABNORMAL HIGH (ref <18)
Troponin I (High Sensitivity): 81 ng/L — ABNORMAL HIGH (ref <18)

## 2023-06-20 LAB — RESP PANEL BY RT-PCR (RSV, FLU A&B, COVID)  RVPGX2
Influenza A by PCR: NEGATIVE
Influenza B by PCR: NEGATIVE
Resp Syncytial Virus by PCR: NEGATIVE
SARS Coronavirus 2 by RT PCR: NEGATIVE

## 2023-06-20 LAB — BRAIN NATRIURETIC PEPTIDE: B Natriuretic Peptide: 2508.3 pg/mL — ABNORMAL HIGH (ref 0.0–100.0)

## 2023-06-20 MED ORDER — FUROSEMIDE 10 MG/ML IJ SOLN
100.0000 mg | Freq: Two times a day (BID) | INTRAVENOUS | Status: DC
  Administered 2023-06-21 (×2): 100 mg via INTRAVENOUS
  Filled 2023-06-20 (×4): qty 10

## 2023-06-20 MED ORDER — LOSARTAN POTASSIUM 25 MG PO TABS
25.0000 mg | ORAL_TABLET | Freq: Every day | ORAL | Status: DC
  Administered 2023-06-20 – 2023-06-22 (×3): 25 mg via ORAL
  Filled 2023-06-20 (×3): qty 1

## 2023-06-20 MED ORDER — LORAZEPAM 2 MG/ML IJ SOLN: 1.0000 mg | Freq: Four times a day (QID) | INTRAMUSCULAR | Status: DC | PRN

## 2023-06-20 MED ORDER — ONDANSETRON HCL 4 MG/2ML IJ SOLN: 4.0000 mg | Freq: Four times a day (QID) | INTRAMUSCULAR | Status: DC | PRN

## 2023-06-20 MED ORDER — MELATONIN 3 MG PO TABS
3.0000 mg | ORAL_TABLET | Freq: Every evening | ORAL | Status: DC | PRN
  Administered 2023-06-21 – 2023-06-23 (×3): 3 mg via ORAL
  Filled 2023-06-20 (×3): qty 1

## 2023-06-20 MED ORDER — FUROSEMIDE 10 MG/ML IJ SOLN
160.0000 mg | Freq: Once | INTRAVENOUS | Status: AC
  Administered 2023-06-20: 160 mg via INTRAVENOUS
  Filled 2023-06-20: qty 10
  Filled 2023-06-20: qty 16

## 2023-06-20 MED ORDER — ACETAMINOPHEN 650 MG RE SUPP: 650.0000 mg | Freq: Four times a day (QID) | RECTAL | Status: DC | PRN

## 2023-06-20 MED ORDER — LABETALOL HCL 5 MG/ML IV SOLN: 5.0000 mg | INTRAVENOUS | Status: DC | PRN

## 2023-06-20 MED ORDER — LORAZEPAM 2 MG/ML IJ SOLN
1.0000 mg | Freq: Once | INTRAMUSCULAR | Status: AC
  Administered 2023-06-20: 1 mg via INTRAVENOUS
  Filled 2023-06-20: qty 1

## 2023-06-20 MED ORDER — ACETAMINOPHEN 325 MG PO TABS
650.0000 mg | ORAL_TABLET | Freq: Four times a day (QID) | ORAL | Status: DC | PRN
  Administered 2023-06-22: 650 mg via ORAL
  Filled 2023-06-20: qty 2

## 2023-06-20 NOTE — ED Notes (Signed)
 Patient continues to ask for water, and food.

## 2023-06-20 NOTE — ED Notes (Addendum)
 Patient requested EMS IV to be removed because it hurt, and wants IV to put another IV in.  IV removed and IV team consulted. Patient also demands a cup of ice.  Patient also states he does not want his nose swabbed at this time.
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
## 2023-06-20 NOTE — ED Notes (Signed)
 Pt was receiving lasix  infusion when pump stopped running d/t occlusion alarm. Upon assessment, pts IV was kinked and is no longer functioning properly. Due to difficulty obtaining IV access by dayshift, this RN placed IV team consult for new peripheral access. Lasix  infusion paused at this time due to loss of access.

## 2023-06-20 NOTE — ED Notes (Signed)
 Patient continues to remove oxygen and monitoring equipment.

## 2023-06-20 NOTE — ED Triage Notes (Signed)
 Patient BIB [facility] c/o shortness of breath.  Patient states that he's swollen all over, but refuses to take his lasix .  Patient states I don't want to answer any more questions, I can't breathe, give me some blankets and the remote and lay my head down so I can go to sleep. Patient has tachypnea but otherwise stable.

## 2023-06-20 NOTE — ED Notes (Signed)
 This RN unable to locate lasix  infusion, per pharmacy medication was sent to ED but this RN is unable to locate medication in department. Pharmacy to resend infusion at this time.
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
## 2023-06-20 NOTE — ED Notes (Signed)
 Patient found standing at sink trying to urinate in sink, with urinal at bedside.  Patient handed urinal and reminded to use the urinal and not the sink.

## 2023-06-21 DIAGNOSIS — R7989 Other specified abnormal findings of blood chemistry: Secondary | ICD-10-CM | POA: Diagnosis not present

## 2023-06-21 DIAGNOSIS — I5043 Acute on chronic combined systolic (congestive) and diastolic (congestive) heart failure: Secondary | ICD-10-CM | POA: Diagnosis not present

## 2023-06-21 DIAGNOSIS — R9431 Abnormal electrocardiogram [ECG] [EKG]: Secondary | ICD-10-CM | POA: Diagnosis not present

## 2023-06-21 DIAGNOSIS — F1410 Cocaine abuse, uncomplicated: Secondary | ICD-10-CM | POA: Diagnosis not present

## 2023-06-21 DIAGNOSIS — I10 Essential (primary) hypertension: Secondary | ICD-10-CM | POA: Diagnosis not present

## 2023-06-21 DIAGNOSIS — D649 Anemia, unspecified: Secondary | ICD-10-CM | POA: Diagnosis not present

## 2023-06-21 LAB — CBC WITH DIFFERENTIAL/PLATELET
Abs Immature Granulocytes: 0.01 10*3/uL (ref 0.00–0.07)
Basophils Absolute: 0 10*3/uL (ref 0.0–0.1)
Basophils Relative: 0 %
Eosinophils Absolute: 0 10*3/uL (ref 0.0–0.5)
Eosinophils Relative: 1 %
HCT: 39.5 % (ref 39.0–52.0)
Hemoglobin: 13.1 g/dL (ref 13.0–17.0)
Immature Granulocytes: 0 %
Lymphocytes Relative: 17 %
Lymphs Abs: 0.8 10*3/uL (ref 0.7–4.0)
MCH: 33.9 pg (ref 26.0–34.0)
MCHC: 33.2 g/dL (ref 30.0–36.0)
MCV: 102.1 fL — ABNORMAL HIGH (ref 80.0–100.0)
Monocytes Absolute: 0.5 10*3/uL (ref 0.1–1.0)
Monocytes Relative: 10 %
Neutro Abs: 3.5 10*3/uL (ref 1.7–7.7)
Neutrophils Relative %: 72 %
Platelets: 134 10*3/uL — ABNORMAL LOW (ref 150–400)
RBC: 3.87 MIL/uL — ABNORMAL LOW (ref 4.22–5.81)
RDW: 18.6 % — ABNORMAL HIGH (ref 11.5–15.5)
WBC: 4.8 10*3/uL (ref 4.0–10.5)
nRBC: 0 % (ref 0.0–0.2)

## 2023-06-21 LAB — TROPONIN I (HIGH SENSITIVITY): Troponin I (High Sensitivity): 63 ng/L — ABNORMAL HIGH (ref <18)

## 2023-06-21 LAB — PROTIME-INR
INR: 1 (ref 0.8–1.2)
Prothrombin Time: 13.6 s (ref 11.4–15.2)

## 2023-06-21 LAB — RAPID URINE DRUG SCREEN, HOSP PERFORMED
Amphetamines: NOT DETECTED
Barbiturates: NOT DETECTED
Benzodiazepines: NOT DETECTED
Cocaine: POSITIVE — AB
Opiates: NOT DETECTED
Tetrahydrocannabinol: NOT DETECTED

## 2023-06-21 LAB — BRAIN NATRIURETIC PEPTIDE: B Natriuretic Peptide: 2904.1 pg/mL — ABNORMAL HIGH (ref 0.0–100.0)

## 2023-06-21 LAB — COMPREHENSIVE METABOLIC PANEL
ALT: 33 U/L (ref 0–44)
AST: 35 U/L (ref 15–41)
Albumin: 3.2 g/dL — ABNORMAL LOW (ref 3.5–5.0)
Alkaline Phosphatase: 67 U/L (ref 38–126)
Anion gap: 11 (ref 5–15)
BUN: 17 mg/dL (ref 6–20)
CO2: 27 mmol/L (ref 22–32)
Calcium: 8.9 mg/dL (ref 8.9–10.3)
Chloride: 101 mmol/L (ref 98–111)
Creatinine, Ser: 1.57 mg/dL — ABNORMAL HIGH (ref 0.61–1.24)
GFR, Estimated: 53 mL/min — ABNORMAL LOW (ref >60)
Glucose, Bld: 132 mg/dL — ABNORMAL HIGH (ref 70–99)
Potassium: 3.8 mmol/L (ref 3.5–5.1)
Sodium: 139 mmol/L (ref 135–145)
Total Bilirubin: 1 mg/dL (ref 0.0–1.2)
Total Protein: 6.9 g/dL (ref 6.5–8.1)

## 2023-06-21 LAB — MAGNESIUM: Magnesium: 1.9 mg/dL (ref 1.7–2.4)

## 2023-06-21 LAB — VITAMIN B12: Vitamin B-12: 364 pg/mL (ref 180–914)

## 2023-06-21 LAB — ETHANOL: Alcohol, Ethyl (B): <10 mg/dL (ref <10)

## 2023-06-21 LAB — FOLATE: Folate: 16.3 ng/mL (ref >5.9)

## 2023-06-21 MED ORDER — GABAPENTIN 100 MG PO CAPS
100.0000 mg | ORAL_CAPSULE | Freq: Two times a day (BID) | ORAL | Status: DC
  Administered 2023-06-21 – 2023-06-24 (×7): 100 mg via ORAL
  Filled 2023-06-21 (×7): qty 1

## 2023-06-21 MED ORDER — METHOCARBAMOL 500 MG PO TABS
500.0000 mg | ORAL_TABLET | Freq: Three times a day (TID) | ORAL | Status: DC | PRN
  Administered 2023-06-21 – 2023-06-23 (×4): 500 mg via ORAL
  Filled 2023-06-21 (×5): qty 1

## 2023-06-21 MED ORDER — SPIRONOLACTONE 25 MG PO TABS
25.0000 mg | ORAL_TABLET | Freq: Every day | ORAL | Status: DC
  Administered 2023-06-21 – 2023-06-22 (×2): 25 mg via ORAL
  Filled 2023-06-21 (×2): qty 1

## 2023-06-21 NOTE — Plan of Care (Signed)
   Problem: Education: Goal: Knowledge of General Education information will improve Description: Including pain rating scale, medication(s)/side effects and non-pharmacologic comfort measures Outcome: Progressing   Problem: Health Behavior/Discharge Planning: Goal: Ability to manage health-related needs will improve Outcome: Progressing   Problem: Safety: Goal: Ability to remain free from injury will improve Outcome: Progressing

## 2023-06-21 NOTE — Progress Notes (Signed)
 PROGRESS NOTE  Charles [name] FMW:[id number] DOB: July 04, 1970   PCP: Benjamine Aland, MD  Patient is from: Homeless?  DOA: 06/20/2023 LOS: 1  Chief complaints Chief Complaint  Patient presents with   Shortness of Breath     Brief Narrative / Interim history: 53 year old M with PMH of combined CHF/BiV failure, chronic cocaine use, HTN and recent hospitalization for 1/26-1/29 returning with SOB, orthopnea, edema for 3 to 4 days in the setting of ongoing cocaine use and noncompliance with home medications, and admitted with acute on chronic CHF.  BNP was elevated to 2500.  Portable CXR with cardiomegaly and increased pulmonary vascular congestion consistent with CHF.  Troponin is slightly elevated without significant delta.  EKG showed sinus rhythm, prolonged QTc to 500 and nonspecific T wave changes which appears to be chronic.  Patient was started on IV Lasix  and admitted.  Subjective: Seen and examined earlier this morning.  No major events overnight of this morning.  Continues to endorse shortness of breath and edema.  He is not a great historian.  He admits to cocaine use but does not think his cocaine is affecting his heart or CHF.  Unfortunately, he believes cocaine is actually helping him.  Objective: Vitals:   06/21/23 0032 06/21/23 0414 06/21/23 0419 06/21/23 1003  BP: (!) 157/112  (!) 139/90 (!) 155/112  Pulse: 93  96 (!) 104  Resp:   18 18  Temp: 98.8 F (37.1 C)  98.7 F (37.1 C)   TempSrc: Oral  Oral Oral  SpO2: 96%  98% 100%  Weight:  72.5 kg      Examination:  GENERAL: No apparent distress.  Nontoxic. HEENT: MMM.  Vision and hearing grossly intact.  NECK: Supple.  No apparent JVD.  RESP:  No IWOB.  Fair aeration bilaterally. CVS:  RRR. Heart sounds normal.  Lying flat in bed. ABD/GI/GU: BS+. Abd soft, NTND.  MSK/EXT:  Moves extremities. No apparent deformity. No edema.  SKIN: no apparent skin lesion or wound NEURO: Sleepy but wakes to voice.  Somewhat restless.   No focal neurodeficit.  No apparent focal neuro deficit. PSYCH: Restless in bed.  Procedures:  None  Microbiology summarized: COVID-19, influenza and RSV PCR nonreactive  Assessment and plan: Acute on chronic combined CHF: Likely due to noncompliance and ongoing cocaine use.  TTE in 01/2023 with LVEF of 25 to 30%, GH, G2 DD, mildly reduced RVSF.  Presents with SOB, orthopnea and edema.  Continues to endorse shortness of breath and edema although I did not appreciate edema on my exam.  He is flat in bed.  He was started on IV Lasix .  Had 1.2 L urine output overnight.  BP elevated partly due to restlessness.  Creatinine stable. -Continue IV Lasix  -Strict intake and output, daily weight, renal functions and electrolytes -Counseled on the importance of compliance with medication and cocaine cessation but patient is reluctant  Chronically elevated troponin: Likely demand ischemia/type II MI in the setting of CHF and cocaine use. -Management as above  Chronic cocaine abuse: Unfortunately this is an ongoing issue.  He does not believe this is affecting his heart.  It is said that he believes cocaine is helping. -Avoid selective beta-blockers.  Essential hypertension: BP elevated but not reliable since she is restless at times.. -Resume home Aldactone , Cozaar  -IV Lasix  as above  Noncompliance -Counseled but this is a persistent issue.  Chronic anemia: Anemia ruled out.  Homelessness? -Consult TOC   Body mass index is 25.8 kg/m.  DVT prophylaxis:  SCDs Start: 06/20/23 1946 No pork products due to religious issue.  Code Status: Full code Family Communication: None at bedside. Level of care: Progressive Status is: Inpatient Remains inpatient appropriate because: Acute on chronic CHF   Final disposition: Home Consultants:  None  55 minutes with more than 50% spent in reviewing records, counseling patient/family and coordinating care.   Sch Meds:  Scheduled  Meds:  gabapentin   100 mg Oral BID   losartan   25 mg Oral Daily   spironolactone   25 mg Oral Daily   Continuous Infusions:  furosemide  100 mg (06/21/23 1034)   PRN Meds:.acetaminophen  **OR** acetaminophen , labetalol , LORazepam , melatonin  Antimicrobials: Anti-infectives (From admission, onward)    None        I have personally reviewed the following labs and images: CBC: Recent Labs  Lab 06/20/23 1611 06/21/23 0228  WBC 5.0 4.8  NEUTROABS 3.9 3.5  HGB 11.9* 13.1  HCT 37.3* 39.5  MCV 105.1* 102.1*  PLT 122* 134*   BMP &GFR Recent Labs  Lab 06/20/23 1611 06/21/23 0228  NA 139 139  K 4.3 3.8  CL 105 101  CO2 25 27  GLUCOSE 93 132*  BUN 13 17  CREATININE 1.43* 1.57*  CALCIUM  8.8* 8.9  MG  --  1.9   Estimated Creatinine Clearance: 49.7 mL/min (A) (by C-G formula based on SCr of 1.57 mg/dL (H)). Liver & Pancreas: Recent Labs  Lab 06/20/23 1611 06/21/23 0228  AST 37 35  ALT 32 33  ALKPHOS 61 67  BILITOT 1.0 1.0  PROT 6.6 6.9  ALBUMIN 3.1* 3.2*   No results for input(s): LIPASE, AMYLASE in the last 168 hours. No results for input(s): AMMONIA in the last 168 hours. Diabetic: No results for input(s): HGBA1C in the last 72 hours. No results for input(s): GLUCAP in the last 168 hours. Cardiac Enzymes: No results for input(s): CKTOTAL, CKMB, CKMBINDEX, TROPONINI in the last 168 hours. No results for input(s): PROBNP in the last 8760 hours. Coagulation Profile: Recent Labs  Lab 06/21/23 0228  INR 1.0   Thyroid  Function Tests: No results for input(s): TSH, T4TOTAL, FREET4, T3FREE, THYROIDAB in the last 72 hours. Lipid Profile: No results for input(s): CHOL, HDL, LDLCALC, TRIG, CHOLHDL, LDLDIRECT in the last 72 hours. Anemia Panel: Recent Labs    06/21/23 0228  VITAMINB12 364  FOLATE 16.3   Urine analysis:    Component Value Date/Time   COLORURINE YELLOW 06/08/2023 2138   APPEARANCEUR CLEAR 06/08/2023  2138   LABSPEC 1.013 06/08/2023 2138   PHURINE 5.0 06/08/2023 2138   GLUCOSEU NEGATIVE 06/08/2023 2138   HGBUR LARGE (A) 06/08/2023 2138   BILIRUBINUR NEGATIVE 06/08/2023 2138   KETONESUR NEGATIVE 06/08/2023 2138   PROTEINUR 30 (A) 06/08/2023 2138   UROBILINOGEN 1.0 03/10/2015 1203   NITRITE NEGATIVE 06/08/2023 2138   LEUKOCYTESUR NEGATIVE 06/08/2023 2138   Sepsis Labs: Invalid input(s): PROCALCITONIN, LACTICIDVEN  Microbiology: Recent Results (from the past 240 hours)  Resp panel by RT-PCR (RSV, Flu A&B, Covid) Anterior Nasal Swab     Status: None   Collection Time: 06/20/23  4:13 PM   Specimen: Anterior Nasal Swab  Result Value Ref Range Status   SARS Coronavirus 2 by RT PCR NEGATIVE NEGATIVE Final   Influenza A by PCR NEGATIVE NEGATIVE Final   Influenza B by PCR NEGATIVE NEGATIVE Final    Comment: (NOTE) The Xpert Xpress SARS-CoV-2/FLU/RSV plus assay is intended as an aid in the diagnosis of influenza from Nasopharyngeal swab specimens  and should not be used as a sole basis for treatment. Nasal washings and aspirates are unacceptable for Xpert Xpress SARS-CoV-2/FLU/RSV testing.  Fact Sheet for Patients: bloggercourse.com  Fact Sheet for Healthcare Providers: seriousbroker.it  This test is not yet approved or cleared by the [place]  FDA and has been authorized for detection and/or diagnosis of SARS-CoV-2 by FDA under an Emergency Use Authorization (EUA). This EUA will remain in effect (meaning this test can be used) for the duration of the COVID-19 declaration under Section 564(b)(1) of the Act, 21 U.S.C. section 360bbb-3(b)(1), unless the authorization is terminated or revoked.     Resp Syncytial Virus by PCR NEGATIVE NEGATIVE Final    Comment: (NOTE) Fact Sheet for Patients: bloggercourse.com  Fact Sheet for Healthcare Providers: seriousbroker.it  This  test is not yet approved or cleared by the [place]  FDA and has been authorized for detection and/or diagnosis of SARS-CoV-2 by FDA under an Emergency Use Authorization (EUA). This EUA will remain in effect (meaning this test can be used) for the duration of the COVID-19 declaration under Section 564(b)(1) of the Act, 21 U.S.C. section 360bbb-3(b)(1), unless the authorization is terminated or revoked.  Performed at [facility], [address] [place], [place] [address]     Radiology Studies: DG Chest Portable 1 View Result Date: 06/20/2023 CLINICAL DATA:  Dyspnea EXAM: PORTABLE CHEST 1 VIEW COMPARISON:  Chest x-ray 06/08/2023 FINDINGS: Heart is enlarged. There central pulmonary vascular congestion. There is no focal lung infiltrate, pleural effusion or pneumothorax. No acute fractures are seen. IMPRESSION: Cardiomegaly with central pulmonary vascular congestion. Electronically Signed   By: Greig Pique M.D.   On: 06/20/2023 17:46      Abdiaziz Klahn T. Dhara Schepp [facility]  If 7PM-7AM, please contact night-coverage [email or web address] 06/21/2023, 10:58 AM

## 2023-06-21 NOTE — Progress Notes (Signed)
 Pt refusing bed alarm Pt verbally states that he understands it is for safety but states he does not need it. Pt knows how to turn the bed alarm off. Pt is steady on his feet and able to ambulate independently at this time.   Charge RN & physician notified

## 2023-06-22 ENCOUNTER — Inpatient Hospital Stay (HOSPITAL_COMMUNITY): Payer: Medicare HMO

## 2023-06-22 DIAGNOSIS — I5043 Acute on chronic combined systolic (congestive) and diastolic (congestive) heart failure: Secondary | ICD-10-CM | POA: Diagnosis not present

## 2023-06-22 DIAGNOSIS — M1612 Unilateral primary osteoarthritis, left hip: Secondary | ICD-10-CM | POA: Diagnosis not present

## 2023-06-22 DIAGNOSIS — F1410 Cocaine abuse, uncomplicated: Secondary | ICD-10-CM | POA: Diagnosis not present

## 2023-06-22 DIAGNOSIS — D649 Anemia, unspecified: Secondary | ICD-10-CM | POA: Diagnosis not present

## 2023-06-22 DIAGNOSIS — M47816 Spondylosis without myelopathy or radiculopathy, lumbar region: Secondary | ICD-10-CM | POA: Diagnosis not present

## 2023-06-22 DIAGNOSIS — M545 Low back pain, unspecified: Secondary | ICD-10-CM | POA: Diagnosis not present

## 2023-06-22 DIAGNOSIS — R7989 Other specified abnormal findings of blood chemistry: Secondary | ICD-10-CM | POA: Diagnosis not present

## 2023-06-22 DIAGNOSIS — I10 Essential (primary) hypertension: Secondary | ICD-10-CM | POA: Diagnosis not present

## 2023-06-22 DIAGNOSIS — R9431 Abnormal electrocardiogram [ECG] [EKG]: Secondary | ICD-10-CM | POA: Diagnosis not present

## 2023-06-22 LAB — CBC
HCT: 40.3 % (ref 39.0–52.0)
Hemoglobin: 13.6 g/dL (ref 13.0–17.0)
MCH: 34.3 pg — ABNORMAL HIGH (ref 26.0–34.0)
MCHC: 33.7 g/dL (ref 30.0–36.0)
MCV: 101.5 fL — ABNORMAL HIGH (ref 80.0–100.0)
Platelets: 109 10*3/uL — ABNORMAL LOW (ref 150–400)
RBC: 3.97 MIL/uL — ABNORMAL LOW (ref 4.22–5.81)
RDW: 18 % — ABNORMAL HIGH (ref 11.5–15.5)
WBC: 2.8 10*3/uL — ABNORMAL LOW (ref 4.0–10.5)
nRBC: 0 % (ref 0.0–0.2)

## 2023-06-22 LAB — RENAL FUNCTION PANEL
Albumin: 2.9 g/dL — ABNORMAL LOW (ref 3.5–5.0)
Anion gap: 10 (ref 5–15)
BUN: 26 mg/dL — ABNORMAL HIGH (ref 6–20)
CO2: 28 mmol/L (ref 22–32)
Calcium: 9 mg/dL (ref 8.9–10.3)
Chloride: 98 mmol/L (ref 98–111)
Creatinine, Ser: 1.45 mg/dL — ABNORMAL HIGH (ref 0.61–1.24)
GFR, Estimated: 58 mL/min — ABNORMAL LOW (ref >60)
Glucose, Bld: 114 mg/dL — ABNORMAL HIGH (ref 70–99)
Phosphorus: 3.5 mg/dL (ref 2.5–4.6)
Potassium: 4.1 mmol/L (ref 3.5–5.1)
Sodium: 136 mmol/L (ref 135–145)

## 2023-06-22 LAB — MAGNESIUM: Magnesium: 2 mg/dL (ref 1.7–2.4)

## 2023-06-22 MED ORDER — TORSEMIDE 20 MG PO TABS
60.0000 mg | ORAL_TABLET | Freq: Every day | ORAL | Status: DC
  Administered 2023-06-22: 60 mg via ORAL
  Filled 2023-06-22: qty 3

## 2023-06-22 MED ORDER — DICLOFENAC SODIUM 1 % EX GEL
2.0000 g | Freq: Four times a day (QID) | CUTANEOUS | Status: DC
  Administered 2023-06-22 – 2023-06-23 (×4): 2 g via TOPICAL
  Filled 2023-06-22: qty 100

## 2023-06-22 NOTE — Plan of Care (Signed)
  Problem: Nutrition: Goal: Adequate nutrition will be maintained Outcome: Completed/Met   Problem: Elimination: Goal: Will not experience complications related to urinary retention Outcome: Completed/Met   Problem: Pain Managment: Goal: General experience of comfort will improve and/or be controlled Outcome: Completed/Met   Problem: Skin Integrity: Goal: Risk for impaired skin integrity will decrease Outcome: Completed/Met

## 2023-06-22 NOTE — Progress Notes (Addendum)
 PROGRESS NOTE  Charles [name] FMW:[id number] DOB: May 21, 1970   PCP: Benjamine Aland, MD  Patient is from: Homeless?  DOA: 06/20/2023 LOS: 2  Chief complaints Chief Complaint  Patient presents with   Shortness of Breath     Brief Narrative / Interim history: 53 year old M with PMH of combined CHF/BiV failure, chronic cocaine use, HTN and recent hospitalization for 1/26-1/29 returning with SOB, orthopnea, edema for 3 to 4 days in the setting of ongoing cocaine use and noncompliance with home medications, and admitted with acute on chronic CHF.  BNP was elevated to 2500.  Portable CXR with cardiomegaly and increased pulmonary vascular congestion consistent with CHF.  Troponin is slightly elevated without significant delta.  EKG showed sinus rhythm, prolonged QTc to 500 and nonspecific T wave changes which appears to be chronic.  Patient was started on IV Lasix  and admitted.  Subjective: Seen and examined earlier this morning.  No major events overnight of this morning.  Complains of low back pain radiating to his upper legs bilaterally.  Ongoing for about 2 weeks.  He denies trauma or fall.  Also reports shortness of breath although he does not appear to be in that mild distress.   Objective: Vitals:   06/22/23 0146 06/22/23 0335 06/22/23 0533 06/22/23 0734  BP: (!) 135/93  107/69 126/89  Pulse: 95  88 85  Resp: 18  20 20   Temp: 97.8 F (36.6 C)  98.7 F (37.1 C) 98.5 F (36.9 C)  TempSrc: Oral  Oral Oral  SpO2: 100%  100% 100%  Weight:  67.9 kg      Examination:  GENERAL: No apparent distress.  Nontoxic. HEENT: MMM.  Vision and hearing grossly intact.  NECK: Supple.  No apparent JVD.  RESP:  No IWOB.  Fair aeration bilaterally. CVS:  RRR. Heart sounds normal.  Lying flat in bed. ABD/GI/GU: BS+. Abd soft, NTND.  MSK/EXT:  Moves extremities.  Symmetric strength. SKIN: no apparent skin lesion or wound NEURO: Sleepy but wakes to voice.  Somewhat restless.  No focal  neurodeficit.  No apparent focal neuro deficit. PSYCH: Restless in bed.  Procedures:  None  Microbiology summarized: COVID-19, influenza and RSV PCR nonreactive  Assessment and plan: Acute on chronic combined CHF: Likely due to noncompliance and ongoing cocaine use.  TTE in 01/2023 with LVEF of 25 to 30%, GH, G2 DD, mildly reduced RVSF.  Presents with SOB, orthopnea and edema.  Continues to endorse SOB although he does not appear to be in that mild distress.  Definitely no orthopnea or edema.  He is lying flat in bed.  Had about 3.1 L UOP/24 hours.  Net -2 L. Cr improved.  BP stable. -Transition to p.o. torsemide  60 mg daily -Strict intake and output, daily weight, renal functions and electrolytes -Counseled on the importance of compliance with medication and cocaine cessation but patient is reluctant -Assess ambulatory saturation. -Will have pharmacy review discharge medications with patient before discharge.   Chronically elevated troponin: Likely demand ischemia/type II MI in the setting of CHF and cocaine use. -Management as above  Chronic cocaine abuse: Unfortunately this is an ongoing issue.  He does not believe this is affecting his heart.  It is said that he believes cocaine is helping. -Avoid selective beta-blockers.  Essential hypertension: BP elevated but not reliable since she is restless at times.. -Resume home Aldactone , Cozaar  -IV Lasix  as above  Noncompliance -Counseled but this is a persistent issue.  Chronic anemia: Anemia ruled out.  Low  back pain: Ongoing for about 2 weeks.  No trauma or fall.  Exam reassuring. -Will get lumbar and sacral films -Continue Robaxin  -Add Voltaren  gel -PT/OT  Homelessness? -Consult TOC  Leukopenia/thrombocytopenia: Due to substance abuse? -Recheck in the morning.   Body mass index is 24.16 kg/m.           DVT prophylaxis:  SCDs Start: 06/20/23 1946 No pork products due to religious issue.  Code Status: Full  code Family Communication: None at bedside. Level of care: Telemetry Cardiac Status is: Inpatient Remains inpatient appropriate because: Acute on chronic CHF   Final disposition: Home on 2/10.  Will send new Rx for diuretics to [facility] pharmacy Consultants:  None  55 minutes with more than 50% spent in reviewing records, counseling patient/family and coordinating care.   Sch Meds:  Scheduled Meds:  diclofenac  Sodium  2 g Topical QID   gabapentin   100 mg Oral BID   losartan   25 mg Oral Daily   spironolactone   25 mg Oral Daily   torsemide   60 mg Oral Daily   Continuous Infusions:   PRN Meds:.acetaminophen  **OR** acetaminophen , labetalol , LORazepam , melatonin, methocarbamol   Antimicrobials: Anti-infectives (From admission, onward)    None        I have personally reviewed the following labs and images: CBC: Recent Labs  Lab 06/20/23 1611 06/21/23 0228 06/22/23 0302  WBC 5.0 4.8 2.8*  NEUTROABS 3.9 3.5  --   HGB 11.9* 13.1 13.6  HCT 37.3* 39.5 40.3  MCV 105.1* 102.1* 101.5*  PLT 122* 134* 109*   BMP &GFR Recent Labs  Lab 06/20/23 1611 06/21/23 0228 06/22/23 0302  NA 139 139 136  K 4.3 3.8 4.1  CL 105 101 98  CO2 25 27 28   GLUCOSE 93 132* 114*  BUN 13 17 26*  CREATININE 1.43* 1.57* 1.45*  CALCIUM  8.8* 8.9 9.0  MG  --  1.9 2.0  PHOS  --   --  3.5   Estimated Creatinine Clearance: 53.8 mL/min (A) (by C-G formula based on SCr of 1.45 mg/dL (H)). Liver & Pancreas: Recent Labs  Lab 06/20/23 1611 06/21/23 0228 06/22/23 0302  AST 37 35  --   ALT 32 33  --   ALKPHOS 61 67  --   BILITOT 1.0 1.0  --   PROT 6.6 6.9  --   ALBUMIN 3.1* 3.2* 2.9*   No results for input(s): LIPASE, AMYLASE in the last 168 hours. No results for input(s): AMMONIA in the last 168 hours. Diabetic: No results for input(s): HGBA1C in the last 72 hours. No results for input(s): GLUCAP in the last 168 hours. Cardiac Enzymes: No results for input(s): CKTOTAL, CKMB,  CKMBINDEX, TROPONINI in the last 168 hours. No results for input(s): PROBNP in the last 8760 hours. Coagulation Profile: Recent Labs  Lab 06/21/23 0228  INR 1.0   Thyroid  Function Tests: No results for input(s): TSH, T4TOTAL, FREET4, T3FREE, THYROIDAB in the last 72 hours. Lipid Profile: No results for input(s): CHOL, HDL, LDLCALC, TRIG, CHOLHDL, LDLDIRECT in the last 72 hours. Anemia Panel: Recent Labs    06/21/23 0228  VITAMINB12 364  FOLATE 16.3   Urine analysis:    Component Value Date/Time   COLORURINE YELLOW 06/08/2023 2138   APPEARANCEUR CLEAR 06/08/2023 2138   LABSPEC 1.013 06/08/2023 2138   PHURINE 5.0 06/08/2023 2138   GLUCOSEU NEGATIVE 06/08/2023 2138   HGBUR LARGE (A) 06/08/2023 2138   BILIRUBINUR NEGATIVE 06/08/2023 2138   KETONESUR NEGATIVE 06/08/2023 2138  PROTEINUR 30 (A) 06/08/2023 2138   UROBILINOGEN 1.0 03/10/2015 1203   NITRITE NEGATIVE 06/08/2023 2138   LEUKOCYTESUR NEGATIVE 06/08/2023 2138   Sepsis Labs: Invalid input(s): PROCALCITONIN, LACTICIDVEN  Microbiology: Recent Results (from the past 240 hours)  Resp panel by RT-PCR (RSV, Flu A&B, Covid) Anterior Nasal Swab     Status: None   Collection Time: 06/20/23  4:13 PM   Specimen: Anterior Nasal Swab  Result Value Ref Range Status   SARS Coronavirus 2 by RT PCR NEGATIVE NEGATIVE Final   Influenza A by PCR NEGATIVE NEGATIVE Final   Influenza B by PCR NEGATIVE NEGATIVE Final    Comment: (NOTE) The Xpert Xpress SARS-CoV-2/FLU/RSV plus assay is intended as an aid in the diagnosis of influenza from Nasopharyngeal swab specimens and should not be used as a sole basis for treatment. Nasal washings and aspirates are unacceptable for Xpert Xpress SARS-CoV-2/FLU/RSV testing.  Fact Sheet for Patients: bloggercourse.com  Fact Sheet for Healthcare Providers: seriousbroker.it  This test is not yet approved or cleared  by the [place]  FDA and has been authorized for detection and/or diagnosis of SARS-CoV-2 by FDA under an Emergency Use Authorization (EUA). This EUA will remain in effect (meaning this test can be used) for the duration of the COVID-19 declaration under Section 564(b)(1) of the Act, 21 U.S.C. section 360bbb-3(b)(1), unless the authorization is terminated or revoked.     Resp Syncytial Virus by PCR NEGATIVE NEGATIVE Final    Comment: (NOTE) Fact Sheet for Patients: bloggercourse.com  Fact Sheet for Healthcare Providers: seriousbroker.it  This test is not yet approved or cleared by the [place]  FDA and has been authorized for detection and/or diagnosis of SARS-CoV-2 by FDA under an Emergency Use Authorization (EUA). This EUA will remain in effect (meaning this test can be used) for the duration of the COVID-19 declaration under Section 564(b)(1) of the Act, 21 U.S.C. section 360bbb-3(b)(1), unless the authorization is terminated or revoked.  Performed at [facility], [address], [place], [place] [address]     Radiology Studies: No results found.     Letcher Schweikert T. Tanaysha Alkins [facility]  If 7PM-7AM, please contact night-coverage [email or web address] 06/22/2023, 10:48 AM

## 2023-06-23 DIAGNOSIS — I10 Essential (primary) hypertension: Secondary | ICD-10-CM | POA: Diagnosis not present

## 2023-06-23 DIAGNOSIS — F1410 Cocaine abuse, uncomplicated: Secondary | ICD-10-CM | POA: Diagnosis not present

## 2023-06-23 DIAGNOSIS — D649 Anemia, unspecified: Secondary | ICD-10-CM | POA: Diagnosis not present

## 2023-06-23 DIAGNOSIS — R7989 Other specified abnormal findings of blood chemistry: Secondary | ICD-10-CM | POA: Diagnosis not present

## 2023-06-23 DIAGNOSIS — I5043 Acute on chronic combined systolic (congestive) and diastolic (congestive) heart failure: Secondary | ICD-10-CM | POA: Diagnosis not present

## 2023-06-23 DIAGNOSIS — R9431 Abnormal electrocardiogram [ECG] [EKG]: Secondary | ICD-10-CM | POA: Diagnosis not present

## 2023-06-23 LAB — RENAL FUNCTION PANEL
Albumin: 3 g/dL — ABNORMAL LOW (ref 3.5–5.0)
Anion gap: 13 (ref 5–15)
BUN: 35 mg/dL — ABNORMAL HIGH (ref 6–20)
CO2: 27 mmol/L (ref 22–32)
Calcium: 9 mg/dL (ref 8.9–10.3)
Chloride: 96 mmol/L — ABNORMAL LOW (ref 98–111)
Creatinine, Ser: 1.86 mg/dL — ABNORMAL HIGH (ref 0.61–1.24)
GFR, Estimated: 43 mL/min — ABNORMAL LOW (ref >60)
Glucose, Bld: 103 mg/dL — ABNORMAL HIGH (ref 70–99)
Phosphorus: 4.1 mg/dL (ref 2.5–4.6)
Potassium: 4 mmol/L (ref 3.5–5.1)
Sodium: 136 mmol/L (ref 135–145)

## 2023-06-23 LAB — CBC WITH DIFFERENTIAL/PLATELET
Abs Immature Granulocytes: 0.01 10*3/uL (ref 0.00–0.07)
Basophils Absolute: 0 10*3/uL (ref 0.0–0.1)
Basophils Relative: 0 %
Eosinophils Absolute: 0.1 10*3/uL (ref 0.0–0.5)
Eosinophils Relative: 2 %
HCT: 41.9 % (ref 39.0–52.0)
Hemoglobin: 14.4 g/dL (ref 13.0–17.0)
Immature Granulocytes: 0 %
Lymphocytes Relative: 36 %
Lymphs Abs: 1.2 10*3/uL (ref 0.7–4.0)
MCH: 34.6 pg — ABNORMAL HIGH (ref 26.0–34.0)
MCHC: 34.4 g/dL (ref 30.0–36.0)
MCV: 100.7 fL — ABNORMAL HIGH (ref 80.0–100.0)
Monocytes Absolute: 0.4 10*3/uL (ref 0.1–1.0)
Monocytes Relative: 12 %
Neutro Abs: 1.6 10*3/uL — ABNORMAL LOW (ref 1.7–7.7)
Neutrophils Relative %: 50 %
Platelets: 127 10*3/uL — ABNORMAL LOW (ref 150–400)
RBC: 4.16 MIL/uL — ABNORMAL LOW (ref 4.22–5.81)
RDW: 17.5 % — ABNORMAL HIGH (ref 11.5–15.5)
WBC: 3.3 10*3/uL — ABNORMAL LOW (ref 4.0–10.5)
nRBC: 0 % (ref 0.0–0.2)

## 2023-06-23 LAB — MAGNESIUM: Magnesium: 2 mg/dL (ref 1.7–2.4)

## 2023-06-23 LAB — BRAIN NATRIURETIC PEPTIDE: B Natriuretic Peptide: 142.6 pg/mL — ABNORMAL HIGH (ref 0.0–100.0)

## 2023-06-23 MED ORDER — CARVEDILOL 6.25 MG PO TABS
6.2500 mg | ORAL_TABLET | Freq: Two times a day (BID) | ORAL | Status: DC
  Administered 2023-06-23 – 2023-06-24 (×3): 6.25 mg via ORAL
  Filled 2023-06-23 (×3): qty 1

## 2023-06-23 NOTE — Progress Notes (Signed)
 PT Cancellation Note  Patient Details Name: Charles [name] MRN: [id number] DOB: 31-Jul-1970   Cancelled Treatment:    Reason Eval/Treat Not Completed: Patient declined, no reason specified (Pt refused PT evaluation at this time reporting the RN had just applied ointment to his back to help with pain and he needed more time for it to start working as well as recently working with OT and needing more time to rest before mobilizing again.) Will re-attempt later today as schedule permits.   Glenford Lanes, PT, DPT Acute Rehabilitation Services Office: [phone] Secure Chat Preferred  Riva Chester 06/23/2023, 9:51 AM

## 2023-06-23 NOTE — Progress Notes (Signed)
 PT Cancellation Note  Patient Details Name: Charles [name] MRN: [id number] DOB: Mar 18, 1971   Cancelled Treatment:    Reason Eval/Treat Not Completed: Patient declined, no reason specified;PT screened, no needs identified, will sign off (Pt refused PT evaluation at this time reporting he had just eaten and his stomach was upset, so he couldn't participate in mobility. Educated him on the benefits engagement in acute PT could provide. Per OT, pt presents at baseline for ADLs/mobility.) Recommend pt OOB<>chair 3x/day and mobilize frequently while admitted.   Glenford Lanes, PT, DPT Acute Rehabilitation Services Office: [phone] Secure Chat Preferred   Riva Chester 06/23/2023, 1:51 PM

## 2023-06-23 NOTE — Progress Notes (Signed)
 PROGRESS NOTE  Charles [name] ZOX:[id number] DOB: 1970-09-25   PCP: Jonathon Neighbors, MD  Patient is from: Homeless?  DOA: 06/20/2023 LOS: 3  Chief complaints Chief Complaint  Patient presents with   Shortness of Breath     Brief Narrative / Interim history: 53 year old M with PMH of combined CHF/BiV failure, chronic cocaine use, HTN and recent hospitalization for 1/26-1/29 returning with SOB, orthopnea, edema for 3 to 4 days in the setting of ongoing cocaine use and noncompliance with home medications, and admitted with acute on chronic CHF.  BNP was elevated to 2500.  Portable CXR with cardiomegaly and increased pulmonary vascular congestion consistent with CHF.  Troponin is slightly elevated without significant delta.  EKG showed sinus rhythm, prolonged QTc to 500 and nonspecific T wave changes which appears to be chronic.  Patient was started on IV Lasix  and admitted.  Patient diuresed well with IV Lasix  but developed AKI.  Diuretics on hold.  Subjective: Seen and examined earlier this morning.  No major events overnight of this morning.  Continues to complain shortness of breath, edema and back pain although he does not seem to be in any kind of distress.  There is no edema on exam.  He had about 5 L UOP/24 hours.  Net -6 L.  However, he has mild AKI.  Objective: Vitals:   06/22/23 1949 06/23/23 0054 06/23/23 0551 06/23/23 0734  BP: 117/76 133/81 (!) 151/124 (!) 125/94  Pulse:  83 88 79  Resp: 20 20 20 18   Temp:  98.5 F (36.9 C) 98.7 F (37.1 C) 97.9 F (36.6 C)  TempSrc: Oral Oral Oral Oral  SpO2: 99% 99% 94% 99%  Weight:   71 kg     Examination:  GENERAL: No apparent distress.  Nontoxic. HEENT: MMM.  Vision and hearing grossly intact.  NECK: Supple.  No apparent JVD.  RESP:  No IWOB.  Fair aeration bilaterally. CVS:  RRR. Heart sounds normal.  Lying flat in bed. ABD/GI/GU: BS+. Abd soft, NTND.  MSK/EXT:  Moves extremities.  Symmetric strength. SKIN: no apparent skin  lesion or wound NEURO: Awake and alert.  No focal neurodeficit.  No apparent focal neuro deficit. PSYCH: Calm.  No distress or agitation.  Procedures:  None  Microbiology summarized: COVID-19, influenza and RSV PCR nonreactive  Assessment and plan: Acute on chronic combined CHF: Likely due to noncompliance and ongoing cocaine use.  TTE in 01/2023 with LVEF of 25 to 30%, GH, G2 DD, mildly reduced RVSF.  Presents with SOB, orthopnea and edema.  Continues to endorse shortness of breath and edema although there is no objective finding to go along.  Appears euvolemic on exam.  Excellent urine output.  About 5 L in the last 24 hours.  Net -6 L.  However, he developed mild AKI. -Hold diuretics-torsemide , Aldactone  and losartan . -Start low-dose Coreg  for blood pressure control -Strict intake and output, daily weight, renal functions and electrolytes -Counseled on the importance of compliance with medication and cocaine cessation but patient is reluctant -Assess ambulatory saturation. -Will have pharmacy review discharge medications with patient before discharge.   Chronically elevated troponin: Likely demand ischemia/type II MI in the setting of CHF and cocaine use. -Management as above  Chronic cocaine abuse: Unfortunately this is an ongoing issue.  He does not believe this is affecting his heart.  It is said that he believes cocaine is helping. -Avoid selective beta-blockers.  AKI on CKD-3A: Baseline Cr 1.4-1.5.  AKI could be due to diuretics  and ARB. Recent Labs    06/02/23 0255 06/03/23 0256 06/04/23 0238 06/08/23 1510 06/08/23 2149 06/09/23 0300 06/10/23 0619 06/20/23 1611 06/21/23 0228 06/22/23 0302 06/23/23 0311  BUN 38* 45* 37* 74*  --  61* 37* 13 17 26* 35*  CREATININE 1.96* 2.08* 1.45* 3.91* 3.39* 2.75* 1.57* 1.43* 1.57* 1.45* 1.86*  -Hold diuretics today -Recheck in the morning   Essential hypertension: BP tends to fluctuate. -Hold Demadex , Aldactone  and Cozaar  due to  AKI -Start Coreg  6.25 mg twice daily for blood pressure control  Noncompliance -Counseled but this is a persistent issue.  Chronic anemia: Anemia ruled out.  Low back pain: Ongoing for about 2 weeks.  No trauma or fall.  Exam reassuring.  Lumbar and sacral films without acute finding. -Continue Robaxin  -Add Voltaren  gel -PT/OT  Homelessness? -Consult TOC  Leukopenia/thrombocytopenia: Due to substance abuse?  Improved. -Recheck in the morning.   Body mass index is 25.26 kg/m.           DVT prophylaxis:  SCDs Start: 06/20/23 1946 No pork products due to religious issue.  Code Status: Full code Family Communication: None at bedside. Level of care: Telemetry Cardiac Status is: Inpatient Remains inpatient appropriate because: Acute on chronic CHF and AKI   Final disposition: Home on on 2/11. Consultants:  None  55 minutes with more than 50% spent in reviewing records, counseling patient/family and coordinating care.   Sch Meds:  Scheduled Meds:  carvedilol   6.25 mg Oral BID WC   diclofenac  Sodium  2 g Topical QID   gabapentin   100 mg Oral BID   Continuous Infusions:   PRN Meds:.acetaminophen  **OR** acetaminophen , labetalol , LORazepam , melatonin, methocarbamol   Antimicrobials: Anti-infectives (From admission, onward)    None        I have personally reviewed the following labs and images: CBC: Recent Labs  Lab 06/20/23 1611 06/21/23 0228 06/22/23 0302 06/23/23 0311  WBC 5.0 4.8 2.8* 3.3*  NEUTROABS 3.9 3.5  --  1.6*  HGB 11.9* 13.1 13.6 14.4  HCT 37.3* 39.5 40.3 41.9  MCV 105.1* 102.1* 101.5* 100.7*  PLT 122* 134* 109* 127*   BMP &GFR Recent Labs  Lab 06/20/23 1611 06/21/23 0228 06/22/23 0302 06/23/23 0311  NA 139 139 136 136  K 4.3 3.8 4.1 4.0  CL 105 101 98 96*  CO2 25 27 28 27   GLUCOSE 93 132* 114* 103*  BUN 13 17 26* 35*  CREATININE 1.43* 1.57* 1.45* 1.86*  CALCIUM  8.8* 8.9 9.0 9.0  MG  --  1.9 2.0 2.0  PHOS  --   --  3.5  4.1   Estimated Creatinine Clearance: 41.9 mL/min (A) (by C-G formula based on SCr of 1.86 mg/dL (H)). Liver & Pancreas: Recent Labs  Lab 06/20/23 1611 06/21/23 0228 06/22/23 0302 06/23/23 0311  AST 37 35  --   --   ALT 32 33  --   --   ALKPHOS 61 67  --   --   BILITOT 1.0 1.0  --   --   PROT 6.6 6.9  --   --   ALBUMIN 3.1* 3.2* 2.9* 3.0*   No results for input(s): "LIPASE", "AMYLASE" in the last 168 hours. No results for input(s): "AMMONIA" in the last 168 hours. Diabetic: No results for input(s): "HGBA1C" in the last 72 hours. No results for input(s): "GLUCAP" in the last 168 hours. Cardiac Enzymes: No results for input(s): "CKTOTAL", "CKMB", "CKMBINDEX", "TROPONINI" in the last 168 hours. No results for input(s): "PROBNP"  in the last 8760 hours. Coagulation Profile: Recent Labs  Lab 06/21/23 0228  INR 1.0   Thyroid  Function Tests: No results for input(s): "TSH", "T4TOTAL", "FREET4", "T3FREE", "THYROIDAB" in the last 72 hours. Lipid Profile: No results for input(s): "CHOL", "HDL", "LDLCALC", "TRIG", "CHOLHDL", "LDLDIRECT" in the last 72 hours. Anemia Panel: Recent Labs    06/21/23 0228  VITAMINB12 364  FOLATE 16.3   Urine analysis:    Component Value Date/Time   COLORURINE YELLOW 06/08/2023 2138   APPEARANCEUR CLEAR 06/08/2023 2138   LABSPEC 1.013 06/08/2023 2138   PHURINE 5.0 06/08/2023 2138   GLUCOSEU NEGATIVE 06/08/2023 2138   HGBUR LARGE (A) 06/08/2023 2138   BILIRUBINUR NEGATIVE 06/08/2023 2138   KETONESUR NEGATIVE 06/08/2023 2138   PROTEINUR 30 (A) 06/08/2023 2138   UROBILINOGEN 1.0 03/10/2015 1203   NITRITE NEGATIVE 06/08/2023 2138   LEUKOCYTESUR NEGATIVE 06/08/2023 2138   Sepsis Labs: Invalid input(s): "PROCALCITONIN", "LACTICIDVEN"  Microbiology: Recent Results (from the past 240 hours)  Resp panel by RT-PCR (RSV, Flu A&B, Covid) Anterior Nasal Swab     Status: None   Collection Time: 06/20/23  4:13 PM   Specimen: Anterior Nasal Swab   Result Value Ref Range Status   SARS Coronavirus 2 by RT PCR NEGATIVE NEGATIVE Final   Influenza A by PCR NEGATIVE NEGATIVE Final   Influenza B by PCR NEGATIVE NEGATIVE Final    Comment: (NOTE) The Xpert Xpress SARS-CoV-2/FLU/RSV plus assay is intended as an aid in the diagnosis of influenza from Nasopharyngeal swab specimens and should not be used as a sole basis for treatment. Nasal washings and aspirates are unacceptable for Xpert Xpress SARS-CoV-2/FLU/RSV testing.  Fact Sheet for Patients: BloggerCourse.com  Fact Sheet for Healthcare Providers: SeriousBroker.it  This test is not yet approved or cleared by the [place]  FDA and has been authorized for detection and/or diagnosis of SARS-CoV-2 by FDA under an Emergency Use Authorization (EUA). This EUA will remain in effect (meaning this test can be used) for the duration of the COVID-19 declaration under Section 564(b)(1) of the Act, 21 U.S.C. section 360bbb-3(b)(1), unless the authorization is terminated or revoked.     Resp Syncytial Virus by PCR NEGATIVE NEGATIVE Final    Comment: (NOTE) Fact Sheet for Patients: BloggerCourse.com  Fact Sheet for Healthcare Providers: SeriousBroker.it  This test is not yet approved or cleared by the [place]  FDA and has been authorized for detection and/or diagnosis of SARS-CoV-2 by FDA under an Emergency Use Authorization (EUA). This EUA will remain in effect (meaning this test can be used) for the duration of the COVID-19 declaration under Section 564(b)(1) of the Act, 21 U.S.C. section 360bbb-3(b)(1), unless the authorization is terminated or revoked.  Performed at [facility], [address] [place], [place] [address]     Radiology Studies: DG Lumbar Spine 2-3 Views Result Date: 06/22/2023 CLINICAL DATA:  Back pain EXAM: LUMBAR SPINE - 2-3 VIEW; SACRUM AND COCCYX  - 2+ VIEW COMPARISON:  None available FINDINGS: Lumbar spine: Alignment is within normal limits. Mild disc height loss at L5-S1. Mild endplate spurring at L4-L5. Vertebral body heights maintained. Mild to moderate facet degenerative changes seen throughout the lower lumbar spine. Sacrum/coccyx: No fracture or dislocation. Mild to moderate right and mild left hip osteoarthrosis. Mild irregularity and sclerosis of the right femoral head suspicious for avascular necrosis. IMPRESSION: 1. Mild degenerative changes of the lower lumbar spine. 2. No acute osseous abnormality of the sacrum/coccyx. 3. Mild to moderate right and mild left hip  osteoarthrosis. 4. Mild irregularity and sclerosis of the right femoral head suspicious for avascular necrosis. Electronically Signed   By: Elester Grim M.D.   On: 06/22/2023 11:09   DG Sacrum/Coccyx Result Date: 06/22/2023 CLINICAL DATA:  Back pain EXAM: LUMBAR SPINE - 2-3 VIEW; SACRUM AND COCCYX - 2+ VIEW COMPARISON:  None available FINDINGS: Lumbar spine: Alignment is within normal limits. Mild disc height loss at L5-S1. Mild endplate spurring at L4-L5. Vertebral body heights maintained. Mild to moderate facet degenerative changes seen throughout the lower lumbar spine. Sacrum/coccyx: No fracture or dislocation. Mild to moderate right and mild left hip osteoarthrosis. Mild irregularity and sclerosis of the right femoral head suspicious for avascular necrosis. IMPRESSION: 1. Mild degenerative changes of the lower lumbar spine. 2. No acute osseous abnormality of the sacrum/coccyx. 3. Mild to moderate right and mild left hip osteoarthrosis. 4. Mild irregularity and sclerosis of the right femoral head suspicious for avascular necrosis. Electronically Signed   By: Elester Grim M.D.   On: 06/22/2023 11:09       Eliodoro Gullett T. Zyrion Coey [facility]  If 7PM-7AM, please contact night-coverage [email or web address] 06/23/2023, 10:46 AM

## 2023-06-23 NOTE — TOC Initial Note (Signed)
 Transition of Care [facility]) - Initial/Assessment Note    Patient Details  Name: Charles [name] MRN: [id number] Date of Birth: 1970-09-04  Transition of Care [facility]) CM/SW Contact:    Jennett Model, RN Phone Number: 06/23/2023, 4:39 PM  Clinical Narrative:                 Homeless/shelter, has no PCP, Dr. Nida Barrow office said he is not a patient there , have not seen him in 3 years, [facility] and [facility] on file, states has no HH services in place at this time or DME.  States he will need a bus pass at  [employer] , states gets medications from [facility] pharmacy. Pta self ambulatory.  He states he will be going to [facility] office to get signed up with a Case worker to help him get housing.  Expected Discharge Plan: Homeless Shelter Barriers to Discharge: Continued Medical Work up   Patient Goals and CMS Choice Patient states their goals for this hospitalization and ongoing recovery are:: return to shelter   Choice offered to / list presented to : NA      Expected Discharge Plan and Services In-house Referral: NA Discharge Planning Services: CM Consult Post Acute Care Choice: NA Living arrangements for the past 2 months: Homeless                 DME Arranged: N/A DME Agency: NA       HH Arranged: NA          Prior Living Arrangements/Services Living arrangements for the past 2 months: Homeless Lives with::  (homeless) Patient language and need for interpreter reviewed:: Yes Do you feel safe going back to the place where you live?: Yes      Need for Family Participation in Patient Care: No (Comment) Care giver support system in place?: No (comment)   Criminal Activity/Legal Involvement Pertinent to Current Situation/Hospitalization: No - Comment as needed  Activities of Daily Living      Permission Sought/Granted Permission sought to share information with : Case Manager Permission granted to share information with : Yes, Verbal  Permission Granted              Emotional Assessment Appearance:: Appears stated age Attitude/Demeanor/Rapport: Engaged Affect (typically observed): Appropriate Orientation: : Oriented to Self, Oriented to Place, Oriented to  Time, Oriented to Situation Alcohol / Substance Use: Alcohol Use, Illicit Drugs Psych Involvement: No (comment)  Admission diagnosis:  Acute on chronic systolic (congestive) heart failure (HCC) [I50.23] Patient Active Problem List   Diagnosis Date Noted   Acute on chronic combined systolic and diastolic CHF (congestive heart failure) (HCC) 06/20/2023   Chronic anemia 06/20/2023   Rhabdomyolysis 06/08/2023   Acute exacerbation of CHF (congestive heart failure) (HCC) 05/29/2023   Acute on chronic HFrEF (heart failure with reduced ejection fraction) (HCC) 05/08/2023   Nausea vomiting and diarrhea 04/14/2023   Macrocytic anemia 02/22/2023   Chronic kidney disease, stage III (moderate) (HCC) 02/22/2023   Acute on chronic systolic CHF (congestive heart failure) (HCC) 01/27/2023   Prolonged QT interval 01/27/2023   Elevated blood pressure reading with diagnosis of hypertension 11/27/2022   Hypertensive crisis 11/26/2022   Generalized weakness 11/26/2022   Laceration of hand 11/16/2022   COPD (chronic obstructive pulmonary disease) (HCC) 10/03/2022   CHF (congestive heart failure) (HCC) 10/02/2022   Acute hypoxic respiratory failure (HCC) 09/03/2022   Chronic pain 08/13/2022   Acute on chronic congestive  heart failure (HCC) 08/10/2022   Essential hypertension 07/22/2022   Acute on chronic systolic congestive heart failure (HCC) 02/28/2022   Elevated troponin 02/28/2022   CKD (chronic kidney disease), stage II 02/28/2022   Cocaine abuse (HCC) 02/28/2022   Bacteremia 05/27/2021   Acute on chronic systolic and diastolic CHF (congestive heart failure) (HCC) 05/27/2021   Hypertensive urgency 05/26/2021   Polysubstance abuse (HCC) 05/26/2021   COPD with acute  exacerbation (HCC) 05/26/2021   Chest pain 05/26/2021   Chronic hepatitis C (HCC) 05/26/2021   Homelessness 05/26/2021   Seizure (HCC) 06/06/2020   Hypokalemia 06/22/2019   Splenic laceration, initial encounter 08/02/2016   Thrombocytopenia (HCC) 10/19/2014   Tobacco abuse 10/19/2014   Acute kidney injury superimposed on chronic kidney disease (HCC) 10/19/2014   PCP:  Jonathon Neighbors, MD Pharmacy:   [facility] [phone] - Jonette Nestle, [place] - [address] AT [facility] [address] [place] [address] Phone: [phone] Fax: [phone]  [id number] [place] Nest[place], [place] - [address] AT [facility] OF [address] [place] [address] Phone: [phone] Fax: [phone]  [facility] [address] [place] [address] Phone: [phone] Fax: [phone]  [facility] [address] [place] [address] Phone: [phone] Fax: [phone]  [facility] [address] [place] [address] Phone: [phone] Fax: [phone]     Social Drivers of Health (SDOH) Social History: SDOH Screenings   Food Insecurity: Food Insecurity Present (06/09/2023)  Housing: High Risk (06/09/2023)  Transportation Needs: Unmet Transportation Needs (06/09/2023)  Utilities: At Risk (06/09/2023)  Alcohol Screen: Medium Risk (02/24/2023)  Financial Resource Strain: High Risk (02/24/2023)  Social Connections: Unknown (09/24/2021)   Received from [facility]  Tobacco Use: High Risk (06/20/2023)   SDOH Interventions:     Readmission Risk Interventions    06/23/2023    4:32 PM 01/29/2023    8:43 AM 10/04/2022   11:27 AM  Readmission Risk Prevention Plan  Transportation Screening Complete Complete Complete  Medication Review Oceanographer) Complete Complete Complete   PCP or Specialist appointment within 3-5 days of discharge Complete Complete Complete  HRI or Home Care Consult Complete Complete Complete  SW Recovery Care/Counseling Consult Complete  Complete  Palliative Care Screening Not Applicable Not Applicable Not Applicable  Skilled Nursing Facility Not Applicable Not Applicable Not Applicable

## 2023-06-23 NOTE — Plan of Care (Signed)

## 2023-06-23 NOTE — Progress Notes (Addendum)
 SATURATION QUALIFICATIONS: (This note is used to comply with regulatory documentation for home oxygen)  Patient Saturations on Room Air at Rest = 99%  Patient Saturations on Room Air while Ambulating = 100%  Pt limited self to in-room mobility assessment only.

## 2023-06-23 NOTE — Evaluation (Signed)
 Occupational Therapy Evaluation/Discharge Patient Details Name: Charles [name] MRN: [id number] DOB: 12/24/1970 Today's Date: 06/23/2023   History of Present Illness Pt is a 53 y/o male presenting with complaints of SOB in setting of CHF exacerbation. Pt reports medication noncompliance and ongoing cocaine use. Recent hospitalization 1/26-1/29 d/t rhabdomyolysis. PMH: CHF, chronic cocaine abuse, HTN, chronically elevated troponin, aplastic anemia   Clinical Impression   PTA, pt with housing insecurity but reports Independence with ADLs, IADL and mobility mgmt in community without AD. Pt presents now at baseline for ADLs/mobility w/ pt agreeable for in-room mobility only though completed at an independent level. Pt reports some SOB with bathroom mobility during this admission but SpO2 99-100% on RA. Pt endorses medication noncompliance to this OT but not receptive to further discussions on routine modification to reduce risk of readmission. As pt at baseline for ADLs/mobility, OT will sign off acutely and defer OOB activity to mobility specialists while pt admitted.        If plan is discharge home, recommend the following:      Functional Status Assessment  Patient has not had a recent decline in their functional status  Equipment Recommendations  None recommended by OT    Recommendations for Other Services       Precautions / Restrictions Precautions Precautions: None Restrictions Weight Bearing Restrictions Per Provider Order: No      Mobility Bed Mobility Overal bed mobility: Independent                  Transfers Overall transfer level: Independent                        Balance Overall balance assessment: No apparent balance deficits (not formally assessed)                                         ADL either performed or assessed with clinical judgement   ADL Overall ADL's : Independent                                        General ADL Comments: reports he has been mobilizing to/from bathroom without issues but some SOB with it; Pt able to mobilize in room with OT Independently though declined out of room assessment for further O2/SOB observations. Assisted to order a second breakfast during session     Vision Ability to See in Adequate Light: 0 Adequate Patient Visual Report: No change from baseline Vision Assessment?: No apparent visual deficits     Perception         Praxis         Pertinent Vitals/Pain Pain Assessment Pain Assessment: Faces Faces Pain Scale: Hurts a little bit Pain Location: back Pain Descriptors / Indicators: Sore Pain Intervention(s): Monitored during session, Other (comment) (offered heat but pt declined and said they have medicine for it)     Extremity/Trunk Assessment Upper Extremity Assessment Upper Extremity Assessment: Overall WFL for tasks assessed;Right hand dominant   Lower Extremity Assessment Lower Extremity Assessment: Defer to PT evaluation   Cervical / Trunk Assessment Cervical / Trunk Assessment: Kyphotic   Communication Communication Communication: No apparent difficulties   Cognition Arousal: Alert Behavior During Therapy: WFL for tasks assessed/performed, Impulsive Overall Cognitive Status: Within Functional Limits for tasks  assessed                                 General Comments: likely at baseline     General Comments       Exercises     Shoulder Instructions      Home Living Family/patient expects to be discharged to:: Shelter/Homeless                                        Prior Functioning/Environment Prior Level of Function : Independent/Modified Independent             Mobility Comments: no AD ADLs Comments: Indep but did not elaborate on how he manages ADLs, community IADLs. pt reports he does not take any medications (reports he does not take fluid pills d/t religion; initially  reported Voodoo then reported Muslim)        OT Problem List:        OT Treatment/Interventions:      OT Goals(Current goals can be found in the care plan section) Acute Rehab OT Goals Patient Stated Goal: not be discharged too early this time OT Goal Formulation: All assessment and education complete, DC therapy  OT Frequency:      Co-evaluation              AM-PAC OT "6 Clicks" Daily Activity     Outcome Measure Help from another person eating meals?: None Help from another person taking care of personal grooming?: None Help from another person toileting, which includes using toliet, bedpan, or urinal?: None Help from another person bathing (including washing, rinsing, drying)?: None Help from another person to put on and taking off regular upper body clothing?: None Help from another person to put on and taking off regular lower body clothing?: None 6 Click Score: 24   End of Session Nurse Communication: Mobility status  Activity Tolerance: Patient tolerated treatment well Patient left: in bed;with call bell/phone within reach  OT Visit Diagnosis: Other abnormalities of gait and mobility (R26.89)                Time: 1610-9604 OT Time Calculation (min): 12 min Charges:  OT General Charges $OT Visit: 1 Visit OT Evaluation $OT Eval Low Complexity: 1 Low  Lawrence Pretty, OTR/L Acute Rehab Services Office: [phone]   Annabella Barr 06/23/2023, 8:55 AM

## 2023-06-24 ENCOUNTER — Other Ambulatory Visit (HOSPITAL_COMMUNITY): Payer: Self-pay

## 2023-06-24 DIAGNOSIS — D649 Anemia, unspecified: Secondary | ICD-10-CM | POA: Diagnosis not present

## 2023-06-24 DIAGNOSIS — I10 Essential (primary) hypertension: Secondary | ICD-10-CM | POA: Diagnosis not present

## 2023-06-24 DIAGNOSIS — R7989 Other specified abnormal findings of blood chemistry: Secondary | ICD-10-CM | POA: Diagnosis not present

## 2023-06-24 DIAGNOSIS — F1410 Cocaine abuse, uncomplicated: Secondary | ICD-10-CM | POA: Diagnosis not present

## 2023-06-24 DIAGNOSIS — I5043 Acute on chronic combined systolic (congestive) and diastolic (congestive) heart failure: Secondary | ICD-10-CM | POA: Diagnosis not present

## 2023-06-24 DIAGNOSIS — R9431 Abnormal electrocardiogram [ECG] [EKG]: Secondary | ICD-10-CM | POA: Diagnosis not present

## 2023-06-24 LAB — CBC
HCT: 37.9 % — ABNORMAL LOW (ref 39.0–52.0)
Hemoglobin: 12.6 g/dL — ABNORMAL LOW (ref 13.0–17.0)
MCH: 34 pg (ref 26.0–34.0)
MCHC: 33.2 g/dL (ref 30.0–36.0)
MCV: 102.2 fL — ABNORMAL HIGH (ref 80.0–100.0)
Platelets: 111 10*3/uL — ABNORMAL LOW (ref 150–400)
RBC: 3.71 MIL/uL — ABNORMAL LOW (ref 4.22–5.81)
RDW: 17.6 % — ABNORMAL HIGH (ref 11.5–15.5)
WBC: 2.7 10*3/uL — ABNORMAL LOW (ref 4.0–10.5)
nRBC: 0 % (ref 0.0–0.2)

## 2023-06-24 LAB — RENAL FUNCTION PANEL
Albumin: 2.8 g/dL — ABNORMAL LOW (ref 3.5–5.0)
Anion gap: 9 (ref 5–15)
BUN: 29 mg/dL — ABNORMAL HIGH (ref 6–20)
CO2: 25 mmol/L (ref 22–32)
Calcium: 8.7 mg/dL — ABNORMAL LOW (ref 8.9–10.3)
Chloride: 102 mmol/L (ref 98–111)
Creatinine, Ser: 1.43 mg/dL — ABNORMAL HIGH (ref 0.61–1.24)
GFR, Estimated: 59 mL/min — ABNORMAL LOW (ref >60)
Glucose, Bld: 142 mg/dL — ABNORMAL HIGH (ref 70–99)
Phosphorus: 2.8 mg/dL (ref 2.5–4.6)
Potassium: 4.4 mmol/L (ref 3.5–5.1)
Sodium: 136 mmol/L (ref 135–145)

## 2023-06-24 LAB — MAGNESIUM: Magnesium: 2.1 mg/dL (ref 1.7–2.4)

## 2023-06-24 MED ORDER — LOSARTAN POTASSIUM 25 MG PO TABS
25.0000 mg | ORAL_TABLET | Freq: Every day | ORAL | 0 refills | Status: DC
  Filled 2023-06-24: qty 30, 30d supply, fill #0

## 2023-06-24 MED ORDER — FUROSEMIDE 40 MG PO TABS
40.0000 mg | ORAL_TABLET | Freq: Every day | ORAL | 0 refills | Status: DC
  Filled 2023-06-24: qty 30, 30d supply, fill #0

## 2023-06-24 MED ORDER — SPIRONOLACTONE 25 MG PO TABS
25.0000 mg | ORAL_TABLET | Freq: Every day | ORAL | 0 refills | Status: DC
  Filled 2023-06-24: qty 30, 30d supply, fill #0

## 2023-06-24 MED ORDER — EMPAGLIFLOZIN 10 MG PO TABS
10.0000 mg | ORAL_TABLET | Freq: Every day | ORAL | 0 refills | Status: DC
  Filled 2023-06-24: qty 30, 30d supply, fill #0

## 2023-06-24 NOTE — Care Management Important Message (Signed)
Important Message  Patient Details  Name: Tad Fancher MRN: [id number] Date of Birth: Apr 21, 1971   Important Message Given:  Yes - Medicare IM     [name] 06/24/2023, 11:21 AM

## 2023-06-24 NOTE — Plan of Care (Signed)
Problem: Clinical Measurements: Goal: Will remain free from infection Outcome: Not Progressing   Problem: Clinical Measurements: Goal: Respiratory complications will improve Outcome: Not Progressing   Problem: Clinical Measurements: Goal: Cardiovascular complication will be avoided Outcome: Not Progressing

## 2023-06-24 NOTE — TOC Transition Note (Addendum)
Transition of Care [facility]) - Discharge Note   Patient Details  Name: Charles [name] MRN: [id number] Date of Birth: 1970/08/24  Transition of [facility]) CM/SW Contact:  Leone [place], RN Phone Number: 06/24/2023, 8:34 AM   Clinical Narrative:    For dc today, needs bus pass for transportation.  CSW to see patient also.  Patient stating he wants a cane, [employer] to deliver cane.   Final next level of care: Homeless Shelter Barriers to Discharge: Continued Medical Work up   Patient Goals and CMS Choice Patient states their goals for this hospitalization and ongoing recovery are:: return to shelter   Choice offered to / list presented to : NA      Discharge Placement                       Discharge Plan and Services Additional resources added to the After Visit Summary for   In-house Referral: NA Discharge Planning Services: CM Consult Post Acute Care Choice: NA          DME Arranged: N/A DME Agency: NA       HH Arranged: NA          Social Drivers of Health (SDOH) Interventions SDOH Screenings   Food Insecurity: Food Insecurity Present (06/09/2023)  Housing: High Risk (06/09/2023)  Transportation Needs: Unmet Transportation Needs (06/09/2023)  Utilities: At Risk (06/09/2023)  Alcohol Screen: Medium Risk (02/24/2023)  Financial Resource Strain: High Risk (02/24/2023)  Social Connections: Unknown (09/24/2021)   [facility]  Tobacco Use: High Risk (06/20/2023)     Readmission Risk Interventions    06/23/2023    4:32 PM 01/29/2023    8:43 AM 10/04/2022   11:27 AM  Readmission Risk Prevention Plan  Transportation Screening Complete Complete Complete  Medication Review Oceanographer) Complete Complete Complete  PCP or Specialist appointment within 3-5 days of discharge Complete Complete Complete  HRI or Home Care Consult Complete Complete Complete  SW Recovery Care/Counseling Consult Complete  Complete  Palliative Care  Screening Not Applicable Not Applicable Not Applicable  Skilled Nursing Facility Not Applicable Not Applicable Not Applicable

## 2023-06-24 NOTE — TOC Progression Note (Signed)
Transition of Care [facility]) - Progression Note    Patient Details  Name: Charles [name] MRN: [id number] Date of Birth: 01/09/1971  [facility] CM/SW Contact  Michaela Corner, [place] Phone Number: 06/24/2023, 8:36 AM  Clinical Narrative:   CSW met pt at bedside and gave him substance use and housing resources. Pt accepted resources at this time.   Expected Discharge Plan: Homeless Shelter Barriers to Discharge: Continued Medical Work up  Expected Discharge Plan and Services In-house Referral: NA Discharge Planning Services: CM Consult Post Acute Care Choice: NA Living arrangements for the past 2 months: Homeless Expected Discharge Date: 06/24/23               DME Arranged: N/A DME Agency: NA       HH Arranged: NA           Social Determinants of Health (SDOH) Interventions SDOH Screenings   Food Insecurity: Food Insecurity Present (06/09/2023)  Housing: High Risk (06/09/2023)  Transportation Needs: Unmet Transportation Needs (06/09/2023)  Utilities: At Risk (06/09/2023)  Alcohol Screen: Medium Risk (02/24/2023)  Financial Resource Strain: High Risk (02/24/2023)  Social Connections: Unknown (09/24/2021)   Received from [facility]  Tobacco Use: High Risk (06/20/2023)    Readmission Risk Interventions    06/23/2023    4:32 PM 01/29/2023    8:43 AM 10/04/2022   11:27 AM  Readmission Risk Prevention Plan  Transportation Screening Complete Complete Complete  Medication Review Oceanographer) Complete Complete Complete  PCP or Specialist appointment within 3-5 days of discharge Complete Complete Complete  HRI or Home Care Consult Complete Complete Complete  SW Recovery Care/Counseling Consult Complete  Complete  Palliative Care Screening Not Applicable Not Applicable Not Applicable  Skilled Nursing Facility Not Applicable Not Applicable Not Applicable

## 2023-06-24 NOTE — Progress Notes (Signed)
Mobility Specialist Progress Note:   06/24/23 0945  Mobility  Activity Ambulated independently in hallway  Level of Assistance Independent  Assistive Device None  Distance Ambulated (ft) 200 ft  Activity Response Tolerated well  Mobility Referral Yes  Mobility visit 1 Mobility  Mobility Specialist Start Time (ACUTE ONLY) [id number]  Mobility Specialist Stop Time (ACUTE ONLY) 0948  Mobility Specialist Time Calculation (min) (ACUTE ONLY) 10 min   Pt able to mobilize independently throughout hallway. No c/o throughout. Pt back in room with all needs met.  Addison Lank Mobility Specialist Please contact via SecureChat or  Rehab office at [phone]

## 2023-06-24 NOTE — Discharge Summary (Signed)
Physician Discharge Summary  Charles [name] WJX:[id number] DOB: Jan 16, 1971 DOA: 06/20/2023  PCP: Renaye Rakers, MD  Admit date: 06/20/2023 Discharge date: 06/24/23  Admitted From: Homeless shelter Disposition: Homeless shelter Recommendations for Outpatient Follow-up:  Outpatient follow-up with PCP as below Check blood pressure, fluid status, CMP and CBC at follow-up Encourage compliance with medication and cocaine cessation. Please follow up on the following pending results: None  Home Health: No needed identified. Equipment/Devices: Cane  Discharge Condition: Stable CODE STATUS: Full code  Follow-up Information     [facility] Follow up.   Why: GO: FEB. 21 1:20PM WITH MIRRIAY, LORA PA [address] Contact information: [address] [place] [address]        [facility] Follow up.   Why: cane Contact information: [phone]                Hospital course 53 year old M with PMH of combined CHF/BiV failure, chronic cocaine use, HTN and recent hospitalization for 1/26-1/29 returning with SOB, orthopnea, edema for 3 to 4 days in the setting of ongoing cocaine use and noncompliance with home medications, and admitted with acute on chronic CHF.  BNP was elevated to 2500.  Portable CXR with cardiomegaly and increased pulmonary vascular congestion consistent with CHF.  Troponin is slightly elevated without significant delta.  EKG showed sinus rhythm, prolonged QTc to 500 and nonspecific T wave changes which appears to be chronic.   Patient was started on IV Lasix and admitted.  Patient diuresed well with IV Lasix.  I&O incomplete but net -5.4 L.  He developed mild AKI.  Diuretics held.  AKI improved.  He is discharged on home Lasix, Aldactone, Jardiance and losartan.  Extensively counseled on the importance of cocaine cessation and compliance with his medications.  Unfortunately, patient does not believe cocaine use is causing problem  on his health  See individual problem list below for more.   Problems addressed during this hospitalization Acute on chronic combined CHF: Likely due to noncompliance and ongoing cocaine use.  TTE in 01/2023 with LVEF of 25 to 30%, GH, G2 DD, mildly reduced RVSF.  Presents with SOB, orthopnea and edema.  Diuresed with IV Lasix.  Net -5.4 L.  Developed mild AKI that has resolved after holding diuretics.  -Discharge on home medications including Lasix, Jardiance, Aldactone and losartan.  Prescription filled at [facility]. -Counseled on the importance of compliance with medication and cocaine cessation but patient is reluctant -Pharmacy reviewed discharge medications with patient before discharge.     Chronically elevated troponin: Likely demand ischemia/type II MI in the setting of CHF and cocaine use. -Management as above   Chronic cocaine abuse: Unfortunately this is an ongoing issue.  He does not believe this is affecting his heart.  It is said that he believes cocaine is helping. -Avoid selective beta-blockers.   AKI on CKD-3A: Baseline Cr 1.4-1.5.  AKI likely due to diuretics.  Resolved. -Recheck renal function at follow-up     Essential hypertension: BP tends to fluctuate. -Cardiac meds as above.   Noncompliance -Counseled but this is a persistent issue.   Chronic anemia: Anemia ruled out.   Low back pain: Ongoing for about 2 weeks.  No trauma or fall.  Exam reassuring.  Lumbar and sacral films without acute finding. -Tylenol as needed.   -Continue home gabapentin   Homelessness -Discharge to homeless shelter.  Leukopenia/thrombocytopenia: Due to substance abuse? -Recheck CBC at follow-up.  Time spent 35 minutes  Vital signs Vitals:   06/23/23 1939 06/24/23 0003 06/24/23 0516 06/24/23 0809  BP: (!) 144/95 133/66 (!) 143/102 (!) 138/96  Pulse: 87 83 93 83  Temp: 98.6 F (37 C) 98.3 F (36.8 C) Comment: pt had ice 98 F (36.7 C)  Resp: 17 18 17 18    Weight:   72.2 kg   SpO2: 100% 96% 100% 99%  TempSrc: Oral Oral  Oral  BMI (Calculated):   25.7      Discharge exam  GENERAL: No apparent distress.  Nontoxic. HEENT: MMM.  Vision and hearing grossly intact.  NECK: Supple.  No apparent JVD.  RESP:  No IWOB.  Fair aeration bilaterally. CVS:  RRR. Heart sounds normal.  ABD/GI/GU: BS+. Abd soft, NTND.  MSK/EXT:  Moves extremities. No apparent deformity. No edema.  SKIN: no apparent skin lesion or wound NEURO: Awake and alert. Oriented appropriately.  No apparent focal neuro deficit. PSYCH: Calm. Normal affect.  Discharge Instructions Discharge Instructions     Diet - low sodium heart healthy   Complete by: As directed    Discharge instructions   Complete by: As directed    It has been a pleasure taking care of you!  You were hospitalized due to heart failure exacerbation likely due to cocaine use are not taking new medications.  We strongly recommend you refrain from using cocaine and take your medications as prescribed.  Follow-up with your primary care doctor in 1 to 2 weeks or sooner if needed.   Take care,   Increase activity slowly   Complete by: As directed       Allergies as of 06/24/2023       Reactions   Banana Nausea And Vomiting   Egg-derived Products Nausea And Vomiting   Pork-derived Products Other (See Comments)   No pork products d/t Religious preference         Medication List     STOP taking these medications    torsemide 100 MG tablet Commonly known as: DEMADEX       TAKE these medications    albuterol 108 (90 Base) MCG/ACT inhaler Commonly known as: VENTOLIN HFA Inhale 2 puffs into the lungs every 4 (four) hours as needed for wheezing or shortness of breath.   furosemide 40 MG tablet Commonly known as: LASIX Take 1 tablet (40 mg total) by mouth daily.   gabapentin 100 MG capsule Commonly known as: NEURONTIN Take 1 capsule (100 mg total) by mouth 2 (two) times daily.   Jardiance  10 MG Tabs tablet Generic drug: empagliflozin Take 1 tablet (10 mg total) by mouth daily.   losartan 25 MG tablet Commonly known as: COZAAR Take 1 tablet (25 mg total) by mouth daily.   spironolactone 25 MG tablet Commonly known as: ALDACTONE Take 1 tablet (25 mg total) by mouth daily.               Durable Medical Equipment  (From admission, onward)           Start     Ordered   06/24/23 0922  For home use only DME Cane  Once        06/24/23 0921            Consultations: None  Procedures/Studies:   DG Lumbar Spine 2-3 Views Result Date: 06/22/2023 CLINICAL DATA:  Back pain EXAM: LUMBAR SPINE - 2-3 VIEW; SACRUM AND COCCYX - 2+ VIEW COMPARISON:  None available FINDINGS: Lumbar spine: Alignment is  within normal limits. Mild disc height loss at L5-S1. Mild endplate spurring at L4-L5. Vertebral body heights maintained. Mild to moderate facet degenerative changes seen throughout the lower lumbar spine. Sacrum/coccyx: No fracture or dislocation. Mild to moderate right and mild left hip osteoarthrosis. Mild irregularity and sclerosis of the right femoral head suspicious for avascular necrosis. IMPRESSION: 1. Mild degenerative changes of the lower lumbar spine. 2. No acute osseous abnormality of the sacrum/coccyx. 3. Mild to moderate right and mild left hip osteoarthrosis. 4. Mild irregularity and sclerosis of the right femoral head suspicious for avascular necrosis. Electronically Signed   By: Acquanetta Belling M.D.   On: 06/22/2023 11:09   DG Sacrum/Coccyx Result Date: 06/22/2023 CLINICAL DATA:  Back pain EXAM: LUMBAR SPINE - 2-3 VIEW; SACRUM AND COCCYX - 2+ VIEW COMPARISON:  None available FINDINGS: Lumbar spine: Alignment is within normal limits. Mild disc height loss at L5-S1. Mild endplate spurring at L4-L5. Vertebral body heights maintained. Mild to moderate facet degenerative changes seen throughout the lower lumbar spine. Sacrum/coccyx: No fracture or dislocation. Mild to  moderate right and mild left hip osteoarthrosis. Mild irregularity and sclerosis of the right femoral head suspicious for avascular necrosis. IMPRESSION: 1. Mild degenerative changes of the lower lumbar spine. 2. No acute osseous abnormality of the sacrum/coccyx. 3. Mild to moderate right and mild left hip osteoarthrosis. 4. Mild irregularity and sclerosis of the right femoral head suspicious for avascular necrosis. Electronically Signed   By: Acquanetta Belling M.D.   On: 06/22/2023 11:09   DG Chest Portable 1 View Result Date: 06/20/2023 CLINICAL DATA:  Dyspnea EXAM: PORTABLE CHEST 1 VIEW COMPARISON:  Chest x-ray 06/08/2023 FINDINGS: Heart is enlarged. There central pulmonary vascular congestion. There is no focal lung infiltrate, pleural effusion or pneumothorax. No acute fractures are seen. IMPRESSION: Cardiomegaly with central pulmonary vascular congestion. Electronically Signed   By: Darliss Cheney M.D.   On: 06/20/2023 17:46   CT HEAD WO CONTRAST ( ) Result Date: 06/09/2023 CLINICAL DATA:  53 year old male altered mental status. EXAM: CT HEAD WITHOUT CONTRAST TECHNIQUE: Contiguous axial images were obtained from the base of the skull through the vertex without intravenous contrast. RADIATION DOSE REDUCTION: This exam was performed according to the departmental dose-optimization program which includes automated exposure control, adjustment of the mA and/or kV according to patient size and/or use of iterative reconstruction technique. COMPARISON:  Head CT 11/01/2021. brain MRI 06/06/2020. FINDINGS: Brain: Cerebral volume remains normal. No midline shift, ventriculomegaly, mass effect, evidence of mass lesion, intracranial hemorrhage or evidence of cortically based acute infarction. Gray-white matter differentiation is within normal limits throughout the brain. Vascular: Calcified atherosclerosis at the skull base. No suspicious intracranial vascular hyperdensity. Skull: Chronic left lamina papyracea fracture.  Elongated bilateral stylohyoid ligament calcification. Carious maxillary dentition. No acute osseous abnormality identified. Sinuses/Orbits: Stable generally well aerated paranasal sinuses, tympanic cavities, mastoids. Scattered ethmoid mucosal thickening. Other: No acute orbit or scalp soft tissue finding. IMPRESSION: 1. Stable and normal for age noncontrast CT appearance of the brain. 2. Chronic left lamina papyracea fracture. Carious maxillary dentition. Electronically Signed   By: Odessa Fleming M.D.   On: 06/09/2023 04:27   DG CHEST PORT 1 VIEW Result Date: 06/08/2023 CLINICAL DATA:  [id number] Hypoxia [id number] EXAM: PORTABLE CHEST 1 VIEW COMPARISON:  Chest x-ray 06/08/2023, chest x-ray 01/27/2023 FINDINGS: Patient is rotated Cardiomegaly. Nonvisualization of the right heart border may be due to patient rotation. Otherwise heart and mediastinal contours are within normal limits. Question right middle lobe airspace opacity. Mild  pulmonary edema. No pleural effusion. No pneumothorax. No acute osseous abnormality. IMPRESSION: 1. Markedly rotated chest x-ray with query developing right middle lobe airspace opacity. 2. Cardiomegaly with mild pulmonary edema. Electronically Signed   By: Tish Frederickson M.D.   On: 06/08/2023 23:58   DG Chest 2 View Result Date: 06/08/2023 CLINICAL DATA:  Chest pain EXAM: CHEST - 2 VIEW COMPARISON:  Chest radiograph 05/29/2023 FINDINGS: Cardiomegaly. Elevation right hemidiaphragm. Pulmonary vascular redistribution and mild interstitial edema. No pleural effusion. Thoracic spine degenerative changes. Coil projects over the left upper quadrant. IMPRESSION: Cardiomegaly with mild interstitial edema. Electronically Signed   By: Annia Belt M.D.   On: 06/08/2023 16:14   DG Chest Port 1 View Result Date: 05/29/2023 CLINICAL DATA:  Shortness of breath. EXAM: PORTABLE CHEST 1 VIEW COMPARISON:  Chest radiograph dated May 08, 2023. FINDINGS: Cardiomegaly with pulmonary vascular congestion. No  overt pulmonary edema. No focal consolidation, sizeable pleural effusion, or pneumothorax. No acute osseous abnormality. IMPRESSION: Cardiomegaly with pulmonary vascular congestion. No focal consolidation. Electronically Signed   By: Hart Robinsons M.D.   On: 05/29/2023 11:52       The results of significant diagnostics from this hospitalization (including imaging, microbiology, ancillary and laboratory) are listed below for reference.     Microbiology: Recent Results (from the past 240 hours)  Resp panel by RT-PCR (RSV, Flu A&B, Covid) Anterior Nasal Swab     Status: None   Collection Time: 06/20/23  4:13 PM   Specimen: Anterior Nasal Swab  Result Value Ref Range Status   SARS Coronavirus 2 by RT PCR NEGATIVE NEGATIVE Final   Influenza A by PCR NEGATIVE NEGATIVE Final   Influenza B by PCR NEGATIVE NEGATIVE Final    Comment: (NOTE) The Xpert Xpress SARS-CoV-2/FLU/RSV plus assay is intended as an aid in the diagnosis of influenza from Nasopharyngeal swab specimens and should not be used as a sole basis for treatment. Nasal washings and aspirates are unacceptable for Xpert Xpress SARS-CoV-2/FLU/RSV testing.  Fact Sheet for Patients: BloggerCourse.com  Fact Sheet for Healthcare Providers: SeriousBroker.it  This test is not yet approved or cleared by the [place] FDA and has been authorized for detection and/or diagnosis of SARS-CoV-2 by FDA under an Emergency Use Authorization (EUA). This EUA will remain in effect (meaning this test can be used) for the duration of the COVID-19 declaration under Section 564(b)(1) of the Act, 21 U.S.C. section 360bbb-3(b)(1), unless the authorization is terminated or revoked.     Resp Syncytial Virus by PCR NEGATIVE NEGATIVE Final    Comment: (NOTE) Fact Sheet for Patients: BloggerCourse.com  Fact Sheet for Healthcare  Providers: SeriousBroker.it  This test is not yet approved or cleared by the [place] FDA and has been authorized for detection and/or diagnosis of SARS-CoV-2 by FDA under an Emergency Use Authorization (EUA). This EUA will remain in effect (meaning this test can be used) for the duration of the COVID-19 declaration under Section 564(b)(1) of the Act, 21 U.S.C. section 360bbb-3(b)(1), unless the authorization is terminated or revoked.  Performed at [facility], [address], [place], [place] [address]      Labs:  CBC: Recent Labs  Lab 06/20/23 1611 06/21/23 0228 06/22/23 0302 06/23/23 0311 06/24/23 0240  WBC 5.0 4.8 2.8* 3.3* 2.7*  NEUTROABS 3.9 3.5  --  1.6*  --   HGB 11.9* 13.1 13.6 14.4 12.6*  HCT 37.3* 39.5 40.3 41.9 37.9*  MCV 105.1* 102.1* 101.5* 100.7* 102.2*  PLT 122* 134* 109*  127* 111*   BMP &GFR Recent Labs  Lab 06/20/23 1611 06/21/23 0228 06/22/23 0302 06/23/23 0311 06/24/23 0240  NA 139 139 136 136 136  K 4.3 3.8 4.1 4.0 4.4  CL 105 101 98 96* 102  CO2 25 27 28 27 25   GLUCOSE 93 132* 114* 103* 142*  BUN 13 17 26* 35* 29*  CREATININE 1.43* 1.57* 1.45* 1.86* 1.43*  CALCIUM 8.8* 8.9 9.0 9.0 8.7*  MG  --  1.9 2.0 2.0 2.1  PHOS  --   --  3.5 4.1 2.8   Estimated Creatinine Clearance: 54.5 mL/min (A) (by C-G formula based on SCr of 1.43 mg/dL (H)). Liver & Pancreas: Recent Labs  Lab 06/20/23 1611 06/21/23 0228 06/22/23 0302 06/23/23 0311 06/24/23 0240  AST 37 35  --   --   --   ALT 32 33  --   --   --   ALKPHOS 61 67  --   --   --   BILITOT 1.0 1.0  --   --   --   PROT 6.6 6.9  --   --   --   ALBUMIN 3.1* 3.2* 2.9* 3.0* 2.8*   No results for input(s): "LIPASE", "AMYLASE" in the last 168 hours. No results for input(s): "AMMONIA" in the last 168 hours. Diabetic: No results for input(s): "HGBA1C" in the last 72 hours. No results for input(s): "GLUCAP" in the last 168 hours. Cardiac Enzymes: No results  for input(s): "CKTOTAL", "CKMB", "CKMBINDEX", "TROPONINI" in the last 168 hours. No results for input(s): "PROBNP" in the last 8760 hours. Coagulation Profile: Recent Labs  Lab 06/21/23 0228  INR 1.0   Thyroid Function Tests: No results for input(s): "TSH", "T4TOTAL", "FREET4", "T3FREE", "THYROIDAB" in the last 72 hours. Lipid Profile: No results for input(s): "CHOL", "HDL", "LDLCALC", "TRIG", "CHOLHDL", "LDLDIRECT" in the last 72 hours. Anemia Panel: No results for input(s): "VITAMINB12", "FOLATE", "FERRITIN", "TIBC", "IRON", "RETICCTPCT" in the last 72 hours. Urine analysis:    Component Value Date/Time   COLORURINE YELLOW 06/08/2023 2138   APPEARANCEUR CLEAR 06/08/2023 2138   LABSPEC 1.013 06/08/2023 2138   PHURINE 5.0 06/08/2023 2138   GLUCOSEU NEGATIVE 06/08/2023 2138   HGBUR LARGE (A) 06/08/2023 2138   BILIRUBINUR NEGATIVE 06/08/2023 2138   KETONESUR NEGATIVE 06/08/2023 2138   PROTEINUR 30 (A) 06/08/2023 2138   UROBILINOGEN 1.0 03/10/2015 1203   NITRITE NEGATIVE 06/08/2023 2138   LEUKOCYTESUR NEGATIVE 06/08/2023 2138   Sepsis Labs: Invalid input(s): "PROCALCITONIN", "LACTICIDVEN"   SIGNED:  Almon Hercules, MD  [facility] 06/24/2023, 11:52 AM

## 2023-06-25 ENCOUNTER — Encounter: Payer: Self-pay | Admitting: *Deleted

## 2023-06-25 NOTE — Telephone Encounter (Signed)
This encounter was created in error - please disregard.

## 2023-07-03 ENCOUNTER — Encounter (HOSPITAL_COMMUNITY): Payer: Self-pay

## 2023-07-03 ENCOUNTER — Other Ambulatory Visit: Payer: Self-pay

## 2023-07-03 ENCOUNTER — Emergency Department (HOSPITAL_COMMUNITY): Payer: Medicare HMO

## 2023-07-03 ENCOUNTER — Inpatient Hospital Stay (HOSPITAL_COMMUNITY)
Admission: EM | Admit: 2023-07-03 | Discharge: 2023-07-07 | DRG: 291 | Disposition: A | Discharge status: Home / Self Care | Payer: Medicare HMO | Attending: Internal Medicine | Admitting: Internal Medicine

## 2023-07-03 DIAGNOSIS — Z801 Family history of malignant neoplasm of trachea, bronchus and lung: Secondary | ICD-10-CM | POA: Diagnosis not present

## 2023-07-03 DIAGNOSIS — Z800 Family history of malignant neoplasm of digestive organs: Secondary | ICD-10-CM | POA: Diagnosis not present

## 2023-07-03 DIAGNOSIS — I110 Hypertensive heart disease with heart failure: Secondary | ICD-10-CM | POA: Diagnosis not present

## 2023-07-03 DIAGNOSIS — N189 Chronic kidney disease, unspecified: Secondary | ICD-10-CM | POA: Diagnosis not present

## 2023-07-03 DIAGNOSIS — N1832 Chronic kidney disease, stage 3b: Secondary | ICD-10-CM | POA: Diagnosis present

## 2023-07-03 DIAGNOSIS — D631 Anemia in chronic kidney disease: Secondary | ICD-10-CM | POA: Diagnosis present

## 2023-07-03 DIAGNOSIS — Z1152 Encounter for screening for COVID-19: Secondary | ICD-10-CM | POA: Diagnosis not present

## 2023-07-03 DIAGNOSIS — D61818 Other pancytopenia: Secondary | ICD-10-CM

## 2023-07-03 DIAGNOSIS — Z91014 Allergy to mammalian meats: Secondary | ICD-10-CM | POA: Diagnosis not present

## 2023-07-03 DIAGNOSIS — R609 Edema, unspecified: Secondary | ICD-10-CM | POA: Diagnosis not present

## 2023-07-03 DIAGNOSIS — R9431 Abnormal electrocardiogram [ECG] [EKG]: Secondary | ICD-10-CM

## 2023-07-03 DIAGNOSIS — R7989 Other specified abnormal findings of blood chemistry: Secondary | ICD-10-CM

## 2023-07-03 DIAGNOSIS — I5082 Biventricular heart failure: Secondary | ICD-10-CM | POA: Diagnosis present

## 2023-07-03 DIAGNOSIS — D638 Anemia in other chronic diseases classified elsewhere: Secondary | ICD-10-CM

## 2023-07-03 DIAGNOSIS — Z9109 Other allergy status, other than to drugs and biological substances: Secondary | ICD-10-CM

## 2023-07-03 DIAGNOSIS — F17210 Nicotine dependence, cigarettes, uncomplicated: Secondary | ICD-10-CM | POA: Diagnosis present

## 2023-07-03 DIAGNOSIS — I5043 Acute on chronic combined systolic (congestive) and diastolic (congestive) heart failure: Secondary | ICD-10-CM | POA: Diagnosis not present

## 2023-07-03 DIAGNOSIS — I509 Heart failure, unspecified: Secondary | ICD-10-CM | POA: Diagnosis not present

## 2023-07-03 DIAGNOSIS — E871 Hypo-osmolality and hyponatremia: Secondary | ICD-10-CM | POA: Diagnosis not present

## 2023-07-03 DIAGNOSIS — F1910 Other psychoactive substance abuse, uncomplicated: Secondary | ICD-10-CM | POA: Diagnosis not present

## 2023-07-03 DIAGNOSIS — F1410 Cocaine abuse, uncomplicated: Secondary | ICD-10-CM | POA: Diagnosis not present

## 2023-07-03 DIAGNOSIS — N179 Acute kidney failure, unspecified: Secondary | ICD-10-CM | POA: Diagnosis not present

## 2023-07-03 DIAGNOSIS — J449 Chronic obstructive pulmonary disease, unspecified: Secondary | ICD-10-CM | POA: Diagnosis present

## 2023-07-03 DIAGNOSIS — Z91012 Allergy to eggs: Secondary | ICD-10-CM | POA: Diagnosis not present

## 2023-07-03 DIAGNOSIS — Z79899 Other long term (current) drug therapy: Secondary | ICD-10-CM | POA: Diagnosis not present

## 2023-07-03 DIAGNOSIS — I5023 Acute on chronic systolic (congestive) heart failure: Secondary | ICD-10-CM | POA: Diagnosis not present

## 2023-07-03 DIAGNOSIS — I130 Hypertensive heart and chronic kidney disease with heart failure and stage 1 through stage 4 chronic kidney disease, or unspecified chronic kidney disease: Principal | ICD-10-CM | POA: Diagnosis present

## 2023-07-03 DIAGNOSIS — F14129 Cocaine abuse with intoxication, unspecified: Secondary | ICD-10-CM | POA: Diagnosis present

## 2023-07-03 DIAGNOSIS — I10 Essential (primary) hypertension: Secondary | ICD-10-CM | POA: Diagnosis present

## 2023-07-03 DIAGNOSIS — I517 Cardiomegaly: Secondary | ICD-10-CM | POA: Diagnosis not present

## 2023-07-03 DIAGNOSIS — R0602 Shortness of breath: Secondary | ICD-10-CM | POA: Diagnosis not present

## 2023-07-03 DIAGNOSIS — R0609 Other forms of dyspnea: Secondary | ICD-10-CM | POA: Diagnosis not present

## 2023-07-03 HISTORY — DX: Chronic combined systolic (congestive) and diastolic (congestive) heart failure: I50.42

## 2023-07-03 LAB — CBC WITH DIFFERENTIAL/PLATELET
Abs Immature Granulocytes: 0.01 10*3/uL (ref 0.00–0.07)
Abs Immature Granulocytes: 0.01 10*3/uL (ref 0.00–0.07)
Basophils Absolute: 0 10*3/uL (ref 0.0–0.1)
Basophils Absolute: 0 10*3/uL (ref 0.0–0.1)
Basophils Relative: 0 %
Basophils Relative: 0 %
Eosinophils Absolute: 0 10*3/uL (ref 0.0–0.5)
Eosinophils Absolute: 0 10*3/uL (ref 0.0–0.5)
Eosinophils Relative: 1 %
Eosinophils Relative: 1 %
HCT: 32.8 % — ABNORMAL LOW (ref 39.0–52.0)
HCT: 33.9 % — ABNORMAL LOW (ref 39.0–52.0)
Hemoglobin: 11.2 g/dL — ABNORMAL LOW (ref 13.0–17.0)
Hemoglobin: 11.1 g/dL — ABNORMAL LOW (ref 13.0–17.0)
Immature Granulocytes: 0 %
Immature Granulocytes: 0 %
Lymphocytes Relative: 36 %
Lymphocytes Relative: 35 %
Lymphs Abs: 1.3 10*3/uL (ref 0.7–4.0)
Lymphs Abs: 1.3 10*3/uL (ref 0.7–4.0)
MCH: 34.7 pg — ABNORMAL HIGH (ref 26.0–34.0)
MCH: 33.8 pg (ref 26.0–34.0)
MCHC: 34.1 g/dL (ref 30.0–36.0)
MCHC: 32.7 g/dL (ref 30.0–36.0)
MCV: 101.5 fL — ABNORMAL HIGH (ref 80.0–100.0)
MCV: 103.4 fL — ABNORMAL HIGH (ref 80.0–100.0)
Monocytes Absolute: 0.7 10*3/uL (ref 0.1–1.0)
Monocytes Absolute: 0.7 10*3/uL (ref 0.1–1.0)
Monocytes Relative: 19 %
Monocytes Relative: 18 %
Neutro Abs: 1.6 10*3/uL — ABNORMAL LOW (ref 1.7–7.7)
Neutro Abs: 1.7 10*3/uL (ref 1.7–7.7)
Neutrophils Relative %: 44 %
Neutrophils Relative %: 46 %
Platelets: 103 10*3/uL — ABNORMAL LOW (ref 150–400)
Platelets: 105 10*3/uL — ABNORMAL LOW (ref 150–400)
RBC: 3.23 MIL/uL — ABNORMAL LOW (ref 4.22–5.81)
RBC: 3.28 MIL/uL — ABNORMAL LOW (ref 4.22–5.81)
RDW: 18.3 % — ABNORMAL HIGH (ref 11.5–15.5)
RDW: 18.5 % — ABNORMAL HIGH (ref 11.5–15.5)
WBC: 3.7 10*3/uL — ABNORMAL LOW (ref 4.0–10.5)
WBC: 3.7 10*3/uL — ABNORMAL LOW (ref 4.0–10.5)
nRBC: 0 % (ref 0.0–0.2)
nRBC: 0 % (ref 0.0–0.2)

## 2023-07-03 LAB — COMPREHENSIVE METABOLIC PANEL
ALT: 33 U/L (ref 0–44)
ALT: 30 U/L (ref 0–44)
AST: 41 U/L (ref 15–41)
AST: 39 U/L (ref 15–41)
Albumin: 3.4 g/dL — ABNORMAL LOW (ref 3.5–5.0)
Albumin: 3.2 g/dL — ABNORMAL LOW (ref 3.5–5.0)
Alkaline Phosphatase: 56 U/L (ref 38–126)
Alkaline Phosphatase: 55 U/L (ref 38–126)
Anion gap: 9 (ref 5–15)
Anion gap: 11 (ref 5–15)
BUN: 26 mg/dL — ABNORMAL HIGH (ref 6–20)
BUN: 24 mg/dL — ABNORMAL HIGH (ref 6–20)
CO2: 21 mmol/L — ABNORMAL LOW (ref 22–32)
CO2: 22 mmol/L (ref 22–32)
Calcium: 9.1 mg/dL (ref 8.9–10.3)
Calcium: 8.8 mg/dL — ABNORMAL LOW (ref 8.9–10.3)
Chloride: 108 mmol/L (ref 98–111)
Chloride: 106 mmol/L (ref 98–111)
Creatinine, Ser: 1.74 mg/dL — ABNORMAL HIGH (ref 0.61–1.24)
Creatinine, Ser: 1.84 mg/dL — ABNORMAL HIGH (ref 0.61–1.24)
GFR, Estimated: 47 mL/min — ABNORMAL LOW (ref >60)
GFR, Estimated: 44 mL/min — ABNORMAL LOW (ref >60)
Glucose, Bld: 92 mg/dL (ref 70–99)
Glucose, Bld: 110 mg/dL — ABNORMAL HIGH (ref 70–99)
Potassium: 4.1 mmol/L (ref 3.5–5.1)
Potassium: 4 mmol/L (ref 3.5–5.1)
Sodium: 138 mmol/L (ref 135–145)
Sodium: 139 mmol/L (ref 135–145)
Total Bilirubin: 1 mg/dL (ref 0.0–1.2)
Total Bilirubin: 0.9 mg/dL (ref 0.0–1.2)
Total Protein: 6.7 g/dL (ref 6.5–8.1)
Total Protein: 6.6 g/dL (ref 6.5–8.1)

## 2023-07-03 LAB — RAPID URINE DRUG SCREEN, HOSP PERFORMED
Amphetamines: NOT DETECTED
Barbiturates: NOT DETECTED
Benzodiazepines: NOT DETECTED
Cocaine: POSITIVE — AB
Opiates: NOT DETECTED
Tetrahydrocannabinol: NOT DETECTED

## 2023-07-03 LAB — TROPONIN I (HIGH SENSITIVITY)
Troponin I (High Sensitivity): 101 ng/L (ref <18)
Troponin I (High Sensitivity): 89 ng/L — ABNORMAL HIGH (ref <18)
Troponin I (High Sensitivity): 74 ng/L — ABNORMAL HIGH (ref <18)

## 2023-07-03 LAB — MAGNESIUM: Magnesium: 1.9 mg/dL (ref 1.7–2.4)

## 2023-07-03 LAB — BRAIN NATRIURETIC PEPTIDE: B Natriuretic Peptide: 1597.4 pg/mL — ABNORMAL HIGH (ref 0.0–100.0)

## 2023-07-03 LAB — ETHANOL: Alcohol, Ethyl (B): <10 mg/dL (ref <10)

## 2023-07-03 MED ORDER — LOSARTAN POTASSIUM 25 MG PO TABS
25.0000 mg | ORAL_TABLET | Freq: Every day | ORAL | Status: DC
Start: 2023-07-04 — End: 2023-07-07
  Administered 2023-07-04 – 2023-07-07 (×4): 25 mg via ORAL
  Filled 2023-07-03 (×4): qty 1

## 2023-07-03 MED ORDER — HYDROMORPHONE HCL 1 MG/ML IJ SOLN
0.5000 mg | Freq: Once | INTRAMUSCULAR | Status: AC
  Administered 2023-07-03: 0.5 mg via INTRAVENOUS
  Filled 2023-07-03: qty 1

## 2023-07-03 MED ORDER — MELATONIN 3 MG PO TABS: 3.0000 mg | ORAL_TABLET | Freq: Every evening | ORAL | Status: DC | PRN

## 2023-07-03 MED ORDER — NALOXONE HCL 0.4 MG/ML IJ SOLN: 0.4000 mg | INTRAMUSCULAR | Status: DC | PRN

## 2023-07-03 MED ORDER — ACETAMINOPHEN 650 MG RE SUPP: 650.0000 mg | Freq: Four times a day (QID) | RECTAL | Status: DC | PRN

## 2023-07-03 MED ORDER — LOSARTAN POTASSIUM 50 MG PO TABS
25.0000 mg | ORAL_TABLET | Freq: Once | ORAL | Status: AC
  Administered 2023-07-03: 25 mg via ORAL
  Filled 2023-07-03: qty 1

## 2023-07-03 MED ORDER — SPIRONOLACTONE 25 MG PO TABS
25.0000 mg | ORAL_TABLET | Freq: Every day | ORAL | Status: DC
  Administered 2023-07-03 – 2023-07-07 (×5): 25 mg via ORAL
  Filled 2023-07-03 (×5): qty 1

## 2023-07-03 MED ORDER — CARMEX CLASSIC LIP BALM EX OINT
TOPICAL_OINTMENT | CUTANEOUS | Status: DC | PRN
  Filled 2023-07-03: qty 10

## 2023-07-03 MED ORDER — ACETAMINOPHEN 325 MG PO TABS
650.0000 mg | ORAL_TABLET | Freq: Four times a day (QID) | ORAL | Status: DC | PRN
  Administered 2023-07-03 – 2023-07-06 (×2): 650 mg via ORAL
  Filled 2023-07-03 (×2): qty 2

## 2023-07-03 MED ORDER — EMPAGLIFLOZIN 10 MG PO TABS
10.0000 mg | ORAL_TABLET | Freq: Every day | ORAL | Status: DC
  Administered 2023-07-03 – 2023-07-07 (×5): 10 mg via ORAL
  Filled 2023-07-03 (×5): qty 1

## 2023-07-03 MED ORDER — FUROSEMIDE 20 MG PO TABS
40.0000 mg | ORAL_TABLET | Freq: Once | ORAL | Status: AC
  Administered 2023-07-03: 40 mg via ORAL
  Filled 2023-07-03: qty 2

## 2023-07-03 MED ORDER — FUROSEMIDE 10 MG/ML IJ SOLN
20.0000 mg | Freq: Two times a day (BID) | INTRAMUSCULAR | Status: DC
  Administered 2023-07-03: 20 mg via INTRAVENOUS
  Filled 2023-07-03: qty 2

## 2023-07-03 MED ORDER — NICOTINE 21 MG/24HR TD PT24
21.0000 mg | MEDICATED_PATCH | Freq: Every day | TRANSDERMAL | Status: DC
  Administered 2023-07-03 – 2023-07-07 (×5): 21 mg via TRANSDERMAL
  Filled 2023-07-03 (×5): qty 1

## 2023-07-03 MED ORDER — FUROSEMIDE 10 MG/ML IJ SOLN
60.0000 mg | Freq: Two times a day (BID) | INTRAMUSCULAR | Status: DC
  Administered 2023-07-03 – 2023-07-05 (×4): 60 mg via INTRAVENOUS
  Filled 2023-07-03 (×4): qty 6
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
## 2023-07-03 NOTE — Assessment & Plan Note (Addendum)
 Continue blood pressure control with losartan, spironolactone. Volume status has improved.

## 2023-07-03 NOTE — Progress Notes (Signed)
  Progress Note   Patient: Charles [name]. ZOX:[id number] DOB: Feb 26, 1971 DOA: 07/03/2023     0 DOS: the patient was seen and examined on 07/03/2023   Brief hospital course: Charles [name] was admitted to the hospital with the working diagnosis of heart failure exacerbation.   53 yo male with the past medical history of heart failure, hypertension, CKD stage 3b, and cocaine abuse who presented with dyspnea. Reported 3 days of progressive dyspnea, lower extremity edema and orthopnea.  Recent hospitalization for heart failure 02/07 to 06/24/23, mentioned that a few days after his discharge he decided to stop talking his medications.  On his initial physical examination his blood pressure was 140/113, HR 85, RR 22 and 02 saturation 95%.  Lungs with no wheezing or rhonchi, heart with S1 and S2 present and regular, abdomen with no distention, positive lower extremity edema.   Na 138, K 4,1 Cl 108 bicarbonate 21 glucose 92 bun 26 cr 1,74  BNP 1,597  High sensitive troponin 101, 89, 74  Wbc 3,7 hgb 11,2 plt 103   Toxicology positive for cocaine.   Chest radiograph with cardiomegaly, with bilateral hilar vascular congestion with no effusions or infiltrates.   EKG 83 bpm, left axis deviation, qtc 505, sinus rhythm with left atrial enlargement, no significant ST segment changes, negative T wave lead I and aVL.     Assessment and Plan: * Acute on chronic systolic CHF (congestive heart failure) (HCC) 01/2023 echocardiogram with reduced LV systolic function 25 to 30%, global hypokinesis, moderate LVH. RV systolic function with mild reduction, no atrial dilatation, no significant valvular disease.   Continue volume overloaded.   Plan to continue diuresis with IV furosemide, increase dose to 60 mg bid. Continue losartan, empagliflozin and spironolactone.  Re check limited echocardiogram.   Elevated high sensitive troponin due to heart failure, not acute coronary syndrome.   Essential  hypertension Continue blood pressure control with losartan, spironolactone and aggressive diuresis with furosemide   Acute kidney injury superimposed on chronic kidney disease (HCC) CKD stage 3b.   Renal function with serum cr at 1,84 with K at 4,0 and serum bicarbonate at 22  Na 139 and Mg 1,9   Continue diuresis, follow up renal function and electrolytes in am.   Polysubstance abuse (HCC) Positive acute cocaine intoxication.   Continue supportive medical therapy.  Hold on B blockade.         Subjective: patient continue to have dyspnea, no chest pain.   Physical Exam: Vitals:   07/03/23 1309 07/03/23 1335 07/03/23 1425 07/03/23 1435  BP:    (!) 143/83  Pulse: 83 78 80 92  Resp: (!) 28 20 19  (!) 24  Temp:    98.2 F (36.8 C)  TempSrc:    Oral  SpO2: 98% 99% 99% 100%  Weight:      Height:       Neurology awake and alert, deconditioned and ill looking appearing ENT with no pallor Cardiovascular with S1 and S2 present and regular with no gallops, rubs or murmurs Positive JVD No lower extremity edema Respiratory with bilateral rales with no rhonchi or wheezing Abdomen with no distention  Data Reviewed:    Family Communication: no family at the bedside   Disposition: Status is: Inpatient Remains inpatient appropriate because: IV diuresis   Planned Discharge Destination: Home   Author: Coralie Keens, MD 07/03/2023 4:52 PM  For on call review [email or web address].

## 2023-07-03 NOTE — Hospital Course (Addendum)
 Mr. Schuneman was admitted to the hospital with the working diagnosis of heart failure exacerbation.   53 yo male with the past medical history of heart failure, hypertension, CKD stage 3b, and cocaine abuse who presented with dyspnea. Reported 3 days of progressive dyspnea, lower extremity edema and orthopnea.  Recent hospitalization for heart failure 02/07 to 06/24/23, mentioned that a few days after his discharge he decided to stop talking his medications.  On his initial physical examination his blood pressure was 140/113, HR 85, RR 22 and 02 saturation 95%.  Lungs with no wheezing or rhonchi, heart with S1 and S2 present and regular, abdomen with no distention, positive lower extremity edema.   Na 138, K 4,1 Cl 108 bicarbonate 21 glucose 92 bun 26 cr 1,74  BNP 1,597  High sensitive troponin 101, 89, 74  Wbc 3,7 hgb 11,2 plt 103   Toxicology positive for cocaine.   Chest radiograph with cardiomegaly, with bilateral hilar vascular congestion with no effusions or infiltrates.   EKG 83 bpm, left axis deviation, qtc 505, sinus rhythm with left atrial enlargement, no significant ST segment changes, negative T wave lead I and aVL.   02/23 patient with euvolemic state. He is feeling dizziness and lightheadedness. Plan for possible discharge tomorrow.  02/24 continue with euvolemic state. Plan for follow up with the heart failure clinic next week.

## 2023-07-03 NOTE — ED Triage Notes (Signed)
 Pt arrived from the street via [facility] c/o sob. Hx chf. Pt states that he used cocaine about an hour ago.

## 2023-07-03 NOTE — ED Provider Notes (Addendum)
 [place] EMERGENCY DEPARTMENT AT [facility] Provider Note   CSN: [id number] Arrival date & time: 07/03/23  0120     History  Chief Complaint  Patient presents with   Shortness of Breath    Charles [name]. is a 53 y.o. male.  The history is provided by the patient and medical records.   53 year old male with history of CHF, hepatitis C, chronic kidney disease, COPD, hypertension, presenting to the ED with shortness of breath.  [place]  Apparently has had worsening shortness of breath over the past few days, increased ankle edema.  He has not taken his blood pressure or CHF meds for the past week.  He did use cocaine about 1 hour PTA as well.  Denies chest pain at present.  Home Medications Prior to Admission medications   Medication Sig Start Date End Date Taking? Authorizing Provider  albuterol (VENTOLIN HFA) 108 (90 Base) MCG/ACT inhaler Inhale 2 puffs into the lungs every 4 (four) hours as needed for wheezing or shortness of breath. Patient not taking: Reported on 06/21/2023 06/11/23   Dorcas Carrow, MD  empagliflozin (JARDIANCE) 10 MG TABS tablet Take 1 tablet (10 mg total) by mouth daily. 06/24/23   Almon Hercules, MD  furosemide (LASIX) 40 MG tablet Take 1 tablet (40 mg total) by mouth daily. 06/24/23   Almon Hercules, MD  gabapentin (NEURONTIN) 100 MG capsule Take 1 capsule (100 mg total) by mouth 2 (two) times daily. Patient not taking: Reported on 06/21/2023 06/11/23 09/09/23  Dorcas Carrow, MD  losartan (COZAAR) 25 MG tablet Take 1 tablet (25 mg total) by mouth daily. 06/24/23   Almon Hercules, MD  spironolactone (ALDACTONE) 25 MG tablet Take 1 tablet (25 mg total) by mouth daily. 06/24/23 07/24/23  Almon Hercules, MD      Allergies    Banana, Egg-derived products, and Pork-derived products    Review of Systems   Review of Systems  Respiratory:  Positive for shortness of breath.   All other systems reviewed and are  negative.   Physical Exam Updated Vital Signs BP (!) 140/113   Pulse 85   Temp (!) 97.2 F (36.2 C) (Oral)   Resp (!) 22   Ht 5\' 6"  (1.676 m)   Wt 72.2 kg   SpO2 95%   BMI 25.69 kg/m   Physical Exam Vitals and nursing note reviewed.  Constitutional:      Appearance: He is well-developed.  HENT:     Head: Normocephalic and atraumatic.  Eyes:     Conjunctiva/sclera: Conjunctivae normal.     Pupils: Pupils are equal, round, and reactive to light.  Cardiovascular:     Rate and Rhythm: Normal rate and regular rhythm.     Heart sounds: Normal heart sounds.  Pulmonary:     Effort: Pulmonary effort is normal.     Breath sounds: Normal breath sounds.     Comments: Some increased WOB without noted wheezes or rales on exam, seems worse with movement Abdominal:     General: Bowel sounds are normal.     Palpations: Abdomen is soft.  Musculoskeletal:        General: Normal range of motion.     Cervical back: Normal range of motion.     Comments: 2+ pitting edema BLE, grossly symmetric  Skin:    General: Skin is warm and dry.  Neurological:     Mental Status: He is alert and oriented  to person, place, and time.     ED Results / Procedures / Treatments   Labs (all labs ordered are listed, but only abnormal results are displayed) Labs Reviewed  CBC WITH DIFFERENTIAL/PLATELET - Abnormal; Notable for the following components:      Result Value   WBC 3.7 (*)    RBC 3.23 (*)    Hemoglobin 11.2 (*)    HCT 32.8 (*)    MCV 101.5 (*)    MCH 34.7 (*)    RDW 18.3 (*)    Platelets 103 (*)    Neutro Abs 1.6 (*)    All other components within normal limits  COMPREHENSIVE METABOLIC PANEL - Abnormal; Notable for the following components:   CO2 21 (*)    BUN 26 (*)    Creatinine, Ser 1.74 (*)    Albumin 3.4 (*)    GFR, Estimated 47 (*)    All other components within normal limits  BRAIN NATRIURETIC PEPTIDE - Abnormal; Notable for the following components:   B Natriuretic Peptide  1,597.4 (*)    All other components within normal limits  TROPONIN I (HIGH SENSITIVITY) - Abnormal; Notable for the following components:   Troponin I (High Sensitivity) 101 (*)    All other components within normal limits  ETHANOL  RAPID URINE DRUG SCREEN, HOSP PERFORMED  CBC WITH DIFFERENTIAL/PLATELET  COMPREHENSIVE METABOLIC PANEL  MAGNESIUM  TROPONIN I (HIGH SENSITIVITY)    EKG None  Radiology DG Chest Port 1 View Result Date: 07/03/2023 CLINICAL DATA:  Shortness of breath, CHF exacerbation earlier this month. EXAM: PORTABLE CHEST 1 VIEW COMPARISON:  Portable chest 06/20/2023 FINDINGS: The heart again moderately enlarged. There central vascular distension but the interstitial edema noted previously is not seen today. The lungs are clear of infiltrates. The mediastinum is normally outlined. There is bridging enthesopathy and slight dextroscoliosis of the thoracic spine. No new osseous findings. IMPRESSION: Cardiomegaly with central vascular distension but overt edema. No other evidence of acute chest process. Electronically Signed   By: Almira Bar M.D.   On: 07/03/2023 01:44    Procedures Procedures   CRITICAL CARE Performed by: Garlon Hatchet   Total critical care time: 35 minutes  Critical care time was exclusive of separately billable procedures and treating other patients.  Critical care was necessary to treat or prevent imminent or life-threatening deterioration.  Critical care was time spent personally by me on the following activities: development of treatment plan with patient and/or surrogate as well as nursing, discussions with consultants, evaluation of patient's response to treatment, examination of patient, obtaining history from patient or surrogate, ordering and performing treatments and interventions, ordering and review of laboratory studies, ordering and review of radiographic studies, pulse oximetry and re-evaluation of patient's condition.  Medications  Ordered in ED Medications  acetaminophen (TYLENOL) tablet 650 mg (has no administration in time range)    Or  acetaminophen (TYLENOL) suppository 650 mg (has no administration in time range)  melatonin tablet 3 mg (has no administration in time range)  furosemide (LASIX) injection 20 mg (has no administration in time range)  furosemide (LASIX) tablet 40 mg (40 mg Oral Given 07/03/23 0329)  losartan (COZAAR) tablet 25 mg (25 mg Oral Given 07/03/23 0329)    ED Course/ Medical Decision Making/ A&P                                 Medical Decision Making Amount  and/or Complexity of Data Reviewed Labs: ordered. Radiology: ordered and independent interpretation performed. ECG/medicine tests: ordered and independent interpretation performed.  Risk Prescription drug management. Decision regarding hospitalization.   53 y.o. M here with SOB.  Hx of CHF, no meds in the past week.  He is not hypoxic here.  Does have some LE edema, grossly symmetric.  Does admit to using cocaine about 1 our prior to EMS arrival.  Will check EKG, labs, CXR.  UDS ordered.  3:34 AM Patient remains hypertensive here.  Trop 101, higher than usual.  BNP 1597.  Did endorse using cocaine just PTA.  Denies active chest pain. Labs without leukocytosis.  Renal function appears close to baseline. He has been non-compliant with home medications-- states he has them but does not like taking them.  Give dose of lasix and home cozaar.  Will admit for ongoing care.  Discussed with Dr. Arlean Hopping-- will admit for ongoing care.  Final Clinical Impression(s) / ED Diagnoses Final diagnoses:  Acute on chronic congestive heart failure, unspecified heart failure type [facility])    Rx / DC Orders ED Discharge Orders     None         Garlon Hatchet, PA-C 07/03/23 0422    Garlon Hatchet, PA-C 07/03/23 0423    Tilden Fossa, MD 07/03/23 [phone]

## 2023-07-03 NOTE — Assessment & Plan Note (Addendum)
 CKD stage 3b. Hyponatremia   Renal function today with serum cr at 1,59 with K at 4,0 and serum bicarbonate at 22  Na 134 and Mg 2.3   Plan to resume loop diuretic in am.  Follow up renal function and electrolytes in am.

## 2023-07-03 NOTE — Assessment & Plan Note (Addendum)
 Limited echocardiogram with reduced LV systolic function with EF 30 to 35%, moderate LVH, RV systolic function with moderate reduction, moderate RV wall thickness, no significant valvular disease.   Urine output 2,900  ml  Systolic blood pressure 120 mmHg range  Continue losartan, empagliflozin and spironolactone.  Transition to oral loop diuretic in am.   Elevated high sensitive troponin due to heart failure, not acute coronary syndrome.

## 2023-07-03 NOTE — Progress Notes (Signed)
 CSW added substance abuse resources to patient's AVS.  Edwin Dada, MSW, LCSW Transitions of Care  Clinical Social Worker II [phone]

## 2023-07-03 NOTE — TOC CM/SW Note (Signed)
 Transition of Care [facility]) - Inpatient Brief Assessment   Patient Details  Name: Charles [name]. MRN: [id number] Date of Birth: [birth year]  Transition of Care [facility]) CM/SW Contact:    Leone Haven, RN Phone Number: 07/03/2023, 4:45 PM   Clinical Narrative: Homeless, has no  PCP , he was to see Sheppard Coil tomorrow at 1:20, but he is here so will need to reschedule , it is rescheduled fore 3/11 at 9:40 and insurance on file, states has no HH services in place at this time or DME at home.  He will need a bus pass at dc and has no support system, [facility] pharmacy will need to fill medications.  Pta self ambulatory .   Transition of Care Asessment: Insurance and Status: Insurance coverage has been reviewed Patient has primary care physician: No (will see Sheppard Coil at [facility]) Home environment has been reviewed: homeless Prior level of function:: indep Prior/Current Home Services: No current home services Social Drivers of Health Review: SDOH reviewed interventions complete Readmission risk has been reviewed: Yes Transition of care needs: transition of care needs identified, TOC will continue to follow

## 2023-07-03 NOTE — Assessment & Plan Note (Addendum)
 Positive acute cocaine intoxication.  He has been advices about cessation of substance use.  Avoid use of  B blockade.

## 2023-07-04 ENCOUNTER — Inpatient Hospital Stay (HOSPITAL_COMMUNITY): Payer: Medicare HMO

## 2023-07-04 ENCOUNTER — Ambulatory Visit: Payer: Medicare HMO | Admitting: Family

## 2023-07-04 DIAGNOSIS — I10 Essential (primary) hypertension: Secondary | ICD-10-CM | POA: Diagnosis not present

## 2023-07-04 DIAGNOSIS — F1910 Other psychoactive substance abuse, uncomplicated: Secondary | ICD-10-CM | POA: Diagnosis not present

## 2023-07-04 DIAGNOSIS — R0609 Other forms of dyspnea: Secondary | ICD-10-CM | POA: Diagnosis not present

## 2023-07-04 DIAGNOSIS — Z006 Encounter for examination for normal comparison and control in clinical research program: Secondary | ICD-10-CM

## 2023-07-04 DIAGNOSIS — N179 Acute kidney failure, unspecified: Secondary | ICD-10-CM | POA: Diagnosis not present

## 2023-07-04 DIAGNOSIS — D638 Anemia in other chronic diseases classified elsewhere: Secondary | ICD-10-CM

## 2023-07-04 DIAGNOSIS — I5023 Acute on chronic systolic (congestive) heart failure: Secondary | ICD-10-CM | POA: Diagnosis not present

## 2023-07-04 LAB — ECHOCARDIOGRAM LIMITED
AR max vel: 1.49 cm2
AV Area VTI: 1.5 cm2
AV Area mean vel: 1.4 cm2
AV Mean grad: 9 mm[Hg]
AV Peak grad: 17.8 mm[Hg]
Ao pk vel: 2.11 m/s
Area-P 1/2: 3.91 cm2
Calc EF: 39.5 %
Height: 66 in
S' Lateral: 4.1 cm
Single Plane A2C EF: 37.7 %
Single Plane A4C EF: 38.5 %
Weight: 2455.04 [oz_av]

## 2023-07-04 LAB — MAGNESIUM: Magnesium: 2 mg/dL (ref 1.7–2.4)

## 2023-07-04 LAB — BASIC METABOLIC PANEL WITH GFR
Anion gap: 10 (ref 5–15)
BUN: 33 mg/dL — ABNORMAL HIGH (ref 6–20)
CO2: 25 mmol/L (ref 22–32)
Calcium: 9.2 mg/dL (ref 8.9–10.3)
Chloride: 103 mmol/L (ref 98–111)
Creatinine, Ser: 1.58 mg/dL — ABNORMAL HIGH (ref 0.61–1.24)
GFR, Estimated: 52 mL/min — ABNORMAL LOW
Glucose, Bld: 142 mg/dL — ABNORMAL HIGH (ref 70–99)
Potassium: 4 mmol/L (ref 3.5–5.1)
Sodium: 138 mmol/L (ref 135–145)

## 2023-07-04 MED ORDER — POTASSIUM CHLORIDE CRYS ER 20 MEQ PO TBCR
40.0000 meq | EXTENDED_RELEASE_TABLET | Freq: Once | ORAL | Status: AC
  Administered 2023-07-04: 40 meq via ORAL
  Filled 2023-07-04: qty 2

## 2023-07-04 MED ORDER — GABAPENTIN 100 MG PO CAPS
100.0000 mg | ORAL_CAPSULE | Freq: Two times a day (BID) | ORAL | Status: DC
  Administered 2023-07-04 – 2023-07-07 (×7): 100 mg via ORAL
  Filled 2023-07-04 (×7): qty 1

## 2023-07-04 NOTE — Discharge Instructions (Signed)
 [employer] assistance programs Crisis assistance programs  -[facility]eless[facility] Arts development officer. If you are experiencing homelessness in [place], [place], your first point of contact should be Pensions consultant. You can reach Coordinated Entry by calling (336) [phone] or by emailing [email or web address] [email or web address].  Community access points: [employer] [phone] [address], [place]) every Tuesday from 9am-10am. [facility] ([address] [place]) every Wednesday from 8am-9am.   -[place] Coordinated Re-entry Marcy Panning: Dial 211 and request. Offers referrals to homeless shelters in the area.    -The [employer] 938-563-0408) offers several services to local families, as funding allows. The Emergency Assistance Program (EAP), which they administer, provides household goods, free food, clothing, and financial aid to people in need in the [facility] area. The EAP program does have some qualification, and counselors will interview clients for financial assistance by written referral only. Referrals need to be made by the Department of Social Services or by other EAP approved human services agencies or charities in the area.  -Open Door Ministries of [employer], which can be reached at 815-501-6103, offers emergency assistance programs for those in need of help, such as food, rent assistance, a soup kitchen, shelter, and clothing. They are based in [facility] but provide a number of services to those that qualify for assistance.   [facility] Department of Social Services may be able to offer temporary financial assistance and cash grants for paying rent and utilities, Help may be provided for local county residents who may be experiencing personal crisis when other resources, including government programs, are not available. Call [phone]  -High [employer] is a [employer] agency, The organization can offer emergency assistance for paying rent, Caremark Rx, utilities, food, household products and furniture. They offer extensive emergency and transitional housing for families, children and single women, and also run a Boy's and D[employer] Food. Thrift Shops, Secondary school teacher, and other aid offered too. [address], [place], [place] [address], [phone]  -[place] Low Income Energy Assistance Program -- This is offered for [facility] families. The federal government created [employer] Program provides a one-time cash grant payment to help eligible low-income families pay their electric and heating bills. [address] [place], [place] [address], 9052569861  -[place] Emergency Assistance -- A program offers emergency utility and rent funds for greater [employer] area residents. The program can also provide counseling and referrals to charities and government programs. Also provides food and a free meal program that serves lunch Mondays - Saturdays and dinner seven days per week to individuals in the community. [address], [place], [place] [address], [phone]  -[employer] - Offers affordable apartment and housing communities across      [place] and [place]. The low income and seniors can access public housing, rental assistance to qualified applicants, and apply for the section 8 rent subsidy program. Other programs include Chiropractor and Engineer, maintenance. [address] [place], [place] [address], dial [phone].  -The [facility] provides transitional housing to veterans and the disabled. Clients will also access other services too, including assistance in applying for Disability, life skills classes, case management, and assistance in finding permanent housing. [address], [place], [place]  [place] [address], call [phone]  -[place] Transitional Housing through [facility] is for people who were just  evicted or that are formerly homeless. The non-profit will also help then gain self-sufficiency, find a home or apartment to live in, and also provides information on rent assistance when needed. Phone [phone]  -The [place] [facility] [facility] helps low income, elderly, or disabled residents in seven counties in the [place] ([place], [place], [place], [place], [place], [place], [place], and [place]) save energy and reduce their utility bills by improving energy efficiency. Phone [phone].  -[employer] is located in the [place] [facility] in the [employer], [address], [place], [place] [address]. Parking is in the rear of the building. Phone: [phone]   General Email: info@gsohc .org  GHC provides free housing counseling assistance in locating affordable rental housing or housing with support services for families and individuals in crisis and the chronically homeless. We provide potential resources for other housing needs like utilities. Our trained counselors also work with clients on budgeting and financial literacy in effort to empower them to take control of their financial situations. [employer] collaborates with homeless service providers and other stakeholders as part of the [employer] COC (Continuum of Care). The (COC) is a regional/local planning body that coordinates housing and services funding for homeless families and individuals. The role of [facility] in the COC is through housing counseling to work with people we serve on diversion strategies for those that are at imminent risk of becoming homeless. We also work with the Coordinated Assessment/Entry Specialist who attempts to find temporary solutions and/or connects the people  to Housing First, Rapid Re-housing or transitional housing programs. Our Homelessness Prevention Housing Counselors meet with clients on business days (Monday-Fridays, except scheduled holidays) from 8:30 am to 4:30 pm.  Legal assistance for evictions, foreclosure, and more -If you need free legal advice on civil issues, such as foreclosures, evictions, Electronics engineer, government programs, domestic issues and more, Armed forces operational officer Aid of [facility]) is a Associate Professor firm that provides free legal services and counsel to lower income people, seniors, disabled, and others, The goal is to ensure everyone has access to justice and fair representation. Call them at [phone].  [facility] for Housing and Community Studies can provide info about obtaining legal assistance with evictions. Phone [phone].  Data processing manager  The [employer], [employer]. offers job and Dispensing optician. Resources are focused on helping students obtain the skills and experiences that are necessary to compete in today's challenging and tight job market. The non-profit faith-based community action agency offers internship trainings as well as classroom instruction. Classes are tailored to meet the needs of people in the [facility] region. [place], [place] [address], [phone]  Foreclosure prevention/Debt Services Family Services of the [employer] Credit Counseling Service inludes debt and foreclosure prevention programs for local families. This includes money management, financial advice, budget review and development of a written action plan with a Pensions consultant to help solve specific individual financial problems. In addition, housing and mortgage counselors can also provide pre- and post-purchase homeownership counseling, default resolution counseling (to prevent foreclosure) and reverse mortgage counseling. A Debt Management Program allows  people and families with a high level of credit card or medical debt to consolidate and repay consumer debt and loans to creditors and rebuild positive credit ratings and scores. Contact (336) [phone].  Community clinics in [place] -Health Department [facility]: [address] [place], [place], [address]. [phone].  -Health Department [place] [facility]: [phone]  [address], [facility], [address]. [phone].  -[facility] offers medical care through a group of doctors, pharmacies and other healthcare related agencies that offer services for low income, uninsured adults in [place]. Also offers adult Dental care and assistance with applying for an [employer]. Call [phone].   [facility]. This center provides low-cost health care to those without health insurance. Services offered include an onsite pharmacy. Phone [phone]. [address], [place].  -Medication Assistance Program serves as a link between pharmaceutical companies and patients to provide low cost or free prescription medications. This service is available for residents who meet certain income restrictions and have no insurance coverage. PLEASE CALL [phone] Ginette Otto) OR [phone] ([place])  -One Step Further: Materials engineer, The [employer] Support & Nutrition Program, [employer]. Call [phone]/ [phone].  Food pantry and assistance -Urban Ministry-Food Bank: [address], [place] [address]. Phone [phone]  -Blessed Table Food Pantry: [address] [place], [place] [address]. [phone].  -Missionary Ministry: has the purpose of visiting the sick and shut-ins and provide for needs in the surrounding communities. Call [phone]. Email: [email or web address] This program provides: Food box for seniors, Financial assistance, Food to meet basic  nutritional needs.  -Meals on Wheels with Senior Resources: [facility] residents age 78 and over who are homebound and unable to obtain and prepare a nutritious meal for themselves are eligible for this service. There may be a waiting list in certain parts of [facility] if the route in that area is full. If you are in [facility] and [place] call [phone] to register. For all other areas call [phone] to register.  -Greater Dietitian: https://findfood.BargainContractor.si  TRANSPORTATION: -[employer] Department of Health: Call [facility] and [employer] at [phone] for details. AttractionGuides.es  -Access [place]: Access [place] is the [employer] Agency's shared-ride transportation service for eligible riders who have a disability that prevents them from riding the fixed route bus. Call [phone]. Access [place] riders must pay a fare of $1.50 per trip, or may purchase a 10-ride punch card for $14.00 ($1.40 per ride) or a 40-ride punch card for $48.00 ($1.20 per ride).  -The Shepherd's WHEELS rideshare transportation service is provided for senior citizens (60+) who live independently within [place] city limits and are unable to drive or have limited access to transportation. Call [phone] to schedule an appointment.  -[place] Transportation: For Medicare or Medicaid recipients call [phone]?[facility], wheelchair Zenaida Niece, and ambulatory quotes available.   FLEEING VIOLENCE: -Family Services of the [place]- 24/7 Crisis line [phone]) -[facility] Justice Centers: (336) [phone]-[phone] [phone])  [place] 2-1-1 is another useful way to locate resources in the community. Visit ShedSizes.ch to find service information online. If you need additional assistance, 2-1-1 Referral Specialists are available 24 hours a day, every day by dialing  2-1-1 or [phone] from any phone. The call is free, confidential, and available in any language.  Affordable Housing Search http://www.nchousingsearch.org  [facility])   M-F 8a-3p [address] [place], [place] [address] [phone] Services include: laundry, barbering, support groups, case management, phone & computer access, showers, AA/NA mtgs, mental health/substance abuse nurse, job skills class, disability information, VA assistance, spiritual classes, etc. Winter Shelter available when temperatures are less than 32 degrees.   HOMELESS SHELTERS [facility] at [facility]- Call [phone] ext. [phone]  or ext. 336. Located at [address] [place], [place] [address]  [facility] Mens Shelter- Call [phone]. Located at [address], [place] [address].  [facility]- [employer]. Call [phone]. Office located at [address] [place] [address].  [facility]ways Family Housing through [place] [phone].  [facility] Family Shelter- Call [phone]. Located at [address] [place], [place], [place] [address].  Room at the Inn-For Pregnant mothers. Call [phone]. Located at [address] [place], [address].  [place] [facility]-For men in [place]. Call [phone]. [address]-Shelter in [place]. Call [phone].  Home of [employer] for [employer] [phone]. Office located at [address], [place], [address].  [employer] be agreeable to help with chores. Call [phone] ext. [phone].  Men's: [address] [place], [place] [address]. Women's: [facility]  [address] [place], [place] [address]  Crisis Services [facility] Mobile Crisis Management- [phone]  [facility] [address], [place], [place] [address]. Phone: [phone] In a time of Crisis:   -  [facility]rapeutic Alternatives, inc.   Mobile Crisis Management provides immediate crisis response, 24/7.   Call [phone]   [facility] for MH/DD/SA Services?Access Center is available 24 hours a day, 7 days a week. Customer Service Specialists will assist you to find a crisis provider that is well-matched with your needs. Your local number is:?[phone]   - [facility]/Behavioral Health Urgent Care [facility])  OP, individual counseling, medication management  [address] [place], [place] [address]  ; [phone]  Call for intake hours; Medicaid and Uninsured     Outpatient Providers    [facility]cohol and Drug Services (ADS)  Group and individual counseling.  [address]   [place], [place] [address]  [phone]  [place]: [phone]  [place]: [phone]  Medicaid and uninsured.     [facility]  Offers IOP groups multiple times per week.  [address] Sherian Maroon [place], [place] [address]  [phone]  Takes Medicaid and other insurances.     [facility] Outpatient   Chemical Dependency Intensive Outpatient Program (IOP)  [address]  [place], [place] [address]  [phone]  Takes Nurse, learning disability and [place].     [facility]   IOP and Partial Hospitalization Program   [address]   [place], [place] [address]  [phone]  Private Insurance, [place] only for partial hospitalization    [facility] [place]  [address], [place], [place] [address]  [phone]  Monday-Friday. Short and Long term options.  [facility]/Behavioral Health Urgent Care (BHUC)  IOP, individual counseling, medication management  [address] [place], [place] [address]  [phone]  Medicaid and [facility]    [facility]  [address]   [place], [place] [address]  [phone]  Private Insurance and Self Pay     [facility]  Outpatient  [address]   [place], [place] [address]  [phone]  Private Insurance, [place], and Self Pay     [facility]: Methadone Clinic   [address] [place], [place] [address]  [facility]   [address]   [place], [place] [address]  [phone]    [facility]aring [facility]   [address]  [place], [place] [address]  [phone]          Residential Treatment Programs    [facility] (Addiction Recovery [facility]  [address]  [place], [place] [address]  [phone] or [phone]  Detox and Residential Rehab 21 days  (Medicaid, private insurance, and self pay. If Medicare, will look into funding). No methadone. Call for pre-screen.     [facility])  [address]   [place], [place] [address]  [phone]  Detox 3-7 days (self Pay and Medicaid Limited availability). Transitional Program for females needs 60 days clean first.   Rehab Only for Males (Medicare, Medicaid, and Self Pay)-No methadone.    [facility] [address]  [address]  [place], [place] [address]  [phone] or [phone]  Private Insurance only    [facility]  PHONE:?[phone]  FAX:?[phone]  Residential program for women 21 and over for up to a year through a Christian 12-step recovery model.  Self-pay.      [facility]  [address] [place], [place] [address]  Phone: ?[phone]  Must be detoxed 72 hours prior to admission; 28 day program.   Self-pay.    [facility]  [address]   [place], [place]  [phone]  [employer], Medicare, [place] (not straight [place]). They offer assistance with transportation.     [facility]  [address] [place],   [place], [place] [address]  [phone]  Christian Based Program. Men only.  No insurance    [facility] is a substance use disorder treatment program for women, including those who are  pregnant, parenting, and/or whose lives have been touched by abuse and violence.  [phone]) [phone]    [facility]  [address] [place], [place] [address]  Women's: [phone]  Men's: [phone]  No Medicaid.     Addiction Centers of [place]  Locations across the [place] (mainly [place]) willing to help with transportation.   [phone] [employer].  [facility] Facility   [address] [place]   [place], [place] [address]  [phone]  Treatment Only, must make assessment appointment, and must be sober for assessment appointment. Self pay, [facility], must be [facility] resident. No methadone.     [facility]   [address]  [place], [place] [address]  [phone]  No pending legal charges, Long-term work program. No methadone. Call for assessment.    [facility]   [address],  [place], [place] [address]  [phone] or [phone]  Commercial Insurance Only    [facility]  Local - [phone]  [phone]  Private Insurance (no [place]).  Males/Females, call to make referrals, multiple facilities.     [facility]  [address]   [place], [place] [address]  ?([place]  Men Only Upfront Fee  [facility]  Women's Program: [facility]  [address]  [place], [place] [address]  [phone]    [facility]s Healing Transitions-no methadone:  [facility]  [address]  [place], [place] [address]  [phone] (f)    [facility]  [address] [place], [place] [address]  [phone] [facility]  [phone]  [place], [place]  For women, houses 8 residents for sober living. No Medicaid.  AA Meetings   Meeting Locator:  [email or web address]  Also can download an app on that website.    Syringe Services Program    Due to COVID-19, syringe services programs are likely operating under different hours with limited or no fixed site hours. Some programs may not be operating at all. Please contact the program directly using the phone numbers provided below to see if they are still operating under COVID-19.   [facility] Solution to the Opioid Problem (GCSTOP)  Fixed; mobile; peer-based  Roxy Cedar  [phone]  [email or web address]   Fixed site exchange at [facility], [address]  [place], [place] [address] on Wednesdays (2:00 - 5:00 pm) and Thursdays (4:00 - 8:00 pm).  Pop-up mobile exchange locations:  [employer] and [employer], [address] [place], [place] [address] on Tuesdays (11:00 am - 1:00 pm) and Fridays (11:00 am - 1:00 pm)   -[facility]?-?[address], [place], [place] [address] on Tuesdays (2:00 - 4:00 pm) and Fridays (2:00 - 4:00 pm)   -[facility]?- also serves [place] and [employer]  Seymour [employer]  Fixed; mobile; peer-based  Lendon Ka  [phone]  [email or web address]  [address] [place], [place] [address]  Delivery and outreach available in [place] and [place], please call for more information. Monday, Tuesday: 1:00 -7:00 pm  Thursday: 4:00 pm - 8:00 pm  Friday: 1:00 pm - 8:00 pm)   Medication-Assisted Treatment (MAT)   -[place]- services [address] and surrounding areas including [place], [place], [place], [place], [address], [place], [place], [place], [place], and [place], [place].   Options include Methadone, buprenorphine or Suboxone.   [address], [place] [address]  [place], [place] [address]  Phone: [phone]   Mon - Fri: 5:30am - 2:00pm  Sat: 5:30am -7:30am  Sun: Closed  Holidays: 6:00am - 8:00am    -[facility] [place]- We use FDA-approved medications, like methadone/suboxone/sublocade, and vivitrol. These medications are then combined with  customized care plans that include individual or group counseling, toxicology, and medical care directed by on-site physicians. Accepts most insurance plans, Medicaid, and private pay.    [address]  [place], [place] [address]  Phone:?[phone]   Monday-Friday 5:00 AM - 10:00 AM  Saturday  6:00 AM - 8:30 AM  Sunday 6:00 AM - 7:00 AM    -Alcohol & Drug Services- ADS is a treatment & recovery focused program. In addition to receiving methadone medication, our clients participate in individual and group counseling as well as random drug testing. If accepted into the [facility]S Opioid Program, you will be provided several intake appointments and a physical exam   [address]  [place], [place] [address]  Office: [phone]  Fax: [phone]     -[facility]- We put our community members at the center of everything we do, for remote treatment services as well as in-person, from alcohol withdrawal to opioid use and more.?   [address], [place], [place] [address]  [phone]   Monday-Wednesday:?9:00am - 5:00pm  Thursday:?9:00am - 6:00pm  Friday:?9:00am - 5:00pm  Saturday:?9:00am - 1:00pm  Sunday:?Closed      -[facility] [place]- Our clinic in [place], a medication unit, offers daily dosing of methadone or buprenorphine in addition to our clinic in [place] offering counseling to help people overcome addiction to heroin and other opiates. We also offer psychiatric services including medication management, and an office based suboxone  program.   [address], [place], [place] [address]  Phone [phone]    [facility] Internal Medicine-We treat Opioid Addiction using medications that are a combination of buprenorphine and naloxone, which are used to treat adults addicted to narcotic painkillers and drugs such as heroin. They reduce the intense cravings and painful symptoms that accompany withdrawal.   [address], [place], [place]  Phone: [phone]    [facility])  [address], [place], [place] [address]  [phone]   [place]  [phone]  [address] [place], [place] [address]   M-W??? 5:00am-12:00pm  Thu???? 5:00am-10:00am  Fri?????? 5:00am-12:00pm  Sat????? 5:00am-8:00am  Sun???? Closed   $12/daily for Methadone Treatment.

## 2023-07-04 NOTE — Assessment & Plan Note (Addendum)
 Follow up hgb is 11,1  Positive leukopenia at 3,7 and thrombocytopenia 105  B 12 level is 364  Iron 80, TIBC 405, transferrin saturation 20 and ferritin 69, transferrin 280.

## 2023-07-04 NOTE — Progress Notes (Addendum)
 TOC familiar with pt. CSW reviewed SDOH needs and added resources for food, housing, transportation and more to pts AVS.  Johnnette Gourd, MSW, LCSWA Transitions of Care [phone]

## 2023-07-04 NOTE — Progress Notes (Addendum)
 Progress Note   Patient: Charles [name]. ZOX:[id number] DOB: [birth year] DOA: 07/03/2023     1 DOS: the patient was seen and examined on 07/04/2023   Brief hospital course: Charles [name] was admitted to the hospital with the working diagnosis of heart failure exacerbation.   53 yo male with the past medical history of heart failure, hypertension, CKD stage 3b, and cocaine abuse who presented with dyspnea. Reported 3 days of progressive dyspnea, lower extremity edema and orthopnea.  Recent hospitalization for heart failure 02/07 to 06/24/23, mentioned that a few days after his discharge he decided to stop talking his medications.  On his initial physical examination his blood pressure was 140/113, HR 85, RR 22 and 02 saturation 95%.  Lungs with no wheezing or rhonchi, heart with S1 and S2 present and regular, abdomen with no distention, positive lower extremity edema.   Na 138, K 4,1 Cl 108 bicarbonate 21 glucose 92 bun 26 cr 1,74  BNP 1,597  High sensitive troponin 101, 89, 74  Wbc 3,7 hgb 11,2 plt 103   Toxicology positive for cocaine.   Chest radiograph with cardiomegaly, with bilateral hilar vascular congestion with no effusions or infiltrates.   EKG 83 bpm, left axis deviation, qtc 505, sinus rhythm with left atrial enlargement, no significant ST segment changes, negative T wave lead I and aVL.     Assessment and Plan: * Acute on chronic systolic CHF (congestive heart failure) (HCC) Limited echocardiogram with reduced LV systolic function with EF 30 to 35%, moderate LVH, RV systolic function with moderate reduction, moderate RV wall thickness, no significant valvular disease.   Urine output 5,400 ml  Systolic blood pressure 128 mmHg.   Continue diuresis with IV furosemide with 60 mg bid. Continue losartan, empagliflozin and spironolactone.   Elevated high sensitive troponin due to heart failure, not acute coronary syndrome.   Essential hypertension Continue blood pressure  control with losartan, spironolactone and aggressive diuresis with furosemide   Acute kidney injury superimposed on chronic kidney disease (HCC) CKD stage 3b.   Renal function with serum cr at 1,84 with K at 4,0 and serum bicarbonate at 22  Na 139 and Mg 1,9   Add 40 meq Kcl to prevent hypokalemia.  Continue diuresis, follow up renal function and electrolytes in am.   Polysubstance abuse (HCC) Positive acute cocaine intoxication.   Continue supportive medical therapy.  Hold on B blockade.   Anemia of chronic disease Follow up hgb is 11,1  Positive leukopenia at 3,7 and thrombocytopenia 105  B 12 level is 364  Check iron panel.        Subjective: patient with persistent dyspnea, no PND or orthopnea, he is having back pain   Physical Exam: Vitals:   07/04/23 0227 07/04/23 0228 07/04/23 0453 07/04/23 1239  BP:   128/73 111/61  Pulse:    97  Resp: (!) 22 (!) 22 20 18   Temp:   98.4 F (36.9 C) 98.5 F (36.9 C)  TempSrc:   Oral Oral  SpO2:   100% 100%  Weight:   69.6 kg   Height:       Neurology awake and alert ENT With mild pallor Cardiovascular with S1 and S2 present and regular with no gallops, rubs or murmurs Moderate JVD No lower extremity edema Respiratory with mild rales with no wheezing or rhonchi Abdomen with no distention  Data Reviewed:    Family Communication: no family at the bedside   Disposition: Status is: Inpatient Remains inpatient  appropriate because: IV diuresis   Planned Discharge Destination: Home     Author: Coralie Keens, MD 07/04/2023 2:47 PM  For on call review [email or web address].

## 2023-07-04 NOTE — Research (Addendum)
 SITE: 050     Subject # [id number]    Subprotocol: A  Inclusion Criteria  Patients who meet all of the following criteria are eligible for enrollment as study participants:  Yes No  Age > 53 years old X   Eligible to wear Holter Study X    Exclusion Criteria  Patients who meet any of these criteria are not eligible for enrollment as study participants: Yes No  1. Receiving any mechanical (respiratory or circulatory) or renal support therapy at Screening or during Visit #1.  X  2.  Any other conditions that in the opinion of the investigators are likely to prevent compliance with the study protocol or pose a safety concern if the subject participates in the study.  X  3. Poor tolerance, namely susceptible to severe skin allergies from ECG adhesive patch application.  X   Protocol: REV H                                     Residential Zip code [address] (First 3 digits ONLY)                                             [facility] Informed Consent   Subject Name: Charles [name].  Subject met inclusion and exclusion criteria.  The informed consent form, study requirements and expectations were reviewed with the subject. Subject had opportunity to read consent and questions and concerns were addressed prior to the signing of the consent form.  The subject verbalized understanding of the trial requirements.  The subject agreed to participate in the [facility] EF ACT trial and signed the informed consent at 09:50 on 04-Jul-2023.  The informed consent was obtained prior to performance of any protocol-specific procedures for the subject.  A copy of the signed informed consent was given to the subject and a copy was placed in the subject's medical record.   Charles [name]         No current facility-administered medications for this visit. No current outpatient medications on file.  Facility-Administered Medications Ordered in Other Visits:    acetaminophen (TYLENOL) tablet 650 mg, 650 mg, Oral,  Q6H PRN, 650 mg at 07/03/23 0907 **OR** acetaminophen (TYLENOL) suppository 650 mg, 650 mg, Rectal, Q6H PRN, Howerter, Justin B, DO   empagliflozin (JARDIANCE) tablet 10 mg, 10 mg, Oral, Daily, Howerter, Justin B, DO, 10 mg at 07/04/23 9562   furosemide (LASIX) injection 60 mg, 60 mg, Intravenous, BID, Arrien, York Ram, MD, 60 mg at 07/04/23 1308   lip balm (CARMEX) ointment, , Topical, PRN, Arrien, York Ram, MD   losartan (COZAAR) tablet 25 mg, 25 mg, Oral, Daily, Howerter, Justin B, DO, 25 mg at 07/04/23 6578   melatonin tablet 3 mg, 3 mg, Oral, QHS PRN, Howerter, Justin B, DO   naloxone (NARCAN) injection 0.4 mg, 0.4 mg, Intravenous, PRN, Howerter, Justin B, DO   nicotine (NICODERM CQ - dosed in mg/24 hours) patch 21 mg, 21 mg, Transdermal, Daily, Arrien, York Ram, MD, 21 mg at 07/04/23 4696   spironolactone (ALDACTONE) tablet 25 mg, 25 mg, Oral, Daily, Howerter, Justin B, DO, 25 mg at 07/04/23 [id number]

## 2023-07-05 DIAGNOSIS — N179 Acute kidney failure, unspecified: Secondary | ICD-10-CM | POA: Diagnosis not present

## 2023-07-05 DIAGNOSIS — I10 Essential (primary) hypertension: Secondary | ICD-10-CM | POA: Diagnosis not present

## 2023-07-05 DIAGNOSIS — F1910 Other psychoactive substance abuse, uncomplicated: Secondary | ICD-10-CM | POA: Diagnosis not present

## 2023-07-05 DIAGNOSIS — I5023 Acute on chronic systolic (congestive) heart failure: Secondary | ICD-10-CM | POA: Diagnosis not present

## 2023-07-05 LAB — BASIC METABOLIC PANEL
Anion gap: 12 (ref 5–15)
BUN: 41 mg/dL — ABNORMAL HIGH (ref 6–20)
CO2: 26 mmol/L (ref 22–32)
Calcium: 9.7 mg/dL (ref 8.9–10.3)
Chloride: 100 mmol/L (ref 98–111)
Creatinine, Ser: 1.73 mg/dL — ABNORMAL HIGH (ref 0.61–1.24)
GFR, Estimated: 47 mL/min — ABNORMAL LOW (ref >60)
Glucose, Bld: 100 mg/dL — ABNORMAL HIGH (ref 70–99)
Potassium: 4.3 mmol/L (ref 3.5–5.1)
Sodium: 138 mmol/L (ref 135–145)

## 2023-07-05 LAB — IRON AND TIBC
Iron: 80 ug/dL (ref 45–182)
Saturation Ratios: 20 % (ref 17.9–39.5)
TIBC: 405 ug/dL (ref 250–450)
UIBC: 325 ug/dL

## 2023-07-05 LAB — FERRITIN: Ferritin: 69 ng/mL (ref 24–336)

## 2023-07-05 LAB — TRANSFERRIN: Transferrin: 280 mg/dL (ref 180–329)

## 2023-07-05 LAB — MAGNESIUM: Magnesium: 2.2 mg/dL (ref 1.7–2.4)

## 2023-07-05 NOTE — Progress Notes (Signed)
 Progress Note   Patient: Charles [name]. WUJ:[id number] DOB: Apr 21, 1971 DOA: 07/03/2023     2 DOS: the patient was seen and examined on 07/05/2023   Brief hospital course: [name] was admitted to the hospital with the working diagnosis of heart failure exacerbation.   53 yo male with the past medical history of heart failure, hypertension, CKD stage 3b, and cocaine abuse who presented with dyspnea. Reported 3 days of progressive dyspnea, lower extremity edema and orthopnea.  Recent hospitalization for heart failure 02/07 to 06/24/23, mentioned that a few days after his discharge he decided to stop talking his medications.  On his initial physical examination his blood pressure was 140/113, HR 85, RR 22 and 02 saturation 95%.  Lungs with no wheezing or rhonchi, heart with S1 and S2 present and regular, abdomen with no distention, positive lower extremity edema.   Na 138, K 4,1 Cl 108 bicarbonate 21 glucose 92 bun 26 cr 1,74  BNP 1,597  High sensitive troponin 101, 89, 74  Wbc 3,7 hgb 11,2 plt 103   Toxicology positive for cocaine.   Chest radiograph with cardiomegaly, with bilateral hilar vascular congestion with no effusions or infiltrates.   EKG 83 bpm, left axis deviation, qtc 505, sinus rhythm with left atrial enlargement, no significant ST segment changes, negative T wave lead I and aVL.     Assessment and Plan: * Acute on chronic systolic CHF (congestive heart failure) (HCC) Limited echocardiogram with reduced LV systolic function with EF 30 to 35%, moderate LVH, RV systolic function with moderate reduction, moderate RV wall thickness, no significant valvular disease.   Urine output 2,900  ml  Systolic blood pressure 120 mmHg range  Continue losartan, empagliflozin and spironolactone.  Transition to oral loop diuretic in am.   Elevated high sensitive troponin due to heart failure, not acute coronary syndrome.   Essential hypertension Continue blood pressure control  with losartan, spironolactone. Volume status has improved.   Acute kidney injury superimposed on chronic kidney disease (HCC) CKD stage 3b.   Volume status has improved, renal function with serum cr at 1,73 with K at 4,3 and serum bicarbonate at 26  Na 138 Mg 2.2   Follow up renal function and electrolytes in am.   Polysubstance abuse (HCC) Positive acute cocaine intoxication.   Continue supportive medical therapy.  Hold on B blockade.   Anemia of chronic disease Follow up hgb is 11,1  Positive leukopenia at 3,7 and thrombocytopenia 105  B 12 level is 364  Iron 80, TIBC 405, transferrin saturation 20 and ferritin 69, transferrin 280.       Subjective: Patient is feeling better, dyspnea and edema have improved, no PND, orthopnea or lower extremity edema   Physical Exam: Vitals:   07/05/23 0607 07/05/23 0614 07/05/23 0735 07/05/23 1118  BP:   114/79 (!) 164/93  Pulse:   89 (!) 45  Resp:   (!) 23 19  Temp:   98.3 F (36.8 C) 98.3 F (36.8 C)  TempSrc:   Oral Oral  SpO2:   97% 98%  Weight: 69.9 kg 69.4 kg    Height:       Neurology awake and alert ENT with no pallor Cardiovascular with S1 and S2 present and regular with no gallops, rubs or murmurs No JVD No lower extremity edema Respiratory with no rales or wheezing, no rhonchi Abdomen with no distention  Data Reviewed:    Family Communication: no family at the bedside   Disposition: Status  is: Inpatient Remains inpatient appropriate because:  diuresis, possible discharge home tomorrow   Planned Discharge Destination: Home     Author: Coralie Keens, MD 07/05/2023 4:18 PM  For on call review [email or web address].

## 2023-07-05 NOTE — Plan of Care (Signed)
   Problem: Education: Goal: Knowledge of General Education information will improve Description Including pain rating scale, medication(s)/side effects and non-pharmacologic comfort measures Outcome: Progressing

## 2023-07-06 DIAGNOSIS — F1910 Other psychoactive substance abuse, uncomplicated: Secondary | ICD-10-CM | POA: Diagnosis not present

## 2023-07-06 DIAGNOSIS — I10 Essential (primary) hypertension: Secondary | ICD-10-CM | POA: Diagnosis not present

## 2023-07-06 DIAGNOSIS — I5023 Acute on chronic systolic (congestive) heart failure: Secondary | ICD-10-CM | POA: Diagnosis not present

## 2023-07-06 DIAGNOSIS — N179 Acute kidney failure, unspecified: Secondary | ICD-10-CM | POA: Diagnosis not present

## 2023-07-06 LAB — BASIC METABOLIC PANEL
Anion gap: 11 (ref 5–15)
BUN: 42 mg/dL — ABNORMAL HIGH (ref 6–20)
CO2: 22 mmol/L (ref 22–32)
Calcium: 9.1 mg/dL (ref 8.9–10.3)
Chloride: 101 mmol/L (ref 98–111)
Creatinine, Ser: 1.59 mg/dL — ABNORMAL HIGH (ref 0.61–1.24)
GFR, Estimated: 52 mL/min — ABNORMAL LOW (ref >60)
Glucose, Bld: 144 mg/dL — ABNORMAL HIGH (ref 70–99)
Potassium: 4 mmol/L (ref 3.5–5.1)
Sodium: 134 mmol/L — ABNORMAL LOW (ref 135–145)

## 2023-07-06 LAB — MAGNESIUM: Magnesium: 2.3 mg/dL (ref 1.7–2.4)

## 2023-07-06 MED ORDER — CYCLOBENZAPRINE HCL 5 MG PO TABS
5.0000 mg | ORAL_TABLET | Freq: Three times a day (TID) | ORAL | Status: DC | PRN
  Administered 2023-07-06 (×2): 5 mg via ORAL
  Filled 2023-07-06 (×2): qty 1

## 2023-07-06 MED ORDER — ORAL CARE MOUTH RINSE: 15.0000 mL | OROMUCOSAL | Status: DC | PRN

## 2023-07-06 NOTE — Progress Notes (Signed)
 Progress Note   Patient: Charles [name]. [id number]:[id number] DOB: 03/12/1971 DOA: 07/03/2023     3 DOS: the patient was seen and examined on 07/06/2023   Brief hospital course: Mr. Ala was admitted to the hospital with the working diagnosis of heart failure exacerbation.   53 yo male with the past medical history of heart failure, hypertension, CKD stage 3b, and cocaine abuse who presented with dyspnea. Reported 3 days of progressive dyspnea, lower extremity edema and orthopnea.  Recent hospitalization for heart failure 02/07 to 06/24/23, mentioned that a few days after his discharge he decided to stop talking his medications.  On his initial physical examination his blood pressure was 140/113, HR 85, RR 22 and 02 saturation 95%.  Lungs with no wheezing or rhonchi, heart with S1 and S2 present and regular, abdomen with no distention, positive lower extremity edema.   Na 138, K 4,1 Cl 108 bicarbonate 21 glucose 92 bun 26 cr 1,74  BNP 1,597  High sensitive troponin 101, 89, 74  Wbc 3,7 hgb 11,2 plt 103   Toxicology positive for cocaine.   Chest radiograph with cardiomegaly, with bilateral hilar vascular congestion with no effusions or infiltrates.   EKG 83 bpm, left axis deviation, qtc 505, sinus rhythm with left atrial enlargement, no significant ST segment changes, negative T wave lead I and aVL.   02/23 patient with euvolemic state. He is feeling dizziness and lightheadedness. Plan for possible discharge tomorrow.   Assessment and Plan: * Acute on chronic systolic CHF (congestive heart failure) (HCC) Limited echocardiogram with reduced LV systolic function with EF 30 to 35%, moderate LVH, RV systolic function with moderate reduction, moderate RV wall thickness, no significant valvular disease.   Urine output 2,775 ml  Systolic blood pressure 120 mmHg range  Continue losartan, empagliflozin and spironolactone.  Transition to oral loop diuretic tomorrow   Elevated high sensitive  troponin due to heart failure, not acute coronary syndrome.   Essential hypertension Continue blood pressure control with losartan, spironolactone. Volume status has improved.   Acute kidney injury superimposed on chronic kidney disease (HCC) CKD stage 3b. Hyponatremia   Renal function today with serum cr at 1,59 with K at 4,0 and serum bicarbonate at 22  Na 134 and Mg 2.3   Plan to resume loop diuretic in am.  Follow up renal function and electrolytes in am.   Polysubstance abuse (HCC) Positive acute cocaine intoxication.   Continue supportive medical therapy.  Hold on B blockade.   Anemia of chronic disease Follow up hgb is 11,1  Positive leukopenia at 3,7 and thrombocytopenia 105  B 12 level is 364  Iron 80, TIBC 405, transferrin saturation 20 and ferritin 69, transferrin 280.       Subjective: Patient feeling lightheaded and dizziness worse with movement, no chest pain, no PND or orthopnea.   Physical Exam: Vitals:   07/05/23 1923 07/06/23 0008 07/06/23 0440 07/06/23 0808  BP: (!) 118/91 139/63 (!) 140/76 122/84  Pulse: 97 81 69 87  Resp: 20 15 13 16   Temp: 98.1 F (36.7 C) 98.2 F (36.8 C) 98.4 F (36.9 C) 97.6 F (36.4 C)  TempSrc: Oral Oral Oral Oral  SpO2: 96% 96% 99% 97%  Weight:   71.1 kg   Height:       Neurology awake and alert ENT with no pallor Cardiovascular with S1 and S2 present and regular with no gallops, rubs or murmurs No JVD No lower extremity edema Respiratory with no rales or  wheezing, no rhonchi Abdomen with no distention  Data Reviewed:    Family Communication: no family at the bedside   Disposition: Status is: Inpatient Remains inpatient appropriate because: plan for discharge home tomorrow if no further dizziness or lightheadedness.   Planned Discharge Destination: Home     Author: Coralie Keens, MD 07/06/2023 1:55 PM  For on call review [email or web address].

## 2023-07-06 NOTE — Plan of Care (Signed)
   Problem: Education: Goal: Knowledge of General Education information will improve Description: Including pain rating scale, medication(s)/side effects and non-pharmacologic comfort measures Outcome: Progressing   Problem: Health Behavior/Discharge Planning: Goal: Ability to manage health-related needs will improve Outcome: Progressing   Problem: Nutrition: Goal: Adequate nutrition will be maintained Outcome: Progressing

## 2023-07-07 ENCOUNTER — Other Ambulatory Visit (HOSPITAL_COMMUNITY): Payer: Self-pay

## 2023-07-07 ENCOUNTER — Encounter (HOSPITAL_COMMUNITY): Payer: Self-pay | Admitting: Internal Medicine

## 2023-07-07 DIAGNOSIS — I10 Essential (primary) hypertension: Secondary | ICD-10-CM | POA: Diagnosis not present

## 2023-07-07 DIAGNOSIS — N179 Acute kidney failure, unspecified: Secondary | ICD-10-CM | POA: Diagnosis not present

## 2023-07-07 DIAGNOSIS — F1910 Other psychoactive substance abuse, uncomplicated: Secondary | ICD-10-CM | POA: Diagnosis not present

## 2023-07-07 DIAGNOSIS — I5023 Acute on chronic systolic (congestive) heart failure: Secondary | ICD-10-CM | POA: Diagnosis not present

## 2023-07-07 MED ORDER — GABAPENTIN 100 MG PO CAPS
100.0000 mg | ORAL_CAPSULE | Freq: Two times a day (BID) | ORAL | 0 refills | Status: DC
  Filled 2023-07-07: qty 180, 90d supply, fill #0

## 2023-07-07 MED ORDER — EMPAGLIFLOZIN 10 MG PO TABS
10.0000 mg | ORAL_TABLET | Freq: Every day | ORAL | 0 refills | Status: DC
  Filled 2023-07-07: qty 30, 30d supply, fill #0

## 2023-07-07 MED ORDER — FUROSEMIDE 40 MG PO TABS
40.0000 mg | ORAL_TABLET | Freq: Every day | ORAL | 0 refills | Status: DC
  Filled 2023-07-07: qty 30, 30d supply, fill #0

## 2023-07-07 MED ORDER — LOSARTAN POTASSIUM 25 MG PO TABS
25.0000 mg | ORAL_TABLET | Freq: Every day | ORAL | 0 refills | Status: DC
  Filled 2023-07-07: qty 30, 30d supply, fill #0

## 2023-07-07 MED ORDER — SPIRONOLACTONE 25 MG PO TABS
25.0000 mg | ORAL_TABLET | Freq: Every day | ORAL | 0 refills | Status: DC
  Filled 2023-07-07: qty 30, 30d supply, fill #0

## 2023-07-07 NOTE — TOC Transition Note (Addendum)
 Transition of Care [facility]) - Discharge Note   Patient Details  Name: Charles [name]. MRN: [id number] Date of Birth: 15-Sep-1970  Transition of Care [facility]) CM/SW Contact:  Leone Haven, RN Phone Number: 07/07/2023, 12:43 PM   Clinical Narrative:    For dc today, NCM assisted with bus pass.  Has no needs.  He states his sister has his bag of medications that he was admitted with and he will go by there to pick them up from her.         Patient Goals and CMS Choice            Discharge Placement                       Discharge Plan and Services Additional resources added to the After Visit Summary for                                       Social Drivers of Health (SDOH) Interventions SDOH Screenings   Food Insecurity: Food Insecurity Present (07/03/2023)  Housing: High Risk (07/07/2023)  Transportation Needs: Unmet Transportation Needs (07/07/2023)  Utilities: At Risk (07/03/2023)  Alcohol Screen: Medium Risk (02/24/2023)  Financial Resource Strain: Low Risk  (07/07/2023)  Social Connections: Unknown (09/24/2021)   [facility] from [facility]  Tobacco Use: High Risk (07/03/2023)     Readmission Risk Interventions    06/23/2023    4:32 PM 01/29/2023    8:43 AM 10/04/2022   11:27 AM  Readmission Risk Prevention Plan  Transportation Screening Complete Complete Complete  Medication Review Oceanographer) Complete Complete Complete  PCP or Specialist appointment within 3-5 days of discharge Complete Complete Complete  HRI or Home Care Consult Complete Complete Complete  SW Recovery Care/Counseling Consult Complete  Complete  Palliative Care Screening Not Applicable Not Applicable Not Applicable  Skilled Nursing Facility Not Applicable Not Applicable Not Applicable

## 2023-07-07 NOTE — Progress Notes (Signed)
   Heart Failure Stewardship Pharmacist Progress Note   PCP: Charles Rakers, MD PCP-Cardiologist: None    HPI:  53 yo M with a PMH of CHF, HTN, COPD, CKD IIIa, chronic hepatitis C, polysubstance abuse (cocaine and tobacco), and homelessness.    Frequent admissions for HF exacerbations due to noncompliance and cocaine use.   Last ECHO 01/28/23 with EF 25-30%, global hypokinesis, G2DD, RV mildly reduced, trivial MR. cMRI in 05/2021 with EF 28%, normal RV, mildly elevated ECV elevation, non-coronary LGE. Findings consistent with long-standing HTN.   Presented to the ED on 2/20 with shortness of breath and LE edema. Has not taken his medications in the last week. Last use of cocaine 1 hr prior to admission. CXR with cardiomegaly, central vascular distention, no overt edema. BNP 1597. Started on IV lasix. Repeat ECHO 2/21 showed LVEF 30-35%, moderate concentric LVH, RV moderately reduced, trivial MR.   Denies shortness of breath. Able to lay flat. Appetite is good. He is agreeable to taking medications besides lasix. Patient reports that he typically goes about a week after discharging from the hospital by the time the fluid comes back. He states that he notices it when he becomes short of breath when smoking but by the time this happens "its time to come to the hospital again." Discussed alternative strategies to taking lasix (as needed, a couple times a week etc) but patient became agitated and said he's just going to do whatever he wants to do anyway.    Current HF Medications: ACE/ARB/ARNI: losartan 25 mg daily MRA: spironolactone 25 mg daily SGLT2i: Jardiance 10 mg daily  Prior to admission HF Medications: ACE/ARB/ARNI: losartan 25 mg daily MRA: spironolactone 25 mg daily SGLT2i: Jardiance 10 mg daily  Pertinent Lab Values: Serum creatinine 1.59, BUN 42, Potassium 4.0, Sodium 134, BNP 1597.4, Magnesium 2.3, A1c 5.7   Vital Signs: Weight: 161 lbs (admission weight: 159 lbs) Blood  pressure: 130-150/90s  Heart rate: 60-80s  I/O: net -0.7L yesterday; net -9.1L since admission  Medication Assistance / Insurance Benefits Check: Does the patient have prescription insurance?  Yes Type of insurance plan: [place] Medicaid  Outpatient Pharmacy:  Prior to admission outpatient pharmacy: [facility] Is the patient willing to use [facility] TOC pharmacy at discharge? Yes Is the patient willing to transition their outpatient pharmacy to utilize a [facility] outpatient pharmacy?   Yes    Assessment: 1. Acute on chronic systolic CHF (LVEF 30-35%), due to NICM - polysubstance use, noncompliance, and HTN. NYHA class II symptoms. - He is at high risk for worsening HF and readmission primarily due to SDOH needs and inability to remain adherent to medication regimen.  - Euvolemic on exam. Patient likely will continue to be noncompliant to lasix at discharge. - Continue losartan 25 mg daily - Continue spironolactone 25 mg daily - Continue Jardiance 10 mg daily   Plan: 1) Medication changes recommended at this time: - None recommended  2) Patient assistance: - Has [place] Medicaid - HF Navigator has involved HF RNCM and SW to assist with other SDOH needs  3)  Education  - Patient has been educated on current HF medications and potential additions to HF medication regimen - Patient verbalizes understanding that over the next few months, these medication doses may change and more medications may be added to optimize HF regimen - Patient has been educated on basic disease state pathophysiology and goals of therapy   Sharen Hones, PharmD, BCPS Heart Failure Stewardship Pharmacist Phone [phone]

## 2023-07-07 NOTE — Discharge Summary (Signed)
 Physician Discharge Summary   Patient: Charles [name]. MRN: [id number] DOB: 1970/09/09  Admit date:     07/03/2023  Discharge date: 07/07/23  Discharge Physician: York Ram Atiyana Welte   PCP: Renaye Rakers, MD   Recommendations at discharge:    Patient is committed to follow up as outpatient in the heart failure clinic and to be adherent to his medications. All his meds have been sent to the [facility] pharmacy.  Follow up with Dr Parke Simmers in 7 to 10 days Follow up with Heart Failure Clinic next week as scheduled.   Discharge Diagnoses: Principal Problem:   Acute on chronic systolic CHF (congestive heart failure) (HCC) Active Problems:   Essential hypertension   Acute kidney injury superimposed on chronic kidney disease (HCC)   Polysubstance abuse (HCC)   Anemia of chronic disease  Resolved Problems:   * No resolved hospital problems. [facility] Course: Charles [name] was admitted to the hospital with the working diagnosis of heart failure exacerbation.   53 yo male with the past medical history of heart failure, hypertension, CKD stage 3b, and cocaine abuse who presented with dyspnea. Reported 3 days of progressive dyspnea, lower extremity edema and orthopnea.  Recent hospitalization for heart failure 02/07 to 06/24/23, mentioned that a few days after his discharge he decided to stop talking his medications.  On his initial physical examination his blood pressure was 140/113, HR 85, RR 22 and 02 saturation 95%.  Lungs with no wheezing or rhonchi, heart with S1 and S2 present and regular, abdomen with no distention, positive lower extremity edema.   Na 138, K 4,1 Cl 108 bicarbonate 21 glucose 92 bun 26 cr 1,74  BNP 1,597  High sensitive troponin 101, 89, 74  Wbc 3,7 hgb 11,2 plt 103   Toxicology positive for cocaine.   Chest radiograph with cardiomegaly, with bilateral hilar vascular congestion with no effusions or infiltrates.   EKG 83 bpm, left axis deviation, qtc 505, sinus  rhythm with left atrial enlargement, no significant ST segment changes, negative T wave lead I and aVL.   02/23 patient with euvolemic state. He is feeling dizziness and lightheadedness. Plan for possible discharge tomorrow.  02/24 continue with euvolemic state. Plan for follow up with the heart failure clinic next week.  Assessment and Plan: * Acute on chronic systolic CHF (congestive heart failure) (HCC) Limited echocardiogram with reduced LV systolic function with EF 30 to 35%, moderate LVH, RV systolic function with moderate reduction, moderate RV wall thickness, no significant valvular disease.   Patient was placed on IV furosemide for diuresis, negative fluid balance was achieved, - 9,130 ml with significant improvement in his symptoms.  Plan to continue guideline medical therapy with losartan, empagliflozin and spironolactone.  Furosemide daily   Elevated high sensitive troponin due to heart failure, not acute coronary syndrome.   Essential hypertension Continue blood pressure control with losartan, spironolactone.  Acute kidney injury superimposed on chronic kidney disease (HCC) CKD stage 3b. Hyponatremia   Renal function at the time of his discharge had a serum cr of 1,59 with K at 4,0 and serum bicarbonate at 22  Na 134 Mg 2,3   Continue spironolactone, SGLT 2 inh and furosemide.  Follow up renal function as outpatient.   Polysubstance abuse (HCC) Positive acute cocaine intoxication.  He has been advices about cessation of substance use.  Avoid use of  B blockade.   Anemia of chronic disease Follow up hgb is 11,1  Positive leukopenia at 3,7 and  thrombocytopenia 105  B 12 level is 364  Iron 80, TIBC 405, transferrin saturation 20 and ferritin 69, transferrin 280.         Consultants: none  Procedures performed: none   Disposition: Home Diet recommendation:  Discharge Diet Orders (From admission, onward)     Start     Ordered   07/07/23 0000  Diet - low  sodium heart healthy        07/07/23 1229           Cardiac diet DISCHARGE MEDICATION: Allergies as of 07/07/2023       Reactions   Banana Nausea And Vomiting   Egg-derived Products Nausea And Vomiting   Pork-derived Products Other (See Comments)   No pork products d/t Religious preference         Medication List     TAKE these medications    albuterol 108 (90 Base) MCG/ACT inhaler Commonly known as: VENTOLIN HFA Inhale 2 puffs into the lungs every 4 (four) hours as needed for wheezing or shortness of breath.   empagliflozin 10 MG Tabs tablet Commonly known as: Jardiance Take 1 tablet (10 mg total) by mouth daily.   furosemide 40 MG tablet Commonly known as: LASIX Take 1 tablet (40 mg total) by mouth daily.   gabapentin 100 MG capsule Commonly known as: NEURONTIN Take 1 capsule (100 mg total) by mouth 2 (two) times daily.   losartan 25 MG tablet Commonly known as: COZAAR Take 1 tablet (25 mg total) by mouth daily.   spironolactone 25 MG tablet Commonly known as: ALDACTONE Take 1 tablet (25 mg total) by mouth daily.        Follow-up Information     [facility] Follow up on 07/22/2023.   Why: hospital follow up at 9:40 Contact information: [facility] [address] [phone]        [facility]. Go in 10 day(s).   Specialty: Cardiology Why: Hospital follow up 07/17/2023 @ 3:30 pm PLEASE bring a current medication list to appointment FREE valet parking, Entrance C, off [employer] information: [address] [place] [address] [phone]               Discharge Exam: Filed Weights   07/05/23 3664 07/06/23 0440 07/07/23 0658  Weight: 69.4 kg 71.1 kg 73.4 kg   BP 136/84 (BP Location: Right Arm)   Pulse 79   Temp 98 F (36.7 C) (Oral)   Resp (!) 21   Ht 5\' 6"  (1.676 m)   Wt 73.4 kg   SpO2 100%   BMI 26.13 kg/m   Patient is feeling well, no chest pain,  no dyspnea, no PND or orthopnea.   Neurology awake and alert ENT with no pallor Cardiovascular with S1 and S2 present and regular with no gallops, rubs or murmurs No JVD No lower extremity edema Respiratory with no rales or wheezing Abdomen with no distention   Condition at discharge: stable  The results of significant diagnostics from this hospitalization (including imaging, microbiology, ancillary and laboratory) are listed below for reference.   Imaging Studies: ECHOCARDIOGRAM LIMITED Result Date: 07/04/2023    ECHOCARDIOGRAM LIMITED REPORT   Patient Name:   Diangelo Radel. Date of Exam: 07/04/2023 Medical Rec #:  [id number]             Height:       66.0 in Accession #:    [id number]  Weight:       153.4 lb Date of Birth:  07/14/1970             BSA:          1.787 m Patient Age:    52 years              BP:           128/73 mmHg Patient Gender: M                     HR:           72 bpm. Exam Location:  Inpatient Procedure: Limited Echo, Cardiac Doppler and Limited Color Doppler (Both            Spectral and Color Flow Doppler were utilized during procedure). Indications:    Dyspnea  History:        Patient has prior history of Echocardiogram examinations, most                 recent 01/28/2023. CHF, COPD; Risk Factors:Hypertension.  Sonographer:    Amy Chionchio Referring Phys: [id number] Loui Massenburg DANIEL Crews Mccollam IMPRESSIONS  1. Left ventricular ejection fraction, by estimation, is 30 to 35%. The left ventricle has moderately decreased function. There is moderate concentric left ventricular hypertrophy. Left ventricular diastolic parameters are consistent with Grade I diastolic dysfunction (impaired relaxation).  2. Right ventricular systolic function is moderately reduced. Moderately increased right ventricular wall thickness.  3. The mitral valve is abnormal. Trivial mitral valve regurgitation.  4. The tricuspid valve is abnormal. Tricuspid valve regurgitation is trivial.  5. The  aortic valve is normal in structure. Aortic valve regurgitation is not visualized. Conclusion(s)/Recommendation(s): There is significant biventricular hypertrophy and dysfunction suggestive of possible infiltrative cardiomyopathy but cardiac MRI in 2023 did not suggest infiltrtive process. FINDINGS  Left Ventricle: Left ventricular ejection fraction, by estimation, is 30 to 35%. The left ventricle has moderately decreased function. Strain imaging was not performed. There is moderate concentric left ventricular hypertrophy. Left ventricular diastolic parameters are consistent with Grade I diastolic dysfunction (impaired relaxation). Right Ventricle: Moderately increased right ventricular wall thickness. Right ventricular systolic function is moderately reduced. Left Atrium: Left atrial size was normal in size. Right Atrium: Right atrial size was normal in size. Pericardium: There is no evidence of pericardial effusion. Mitral Valve: The mitral valve is abnormal. There is mild thickening of the mitral valve leaflet(s). Trivial mitral valve regurgitation. Tricuspid Valve: The tricuspid valve is abnormal. Tricuspid valve regurgitation is trivial. Aortic Valve: The aortic valve is normal in structure. Aortic valve regurgitation is not visualized. Aortic valve mean gradient measures 9.0 mmHg. Aortic valve peak gradient measures 17.8 mmHg. Aortic valve area, by VTI measures 1.50 cm. Pulmonic Valve: The pulmonic valve was not well visualized. Aorta: The aortic root and ascending aorta are structurally normal, with no evidence of dilitation. Additional Comments: 3D imaging was not performed.  LEFT VENTRICLE PLAX 2D LVIDd:         4.20 cm LVIDs:         4.10 cm LV PW:         1.30 cm LV IVS:        1.40 cm LVOT diam:     2.10 cm LV SV:         38 LV SV Index:   21 LVOT Area:     3.46 cm  LV Volumes (MOD) LV vol d, MOD A2C: 191.0 ml LV vol d, MOD  A4C: 205.0 ml LV vol s, MOD A2C: 119.0 ml LV vol s, MOD A4C: 126.0 ml LV SV MOD  A2C:     72.0 ml LV SV MOD A4C:     205.0 ml LV SV MOD BP:      80.9 ml RIGHT VENTRICLE TAPSE (M-mode): 1.2 cm AORTIC VALVE AV Area (Vmax):    1.49 cm AV Area (Vmean):   1.40 cm AV Area (VTI):     1.50 cm AV Vmax:           211.00 cm/s AV Vmean:          141.000 cm/s AV VTI:            0.253 m AV Peak Grad:      17.8 mmHg AV Mean Grad:      9.0 mmHg LVOT Vmax:         90.70 cm/s LVOT Vmean:        57.100 cm/s LVOT VTI:          0.110 m LVOT/AV VTI ratio: 0.43  AORTA Ao Asc diam: 3.20 cm MITRAL VALVE MV Area (PHT): 3.91 cm    SHUNTS MV Decel Time: 194 msec    Systemic VTI:  0.11 m MV E velocity: 46.50 cm/s  Systemic Diam: 2.10 cm MV A velocity: 52.50 cm/s MV E/A ratio:  0.89 Arvilla Meres MD Electronically signed by Arvilla Meres MD Signature Date/Time: 07/04/2023/9:50:41 AM    Final    DG Chest Port 1 View Result Date: 07/03/2023 CLINICAL DATA:  Shortness of breath, CHF exacerbation earlier this month. EXAM: PORTABLE CHEST 1 VIEW COMPARISON:  Portable chest 06/20/2023 FINDINGS: The heart again moderately enlarged. There central vascular distension but the interstitial edema noted previously is not seen today. The lungs are clear of infiltrates. The mediastinum is normally outlined. There is bridging enthesopathy and slight dextroscoliosis of the thoracic spine. No new osseous findings. IMPRESSION: Cardiomegaly with central vascular distension but overt edema. No other evidence of acute chest process. Electronically Signed   By: Almira Bar M.D.   On: 07/03/2023 01:44   DG Lumbar Spine 2-3 Views Result Date: 06/22/2023 CLINICAL DATA:  Back pain EXAM: LUMBAR SPINE - 2-3 VIEW; SACRUM AND COCCYX - 2+ VIEW COMPARISON:  None available FINDINGS: Lumbar spine: Alignment is within normal limits. Mild disc height loss at L5-S1. Mild endplate spurring at L4-L5. Vertebral body heights maintained. Mild to moderate facet degenerative changes seen throughout the lower lumbar spine. Sacrum/coccyx: No fracture or  dislocation. Mild to moderate right and mild left hip osteoarthrosis. Mild irregularity and sclerosis of the right femoral head suspicious for avascular necrosis. IMPRESSION: 1. Mild degenerative changes of the lower lumbar spine. 2. No acute osseous abnormality of the sacrum/coccyx. 3. Mild to moderate right and mild left hip osteoarthrosis. 4. Mild irregularity and sclerosis of the right femoral head suspicious for avascular necrosis. Electronically Signed   By: Acquanetta Belling M.D.   On: 06/22/2023 11:09   DG Sacrum/Coccyx Result Date: 06/22/2023 CLINICAL DATA:  Back pain EXAM: LUMBAR SPINE - 2-3 VIEW; SACRUM AND COCCYX - 2+ VIEW COMPARISON:  None available FINDINGS: Lumbar spine: Alignment is within normal limits. Mild disc height loss at L5-S1. Mild endplate spurring at L4-L5. Vertebral body heights maintained. Mild to moderate facet degenerative changes seen throughout the lower lumbar spine. Sacrum/coccyx: No fracture or dislocation. Mild to moderate right and mild left hip osteoarthrosis. Mild irregularity and sclerosis of the right femoral head suspicious for avascular necrosis. IMPRESSION:  1. Mild degenerative changes of the lower lumbar spine. 2. No acute osseous abnormality of the sacrum/coccyx. 3. Mild to moderate right and mild left hip osteoarthrosis. 4. Mild irregularity and sclerosis of the right femoral head suspicious for avascular necrosis. Electronically Signed   By: Acquanetta Belling M.D.   On: 06/22/2023 11:09   DG Chest Portable 1 View Result Date: 06/20/2023 CLINICAL DATA:  Dyspnea EXAM: PORTABLE CHEST 1 VIEW COMPARISON:  Chest x-ray 06/08/2023 FINDINGS: Heart is enlarged. There central pulmonary vascular congestion. There is no focal lung infiltrate, pleural effusion or pneumothorax. No acute fractures are seen. IMPRESSION: Cardiomegaly with central pulmonary vascular congestion. Electronically Signed   By: Darliss Cheney M.D.   On: 06/20/2023 17:46   CT HEAD WO CONTRAST ( ) Result Date:  06/09/2023 CLINICAL DATA:  53 year old male altered mental status. EXAM: CT HEAD WITHOUT CONTRAST TECHNIQUE: Contiguous axial images were obtained from the base of the skull through the vertex without intravenous contrast. RADIATION DOSE REDUCTION: This exam was performed according to the departmental dose-optimization program which includes automated exposure control, adjustment of the mA and/or kV according to patient size and/or use of iterative reconstruction technique. COMPARISON:  Head CT 11/01/2021. brain MRI 06/06/2020. FINDINGS: Brain: Cerebral volume remains normal. No midline shift, ventriculomegaly, mass effect, evidence of mass lesion, intracranial hemorrhage or evidence of cortically based acute infarction. Gray-white matter differentiation is within normal limits throughout the brain. Vascular: Calcified atherosclerosis at the skull base. No suspicious intracranial vascular hyperdensity. Skull: Chronic left lamina papyracea fracture. Elongated bilateral stylohyoid ligament calcification. Carious maxillary dentition. No acute osseous abnormality identified. Sinuses/Orbits: Stable generally well aerated paranasal sinuses, tympanic cavities, mastoids. Scattered ethmoid mucosal thickening. Other: No acute orbit or scalp soft tissue finding. IMPRESSION: 1. Stable and normal for age noncontrast CT appearance of the brain. 2. Chronic left lamina papyracea fracture. Carious maxillary dentition. Electronically Signed   By: Odessa Fleming M.D.   On: 06/09/2023 04:27   DG CHEST PORT 1 VIEW Result Date: 06/08/2023 CLINICAL DATA:  [id number] Hypoxia [id number] EXAM: PORTABLE CHEST 1 VIEW COMPARISON:  Chest x-ray 06/08/2023, chest x-ray 01/27/2023 FINDINGS: Patient is rotated Cardiomegaly. Nonvisualization of the right heart border may be due to patient rotation. Otherwise heart and mediastinal contours are within normal limits. Question right middle lobe airspace opacity. Mild pulmonary edema. No pleural effusion. No  pneumothorax. No acute osseous abnormality. IMPRESSION: 1. Markedly rotated chest x-ray with query developing right middle lobe airspace opacity. 2. Cardiomegaly with mild pulmonary edema. Electronically Signed   By: Tish Frederickson M.D.   On: 06/08/2023 23:58   DG Chest 2 View Result Date: 06/08/2023 CLINICAL DATA:  Chest pain EXAM: CHEST - 2 VIEW COMPARISON:  Chest radiograph 05/29/2023 FINDINGS: Cardiomegaly. Elevation right hemidiaphragm. Pulmonary vascular redistribution and mild interstitial edema. No pleural effusion. Thoracic spine degenerative changes. Coil projects over the left upper quadrant. IMPRESSION: Cardiomegaly with mild interstitial edema. Electronically Signed   By: Annia Belt M.D.   On: 06/08/2023 16:14    Microbiology: Results for orders placed or performed during the hospital encounter of 06/20/23  Resp panel by RT-PCR (RSV, Flu A&B, Covid) Anterior Nasal Swab     Status: None   Collection Time: 06/20/23  4:13 PM   Specimen: Anterior Nasal Swab  Result Value Ref Range Status   SARS Coronavirus 2 by RT PCR NEGATIVE NEGATIVE Final   Influenza A by PCR NEGATIVE NEGATIVE Final   Influenza B by PCR NEGATIVE NEGATIVE Final    Comment: (NOTE) The  Xpert Xpress SARS-CoV-2/FLU/RSV plus assay is intended as an aid in the diagnosis of influenza from Nasopharyngeal swab specimens and should not be used as a sole basis for treatment. Nasal washings and aspirates are unacceptable for Xpert Xpress SARS-CoV-2/FLU/RSV testing.  Fact Sheet for Patients: BloggerCourse.com  Fact Sheet for Healthcare Providers: SeriousBroker.it  This test is not yet approved or cleared by the [place] FDA and has been authorized for detection and/or diagnosis of SARS-CoV-2 by FDA under an Emergency Use Authorization (EUA). This EUA will remain in effect (meaning this test can be used) for the duration of the COVID-19 declaration under Section  564(b)(1) of the Act, 21 U.S.C. section 360bbb-3(b)(1), unless the authorization is terminated or revoked.     Resp Syncytial Virus by PCR NEGATIVE NEGATIVE Final    Comment: (NOTE) Fact Sheet for Patients: BloggerCourse.com  Fact Sheet for Healthcare Providers: SeriousBroker.it  This test is not yet approved or cleared by the [place] FDA and has been authorized for detection and/or diagnosis of SARS-CoV-2 by FDA under an Emergency Use Authorization (EUA). This EUA will remain in effect (meaning this test can be used) for the duration of the COVID-19 declaration under Section 564(b)(1) of the Act, 21 U.S.C. section 360bbb-3(b)(1), unless the authorization is terminated or revoked.  Performed at [facility], [address] [place], [place] [address]     Labs: CBC: Recent Labs  Lab 07/03/23 0146 07/03/23 0504  WBC 3.7* 3.7*  NEUTROABS 1.6* 1.7  HGB 11.2* 11.1*  HCT 32.8* 33.9*  MCV 101.5* 103.4*  PLT 103* 105*   Basic Metabolic Panel: Recent Labs  Lab 07/03/23 0146 07/03/23 0504 07/04/23 0246 07/05/23 0243 07/06/23 0239  NA 138 139 138 138 134*  K 4.1 4.0 4.0 4.3 4.0  CL 108 106 103 100 101  CO2 21* 22 25 26 22   GLUCOSE 92 110* 142* 100* 144*  BUN 26* 24* 33* 41* 42*  CREATININE 1.74* 1.84* 1.58* 1.73* 1.59*  CALCIUM 9.1 8.8* 9.2 9.7 9.1  MG  --  1.9 2.0 2.2 2.3   Liver Function Tests: Recent Labs  Lab 07/03/23 0146 07/03/23 0504  AST 41 39  ALT 33 30  ALKPHOS 56 55  BILITOT 1.0 0.9  PROT 6.7 6.6  ALBUMIN 3.4* 3.2*   CBG: No results for input(s): "GLUCAP" in the last 168 hours.  Discharge time spent: greater than 30 minutes.  Signed: Coralie Keens, MD [facility] 07/07/2023

## 2023-07-07 NOTE — Care Management Important Message (Signed)
 Important Message  Patient Details  Name: Charles [name]. MRN: [id number] Date of Birth: 11/18/1970   Important Message Given:  Yes - Medicare IM     [name] 07/07/2023, 11:29 AM

## 2023-07-07 NOTE — Progress Notes (Addendum)
 Heart Failure Nurse Navigator Progress Note  PCP: Charles Rakers, MD PCP-Cardiologist: None Admission Diagnosis: Acute on Chronic Congestive heart Failure.  [facility]: [facility]  Presentation:   Charles [name]. presented with shortness of breath, 2+ BLE edema and ankle swelling, reported he had just used cocaine 1 hour prior. [place], Patient stated he hadn't taken his medications for over 1 week. BP 140/113, HR 85, BNP 1,597, UDS + cocaine. Recently admitted for the same thing 07/03/23 and 2/7/- 06/24/2023. CXR with cardiomegaly with interval increase in central vascular distention.   Patient was educated on the sign and symptoms of heart failure, daily weights, when to call a doctor or go to the Hospital. Diet/ fluid restrictions ( patient reports to eating and drinking whatever people on the streets will give him) education on taking his medications as prescribed, explained our HF TOC pharmacy to him and he reported that " I can get him the medications, however he won't take them because he doesn't like pills". Spoke about the importance of coming to his HF [facility] appointment, he said that he doesn't need a ride he will have his sister Charles [name], or his niece Charles [name] drive him. Patient verbalized his understanding of education, Spoke with CSW for resources and help for his homelessness , substance cessation, reported that multiple resources were given to the patient. Per Dr. Ella [name] A HF TOC appointment was scheduled for 07/17/2023 @ 07/17/2023 @ 3 :30 pm.   ECHO/ LVEF: 25-30%  Clinical Course:  Past Medical History:  Diagnosis Date   Aplastic anemia (HCC)    Arthritis    "left ankle" (10/17/2014)   Bilateral pneumonia 10/17/2014   Chronic combined systolic and diastolic heart failure (HCC)    Hepatitis    "think it was B" (10/17/2014)   History of blood transfusion "several"   "related to aplastic anemia"   Hypertension    Laceration of  spleen    s/p embolization   Polysubstance abuse (HCC)    cocaine, benzo's, opiates, THC   Sleep apnea    "wore mask in prison; 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" (10/17/2014)     Social History   Socioeconomic History   Marital status: Single    Spouse name: Not on file   Number of children: Not on file   Years of education: Not on file   Highest education level: Not on file  Occupational History   Occupation: Unemployed  Tobacco Use   Smoking status: Every Day    Current packs/day: 0.25    Average packs/day: 0.3 packs/day for 30.0 years (7.5 ttl pk-yrs)    Types: Cigarettes, Cigars   Smokeless tobacco: Former  Building services engineer status: Never Used  Substance and Sexual Activity   Alcohol use: Yes    Comment: occ   Drug use: Yes    Frequency: 7.0 times per week    Types: Cocaine, Marijuana    Comment: Uses cocaine everyday   Sexual activity: Not Currently  Other Topics Concern   Not on file  Social History Narrative   Not on file   Social Drivers of Health   Financial Resource Strain: High Risk (02/24/2023)   Overall Financial Resource Strain (CARDIA)    Difficulty of Paying Living Expenses: Very hard  Food Insecurity: Food Insecurity Present (07/03/2023)   Hunger Vital Sign    Worried About Running Out of Food in the Last Year: Sometimes true  Ran Out of Food in the Last Year: Sometimes true  Transportation Needs: Unmet Transportation Needs (07/03/2023)   PRAPARE - Administrator, Civil Service (Medical): Yes    Lack of Transportation (Non-Medical): Yes  Physical Activity: Not on file  Stress: Not on file  Social Connections: Unknown (09/24/2021)   Received from [facility]   Social Network    Social Network: Not on file   Education Assessment and Provision:  Detailed education and instructions provided on heart failure disease management including the following:  Signs and symptoms of Heart Failure When to call the physician Importance of  daily weights Low sodium diet Fluid restriction Medication management Anticipated future follow-up appointments  Patient education given on each of the above topics.  Patient acknowledges understanding via teach back method and acceptance of all instructions.  Education Materials:  "Living Better With Heart Failure" Booklet, HF zone tool, & Daily Weight Tracker Tool.  Patient has scale at home: Per patient "Nope" won't use it, and I have no place to put it".  Patient has pill box at home: No    High Risk Criteria for Readmission and/or Poor Patient Outcomes: Heart failure hospital admissions (last 6 months): 6  No Show rate: 9% ( No show HF TOC x1, last 03/07/23)  Difficult social situation: yes homeless Demonstrates medication adherence: No, refuses to take any medications, " he stated' I don't like to take pills".  Primary Language: English Literacy level: Reading, writing, and comprehension  Barriers of Care:   Medication/ appointment compliance Substance abuse Diet/ fluid restrictions/ daily weights Social needs ( homeless)   Considerations/Referrals:   Referral made to Heart Failure Pharmacist Stewardship: Yes Referral made to Heart Failure CSW/NCM TOC: Yes, Homeless, "at times transportation, substance cessation.  Referral made to Heart & Vascular TOC clinic: Yes, per Dr. Ella [name], 07/17/2023 @ 3:30 pm.   Items for Follow-up on DC/TOC: Continued HF education,  Medication/ appointment compliance Diet/ fluid restrictions ( salt, soda) daily weights Social needs ( homeless, transportation at times, Substance cessation)     Charles [name], BSN, Scientist, clinical (histocompatibility and immunogenetics) Only

## 2023-07-08 ENCOUNTER — Telehealth: Payer: Self-pay

## 2023-07-08 NOTE — Transitions of Care (Post Inpatient/ED Visit) (Signed)
   07/08/2023  Name: Charles [name]. MRN: [id number] DOB: Jul 12, 1970  Today's TOC FU Call Status: Today's TOC FU Call Status:: Unsuccessful Call (1st Attempt) Unsuccessful Call (1st Attempt) Date: 07/08/23  Attempted to reach the patient regarding the most recent Inpatient/ED visit.  Follow Up Plan: Additional outreach attempts will be made to reach the patient to complete the Transitions of Care (Post Inpatient/ED visit) call.   Hilbert Odor RN, CCM [place]  VBCI-Population Health RN Care Manager [phone]

## 2023-07-13 DIAGNOSIS — F1410 Cocaine abuse, uncomplicated: Secondary | ICD-10-CM | POA: Diagnosis not present

## 2023-07-13 DIAGNOSIS — I10 Essential (primary) hypertension: Secondary | ICD-10-CM | POA: Diagnosis not present

## 2023-07-13 DIAGNOSIS — R69 Illness, unspecified: Secondary | ICD-10-CM | POA: Diagnosis not present

## 2023-07-13 DIAGNOSIS — G9340 Encephalopathy, unspecified: Secondary | ICD-10-CM | POA: Diagnosis not present

## 2023-07-14 ENCOUNTER — Emergency Department (HOSPITAL_COMMUNITY)

## 2023-07-14 ENCOUNTER — Observation Stay (HOSPITAL_COMMUNITY)
Admission: EM | Admit: 2023-07-14 | Discharge: 2023-07-15 | Disposition: A | Discharge status: Home / Self Care | Attending: Internal Medicine | Admitting: Internal Medicine

## 2023-07-14 ENCOUNTER — Other Ambulatory Visit: Payer: Self-pay

## 2023-07-14 ENCOUNTER — Encounter (HOSPITAL_COMMUNITY): Payer: Self-pay | Admitting: Emergency Medicine

## 2023-07-14 DIAGNOSIS — R7989 Other specified abnormal findings of blood chemistry: Secondary | ICD-10-CM | POA: Diagnosis not present

## 2023-07-14 DIAGNOSIS — D539 Nutritional anemia, unspecified: Secondary | ICD-10-CM | POA: Diagnosis not present

## 2023-07-14 DIAGNOSIS — R4182 Altered mental status, unspecified: Secondary | ICD-10-CM

## 2023-07-14 DIAGNOSIS — R059 Cough, unspecified: Secondary | ICD-10-CM | POA: Diagnosis not present

## 2023-07-14 DIAGNOSIS — M7989 Other specified soft tissue disorders: Secondary | ICD-10-CM | POA: Insufficient documentation

## 2023-07-14 DIAGNOSIS — F1410 Cocaine abuse, uncomplicated: Secondary | ICD-10-CM | POA: Diagnosis not present

## 2023-07-14 DIAGNOSIS — N179 Acute kidney failure, unspecified: Secondary | ICD-10-CM | POA: Insufficient documentation

## 2023-07-14 DIAGNOSIS — I5042 Chronic combined systolic (congestive) and diastolic (congestive) heart failure: Secondary | ICD-10-CM | POA: Insufficient documentation

## 2023-07-14 DIAGNOSIS — I2489 Other forms of acute ischemic heart disease: Secondary | ICD-10-CM | POA: Diagnosis not present

## 2023-07-14 DIAGNOSIS — G9341 Metabolic encephalopathy: Secondary | ICD-10-CM

## 2023-07-14 DIAGNOSIS — R9431 Abnormal electrocardiogram [ECG] [EKG]: Secondary | ICD-10-CM

## 2023-07-14 DIAGNOSIS — I130 Hypertensive heart and chronic kidney disease with heart failure and stage 1 through stage 4 chronic kidney disease, or unspecified chronic kidney disease: Secondary | ICD-10-CM | POA: Insufficient documentation

## 2023-07-14 DIAGNOSIS — Z7984 Long term (current) use of oral hypoglycemic drugs: Secondary | ICD-10-CM | POA: Insufficient documentation

## 2023-07-14 DIAGNOSIS — Z5900 Homelessness unspecified: Secondary | ICD-10-CM | POA: Insufficient documentation

## 2023-07-14 DIAGNOSIS — R2232 Localized swelling, mass and lump, left upper limb: Secondary | ICD-10-CM | POA: Diagnosis not present

## 2023-07-14 DIAGNOSIS — N1832 Chronic kidney disease, stage 3b: Secondary | ICD-10-CM

## 2023-07-14 DIAGNOSIS — E875 Hyperkalemia: Secondary | ICD-10-CM | POA: Insufficient documentation

## 2023-07-14 DIAGNOSIS — D631 Anemia in chronic kidney disease: Secondary | ICD-10-CM | POA: Diagnosis not present

## 2023-07-14 DIAGNOSIS — I5032 Chronic diastolic (congestive) heart failure: Secondary | ICD-10-CM | POA: Diagnosis not present

## 2023-07-14 DIAGNOSIS — R519 Headache, unspecified: Secondary | ICD-10-CM | POA: Diagnosis not present

## 2023-07-14 DIAGNOSIS — R0602 Shortness of breath: Secondary | ICD-10-CM | POA: Diagnosis not present

## 2023-07-14 DIAGNOSIS — I517 Cardiomegaly: Secondary | ICD-10-CM | POA: Diagnosis not present

## 2023-07-14 DIAGNOSIS — G9340 Encephalopathy, unspecified: Secondary | ICD-10-CM | POA: Diagnosis not present

## 2023-07-14 DIAGNOSIS — I10 Essential (primary) hypertension: Secondary | ICD-10-CM

## 2023-07-14 DIAGNOSIS — Z91148 Patient's other noncompliance with medication regimen for other reason: Secondary | ICD-10-CM | POA: Diagnosis not present

## 2023-07-14 DIAGNOSIS — R2231 Localized swelling, mass and lump, right upper limb: Secondary | ICD-10-CM | POA: Diagnosis not present

## 2023-07-14 DIAGNOSIS — M6282 Rhabdomyolysis: Secondary | ICD-10-CM | POA: Diagnosis not present

## 2023-07-14 DIAGNOSIS — R0989 Other specified symptoms and signs involving the circulatory and respiratory systems: Secondary | ICD-10-CM | POA: Diagnosis not present

## 2023-07-14 DIAGNOSIS — Z79899 Other long term (current) drug therapy: Secondary | ICD-10-CM | POA: Diagnosis not present

## 2023-07-14 DIAGNOSIS — R52 Pain, unspecified: Secondary | ICD-10-CM | POA: Diagnosis present

## 2023-07-14 DIAGNOSIS — M79641 Pain in right hand: Secondary | ICD-10-CM | POA: Diagnosis not present

## 2023-07-14 DIAGNOSIS — N1831 Chronic kidney disease, stage 3a: Secondary | ICD-10-CM | POA: Diagnosis present

## 2023-07-14 DIAGNOSIS — F17210 Nicotine dependence, cigarettes, uncomplicated: Secondary | ICD-10-CM | POA: Diagnosis not present

## 2023-07-14 DIAGNOSIS — Z1152 Encounter for screening for COVID-19: Secondary | ICD-10-CM | POA: Insufficient documentation

## 2023-07-14 HISTORY — DX: Metabolic encephalopathy: G93.41

## 2023-07-14 LAB — COMPREHENSIVE METABOLIC PANEL
ALT: 40 U/L (ref 0–44)
ALT: 30 U/L (ref 0–44)
AST: 69 U/L — ABNORMAL HIGH (ref 15–41)
AST: 34 U/L (ref 15–41)
Albumin: 3 g/dL — ABNORMAL LOW (ref 3.5–5.0)
Albumin: 3 g/dL — ABNORMAL LOW (ref 3.5–5.0)
Alkaline Phosphatase: 54 U/L (ref 38–126)
Alkaline Phosphatase: 57 U/L (ref 38–126)
Anion gap: 10 (ref 5–15)
Anion gap: 6 (ref 5–15)
BUN: 31 mg/dL — ABNORMAL HIGH (ref 6–20)
BUN: 25 mg/dL — ABNORMAL HIGH (ref 6–20)
CO2: 21 mmol/L — ABNORMAL LOW (ref 22–32)
CO2: 26 mmol/L (ref 22–32)
Calcium: 8.6 mg/dL — ABNORMAL LOW (ref 8.9–10.3)
Calcium: 8.9 mg/dL (ref 8.9–10.3)
Chloride: 105 mmol/L (ref 98–111)
Chloride: 106 mmol/L (ref 98–111)
Creatinine, Ser: 1.54 mg/dL — ABNORMAL HIGH (ref 0.61–1.24)
Creatinine, Ser: 1.63 mg/dL — ABNORMAL HIGH (ref 0.61–1.24)
GFR, Estimated: 54 mL/min — ABNORMAL LOW (ref >60)
GFR, Estimated: 50 mL/min — ABNORMAL LOW (ref >60)
Glucose, Bld: 147 mg/dL — ABNORMAL HIGH (ref 70–99)
Glucose, Bld: 109 mg/dL — ABNORMAL HIGH (ref 70–99)
Potassium: 5.3 mmol/L — ABNORMAL HIGH (ref 3.5–5.1)
Potassium: 3.9 mmol/L (ref 3.5–5.1)
Sodium: 136 mmol/L (ref 135–145)
Sodium: 138 mmol/L (ref 135–145)
Total Bilirubin: 0.5 mg/dL (ref 0.0–1.2)
Total Bilirubin: 1 mg/dL (ref 0.0–1.2)
Total Protein: 5.9 g/dL — ABNORMAL LOW (ref 6.5–8.1)
Total Protein: 6.6 g/dL (ref 6.5–8.1)

## 2023-07-14 LAB — I-STAT VENOUS BLOOD GAS, ED
Acid-base deficit: 2 mmol/L (ref 0.0–2.0)
Bicarbonate: 23.3 mmol/L (ref 20.0–28.0)
Calcium, Ion: 1.22 mmol/L (ref 1.15–1.40)
HCT: 36 % — ABNORMAL LOW (ref 39.0–52.0)
Hemoglobin: 12.2 g/dL — ABNORMAL LOW (ref 13.0–17.0)
O2 Saturation: 93 %
Potassium: 3.9 mmol/L (ref 3.5–5.1)
Sodium: 141 mmol/L (ref 135–145)
TCO2: 24 mmol/L (ref 22–32)
pCO2, Ven: 40.6 mmHg — ABNORMAL LOW (ref 44–60)
pH, Ven: 7.366 (ref 7.25–7.43)
pO2, Ven: 69 mmHg — ABNORMAL HIGH (ref 32–45)

## 2023-07-14 LAB — BASIC METABOLIC PANEL
Anion gap: 9 (ref 5–15)
BUN: 34 mg/dL — ABNORMAL HIGH (ref 6–20)
CO2: 21 mmol/L — ABNORMAL LOW (ref 22–32)
Calcium: 9 mg/dL (ref 8.9–10.3)
Chloride: 108 mmol/L (ref 98–111)
Creatinine, Ser: 1.55 mg/dL — ABNORMAL HIGH (ref 0.61–1.24)
GFR, Estimated: 54 mL/min — ABNORMAL LOW (ref >60)
Glucose, Bld: 104 mg/dL — ABNORMAL HIGH (ref 70–99)
Potassium: 3.9 mmol/L (ref 3.5–5.1)
Sodium: 138 mmol/L (ref 135–145)

## 2023-07-14 LAB — CBC
HCT: 36.4 % — ABNORMAL LOW (ref 39.0–52.0)
HCT: 38.7 % — ABNORMAL LOW (ref 39.0–52.0)
Hemoglobin: 11.9 g/dL — ABNORMAL LOW (ref 13.0–17.0)
Hemoglobin: 13.1 g/dL (ref 13.0–17.0)
MCH: 33.7 pg (ref 26.0–34.0)
MCH: 34.2 pg — ABNORMAL HIGH (ref 26.0–34.0)
MCHC: 32.7 g/dL (ref 30.0–36.0)
MCHC: 33.9 g/dL (ref 30.0–36.0)
MCV: 103.1 fL — ABNORMAL HIGH (ref 80.0–100.0)
MCV: 101 fL — ABNORMAL HIGH (ref 80.0–100.0)
Platelets: 119 10*3/uL — ABNORMAL LOW (ref 150–400)
Platelets: 133 10*3/uL — ABNORMAL LOW (ref 150–400)
RBC: 3.53 MIL/uL — ABNORMAL LOW (ref 4.22–5.81)
RBC: 3.83 MIL/uL — ABNORMAL LOW (ref 4.22–5.81)
RDW: 18.2 % — ABNORMAL HIGH (ref 11.5–15.5)
RDW: 17.8 % — ABNORMAL HIGH (ref 11.5–15.5)
WBC: 4.1 10*3/uL (ref 4.0–10.5)
WBC: 3.2 10*3/uL — ABNORMAL LOW (ref 4.0–10.5)
nRBC: 0 % (ref 0.0–0.2)
nRBC: 0 % (ref 0.0–0.2)

## 2023-07-14 LAB — CBC WITH DIFFERENTIAL/PLATELET
Abs Immature Granulocytes: 0.01 10*3/uL (ref 0.00–0.07)
Basophils Absolute: 0 10*3/uL (ref 0.0–0.1)
Basophils Relative: 0 %
Eosinophils Absolute: 0.1 10*3/uL (ref 0.0–0.5)
Eosinophils Relative: 1 %
HCT: 34.8 % — ABNORMAL LOW (ref 39.0–52.0)
Hemoglobin: 11.6 g/dL — ABNORMAL LOW (ref 13.0–17.0)
Immature Granulocytes: 0 %
Lymphocytes Relative: 24 %
Lymphs Abs: 0.9 10*3/uL (ref 0.7–4.0)
MCH: 34.4 pg — ABNORMAL HIGH (ref 26.0–34.0)
MCHC: 33.3 g/dL (ref 30.0–36.0)
MCV: 103.3 fL — ABNORMAL HIGH (ref 80.0–100.0)
Monocytes Absolute: 0.7 10*3/uL (ref 0.1–1.0)
Monocytes Relative: 18 %
Neutro Abs: 2.2 10*3/uL (ref 1.7–7.7)
Neutrophils Relative %: 57 %
Platelets: 128 10*3/uL — ABNORMAL LOW (ref 150–400)
RBC: 3.37 MIL/uL — ABNORMAL LOW (ref 4.22–5.81)
RDW: 18.4 % — ABNORMAL HIGH (ref 11.5–15.5)
WBC: 3.9 10*3/uL — ABNORMAL LOW (ref 4.0–10.5)
nRBC: 0 % (ref 0.0–0.2)

## 2023-07-14 LAB — TSH: TSH: 0.608 u[IU]/mL (ref 0.350–4.500)

## 2023-07-14 LAB — TROPONIN I (HIGH SENSITIVITY)
Troponin I (High Sensitivity): 80 ng/L — ABNORMAL HIGH (ref <18)
Troponin I (High Sensitivity): 61 ng/L — ABNORMAL HIGH (ref <18)

## 2023-07-14 LAB — RAPID URINE DRUG SCREEN, HOSP PERFORMED
Amphetamines: NOT DETECTED
Barbiturates: NOT DETECTED
Benzodiazepines: NOT DETECTED
Cocaine: POSITIVE — AB
Opiates: NOT DETECTED
Tetrahydrocannabinol: NOT DETECTED

## 2023-07-14 LAB — CBG MONITORING, ED: Glucose-Capillary: 134 mg/dL — ABNORMAL HIGH (ref 70–99)

## 2023-07-14 LAB — URINALYSIS, ROUTINE W REFLEX MICROSCOPIC
Bilirubin Urine: NEGATIVE
Glucose, UA: NEGATIVE mg/dL
Hgb urine dipstick: NEGATIVE
Ketones, ur: NEGATIVE mg/dL
Leukocytes,Ua: NEGATIVE
Nitrite: NEGATIVE
Protein, ur: NEGATIVE mg/dL
Specific Gravity, Urine: 1.005 (ref 1.005–1.030)
pH: 5 (ref 5.0–8.0)

## 2023-07-14 LAB — RESP PANEL BY RT-PCR (RSV, FLU A&B, COVID)  RVPGX2
Influenza A by PCR: NEGATIVE
Influenza B by PCR: NEGATIVE
Resp Syncytial Virus by PCR: NEGATIVE
SARS Coronavirus 2 by RT PCR: NEGATIVE

## 2023-07-14 LAB — ACETAMINOPHEN LEVEL: Acetaminophen (Tylenol), Serum: <10 ug/mL — ABNORMAL LOW (ref 10–30)

## 2023-07-14 LAB — SALICYLATE LEVEL: Salicylate Lvl: <7 mg/dL — ABNORMAL LOW (ref 7.0–30.0)

## 2023-07-14 LAB — AMMONIA: Ammonia: 56 umol/L — ABNORMAL HIGH (ref 9–35)

## 2023-07-14 LAB — BRAIN NATRIURETIC PEPTIDE: B Natriuretic Peptide: 1248.3 pg/mL — ABNORMAL HIGH (ref 0.0–100.0)

## 2023-07-14 LAB — ETHANOL: Alcohol, Ethyl (B): <10 mg/dL (ref <10)

## 2023-07-14 LAB — CK: Total CK: 699 U/L — ABNORMAL HIGH (ref 49–397)

## 2023-07-14 MED ORDER — ACETAMINOPHEN 650 MG RE SUPP: 650.0000 mg | Freq: Four times a day (QID) | RECTAL | Status: DC | PRN

## 2023-07-14 MED ORDER — ACETAMINOPHEN 325 MG PO TABS: 650.0000 mg | ORAL_TABLET | Freq: Four times a day (QID) | ORAL | Status: DC | PRN

## 2023-07-14 MED ORDER — PROCHLORPERAZINE EDISYLATE 10 MG/2ML IJ SOLN
10.0000 mg | Freq: Four times a day (QID) | INTRAMUSCULAR | Status: DC | PRN
  Administered 2023-07-14: 10 mg via INTRAVENOUS
  Filled 2023-07-14: qty 2

## 2023-07-14 MED ORDER — NALOXONE HCL 0.4 MG/ML IJ SOLN
0.4000 mg | Freq: Once | INTRAMUSCULAR | Status: AC
  Administered 2023-07-14: 0.4 mg via INTRAVENOUS
  Filled 2023-07-14: qty 1

## 2023-07-14 MED ORDER — INSULIN ASPART 100 UNIT/ML IJ SOLN: 2.0000 [IU] | Freq: Once | INTRAMUSCULAR | Status: DC

## 2023-07-14 MED ORDER — NALOXONE HCL 0.4 MG/ML IJ SOLN: 0.4000 mg | INTRAMUSCULAR | Status: DC | PRN

## 2023-07-14 MED ORDER — LOSARTAN POTASSIUM 25 MG PO TABS
25.0000 mg | ORAL_TABLET | Freq: Every day | ORAL | Status: DC
  Administered 2023-07-14: 25 mg via ORAL
  Filled 2023-07-14 (×2): qty 1

## 2023-07-14 MED ORDER — FUROSEMIDE 10 MG/ML IJ SOLN
20.0000 mg | Freq: Two times a day (BID) | INTRAMUSCULAR | Status: DC
  Administered 2023-07-14 (×2): 20 mg via INTRAVENOUS
  Filled 2023-07-14 (×3): qty 2

## 2023-07-14 MED ORDER — SODIUM CHLORIDE 0.9 % IV SOLN: 250.0000 mL | INTRAVENOUS | Status: AC | PRN

## 2023-07-14 MED ORDER — SODIUM CHLORIDE 0.9% FLUSH
3.0000 mL | Freq: Two times a day (BID) | INTRAVENOUS | Status: DC
  Administered 2023-07-14 (×2): 3 mL via INTRAVENOUS

## 2023-07-14 MED ORDER — TORSEMIDE 100 MG PO TABS: 100.0000 mg | ORAL_TABLET | Freq: Every day | ORAL | Status: DC

## 2023-07-14 MED ORDER — DEXTROSE 50 % IV SOLN
1.0000 | Freq: Once | INTRAVENOUS | Status: DC
  Filled 2023-07-14: qty 50

## 2023-07-14 MED ORDER — SODIUM CHLORIDE 0.9% FLUSH: 3.0000 mL | INTRAVENOUS | Status: DC | PRN

## 2023-07-14 MED ORDER — CALCIUM GLUCONATE-NACL 1-0.675 GM/50ML-% IV SOLN
1.0000 g | Freq: Once | INTRAVENOUS | Status: DC
  Filled 2023-07-14: qty 50

## 2023-07-14 MED ORDER — ALBUTEROL SULFATE (2.5 MG/3ML) 0.083% IN NEBU: 2.5000 mg | INHALATION_SOLUTION | RESPIRATORY_TRACT | Status: DC | PRN

## 2023-07-14 MED ORDER — AMLODIPINE BESYLATE 10 MG PO TABS
10.0000 mg | ORAL_TABLET | Freq: Every day | ORAL | Status: DC
  Administered 2023-07-14: 10 mg via ORAL
  Filled 2023-07-14 (×2): qty 1

## 2023-07-14 MED ORDER — SPIRONOLACTONE 25 MG PO TABS
25.0000 mg | ORAL_TABLET | Freq: Every day | ORAL | Status: DC
  Filled 2023-07-14: qty 1

## 2023-07-14 NOTE — ED Notes (Signed)
 Pt refused assessment stating " I don't want to talk! Too many questions! I need to rest!"

## 2023-07-14 NOTE — Progress Notes (Signed)
 Hyperkalemia-secondary to rhabdomyolysis Elevated potassium 5.3.  Giving insulin, dextrose and IV calcium gluconate.  Unable to give oral Lokelma given patient is drowsy/sleepy.
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
## 2023-07-14 NOTE — Progress Notes (Signed)
  Same day note  Charles [name]. is a 53 y.o. male with medical history significant of chronic systolic and diastolic heart failure reduced EF 30 to 35%, essential hypertension, CKD stage IIIb and chronic cocaine use presented to hospital with altered mental status, not answering appropriately, generalized body ache.  Patient is homeless and continues to use cocaine and was discharged from the hospital 07/07/2023 after management of acute on chronic heart failure.  Patient is noncompliant to medication.  In the ED CBC was unremarkable.  Patient had normal renal function.  Troponin was elevated at 80 baseline around 81-1 01.  EKG showed normal sinus rhythm with PAC.  Chest x-ray with cardiomegaly megaly and mild to moderate severe pulmonary vascular congestion.  CT head was negative for acute findings.  Patient was drowsy in the ED in the setting of cocaine abuse hospitalist team was consulted for management of altered mental status. Patient seen and examined at bedside.  Patient was admitted to the hospital for generalized body ache, sleepiness.  At the time of my evaluation, patient complains of generalized body ache.  Wants to eat.  Denies dyspnea.  Physical examination reveals obese built male, slightly fidgety, trying to get out of the bed.  States that he feels hungry and wants to eat.  Laboratory data and imaging was reviewed  Assessment and Plan:  Acute metabolic encephalopathy-secondary to cocaine use Generalized body ache likely secondary to cocaine use  Urine drug screen positive for cocaine.  Urinalysis was negative for infection.  CK level slightly elevated at 699.  TSH within normal range.  Ammonia slightly elevated at 56 and AST slightly elevated at 69..  Tylenol and alcohol levels less than 10 including salicylate level less than 10.  COVID influenza and RSV was negative. Concern for altered mental status in the setting of cocaine use.     Chronic systolic and diastolic heart  reduced EF 30 to 35%-medication noncompliance Recent admission a week back.  Chest x-ray with vascular congestion.  Continue IV Lasix twice daily. Once patient will be more alert oriented will resume oral GDMT guided medications.  Hold any beta-blocker in the setting of cocaine use.  Consult sent.   Elevated troponin secondary to demand ischemia in setting of cocaine use Baseline troponin between 63-101.  Chronic troponin elevation noted.  Generalized body ache CK slightly elevated.  IV fluids on hold due to congestion and low EF.  Encouraged oral hydration.   History of prolonged QT interval Will continue to monitor electrolytes.   CKD stage IIIb Creatinine 1.5 and at baseline at this time.   Chronic macrocytic anemia Anemia of chronic disease  Stable hemoglobin at this time.  No Charge  Signed,  Tenny Craw, MD [facility]

## 2023-07-14 NOTE — ED Triage Notes (Signed)
 Pt is here with c/o body aches , is also homeless and says he has been doing some cocaine

## 2023-07-14 NOTE — ED Notes (Signed)
 Pt to xray

## 2023-07-14 NOTE — Hospital Course (Signed)
 Charles [name]. is a 53 y.o. male with medical history significant of chronic systolic and diastolic heart failure reduced EF 30 to 35%, essential hypertension, CKD stage IIIb and chronic cocaine use presented to hospital with altered mental status, not answering appropriately, generalized body ache.  Patient is homeless and continues to use cocaine and was discharged from the hospital 07/07/2023 after management of acute on chronic heart failure.  Patient is noncompliant to medication.  In the ED CBC was unremarkable.  Patient had normal renal function.  Troponin was elevated at 80 baseline around 81-1 01.  EKG showed normal sinus rhythm with PAC.  Chest x-ray with cardiomegaly megaly and mild to moderate severe pulmonary vascular congestion.  CT head was negative for acute findings.  Patient was drowsy in the ED in the setting of cocaine abuse hospitalist team was consulted for management of altered mental status.  Assessment and Plan:  Acute metabolic encephalopathy-secondary to cocaine use Generalized body ache likely secondary to cocaine use  Urine drug screen positive for cocaine.  Urinalysis was negative for infection.  CK level slightly elevated at 699.  TSH within normal range.  Ammonia slightly elevated at 56 and AST slightly elevated at 69..  Tylenol and alcohol levels less than 10 including salicylate level less than 10.  COVID influenza and RSV was negative. Concern for altered mental status in the setting of cocaine use.     Chronic systolic and diastolic heart reduced EF 30 to 35%-medication noncompliance Recent admission a week back.  Chest x-ray with vascular congestion.  Continue IV Lasix twice daily. Once patient will be more alert oriented will resume oral GDMT guided medications.  Hold any beta-blocker in the setting of cocaine use.    Elevated troponin secondary to demand ischemia in setting of cocaine use Baseline enroute 63-1 01.  Chronic troponin patient  noted.  Generalized body ache CK slightly elevated.  IV fluids on hold due to congestion and low EF.   History of prolonged QT interval Electrolytes.   CKD stage IIIb Creatinine 1.5 and at baseline at this time.   Chronic macrocytic anemia Anemia of chronic disease  Stable hemoglobin at this time.

## 2023-07-14 NOTE — ED Provider Notes (Signed)
 [place] EMERGENCY DEPARTMENT AT [facility] Provider Note   CSN: [id number] Arrival date & time: 07/14/23  0006     History  Chief Complaint  Patient presents with   Generalized Body Aches    Charles [name]. is a 53 y.o. male.  Level 5 caveat for altered mental status.  Patient falling asleep and will not stay awake to answer questions.  He apparently called EMS with bodyaches all over.  He is homeless and using cocaine.  States he has not had any of his medications since he was discharged from the hospital on February 24.  He complains of pain all over including his abdomen, chest, head, neck and back.  He will not stay awake to answer questions.  No cough or fever.  Unable to give a history.  Does have a history of hypertension, CHF with EF 30-35%, polysubstance abuse, sleep apnea, arthritis  The history is provided by the patient.       Home Medications Prior to Admission medications   Medication Sig Start Date End Date Taking? Authorizing Provider  albuterol (VENTOLIN HFA) 108 (90 Base) MCG/ACT inhaler Inhale 2 puffs into the lungs every 4 (four) hours as needed for wheezing or shortness of breath. 06/11/23   Dorcas Carrow, MD  empagliflozin (JARDIANCE) 10 MG TABS tablet Take 1 tablet (10 mg total) by mouth daily. 07/07/23   Arrien, York Ram, MD  furosemide (LASIX) 40 MG tablet Take 1 tablet (40 mg total) by mouth daily. 07/07/23   Arrien, York Ram, MD  gabapentin (NEURONTIN) 100 MG capsule Take 1 capsule (100 mg total) by mouth 2 (two) times daily. 07/07/23 10/05/23  Arrien, York Ram, MD  losartan (COZAAR) 25 MG tablet Take 1 tablet (25 mg total) by mouth daily. 07/07/23   Arrien, York Ram, MD  spironolactone (ALDACTONE) 25 MG tablet Take 1 tablet (25 mg total) by mouth daily. 07/07/23 08/06/23  Arrien, York Ram, MD      Allergies    Banana, Egg-derived products, and Pork-derived products    Review of Systems   Review of  Systems  Unable to perform ROS: Mental status change  Constitutional:  Negative for activity change, appetite change and fever.    Physical Exam Updated Vital Signs BP (!) 157/99   Pulse 90   Temp 98 F (36.7 C) (Temporal)   Resp 18   SpO2 98%  Physical Exam Vitals and nursing note reviewed.  Constitutional:      General: He is not in acute distress.    Appearance: He is well-developed.     Comments: Somnolent, arouses to voice but quickly falls back asleep. Oriented to person and place  HENT:     Head: Normocephalic and atraumatic.     Mouth/Throat:     Pharynx: No oropharyngeal exudate.  Eyes:     Conjunctiva/sclera: Conjunctivae normal.     Pupils: Pupils are equal, round, and reactive to light.  Neck:     Comments: No meningismus. Cardiovascular:     Rate and Rhythm: Normal rate and regular rhythm.     Heart sounds: Normal heart sounds. No murmur heard. Pulmonary:     Effort: Pulmonary effort is normal. No respiratory distress.     Breath sounds: Rhonchi present.  Abdominal:     Palpations: Abdomen is soft.     Tenderness: There is no abdominal tenderness. There is no guarding or rebound.  Musculoskeletal:        General: No tenderness. Normal range  of motion.     Cervical back: Normal range of motion and neck supple.  Skin:    General: Skin is warm.  Neurological:     Mental Status: He is alert.     Cranial Nerves: No cranial nerve deficit.     Motor: No abnormal muscle tone.     Coordination: Coordination normal.     Comments: Falls asleep, not cooperate for formal neurological testing.  Moves all extremities equally.  Psychiatric:        Behavior: Behavior normal.     ED Results / Procedures / Treatments   Labs (all labs ordered are listed, but only abnormal results are displayed) Labs Reviewed  CBC WITH DIFFERENTIAL/PLATELET - Abnormal; Notable for the following components:      Result Value   WBC 3.9 (*)    RBC 3.37 (*)    Hemoglobin 11.6 (*)     HCT 34.8 (*)    MCV 103.3 (*)    MCH 34.4 (*)    RDW 18.4 (*)    Platelets 128 (*)    All other components within normal limits  BASIC METABOLIC PANEL - Abnormal; Notable for the following components:   CO2 21 (*)    Glucose, Bld 104 (*)    BUN 34 (*)    Creatinine, Ser 1.55 (*)    GFR, Estimated 54 (*)    All other components within normal limits  BRAIN NATRIURETIC PEPTIDE - Abnormal; Notable for the following components:   B Natriuretic Peptide 1,248.3 (*)    All other components within normal limits  CK - Abnormal; Notable for the following components:   Total CK 699 (*)    All other components within normal limits  AMMONIA - Abnormal; Notable for the following components:   Ammonia 56 (*)    All other components within normal limits  ACETAMINOPHEN LEVEL - Abnormal; Notable for the following components:   Acetaminophen (Tylenol), Serum <10 (*)    All other components within normal limits  SALICYLATE LEVEL - Abnormal; Notable for the following components:   Salicylate Lvl <7.0 (*)    All other components within normal limits  COMPREHENSIVE METABOLIC PANEL - Abnormal; Notable for the following components:   Potassium 5.3 (*)    CO2 21 (*)    Glucose, Bld 147 (*)    BUN 31 (*)    Creatinine, Ser 1.54 (*)    Calcium 8.6 (*)    Total Protein 5.9 (*)    Albumin 3.0 (*)    AST 69 (*)    GFR, Estimated 54 (*)    All other components within normal limits  I-STAT VENOUS BLOOD GAS, ED - Abnormal; Notable for the following components:   pCO2, Ven 40.6 (*)    pO2, Ven 69 (*)    HCT 36.0 (*)    Hemoglobin 12.2 (*)    All other components within normal limits  TROPONIN I (HIGH SENSITIVITY) - Abnormal; Notable for the following components:   Troponin I (High Sensitivity) 80 (*)    All other components within normal limits  TROPONIN I (HIGH SENSITIVITY) - Abnormal; Notable for the following components:   Troponin I (High Sensitivity) 61 (*)    All other components within normal  limits  RESP PANEL BY RT-PCR (RSV, FLU A&B, COVID)  RVPGX2  ETHANOL  TSH  URINALYSIS, ROUTINE W REFLEX MICROSCOPIC  RAPID URINE DRUG SCREEN, HOSP PERFORMED  CBC    EKG EKG Interpretation Date/Time:  Monday July 14 2023  02:13:18 EST Ventricular Rate:  90 PR Interval:  174 QRS Duration:  100 QT Interval:  392 QTC Calculation: 479 R Axis:   81  Text Interpretation: Normal sinus rhythm Possible Left atrial enlargement Left ventricular hypertrophy with repolarization abnormality ( Sokolow-Lyon , Cornell product , Romhilt-Estes ) Abnormal ECG When compared with ECG of 03-Jul-2023 09:25, PREVIOUS ECG IS PRESENT Artifact Confirmed by Glynn Octave [phone]) on 07/14/2023 2:17:22 AM  Radiology CT Head Wo Contrast Result Date: 07/14/2023 CLINICAL DATA:  Generalized body aches. EXAM: CT HEAD WITHOUT CONTRAST TECHNIQUE: Contiguous axial images were obtained from the base of the skull through the vertex without intravenous contrast. RADIATION DOSE REDUCTION: This exam was performed according to the departmental dose-optimization program which includes automated exposure control, adjustment of the mA and/or kV according to patient size and/or use of iterative reconstruction technique. COMPARISON:  June 09, 2023 FINDINGS: Brain: No evidence of acute infarction, hemorrhage, hydrocephalus, extra-axial collection or mass lesion/mass effect. Vascular: No hyperdense vessel or unexpected calcification. Skull: Normal. Negative for fracture or focal lesion. Sinuses/Orbits: A chronic fracture deformity is seen involving the lamina papyracea on the left. Other: None. IMPRESSION: No acute intracranial abnormality. Electronically Signed   By: Aram Candela M.D.   On: 07/14/2023 01:30   DG Chest 2 View Result Date: 07/14/2023 CLINICAL DATA:  Body aches. EXAM: CHEST - 2 VIEW COMPARISON:  July 03, 2023 FINDINGS: There is stable mild to moderate severity cardiac silhouette enlargement. Mild to moderate severity  prominence of the central pulmonary vasculature is noted. This is mildly increased in severity when compared to the prior study. There is no evidence of an acute infiltrate, pleural effusion or pneumothorax. The visualized skeletal structures are unremarkable. IMPRESSION: Stable cardiomegaly with mild to moderate severity central pulmonary vascular congestion. Electronically Signed   By: Aram Candela M.D.   On: 07/14/2023 00:54    Procedures Procedures    Medications Ordered in ED Medications - No data to display  ED Course/ Medical Decision Making/ A&P                                 Medical Decision Making Amount and/or Complexity of Data Reviewed Labs: ordered. Decision-making details documented in ED Course. Radiology: ordered and independent interpretation performed. Decision-making details documented in ED Course. ECG/medicine tests: ordered and independent interpretation performed. Decision-making details documented in ED Course.  Risk Prescription drug management. Decision regarding hospitalization.   Altered mental status.  Patient apparently called EMS for aches all over.  Stable vital signs.  admits to cocaine use. Patient admits to not taking of his medications since DC from the hospital February 24.  EKG shows sinus rhythm with LVH and artifact in V3 with some ST elevation.  Will repeat.  Somnolent but protecting airway.  He is given Narcan without effect.  CT head is negative for acute ischemic finding or hemorrhage. Chest x-ray shows vascular congestion similar to previous.  Mentation improving. But no true response to narcan. Ethanol level undetectable.  Minimally elevated but flat.  Complains of pain all over.  ABG without significant CO2 retention. Troponin seems to be at his baseline.  Does have evidence of mild CHF which appears to be chronic.  Labs otherwise unrevealing of source of altered mental status.  Ammonia mildly elevated at 56.  CK elevated  slightly.   Metabolic encephalopathy likely secondary to substance abuse.  CT head is stable.  No significant CO2  retention.  Protecting airway.  Will give IV Lasix for suspected likely CHF exacerbation.  Remains somnolent and confused likely in the setting of polysubstance abuse.  Admission discussed with Dr. Janalyn Shy.        Final Clinical Impression(s) / ED Diagnoses Final diagnoses:  None    Rx / DC Orders ED Discharge Orders     None         Kennie Snedden, Jeannett Senior, MD 07/14/23 [phone]

## 2023-07-15 ENCOUNTER — Other Ambulatory Visit (HOSPITAL_COMMUNITY): Payer: Self-pay

## 2023-07-15 ENCOUNTER — Emergency Department (HOSPITAL_COMMUNITY)
Admission: EM | Admit: 2023-07-15 | Discharge: 2023-07-16 | Disposition: A | Discharge status: Home / Self Care | Source: Home / Self Care | Attending: Emergency Medicine | Admitting: Emergency Medicine

## 2023-07-15 ENCOUNTER — Encounter (HOSPITAL_COMMUNITY): Payer: Self-pay

## 2023-07-15 DIAGNOSIS — M7989 Other specified soft tissue disorders: Secondary | ICD-10-CM | POA: Diagnosis not present

## 2023-07-15 DIAGNOSIS — R2232 Localized swelling, mass and lump, left upper limb: Secondary | ICD-10-CM | POA: Diagnosis not present

## 2023-07-15 DIAGNOSIS — R2231 Localized swelling, mass and lump, right upper limb: Secondary | ICD-10-CM | POA: Insufficient documentation

## 2023-07-15 DIAGNOSIS — G9341 Metabolic encephalopathy: Secondary | ICD-10-CM | POA: Diagnosis not present

## 2023-07-15 LAB — CBC WITH DIFFERENTIAL/PLATELET
Abs Immature Granulocytes: 0 10*3/uL (ref 0.00–0.07)
Basophils Absolute: 0 10*3/uL (ref 0.0–0.1)
Basophils Relative: 0 %
Eosinophils Absolute: 0.1 10*3/uL (ref 0.0–0.5)
Eosinophils Relative: 2 %
HCT: 40.2 % (ref 39.0–52.0)
Hemoglobin: 13.3 g/dL (ref 13.0–17.0)
Immature Granulocytes: 0 %
Lymphocytes Relative: 38 %
Lymphs Abs: 1.1 10*3/uL (ref 0.7–4.0)
MCH: 33.6 pg (ref 26.0–34.0)
MCHC: 33.1 g/dL (ref 30.0–36.0)
MCV: 101.5 fL — ABNORMAL HIGH (ref 80.0–100.0)
Monocytes Absolute: 0.5 10*3/uL (ref 0.1–1.0)
Monocytes Relative: 18 %
Neutro Abs: 1.2 10*3/uL — ABNORMAL LOW (ref 1.7–7.7)
Neutrophils Relative %: 42 %
Platelets: 154 10*3/uL (ref 150–400)
RBC: 3.96 MIL/uL — ABNORMAL LOW (ref 4.22–5.81)
RDW: 18 % — ABNORMAL HIGH (ref 11.5–15.5)
WBC: 2.8 10*3/uL — ABNORMAL LOW (ref 4.0–10.5)
nRBC: 0 % (ref 0.0–0.2)

## 2023-07-15 LAB — BASIC METABOLIC PANEL
Anion gap: 8 (ref 5–15)
Anion gap: 7 (ref 5–15)
BUN: 32 mg/dL — ABNORMAL HIGH (ref 6–20)
BUN: 25 mg/dL — ABNORMAL HIGH (ref 6–20)
CO2: 25 mmol/L (ref 22–32)
CO2: 22 mmol/L (ref 22–32)
Calcium: 8.9 mg/dL (ref 8.9–10.3)
Calcium: 8.9 mg/dL (ref 8.9–10.3)
Chloride: 104 mmol/L (ref 98–111)
Chloride: 107 mmol/L (ref 98–111)
Creatinine, Ser: 1.32 mg/dL — ABNORMAL HIGH (ref 0.61–1.24)
Creatinine, Ser: 1.3 mg/dL — ABNORMAL HIGH (ref 0.61–1.24)
GFR, Estimated: >60 mL/min (ref >60)
GFR, Estimated: >60 mL/min (ref >60)
Glucose, Bld: 154 mg/dL — ABNORMAL HIGH (ref 70–99)
Glucose, Bld: 156 mg/dL — ABNORMAL HIGH (ref 70–99)
Potassium: 3.8 mmol/L (ref 3.5–5.1)
Potassium: 4.1 mmol/L (ref 3.5–5.1)
Sodium: 137 mmol/L (ref 135–145)
Sodium: 136 mmol/L (ref 135–145)

## 2023-07-15 LAB — CBC
HCT: 38.1 % — ABNORMAL LOW (ref 39.0–52.0)
Hemoglobin: 12.7 g/dL — ABNORMAL LOW (ref 13.0–17.0)
MCH: 33.6 pg (ref 26.0–34.0)
MCHC: 33.3 g/dL (ref 30.0–36.0)
MCV: 100.8 fL — ABNORMAL HIGH (ref 80.0–100.0)
Platelets: 137 10*3/uL — ABNORMAL LOW (ref 150–400)
RBC: 3.78 MIL/uL — ABNORMAL LOW (ref 4.22–5.81)
RDW: 17.6 % — ABNORMAL HIGH (ref 11.5–15.5)
WBC: 3 10*3/uL — ABNORMAL LOW (ref 4.0–10.5)
nRBC: 0 % (ref 0.0–0.2)

## 2023-07-15 LAB — BRAIN NATRIURETIC PEPTIDE: B Natriuretic Peptide: 336.1 pg/mL — ABNORMAL HIGH (ref 0.0–100.0)

## 2023-07-15 LAB — MAGNESIUM
Magnesium: 1.8 mg/dL (ref 1.7–2.4)
Magnesium: 1.9 mg/dL (ref 1.7–2.4)

## 2023-07-15 MED ORDER — GABAPENTIN 100 MG PO CAPS
100.0000 mg | ORAL_CAPSULE | Freq: Two times a day (BID) | ORAL | 0 refills | Status: DC
  Filled 2023-07-15 (×2): qty 180, 90d supply, fill #0

## 2023-07-15 MED ORDER — LOSARTAN POTASSIUM 25 MG PO TABS
25.0000 mg | ORAL_TABLET | Freq: Every day | ORAL | 0 refills | Status: DC
  Filled 2023-07-15 (×2): qty 30, 30d supply, fill #0

## 2023-07-15 MED ORDER — EMPAGLIFLOZIN 10 MG PO TABS
10.0000 mg | ORAL_TABLET | Freq: Every day | ORAL | 0 refills | Status: DC
  Filled 2023-07-15 (×2): qty 30, 30d supply, fill #0

## 2023-07-15 MED ORDER — ALBUTEROL SULFATE HFA 108 (90 BASE) MCG/ACT IN AERS
2.0000 | INHALATION_SPRAY | RESPIRATORY_TRACT | 2 refills | Status: DC | PRN
  Filled 2023-07-15: qty 6.7, 17d supply, fill #0

## 2023-07-15 MED ORDER — SPIRONOLACTONE 25 MG PO TABS
25.0000 mg | ORAL_TABLET | Freq: Every day | ORAL | 0 refills | Status: DC
  Filled 2023-07-15: qty 30, 30d supply, fill #0

## 2023-07-15 MED ORDER — FUROSEMIDE 40 MG PO TABS
40.0000 mg | ORAL_TABLET | Freq: Every day | ORAL | 0 refills | Status: DC
  Filled 2023-07-15 (×2): qty 30, 30d supply, fill #0

## 2023-07-15 MED ORDER — AMLODIPINE BESYLATE 10 MG PO TABS
10.0000 mg | ORAL_TABLET | Freq: Every day | ORAL | 2 refills | Status: DC
  Filled 2023-07-15: qty 30, 30d supply, fill #0

## 2023-07-15 NOTE — ED Triage Notes (Signed)
 Pt is coming in post discharge from hospital for swelling in his hands and feet. He reports is is painful and he has not experienced anything like this before. He expresses no other complaints at this time. The hands and feet/ankles do appear swollen at this time. Not a dialysis patient, but does have Hx of heart issues/heart failure.

## 2023-07-15 NOTE — Plan of Care (Signed)

## 2023-07-15 NOTE — Discharge Summary (Signed)
 Physician Discharge Summary  Charles [name]. JYN:[id number] DOB: 1971/02/25 DOA: 07/14/2023  PCP: Renaye Rakers, MD  Admit date: 07/14/2023 Discharge date: 07/15/2023  Admitted From: Home  Discharge disposition: Home   Recommendations for Outpatient Follow-Up:   Follow up with your primary care provider in one week.  Check CBC, BMP, magnesium in the next visit Patient should be emphasized on medication compliance, counseling on illicit substance usage, fluid restriction.   Discharge Diagnosis:   Principal Problem:   Acute metabolic encephalopathy Active Problems:   Chronic diastolic CHF (congestive heart failure) (HCC)   Elevated troponin   Essential hypertension   Prolonged QT interval   CKD stage 3b, GFR 30-44 ml/min (HCC)   Macrocytic anemia   Rhabdomyolysis   Altered mental status, unspecified   Discharge Condition: Improved.  Diet recommendation:  Regular.  Wound care: None.  Code status: Full.   History of Present Illness:   Charles [name]. is a 53 y.o. male with medical history significant of chronic systolic and diastolic heart failure reduced EF 30 to 35%, essential hypertension, CKD stage IIIb and chronic cocaine use presented to hospital with altered mental status, not answering appropriately, generalized body ache.  Patient is homeless and continues to use cocaine and was discharged from the hospital 07/07/2023 after management of acute on chronic heart failure.  Patient is noncompliant to medication.  In the ED CBC was unremarkable.  Patient had normal renal function.  Troponin was elevated at 80 baseline around 81-1 01.  EKG showed normal sinus rhythm with PAC.  Chest x-ray with cardiomegaly megaly and mild to moderate severe pulmonary vascular congestion.  CT head was negative for acute findings.  Patient was drowsy in the ED in the setting of cocaine abuse hospitalist team was consulted for management of altered mental status.   Hospital Course:    Following conditions were addressed during hospitalization as listed below,  Acute metabolic encephalopathy-secondary to cocaine use Generalized body ache likely secondary to cocaine use   Resolved at this time.  Urine drug screen positive for cocaine.  Urinalysis was negative for infection.  CK level slightly elevated at 699.  TSH within normal range.  Ammonia slightly elevated at 56 and AST slightly elevated at 69..  Tylenol and alcohol levels less than 10 including salicylate level less than 10.  COVID influenza and RSV was negative.  Counseling done regarding cocaine abuse.   Chronic systolic and diastolic heart reduced EF 30 to 35%-medication noncompliance Recent admission a week back.  Chest x-ray with vascular congestion.  Have sent all the medications to [facility] pharmacy again today advised to remain compliant with fluid dietary and diuretics.    Elevated troponin secondary to demand ischemia in setting of cocaine use Baseline troponin between 63-101.  Chronic troponin elevation noted.   Generalized body ache CK slightly elevated.  IV fluids on hold due to congestion and low EF.  Encouraged oral hydration.   Medication noncompliance.  Due to drug abuse.  Was recently in the hospital but did not feel any medication.  Have sent all the medications to [facility] pharmacy again today.  3 high risk of readmission secondary to this.  CKD stage IIIb Creatinine 1.3 and at baseline at this time.   Chronic macrocytic anemia Anemia of chronic disease  Stable hemoglobin at this time.  Recommend outpatient follow-up.  Disposition.  At this time, patient is stable for disposition home with outpatient PCP and cardiology follow-up.  Medical Consultants:   None.  Procedures:    None Subjective:   Today, patient was seen and examined at bedside.  Denies any nausea vomiting shortness of breath but complains of generalized body ache.  Sleeping comfortably in bed  Discharge Exam:   Vitals:    07/15/23 0551 07/15/23 0715  BP: (!) 140/94 (!) 139/94  Pulse: 97 89  Resp: 20 17  Temp: 98.4 F (36.9 C) 98.2 F (36.8 C)  SpO2: 97% 97%   Vitals:   07/14/23 2030 07/15/23 0124 07/15/23 0551 07/15/23 0715  BP: 136/79 (!) 159/88 (!) 140/94 (!) 139/94  Pulse: (!) 108 86 97 89  Resp: 20 20 20 17   Temp: 97.9 F (36.6 C) 98.6 F (37 C) 98.4 F (36.9 C) 98.2 F (36.8 C)  TempSrc: Oral Oral Oral Oral  SpO2: 100% 97% 97% 97%  Weight:   70.8 kg   Height:       Body mass index is 25.18 kg/m.   General: Alert awake, not in obvious distress HENT: pupils equally reacting to light,  No scleral pallor or icterus noted. Oral mucosa is moist.  Chest:  Clear breath sounds.  Diminished breath sounds bilaterally.  CVS: S1 &S2 heard. No murmur.  Regular rate and rhythm. Abdomen: Soft, nontender, nondistended.  Bowel sounds are heard.   Extremities: No cyanosis, clubbing or edema.  Peripheral pulses are palpable. Psych: Alert, awake and oriented, normal mood CNS:  No cranial nerve deficits.  Power equal in all extremities.   Skin: Warm and dry.  No rashes noted.  The results of significant diagnostics from this hospitalization (including imaging, microbiology, ancillary and laboratory) are listed below for reference.     Diagnostic Studies:   CT Head Wo Contrast Result Date: 07/14/2023 CLINICAL DATA:  Generalized body aches. EXAM: CT HEAD WITHOUT CONTRAST TECHNIQUE: Contiguous axial images were obtained from the base of the skull through the vertex without intravenous contrast. RADIATION DOSE REDUCTION: This exam was performed according to the departmental dose-optimization program which includes automated exposure control, adjustment of the mA and/or kV according to patient size and/or use of iterative reconstruction technique. COMPARISON:  June 09, 2023 FINDINGS: Brain: No evidence of acute infarction, hemorrhage, hydrocephalus, extra-axial collection or mass lesion/mass effect. Vascular:  No hyperdense vessel or unexpected calcification. Skull: Normal. Negative for fracture or focal lesion. Sinuses/Orbits: A chronic fracture deformity is seen involving the lamina papyracea on the left. Other: None. IMPRESSION: No acute intracranial abnormality. Electronically Signed   By: Aram Candela M.D.   On: 07/14/2023 01:30   DG Chest 2 View Result Date: 07/14/2023 CLINICAL DATA:  Body aches. EXAM: CHEST - 2 VIEW COMPARISON:  July 03, 2023 FINDINGS: There is stable mild to moderate severity cardiac silhouette enlargement. Mild to moderate severity prominence of the central pulmonary vasculature is noted. This is mildly increased in severity when compared to the prior study. There is no evidence of an acute infiltrate, pleural effusion or pneumothorax. The visualized skeletal structures are unremarkable. IMPRESSION: Stable cardiomegaly with mild to moderate severity central pulmonary vascular congestion. Electronically Signed   By: Aram Candela M.D.   On: 07/14/2023 00:54     Labs:   Basic Metabolic Panel: Recent Labs  Lab 07/14/23 0129 07/14/23 0133 07/14/23 0351 07/14/23 1855 07/15/23 0324  NA 138 141 136 138 137  K 3.9 3.9 5.3* 3.9 3.8  CL 108  --  105 106 104  CO2 21*  --  21* 26 25  GLUCOSE 104*  --  147* 109* 154*  BUN 34*  --  31* 25* 32*  CREATININE 1.55*  --  1.54* 1.63* 1.32*  CALCIUM 9.0  --  8.6* 8.9 8.9  MG  --   --   --   --  1.8   GFR Estimated Creatinine Clearance: 59.1 mL/min (A) (by C-G formula based on SCr of 1.32 mg/dL (H)). Liver Function Tests: Recent Labs  Lab 07/14/23 0351 07/14/23 1855  AST 69* 34  ALT 40 30  ALKPHOS 54 57  BILITOT 0.5 1.0  PROT 5.9* 6.6  ALBUMIN 3.0* 3.0*   No results for input(s): "LIPASE", "AMYLASE" in the last 168 hours. Recent Labs  Lab 07/14/23 0351  AMMONIA 56*   Coagulation profile No results for input(s): "INR", "PROTIME" in the last 168 hours.  CBC: Recent Labs  Lab 07/14/23 0129 07/14/23 0133  07/14/23 0551 07/14/23 1855 07/15/23 0324  WBC 3.9*  --  4.1 3.2* 3.0*  NEUTROABS 2.2  --   --   --   --   HGB 11.6* 12.2* 11.9* 13.1 12.7*  HCT 34.8* 36.0* 36.4* 38.7* 38.1*  MCV 103.3*  --  103.1* 101.0* 100.8*  PLT 128*  --  119* 133* 137*   Cardiac Enzymes: Recent Labs  Lab 07/14/23 0351  CKTOTAL 699*   BNP: Invalid input(s): "POCBNP" CBG: Recent Labs  Lab 07/14/23 0752  GLUCAP 134*   D-Dimer No results for input(s): "DDIMER" in the last 72 hours. Hgb A1c No results for input(s): "HGBA1C" in the last 72 hours. Lipid Profile No results for input(s): "CHOL", "HDL", "LDLCALC", "TRIG", "CHOLHDL", "LDLDIRECT" in the last 72 hours. Thyroid function studies Recent Labs    07/14/23 0351  TSH 0.608   Anemia work up No results for input(s): "VITAMINB12", "FOLATE", "FERRITIN", "TIBC", "IRON", "RETICCTPCT" in the last 72 hours. Microbiology Recent Results (from the past 240 hours)  Resp panel by RT-PCR (RSV, Flu A&B, Covid) Anterior Nasal Swab     Status: None   Collection Time: 07/14/23  1:29 AM   Specimen: Anterior Nasal Swab  Result Value Ref Range Status   SARS Coronavirus 2 by RT PCR NEGATIVE NEGATIVE Final   Influenza A by PCR NEGATIVE NEGATIVE Final   Influenza B by PCR NEGATIVE NEGATIVE Final    Comment: (NOTE) The Xpert Xpress SARS-CoV-2/FLU/RSV plus assay is intended as an aid in the diagnosis of influenza from Nasopharyngeal swab specimens and should not be used as a sole basis for treatment. Nasal washings and aspirates are unacceptable for Xpert Xpress SARS-CoV-2/FLU/RSV testing.  Fact Sheet for Patients: BloggerCourse.com  Fact Sheet for Healthcare Providers: SeriousBroker.it  This test is not yet approved or cleared by the [place] FDA and has been authorized for detection and/or diagnosis of SARS-CoV-2 by FDA under an Emergency Use Authorization (EUA). This EUA will remain in effect (meaning  this test can be used) for the duration of the COVID-19 declaration under Section 564(b)(1) of the Act, 21 U.S.C. section 360bbb-3(b)(1), unless the authorization is terminated or revoked.     Resp Syncytial Virus by PCR NEGATIVE NEGATIVE Final    Comment: (NOTE) Fact Sheet for Patients: BloggerCourse.com  Fact Sheet for Healthcare Providers: SeriousBroker.it  This test is not yet approved or cleared by the [place] FDA and has been authorized for detection and/or diagnosis of SARS-CoV-2 by FDA under an Emergency Use Authorization (EUA). This EUA will remain in effect (meaning this test can be used) for the duration of the COVID-19 declaration under Section 564(b)(1) of the Act, 21  U.S.C. section 360bbb-3(b)(1), unless the authorization is terminated or revoked.  Performed at [facility], [address], [place], [place] [address]      Discharge Instructions:   Discharge Instructions     (HEART FAILURE PATIENTS) Call MD:  Anytime you have any of the following symptoms: 1) 3 pound weight gain in 24 hours or 5 pounds in 1 week 2) shortness of breath, with or without a dry hacking cough 3) swelling in the hands, feet or stomach 4) if you have to sleep on extra pillows at night in order to breathe.   Complete by: As directed    Call MD for:  difficulty breathing, headache or visual disturbances   Complete by: As directed    Call MD for:  severe uncontrolled pain   Complete by: As directed    Call MD for:  temperature >100.4   Complete by: As directed    Diet general   Complete by: As directed    Discharge instructions   Complete by: As directed    Follow-up with your primary care provider in 1 week.  No smoking or use of illicit substances.  Take medications as prescribed.  Seek medical attention for worsening symptoms.  Fluid restriction less than 1500 mL/day.   Increase activity slowly   Complete by: As directed        Allergies as of 07/15/2023       Reactions   Banana Nausea And Vomiting   Egg-derived Products Nausea And Vomiting   Pork-derived Products Other (See Comments)   No pork products d/t Religious preference         Medication List     STOP taking these medications    torsemide 100 MG tablet Commonly known as: DEMADEX       TAKE these medications    albuterol 108 (90 Base) MCG/ACT inhaler Commonly known as: VENTOLIN HFA Inhale 2 puffs into the lungs every 4 (four) hours as needed for wheezing or shortness of breath.   amLODipine 10 MG tablet Commonly known as: NORVASC Take 1 tablet (10 mg total) by mouth daily.   empagliflozin 10 MG Tabs tablet Commonly known as: Jardiance Take 1 tablet (10 mg total) by mouth daily.   furosemide 40 MG tablet Commonly known as: LASIX Take 1 tablet (40 mg total) by mouth daily.   gabapentin 100 MG capsule Commonly known as: NEURONTIN Take 1 capsule (100 mg total) by mouth 2 (two) times daily.   losartan 25 MG tablet Commonly known as: COZAAR Take 1 tablet (25 mg total) by mouth daily.   spironolactone 25 MG tablet Commonly known as: ALDACTONE Take 1 tablet (25 mg total) by mouth daily.        Follow-up Information     Renaye Rakers, MD Follow up in 1 week(s).   Specialty: Family Medicine Contact information: [address] [place], [address] [place] [address] [phone]                  Time coordinating discharge: 39 minutes  Signed:  Lira Stephen  [facility] 07/15/2023, 10:57 AM

## 2023-07-15 NOTE — Progress Notes (Signed)
 Patient refused all morning medications stating that if the doctor was going to discharge him he is not taking any of the medications. Educated on the risk  of not taking the medication.

## 2023-07-15 NOTE — Discharge Instructions (Signed)
 [employer] assistance programs Crisis assistance programs  -[facility] Arts development officer. If you are experiencing homelessness in [place], [place], your first point of contact should be Pensions consultant. You can reach Coordinated Entry by calling ([phone] [phone] or by emailing [email or web address] [email or web address].  Community access points: [employer] [phone] [address], [place]) every Tuesday from 9am-10am. [facility] ([address] [place]) every Wednesday from 8am-9am.   -[place] Coordinated Re-entry Marcy Panning: Dial 211 and request. Offers referrals to homeless shelters in the area.    -The [employer] (805)762-9800) offers several services to local families, as funding allows. The Emergency Assistance Program (EAP), which they administer, provides household goods, free food, clothing, and financial aid to people in need in the [facility] area. The EAP program does have some qualification, and counselors will interview clients for financial assistance by written referral only. Referrals need to be made by the Department of Social Services or by other EAP approved human services agencies or charities in the area.  -[facility] of [employer], which can be reached at [phone], offers emergency assistance programs for those in need of help, such as food, rent assistance, a soup kitchen, shelter, and clothing. They are based in [facility] but provide a number of services to those that qualify for assistance.   [facility] Department of Social Services may be able to offer temporary financial assistance and cash grants for paying rent and utilities, Help may be provided for local county residents who may be experiencing personal crisis when other resources, including government programs, are not available. Call [phone]  -High [employer] is a [employer] agency, The organization can offer emergency assistance for paying rent, Care[employer] Rx, utilities, food, household products and furniture. They offer extensive emergency and transitional housing for families, children and single women, and also run a Boy's and [employer]. Thrift Shops, Secondary school teacher, and other aid offered too. [address] [place], [place] [address], [phone]  -[place] Low Income Energy Assistance Program -- This is offered for [facility] families. The federal government created [employer] Program provides a one-time cash grant payment to help eligible low-income families pay their electric and heating bills. [address], [place], [place] [address], ([phone]  -[place] Emergency Assistance -- A program offers emergency utility and rent funds for greater [employer] area residents. The program can also provide counseling and referrals to charities and government programs. Also provides food and a free meal program that serves lunch Mondays - Saturdays and dinner seven days per week to individuals in the community. [address] [place], [place] [address], [phone]  -[employer] - Offers affordable apartment and housing communities across      [place] and [place]. The low income and seniors can access public housing, rental assistance to qualified applicants, and apply for the section 8 rent subsidy program. Other programs include Chiropractor and Engineer, maintenance. [address] [place], [place] [address], dial [phone].  -The [facility] provides transitional housing to veterans and the disabled. Clients will also access other services too, including assistance in applying for Disability, life skills classes, case management, and assistance in finding permanent housing. [address] [place], [place]  [place] [address], call [phone]  -[place] Transitional Housing through [facility] is for people who were just  evicted or that are formerly homeless. The non-profit will also help then gain self-sufficiency, find a home or apartment to live in, and also provides information on rent assistance when needed. Phone [phone]  -The [place] [facility] [facility] helps low income, elderly, or disabled residents in seven counties in the [place] ([place], [place], [place], [place], [place], [place], [place], and [place]) save energy and reduce their utility bills by improving energy efficiency. Phone [phone].  -[employer] is located in the [place] [facility] in the [employer], [address], [place], [place] [address]. Parking is in the rear of the building. Phone: [phone]   General Email: info@gsohc .org  GHC provides free housing counseling assistance in locating affordable rental housing or housing with support services for families and individuals in crisis and the chronically homeless. We provide potential resources for other housing needs like utilities. Our trained counselors also work with clients on budgeting and financial literacy in effort to empower them to take control of their financial situations. [employer] collaborates with homeless service providers and other stakeholders as part of the [employer] COC (Continuum of Care). The (COC) is a regional/local planning body that coordinates housing and services funding for homeless families and individuals. The role of [facility] in the COC is through housing counseling to work with people we serve on diversion strategies for those that are at imminent risk of becoming homeless. We also work with the Coordinated Assessment/Entry Specialist who attempts to find temporary solutions and/or connects the people  to Housing First, Rapid Re-housing or transitional housing programs. Our Homelessness Prevention Housing Counselors meet with clients on business days (Monday-Fridays, except scheduled holidays) from 8:30 am to 4:30 pm.  Legal assistance for evictions, foreclosure, and more -If you need free legal advice on civil issues, such as foreclosures, evictions, Electronics engineer, government programs, domestic issues and more, Armed forces operational officer Aid of [place] [employer]) is a Associate Professor firm that provides free legal services and counsel to lower income people, seniors, disabled, and others, The goal is to ensure everyone has access to justice and fair representation. Call them at ([phone].  [facility] for Housing and Community Studies can provide info about obtaining legal assistance with evictions. Phone [phone].  Data processing manager  The [employer], [employer]. offers job and Dispensing optician. Resources are focused on helping students obtain the skills and experiences that are necessary to compete in today's challenging and tight job market. The non-profit faith-based community action agency offers internship trainings as well as classroom instruction. Classes are tailored to meet the needs of people in the [facility] region. [place], [place] [address], [phone]  Foreclosure prevention/Debt Services Family Services of the [employer] Credit Counseling Service inludes debt and foreclosure prevention programs for local families. This includes money management, financial advice, budget review and development of a written action plan with a Pensions consultant to help solve specific individual financial problems. In addition, housing and mortgage counselors can also provide pre- and post-purchase homeownership counseling, default resolution counseling (to prevent foreclosure) and reverse mortgage counseling. A Debt Management Program allows  people and families with a high level of credit card or medical debt to consolidate and repay consumer debt and loans to creditors and rebuild positive credit ratings and scores. Contact (336) Q4373065.  Community clinics in [place] -Health Department [facility]: [address], [place], [address]. [phone].  -Health Department [place] [facility]: [phone], [facility], [address]. [phone].  -[facility] offers medical care through a group of doctors, pharmacies and other healthcare related agencies that offer services for low income, uninsured adults in [place]. Also offers adult Dental care and assistance with applying for an [employer]. Call [phone].   [facility]. This center provides low-cost health care to those without health insurance. Services offered include an onsite pharmacy. Phone [phone]. [address], [place].  -Medication Assistance Program serves as a link between pharmaceutical companies and patients to provide low cost or free prescription medications. This service is available for residents who meet certain income restrictions and have no insurance coverage. PLEASE CALL [phone] Ginette Otto) OR [phone] ([place])  -One Step Further: Materials engineer, The [employer] Support & Nutrition Program, [employer]. Call [phone]/ [phone].  Food pantry and assistance -[facility]-Food Bank: [address], [place] [address]. Phone [phone]  -[facility] Food Pantry: [address] [place], [place] [address]. 4502974617.  -Missionary Ministry: has the purpose of visiting the sick and shut-ins and provide for needs in the surrounding communities. Call [phone]. Email: [email or web address] .com This program provides: Food box for seniors, Financial assistance, Food to meet basic  nutritional needs.  -Meals on Wheels with Senior Resources: [facility] residents age 59 and over who are homebound and unable to obtain and prepare a nutritious meal for themselves are eligible for this service. There may be a waiting list in certain parts of [facility] if the route in that area is full. If you are in [facility] and [place] call [phone] to register. For all other areas call [phone] to register.  -Greater Dietitian: https://findfood.BargainContractor.[email or web address]  TRANSPORTATION: -[employer] Department of Health: Call [facility] and [employer] at [phone] for details. AttractionGuides.es  -Access [place]: Access [place] is the [employer] Agency's shared-ride transportation service for eligible riders who have a disability that prevents them from riding the fixed route bus. Call [phone]. Access [place] riders must pay a fare of $1.50 per trip, or may purchase a 10-ride punch card for $14.00 ($1.40 per ride) or a 40-ride punch card for $48.00 ($1.20 per ride).  -The Shepherd's WHEELS rideshare transportation service is provided for senior citizens (60+) who live independently within [place] city limits and are unable to drive or have limited access to transportation. Call [phone] to schedule an appointment.  -[place] Transportation: For Medicare or Medicaid recipients call ([phone]?[facility], wheelchair Zenaida Niece, and ambulatory quotes available.   Affordable Housing Search http://www.nchousingsearch.org  [facility])   M-F 8a-3p [address] [place], [place] [address] [phone] Services include: laundry, barbering, support groups, case management, phone & computer access, showers, AA/NA mtgs, mental health/substance abuse nurse, job skills class, disability information, VA assistance, spiritual classes,  etc. Winter Shelter available when temperatures are less than 32 degrees.   HOMELESS SHELTERS [facility] at [facility]- Call [phone] ext. 347 or ext. 336. Located at [address], [place], [place] [address]  [facility] Mens Shelter- Call [phone]. Located at [address] [place] [address].  [facility]- [employer]. Call [phone]. Office located at [address] [place] [address].  [facility] Family Housing through [place] [phone].  [facility] Family [facility]ter- Call [phone]. Located at [address] [place], [place], [place] [address].  Room at the Inn-For Pregnant mothers. Call [phone]. Located at [address] [place], [address].   Shelter of Hope-For men in [place]. Call [phone]. [facility]-Shelter in [place]. Call [phone].  Home of [employer] for [employer] [phone]. Office located at [address] [place], [address].  [employer] be agreeable to help with chores. Call [phone] ext. 5000.  Men's: [address] [place], [place] [address]. Women's: [facility]  [address] [place], [place] [address]  Crisis Services [facility] Mobile Crisis Management- [phone]  [facility] [address], [place], [place] [address]. Phone: [phone]

## 2023-07-15 NOTE — TOC Transition Note (Signed)
 Transition of Care [facility]) - Discharge Note   Patient Details  Name: Charles [name]. MRN: [id number] Date of Birth: October 15, 1970  Transition of Care [facility]) CM/SW Contact:  Leone Haven, RN Phone Number: 07/15/2023, 10:26 AM   Clinical Narrative:    For dc has bus pass, has no needs.           Patient Goals and CMS Choice            Discharge Placement                       Discharge Plan and Services Additional resources added to the After Visit Summary for                                       Social Drivers of Health (SDOH) Interventions SDOH Screenings   Food Insecurity: Food Insecurity Present (07/14/2023)  Housing: High Risk (07/14/2023)  Transportation Needs: Unmet Transportation Needs (07/14/2023)  Utilities: At Risk (07/14/2023)  Alcohol Screen: Medium Risk (02/24/2023)  Financial Resource Strain: Low Risk  (07/07/2023)  Social Connections: Unknown (09/24/2021)   [facility]  Tobacco Use: High Risk (07/14/2023)     Readmission Risk Interventions    06/23/2023    4:32 PM 01/29/2023    8:43 AM 10/04/2022   11:27 AM  Readmission Risk Prevention Plan  Transportation Screening Complete Complete Complete  Medication Review Oceanographer) Complete Complete Complete  PCP or Specialist appointment within 3-5 days of discharge Complete Complete Complete  HRI or Home Care Consult Complete Complete Complete  SW Recovery Care/Counseling Consult Complete  Complete  Palliative Care Screening Not Applicable Not Applicable Not Applicable  Skilled Nursing Facility Not Applicable Not Applicable Not Applicable

## 2023-07-15 NOTE — TOC Initial Note (Signed)
 Transition of Care [facility]) - Inpatient Brief Assessment   Patient Details  Name: Charles [name]. MRN: [id number] Date of Birth: October 24, 1970  Transition [facility]) CM/SW Contact:    Michaela Corner, LCSWA Phone Number: 07/15/2023, 8:33 AM   Clinical Narrative: Patient is experiencing homelessness. TOC familiar with patient and added appropriate resources to patients AVS.   TOC will continue to follow as needed.   Transition of Care Asessment: Insurance and Status: Insurance coverage has been reviewed   Home environment has been reviewed: homeless Prior level of function:: indpendent Prior/Current Home Services: No current home services Social Drivers of Health Review: SDOH reviewed interventions complete Readmission risk has been reviewed: Yes Transition of care needs: no transition of care needs at this time

## 2023-07-15 NOTE — Plan of Care (Signed)
  Problem: Education: Goal: Knowledge of General Education information will improve Description: Including pain rating scale, medication(s)/side effects and non-pharmacologic comfort measures 07/15/2023 0943 by Martie Round, RN Outcome: Adequate for Discharge 07/15/2023 [phone] by Martie Round, RN Outcome: Not Progressing   Problem: Health Behavior/Discharge Planning: Goal: Ability to manage health-related needs will improve 07/15/2023 0943 by Martie Round, RN Outcome: Adequate for Discharge 07/15/2023 [phone] by Martie Round, RN Outcome: Not Progressing   Problem: Clinical Measurements: Goal: Ability to maintain clinical measurements within normal limits will improve 07/15/2023 0943 by Martie Round, RN Outcome: Adequate for Discharge 07/15/2023 0814 by Martie Round, RN Outcome: Not Progressing Goal: Will remain free from infection 07/15/2023 0943 by Martie Round, RN Outcome: Adequate for Discharge 07/15/2023 [phone] by Martie Round, RN Outcome: Not Progressing Goal: Diagnostic test results will improve 07/15/2023 0943 by Martie Round, RN Outcome: Adequate for Discharge 07/15/2023 [phone] by Martie Round, RN Outcome: Not Progressing Goal: Respiratory complications will improve 07/15/2023 0943 by Martie Round, RN Outcome: Adequate for Discharge 07/15/2023 [phone] by Martie Round, RN Outcome: Not Progressing Goal: Cardiovascular complication will be avoided 07/15/2023 0943 by Martie Round, RN Outcome: Adequate for Discharge 07/15/2023 0814 by Martie Round, RN Outcome: Not Progressing   Problem: Activity: Goal: Risk for activity intolerance will decrease 07/15/2023 0943 by Martie Round, RN Outcome: Adequate for Discharge 07/15/2023 [phone] by Martie Round, RN Outcome: Not Progressing   Problem: Nutrition: Goal: Adequate nutrition will be maintained 07/15/2023 0943 by Martie Round, RN Outcome: Adequate for Discharge 07/15/2023 [phone] by Martie Round,  RN Outcome: Not Progressing   Problem: Coping: Goal: Level of anxiety will decrease 07/15/2023 0943 by Martie Round, RN Outcome: Adequate for Discharge 07/15/2023 [phone] by Martie Round, RN Outcome: Not Progressing   Problem: Elimination: Goal: Will not experience complications related to bowel motility 07/15/2023 0943 by Martie Round, RN Outcome: Adequate for Discharge 07/15/2023 [phone] by Martie Round, RN Outcome: Not Progressing Goal: Will not experience complications related to urinary retention 07/15/2023 0943 by Martie Round, RN Outcome: Adequate for Discharge 07/15/2023 [phone] by Martie Round, RN Outcome: Not Progressing   Problem: Pain Managment: Goal: General experience of comfort will improve and/or be controlled 07/15/2023 0943 by Martie Round, RN Outcome: Adequate for Discharge 07/15/2023 [phone] by Martie Round, RN Outcome: Not Progressing   Problem: Safety: Goal: Ability to remain free from injury will improve 07/15/2023 0943 by Martie Round, RN Outcome: Adequate for Discharge 07/15/2023 [phone] by Martie Round, RN Outcome: Not Progressing   Problem: Skin Integrity: Goal: Risk for impaired skin integrity will decrease 07/15/2023 0943 by Martie Round, RN Outcome: Adequate for Discharge 07/15/2023 0814 by Martie Round, RN Outcome: Not Progressing

## 2023-07-15 NOTE — ED Provider Triage Note (Signed)
 Emergency Medicine Provider Triage Evaluation Note  Charles [name]. , a 53 y.o. male  was evaluated in triage.  Pt complains of bilateral hand and leg swelling.  Discharged this morning but has been in sleeping in the discharge lobby all day and decided to check it.  Review of Systems  Positive: Hand swelling Negative:   Physical Exam  BP (!) 134/104 (BP Location: Right Arm)   Pulse 98   Temp 98.2 F (36.8 C)   Resp 18   SpO2 98%  Gen:   Awake, no distress   Resp:  Normal effort  MSK:   Moves extremities without difficulty  Other:    Medical Decision Making  Medically screening exam initiated at 6:44 PM.  Appropriate orders placed.  Charles [name]. was informed that the remainder of the evaluation will be completed by another provider, this initial triage assessment does not replace that evaluation, and the importance of remaining in the ED until their evaluation is complete.     Arthor Captain, PA-C 07/15/23 1845

## 2023-07-16 ENCOUNTER — Emergency Department (HOSPITAL_COMMUNITY)

## 2023-07-16 DIAGNOSIS — M79641 Pain in right hand: Secondary | ICD-10-CM | POA: Diagnosis not present

## 2023-07-16 DIAGNOSIS — G9341 Metabolic encephalopathy: Secondary | ICD-10-CM | POA: Diagnosis not present

## 2023-07-16 DIAGNOSIS — M7989 Other specified soft tissue disorders: Secondary | ICD-10-CM | POA: Diagnosis not present

## 2023-07-16 LAB — D-DIMER, QUANTITATIVE: D-Dimer, Quant: 0.53 ug{FEU}/mL — ABNORMAL HIGH (ref 0.00–0.50)

## 2023-07-16 NOTE — ED Provider Notes (Signed)
 [place] EMERGENCY DEPARTMENT AT [facility] Provider Note   CSN: [id number] Arrival date & time: 07/15/23  1732     History  Chief Complaint  Patient presents with   Hand swelling    Charles Mathes. is a 53 y.o. [name].  This is a Charles [name] who is here today for right hand swelling and pain.  Patient well-known to our emergency department, was discharged earlier this morning.        Home Medications Prior to Admission medications   Medication Sig Start Date End Date Taking? Authorizing Provider  albuterol (VENTOLIN HFA) 108 (90 Base) MCG/ACT inhaler Inhale 2 puffs into the lungs every 4 (four) hours as needed for wheezing or shortness of breath. 07/15/23   Pokhrel, Rebekah Chesterfield, MD  amLODipine (NORVASC) 10 MG tablet Take 1 tablet (10 mg total) by mouth daily. 07/15/23 10/13/23  Pokhrel, Rebekah Chesterfield, MD  empagliflozin (JARDIANCE) 10 MG TABS tablet Take 1 tablet (10 mg total) by mouth daily. 07/15/23   Pokhrel, Rebekah Chesterfield, MD  furosemide (LASIX) 40 MG tablet Take 1 tablet (40 mg total) by mouth daily. 07/15/23   Pokhrel, Rebekah Chesterfield, MD  gabapentin (NEURONTIN) 100 MG capsule Take 1 capsule (100 mg total) by mouth 2 (two) times daily. 07/15/23 10/13/23  Pokhrel, Rebekah Chesterfield, MD  losartan (COZAAR) 25 MG tablet Take 1 tablet (25 mg total) by mouth daily. 07/15/23   Pokhrel, Rebekah Chesterfield, MD  spironolactone (ALDACTONE) 25 MG tablet Take 1 tablet (25 mg total) by mouth daily. 07/15/23 08/14/23  Pokhrel, Rebekah Chesterfield, MD      Allergies    Banana, Egg-derived products, and Pork-derived products    Review of Systems   Review of Systems  Physical Exam Updated Vital Signs BP (!) 150/120 (BP Location: Right Wrist)   Pulse 96   Temp 98.7 F (37.1 C) (Oral)   Resp 18   SpO2 94%  Physical Exam Vitals reviewed.  Cardiovascular:     Rate and Rhythm: Normal rate.  Pulmonary:     Effort: Pulmonary effort is normal.  Musculoskeletal:     Right lower leg: No edema.     Left lower leg: No edema.     Comments: Mild  swelling of the right hand compared to left.  No warmth, no tenderness  Neurological:     Mental Status: Charles [name] is alert.     ED Results / Procedures / Treatments   Labs (all labs ordered are listed, but only abnormal results are displayed) Labs Reviewed  BASIC METABOLIC PANEL - Abnormal; Notable for the following components:      Result Value   Glucose, Bld 156 (*)    BUN 25 (*)    Creatinine, Ser 1.30 (*)    All other components within normal limits  CBC WITH DIFFERENTIAL/PLATELET - Abnormal; Notable for the following components:   WBC 2.8 (*)    RBC 3.96 (*)    MCV 101.5 (*)    RDW 18.0 (*)    Neutro Abs 1.2 (*)    All other components within normal limits  BRAIN NATRIURETIC PEPTIDE - Abnormal; Notable for the following components:   B Natriuretic Peptide 336.1 (*)    All other components within normal limits  MAGNESIUM  D-DIMER, QUANTITATIVE    EKG None  Radiology No results found.  Procedures Procedures    Medications Ordered in ED Medications - No data to display  ED Course/ Medical Decision Making/ A&P  Medical Decision Making 53 year old [name] here today for right hand swelling.  Plan-on exam, patient's right hand is swollen compared to the left.  There is no warmth, no tenderness.  Will obtain plain films, D-dimer.  If dimer elevated, will obtain ultrasound.  This patient's health care is complicated by the following social determinants of health-housing insecurity, homelessness.  Patient's labs reviewed, at baseline.  Normal vital signs, overall looks well.  Reassessment 4:15 AM-patient's D-dimer 0.53.  Applying years criteria, which is more often applied to pulmonary embolism, believe thrombus less likely, particularly with the location of the patient swelling.  Patient's x-ray shows old fracture.  Believe this is likely chronic swelling.  Lower suspicion for cellulitis.  Will discharge patient.  Amount and/or Complexity  of Data Reviewed Labs: ordered. Radiology: ordered.           Final Clinical Impression(s) / ED Diagnoses Final diagnoses:  None    Rx / DC Orders ED Discharge Orders     None         Charles Simmonds T, DO 07/16/23 [phone]

## 2023-07-16 NOTE — Discharge Instructions (Signed)
 Your x-ray that was done was normal.  Your blood work was normal for you.  Return to the emergency room if you have swelling up your arm.  Please follow-up with the [facility] or your PCP within 1 week.

## 2023-07-17 ENCOUNTER — Encounter (HOSPITAL_COMMUNITY): Payer: Medicare HMO

## 2023-07-22 ENCOUNTER — Ambulatory Visit: Payer: Medicare HMO | Admitting: Family

## 2023-07-22 ENCOUNTER — Other Ambulatory Visit (HOSPITAL_COMMUNITY): Payer: Self-pay

## 2023-07-23 ENCOUNTER — Other Ambulatory Visit (HOSPITAL_COMMUNITY): Payer: Self-pay

## 2023-07-24 ENCOUNTER — Other Ambulatory Visit (HOSPITAL_COMMUNITY): Payer: Self-pay

## 2023-07-31 ENCOUNTER — Emergency Department (HOSPITAL_COMMUNITY)

## 2023-07-31 ENCOUNTER — Other Ambulatory Visit: Payer: Self-pay

## 2023-07-31 ENCOUNTER — Encounter (HOSPITAL_COMMUNITY): Payer: Self-pay

## 2023-07-31 ENCOUNTER — Inpatient Hospital Stay (HOSPITAL_COMMUNITY)
Admission: EM | Admit: 2023-07-31 | Discharge: 2023-08-05 | DRG: 291 | Disposition: A | Discharge status: Home / Self Care | Attending: Internal Medicine | Admitting: Internal Medicine

## 2023-07-31 DIAGNOSIS — D72819 Decreased white blood cell count, unspecified: Secondary | ICD-10-CM | POA: Diagnosis not present

## 2023-07-31 DIAGNOSIS — J9811 Atelectasis: Secondary | ICD-10-CM | POA: Diagnosis present

## 2023-07-31 DIAGNOSIS — F1910 Other psychoactive substance abuse, uncomplicated: Secondary | ICD-10-CM | POA: Diagnosis present

## 2023-07-31 DIAGNOSIS — Z91014 Allergy to mammalian meats: Secondary | ICD-10-CM | POA: Diagnosis not present

## 2023-07-31 DIAGNOSIS — R590 Localized enlarged lymph nodes: Secondary | ICD-10-CM | POA: Diagnosis present

## 2023-07-31 DIAGNOSIS — N189 Chronic kidney disease, unspecified: Secondary | ICD-10-CM | POA: Diagnosis not present

## 2023-07-31 DIAGNOSIS — M6282 Rhabdomyolysis: Secondary | ICD-10-CM | POA: Diagnosis not present

## 2023-07-31 DIAGNOSIS — R7989 Other specified abnormal findings of blood chemistry: Secondary | ICD-10-CM | POA: Diagnosis present

## 2023-07-31 DIAGNOSIS — D61818 Other pancytopenia: Secondary | ICD-10-CM | POA: Diagnosis present

## 2023-07-31 DIAGNOSIS — N1832 Chronic kidney disease, stage 3b: Secondary | ICD-10-CM | POA: Diagnosis present

## 2023-07-31 DIAGNOSIS — R0989 Other specified symptoms and signs involving the circulatory and respiratory systems: Secondary | ICD-10-CM | POA: Diagnosis not present

## 2023-07-31 DIAGNOSIS — J42 Unspecified chronic bronchitis: Secondary | ICD-10-CM | POA: Diagnosis not present

## 2023-07-31 DIAGNOSIS — N182 Chronic kidney disease, stage 2 (mild): Secondary | ICD-10-CM | POA: Diagnosis not present

## 2023-07-31 DIAGNOSIS — I130 Hypertensive heart and chronic kidney disease with heart failure and stage 1 through stage 4 chronic kidney disease, or unspecified chronic kidney disease: Secondary | ICD-10-CM | POA: Diagnosis not present

## 2023-07-31 DIAGNOSIS — Z801 Family history of malignant neoplasm of trachea, bronchus and lung: Secondary | ICD-10-CM

## 2023-07-31 DIAGNOSIS — R918 Other nonspecific abnormal finding of lung field: Secondary | ICD-10-CM | POA: Diagnosis not present

## 2023-07-31 DIAGNOSIS — F1410 Cocaine abuse, uncomplicated: Secondary | ICD-10-CM | POA: Diagnosis present

## 2023-07-31 DIAGNOSIS — D696 Thrombocytopenia, unspecified: Secondary | ICD-10-CM | POA: Diagnosis not present

## 2023-07-31 DIAGNOSIS — Z720 Tobacco use: Secondary | ICD-10-CM | POA: Diagnosis not present

## 2023-07-31 DIAGNOSIS — Z79899 Other long term (current) drug therapy: Secondary | ICD-10-CM

## 2023-07-31 DIAGNOSIS — Z7151 Drug abuse counseling and surveillance of drug abuser: Secondary | ICD-10-CM

## 2023-07-31 DIAGNOSIS — R0602 Shortness of breath: Secondary | ICD-10-CM | POA: Diagnosis present

## 2023-07-31 DIAGNOSIS — J439 Emphysema, unspecified: Secondary | ICD-10-CM | POA: Diagnosis not present

## 2023-07-31 DIAGNOSIS — D649 Anemia, unspecified: Secondary | ICD-10-CM | POA: Diagnosis not present

## 2023-07-31 DIAGNOSIS — Z7984 Long term (current) use of oral hypoglycemic drugs: Secondary | ICD-10-CM | POA: Diagnosis not present

## 2023-07-31 DIAGNOSIS — Z800 Family history of malignant neoplasm of digestive organs: Secondary | ICD-10-CM

## 2023-07-31 DIAGNOSIS — J449 Chronic obstructive pulmonary disease, unspecified: Secondary | ICD-10-CM | POA: Diagnosis present

## 2023-07-31 DIAGNOSIS — Z91012 Allergy to eggs: Secondary | ICD-10-CM

## 2023-07-31 DIAGNOSIS — I10 Essential (primary) hypertension: Secondary | ICD-10-CM | POA: Diagnosis not present

## 2023-07-31 DIAGNOSIS — Z91018 Allergy to other foods: Secondary | ICD-10-CM

## 2023-07-31 DIAGNOSIS — N179 Acute kidney failure, unspecified: Secondary | ICD-10-CM | POA: Diagnosis present

## 2023-07-31 DIAGNOSIS — I509 Heart failure, unspecified: Secondary | ICD-10-CM | POA: Diagnosis not present

## 2023-07-31 DIAGNOSIS — I517 Cardiomegaly: Secondary | ICD-10-CM | POA: Diagnosis not present

## 2023-07-31 DIAGNOSIS — R609 Edema, unspecified: Secondary | ICD-10-CM | POA: Diagnosis not present

## 2023-07-31 DIAGNOSIS — R0600 Dyspnea, unspecified: Secondary | ICD-10-CM | POA: Diagnosis not present

## 2023-07-31 DIAGNOSIS — I5023 Acute on chronic systolic (congestive) heart failure: Secondary | ICD-10-CM | POA: Diagnosis not present

## 2023-07-31 DIAGNOSIS — D7589 Other specified diseases of blood and blood-forming organs: Secondary | ICD-10-CM | POA: Diagnosis present

## 2023-07-31 DIAGNOSIS — Z1152 Encounter for screening for COVID-19: Secondary | ICD-10-CM | POA: Diagnosis not present

## 2023-07-31 DIAGNOSIS — B182 Chronic viral hepatitis C: Secondary | ICD-10-CM | POA: Diagnosis present

## 2023-07-31 DIAGNOSIS — I2489 Other forms of acute ischemic heart disease: Secondary | ICD-10-CM | POA: Diagnosis present

## 2023-07-31 DIAGNOSIS — R748 Abnormal levels of other serum enzymes: Secondary | ICD-10-CM

## 2023-07-31 LAB — TROPONIN I (HIGH SENSITIVITY)
Troponin I (High Sensitivity): 96 ng/L — ABNORMAL HIGH (ref <18)
Troponin I (High Sensitivity): 83 ng/L — ABNORMAL HIGH (ref <18)
Troponin I (High Sensitivity): 90 ng/L — ABNORMAL HIGH (ref <18)

## 2023-07-31 LAB — CBC WITH DIFFERENTIAL/PLATELET
Abs Immature Granulocytes: 0.01 10*3/uL (ref 0.00–0.07)
Basophils Absolute: 0 10*3/uL (ref 0.0–0.1)
Basophils Relative: 0 %
Eosinophils Absolute: 0 10*3/uL (ref 0.0–0.5)
Eosinophils Relative: 1 %
HCT: 33.9 % — ABNORMAL LOW (ref 39.0–52.0)
Hemoglobin: 11.1 g/dL — ABNORMAL LOW (ref 13.0–17.0)
Immature Granulocytes: 0 %
Lymphocytes Relative: 29 %
Lymphs Abs: 1.1 10*3/uL (ref 0.7–4.0)
MCH: 34 pg (ref 26.0–34.0)
MCHC: 32.7 g/dL (ref 30.0–36.0)
MCV: 104 fL — ABNORMAL HIGH (ref 80.0–100.0)
Monocytes Absolute: 0.6 10*3/uL (ref 0.1–1.0)
Monocytes Relative: 14 %
Neutro Abs: 2.1 10*3/uL (ref 1.7–7.7)
Neutrophils Relative %: 56 %
Platelets: 117 10*3/uL — ABNORMAL LOW (ref 150–400)
RBC: 3.26 MIL/uL — ABNORMAL LOW (ref 4.22–5.81)
RDW: 18 % — ABNORMAL HIGH (ref 11.5–15.5)
WBC: 3.9 10*3/uL — ABNORMAL LOW (ref 4.0–10.5)
nRBC: 0 % (ref 0.0–0.2)

## 2023-07-31 LAB — COMPREHENSIVE METABOLIC PANEL
ALT: 25 U/L (ref 0–44)
AST: 39 U/L (ref 15–41)
Albumin: 3.2 g/dL — ABNORMAL LOW (ref 3.5–5.0)
Alkaline Phosphatase: 58 U/L (ref 38–126)
Anion gap: 10 (ref 5–15)
BUN: 21 mg/dL — ABNORMAL HIGH (ref 6–20)
CO2: 21 mmol/L — ABNORMAL LOW (ref 22–32)
Calcium: 8.9 mg/dL (ref 8.9–10.3)
Chloride: 107 mmol/L (ref 98–111)
Creatinine, Ser: 1.55 mg/dL — ABNORMAL HIGH (ref 0.61–1.24)
GFR, Estimated: 54 mL/min — ABNORMAL LOW (ref >60)
Glucose, Bld: 125 mg/dL — ABNORMAL HIGH (ref 70–99)
Potassium: 3.9 mmol/L (ref 3.5–5.1)
Sodium: 138 mmol/L (ref 135–145)
Total Bilirubin: 0.8 mg/dL (ref 0.0–1.2)
Total Protein: 6.4 g/dL — ABNORMAL LOW (ref 6.5–8.1)

## 2023-07-31 LAB — BRAIN NATRIURETIC PEPTIDE
B Natriuretic Peptide: 685.2 pg/mL — ABNORMAL HIGH (ref 0.0–100.0)
B Natriuretic Peptide: 1054.2 pg/mL — ABNORMAL HIGH (ref 0.0–100.0)

## 2023-07-31 LAB — CK: Total CK: 729 U/L — ABNORMAL HIGH (ref 49–397)

## 2023-07-31 LAB — DIGOXIN LEVEL: Digoxin Level: <0.2 ng/mL — ABNORMAL LOW (ref 0.8–2.0)

## 2023-07-31 MED ORDER — ACETAMINOPHEN 650 MG RE SUPP: 650.0000 mg | Freq: Four times a day (QID) | RECTAL | Status: DC | PRN

## 2023-07-31 MED ORDER — FUROSEMIDE 10 MG/ML IJ SOLN: 20.0000 mg | Freq: Three times a day (TID) | INTRAMUSCULAR | Status: DC

## 2023-07-31 MED ORDER — ACETAMINOPHEN 325 MG PO TABS
650.0000 mg | ORAL_TABLET | Freq: Four times a day (QID) | ORAL | Status: DC | PRN
  Administered 2023-08-04: 650 mg via ORAL
  Filled 2023-07-31: qty 2

## 2023-07-31 MED ORDER — HYDRALAZINE HCL 20 MG/ML IJ SOLN: 10.0000 mg | Freq: Three times a day (TID) | INTRAMUSCULAR | Status: DC | PRN

## 2023-07-31 MED ORDER — METHOCARBAMOL 500 MG PO TABS
500.0000 mg | ORAL_TABLET | Freq: Once | ORAL | Status: AC
  Administered 2023-07-31: 500 mg via ORAL
  Filled 2023-07-31: qty 1

## 2023-07-31 MED ORDER — MORPHINE SULFATE (PF) 2 MG/ML IV SOLN: 2.0000 mg | INTRAVENOUS | Status: DC | PRN

## 2023-07-31 MED ORDER — IOHEXOL 350 MG/ML SOLN
50.0000 mL | Freq: Once | INTRAVENOUS | Status: AC | PRN
  Administered 2023-07-31: 50 mL via INTRAVENOUS

## 2023-07-31 MED ORDER — FUROSEMIDE 20 MG PO TABS
40.0000 mg | ORAL_TABLET | Freq: Once | ORAL | Status: AC
  Administered 2023-07-31: 40 mg via ORAL
  Filled 2023-07-31: qty 2

## 2023-07-31 MED ORDER — ACETAMINOPHEN 325 MG PO TABS
650.0000 mg | ORAL_TABLET | Freq: Once | ORAL | Status: AC
  Administered 2023-07-31: 650 mg via ORAL
  Filled 2023-07-31: qty 2
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
## 2023-07-31 NOTE — ED Provider Notes (Signed)
 [place] EMERGENCY DEPARTMENT AT [facility] Provider Note   CSN: [id number] Arrival date & time: 07/31/23  4098     History  Chief Complaint  Patient presents with   Shortness of Breath    Charles [name]. is a 53 y.o. male with a hx of CHF with last EF 30-35%, hypertension, sleep apnea, aplastic anemia, CKD, COPD, seizures, chronic hepatitis C, and polysubstance use who presents to the emergency department via EMS with complaints of shortness of breath that began a couple of days ago.  Patient reports the dyspnea is intermittent, worse with activity, no alleviating factors.  He also notes associated swelling in his bilateral lower extremities. Had an episode of chest pain overnight that he has a difficult time describing, resolved @ present, did have associated episode of emesis with this, no additional emesis.  He is not complaining of pain "all over" and is having difficulty staying still.  He denies fever, chills, cough, hemoptysis, abdominal pain, numbness, weakness, lightheadedness, dizziness, vision change, or syncope.  He states that he has been binging cocaine over the past 1 week, utilizing daily, states that he smokes this, most recently utilized 1 hour prior to arrival.  Patient has not been taking his home medications.   HPI     Home Medications Prior to Admission medications   Medication Sig Start Date End Date Taking? Authorizing Provider  albuterol (VENTOLIN HFA) 108 (90 Base) MCG/ACT inhaler Inhale 2 puffs into the lungs every 4 (four) hours as needed for wheezing or shortness of breath. 07/15/23   Pokhrel, Rebekah Chesterfield, MD  amLODipine (NORVASC) 10 MG tablet Take 1 tablet (10 mg total) by mouth daily. 07/15/23 10/13/23  Pokhrel, Rebekah Chesterfield, MD  empagliflozin (JARDIANCE) 10 MG TABS tablet Take 1 tablet (10 mg total) by mouth daily. 07/15/23   Pokhrel, Rebekah Chesterfield, MD  furosemide (LASIX) 40 MG tablet Take 1 tablet (40 mg total) by mouth daily. 07/15/23   Pokhrel, Rebekah Chesterfield, MD   gabapentin (NEURONTIN) 100 MG capsule Take 1 capsule (100 mg total) by mouth 2 (two) times daily. 07/15/23 10/13/23  Pokhrel, Rebekah Chesterfield, MD  losartan (COZAAR) 25 MG tablet Take 1 tablet (25 mg total) by mouth daily. 07/15/23   Pokhrel, Rebekah Chesterfield, MD  spironolactone (ALDACTONE) 25 MG tablet Take 1 tablet (25 mg total) by mouth daily. 07/15/23 08/14/23  Pokhrel, Rebekah Chesterfield, MD      Allergies    Banana, Egg-derived products, and Pork-derived products    Review of Systems   Review of Systems  Constitutional:  Negative for chills and fever.  Respiratory:  Positive for shortness of breath. Negative for cough.   Cardiovascular:  Positive for chest pain (resolved) and leg swelling.  Gastrointestinal:  Positive for vomiting (1 episode of emesis). Negative for abdominal pain, blood in stool and diarrhea.  Genitourinary:  Negative for dysuria.  Musculoskeletal:  Positive for myalgias.  Neurological:  Negative for dizziness, syncope, weakness, light-headedness and numbness.  All other systems reviewed and are negative.   Physical Exam Updated Vital Signs BP (!) 159/93 (BP Location: Right Arm)   Pulse 97   Temp 98.3 F (36.8 C) (Oral)   SpO2 96%  Physical Exam Vitals and nursing note reviewed.  Constitutional:      General: He is not in acute distress.    Appearance: He is not toxic-appearing.  HENT:     Head: Normocephalic and atraumatic.  Cardiovascular:     Rate and Rhythm: Normal rate and regular rhythm.     Pulses:  Radial pulses are 2+ on the right side and 2+ on the left side.       Dorsalis pedis pulses are 2+ on the right side and 2+ on the left side.  Pulmonary:     Effort: No respiratory distress or retractions.     Breath sounds: No stridor. No wheezing or rales.     Comments: Decreased breath sounds at the bases.  Abdominal:     Palpations: Abdomen is soft.     Tenderness: There is no abdominal tenderness. There is no guarding or rebound.  Musculoskeletal:     Cervical back: Neck  supple.     Comments: 2+ pitting edema to the bilateral lower legs.  Ranging bilateral upper/lower extremities at all major joints without difficulty, compartments are soft, no focal tenderness.  No midline spinal tenderness.   Neurological:     Mental Status: He is alert.     Comments: Sensation & strength grossly intact x 4.   Psychiatric:     Comments: Mildly agitated     ED Results / Procedures / Treatments   Labs (all labs ordered are listed, but only abnormal results are displayed) Labs Reviewed  COMPREHENSIVE METABOLIC PANEL - Abnormal; Notable for the following components:      Result Value   CO2 21 (*)    Glucose, Bld 125 (*)    BUN 21 (*)    Creatinine, Ser 1.55 (*)    Total Protein 6.4 (*)    Albumin 3.2 (*)    GFR, Estimated 54 (*)    All other components within normal limits  CBC WITH DIFFERENTIAL/PLATELET - Abnormal; Notable for the following components:   WBC 3.9 (*)    RBC 3.26 (*)    Hemoglobin 11.1 (*)    HCT 33.9 (*)    MCV 104.0 (*)    RDW 18.0 (*)    Platelets 117 (*)    All other components within normal limits  CK - Abnormal; Notable for the following components:   Total CK 729 (*)    All other components within normal limits  TROPONIN I (HIGH SENSITIVITY) - Abnormal; Notable for the following components:   Troponin I (High Sensitivity) 96 (*)    All other components within normal limits  BRAIN NATRIURETIC PEPTIDE  TROPONIN I (HIGH SENSITIVITY)    EKG EKG Interpretation Date/Time:  Thursday July 31 2023 06:54:21 EDT Ventricular Rate:  90 PR Interval:  176 QRS Duration:  79 QT Interval:  405 QTC Calculation: 496 R Axis:   45  Text Interpretation: Sinus rhythm Abnormal R-wave progression, late transition Left ventricular hypertrophy Abnormal T, consider ischemia, diffuse leads Poor data quality, interpretation may be adversely affected Confirmed by Pricilla Loveless [id number]) on 07/31/2023 7:29:20 AM  Radiology CT Chest W Contrast Result  Date: 07/31/2023 CLINICAL DATA:  Dyspnea.  Abnormal chest x-ray EXAM: CT CHEST WITH CONTRAST TECHNIQUE: Multidetector CT imaging of the chest was performed during intravenous contrast administration. RADIATION DOSE REDUCTION: This exam was performed according to the departmental dose-optimization program which includes automated exposure control, adjustment of the mA and/or kV according to patient size and/or use of iterative reconstruction technique. CONTRAST:  50mL OMNIPAQUE IOHEXOL 350 MG/ML SOLN COMPARISON:  Chest x-ray earlier 07/31/2023. CT scan 01/27/2023, CT angiogram. FINDINGS: Cardiovascular: Heart is enlarged. Coronary artery calcifications are seen. Trace pericardial fluid. The thoracic aorta has a normal course and caliber with slight atherosclerotic plaque. Once again there is enlargement of the pulmonary arteries. Please correlate for pulmonary hypertension.  Mediastinum/Nodes: Preserved thyroid gland. Normal caliber thoracic esophagus. There is some motion. There is some small nodes identified in the axillary regions bilaterally but are less than a cm short axis and not pathologic by size criteria. These also were seen on the prior CT. Enlarged mediastinal nodes are again seen. This includes left paratracheal measuring 16 mm in short axis on series 3, image 50. Previously this would have measured 15 mm, unchanged. Other small mediastinal nodes are stable as well. There is some small bilateral hilar nodes as well. The contrast opacification on this examination is relatively limited which may be technical and related to the patient's cardiac output. Lungs/Pleura: Significant motion throughout the examination. Underlying centrilobular emphysematous changes. Dependent scarring atelectatic changes of the right lung base greater than left. Mild interstitial septal thickening. No pneumothorax, consolidation or effusion. Upper Abdomen: Adrenal glands are preserved in the upper abdomen. Clips in the left upper  quadrant near the splenic hilum, unchanged. Musculoskeletal: Mild degenerative changes along the spine with near bridging and some bridging endplate osteophytes along the lower thoracic spine upper lumbar spine. IMPRESSION: Limited examination by motion in the contrast bolus. Enlarged heart with enlargement of the pulmonary arteries. Emphysematous lung changes identified with some scarring atelectatic changes as well as interstitial septal thickening, similar to previous. There are persistent mildly enlarged mediastinal lymph nodes identified similar to the previous examination. Recommend continued follow up surveillance. The finding by x-ray at the right hilum may be related to the enlarged pulmonary vessels Aortic Atherosclerosis (ICD10-I70.0) and Emphysema (ICD10-J43.9). Electronically Signed   By: Karen Kays M.D.   On: 07/31/2023 12:06   DG Chest Portable 1 View Result Date: 07/31/2023 CLINICAL DATA:  Dyspnea. EXAM: PORTABLE CHEST 1 VIEW COMPARISON:  07/14/2023 FINDINGS: Low lung volumes. There is pulmonary vascular congestion without overt pulmonary edema. Soft tissue fullness noted in the right hilum. No pleural effusion. No acute bony abnormality. Telemetry leads overlie the chest. IMPRESSION: 1. Low lung volumes with pulmonary vascular congestion. 2. Soft tissue fullness in the right hilum. Lymphadenopathy or central pulmonary lesion not excluded. CT chest with contrast recommended to further evaluate. Electronically Signed   By: Kennith Center M.D.   On: 07/31/2023 07:43    Procedures Procedures    Medications Ordered in ED Medications - No data to display  ED Course/ Medical Decision Making/ A&P                                 Medical Decision Making Amount and/or Complexity of Data Reviewed Labs: ordered. Radiology: ordered.  Risk OTC drugs. Prescription drug management. Decision regarding hospitalization.   Patient presents to the ED with complaints of dyspnea, this involves  an extensive number of treatment options, and is a complaint that carries with it a high risk of complications and morbidity. Nontoxic, vitals w/ HTN similar to prior.  .  Additional history obtained:  Chart/nursing notes reviewed.  External records viewed including:  07/04/23 Echo: EF 30-35%, grade 1 diastolic dysfunction  Prior labs  EKG: Initial: Sinus rhythm Abnormal R-wave progression, late transition Left ventricular hypertrophy Abnormal T, consider ischemia, diffuse leads Poor data quality, interpretation may be adversely affected  Repeat:   Lab Tests:  I viewed & interpreted labs including:  CBC: pancytopenia similar to prior ranges CMP: Cr 1.55, most recently 1.30, has been 1.63 within past 1 month  CK: 723- similar to prior ranges in the setting of cocaine use,  do not suspect significant rhabdomyolysis  Troponin: 96>>> 83 BNP: 685.2  Imaging Studies:  I ordered and viewed the following imaging, agree with radiologist impression:  CXR: 1. Low lung volumes with pulmonary vascular congestion. 2. Soft tissue fullness in the right hilum. Lymphadenopathy or central pulmonary lesion not excluded. CT chest with contrast recommended to further evaluate.  ED Course:  CXR abnormal, recommendation for CT w/ contrast, GFR 54, will proceed   CT chest w/ contrast:   Limited examination by motion in the contrast bolus. Enlarged heart with enlargement of the pulmonary arteries. Emphysematous lung changes identified with some scarring atelectatic changes as well as interstitial septal thickening, similar to previous. There are persistent mildly enlarged mediastinal lymph nodes identified similar to the previous examination. Recommend continued follow up surveillance. The finding by x-ray at the right hilum may be related to the enlarged pulmonary vessels Aortic Atherosclerosis (ICD10-I70.0) and Emphysema  On exam he has no wheezing to raise concern for acute COPD Not tachycardic, low risk wells,  do not suspect PE His troponins are elevated, however similar to prior ranges and downtrending without recurrence of chest pain, ACS seems less likely.  Does have peripheral edema on exam and CT imaging with some enlargement of his pulmonary arteries w/ elevated BNP- suspect acute exacerbation of his CHF in the setting of not taking his outpatient medications and substance use. He was given a dose of his PO Lasix, subsequently attempted to ambulate to assess disposition and patient felt too short of breath to continue more than a few steps. He does not feel he is safe for discharge, states this feels like when he has needed to be admitted previously.   15:10: CONSULT: Discussed with triad hospitalist Dr. Allena Katz, will assess patient for admission.   This is a shared visit with supervising physician Dr. Criss Alvine who has independently evaluated patient & provided guidance in evaluation/management/disposition, in agreement with care   Portions of this note were generated with Dragon dictation software. Dictation errors may occur despite best attempts at proofreading.  Final Clinical Impression(s) / ED Diagnoses Final diagnoses:  Acute congestive heart failure, unspecified heart failure type (HCC)  Chronic kidney disease, unspecified CKD stage  Elevated CK    Rx / DC Orders ED Discharge Orders     None         Cherly Anderson, PA-C 07/31/23 1628    Pricilla Loveless, MD 08/01/23 1256

## 2023-07-31 NOTE — ED Notes (Signed)
Pt refused to ambulate with pulse ox.

## 2023-07-31 NOTE — ED Notes (Signed)
 Nurse went into pt room to complete outstanding tasks. Pt said before we get started could I get something to drink and crackers. Nurse explained to pt that his orders was hours overdue due to his refusing earlier and that I would be happy to get him the items he is asking for after his care is completed.

## 2023-07-31 NOTE — ED Notes (Signed)
Pt refusing any blood draws at this time.

## 2023-07-31 NOTE — ED Notes (Signed)
Patient given sandwich bag. 

## 2023-07-31 NOTE — TOC Initial Note (Signed)
 Transition of Care (TOC) - Initial/Assessment Note    Patient Details  Name: Charles [name]. MRN: [id number] Date of Birth: June 22, 1970  Transition of Care [facility]) CM/SW Contact:    Lucretia Field, LCSW Phone Number: 07/31/2023, 7:15 PM  Clinical Narrative:                 Patient is a 53 year old male, homeless, daily cocaine usage, non compliant with medications, presenting with Altered Mental Status, patient was irritated, he was not able to sit still in his bed screaming "I'm in pain I'm in pain I need medications". Patient did not want to speak to SW. Patient has CHF,chronic systolic and diastolic heart failure reduced EF 30 to 35%, essential hypertension, CKD stage IIIb.          Patient Goals and CMS Choice            Expected Discharge Plan and Services                                              Prior Living Arrangements/Services                       Activities of Daily Living   ADL Screening (condition at time of admission) Independently performs ADLs?: Yes (appropriate for developmental age) Is the patient deaf or have difficulty hearing?: No Does the patient have difficulty seeing, even when wearing glasses/contacts?: No Does the patient have difficulty concentrating, remembering, or making decisions?: No  Permission Sought/Granted                  Emotional Assessment              Admission diagnosis:  SOB (shortness of breath) [R06.02] Patient Active Problem List   Diagnosis Date Noted   Altered mental status, unspecified 07/14/2023   Anemia of chronic disease 07/04/2023   Acute on chronic systolic (congestive) heart failure (HCC) 07/03/2023   Acute on chronic combined systolic and diastolic CHF (congestive heart failure) (HCC) 06/20/2023   Chronic anemia 06/20/2023   Rhabdomyolysis 06/08/2023   Acute exacerbation of CHF (congestive heart failure) (HCC) 05/29/2023   Acute on chronic HFrEF (heart failure with  reduced ejection fraction) (HCC) 05/08/2023   Nausea vomiting and diarrhea 04/14/2023   Macrocytic anemia 02/22/2023   CKD stage 3b, GFR 30-44 ml/min (HCC) 02/22/2023   Acute on chronic systolic CHF (congestive heart failure) (HCC) 01/27/2023   Prolonged QT interval 01/27/2023   Elevated blood pressure reading with diagnosis of hypertension 11/27/2022   Hypertensive crisis 11/26/2022   Generalized weakness 11/26/2022   Laceration of hand 11/16/2022   COPD (chronic obstructive pulmonary disease) (HCC) 10/03/2022   CHF (congestive heart failure) (HCC) 10/02/2022   Acute hypoxic respiratory failure (HCC) 09/03/2022   Chronic pain 08/13/2022   Acute on chronic congestive heart failure (HCC) 08/10/2022   Essential hypertension 07/22/2022   SOB (shortness of breath) 07/21/2022   Acute on chronic systolic congestive heart failure (HCC) 02/28/2022   Elevated troponin 02/28/2022   CKD (chronic kidney disease), stage II 02/28/2022   Cocaine abuse (HCC) 02/28/2022   Bacteremia 05/27/2021   Chronic diastolic CHF (congestive heart failure) (HCC) 05/27/2021   Hypertensive urgency 05/26/2021   Polysubstance abuse (HCC) 05/26/2021   COPD with acute exacerbation (HCC) 05/26/2021   Chest pain 05/26/2021  Chronic hepatitis C (HCC) 05/26/2021   Homelessness 05/26/2021   Seizure (HCC) 06/06/2020   Hypokalemia 06/22/2019   Splenic laceration, initial encounter 08/02/2016   Thrombocytopenia (HCC) 10/19/2014   Tobacco abuse 10/19/2014   Acute kidney injury superimposed on chronic kidney disease (HCC) 10/19/2014   PCP:  Renaye Rakers, MD Pharmacy:   [facility] [address] [place] [address] Phone: [phone] Fax: [phone]  [place] [facility] [address] [place] [address] Phone: [phone] Fax: [phone]  [facility] [address] [place] [address] Phone:  [phone] Fax: [phone]     Social Drivers of Health (SDOH) Social History: SDOH Screenings   Food Insecurity: Food Insecurity Present (07/31/2023)  Housing: High Risk (07/31/2023)  Transportation Needs: Unmet Transportation Needs (07/31/2023)  Utilities: At Risk (07/31/2023)  Alcohol Screen: Medium Risk (02/24/2023)  Financial Resource Strain: Low Risk  (07/07/2023)  Social Connections: Unknown (09/24/2021)   [facility] from [facility]  Tobacco Use: High Risk (07/31/2023)   SDOH Interventions:     Readmission Risk Interventions    06/23/2023    4:32 PM 01/29/2023    8:43 AM 10/04/2022   11:27 AM  Readmission Risk Prevention Plan  Transportation Screening Complete Complete Complete  Medication Review Oceanographer) Complete Complete Complete  PCP or Specialist appointment within 3-5 days of discharge Complete Complete Complete  HRI or Home Care Consult Complete Complete Complete  SW Recovery Care/Counseling Consult Complete  Complete  Palliative Care Screening Not Applicable Not Applicable Not Applicable  Skilled Nursing Facility Not Applicable Not Applicable Not Applicable   Lily Peer, MSW, LCSWA Transition of Care  Clinical Social Worker (ED 3-11 Mon-Fri)  [phone]

## 2023-07-31 NOTE — ED Notes (Addendum)
 Pt refusing to cooperate with further nursing assesment.   When asked suicide questionnaire pt became agitated ,"I am not answering the same damn questions any more, its aggravating".   Earlier pt. had become irritated with physical assessment/vitals "why are you taking my tempeture again, the last nurse just did that."

## 2023-07-31 NOTE — ED Triage Notes (Signed)
 Pt bibgcems pt called stated he has sob and concerned about  leg swelling. Pt admitted to cocaine use 45 mins before ems arrival. Pt c/o of pain allover and inability to stay still.  Bp 20/120 93 hr  98% ra

## 2023-07-31 NOTE — ED Notes (Signed)
 Report given to [facility] RN, no questions at time.

## 2023-07-31 NOTE — ED Notes (Signed)
 Assumed pt care, it was passed off from last shift RN that pt has been refusing care and lab draw. It was passed off that pt refused to leave the hospital.

## 2023-07-31 NOTE — ED Notes (Signed)
 Attempted to get temp and vs but pt was asleep. Pt is resting.

## 2023-08-01 DIAGNOSIS — M6282 Rhabdomyolysis: Secondary | ICD-10-CM | POA: Diagnosis not present

## 2023-08-01 DIAGNOSIS — F1410 Cocaine abuse, uncomplicated: Secondary | ICD-10-CM

## 2023-08-01 DIAGNOSIS — Z720 Tobacco use: Secondary | ICD-10-CM | POA: Diagnosis not present

## 2023-08-01 DIAGNOSIS — J42 Unspecified chronic bronchitis: Secondary | ICD-10-CM | POA: Diagnosis not present

## 2023-08-01 DIAGNOSIS — I5023 Acute on chronic systolic (congestive) heart failure: Secondary | ICD-10-CM

## 2023-08-01 DIAGNOSIS — N182 Chronic kidney disease, stage 2 (mild): Secondary | ICD-10-CM

## 2023-08-01 DIAGNOSIS — I10 Essential (primary) hypertension: Secondary | ICD-10-CM

## 2023-08-01 DIAGNOSIS — D649 Anemia, unspecified: Secondary | ICD-10-CM | POA: Diagnosis not present

## 2023-08-01 LAB — CBC WITH DIFFERENTIAL/PLATELET
Abs Immature Granulocytes: 0.01 10*3/uL (ref 0.00–0.07)
Basophils Absolute: 0 10*3/uL (ref 0.0–0.1)
Basophils Relative: 0 %
Eosinophils Absolute: 0 10*3/uL (ref 0.0–0.5)
Eosinophils Relative: 1 %
HCT: 38.7 % — ABNORMAL LOW (ref 39.0–52.0)
Hemoglobin: 12.8 g/dL — ABNORMAL LOW (ref 13.0–17.0)
Immature Granulocytes: 0 %
Lymphocytes Relative: 32 %
Lymphs Abs: 1.2 10*3/uL (ref 0.7–4.0)
MCH: 34.2 pg — ABNORMAL HIGH (ref 26.0–34.0)
MCHC: 33.1 g/dL (ref 30.0–36.0)
MCV: 103.5 fL — ABNORMAL HIGH (ref 80.0–100.0)
Monocytes Absolute: 0.6 10*3/uL (ref 0.1–1.0)
Monocytes Relative: 16 %
Neutro Abs: 1.8 10*3/uL (ref 1.7–7.7)
Neutrophils Relative %: 51 %
Platelets: 142 10*3/uL — ABNORMAL LOW (ref 150–400)
RBC: 3.74 MIL/uL — ABNORMAL LOW (ref 4.22–5.81)
RDW: 17.9 % — ABNORMAL HIGH (ref 11.5–15.5)
WBC: 3.6 10*3/uL — ABNORMAL LOW (ref 4.0–10.5)
nRBC: 0 % (ref 0.0–0.2)

## 2023-08-01 LAB — COMPREHENSIVE METABOLIC PANEL
ALT: 26 U/L (ref 0–44)
AST: 35 U/L (ref 15–41)
Albumin: 3.1 g/dL — ABNORMAL LOW (ref 3.5–5.0)
Alkaline Phosphatase: 59 U/L (ref 38–126)
Anion gap: 7 (ref 5–15)
BUN: 17 mg/dL (ref 6–20)
CO2: 22 mmol/L (ref 22–32)
Calcium: 7.8 mg/dL — ABNORMAL LOW (ref 8.9–10.3)
Chloride: 106 mmol/L (ref 98–111)
Creatinine, Ser: 1.27 mg/dL — ABNORMAL HIGH (ref 0.61–1.24)
GFR, Estimated: >60 mL/min (ref >60)
Glucose, Bld: 84 mg/dL (ref 70–99)
Potassium: 4.5 mmol/L (ref 3.5–5.1)
Sodium: 135 mmol/L (ref 135–145)
Total Bilirubin: 0.8 mg/dL (ref 0.0–1.2)
Total Protein: 6.5 g/dL (ref 6.5–8.1)

## 2023-08-01 LAB — PHOSPHORUS: Phosphorus: 3.3 mg/dL (ref 2.5–4.6)

## 2023-08-01 LAB — MAGNESIUM: Magnesium: 1.9 mg/dL (ref 1.7–2.4)

## 2023-08-01 MED ORDER — EMPAGLIFLOZIN 10 MG PO TABS
10.0000 mg | ORAL_TABLET | Freq: Every day | ORAL | Status: DC
  Administered 2023-08-01 – 2023-08-05 (×5): 10 mg via ORAL
  Filled 2023-08-01 (×5): qty 1

## 2023-08-01 MED ORDER — FUROSEMIDE 10 MG/ML IJ SOLN
60.0000 mg | Freq: Two times a day (BID) | INTRAMUSCULAR | Status: DC
  Administered 2023-08-01 – 2023-08-04 (×6): 60 mg via INTRAVENOUS
  Filled 2023-08-01 (×6): qty 6

## 2023-08-01 MED ORDER — ORAL CARE MOUTH RINSE: 15.0000 mL | OROMUCOSAL | Status: DC | PRN

## 2023-08-01 MED ORDER — GABAPENTIN 100 MG PO CAPS
100.0000 mg | ORAL_CAPSULE | Freq: Two times a day (BID) | ORAL | Status: DC
  Administered 2023-08-01 – 2023-08-05 (×8): 100 mg via ORAL
  Filled 2023-08-01 (×8): qty 1

## 2023-08-01 MED ORDER — CYCLOBENZAPRINE HCL 5 MG PO TABS
5.0000 mg | ORAL_TABLET | Freq: Three times a day (TID) | ORAL | Status: DC | PRN
  Administered 2023-08-02 – 2023-08-04 (×3): 5 mg via ORAL
  Filled 2023-08-01 (×4): qty 1

## 2023-08-01 MED ORDER — LOSARTAN POTASSIUM 25 MG PO TABS
25.0000 mg | ORAL_TABLET | Freq: Every day | ORAL | Status: DC
  Administered 2023-08-01 – 2023-08-05 (×5): 25 mg via ORAL
  Filled 2023-08-01 (×5): qty 1

## 2023-08-01 MED ORDER — SPIRONOLACTONE 25 MG PO TABS
25.0000 mg | ORAL_TABLET | Freq: Every day | ORAL | Status: DC
  Administered 2023-08-01 – 2023-08-05 (×5): 25 mg via ORAL
  Filled 2023-08-01 (×5): qty 1

## 2023-08-01 MED ORDER — AMLODIPINE BESYLATE 10 MG PO TABS
10.0000 mg | ORAL_TABLET | Freq: Every day | ORAL | Status: DC
  Administered 2023-08-01 – 2023-08-05 (×5): 10 mg via ORAL
  Filled 2023-08-01 (×5): qty 1

## 2023-08-01 NOTE — Assessment & Plan Note (Addendum)
 Follow up hg is 12.8 , with thrombocytopenia at 142 and leukopenia at 3.6

## 2023-08-01 NOTE — ED Notes (Signed)
 Patient was given graham crackers and peanut butter and drink.

## 2023-08-01 NOTE — Assessment & Plan Note (Signed)
 Cessation counseling

## 2023-08-01 NOTE — Assessment & Plan Note (Addendum)
 Ck 729 Follow up Ck 218, with resolution of rhabdomyolysis.

## 2023-08-01 NOTE — ED Notes (Signed)
Pt refuses to keep monitors on.

## 2023-08-01 NOTE — Care Management Obs Status (Signed)
 MEDICARE OBSERVATION STATUS NOTIFICATION   Patient Details  Name: Charles [name]. MRN: [id number] Date of Birth: [birth year]   Medicare Observation Status Notification Given:  Yes    Kizzie Ide Chezney Huether, RN 08/01/2023, 9:53 AM

## 2023-08-01 NOTE — Assessment & Plan Note (Addendum)
 Echocardiogram with reduced LV systolic function with EF 30 to 35%, moderate LVH, RV with moderate reduction in systolic function, moderate increased right ventricular wall thickness. No significant valvular disease.   Urine output 5,575 ml  Systolic blood pressure 100 range.   Continue furosemide 60 mg IV bid.  Continue with empagliflozin, losartan and spironolactone No B blocker due to low EF.   Troponin elevation due to heart failure exacerbation. No acute coronary syndrome.

## 2023-08-01 NOTE — Assessment & Plan Note (Addendum)
 Patient with increased cough and sputum production, Plan to add antitussive agents and bronchodilator therapy.  Possible mild exacerbation.

## 2023-08-01 NOTE — Assessment & Plan Note (Signed)
 Smoking cessation

## 2023-08-01 NOTE — Assessment & Plan Note (Addendum)
 Continue blood pressure control with amlodipine, losartan and diuresis with spironolactone.

## 2023-08-01 NOTE — Hospital Course (Addendum)
 Mr. Eckardt was admitted to the hospital with the working diagnosis of  heart failure exacerbation.   53 yo male with the past medical history of heart failure, hypertension, and cocaine use who presented with dyspnea. Recent hospitalization 03/03 to 07/15/23 for cocaine intoxication with metabolic encephalopathy. At home apparently he continue using cocaine. He developed worsening dyspnea, lower extremity edema and myalgias, prompting him to come back to the hospital. On his initial physical examination his blood pressure was 149/99, HR 87, RR 18 and 02 saturation 97%.  Lungs with no wheezing or rhonchi, heart with S1 and S2 present and regular, no rubs, or gallops, respiratory with no wheezing or rhonchi, abdomen with no distention and positive lower extremity edema.   Na 138, K 3,9 CL 107 bicarbonate 21, glucose 125, bun 21 cr 1,55  BNP 685 and 1054  Ck 729  High sensitive troponin 96, 83, 90  Wbc 3,9 hgb 11,1 plt 117   Chest radiograph with hypoinflation, positive cardiomegaly, with bilateral hilar vascular congestion, no effusions.  CT chest with bilateral ground glass opacities and increased septal thickening with bilateral atelectasis. Mild enlarged mediastinal lymph nodes. Enlargement of pulmonary arteries.   EKG 90 bpm, normal axis, normal intervals, qtc 496, sinus rhythm with poor R R wave progression with no significant ST segment or T wave changes.  Positive LVH.   Patient was placed on furosemide for diuresis.   03/23 volume status is improving. Follow up chest radiograph with improvement in pulmonary edema.  03/24 patient clinically euvolemic, pending renal function to be more stable.  03/25  renal function now more stable, patient continue to improve clinically.  Plan for discharge home and have close follow up as outpatient.

## 2023-08-01 NOTE — Progress Notes (Signed)
  Progress Note   Patient: Charles [name]. [id number] DOB: 10-24-1970 DOA: 07/31/2023     0 DOS: the patient was seen and examined on 08/01/2023   Brief hospital course: Charles [name] was admitted to the hospital with the working diagnosis of  heart failure exacerbation.   53 yo male with the past medical history of heart failure, hypertension, and cocaine use who presented with dyspnea. Recent hospitalization 03/03 to 07/15/23 for cocaine intoxication with metabolic encephalopathy. At home apparently he continue using cocaine. He developed worsening dyspnea, lower extremity edema and myalgias, prompting him to come back to the hospital. On his initial physical examination his blood pressure was 149/99, HR 87, RR 18 and 02 saturation 97%.  Lungs with no wheezing or rhonchi, heart with S1 and S2 present and regular, no rubs, or gallops, respiratory with no wheezing or rhonchi, abdomen with no distention and positive lower extremity edema.   Assessment and Plan: * Acute on chronic systolic CHF (congestive heart failure) (HCC) Echocardiogram with reduced LV systolic function with EF 30 to 35%, moderate LVH, RV with moderate reduction in systolic function, moderate increased right ventricular wall thickness. No significant valvular disease.   Urine output 3,050  Systolic blood pressure 130 range.   Plan to continue diuresis with furosemide, increase dose to 60 mg IV bid.  Resume empagliflozin, losartan and spironolactone No B blocker due to low EF.   Troponin elevation due to heart failure exacerbation. No acute coronary syndrome.   Essential hypertension Continue blood pressure control with amlodipine, losartan and diuresis with spironolactone and IV furosemide.   Cocaine abuse (HCC) Cessation counseling   Rhabdomyolysis Ck 729, will follow up Ck in am, considering volume overload, will hold on IV fluids.  Ck values less than 5000 to 3000 less risk of kidney injury.   CKD (chronic  kidney disease), stage II Renal function with serum cr at 1,27 with K at 4,5 and serum bicarbonate at 22,  Na 135 and Mg 1.9   Plan to continue diuresis with furosemide, follow up renal function and electrolytes.  Avoid hypotension and nephrotoxic medications.,   Tobacco abuse Smoking cessation   Chronic anemia Follow up hg is 12.8 , with thrombocytopenia at 142 and leukopenia at 3.6  Follow cell count in 48 hrs.   COPD (chronic obstructive pulmonary disease) (HCC) No signs of acute exacerbation,         Subjective: Patient with intermittent cramps, with dyspnea and edema, no nausea or vomiting.   Physical Exam: Vitals:   07/31/23 1245 07/31/23 2122 08/01/23 0415 08/01/23 0504  BP: (!) 130/112 (!) 154/114  (!) 164/117  Pulse: 88 71  90  Resp: 17 14 16 17   Temp:  98.8 F (37.1 C)  98.7 F (37.1 C)  TempSrc:  Oral  Oral  SpO2: 98% 95%  96%   Neurology awake and alert, deconditioned and ill looking appearing ENT with mild pallor Cardiovascular with S1 and S2 present and regular with no gallops, or rubs, no murmurs Positive JVD Lower extremity edema +  Respiratory with bilateral rales with no wheezing or rhonchi Abdomen with no distention  Data Reviewed:    Family Communication: no family at the bedside   Disposition: Status is: Observation The patient will require care spanning > 2 midnights and should be moved to inpatient because: IV diuresis   Planned Discharge Destination: Home    Author: Coralie Keens, MD 08/01/2023 7:41 AM  For on call review [email or web address].

## 2023-08-01 NOTE — Assessment & Plan Note (Addendum)
 Volume status has improved, renal function with serum cr at 1,41 with K at 4,1 with serum bicarbonate 26  Na 136 Mg 2.2   Add 20 KCl to avoid hypokalemia.  Plan to continue diuresis with furosemide, follow up renal function and electrolytes.  Avoid hypotension and nephrotoxic medications.,

## 2023-08-02 DIAGNOSIS — I130 Hypertensive heart and chronic kidney disease with heart failure and stage 1 through stage 4 chronic kidney disease, or unspecified chronic kidney disease: Secondary | ICD-10-CM | POA: Diagnosis present

## 2023-08-02 DIAGNOSIS — I2489 Other forms of acute ischemic heart disease: Secondary | ICD-10-CM | POA: Diagnosis present

## 2023-08-02 DIAGNOSIS — D61818 Other pancytopenia: Secondary | ICD-10-CM | POA: Diagnosis present

## 2023-08-02 DIAGNOSIS — R0602 Shortness of breath: Secondary | ICD-10-CM | POA: Diagnosis present

## 2023-08-02 DIAGNOSIS — J42 Unspecified chronic bronchitis: Secondary | ICD-10-CM | POA: Diagnosis not present

## 2023-08-02 DIAGNOSIS — Z801 Family history of malignant neoplasm of trachea, bronchus and lung: Secondary | ICD-10-CM | POA: Diagnosis not present

## 2023-08-02 DIAGNOSIS — Z1152 Encounter for screening for COVID-19: Secondary | ICD-10-CM | POA: Diagnosis not present

## 2023-08-02 DIAGNOSIS — Z79899 Other long term (current) drug therapy: Secondary | ICD-10-CM | POA: Diagnosis not present

## 2023-08-02 DIAGNOSIS — J9811 Atelectasis: Secondary | ICD-10-CM | POA: Diagnosis present

## 2023-08-02 DIAGNOSIS — I509 Heart failure, unspecified: Secondary | ICD-10-CM

## 2023-08-02 DIAGNOSIS — Z91014 Allergy to mammalian meats: Secondary | ICD-10-CM | POA: Diagnosis not present

## 2023-08-02 DIAGNOSIS — Z91012 Allergy to eggs: Secondary | ICD-10-CM | POA: Diagnosis not present

## 2023-08-02 DIAGNOSIS — F1910 Other psychoactive substance abuse, uncomplicated: Secondary | ICD-10-CM | POA: Diagnosis present

## 2023-08-02 DIAGNOSIS — Z91018 Allergy to other foods: Secondary | ICD-10-CM | POA: Diagnosis not present

## 2023-08-02 DIAGNOSIS — D696 Thrombocytopenia, unspecified: Secondary | ICD-10-CM | POA: Diagnosis present

## 2023-08-02 DIAGNOSIS — N1832 Chronic kidney disease, stage 3b: Secondary | ICD-10-CM | POA: Diagnosis present

## 2023-08-02 DIAGNOSIS — J449 Chronic obstructive pulmonary disease, unspecified: Secondary | ICD-10-CM | POA: Diagnosis present

## 2023-08-02 DIAGNOSIS — I5023 Acute on chronic systolic (congestive) heart failure: Secondary | ICD-10-CM | POA: Diagnosis present

## 2023-08-02 DIAGNOSIS — Z720 Tobacco use: Secondary | ICD-10-CM | POA: Diagnosis not present

## 2023-08-02 DIAGNOSIS — M6282 Rhabdomyolysis: Secondary | ICD-10-CM | POA: Diagnosis present

## 2023-08-02 DIAGNOSIS — N182 Chronic kidney disease, stage 2 (mild): Secondary | ICD-10-CM | POA: Diagnosis not present

## 2023-08-02 DIAGNOSIS — F1410 Cocaine abuse, uncomplicated: Secondary | ICD-10-CM | POA: Diagnosis present

## 2023-08-02 DIAGNOSIS — D72819 Decreased white blood cell count, unspecified: Secondary | ICD-10-CM | POA: Diagnosis present

## 2023-08-02 DIAGNOSIS — I10 Essential (primary) hypertension: Secondary | ICD-10-CM | POA: Diagnosis not present

## 2023-08-02 DIAGNOSIS — B182 Chronic viral hepatitis C: Secondary | ICD-10-CM | POA: Diagnosis present

## 2023-08-02 DIAGNOSIS — N179 Acute kidney failure, unspecified: Secondary | ICD-10-CM | POA: Diagnosis present

## 2023-08-02 DIAGNOSIS — R590 Localized enlarged lymph nodes: Secondary | ICD-10-CM | POA: Diagnosis present

## 2023-08-02 DIAGNOSIS — D649 Anemia, unspecified: Secondary | ICD-10-CM | POA: Diagnosis present

## 2023-08-02 DIAGNOSIS — I517 Cardiomegaly: Secondary | ICD-10-CM | POA: Diagnosis not present

## 2023-08-02 DIAGNOSIS — Z7984 Long term (current) use of oral hypoglycemic drugs: Secondary | ICD-10-CM | POA: Diagnosis not present

## 2023-08-02 LAB — BASIC METABOLIC PANEL
Anion gap: 9 (ref 5–15)
BUN: 30 mg/dL — ABNORMAL HIGH (ref 6–20)
CO2: 26 mmol/L (ref 22–32)
Calcium: 9.1 mg/dL (ref 8.9–10.3)
Chloride: 101 mmol/L (ref 98–111)
Creatinine, Ser: 1.41 mg/dL — ABNORMAL HIGH (ref 0.61–1.24)
GFR, Estimated: 60 mL/min — ABNORMAL LOW (ref >60)
Glucose, Bld: 131 mg/dL — ABNORMAL HIGH (ref 70–99)
Potassium: 4.1 mmol/L (ref 3.5–5.1)
Sodium: 136 mmol/L (ref 135–145)

## 2023-08-02 LAB — MAGNESIUM: Magnesium: 2.2 mg/dL (ref 1.7–2.4)

## 2023-08-02 LAB — CK: Total CK: 218 U/L (ref 49–397)

## 2023-08-02 NOTE — Progress Notes (Signed)
 Progress Note   Patient: Charles [name]. [id number] DOB: 1970/11/24 DOA: 07/31/2023     0 DOS: the patient was seen and examined on 08/02/2023   Brief hospital course: Mr. Tay was admitted to the hospital with the working diagnosis of  heart failure exacerbation.   53 yo male with the past medical history of heart failure, hypertension, and cocaine use who presented with dyspnea. Recent hospitalization 03/03 to 07/15/23 for cocaine intoxication with metabolic encephalopathy. At home apparently he continue using cocaine. He developed worsening dyspnea, lower extremity edema and myalgias, prompting him to come back to the hospital. On his initial physical examination his blood pressure was 149/99, HR 87, RR 18 and 02 saturation 97%.  Lungs with no wheezing or rhonchi, heart with S1 and S2 present and regular, no rubs, or gallops, respiratory with no wheezing or rhonchi, abdomen with no distention and positive lower extremity edema.   Na 138, K 3,9 CL 107 bicarbonate 21, glucose 125, bun 21 cr 1,55  BNP 685 and 1054  Ck 729  High sensitive troponin 96, 83, 90  Wbc 3,9 hgb 11,1 plt 117   Chest radiograph with hypoinflation, positive cardiomegaly, with bilateral hilar vascular congestion, no effusions.  CT chest with bilateral ground glass opacities and increased septal thickening with bilateral atelectasis. Mild enlarged mediastinal lymph nodes. Enlargement of pulmonary arteries.   EKG 90 bpm, normal axis, normal intervals, qtc 496, sinus rhythm with poor R R wave progression with no significant ST segment or T wave changes.  Positive LVH.   Patient was placed on furosemide for diuresis.   Assessment and Plan: * Acute on chronic systolic CHF (congestive heart failure) (HCC) Echocardiogram with reduced LV systolic function with EF 30 to 35%, moderate LVH, RV with moderate reduction in systolic function, moderate increased right ventricular wall thickness. No significant valvular  disease.   Urine output 5,575 ml  Systolic blood pressure 100 range.   Continue furosemide 60 mg IV bid.  Continue with empagliflozin, losartan and spironolactone No B blocker due to low EF.   Troponin elevation due to heart failure exacerbation. No acute coronary syndrome.   Essential hypertension Continue blood pressure control with amlodipine, losartan and diuresis with spironolactone and IV furosemide.   Cocaine abuse (HCC) Cessation counseling   Rhabdomyolysis Ck 729 Follow up Ck 218, continue to hold on IV fluids.   CKD (chronic kidney disease), stage II Volume status has improved, renal function with serum cr at 1,41 with K at 4,1 with serum bicarbonate 26  Na 136 Mg 2.2   Add 20 KCl to avoid hypokalemia.  Plan to continue diuresis with furosemide, follow up renal function and electrolytes.  Avoid hypotension and nephrotoxic medications.,   Tobacco abuse Smoking cessation   Chronic anemia Follow up hg is 12.8 , with thrombocytopenia at 142 and leukopenia at 3.6  Follow cell count in 48 hrs.   COPD (chronic obstructive pulmonary disease) (HCC) No signs of acute exacerbation,       Subjective: Patient is feeling better, continue to improve dyspnea with no chest pain, edema also improving.   Physical Exam: Vitals:   08/02/23 0816 08/02/23 0955 08/02/23 1000 08/02/23 1203  BP:  99/65  122/71  Pulse:      Resp: 20   20  Temp: 97.8 F (36.6 C)   97.9 F (36.6 C)  TempSrc: Oral   Oral  SpO2:   97%   Weight:      Height:  Neurology awake and alert ENT with mild pallor Cardiovascular with S1 and S2 present and regular with no gallops, rubs or murmurs Positive mild to moderate JVD Respiratory with mild rales with no wheezing or rhonchi Abdomen with no distention  Trace lower extremity edema  Data Reviewed:    Family Communication: no family at the beside   Disposition: Status is: Observation The patient will require care spanning > 2  midnights and should be moved to inpatient because: IV diuresis   Planned Discharge Destination: Home     Author: Coralie Keens, MD 08/02/2023 12:08 PM  For on call review [email or web address].

## 2023-08-03 ENCOUNTER — Inpatient Hospital Stay (HOSPITAL_COMMUNITY)

## 2023-08-03 DIAGNOSIS — I5023 Acute on chronic systolic (congestive) heart failure: Secondary | ICD-10-CM | POA: Diagnosis not present

## 2023-08-03 DIAGNOSIS — N182 Chronic kidney disease, stage 2 (mild): Secondary | ICD-10-CM | POA: Diagnosis not present

## 2023-08-03 DIAGNOSIS — I10 Essential (primary) hypertension: Secondary | ICD-10-CM | POA: Diagnosis not present

## 2023-08-03 DIAGNOSIS — F1410 Cocaine abuse, uncomplicated: Secondary | ICD-10-CM | POA: Diagnosis not present

## 2023-08-03 LAB — BASIC METABOLIC PANEL
Anion gap: 8 (ref 5–15)
BUN: 32 mg/dL — ABNORMAL HIGH (ref 6–20)
CO2: 25 mmol/L (ref 22–32)
Calcium: 8.9 mg/dL (ref 8.9–10.3)
Chloride: 103 mmol/L (ref 98–111)
Creatinine, Ser: 1.56 mg/dL — ABNORMAL HIGH (ref 0.61–1.24)
GFR, Estimated: 53 mL/min — ABNORMAL LOW (ref >60)
Glucose, Bld: 156 mg/dL — ABNORMAL HIGH (ref 70–99)
Potassium: 4.4 mmol/L (ref 3.5–5.1)
Sodium: 136 mmol/L (ref 135–145)

## 2023-08-03 LAB — MAGNESIUM: Magnesium: 2.4 mg/dL (ref 1.7–2.4)

## 2023-08-03 MED ORDER — IPRATROPIUM-ALBUTEROL 0.5-2.5 (3) MG/3ML IN SOLN
3.0000 mL | Freq: Four times a day (QID) | RESPIRATORY_TRACT | Status: DC
  Administered 2023-08-03 – 2023-08-04 (×3): 3 mL via RESPIRATORY_TRACT
  Filled 2023-08-03 (×4): qty 3

## 2023-08-03 MED ORDER — GUAIFENESIN-DM 100-10 MG/5ML PO SYRP
5.0000 mL | ORAL_SOLUTION | Freq: Four times a day (QID) | ORAL | Status: DC
  Administered 2023-08-03 – 2023-08-05 (×8): 5 mL via ORAL
  Filled 2023-08-03 (×8): qty 5

## 2023-08-03 MED ORDER — GUAIFENESIN-DM 100-10 MG/5ML PO SYRP: 5.0000 mL | ORAL_SOLUTION | ORAL | Status: DC | PRN

## 2023-08-03 NOTE — Progress Notes (Signed)
 Mobility Specialist Progress Note:   08/03/23 1530  Mobility  Activity Ambulated independently in hallway  Level of Assistance Modified independent, requires aide device or extra time  Assistive Device Cane  Distance Ambulated (ft) 400 ft  Activity Response Tolerated well  Mobility Referral Yes  Mobility visit 1 Mobility  Mobility Specialist Start Time (ACUTE ONLY) 1530  Mobility Specialist Stop Time (ACUTE ONLY) 1548  Mobility Specialist Time Calculation (min) (ACUTE ONLY) 18 min   Pt eager for mobility session. Required no physical assistance to ambulate with cane. Pt c/o minor SOB and R hip pain with exertion. Left sitting EOB with all needs met.   Addison Lank Mobility Specialist Please contact via SecureChat or  Rehab office at [phone]

## 2023-08-03 NOTE — Plan of Care (Signed)
  Problem: Health Behavior/Discharge Planning: Goal: Ability to manage health-related needs will improve Outcome: Progressing   Problem: Clinical Measurements: Goal: Ability to maintain clinical measurements within normal limits will improve Outcome: Progressing   Problem: Activity: Goal: Capacity to carry out activities will improve Outcome: Progressing

## 2023-08-03 NOTE — Progress Notes (Signed)
 Progress Note   Patient: Charles [name]. [id number] DOB: 05/18/1970 DOA: 07/31/2023     1 DOS: the patient was seen and examined on 08/03/2023   Brief hospital course: Mr. Belcastro was admitted to the hospital with the working diagnosis of  heart failure exacerbation.   53 yo male with the past medical history of heart failure, hypertension, and cocaine use who presented with dyspnea. Recent hospitalization 03/03 to 07/15/23 for cocaine intoxication with metabolic encephalopathy. At home apparently he continue using cocaine. He developed worsening dyspnea, lower extremity edema and myalgias, prompting him to come back to the hospital. On his initial physical examination his blood pressure was 149/99, HR 87, RR 18 and 02 saturation 97%.  Lungs with no wheezing or rhonchi, heart with S1 and S2 present and regular, no rubs, or gallops, respiratory with no wheezing or rhonchi, abdomen with no distention and positive lower extremity edema.   Na 138, K 3,9 CL 107 bicarbonate 21, glucose 125, bun 21 cr 1,55  BNP 685 and 1054  Ck 729  High sensitive troponin 96, 83, 90  Wbc 3,9 hgb 11,1 plt 117   Chest radiograph with hypoinflation, positive cardiomegaly, with bilateral hilar vascular congestion, no effusions.  CT chest with bilateral ground glass opacities and increased septal thickening with bilateral atelectasis. Mild enlarged mediastinal lymph nodes. Enlargement of pulmonary arteries.   EKG 90 bpm, normal axis, normal intervals, qtc 496, sinus rhythm with poor R R wave progression with no significant ST segment or T wave changes.  Positive LVH.   Patient was placed on furosemide for diuresis.   03/23 volume status is improving. Follow up chest radiograph with improvement in pulmonary edema.   Assessment and Plan: * Acute on chronic systolic CHF (congestive heart failure) (HCC) Echocardiogram with reduced LV systolic function with EF 30 to 35%, moderate LVH, RV with moderate reduction in  systolic function, moderate increased right ventricular wall thickness. No significant valvular disease.   Urine output 2,500  ml  Systolic blood pressure 100 range.   Continue furosemide 60 mg IV bid, possible transition to po loop diuretic tomorrow.  Continue with empagliflozin, losartan and spironolactone No B blocker due to low EF.   Troponin elevation due to heart failure exacerbation. No acute coronary syndrome.   Essential hypertension Continue blood pressure control with amlodipine, losartan and diuresis with spironolactone and IV furosemide.   Cocaine abuse (HCC) Cessation counseling   Rhabdomyolysis Ck 729 Follow up Ck 218, continue to hold on IV fluids.   CKD (chronic kidney disease), stage II Renal function with serum cr at 1,56 with K at 4,4 and serum bicarbonate at 25  Na 136 Mg 2.4   Plan to continue diuresis with furosemide, follow up renal function and electrolytes.  Avoid hypotension and nephrotoxic medications.,   Tobacco abuse Smoking cessation   Chronic anemia Follow up hg is 12.8 , with thrombocytopenia at 142 and leukopenia at 3.6    COPD (chronic obstructive pulmonary disease) (HCC) Patient with increased cough and sputum production, Plan to add antitussive agents and bronchodilator therapy.  Possible mild exacerbation.       Subjective: Patient with persistent cough and productive phlegm, no nausea or vomiting, edema is improving.   Physical Exam: Vitals:   08/02/23 2000 08/03/23 0122 08/03/23 0430 08/03/23 0734  BP: 108/78 (!) 146/98 119/81 (!) 114/91  Pulse:  82 72 87  Resp:  18 17 20   Temp: 97.8 F (36.6 C) 98.1 F (36.7 C) 98.3 F (  36.8 C) 98.6 F (37 C)  TempSrc: Oral Oral Oral Oral  SpO2:  98% 98% 99%  Weight:   71.8 kg   Height:       Neurology awake and alert ENT with no pallor Cardiovascular with S1 and S2 present and regular with no gallops, rubs or murmurs Mild JVD No lower extremity edema Respiratory with scattered  rhonchi with no wheezing or rales Abdomen with no distention  Data Reviewed:    Family Communication: no family at the bedside   Disposition: Status is: Inpatient Remains inpatient appropriate because: IV diuresis   Planned Discharge Destination: Home     Author: Coralie Keens, MD 08/03/2023 2:19 PM  For on call review [email or web address].

## 2023-08-04 DIAGNOSIS — F1410 Cocaine abuse, uncomplicated: Secondary | ICD-10-CM | POA: Diagnosis not present

## 2023-08-04 DIAGNOSIS — I5023 Acute on chronic systolic (congestive) heart failure: Secondary | ICD-10-CM | POA: Diagnosis not present

## 2023-08-04 DIAGNOSIS — I10 Essential (primary) hypertension: Secondary | ICD-10-CM | POA: Diagnosis not present

## 2023-08-04 DIAGNOSIS — N182 Chronic kidney disease, stage 2 (mild): Secondary | ICD-10-CM | POA: Diagnosis not present

## 2023-08-04 LAB — BASIC METABOLIC PANEL
Anion gap: 9 (ref 5–15)
BUN: 40 mg/dL — ABNORMAL HIGH (ref 6–20)
CO2: 25 mmol/L (ref 22–32)
Calcium: 9.1 mg/dL (ref 8.9–10.3)
Chloride: 101 mmol/L (ref 98–111)
Creatinine, Ser: 1.7 mg/dL — ABNORMAL HIGH (ref 0.61–1.24)
GFR, Estimated: 48 mL/min — ABNORMAL LOW (ref >60)
Glucose, Bld: 103 mg/dL — ABNORMAL HIGH (ref 70–99)
Potassium: 4 mmol/L (ref 3.5–5.1)
Sodium: 135 mmol/L (ref 135–145)

## 2023-08-04 LAB — MAGNESIUM: Magnesium: 2.5 mg/dL — ABNORMAL HIGH (ref 1.7–2.4)

## 2023-08-04 MED ORDER — IPRATROPIUM-ALBUTEROL 0.5-2.5 (3) MG/3ML IN SOLN
3.0000 mL | Freq: Two times a day (BID) | RESPIRATORY_TRACT | Status: DC
  Administered 2023-08-04: 3 mL via RESPIRATORY_TRACT
  Filled 2023-08-04 (×2): qty 3

## 2023-08-04 NOTE — Progress Notes (Signed)
 Heart Failure Navigator Progress Note  Assessed for Heart & Vascular TOC clinic readiness.  Patient does not meet criteria due to per Dr. Ella Jubilee no HF TOC. .   Navigator will sign off at this time.   Rhae Hammock, BSN, Scientist, clinical (histocompatibility and immunogenetics) Only

## 2023-08-04 NOTE — Discharge Instructions (Signed)

## 2023-08-04 NOTE — Plan of Care (Signed)

## 2023-08-04 NOTE — Plan of Care (Signed)
 ?  Problem: Clinical Measurements: ?Goal: Will remain free from infection ?Outcome: Progressing ?  ?

## 2023-08-04 NOTE — Progress Notes (Signed)
 Progress Note   Patient: Charles [name]. [id number] DOB: Mar 26, 1971 DOA: 07/31/2023     2 DOS: the patient was seen and examined on 08/04/2023   Brief hospital course: Mr. Goldston was admitted to the hospital with the working diagnosis of  heart failure exacerbation.   53 yo male with the past medical history of heart failure, hypertension, and cocaine use who presented with dyspnea. Recent hospitalization 03/03 to 07/15/23 for cocaine intoxication with metabolic encephalopathy. At home apparently he continue using cocaine. He developed worsening dyspnea, lower extremity edema and myalgias, prompting him to come back to the hospital. On his initial physical examination his blood pressure was 149/99, HR 87, RR 18 and 02 saturation 97%.  Lungs with no wheezing or rhonchi, heart with S1 and S2 present and regular, no rubs, or gallops, respiratory with no wheezing or rhonchi, abdomen with no distention and positive lower extremity edema.   Na 138, K 3,9 CL 107 bicarbonate 21, glucose 125, bun 21 cr 1,55  BNP 685 and 1054  Ck 729  High sensitive troponin 96, 83, 90  Wbc 3,9 hgb 11,1 plt 117   Chest radiograph with hypoinflation, positive cardiomegaly, with bilateral hilar vascular congestion, no effusions.  CT chest with bilateral ground glass opacities and increased septal thickening with bilateral atelectasis. Mild enlarged mediastinal lymph nodes. Enlargement of pulmonary arteries.   EKG 90 bpm, normal axis, normal intervals, qtc 496, sinus rhythm with poor R R wave progression with no significant ST segment or T wave changes.  Positive LVH.   Patient was placed on furosemide for diuresis.   03/23 volume status is improving. Follow up chest radiograph with improvement in pulmonary edema.  03/24 patient clinically euvolemic, pending renal function to be more stable.   Assessment and Plan: * Acute on chronic systolic CHF (congestive heart failure) (HCC) Echocardiogram with reduced LV  systolic function with EF 30 to 35%, moderate LVH, RV with moderate reduction in systolic function, moderate increased right ventricular wall thickness. No significant valvular disease.   Urine output 3,400  ml  Systolic blood pressure 130 mmHg range.   Hold on loop diuretic for today.  Continue with empagliflozin, losartan and spironolactone No B blocker due to low EF.   Troponin elevation due to heart failure exacerbation. No acute coronary syndrome.   Essential hypertension Continue blood pressure control with amlodipine, losartan and diuresis with spironolactone.  Cocaine abuse (HCC) Cessation counseling   Rhabdomyolysis Ck 729 Follow up Ck 218, with resolution of rhabdomyolysis.   CKD (chronic kidney disease), stage II AKI,   Renal function with serum cr at 1.70 with K at 4,0 and serum bicarbonate at 25  Na 135 and Mg 2,5   Hold loop diuretic therapy for today and follow up renal function in am.  Patient clinically is euvolemic today.  Avoid hypotension and nephrotoxic medications.,   Tobacco abuse Smoking cessation   Chronic anemia Follow up hg is 12.8 , with thrombocytopenia at 142 and leukopenia at 3.6    COPD (chronic obstructive pulmonary disease) (HCC) Cough has improved.  Continue with antitussive agents and bronchodilator therapy.  Possible mild exacerbation.         Subjective: Patient with improvement in edema, no PND or orthopnea, no chest pain, cough has improved.   Physical Exam: Vitals:   08/04/23 0509 08/04/23 0750 08/04/23 1041 08/04/23 1130  BP: (!) 147/83 (!) 138/102 (!) 143/99 137/89  Pulse: (!) 56 87 74 80  Resp: 18 18  18  Temp: 97.8 F (36.6 C) 97.8 F (36.6 C) 98.1 F (36.7 C) 97.8 F (36.6 C)  TempSrc: Oral Oral Oral Oral  SpO2: 96% 100% 100% 100%  Weight: 72.4 kg     Height:       Neurology awake and alert ENT with no pallor Cardiovascular with S1 and S2 present and regular with no gallops, rubs or murmurs Respiratory  with no rales or wheezing, no rhonchi.  Abdomen with no distention  No lower extremity edema  Data Reviewed:    Family Communication: no family at the bedside   Disposition: Status is: Inpatient Remains inpatient appropriate because: pending renal function to be more stable   Planned Discharge Destination: Home      Author: Coralie Keens, MD 08/04/2023 1:14 PM  For on call review [email or web address].

## 2023-08-04 NOTE — Progress Notes (Signed)
 Mobility Specialist Progress Note:   08/04/23 1110  Mobility  Activity Ambulated independently in hallway  Level of Assistance Modified independent, requires aide device or extra time  Assistive Device Cane  Distance Ambulated (ft) 300 ft  Activity Response Tolerated well  Mobility Referral Yes  Mobility visit 1 Mobility  Mobility Specialist Start Time (ACUTE ONLY) 1110  Mobility Specialist Stop Time (ACUTE ONLY) 1125  Mobility Specialist Time Calculation (min) (ACUTE ONLY) 15 min   Pt agreeable to mobility session. Required no physical assistance throughout ambulation with cane. Pt c/o R hip and R ankle pain. Pt back in bed with all needs met.   Addison Lank Mobility Specialist Please contact via SecureChat or  Rehab office at [phone]

## 2023-08-04 NOTE — Progress Notes (Signed)
 TOC familiar with patient and added food, housing, transportation, and utility resources to patients AVS.  Johnnette Gourd, MSW, LCSWA Transitions of Care [phone]

## 2023-08-04 NOTE — Plan of Care (Signed)
   Problem: Clinical Measurements: Goal: Will remain free from infection Outcome: Progressing Goal: Cardiovascular complication will be avoided Outcome: Progressing

## 2023-08-05 ENCOUNTER — Other Ambulatory Visit (HOSPITAL_COMMUNITY): Payer: Self-pay

## 2023-08-05 DIAGNOSIS — N182 Chronic kidney disease, stage 2 (mild): Secondary | ICD-10-CM | POA: Diagnosis not present

## 2023-08-05 DIAGNOSIS — I10 Essential (primary) hypertension: Secondary | ICD-10-CM | POA: Diagnosis not present

## 2023-08-05 DIAGNOSIS — I5023 Acute on chronic systolic (congestive) heart failure: Secondary | ICD-10-CM | POA: Diagnosis not present

## 2023-08-05 DIAGNOSIS — F1410 Cocaine abuse, uncomplicated: Secondary | ICD-10-CM | POA: Diagnosis not present

## 2023-08-05 LAB — BASIC METABOLIC PANEL
Anion gap: 7 (ref 5–15)
BUN: 39 mg/dL — ABNORMAL HIGH (ref 6–20)
CO2: 23 mmol/L (ref 22–32)
Calcium: 8.9 mg/dL (ref 8.9–10.3)
Chloride: 105 mmol/L (ref 98–111)
Creatinine, Ser: 1.55 mg/dL — ABNORMAL HIGH (ref 0.61–1.24)
GFR, Estimated: 54 mL/min — ABNORMAL LOW (ref >60)
Glucose, Bld: 91 mg/dL (ref 70–99)
Potassium: 4.6 mmol/L (ref 3.5–5.1)
Sodium: 135 mmol/L (ref 135–145)

## 2023-08-05 MED ORDER — LOSARTAN POTASSIUM 25 MG PO TABS
25.0000 mg | ORAL_TABLET | Freq: Every day | ORAL | 0 refills | Status: DC
  Filled 2023-08-05: qty 30, 30d supply, fill #0

## 2023-08-05 MED ORDER — ALBUTEROL SULFATE HFA 108 (90 BASE) MCG/ACT IN AERS
2.0000 | INHALATION_SPRAY | RESPIRATORY_TRACT | 0 refills | Status: DC | PRN
  Filled 2023-08-05: qty 6.7, 17d supply, fill #0

## 2023-08-05 MED ORDER — EMPAGLIFLOZIN 10 MG PO TABS
10.0000 mg | ORAL_TABLET | Freq: Every day | ORAL | 0 refills | Status: DC
  Filled 2023-08-05: qty 30, 30d supply, fill #0

## 2023-08-05 MED ORDER — SALINE SPRAY 0.65 % NA SOLN
1.0000 | NASAL | 0 refills | Status: DC | PRN
  Filled 2023-08-05: qty 30, fill #0

## 2023-08-05 MED ORDER — SPIRONOLACTONE 25 MG PO TABS
25.0000 mg | ORAL_TABLET | Freq: Every day | ORAL | 0 refills | Status: DC
  Filled 2023-08-05: qty 30, 30d supply, fill #0

## 2023-08-05 MED ORDER — SALINE SPRAY 0.65 % NA SOLN: 1.0000 | NASAL | Status: DC | PRN

## 2023-08-05 MED ORDER — FUROSEMIDE 40 MG PO TABS
40.0000 mg | ORAL_TABLET | Freq: Every day | ORAL | 0 refills | Status: DC
  Filled 2023-08-05: qty 30, 30d supply, fill #0

## 2023-08-05 MED ORDER — AMLODIPINE BESYLATE 10 MG PO TABS
10.0000 mg | ORAL_TABLET | Freq: Every day | ORAL | 0 refills | Status: DC
  Filled 2023-08-05: qty 30, 30d supply, fill #0

## 2023-08-05 MED ORDER — GABAPENTIN 100 MG PO CAPS
100.0000 mg | ORAL_CAPSULE | Freq: Two times a day (BID) | ORAL | 0 refills | Status: DC
  Filled 2023-08-05: qty 60, 30d supply, fill #0

## 2023-08-05 NOTE — Discharge Summary (Addendum)
 Physician Discharge Summary   Patient: Charles [name]. MRN: [id number] DOB: 1970/12/19  Admit date:     07/31/2023  Discharge date: 08/05/23  Discharge Physician: York Ram Bradlee Bridgers   PCP: Renaye Rakers, MD   Recommendations at discharge:    Patient was advised to stay adherent to his medical therapy for heart failure, he was explained about risks of not taking medications, including worsening heart failure and death.  All prescriptions were refilled.  Advised to avoid cocaine.  Follow up renal function and electrolytes in 7 days as outpatient.  Follow up with Dr Parke Simmers in 7 to 10 days Follow up with Cardiology as scheduled, he missed multiple heart failure clinic appointments.   Discharge Diagnoses: Principal Problem:   Acute on chronic systolic CHF (congestive heart failure) (HCC) Active Problems:   Essential hypertension   Cocaine abuse (HCC)   CKD (chronic kidney disease), stage II   Rhabdomyolysis   Tobacco abuse   Chronic anemia   COPD (chronic obstructive pulmonary disease) (HCC)  Resolved Problems:   * No resolved hospital problems. [facility] Course: Mr. Kidney was admitted to the hospital with the working diagnosis of  heart failure exacerbation.   53 yo male with the past medical history of heart failure, hypertension, and cocaine use who presented with dyspnea. Recent hospitalization 03/03 to 07/15/23 for cocaine intoxication with metabolic encephalopathy. At home apparently he continue using cocaine. He developed worsening dyspnea, lower extremity edema and myalgias, prompting him to come back to the hospital. On his initial physical examination his blood pressure was 149/99, HR 87, RR 18 and 02 saturation 97%.  Lungs with no wheezing or rhonchi, heart with S1 and S2 present and regular, no rubs, or gallops, respiratory with no wheezing or rhonchi, abdomen with no distention and positive lower extremity edema.   Na 138, K 3,9 CL 107 bicarbonate 21, glucose  125, bun 21 cr 1,55  BNP 685 and 1054  Ck 729  High sensitive troponin 96, 83, 90  Wbc 3,9 hgb 11,1 plt 117   Chest radiograph with hypoinflation, positive cardiomegaly, with bilateral hilar vascular congestion, no effusions.  CT chest with bilateral ground glass opacities and increased septal thickening with bilateral atelectasis. Mild enlarged mediastinal lymph nodes. Enlargement of pulmonary arteries.   EKG 90 bpm, normal axis, normal intervals, qtc 496, sinus rhythm with poor R R wave progression with no significant ST segment or T wave changes.  Positive LVH.   Patient was placed on furosemide for diuresis.   03/23 volume status is improving. Follow up chest radiograph with improvement in pulmonary edema.  03/24 patient clinically euvolemic, pending renal function to be more stable.  03/25  renal function now more stable, patient continue to improve clinically.  Plan for discharge home and have close follow up as outpatient.   Assessment and Plan: * Acute on chronic systolic CHF (congestive heart failure) (HCC) Echocardiogram with reduced LV systolic function with EF 30 to 35%, moderate LVH, RV with moderate reduction in systolic function, moderate increased right ventricular wall thickness. No significant valvular disease.   Patient was placed on IV furosemide for diuresis, negative fluid balance was achieved, - 11,238 ml, with significant improvement in his symptoms.   Patient will continue diuresis at home with furosemide 40 mg daily.  Continue with empagliflozin, losartan and spironolactone No B blocker due to low EF.   Troponin elevation due to heart failure exacerbation. No acute coronary syndrome.   He has been advised about  staying adherent to his medical therapy to avoid re-hospitalizations.   Essential hypertension Continue blood pressure control with amlodipine, losartan and diuresis with spironolactone.  Cocaine abuse (HCC) Cessation counseling    Rhabdomyolysis Ck 729 Follow up Ck 218, with resolution of rhabdomyolysis.   CKD (chronic kidney disease), stage II AKI,   At the time of his discharge his renal function has a serum cr of 1,55 with K at 4,5 and serum bicarbonate at 23.  Na 135 Mg 2.5   Plan to continue diuresis at home with furosemide and spironolactone. Follow up renal function as outpatient.  Tobacco abuse Smoking cessation   Chronic anemia Follow up hg is 12.8 , with thrombocytopenia at 142 and leukopenia at 3.6    COPD (chronic obstructive pulmonary disease) (HCC) Cough has improved.  Patient was placed on antitussive agents and bronchodilator therapy. For mild exacerbation.     Consultants: none  Procedures performed: none   Disposition: Home Diet recommendation:  Cardiac diet DISCHARGE MEDICATION: Allergies as of 08/05/2023       Reactions   Banana Nausea And Vomiting   Egg-derived Products Nausea And Vomiting   Pork-derived Products Other (See Comments)   No pork products d/t Religious preference         Medication List     TAKE these medications    albuterol 108 (90 Base) MCG/ACT inhaler Commonly known as: VENTOLIN HFA Inhale 2 puffs into the lungs every 4 (four) hours as needed for wheezing or shortness of breath.   amLODipine 10 MG tablet Commonly known as: NORVASC Take 1 tablet (10 mg total) by mouth daily.   empagliflozin 10 MG Tabs tablet Commonly known as: Jardiance Take 1 tablet (10 mg total) by mouth daily.   furosemide 40 MG tablet Commonly known as: LASIX Take 1 tablet (40 mg total) by mouth daily.   gabapentin 100 MG capsule Commonly known as: NEURONTIN Take 1 capsule (100 mg total) by mouth 2 (two) times daily.   losartan 25 MG tablet Commonly known as: COZAAR Take 1 tablet (25 mg total) by mouth daily.   sodium chloride 0.65 % Soln nasal spray Commonly known as: OCEAN Place 1 spray into both nostrils as needed for congestion.   spironolactone 25 MG  tablet Commonly known as: ALDACTONE Take 1 tablet (25 mg total) by mouth daily.        Discharge Exam: Filed Weights   08/03/23 0430 08/04/23 0509 08/05/23 0445  Weight: 71.8 kg 72.4 kg 73.6 kg   BP (!) 134/92 (BP Location: Left Arm)   Pulse 64   Temp 98.6 F (37 C) (Oral)   Resp 16   Ht 5\' 6"  (1.676 m)   Wt 73.6 kg   SpO2 95%   BMI 26.20 kg/m   Patient is feeling better, his dyspnea and edema have improved, he has occasional cough   Neurology awake and alert ENT with no pallor Cardiovascular with S1 and S2 present and regular with no gallops, rubs or mumurs No JVD No lower extremity edema Respiratory with no rales or wheezing, no rhonchi Abdomen with no distention   Condition at discharge: stable  The results of significant diagnostics from this hospitalization (including imaging, microbiology, ancillary and laboratory) are listed below for reference.   Imaging Studies: DG Chest 1 View Result Date: 08/03/2023 CLINICAL DATA:  Shortness of breath. EXAM: CHEST  1 VIEW COMPARISON:  07/31/2023 and CT chest 07/31/2023. FINDINGS: Trachea is midline. Heart is enlarged. Lungs are clear. No pleural fluid.  IMPRESSION: No acute findings. Electronically Signed   By: Leanna Battles M.D.   On: 08/03/2023 14:29   CT Chest W Contrast Result Date: 07/31/2023 CLINICAL DATA:  Dyspnea.  Abnormal chest x-ray EXAM: CT CHEST WITH CONTRAST TECHNIQUE: Multidetector CT imaging of the chest was performed during intravenous contrast administration. RADIATION DOSE REDUCTION: This exam was performed according to the departmental dose-optimization program which includes automated exposure control, adjustment of the mA and/or kV according to patient size and/or use of iterative reconstruction technique. CONTRAST:  50mL OMNIPAQUE IOHEXOL 350 MG/ML SOLN COMPARISON:  Chest x-ray earlier 07/31/2023. CT scan 01/27/2023, CT angiogram. FINDINGS: Cardiovascular: Heart is enlarged. Coronary artery calcifications  are seen. Trace pericardial fluid. The thoracic aorta has a normal course and caliber with slight atherosclerotic plaque. Once again there is enlargement of the pulmonary arteries. Please correlate for pulmonary hypertension. Mediastinum/Nodes: Preserved thyroid gland. Normal caliber thoracic esophagus. There is some motion. There is some small nodes identified in the axillary regions bilaterally but are less than a cm short axis and not pathologic by size criteria. These also were seen on the prior CT. Enlarged mediastinal nodes are again seen. This includes left paratracheal measuring 16 mm in short axis on series 3, image 50. Previously this would have measured 15 mm, unchanged. Other small mediastinal nodes are stable as well. There is some small bilateral hilar nodes as well. The contrast opacification on this examination is relatively limited which may be technical and related to the patient's cardiac output. Lungs/Pleura: Significant motion throughout the examination. Underlying centrilobular emphysematous changes. Dependent scarring atelectatic changes of the right lung base greater than left. Mild interstitial septal thickening. No pneumothorax, consolidation or effusion. Upper Abdomen: Adrenal glands are preserved in the upper abdomen. Clips in the left upper quadrant near the splenic hilum, unchanged. Musculoskeletal: Mild degenerative changes along the spine with near bridging and some bridging endplate osteophytes along the lower thoracic spine upper lumbar spine. IMPRESSION: Limited examination by motion in the contrast bolus. Enlarged heart with enlargement of the pulmonary arteries. Emphysematous lung changes identified with some scarring atelectatic changes as well as interstitial septal thickening, similar to previous. There are persistent mildly enlarged mediastinal lymph nodes identified similar to the previous examination. Recommend continued follow up surveillance. The finding by x-ray at the  right hilum may be related to the enlarged pulmonary vessels Aortic Atherosclerosis (ICD10-I70.0) and Emphysema (ICD10-J43.9). Electronically Signed   By: Karen Kays M.D.   On: 07/31/2023 12:06   DG Chest Portable 1 View Result Date: 07/31/2023 CLINICAL DATA:  Dyspnea. EXAM: PORTABLE CHEST 1 VIEW COMPARISON:  07/14/2023 FINDINGS: Low lung volumes. There is pulmonary vascular congestion without overt pulmonary edema. Soft tissue fullness noted in the right hilum. No pleural effusion. No acute bony abnormality. Telemetry leads overlie the chest. IMPRESSION: 1. Low lung volumes with pulmonary vascular congestion. 2. Soft tissue fullness in the right hilum. Lymphadenopathy or central pulmonary lesion not excluded. CT chest with contrast recommended to further evaluate. Electronically Signed   By: Kennith Center M.D.   On: 07/31/2023 07:43   DG Hand Complete Right Result Date: 07/16/2023 CLINICAL DATA:  Right hand pain and swelling, no known injury, initial encounter EXAM: RIGHT HAND - COMPLETE 3+ VIEW COMPARISON:  11/16/2022 FINDINGS: No acute fracture or dislocation is noted. Small well corticated bony density is noted at the base of the fifth metatarsal consistent with prior fracture and nonunion. This is stable from the prior exam. Mild soft tissue swelling is noted. IMPRESSION: Chronic  bony changes Soft tissue swelling. Electronically Signed   By: Alcide Clever M.D.   On: 07/16/2023 03:06   CT Head Wo Contrast Result Date: 07/14/2023 CLINICAL DATA:  Generalized body aches. EXAM: CT HEAD WITHOUT CONTRAST TECHNIQUE: Contiguous axial images were obtained from the base of the skull through the vertex without intravenous contrast. RADIATION DOSE REDUCTION: This exam was performed according to the departmental dose-optimization program which includes automated exposure control, adjustment of the mA and/or kV according to patient size and/or use of iterative reconstruction technique. COMPARISON:  June 09, 2023  FINDINGS: Brain: No evidence of acute infarction, hemorrhage, hydrocephalus, extra-axial collection or mass lesion/mass effect. Vascular: No hyperdense vessel or unexpected calcification. Skull: Normal. Negative for fracture or focal lesion. Sinuses/Orbits: A chronic fracture deformity is seen involving the lamina papyracea on the left. Other: None. IMPRESSION: No acute intracranial abnormality. Electronically Signed   By: Aram Candela M.D.   On: 07/14/2023 01:30   DG Chest 2 View Result Date: 07/14/2023 CLINICAL DATA:  Body aches. EXAM: CHEST - 2 VIEW COMPARISON:  July 03, 2023 FINDINGS: There is stable mild to moderate severity cardiac silhouette enlargement. Mild to moderate severity prominence of the central pulmonary vasculature is noted. This is mildly increased in severity when compared to the prior study. There is no evidence of an acute infiltrate, pleural effusion or pneumothorax. The visualized skeletal structures are unremarkable. IMPRESSION: Stable cardiomegaly with mild to moderate severity central pulmonary vascular congestion. Electronically Signed   By: Aram Candela M.D.   On: 07/14/2023 00:54    Microbiology: Results for orders placed or performed during the hospital encounter of 07/14/23  Resp panel by RT-PCR (RSV, Flu A&B, Covid) Anterior Nasal Swab     Status: None   Collection Time: 07/14/23  1:29 AM   Specimen: Anterior Nasal Swab  Result Value Ref Range Status   SARS Coronavirus 2 by RT PCR NEGATIVE NEGATIVE Final   Influenza A by PCR NEGATIVE NEGATIVE Final   Influenza B by PCR NEGATIVE NEGATIVE Final    Comment: (NOTE) The Xpert Xpress SARS-CoV-2/FLU/RSV plus assay is intended as an aid in the diagnosis of influenza from Nasopharyngeal swab specimens and should not be used as a sole basis for treatment. Nasal washings and aspirates are unacceptable for Xpert Xpress SARS-CoV-2/FLU/RSV testing.  Fact Sheet for  Patients: BloggerCourse.com  Fact Sheet for Healthcare Providers: SeriousBroker.it  This test is not yet approved or cleared by the [place] FDA and has been authorized for detection and/or diagnosis of SARS-CoV-2 by FDA under an Emergency Use Authorization (EUA). This EUA will remain in effect (meaning this test can be used) for the duration of the COVID-19 declaration under Section 564(b)(1) of the Act, 21 U.S.C. section 360bbb-3(b)(1), unless the authorization is terminated or revoked.     Resp Syncytial Virus by PCR NEGATIVE NEGATIVE Final    Comment: (NOTE) Fact Sheet for Patients: BloggerCourse.com  Fact Sheet for Healthcare Providers: SeriousBroker.it  This test is not yet approved or cleared by the [place] FDA and has been authorized for detection and/or diagnosis of SARS-CoV-2 by FDA under an Emergency Use Authorization (EUA). This EUA will remain in effect (meaning this test can be used) for the duration of the COVID-19 declaration under Section 564(b)(1) of the Act, 21 U.S.C. section 360bbb-3(b)(1), unless the authorization is terminated or revoked.  Performed at [facility], [address] [place], [place] [address]     Labs: CBC: Recent Labs  Lab 07/31/23 [phone]  08/01/23 0454  WBC 3.9* 3.6*  NEUTROABS 2.1 1.8  HGB 11.1* 12.8*  HCT 33.9* 38.7*  MCV 104.0* 103.5*  PLT 117* 142*   Basic Metabolic Panel: Recent Labs  Lab 08/01/23 0454 08/02/23 0248 08/03/23 0813 08/04/23 0257 08/05/23 0302  NA 135 136 136 135 135  K 4.5 4.1 4.4 4.0 4.6  CL 106 101 103 101 105  CO2 22 26 25 25 23   GLUCOSE 84 131* 156* 103* 91  BUN 17 30* 32* 40* 39*  CREATININE 1.27* 1.41* 1.56* 1.70* 1.55*  CALCIUM 7.8* 9.1 8.9 9.1 8.9  MG 1.9 2.2 2.4 2.5*  --   PHOS 3.3  --   --   --   --    Liver Function Tests: Recent Labs  Lab 07/31/23 0733  08/01/23 0454  AST 39 35  ALT 25 26  ALKPHOS 58 59  BILITOT 0.8 0.8  PROT 6.4* 6.5  ALBUMIN 3.2* 3.1*   CBG: No results for input(s): "GLUCAP" in the last 168 hours.  Discharge time spent: greater than 30 minutes.  Signed: Coralie Keens, MD [facility] 08/05/2023

## 2023-08-05 NOTE — TOC Transition Note (Addendum)
 Transition of Care [facility]) - Discharge Note   Patient Details  Name: Charles [name]. MRN: [id number] Date of Birth: 02-15-1971  Transition of Care [facility]) CM/SW Contact:  Leone Haven, RN Phone Number: 08/05/2023, 10:33 AM   Clinical Narrative:    For dc today, NCM assisted him with two bus passes.  TOC to fill medications. Patient states he does not see a doctor for follow up and he will not be going to a dcotor.         Patient Goals and CMS Choice            Discharge Placement                       Discharge Plan and Services Additional resources added to the After Visit Summary for                                       Social Drivers of Health (SDOH) Interventions SDOH Screenings   Food Insecurity: Food Insecurity Present (07/31/2023)  Housing: High Risk (07/31/2023)  Transportation Needs: Unmet Transportation Needs (07/31/2023)  Utilities: At Risk (07/31/2023)  Alcohol Screen: Medium Risk (02/24/2023)  Financial Resource Strain: Low Risk  (07/07/2023)  Social Connections: Unknown (09/24/2021)   Received from [facility]  Tobacco Use: High Risk (07/31/2023)     Readmission Risk Interventions    06/23/2023    4:32 PM 01/29/2023    8:43 AM 10/04/2022   11:27 AM  Readmission Risk Prevention Plan  Transportation Screening Complete Complete Complete  Medication Review Oceanographer) Complete Complete Complete  PCP or Specialist appointment within 3-5 days of discharge Complete Complete Complete  HRI or Home Care Consult Complete Complete Complete  SW Recovery Care/Counseling Consult Complete  Complete  Palliative Care Screening Not Applicable Not Applicable Not Applicable  Skilled Nursing Facility Not Applicable Not Applicable Not Applicable

## 2023-08-05 NOTE — Progress Notes (Signed)
 Pt has orders to be discharged. Discharge instructions given and pt has no additional questions at this time. Medication regimen reviewed and pt educated. Pt verbalized understanding and has no additional questions. Telemetry box removed. IV removed and site in good condition.

## 2023-08-05 NOTE — Care Management Important Message (Signed)
 Important Message  Patient Details  Name: Charles [name]. MRN: [id number] Date of Birth: 1971-01-01   Important Message Given:  Yes - Medicare IM     [name] 08/05/2023, 10:24 AM

## 2023-08-05 NOTE — Consult Note (Signed)
 [facility] Liaison Consult Note   08/05/2023  Charles Wint. 06-Feb-1971 [id number]  Insurance: [employer] Medicare  Primary Care Provider: Renaye Rakers, MD is listed Charles [name] and MD denies seeing any doctor per Inpatient [facility] RN in progression rounds]    [facility] Liaison met patient at bedside at [facility].  Patient states he is not planning to follow up with any doctor. Reviewed encounters for appointments and no PCP visits noted] Explained that [facility] RN has tried to contact him in the past.  Explained about concerns for his health and taking care of his medical conditions.  He states, "they can call if I answer I will talk to them."    The patient was screened for 30 day readmission hospitalization with noted extreme risk score for unplanned readmission risk  3 ED and 9 hospital admissions in 6 months.  The patient was assessed for potential [facility] Coordination service needs for post hospital transition for care coordination. Review of patient's electronic medical record reveals patient is mainly un-housed but he states he can be with his niece when he wants to.   Plan:   Referral request for community care coordination: Anticipate [facility] TOC team to follow up post hospital.   [facility], [facility] does not replace or interfere with any arrangements made by the Inpatient Transition of Care team.   For questions contact:   Charlesetta Shanks, RN, BSN, CCM [place]  [facility], [facility] Liaison Direct Dial: [phone] or secure chat Email: [email or web address]

## 2023-08-05 NOTE — Care Management Important Message (Signed)
 Important Message  Patient Details  Name: Charles [name]. MRN: [id number] Date of Birth: [birth year]   Important Message Given:  Yes - Medicare IM     [name] 08/05/2023, 11:52 AM

## 2023-08-14 ENCOUNTER — Other Ambulatory Visit: Payer: Self-pay

## 2023-08-14 ENCOUNTER — Emergency Department (HOSPITAL_COMMUNITY)
Admission: EM | Admit: 2023-08-14 | Discharge: 2023-08-15 | Disposition: A | Discharge status: Home / Self Care | Attending: Emergency Medicine | Admitting: Emergency Medicine

## 2023-08-14 ENCOUNTER — Emergency Department (HOSPITAL_COMMUNITY)

## 2023-08-14 DIAGNOSIS — Z79899 Other long term (current) drug therapy: Secondary | ICD-10-CM | POA: Diagnosis not present

## 2023-08-14 DIAGNOSIS — N1832 Chronic kidney disease, stage 3b: Secondary | ICD-10-CM | POA: Insufficient documentation

## 2023-08-14 DIAGNOSIS — I509 Heart failure, unspecified: Secondary | ICD-10-CM | POA: Insufficient documentation

## 2023-08-14 DIAGNOSIS — R0602 Shortness of breath: Secondary | ICD-10-CM | POA: Insufficient documentation

## 2023-08-14 DIAGNOSIS — J449 Chronic obstructive pulmonary disease, unspecified: Secondary | ICD-10-CM | POA: Diagnosis not present

## 2023-08-14 DIAGNOSIS — R7989 Other specified abnormal findings of blood chemistry: Secondary | ICD-10-CM | POA: Insufficient documentation

## 2023-08-14 DIAGNOSIS — R600 Localized edema: Secondary | ICD-10-CM | POA: Diagnosis not present

## 2023-08-14 DIAGNOSIS — Z5900 Homelessness unspecified: Secondary | ICD-10-CM | POA: Diagnosis not present

## 2023-08-14 DIAGNOSIS — I130 Hypertensive heart and chronic kidney disease with heart failure and stage 1 through stage 4 chronic kidney disease, or unspecified chronic kidney disease: Secondary | ICD-10-CM | POA: Insufficient documentation

## 2023-08-14 DIAGNOSIS — I501 Left ventricular failure: Secondary | ICD-10-CM | POA: Diagnosis not present

## 2023-08-14 DIAGNOSIS — R059 Cough, unspecified: Secondary | ICD-10-CM | POA: Diagnosis not present

## 2023-08-14 DIAGNOSIS — I517 Cardiomegaly: Secondary | ICD-10-CM | POA: Diagnosis not present

## 2023-08-14 DIAGNOSIS — R609 Edema, unspecified: Secondary | ICD-10-CM | POA: Diagnosis not present

## 2023-08-14 LAB — CBC
HCT: 40.8 % (ref 39.0–52.0)
Hemoglobin: 13.9 g/dL (ref 13.0–17.0)
MCH: 34.1 pg — ABNORMAL HIGH (ref 26.0–34.0)
MCHC: 34.1 g/dL (ref 30.0–36.0)
MCV: 100 fL (ref 80.0–100.0)
Platelets: 145 10*3/uL — ABNORMAL LOW (ref 150–400)
RBC: 4.08 MIL/uL — ABNORMAL LOW (ref 4.22–5.81)
RDW: 16.7 % — ABNORMAL HIGH (ref 11.5–15.5)
WBC: 3.4 10*3/uL — ABNORMAL LOW (ref 4.0–10.5)
nRBC: 0 % (ref 0.0–0.2)

## 2023-08-14 LAB — BASIC METABOLIC PANEL WITH GFR
Anion gap: 6 (ref 5–15)
BUN: 12 mg/dL (ref 6–20)
CO2: 26 mmol/L (ref 22–32)
Calcium: 8.9 mg/dL (ref 8.9–10.3)
Chloride: 106 mmol/L (ref 98–111)
Creatinine, Ser: 1.37 mg/dL — ABNORMAL HIGH (ref 0.61–1.24)
GFR, Estimated: >60 mL/min (ref >60)
Glucose, Bld: 147 mg/dL — ABNORMAL HIGH (ref 70–99)
Potassium: 3.6 mmol/L (ref 3.5–5.1)
Sodium: 138 mmol/L (ref 135–145)

## 2023-08-14 LAB — RESP PANEL BY RT-PCR (RSV, FLU A&B, COVID)  RVPGX2
Influenza A by PCR: NEGATIVE
Influenza B by PCR: NEGATIVE
Resp Syncytial Virus by PCR: NEGATIVE
SARS Coronavirus 2 by RT PCR: NEGATIVE

## 2023-08-14 LAB — TROPONIN I (HIGH SENSITIVITY): Troponin I (High Sensitivity): 72 ng/L — ABNORMAL HIGH (ref <18)

## 2023-08-14 LAB — BRAIN NATRIURETIC PEPTIDE: B Natriuretic Peptide: 891.2 pg/mL — ABNORMAL HIGH (ref 0.0–100.0)

## 2023-08-14 MED ORDER — POTASSIUM CHLORIDE CRYS ER 20 MEQ PO TBCR
40.0000 meq | EXTENDED_RELEASE_TABLET | Freq: Once | ORAL | Status: AC
  Administered 2023-08-14: 40 meq via ORAL
  Filled 2023-08-14: qty 2

## 2023-08-14 MED ORDER — FUROSEMIDE 10 MG/ML IJ SOLN
40.0000 mg | Freq: Once | INTRAMUSCULAR | Status: AC
  Administered 2023-08-14: 40 mg via INTRAVENOUS
  Filled 2023-08-14: qty 4

## 2023-08-14 NOTE — ED Notes (Signed)
 Transported to xray

## 2023-08-14 NOTE — ED Provider Notes (Signed)
 [place] EMERGENCY DEPARTMENT AT [facility] Provider Note   CSN: [id number] Arrival date & time: 08/14/23  2103     History {Add pertinent medical, surgical, social history, OB history to HPI:1} Chief Complaint  Patient presents with   Influenza    Charles [name]. is a 53 y.o. male.   Influenza   53 year old male presents emergency department with complaints of shortness of breath, cough, chest pressure.  States that his had symptoms for the past couple of days.  Reports shortness of breath lying flat as well as with exertion.  States that he was having some central chest pressure yesterday when he was laying down but currently not experiencing this discomfort; chest pain not worsened with exertion.  Denies any fevers, chills, nausea, abdominal pain, urinary symptoms, change in bowel habits.  Patient does state that he has not been taking his "fluid pill" at home because it causes him to urinate more frequently.  Patient states he feels like he is retaining fluid.  Patient states that he used cocaine Monday through Wednesday but not today.  Past medical history significant for CHF, hypertension, polysubstance abuse.  COPD, chronic hepatitis C, seizure, CKD 3B  Home Medications Prior to Admission medications   Medication Sig Start Date End Date Taking? Authorizing Provider  albuterol (VENTOLIN HFA) 108 (90 Base) MCG/ACT inhaler Inhale 2 puffs into the lungs every 4 (four) hours as needed for wheezing or shortness of breath. 08/05/23   Arrien, York Ram, MD  amLODipine (NORVASC) 10 MG tablet Take 1 tablet (10 mg total) by mouth daily. 08/05/23 11/03/23  Arrien, York Ram, MD  empagliflozin (JARDIANCE) 10 MG TABS tablet Take 1 tablet (10 mg total) by mouth daily. 08/05/23   Arrien, York Ram, MD  furosemide (LASIX) 40 MG tablet Take 1 tablet (40 mg total) by mouth daily. 08/05/23   Arrien, York Ram, MD  gabapentin (NEURONTIN) 100 MG capsule Take 1  capsule (100 mg total) by mouth 2 (two) times daily. 08/05/23 10/04/23  Arrien, York Ram, MD  losartan (COZAAR) 25 MG tablet Take 1 tablet (25 mg total) by mouth daily. 08/05/23   Arrien, York Ram, MD  sodium chloride (OCEAN) 0.65 % SOLN nasal spray Place 1 spray into both nostrils as needed for congestion. 08/05/23   Arrien, York Ram, MD  spironolactone (ALDACTONE) 25 MG tablet Take 1 tablet (25 mg total) by mouth daily. 08/05/23 09/04/23  Arrien, York Ram, MD      Allergies    Banana, Egg-derived products, and Pork-derived products    Review of Systems   Review of Systems  All other systems reviewed and are negative.   Physical Exam Updated Vital Signs BP (!) 150/97   Pulse 85   Temp 98.1 F (36.7 C) (Oral)   Resp 16   Ht 5\' 6"  (1.676 m)   Wt 74 kg   SpO2 98%   BMI 26.33 kg/m  Physical Exam Vitals and nursing note reviewed.  Constitutional:      General: He is not in acute distress.    Appearance: He is well-developed.  HENT:     Head: Normocephalic and atraumatic.  Eyes:     Conjunctiva/sclera: Conjunctivae normal.  Cardiovascular:     Rate and Rhythm: Normal rate and regular rhythm.  Pulmonary:     Effort: Pulmonary effort is normal. No respiratory distress.     Breath sounds: Rales present.  Abdominal:     Palpations: Abdomen is soft.  Tenderness: There is no abdominal tenderness.  Musculoskeletal:        General: No swelling.     Cervical back: Neck supple.     Right lower leg: Edema present.     Left lower leg: Edema present.     Comments: 1-2+ pitting edema bilateral lower extremities.  Skin:    General: Skin is warm and dry.     Capillary Refill: Capillary refill takes less than 2 seconds.  Neurological:     Mental Status: He is alert.  Psychiatric:        Mood and Affect: Mood normal.     ED Results / Procedures / Treatments   Labs (all labs ordered are listed, but only abnormal results are displayed) Labs Reviewed   RESP PANEL BY RT-PCR (RSV, FLU A&B, COVID)  [id number]    EKG None  Radiology No results found.  Procedures Procedures  {Document cardiac monitor, telemetry assessment procedure when appropriate:1}  Medications Ordered in ED Medications - No data to display  ED Course/ Medical Decision Making/ A&P   {   Click here for ABCD2, HEART and other calculatorsREFRESH Note before signing :1}                              Medical Decision Making  This patient presents to the ED for concern of shortness of breath, this involves an extensive number of treatment options, and is a complaint that carries with it a high risk of complications and morbidity.  The differential diagnosis includes ACS, PE, pneumothorax, CHF, anemia, viral URI, pneumonia, other   Co morbidities that complicate the patient evaluation  See HPI   Additional history obtained:  Additional history obtained from EMR External records from outside source obtained and reviewed including hospital records   Lab Tests:  I Ordered, and personally interpreted labs.  The pertinent results include:  ***   Imaging Studies ordered:  I ordered imaging studies including chest ray I independently visualized and interpreted imaging which showed *** I agree with the radiologist interpretation   Cardiac Monitoring: / EKG:  The patient was maintained on a cardiac monitor.  I personally viewed and interpreted the cardiac monitored which showed an underlying rhythm of: Sinus rhythm.  LAE.  LVH.  Prolonged QT.  Consultations Obtained:  See ED course  Problem List / ED Course / Critical interventions / Medication management  *** I ordered medication including ***  for ***  Reevaluation of the patient after these medicines showed that the patient {resolved/improved/worsened:23923::"improved"} I have reviewed the patients home medicines and have made adjustments as needed   Social Determinants of Health:  ***   Test /  Admission - Considered:  *** Vitals signs significant for ***. Otherwise within normal range and stable throughout visit. Laboratory/imaging studies significant for: Above  *** Worrisome signs and symptoms were discussed with the patient, and the patient acknowledged understanding to return to the ED if noticed. Patient was stable upon discharge.    {Document critical care time when appropriate:1} {Document review of labs and clinical decision tools ie heart score, Chads2Vasc2 etc:1}  {Document your independent review of radiology images, and any outside records:1} {Document your discussion with family members, caretakers, and with consultants:1} {Document social determinants of health affecting pt's care:1} {Document your decision making why or why not admission, treatments were needed:1} Final Clinical Impression(s) / ED Diagnoses Final diagnoses:  None    Rx / DC Orders  ED Discharge Orders     None

## 2023-08-14 NOTE — ED Triage Notes (Signed)
 Pt BIB [facility] for CHF, however pt complaining of cold/flu symptoms. Pt is not taking any medications prescribed. Pt refused EKG  140 60HR  98% RA CBG 155 96.2 oral

## 2023-08-15 ENCOUNTER — Encounter (HOSPITAL_COMMUNITY): Payer: Self-pay

## 2023-08-15 ENCOUNTER — Other Ambulatory Visit (HOSPITAL_COMMUNITY): Payer: Self-pay

## 2023-08-15 DIAGNOSIS — R0602 Shortness of breath: Secondary | ICD-10-CM | POA: Diagnosis not present

## 2023-08-15 LAB — TROPONIN I (HIGH SENSITIVITY): Troponin I (High Sensitivity): 62 ng/L — ABNORMAL HIGH (ref <18)

## 2023-08-15 MED ORDER — FUROSEMIDE 40 MG PO TABS
40.0000 mg | ORAL_TABLET | Freq: Every day | ORAL | 0 refills | Status: DC
  Filled 2023-08-15: qty 30, 30d supply, fill #0

## 2023-08-15 MED ORDER — ACETAMINOPHEN 325 MG PO TABS
650.0000 mg | ORAL_TABLET | Freq: Once | ORAL | Status: AC
  Administered 2023-08-15: 650 mg via ORAL
  Filled 2023-08-15: qty 2

## 2023-08-15 NOTE — ED Notes (Signed)
 Pt refused to ambulate, stating he can't walk with the fluid on him. EDP made aware.

## 2023-08-15 NOTE — ED Notes (Signed)
 Pt ambulated without decrease in oxygen saturation which stayed at 100% on room air.

## 2023-08-15 NOTE — ED Notes (Signed)
 EDP at bedside

## 2023-08-15 NOTE — Discharge Instructions (Signed)
 As discussed, your workup today was overall reassuring.  Recommend continue taking your prescribed medications at home and follow-up with your primary care.  Please do not hesitate to return if the worrisome signs and symptoms we discussed become apparent.

## 2023-08-18 ENCOUNTER — Telehealth: Payer: Self-pay

## 2023-08-18 NOTE — Transitions of Care (Post Inpatient/ED Visit) (Signed)
   08/18/2023  Name: Charles [name]. MRN: [id number] DOB: 13-Mar-1971  Today's TOC FU Call Status: Today's TOC FU Call Status:: Unsuccessful Call (1st Attempt) Unsuccessful Call (1st Attempt) Date: 08/18/23  Attempted to reach the patient regarding the most recent Inpatient/ED visit.  Follow Up Plan: Additional outreach attempts will be made to reach the patient to complete the Transitions of Care (Post Inpatient/ED visit) call.   Signature Karena Addison, LPN [facility] Nurse Health Advisor Direct Dial [phone]

## 2023-08-20 NOTE — Transitions of Care (Post Inpatient/ED Visit) (Signed)
   08/20/2023  Name: Charles [name]. MRN: [id number] DOB: [birth year]  Today's TOC FU Call Status: Today's TOC FU Call Status:: Unsuccessful Call (2nd Attempt) Unsuccessful Call (1st Attempt) Date: 08/18/23 Unsuccessful Call (2nd Attempt) Date: 08/20/23  Attempted to reach the patient regarding the most recent Inpatient/ED visit.  Follow Up Plan: Additional outreach attempts will be made to reach the patient to complete the Transitions of Care (Post Inpatient/ED visit) call.   Signature Karena Addison, LPN [facility] Nurse Health Advisor Direct Dial [phone]

## 2023-08-25 NOTE — Transitions of Care (Post Inpatient/ED Visit) (Signed)
   08/25/2023  Name: Charles [name]. MRN: [id number] DOB: 24-Nov-1970  Today's TOC FU Call Status: Today's TOC FU Call Status:: Unsuccessful Call (3rd Attempt) Unsuccessful Call (1st Attempt) Date: 08/18/23 Unsuccessful Call (2nd Attempt) Date: 08/20/23 Unsuccessful Call (3rd Attempt) Date: 08/25/23  Attempted to reach the patient regarding the most recent Inpatient/ED visit.  Follow Up Plan: No further outreach attempts will be made at this time. We have been unable to contact the patient.  Signature Darrall Ellison, LPN [facility] Nurse Health Advisor Direct Dial [phone]

## 2023-08-28 ENCOUNTER — Encounter (HOSPITAL_COMMUNITY): Payer: Self-pay

## 2023-08-28 ENCOUNTER — Observation Stay (HOSPITAL_COMMUNITY)
Admission: EM | Admit: 2023-08-28 | Discharge: 2023-08-29 | Disposition: A | Discharge status: Home / Self Care | Attending: Family Medicine | Admitting: Family Medicine

## 2023-08-28 ENCOUNTER — Other Ambulatory Visit: Payer: Self-pay

## 2023-08-28 ENCOUNTER — Emergency Department (HOSPITAL_COMMUNITY)

## 2023-08-28 DIAGNOSIS — Z79899 Other long term (current) drug therapy: Secondary | ICD-10-CM | POA: Diagnosis not present

## 2023-08-28 DIAGNOSIS — D61818 Other pancytopenia: Secondary | ICD-10-CM

## 2023-08-28 DIAGNOSIS — F1910 Other psychoactive substance abuse, uncomplicated: Secondary | ICD-10-CM | POA: Diagnosis not present

## 2023-08-28 DIAGNOSIS — R0789 Other chest pain: Secondary | ICD-10-CM | POA: Diagnosis not present

## 2023-08-28 DIAGNOSIS — N1832 Chronic kidney disease, stage 3b: Secondary | ICD-10-CM | POA: Insufficient documentation

## 2023-08-28 DIAGNOSIS — B1920 Unspecified viral hepatitis C without hepatic coma: Secondary | ICD-10-CM | POA: Insufficient documentation

## 2023-08-28 DIAGNOSIS — Z7984 Long term (current) use of oral hypoglycemic drugs: Secondary | ICD-10-CM | POA: Insufficient documentation

## 2023-08-28 DIAGNOSIS — I429 Cardiomyopathy, unspecified: Secondary | ICD-10-CM

## 2023-08-28 DIAGNOSIS — I5042 Chronic combined systolic (congestive) and diastolic (congestive) heart failure: Secondary | ICD-10-CM

## 2023-08-28 DIAGNOSIS — M549 Dorsalgia, unspecified: Secondary | ICD-10-CM | POA: Diagnosis not present

## 2023-08-28 DIAGNOSIS — R42 Dizziness and giddiness: Secondary | ICD-10-CM | POA: Diagnosis not present

## 2023-08-28 DIAGNOSIS — I130 Hypertensive heart and chronic kidney disease with heart failure and stage 1 through stage 4 chronic kidney disease, or unspecified chronic kidney disease: Secondary | ICD-10-CM | POA: Insufficient documentation

## 2023-08-28 DIAGNOSIS — I517 Cardiomegaly: Secondary | ICD-10-CM | POA: Diagnosis not present

## 2023-08-28 DIAGNOSIS — I2489 Other forms of acute ischemic heart disease: Secondary | ICD-10-CM | POA: Insufficient documentation

## 2023-08-28 DIAGNOSIS — I10 Essential (primary) hypertension: Secondary | ICD-10-CM

## 2023-08-28 DIAGNOSIS — N179 Acute kidney failure, unspecified: Principal | ICD-10-CM | POA: Insufficient documentation

## 2023-08-28 DIAGNOSIS — F17210 Nicotine dependence, cigarettes, uncomplicated: Secondary | ICD-10-CM | POA: Diagnosis not present

## 2023-08-28 DIAGNOSIS — I213 ST elevation (STEMI) myocardial infarction of unspecified site: Secondary | ICD-10-CM | POA: Diagnosis not present

## 2023-08-28 DIAGNOSIS — F1410 Cocaine abuse, uncomplicated: Secondary | ICD-10-CM

## 2023-08-28 DIAGNOSIS — R079 Chest pain, unspecified: Secondary | ICD-10-CM | POA: Diagnosis present

## 2023-08-28 DIAGNOSIS — I200 Unstable angina: Secondary | ICD-10-CM | POA: Diagnosis not present

## 2023-08-28 DIAGNOSIS — D696 Thrombocytopenia, unspecified: Secondary | ICD-10-CM | POA: Diagnosis not present

## 2023-08-28 LAB — CBC WITH DIFFERENTIAL/PLATELET
Abs Immature Granulocytes: 0.01 10*3/uL (ref 0.00–0.07)
Basophils Absolute: 0 10*3/uL (ref 0.0–0.1)
Basophils Relative: 0 %
Eosinophils Absolute: 0 10*3/uL (ref 0.0–0.5)
Eosinophils Relative: 0 %
HCT: 36.4 % — ABNORMAL LOW (ref 39.0–52.0)
Hemoglobin: 12.2 g/dL — ABNORMAL LOW (ref 13.0–17.0)
Immature Granulocytes: 0 %
Lymphocytes Relative: 26 %
Lymphs Abs: 0.9 10*3/uL (ref 0.7–4.0)
MCH: 34.2 pg — ABNORMAL HIGH (ref 26.0–34.0)
MCHC: 33.5 g/dL (ref 30.0–36.0)
MCV: 102 fL — ABNORMAL HIGH (ref 80.0–100.0)
Monocytes Absolute: 0.6 10*3/uL (ref 0.1–1.0)
Monocytes Relative: 16 %
Neutro Abs: 2.1 10*3/uL (ref 1.7–7.7)
Neutrophils Relative %: 58 %
Platelets: 124 10*3/uL — ABNORMAL LOW (ref 150–400)
RBC: 3.57 MIL/uL — ABNORMAL LOW (ref 4.22–5.81)
RDW: 16.9 % — ABNORMAL HIGH (ref 11.5–15.5)
WBC: 3.7 10*3/uL — ABNORMAL LOW (ref 4.0–10.5)
nRBC: 0 % (ref 0.0–0.2)

## 2023-08-28 LAB — HEPATIC FUNCTION PANEL
ALT: 28 U/L (ref 0–44)
AST: 51 U/L — ABNORMAL HIGH (ref 15–41)
Albumin: 3.3 g/dL — ABNORMAL LOW (ref 3.5–5.0)
Alkaline Phosphatase: 57 U/L (ref 38–126)
Bilirubin, Direct: 0.2 mg/dL (ref 0.0–0.2)
Indirect Bilirubin: 0.5 mg/dL (ref 0.3–0.9)
Total Bilirubin: 0.7 mg/dL (ref 0.0–1.2)
Total Protein: 6.8 g/dL (ref 6.5–8.1)

## 2023-08-28 LAB — SEDIMENTATION RATE: Sed Rate: 23 mm/h — ABNORMAL HIGH (ref 0–16)

## 2023-08-28 LAB — HEMOGLOBIN A1C
Hgb A1c MFr Bld: 5.7 % — ABNORMAL HIGH (ref 4.8–5.6)
Mean Plasma Glucose: 116.89 mg/dL

## 2023-08-28 LAB — VITAMIN B12: Vitamin B-12: 305 pg/mL (ref 180–914)

## 2023-08-28 LAB — BASIC METABOLIC PANEL WITH GFR
Anion gap: 9 (ref 5–15)
BUN: 36 mg/dL — ABNORMAL HIGH (ref 6–20)
CO2: 24 mmol/L (ref 22–32)
Calcium: 9.4 mg/dL (ref 8.9–10.3)
Chloride: 104 mmol/L (ref 98–111)
Creatinine, Ser: 2.63 mg/dL — ABNORMAL HIGH (ref 0.61–1.24)
GFR, Estimated: 28 mL/min — ABNORMAL LOW (ref >60)
Glucose, Bld: 118 mg/dL — ABNORMAL HIGH (ref 70–99)
Potassium: 4.3 mmol/L (ref 3.5–5.1)
Sodium: 137 mmol/L (ref 135–145)

## 2023-08-28 LAB — T4, FREE: Free T4: 0.78 ng/dL (ref 0.61–1.12)

## 2023-08-28 LAB — BRAIN NATRIURETIC PEPTIDE: B Natriuretic Peptide: 163.7 pg/mL — ABNORMAL HIGH (ref 0.0–100.0)

## 2023-08-28 LAB — RAPID URINE DRUG SCREEN, HOSP PERFORMED
Amphetamines: NOT DETECTED
Barbiturates: NOT DETECTED
Benzodiazepines: NOT DETECTED
Cocaine: POSITIVE — AB
Opiates: POSITIVE — AB
Tetrahydrocannabinol: NOT DETECTED

## 2023-08-28 LAB — TROPONIN I (HIGH SENSITIVITY)
Troponin I (High Sensitivity): 126 ng/L (ref <18)
Troponin I (High Sensitivity): 114 ng/L (ref <18)

## 2023-08-28 LAB — TSH: TSH: 0.533 u[IU]/mL (ref 0.350–4.500)

## 2023-08-28 LAB — C-REACTIVE PROTEIN: CRP: 0.8 mg/dL (ref <1.0)

## 2023-08-28 LAB — ETHANOL: Alcohol, Ethyl (B): <10 mg/dL (ref <10)

## 2023-08-28 LAB — CK: Total CK: 919 U/L — ABNORMAL HIGH (ref 49–397)

## 2023-08-28 LAB — FOLATE: Folate: 15.3 ng/mL (ref >5.9)

## 2023-08-28 MED ORDER — ACETAMINOPHEN 325 MG PO TABS: 650.0000 mg | ORAL_TABLET | Freq: Four times a day (QID) | ORAL | Status: DC | PRN

## 2023-08-28 MED ORDER — ACETAMINOPHEN 650 MG RE SUPP: 650.0000 mg | Freq: Four times a day (QID) | RECTAL | Status: DC | PRN

## 2023-08-28 MED ORDER — SODIUM CHLORIDE 0.9% FLUSH
3.0000 mL | Freq: Two times a day (BID) | INTRAVENOUS | Status: DC
  Administered 2023-08-28: 3 mL via INTRAVENOUS
  Administered 2023-08-29: 5 mL via INTRAVENOUS

## 2023-08-28 MED ORDER — ISOSORB DINITRATE-HYDRALAZINE 20-37.5 MG PO TABS
1.0000 | ORAL_TABLET | Freq: Two times a day (BID) | ORAL | Status: DC
  Administered 2023-08-28 – 2023-08-29 (×3): 1 via ORAL
  Filled 2023-08-28 (×3): qty 1

## 2023-08-28 MED ORDER — HYDROCODONE-ACETAMINOPHEN 5-325 MG PO TABS
1.0000 | ORAL_TABLET | ORAL | Status: DC | PRN
  Administered 2023-08-29: 1 via ORAL
  Filled 2023-08-28: qty 1

## 2023-08-28 MED ORDER — SODIUM CHLORIDE 0.9% FLUSH: 3.0000 mL | INTRAVENOUS | Status: DC | PRN

## 2023-08-28 MED ORDER — MORPHINE SULFATE (PF) 4 MG/ML IV SOLN
4.0000 mg | Freq: Once | INTRAVENOUS | Status: AC
  Administered 2023-08-28: 4 mg via INTRAVENOUS
  Filled 2023-08-28: qty 1

## 2023-08-28 MED ORDER — MORPHINE SULFATE (PF) 2 MG/ML IV SOLN: 2.0000 mg | INTRAVENOUS | Status: DC | PRN

## 2023-08-28 MED ORDER — SODIUM CHLORIDE 0.9% FLUSH
3.0000 mL | Freq: Two times a day (BID) | INTRAVENOUS | Status: DC
  Administered 2023-08-28: 3 mL via INTRAVENOUS

## 2023-08-28 MED ORDER — THIAMINE HCL 100 MG/ML IJ SOLN
100.0000 mg | Freq: Every day | INTRAMUSCULAR | Status: DC
  Administered 2023-08-29: 100 mg via INTRAVENOUS
  Filled 2023-08-28: qty 2

## 2023-08-28 MED ORDER — NITROGLYCERIN 0.4 MG SL SUBL: 0.4000 mg | SUBLINGUAL_TABLET | SUBLINGUAL | Status: DC | PRN

## 2023-08-28 NOTE — ED Notes (Addendum)
 Patient refusing to keep leads bp cuff and pulse ox on, educated the patient on the importance of monitoring patient refusing. Provider made aware  Safety and comfort maintained patient is a/o call bell within reach. Siderails up x2 safety and comfort maintained all needs met. Multiple  drinks and snack provided at patients request

## 2023-08-28 NOTE — ED Provider Notes (Signed)
 [place] EMERGENCY DEPARTMENT AT [facility] Provider Note   CSN: [id number] Arrival date & time: 08/28/23  1914     History  Chief Complaint  Patient presents with   Chest Pain    Charles [name]. is a 53 y.o. male.  The history is provided by the patient.  Chest Pain He has history of hypertension, combined systolic and diastolic heart failure, cocaine abuse, homelessness and comes in because of chest pain.  He states that he is actually hurting everywhere.  Symptoms started about 45 minutes ago.  He does admit to having used cocaine about 3 hours ago.  There is associated dyspnea but no nausea or diaphoresis.  He came in by ambulance where he was given aspirin.  He is a cigarette smoker.  He admits that he does not usually take his blood pressure medication but states he did not take it yesterday.   Home Medications Prior to Admission medications   Medication Sig Start Date End Date Taking? Authorizing Provider  albuterol (VENTOLIN HFA) 108 (90 Base) MCG/ACT inhaler Inhale 2 puffs into the lungs every 4 (four) hours as needed for wheezing or shortness of breath. 08/05/23   Arrien, York Ram, MD  amLODipine (NORVASC) 10 MG tablet Take 1 tablet (10 mg total) by mouth daily. 08/05/23 11/03/23  Arrien, York Ram, MD  empagliflozin (JARDIANCE) 10 MG TABS tablet Take 1 tablet (10 mg total) by mouth daily. 08/05/23   Arrien, York Ram, MD  furosemide (LASIX) 40 MG tablet Take 1 tablet (40 mg total) by mouth daily. 08/15/23   Peter Garter, PA  gabapentin (NEURONTIN) 100 MG capsule Take 1 capsule (100 mg total) by mouth 2 (two) times daily. 08/05/23 10/04/23  Arrien, York Ram, MD  losartan (COZAAR) 25 MG tablet Take 1 tablet (25 mg total) by mouth daily. 08/05/23   Arrien, York Ram, MD  sodium chloride (OCEAN) 0.65 % SOLN nasal spray Place 1 spray into both nostrils as needed for congestion. 08/05/23   Arrien, York Ram, MD  spironolactone  (ALDACTONE) 25 MG tablet Take 1 tablet (25 mg total) by mouth daily. 08/05/23 09/04/23  Arrien, York Ram, MD      Allergies    Banana, Egg-derived products, and Pork-derived products    Review of Systems   Review of Systems  Cardiovascular:  Positive for chest pain.  All other systems reviewed and are negative.   Physical Exam Updated Vital Signs BP 122/67   Pulse 73   Temp (!) 97.5 F (36.4 C) (Oral)   Resp 20   Ht 5\' 6"  (1.676 m)   Wt 74 kg   SpO2 98%   BMI 26.33 kg/m  Physical Exam Vitals and nursing note reviewed.   53 year old male, appears somewhat anxious, but is in no acute distress. Vital signs are normal. Oxygen saturation is 96%, which is normal. Head is normocephalic and atraumatic. PERRLA, EOMI. Oropharynx is clear. Neck is nontender and supple without adenopathy or JVD. Lungs are clear without rales, wheezes, or rhonchi. Chest is nontender. Heart has regular rate and rhythm without murmur. Abdomen is soft, flat, nontender. Extremities have trace edema. Skin is warm and dry without rash. Neurologic: Mental status is normal, cranial nerves are intact, moves all extremities equally.  ED Results / Procedures / Treatments   Labs (all labs ordered are listed, but only abnormal results are displayed) Labs Reviewed  BASIC METABOLIC PANEL WITH GFR - Abnormal; Notable for the following components:  Result Value   Glucose, Bld 118 (*)    BUN 36 (*)    Creatinine, Ser 2.63 (*)    GFR, Estimated 28 (*)    All other components within normal limits  CBC WITH DIFFERENTIAL/PLATELET - Abnormal; Notable for the following components:   WBC 3.7 (*)    RBC 3.57 (*)    Hemoglobin 12.2 (*)    HCT 36.4 (*)    MCV 102.0 (*)    MCH 34.2 (*)    RDW 16.9 (*)    Platelets 124 (*)    All other components within normal limits  TROPONIN I (HIGH SENSITIVITY) - Abnormal; Notable for the following components:   Troponin I (High Sensitivity) 126 (*)    All other  components within normal limits  TROPONIN I (HIGH SENSITIVITY)    EKG EKG Interpretation Date/Time:  Thursday August 28 2023 06:40:36 EDT Ventricular Rate:  61 PR Interval:  171 QRS Duration:  78 QT Interval:  472 QTC Calculation: 476 R Axis:   70  Text Interpretation: Sinus rhythm LVH with secondary repolarization abnormality Anterior ST elevation, probably due to LVH Borderline prolonged QT interval When compared with ECG of EARLIER SAME DATE No significant change was found Confirmed by Dione Booze ([id number]) on 08/28/2023 7:47:27 AM  Radiology DG Chest Port 1 View Result Date: 08/28/2023 CLINICAL DATA:  53 year old male with chest pain. EXAM: PORTABLE CHEST 1 VIEW COMPARISON:  Chest radiographs 08/14/2023 and earlier. FINDINGS: Portable AP semi upright view at 0639 hours. Stable cardiomegaly and mediastinal contours. No pericardial effusion on CTA last month. Larger lung volumes. Visualized tracheal air column is within normal limits. Allowing for portable technique the lungs are clear. No pneumothorax or pleural effusion. Stable visualized osseous structures. IMPRESSION: Stable cardiomegaly. No acute cardiopulmonary abnormality. Electronically Signed   By: Odessa Fleming M.D.   On: 08/28/2023 07:00    Procedures Procedures  Cardiac monitor shows normal sinus rhythm, per my interpretation.  Medications Ordered in ED Medications  nitroGLYCERIN (NITROSTAT) SL tablet 0.4 mg (has no administration in time range)  morphine (PF) 4 MG/ML injection 4 mg (has no administration in time range)    ED Course/ Medical Decision Making/ A&P                                 Medical Decision Making Amount and/or Complexity of Data Reviewed Labs: ordered. Radiology: ordered.  Risk Prescription drug management.   Chest pain in the setting of cocaine use.  Medication noncompliance.  I have reviewed his electrocardiogram, and my interpretation is left ventricular hypertrophy with secondary  repolarization changes.  However, when compared with ECG of 08/14/2023, there are T wave inversions in the inferior and anterolateral leads which were not present previously concerning for acute ischemic event.  I have ordered sublingual nitroglycerin and repeat electrocardiogram to see if there is any progression.  I have reviewed his past records, and I note echocardiogram done on 07/04/2023 with ejection fraction of 30-35% and grade 1 diastolic dysfunction, concentric left ventricular hypertrophy.  He had no relief of pain with nitroglycerin.  I repeated his electrocardiogram, my interpretation is no change from initial ECG.  I have ordered morphine for pain and I have ordered a third ECG to make sure that he is not showing progression of an occult myocardial infarction.  At this point, the only laboratory test that is back is a CBC showing pancytopenia not significantly changed  from baseline.  Case is signed out to Dr. Manus Sellers.  CRITICAL CARE Performed by: Alissa April Total critical care time: 45 minutes Critical care time was exclusive of separately billable procedures and treating other patients. Critical care was necessary to treat or prevent imminent or life-threatening deterioration. Critical care was time spent personally by me on the following activities: development of treatment plan with patient and/or surrogate as well as nursing, discussions with consultants, evaluation of patient's response to treatment, examination of patient, obtaining history from patient or surrogate, ordering and performing treatments and interventions, ordering and review of laboratory studies, ordering and review of radiographic studies, pulse oximetry and re-evaluation of patient's condition.  Final Clinical Impression(s) / ED Diagnoses Final diagnoses:  Nonspecific chest pain  Cocaine abuse (HCC)  Pancytopenia (HCC)    Rx / DC Orders ED Discharge Orders     None         Alissa April, MD 08/28/23  [phone]

## 2023-08-28 NOTE — ED Notes (Signed)
 Pt found drinking water from the sink.  Sts "I had to get something.  I'm super dehydrated."  Pt reminded he is waiting to see the Cardiologist.

## 2023-08-28 NOTE — ED Notes (Signed)
 MD notified of troponin result of 126.

## 2023-08-28 NOTE — ED Notes (Signed)
 Pt called this writer into the room because he has a knot on his L medial AC from the site where EMS attempted to start a line.  No medications were administered.  A warm compress was placed on the site.

## 2023-08-28 NOTE — ED Triage Notes (Signed)
 Pt BIB [facility] from sidewalk d/t CP started 1 hour ago after doing Cocaine.  Hx of CHF - has SOB and BLE swelling.

## 2023-08-28 NOTE — ED Notes (Signed)
 A heart healthy house tray has been ordered for the patient.  Per the kitchen, the tray will be delivered around 6:15pm.
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
## 2023-08-28 NOTE — ED Notes (Addendum)
 Entered in error

## 2023-08-28 NOTE — ED Provider Notes (Signed)
 7:51 AM Care assumed from Dr. Candelaria Chaco.  At time of transfer of care, patient is awaiting callback from cardiology for chest pain in the setting of cocaine use however patient has EKG changes, positive troponin, and was still having chest pain.  Repeat EKG will be obtained and patient was going to be given morphine.  He has already received aspirin with EMS.  Due to these findings, cardiology will be called for recommendations.  2:52 PM Cardiology just reported they would like him to admit to medicine and they will see in consultation for this chest pain with EKG changes and positive troponin.  Clinical Impression: 1. Nonspecific chest pain   2. Cocaine abuse (HCC)   3. Pancytopenia (HCC)     Disposition: Admit  This note was prepared with assistance of Dragon voice recognition software. Occasional wrong-word or sound-a-like substitutions may have occurred due to the inherent limitations of voice recognition software.     Briscoe Daniello, Marine Sia, MD 08/28/23 1452
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
## 2023-08-28 NOTE — ED Notes (Signed)
 Pt being rude to staff and making comments about him "not being the one."  Pt has been eating his personal food all day after being told not to eat and is now asking for food every few minutes since being told he can eat.  Pt upset because staff is limiting his snacking.  Security called to bedside to have a discussion w/ Pt.

## 2023-08-29 ENCOUNTER — Other Ambulatory Visit (HOSPITAL_COMMUNITY): Payer: Self-pay

## 2023-08-29 ENCOUNTER — Observation Stay (HOSPITAL_BASED_OUTPATIENT_CLINIC_OR_DEPARTMENT_OTHER)

## 2023-08-29 DIAGNOSIS — I5042 Chronic combined systolic (congestive) and diastolic (congestive) heart failure: Secondary | ICD-10-CM | POA: Diagnosis not present

## 2023-08-29 DIAGNOSIS — I10 Essential (primary) hypertension: Secondary | ICD-10-CM | POA: Diagnosis not present

## 2023-08-29 DIAGNOSIS — R079 Chest pain, unspecified: Secondary | ICD-10-CM | POA: Diagnosis not present

## 2023-08-29 DIAGNOSIS — I429 Cardiomyopathy, unspecified: Secondary | ICD-10-CM | POA: Diagnosis not present

## 2023-08-29 DIAGNOSIS — R0789 Other chest pain: Secondary | ICD-10-CM | POA: Diagnosis not present

## 2023-08-29 DIAGNOSIS — N179 Acute kidney failure, unspecified: Secondary | ICD-10-CM | POA: Diagnosis not present

## 2023-08-29 LAB — COMPREHENSIVE METABOLIC PANEL WITH GFR
ALT: 26 U/L (ref 0–44)
AST: 38 U/L (ref 15–41)
Albumin: 3.3 g/dL — ABNORMAL LOW (ref 3.5–5.0)
Alkaline Phosphatase: 64 U/L (ref 38–126)
Anion gap: 7 (ref 5–15)
BUN: 29 mg/dL — ABNORMAL HIGH (ref 6–20)
CO2: 23 mmol/L (ref 22–32)
Calcium: 8.6 mg/dL — ABNORMAL LOW (ref 8.9–10.3)
Chloride: 106 mmol/L (ref 98–111)
Creatinine, Ser: 1.57 mg/dL — ABNORMAL HIGH (ref 0.61–1.24)
GFR, Estimated: 53 mL/min — ABNORMAL LOW (ref >60)
Glucose, Bld: 106 mg/dL — ABNORMAL HIGH (ref 70–99)
Potassium: 4.2 mmol/L (ref 3.5–5.1)
Sodium: 136 mmol/L (ref 135–145)
Total Bilirubin: 0.6 mg/dL (ref 0.0–1.2)
Total Protein: 6.9 g/dL (ref 6.5–8.1)

## 2023-08-29 LAB — ECHOCARDIOGRAM COMPLETE
Area-P 1/2: 3.47 cm2
Calc EF: 37.6 %
Est EF: 40
Height: 66 in
S' Lateral: 3.5 cm
Single Plane A2C EF: 44.8 %
Single Plane A4C EF: 33.4 %
Weight: 2610.25 [oz_av]

## 2023-08-29 LAB — CK: Total CK: 598 U/L — ABNORMAL HIGH (ref 49–397)

## 2023-08-29 LAB — CBC
HCT: 37.8 % — ABNORMAL LOW (ref 39.0–52.0)
Hemoglobin: 12.8 g/dL — ABNORMAL LOW (ref 13.0–17.0)
MCH: 34.8 pg — ABNORMAL HIGH (ref 26.0–34.0)
MCHC: 33.9 g/dL (ref 30.0–36.0)
MCV: 102.7 fL — ABNORMAL HIGH (ref 80.0–100.0)
Platelets: 142 10*3/uL — ABNORMAL LOW (ref 150–400)
RBC: 3.68 MIL/uL — ABNORMAL LOW (ref 4.22–5.81)
RDW: 16.9 % — ABNORMAL HIGH (ref 11.5–15.5)
WBC: 4 10*3/uL (ref 4.0–10.5)
nRBC: 0 % (ref 0.0–0.2)

## 2023-08-29 MED ORDER — SPIRONOLACTONE 25 MG PO TABS
25.0000 mg | ORAL_TABLET | Freq: Every day | ORAL | 0 refills | Status: DC
Start: 2023-08-29 — End: 2023-09-30
  Filled 2023-08-29: qty 90, 90d supply, fill #0

## 2023-08-29 MED ORDER — EMPAGLIFLOZIN 10 MG PO TABS
10.0000 mg | ORAL_TABLET | Freq: Every day | ORAL | 0 refills | Status: DC
  Filled 2023-08-29: qty 90, 90d supply, fill #0

## 2023-08-29 MED ORDER — PERFLUTREN LIPID MICROSPHERE
1.0000 mL | INTRAVENOUS | Status: AC | PRN
  Administered 2023-08-29: 2 mL via INTRAVENOUS

## 2023-08-29 MED ORDER — AMLODIPINE BESYLATE 10 MG PO TABS
10.0000 mg | ORAL_TABLET | Freq: Every day | ORAL | 0 refills | Status: DC
Start: 2023-08-29 — End: 2023-09-30
  Filled 2023-08-29: qty 90, 90d supply, fill #0

## 2023-08-29 MED ORDER — ISOSORB DINITRATE-HYDRALAZINE 20-37.5 MG PO TABS
1.0000 | ORAL_TABLET | Freq: Two times a day (BID) | ORAL | 0 refills | Status: DC
  Filled 2023-08-29: qty 180, 90d supply, fill #0

## 2023-08-29 NOTE — ED Notes (Signed)
 Pt states his prescriptions were dropped off earlier and he has them in his backpack. IV removed. Discharge instructions provided verbally and written.

## 2023-08-29 NOTE — Progress Notes (Signed)
  Echocardiogram 2D Echocardiogram has been performed.  Royden Corin 08/29/2023, 1:28 PM

## 2023-08-29 NOTE — ED Notes (Signed)
 Patient ambulate steady to bathroom without assist

## 2023-08-29 NOTE — ED Notes (Signed)
 Patient ambulating around unit without dizziness, refusing many nursing activities including the telemetry monitor and vital sign checks. Repeatedly asking for drinks and for graham crackers.

## 2023-08-29 NOTE — Discharge Summary (Signed)
 Physician Discharge Summary   Patient: Charles [name]. MRN: [id number] DOB: 1970-06-21  Admit date:     08/28/2023  Discharge date: 08/29/23  Discharge Physician: Lonni SHAUNNA Dalton   PCP: Benjamine Aland, MD     Recommendations at discharge:  Follow up with PCP Dr. Benjamine in 1 week Dr. Benjamine: Please check BMP (discharge Cr 1.5) and resume losartan , furosemide  as appropriate  Follow up with Cardiology within 6 months     Discharge Diagnoses: Principal Problem:   AKI Other hospital problems:   Chest pain due to cocaine   Chronic systolic and diastolic congestive heart failure   Hypertension   Chronic kidney disease stage IIIb   Hepatitis C   Chronic thrombocytopenia   Polysubstance abuse     Hospital Course: 53 y.o. M with sdCHF EF 30-35%, HTN, CKD IIIb baseline 1.5, homelessness, hep C, chronic thrombocytopenia, and polysubstance abuse who presented with chest pain and dizziness after using cocaine.    AKI on CKD IIIb Patient was given fluids, creatinine resolved overnight.  Mild elevation in CK, improved overnight.  Losartan  and furosemide  held at discharge, recommend close PCP follow-up   Dizziness Due to dehydration, AKI.  Resolved.   Chest pain Demand ischemia due to cocaine Patient was evaluated by cardiology.  He underwent echocardiogram that showed EF improved to 40 to 45%.  They recommended no further workup.   Chronic systolic diastolic congestive heart failure Patient reports he uses furosemide  as needed.  Did not appear fluid overloaded on exam.  Discharged with 90 day supply of all medications.  Patient reported he does follow with Dr. Benjamine, has access for appointments.    Recommended to follow up for resumption of losartan , furosemide .  Polysubstance use Cessation recommended.             The [place]  Controlled Substances Registry was reviewed for this patient prior to discharge.  Consultants: Cardiology Procedures  performed: Echo  Disposition: Home Diet recommendation:  Discharge Diet Orders (From admission, onward)     Start     Ordered   08/29/23 0000  Diet - low sodium heart healthy        08/29/23 1106             DISCHARGE MEDICATION: Allergies as of 08/29/2023       Reactions   Banana Nausea And Vomiting   Egg-derived Products Nausea And Vomiting   Pork-derived Products Other (See Comments)   No pork products d/t Religious preference         Medication List     PAUSE taking these medications    furosemide  40 MG tablet Wait to take this until your doctor or other care provider tells you to start again. Commonly known as: LASIX  Take 1 tablet (40 mg total) by mouth daily.   losartan  25 MG tablet Wait to take this until your doctor or other care provider tells you to start again. Commonly known as: COZAAR  Take 1 tablet (25 mg total) by mouth daily.       TAKE these medications    albuterol  108 (90 Base) MCG/ACT inhaler Commonly known as: VENTOLIN  HFA Inhale 2 puffs into the lungs every 4 (four) hours as needed for wheezing or shortness of breath.   amLODipine  10 MG tablet Commonly known as: NORVASC  Take 1 tablet (10 mg total) by mouth daily.   empagliflozin  10 MG Tabs tablet Commonly known as: Jardiance  Take 1 tablet (10 mg total) by mouth daily.   gabapentin   100 MG capsule Commonly known as: NEURONTIN  Take 1 capsule (100 mg total) by mouth 2 (two) times daily.   isosorbide -hydrALAZINE  20-37.5 MG tablet Commonly known as: BIDIL  Take 1 tablet by mouth 2 (two) times daily.   sodium chloride  0.65 % Soln nasal spray Commonly known as: OCEAN Place 1 spray into both nostrils as needed for congestion.   spironolactone  25 MG tablet Commonly known as: ALDACTONE  Take 1 tablet (25 mg total) by mouth daily.        Follow-up Information     Benjamine Aland, MD. Schedule an appointment as soon as possible for a visit in 1 week(s).   Specialty: Family  Medicine Contact information: [address] [place] [address] [phone]                 Discharge Instructions     Diet - low sodium heart healthy   Complete by: As directed    Discharge instructions   Complete by: As directed    **IMPORTANT DISCHARGE INSTRUCTIONS**   From Dr. Jonel: You were admitted for chest pain and feeling dizzy.  Here, we found that the chest pain was from cocaine.  Your echo (ultrasound of the heart) was unchanged from previous.  Avoid drugs.  Do not use cocaine in any form.  The dizziness was from dehydration.  The dehydration had caused some kidney injury  For now, HOLD (do not take) your furosemide  and losartan   You SHOULD be taking: Amlodipine , spironolactone , and empagliflozin /Jardiance  once daily   ALSO take isosorbide -hydralazine /Bidil  TWICE daily (once in the morning and once at night)  These are all heart protection medicines  Go see Dr. Benjamine, your primary care doctor ASAP  Ask him to check your kidney function  Ask him for refills of your other heart protection medicines furosemide  and losartan    Increase activity slowly   Complete by: As directed        Discharge Exam: Filed Weights   08/28/23 0611  Weight: 74 kg    General: Pt is alert, awake, not in acute distress Cardiovascular: RRR, nl S1-S2, no murmurs appreciated.   No LE edema.   Respiratory: Normal respiratory rate and rhythm.  CTAB without rales or wheezes. Abdominal: Abdomen soft and non-tender.  No distension or HSM.   Neuro/Psych: Strength symmetric in upper and lower extremities.  Judgment and insight appear normal.   Condition at discharge: good  The results of significant diagnostics from this hospitalization (including imaging, microbiology, ancillary and laboratory) are listed below for reference.   Imaging Studies: DG Chest Port 1 View Result Date: 08/28/2023 CLINICAL DATA:  53 year old male with chest pain. EXAM: PORTABLE CHEST 1  VIEW COMPARISON:  Chest radiographs 08/14/2023 and earlier. FINDINGS: Portable AP semi upright view at 0639 hours. Stable cardiomegaly and mediastinal contours. No pericardial effusion on CTA last month. Larger lung volumes. Visualized tracheal air column is within normal limits. Allowing for portable technique the lungs are clear. No pneumothorax or pleural effusion. Stable visualized osseous structures. IMPRESSION: Stable cardiomegaly. No acute cardiopulmonary abnormality. Electronically Signed   By: VEAR Hurst M.D.   On: 08/28/2023 07:00   DG Chest 2 View Result Date: 08/14/2023 CLINICAL DATA:  Shortness of breath EXAM: CHEST - 2 VIEW COMPARISON:  Chest x-ray 08/03/2023 FINDINGS: The heart is enlarged, unchanged. The lungs are clear. There is no pleural effusion or pneumothorax. No acute fractures are seen. IMPRESSION: 1. No active cardiopulmonary disease. 2. Cardiomegaly. Electronically Signed   By: Greig Pique  M.D.   On: 08/14/2023 22:34   DG Chest 1 View Result Date: 08/03/2023 CLINICAL DATA:  Shortness of breath. EXAM: CHEST  1 VIEW COMPARISON:  07/31/2023 and CT chest 07/31/2023. FINDINGS: Trachea is midline. Heart is enlarged. Lungs are clear. No pleural fluid. IMPRESSION: No acute findings. Electronically Signed   By: Newell Eke M.D.   On: 08/03/2023 14:29   CT Chest W Contrast Result Date: 07/31/2023 CLINICAL DATA:  Dyspnea.  Abnormal chest x-ray EXAM: CT CHEST WITH CONTRAST TECHNIQUE: Multidetector CT imaging of the chest was performed during intravenous contrast administration. RADIATION DOSE REDUCTION: This exam was performed according to the departmental dose-optimization program which includes automated exposure control, adjustment of the mA and/or kV according to patient size and/or use of iterative reconstruction technique. CONTRAST:  50mL OMNIPAQUE  IOHEXOL  350 MG/ML SOLN COMPARISON:  Chest x-ray earlier 07/31/2023. CT scan 01/27/2023, CT angiogram. FINDINGS: Cardiovascular: Heart is  enlarged. Coronary artery calcifications are seen. Trace pericardial fluid. The thoracic aorta has a normal course and caliber with slight atherosclerotic plaque. Once again there is enlargement of the pulmonary arteries. Please correlate for pulmonary hypertension. Mediastinum/Nodes: Preserved thyroid  gland. Normal caliber thoracic esophagus. There is some motion. There is some small nodes identified in the axillary regions bilaterally but are less than a cm short axis and not pathologic by size criteria. These also were seen on the prior CT. Enlarged mediastinal nodes are again seen. This includes left paratracheal measuring 16 mm in short axis on series 3, image 50. Previously this would have measured 15 mm, unchanged. Other small mediastinal nodes are stable as well. There is some small bilateral hilar nodes as well. The contrast opacification on this examination is relatively limited which may be technical and related to the patient's cardiac output. Lungs/Pleura: Significant motion throughout the examination. Underlying centrilobular emphysematous changes. Dependent scarring atelectatic changes of the right lung base greater than left. Mild interstitial septal thickening. No pneumothorax, consolidation or effusion. Upper Abdomen: Adrenal glands are preserved in the upper abdomen. Clips in the left upper quadrant near the splenic hilum, unchanged. Musculoskeletal: Mild degenerative changes along the spine with near bridging and some bridging endplate osteophytes along the lower thoracic spine upper lumbar spine. IMPRESSION: Limited examination by motion in the contrast bolus. Enlarged heart with enlargement of the pulmonary arteries. Emphysematous lung changes identified with some scarring atelectatic changes as well as interstitial septal thickening, similar to previous. There are persistent mildly enlarged mediastinal lymph nodes identified similar to the previous examination. Recommend continued follow up  surveillance. The finding by x-ray at the right hilum may be related to the enlarged pulmonary vessels Aortic Atherosclerosis (ICD10-I70.0) and Emphysema (ICD10-J43.9). Electronically Signed   By: Ranell Bring M.D.   On: 07/31/2023 12:06   DG Chest Portable 1 View Result Date: 07/31/2023 CLINICAL DATA:  Dyspnea. EXAM: PORTABLE CHEST 1 VIEW COMPARISON:  07/14/2023 FINDINGS: Low lung volumes. There is pulmonary vascular congestion without overt pulmonary edema. Soft tissue fullness noted in the right hilum. No pleural effusion. No acute bony abnormality. Telemetry leads overlie the chest. IMPRESSION: 1. Low lung volumes with pulmonary vascular congestion. 2. Soft tissue fullness in the right hilum. Lymphadenopathy or central pulmonary lesion not excluded. CT chest with contrast recommended to further evaluate. Electronically Signed   By: Camellia Candle M.D.   On: 07/31/2023 07:43    Microbiology: Results for orders placed or performed during the hospital encounter of 08/14/23  Resp panel by RT-PCR (RSV, Flu A&B, Covid) Anterior Nasal Swab  Status: None   Collection Time: 08/14/23  9:12 PM   Specimen: Anterior Nasal Swab  Result Value Ref Range Status   SARS Coronavirus 2 by RT PCR NEGATIVE NEGATIVE Final   Influenza A by PCR NEGATIVE NEGATIVE Final   Influenza B by PCR NEGATIVE NEGATIVE Final    Comment: (NOTE) The Xpert Xpress SARS-CoV-2/FLU/RSV plus assay is intended as an aid in the diagnosis of influenza from Nasopharyngeal swab specimens and should not be used as a sole basis for treatment. Nasal washings and aspirates are unacceptable for Xpert Xpress SARS-CoV-2/FLU/RSV testing.  Fact Sheet for Patients: bloggercourse.com  Fact Sheet for Healthcare Providers: seriousbroker.it  This test is not yet approved or cleared by the [place]  FDA and has been authorized for detection and/or diagnosis of SARS-CoV-2 by FDA under an  Emergency Use Authorization (EUA). This EUA will remain in effect (meaning this test can be used) for the duration of the COVID-19 declaration under Section 564(b)(1) of the Act, 21 U.S.C. section 360bbb-3(b)(1), unless the authorization is terminated or revoked.     Resp Syncytial Virus by PCR NEGATIVE NEGATIVE Final    Comment: (NOTE) Fact Sheet for Patients: bloggercourse.com  Fact Sheet for Healthcare Providers: seriousbroker.it  This test is not yet approved or cleared by the [place]  FDA and has been authorized for detection and/or diagnosis of SARS-CoV-2 by FDA under an Emergency Use Authorization (EUA). This EUA will remain in effect (meaning this test can be used) for the duration of the COVID-19 declaration under Section 564(b)(1) of the Act, 21 U.S.C. section 360bbb-3(b)(1), unless the authorization is terminated or revoked.  Performed at [facility], [address], [place], [place] [address]     Labs: CBC: Recent Labs  Lab 08/28/23 0626 08/29/23 0707  WBC 3.7* 4.0  NEUTROABS 2.1  --   HGB 12.2* 12.8*  HCT 36.4* 37.8*  MCV 102.0* 102.7*  PLT 124* 142*   Basic Metabolic Panel: Recent Labs  Lab 08/28/23 0626 08/29/23 0707  NA 137 136  K 4.3 4.2  CL 104 106  CO2 24 23  GLUCOSE 118* 106*  BUN 36* 29*  CREATININE 2.63* 1.57*  CALCIUM  9.4 8.6*   Liver Function Tests: Recent Labs  Lab 08/28/23 1639 08/29/23 0707  AST 51* 38  ALT 28 26  ALKPHOS 57 64  BILITOT 0.7 0.6  PROT 6.8 6.9  ALBUMIN 3.3* 3.3*   CBG: No results for input(s): GLUCAP in the last 168 hours.  Discharge time spent: approximately 45 minutes spent on discharge counseling, evaluation of patient on day of discharge, and coordination of discharge planning with nursing, social work, pharmacy and case management  Signed: Lonni SHAUNNA Dalton, MD [facility] 08/29/2023

## 2023-08-29 NOTE — Progress Notes (Signed)
 Progress Note  Patient Name: Charles [name]. Date of Encounter: 08/29/2023  Primary Cardiologist:   Olinda Bertrand, DO   Subjective   Denies chest pain or SOB.    Inpatient Medications    Scheduled Meds:  isosorbide -hydrALAZINE   1 tablet Oral BID   sodium chloride  flush  3 mL Intravenous Q12H   sodium chloride  flush  3-10 mL Intravenous Q12H   thiamine  (VITAMIN B1) injection  100 mg Intravenous Daily   Continuous Infusions:  PRN Meds: acetaminophen  **OR** acetaminophen , HYDROcodone -acetaminophen , morphine  injection, nitroGLYCERIN , sodium chloride  flush   Vital Signs    Vitals:   08/28/23 2242 08/29/23 0016 08/29/23 0215 08/29/23 0559  BP:  116/71 116/76 110/81  Pulse:  77 68 72  Resp:  14 16 16   Temp:  98.3 F (36.8 C) 98 F (36.7 C) 98.4 F (36.9 C)  TempSrc:  Oral Oral Oral  SpO2: 99% 100% 98% 100%  Weight:      Height:        Intake/Output Summary (Last 24 hours) at 08/29/2023 0759 Last data filed at 08/28/2023 2122 Gross per 24 hour  Intake --  Output 200 ml  Net -200 ml   Filed Weights   08/28/23 0611  Weight: 74 kg    Telemetry    NA (Leads off)- Personally Reviewed  ECG    NA - Personally Reviewed  Physical Exam   GEN: No acute distress.   Neck: No  JVD Cardiac: RRR, no murmurs, rubs, or gallops.  Respiratory: Clear  to auscultation bilaterally. GI: Soft, nontender, non-distended  MS: No  edema; No deformity. Neuro:  Nonfocal  Psych: Normal affect   Labs    Chemistry Recent Labs  Lab 08/28/23 0626 08/28/23 1639  NA 137  --   K 4.3  --   CL 104  --   CO2 24  --   GLUCOSE 118*  --   BUN 36*  --   CREATININE 2.63*  --   CALCIUM  9.4  --   PROT  --  6.8  ALBUMIN  --  3.3*  AST  --  51*  ALT  --  28  ALKPHOS  --  57  BILITOT  --  0.7  GFRNONAA 28*  --   ANIONGAP 9  --      Hematology Recent Labs  Lab 08/28/23 0626 08/29/23 0707  WBC 3.7* 4.0  RBC 3.57* 3.68*  HGB 12.2* 12.8*  HCT 36.4* 37.8*  MCV 102.0*  102.7*  MCH 34.2* 34.8*  MCHC 33.5 33.9  RDW 16.9* 16.9*  PLT 124* 142*    Cardiac EnzymesNo results for input(s): "TROPONINI" in the last 168 hours. No results for input(s): "TROPIPOC" in the last 168 hours.   BNP Recent Labs  Lab 08/28/23 1639  BNP 163.7*     DDimer No results for input(s): "DDIMER" in the last 168 hours.   Radiology    DG Chest Port 1 View Result Date: 08/28/2023 CLINICAL DATA:  53 year old male with chest pain. EXAM: PORTABLE CHEST 1 VIEW COMPARISON:  Chest radiographs 08/14/2023 and earlier. FINDINGS: Portable AP semi upright view at 0639 hours. Stable cardiomegaly and mediastinal contours. No pericardial effusion on CTA last month. Larger lung volumes. Visualized tracheal air column is within normal limits. Allowing for portable technique the lungs are clear. No pneumothorax or pleural effusion. Stable visualized osseous structures. IMPRESSION: Stable cardiomegaly. No acute cardiopulmonary abnormality. Electronically Signed   By: Marlise Simpers M.D.   On: 08/28/2023  07:00    Cardiac Studies   Echo pending  Patient Profile     53 y.o. male with a hx of chronic combined systolic and diastolic heart failure, cocaine use, tobacco use, essential hypertension, CKD stage II, homelessness, hepatitis C untreated, chronic thrombocytopenia, history of aplastic anemia, noncompliance who is being seen 08/28/2023 for the evaluation of chest pain at the request of Tegeler Christopher.   Assessment & Plan    Chest pain:  No objective evidence of acute ischemia.    Enzymes are mildly elevated but not diagnostic.   No plans for ischemia work up.  Echo was done and we can look for new wall motion abnormality.    I will expedite this.    Chronic systolic HF:  Difficult treatment with his social situation and intermittent absence of meds.  Unable to push OMT with his acute on chronic renal insufficiency.  BNP is actually down.  No overt edema on CXR.  Oxygenating well.  Avoiding  ARB/ARNi/ACE/Spiro.  Started low dose BiDil  yesterday. Avoiding beta blockers with recent cocaine use.    Therapy is limited and I would suggest BiDil .  We will see if our pharmacy can get him some meds to take home.     AKI:  Creat is back down.  No change in therapy.    For questions or updates, please contact [facility] Please consult [email or web address] for contact info under Cardiology/STEMI.   Signed, Eilleen Grates, MD  08/29/2023, 7:59 AM

## 2023-08-29 NOTE — Hospital Course (Signed)
 53 y.o. M with sdCHF EF 30-35%, HTN, CKD IIIb baseline 1.5, homelessness, hep C, chronic thrombocytopenia, and polysubstance abuse who presented with chest pain and dizziness after using cocaine.

## 2023-09-04 ENCOUNTER — Emergency Department (HOSPITAL_COMMUNITY)

## 2023-09-04 ENCOUNTER — Other Ambulatory Visit: Payer: Self-pay

## 2023-09-04 ENCOUNTER — Emergency Department (HOSPITAL_COMMUNITY)
Admission: EM | Admit: 2023-09-04 | Discharge: 2023-09-04 | Disposition: A | Discharge status: Home / Self Care | Attending: Emergency Medicine | Admitting: Emergency Medicine

## 2023-09-04 DIAGNOSIS — R0689 Other abnormalities of breathing: Secondary | ICD-10-CM | POA: Diagnosis not present

## 2023-09-04 DIAGNOSIS — J449 Chronic obstructive pulmonary disease, unspecified: Secondary | ICD-10-CM | POA: Insufficient documentation

## 2023-09-04 DIAGNOSIS — Z79899 Other long term (current) drug therapy: Secondary | ICD-10-CM | POA: Diagnosis not present

## 2023-09-04 DIAGNOSIS — R609 Edema, unspecified: Secondary | ICD-10-CM | POA: Diagnosis not present

## 2023-09-04 DIAGNOSIS — I110 Hypertensive heart disease with heart failure: Secondary | ICD-10-CM | POA: Diagnosis not present

## 2023-09-04 DIAGNOSIS — R0789 Other chest pain: Secondary | ICD-10-CM | POA: Diagnosis not present

## 2023-09-04 DIAGNOSIS — Z91148 Patient's other noncompliance with medication regimen for other reason: Secondary | ICD-10-CM | POA: Insufficient documentation

## 2023-09-04 DIAGNOSIS — R0602 Shortness of breath: Secondary | ICD-10-CM | POA: Diagnosis not present

## 2023-09-04 DIAGNOSIS — R42 Dizziness and giddiness: Secondary | ICD-10-CM | POA: Insufficient documentation

## 2023-09-04 DIAGNOSIS — F1491 Cocaine use, unspecified, in remission: Secondary | ICD-10-CM | POA: Insufficient documentation

## 2023-09-04 DIAGNOSIS — I10 Essential (primary) hypertension: Secondary | ICD-10-CM | POA: Diagnosis not present

## 2023-09-04 DIAGNOSIS — I509 Heart failure, unspecified: Secondary | ICD-10-CM | POA: Insufficient documentation

## 2023-09-04 DIAGNOSIS — F1410 Cocaine abuse, uncomplicated: Secondary | ICD-10-CM | POA: Diagnosis not present

## 2023-09-04 DIAGNOSIS — R079 Chest pain, unspecified: Secondary | ICD-10-CM | POA: Insufficient documentation

## 2023-09-04 LAB — CBC
HCT: 41.1 % (ref 39.0–52.0)
Hemoglobin: 13.7 g/dL (ref 13.0–17.0)
MCH: 34.4 pg — ABNORMAL HIGH (ref 26.0–34.0)
MCHC: 33.3 g/dL (ref 30.0–36.0)
MCV: 103.3 fL — ABNORMAL HIGH (ref 80.0–100.0)
Platelets: 129 10*3/uL — ABNORMAL LOW (ref 150–400)
RBC: 3.98 MIL/uL — ABNORMAL LOW (ref 4.22–5.81)
RDW: 16.6 % — ABNORMAL HIGH (ref 11.5–15.5)
WBC: 2.6 10*3/uL — ABNORMAL LOW (ref 4.0–10.5)
nRBC: 0 % (ref 0.0–0.2)

## 2023-09-04 LAB — BASIC METABOLIC PANEL WITH GFR
Anion gap: 9 (ref 5–15)
BUN: 19 mg/dL (ref 6–20)
CO2: 20 mmol/L — ABNORMAL LOW (ref 22–32)
Calcium: 8.7 mg/dL — ABNORMAL LOW (ref 8.9–10.3)
Chloride: 104 mmol/L (ref 98–111)
Creatinine, Ser: 1.71 mg/dL — ABNORMAL HIGH (ref 0.61–1.24)
GFR, Estimated: 48 mL/min — ABNORMAL LOW (ref >60)
Glucose, Bld: 218 mg/dL — ABNORMAL HIGH (ref 70–99)
Potassium: 3.9 mmol/L (ref 3.5–5.1)
Sodium: 133 mmol/L — ABNORMAL LOW (ref 135–145)

## 2023-09-04 LAB — TROPONIN I (HIGH SENSITIVITY)
Troponin I (High Sensitivity): 47 ng/L — ABNORMAL HIGH (ref <18)
Troponin I (High Sensitivity): 41 ng/L — ABNORMAL HIGH (ref <18)

## 2023-09-04 LAB — D-DIMER, QUANTITATIVE: D-Dimer, Quant: 0.52 ug{FEU}/mL — ABNORMAL HIGH (ref 0.00–0.50)

## 2023-09-04 MED ORDER — OXYCODONE-ACETAMINOPHEN 5-325 MG PO TABS
1.0000 | ORAL_TABLET | Freq: Once | ORAL | Status: AC
  Administered 2023-09-04: 1 via ORAL
  Filled 2023-09-04: qty 1

## 2023-09-04 NOTE — ED Provider Notes (Signed)
 [place] EMERGENCY DEPARTMENT AT [facility] Provider Note   CSN: [id number] Arrival date & time: 09/04/23  1231     History  Chief Complaint  Patient presents with   Chest Pain    Charles [name]. is a 53 y.o. male.  Patient is a 53 year old male with a past medical history of CHF with medication noncompliance, hypertension, cocaine use, COPD presenting to the emergency department with chest pain.  Patient states that he was on a cocaine bender for the last few days and then slept for the last 2 days.  He states when he woke up this morning he was having dizziness where he felt like the room was spinning and he was spinning with some shortness of breath.  He states that he got up and started to get dressed and started to develop some left-sided chest pain.  He states it feels like a sharp stabbing pain and has been constant throughout the day.  He denies any fever or cough or lower extremity swelling.  He states he has not been taking any of his normal medications.  He states he last used cocaine 2 days ago.  He states this is similar pain to what he was having when he was last admitted to the hospital a few weeks ago.  The history is provided by the patient.  Chest Pain      Home Medications Prior to Admission medications   Medication Sig Start Date End Date Taking? Authorizing Provider  albuterol  (VENTOLIN  HFA) 108 (90 Base) MCG/ACT inhaler Inhale 2 puffs into the lungs every 4 (four) hours as needed for wheezing or shortness of breath. 08/05/23   Arrien, Curlee Doss, MD  amLODipine  (NORVASC ) 10 MG tablet Take 1 tablet (10 mg total) by mouth daily. 08/29/23 11/27/23  Danford, Willis Harter, MD  empagliflozin  (JARDIANCE ) 10 MG TABS tablet Take 1 tablet (10 mg total) by mouth daily. 08/29/23   Danford, Willis Harter, MD  furosemide  (LASIX ) 40 MG tablet Take 1 tablet (40 mg total) by mouth daily. 08/15/23   [place] Butter, PA  gabapentin  (NEURONTIN ) 100 MG capsule  Take 1 capsule (100 mg total) by mouth 2 (two) times daily. 08/05/23 10/04/23  Arrien, Mauricio Daniel, MD  isosorbide -hydrALAZINE  (BIDIL ) 20-37.5 MG tablet Take 1 tablet by mouth 2 (two) times daily. 08/29/23   Danford, Willis Harter, MD  losartan  (COZAAR ) 25 MG tablet Take 1 tablet (25 mg total) by mouth daily. 08/05/23   Arrien, Mauricio Daniel, MD  sodium chloride  (OCEAN) 0.65 % SOLN nasal spray Place 1 spray into both nostrils as needed for congestion. 08/05/23   Arrien, Mauricio Daniel, MD  spironolactone  (ALDACTONE ) 25 MG tablet Take 1 tablet (25 mg total) by mouth daily. 08/29/23 11/27/23  DanfordWillis Harter, MD      Allergies    Banana, Egg-derived products, and Pork-derived products    Review of Systems   Review of Systems  Cardiovascular:  Positive for chest pain.    Physical Exam Updated Vital Signs BP 125/89 (BP Location: Right Arm)   Pulse 81   Temp 98.7 F (37.1 C) (Oral)   Resp 17   Ht 5\' 6"  (1.676 m)   Wt 74 kg   SpO2 100%   BMI 26.33 kg/m  Physical Exam Vitals and nursing note reviewed.  Constitutional:      General: He is not in acute distress.    Appearance: He is well-developed.  HENT:     Head: Normocephalic and atraumatic.  Eyes:     Extraocular Movements: Extraocular movements intact.  Cardiovascular:     Rate and Rhythm: Normal rate and regular rhythm.     Pulses:          Radial pulses are 2+ on the right side and 2+ on the left side.     Heart sounds: Normal heart sounds.  Pulmonary:     Effort: Pulmonary effort is normal.     Breath sounds: Normal breath sounds.  Chest:     Chest wall: No tenderness.  Abdominal:     Palpations: Abdomen is soft.  Musculoskeletal:        General: Normal range of motion.     Cervical back: Normal range of motion and neck supple.     Right lower leg: No edema.     Left lower leg: No edema.  Skin:    General: Skin is warm and dry.  Neurological:     General: No focal deficit present.     Mental Status: He  is alert and oriented to person, place, and time.  Psychiatric:        Mood and Affect: Mood normal.        Behavior: Behavior normal.     ED Results / Procedures / Treatments   Labs (all labs ordered are listed, but only abnormal results are displayed) Labs Reviewed  BASIC METABOLIC PANEL WITH GFR - Abnormal; Notable for the following components:      Result Value   Sodium 133 (*)    CO2 20 (*)    Glucose, Bld 218 (*)    Creatinine, Ser 1.71 (*)    Calcium  8.7 (*)    GFR, Estimated 48 (*)    All other components within normal limits  CBC - Abnormal; Notable for the following components:   WBC 2.6 (*)    RBC 3.98 (*)    MCV 103.3 (*)    MCH 34.4 (*)    RDW 16.6 (*)    Platelets 129 (*)    All other components within normal limits  D-DIMER, QUANTITATIVE - Abnormal; Notable for the following components:   D-Dimer, Quant 0.52 (*)    All other components within normal limits  TROPONIN I (HIGH SENSITIVITY) - Abnormal; Notable for the following components:   Troponin I (High Sensitivity) 47 (*)    All other components within normal limits  TROPONIN I (HIGH SENSITIVITY) - Abnormal; Notable for the following components:   Troponin I (High Sensitivity) 41 (*)    All other components within normal limits    EKG EKG Interpretation Date/Time:  Thursday September 04 2023 13:21:33 EDT Ventricular Rate:  84 PR Interval:  156 QRS Duration:  86 QT Interval:  380 QTC Calculation: 449 R Axis:   17  Text Interpretation: Sinus rhythm with occasional Premature ventricular complexes Left ventricular hypertrophy with repolarization abnormality ( R in aVL , Sokolow-Lyon , Cornell product , Romhilt-Estes ) Cannot rule out Septal infarct , age undetermined Abnormal ECG  Previous inferior t-wave inversions now upright inferiorly Confirmed by Celesta Coke ([id number]) on 09/04/2023 3:03:47 PM  Radiology DG Chest 2 View Result Date: 09/04/2023 CLINICAL DATA:  Chest pain EXAM: CHEST - 2 VIEW  COMPARISON:  August 28, 2023 FINDINGS: The heart size and mediastinal contours are within normal limits. Both lungs are clear. The visualized skeletal structures are unremarkable. IMPRESSION: No active cardiopulmonary disease. Electronically Signed   By: Fredrich Jefferson M.D.   On: 09/04/2023 14:31    Procedures  Procedures    Medications Ordered in ED Medications  oxyCODONE -acetaminophen  (PERCOCET/ROXICET) 5-325 MG per tablet 1 tablet (1 tablet Oral Given 09/04/23 1541)    ED Course/ Medical Decision Making/ A&P Clinical Course as of 09/04/23 1827  Thu Sep 04, 2023  1735 Repeat troponin is downtrending, age adjusted d-dimer is negative. Patient recommended to resume home medications and stop cocaine use. He is stable for discharge home with outpatient follow up. [VK]    Clinical Course User Index [VK] Kingsley, Brodi Kari K, DO                                 Medical Decision Making This patient presents to the ED with chief complaint(s) of chest pain with pertinent past medical history of cocaine use, COPD, CHF with medication noncompliance, hypertension which further complicates the presenting complaint. The complaint involves an extensive differential diagnosis and also carries with it a high risk of complications and morbidity.    The differential diagnosis includes cocaine chest pain, ACS, arrhythmia, anemia, pneumonia, pneumothorax, pulmonary edema, pleural effusion, considering PE with recent hospitalization, musculoskeletal pain, dehydration, electrolyte abnormality  Additional history obtained: Additional history obtained from N/A Records reviewed previous admission documents  ED Course and Reassessment: On patient's arrival he was hemodynamically stable in no acute distress.  He was initially evaluated by provider in triage and had EKG, chest x-ray and labs performed.  EKG had nonspecific T wave changes.  Initial troponin was elevated at 47.  The patient has mild hyperglycemia,  creatinine is approximately at baseline.  With patient's recent hospitalization well D-dimer added on.  Will need repeat troponin.  Will be given pain control and will be closely reassessed.  Independent labs interpretation:  The following labs were independently interpreted: mildly elevated troponin -> downtrending on repeat. D-dimer negative for age, otherwise labs at baseline  Independent visualization of imaging: - I independently visualized the following imaging with scope of interpretation limited to determining acute life threatening conditions related to emergency care: CXR, which revealed no acute disease  Consultation: - Consulted or discussed management/test interpretation w/ external professional: N/A  Consideration for admission or further workup: Patient has no emergent conditions requiring admission or further work-up at this time and is stable for discharge home with primary care and cardiology follow-up  Social Determinants of health: cocaine use disorder    Amount and/or Complexity of Data Reviewed Labs: ordered. Radiology: ordered.  Risk Prescription drug management.          Final Clinical Impression(s) / ED Diagnoses Final diagnoses:  Nonspecific chest pain  History of medication noncompliance  History of cocaine use    Rx / DC Orders ED Discharge Orders          Ordered    Ambulatory referral to Cardiology       Comments: If you have not heard from the Cardiology office within the next 72 hours please call [phone].   09/04/23 1825              Nolberto Batty, DO 09/04/23 1827

## 2023-09-04 NOTE — ED Provider Triage Note (Signed)
 Emergency Medicine Provider Triage Evaluation Note  Charles Nan. , a 53 y.o. male  was evaluated in triage.  Pt complains of chest pain and pain all over..  Review of Systems  Positive: Chest pain Negative: Fevers  Physical Exam  BP 125/89 (BP Location: Right Arm)   Pulse 81   Temp 98.7 F (37.1 C) (Oral)   Resp 17   Ht 5\' 6"  (1.676 m)   Wt 74 kg   SpO2 100%   BMI 26.33 kg/m  Chest tenderness  Medical Decision Making  Medically screening exam initiated at 1:47 PM.  Appropriate orders placed.  Charles Somera. was informed that the remainder of the evaluation will be completed by another provider, this initial triage assessment does not replace that evaluation, and the importance of remaining in the ED until their evaluation is complete.  Patient with chest pain.  Reviewed recent discharge note.  Recent admission for cocaine chest pain.  EKG shows stable to before or perhaps improved.  Will get x-ray EKG and blood work.   Charles Arias, MD 09/04/23 1348

## 2023-09-04 NOTE — Discharge Instructions (Addendum)
 You were seen in the emergency department for your chest pain.  Your workup today showed no signs of heart attack, your heart enzyme was mildly elevated but was decreasing on repeat.  Your chest x-ray looked normal.  You need to start taking all of your daily medications as prescribed to help prevent recurrent symptoms and you should stop using cocaine to help prevent recurrent symptoms.  You should follow-up with cardiology as an outpatient for further management of your symptoms.  You should return to the emergency department for significantly worsening pain, severe shortness of breath, if you pass out or if you have any other new or concerning symptoms.

## 2023-09-04 NOTE — ED Triage Notes (Signed)
 Pt woke up with left sided sharp chest pain worse with inspiration. Pt has been awake and using cocaine for the last week. Then asleep for two days. Woke up with CP. Pt also dizzy. Pt has not been taking lasix  so he also has swelling to right hand. Denies fall or injury.

## 2023-09-04 NOTE — ED Notes (Signed)
 Pt. Stated, "I don't want you talking to me. I don't talk to men, I only talk to women. I'm not gay." RN aware

## 2023-09-08 ENCOUNTER — Telehealth: Payer: Self-pay

## 2023-09-08 NOTE — Transitions of Care (Post Inpatient/ED Visit) (Unsigned)
   09/08/2023  Name: Charles [name]. MRN: [id number] DOB: [birth year]  Today's TOC FU Call Status: Today's TOC FU Call Status:: Unsuccessful Call (1st Attempt) Unsuccessful Call (1st Attempt) Date: 09/08/23  Attempted to reach the patient regarding the most recent Inpatient/ED visit.  Follow Up Plan: Additional outreach attempts will be made to reach the patient to complete the Transitions of Care (Post Inpatient/ED visit) call.   Signature Darrall Ellison, LPN [facility] Nurse Health Advisor Direct Dial [phone]

## 2023-09-09 ENCOUNTER — Other Ambulatory Visit (HOSPITAL_COMMUNITY): Payer: Self-pay

## 2023-09-09 ENCOUNTER — Encounter (HOSPITAL_COMMUNITY): Payer: Self-pay | Admitting: Emergency Medicine

## 2023-09-09 ENCOUNTER — Other Ambulatory Visit: Payer: Self-pay

## 2023-09-09 ENCOUNTER — Emergency Department (HOSPITAL_COMMUNITY)

## 2023-09-09 ENCOUNTER — Emergency Department (HOSPITAL_COMMUNITY)
Admission: EM | Admit: 2023-09-09 | Discharge: 2023-09-09 | Disposition: A | Discharge status: Home / Self Care | Attending: Emergency Medicine | Admitting: Emergency Medicine

## 2023-09-09 DIAGNOSIS — I110 Hypertensive heart disease with heart failure: Secondary | ICD-10-CM | POA: Insufficient documentation

## 2023-09-09 DIAGNOSIS — M545 Low back pain, unspecified: Secondary | ICD-10-CM | POA: Diagnosis not present

## 2023-09-09 DIAGNOSIS — I517 Cardiomegaly: Secondary | ICD-10-CM | POA: Diagnosis not present

## 2023-09-09 DIAGNOSIS — R0789 Other chest pain: Secondary | ICD-10-CM | POA: Insufficient documentation

## 2023-09-09 DIAGNOSIS — R7989 Other specified abnormal findings of blood chemistry: Secondary | ICD-10-CM | POA: Diagnosis not present

## 2023-09-09 DIAGNOSIS — I771 Stricture of artery: Secondary | ICD-10-CM | POA: Diagnosis not present

## 2023-09-09 DIAGNOSIS — Z79899 Other long term (current) drug therapy: Secondary | ICD-10-CM | POA: Insufficient documentation

## 2023-09-09 DIAGNOSIS — M549 Dorsalgia, unspecified: Secondary | ICD-10-CM | POA: Diagnosis not present

## 2023-09-09 DIAGNOSIS — I5042 Chronic combined systolic (congestive) and diastolic (congestive) heart failure: Secondary | ICD-10-CM | POA: Insufficient documentation

## 2023-09-09 DIAGNOSIS — M25511 Pain in right shoulder: Secondary | ICD-10-CM | POA: Insufficient documentation

## 2023-09-09 DIAGNOSIS — R739 Hyperglycemia, unspecified: Secondary | ICD-10-CM | POA: Diagnosis not present

## 2023-09-09 DIAGNOSIS — R0602 Shortness of breath: Secondary | ICD-10-CM | POA: Diagnosis not present

## 2023-09-09 LAB — COMPREHENSIVE METABOLIC PANEL WITH GFR
ALT: 19 U/L (ref 0–44)
AST: 36 U/L (ref 15–41)
Albumin: 3.1 g/dL — ABNORMAL LOW (ref 3.5–5.0)
Alkaline Phosphatase: 57 U/L (ref 38–126)
Anion gap: 8 (ref 5–15)
BUN: 23 mg/dL — ABNORMAL HIGH (ref 6–20)
CO2: 26 mmol/L (ref 22–32)
Calcium: 8.9 mg/dL (ref 8.9–10.3)
Chloride: 103 mmol/L (ref 98–111)
Creatinine, Ser: 1.48 mg/dL — ABNORMAL HIGH (ref 0.61–1.24)
GFR, Estimated: 57 mL/min — ABNORMAL LOW (ref >60)
Glucose, Bld: 111 mg/dL — ABNORMAL HIGH (ref 70–99)
Potassium: 4.7 mmol/L (ref 3.5–5.1)
Sodium: 137 mmol/L (ref 135–145)
Total Bilirubin: 1.2 mg/dL (ref 0.0–1.2)
Total Protein: 6.7 g/dL (ref 6.5–8.1)

## 2023-09-09 LAB — CBC
HCT: 41.6 % (ref 39.0–52.0)
Hemoglobin: 14 g/dL (ref 13.0–17.0)
MCH: 34.6 pg — ABNORMAL HIGH (ref 26.0–34.0)
MCHC: 33.7 g/dL (ref 30.0–36.0)
MCV: 102.7 fL — ABNORMAL HIGH (ref 80.0–100.0)
Platelets: 168 10*3/uL (ref 150–400)
RBC: 4.05 MIL/uL — ABNORMAL LOW (ref 4.22–5.81)
RDW: 16.8 % — ABNORMAL HIGH (ref 11.5–15.5)
WBC: 2.2 10*3/uL — ABNORMAL LOW (ref 4.0–10.5)
nRBC: 0 % (ref 0.0–0.2)

## 2023-09-09 LAB — RESP PANEL BY RT-PCR (RSV, FLU A&B, COVID)  RVPGX2
Influenza A by PCR: NEGATIVE
Influenza B by PCR: NEGATIVE
Resp Syncytial Virus by PCR: NEGATIVE
SARS Coronavirus 2 by RT PCR: NEGATIVE

## 2023-09-09 LAB — TROPONIN I (HIGH SENSITIVITY)
Troponin I (High Sensitivity): 41 ng/L — ABNORMAL HIGH (ref <18)
Troponin I (High Sensitivity): 39 ng/L — ABNORMAL HIGH (ref <18)

## 2023-09-09 LAB — BRAIN NATRIURETIC PEPTIDE: B Natriuretic Peptide: 493.7 pg/mL — ABNORMAL HIGH (ref 0.0–100.0)

## 2023-09-09 MED ORDER — METHYLPREDNISOLONE 4 MG PO TBPK
ORAL_TABLET | ORAL | 0 refills | Status: DC
  Filled 2023-09-09: qty 21, 6d supply, fill #0

## 2023-09-09 NOTE — Discharge Instructions (Signed)
 Your workup today was reassuring.  Take the steroids as prescribed and follow-up with your doctor.  Return to the ER for worsening symptoms.

## 2023-09-09 NOTE — ED Provider Notes (Signed)
 [name] Provider Note   CSN: [id number] Arrival date & time: 09/09/23  1212     History  Chief Complaint  Patient presents with   Shortness of Breath    Charles [name]. is a 53 y.o. male.   Shortness of Breath Patient presents with multiple complaints.  Pain in low back going to both lower extremities.  No injury.  States worse with walking. Also pain in right shoulder.  States had previous injury and now hurts.  Also shortness of breath.  No fevers.  No cough.    Past Medical History:  Diagnosis Date   Acute metabolic encephalopathy 07/14/2023   Aplastic anemia (HCC)    Arthritis    "left ankle" (10/17/2014)   Bilateral pneumonia 10/17/2014   Chronic combined systolic and diastolic heart failure (HCC)    Hepatitis    "think it was B" (10/17/2014)   History of blood transfusion "several"   "related to aplastic anemia"   Hypertension    Laceration of spleen    s/p embolization   Polysubstance abuse (HCC)    cocaine, benzo's, opiates, THC   Sleep apnea    "wore mask in prison; 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" (10/17/2014)    Home Medications Prior to Admission medications   Medication Sig Start Date End Date Taking? Authorizing Provider  albuterol  (VENTOLIN  HFA) 108 (90 Base) MCG/ACT inhaler Inhale 2 puffs into the lungs every 4 (four) hours as needed for wheezing or shortness of breath. 08/05/23   Arrien, Curlee Doss, MD  amLODipine  (NORVASC ) 10 MG tablet Take 1 tablet (10 mg total) by mouth daily. 08/29/23 11/27/23  DanfordWillis Harter, MD  empagliflozin  (JARDIANCE ) 10 MG TABS tablet Take 1 tablet (10 mg total) by mouth daily. 08/29/23   Danford, Willis Harter, MD  furosemide  (LASIX ) 40 MG tablet Take 1 tablet (40 mg total) by mouth daily. 08/15/23   [place] Butter, PA  gabapentin  (NEURONTIN ) 100 MG capsule Take 1 capsule (100 mg total) by mouth 2 (two) times daily. 08/05/23 10/04/23  Arrien, Mauricio Daniel, MD   isosorbide -hydrALAZINE  (BIDIL ) 20-37.5 MG tablet Take 1 tablet by mouth 2 (two) times daily. 08/29/23   Danford, Willis Harter, MD  losartan  (COZAAR ) 25 MG tablet Take 1 tablet (25 mg total) by mouth daily. 08/05/23   Arrien, Mauricio Daniel, MD  sodium chloride  (OCEAN) 0.65 % SOLN nasal spray Place 1 spray into both nostrils as needed for congestion. 08/05/23   Arrien, Mauricio Daniel, MD  spironolactone  (ALDACTONE ) 25 MG tablet Take 1 tablet (25 mg total) by mouth daily. 08/29/23 11/27/23  DanfordWillis Harter, MD      Allergies    Banana, Egg-derived products, and Pork-derived products    Review of Systems   Review of Systems  Respiratory:  Positive for shortness of breath.     Physical Exam Updated Vital Signs BP (!) 143/110 (BP Location: Right Arm)   Pulse 82   Temp 98 F (36.7 C) (Oral)   Resp 18   SpO2 100%  Physical Exam Nursing note reviewed.  Cardiovascular:     Rate and Rhythm: Regular rhythm.  Pulmonary:     Breath sounds: No wheezing, rhonchi or rales.  Chest:     Chest wall: Tenderness present.     Comments: Tenderness right upper chest wall. Abdominal:     Tenderness: There is no abdominal tenderness.  Musculoskeletal:     Comments: Some lumbar/posterior pelvic tenderness.  No step-off.  No  peripheral edema.  Neurological:     Mental Status: He is alert.     ED Results / Procedures / Treatments   Labs (all labs ordered are listed, but only abnormal results are displayed) Labs Reviewed  CBC - Abnormal; Notable for the following components:      Result Value   WBC 2.2 (*)    RBC 4.05 (*)    MCV 102.7 (*)    MCH 34.6 (*)    RDW 16.8 (*)    All other components within normal limits  BRAIN NATRIURETIC PEPTIDE - Abnormal; Notable for the following components:   B Natriuretic Peptide 493.7 (*)    All other components within normal limits  TROPONIN I (HIGH SENSITIVITY) - Abnormal; Notable for the following components:   Troponin I (High Sensitivity) 41  (*)    All other components within normal limits  TROPONIN I (HIGH SENSITIVITY) - Abnormal; Notable for the following components:   Troponin I (High Sensitivity) 39 (*)    All other components within normal limits  RESP PANEL BY RT-PCR (RSV, FLU A&B, COVID)  RVPGX2  COMPREHENSIVE METABOLIC PANEL WITH GFR    EKG EKG Interpretation Date/Time:  Tuesday September 09 2023 12:29:00 EDT Ventricular Rate:  82 PR Interval:  152 QRS Duration:  80 QT Interval:  402 QTC Calculation: 469 R Axis:   65  Text Interpretation: Normal sinus rhythm Left ventricular hypertrophy with repolarization abnormality ( Cornell product ) Abnormal ECG When compared with ECG of 04-Sep-2023 13:21, No significant change since last tracing Confirmed by Mozell Arias [id number]) on 09/09/2023 1:34:47 PM  Radiology DG Chest 1 View Result Date: 09/09/2023 CLINICAL DATA:  141880 SOB (shortness of breath) 141880 EXAM: CHEST  1 VIEW COMPARISON:  September 04, 2023 FINDINGS: No focal airspace consolidation, pleural effusion, or pneumothorax. Mild cardiomegaly. Tortuous aorta. No acute fracture or destructive lesions. Multilevel thoracic osteophytosis. IMPRESSION: No acute cardiopulmonary abnormality. Electronically Signed   By: Rance Burrows M.D.   On: 09/09/2023 14:02    Procedures Procedures    Medications Ordered in ED Medications - No data to display  ED Course/ Medical Decision Making/ A&P                                 Medical Decision Making Amount and/or Complexity of Data Reviewed Labs: ordered. Radiology: ordered.  Patient also complaints.  Back pain leg pain shoulder pain shortness of breath. Multiple of his history of similar symptoms.  EKG reassuring.  Will get chest x-ray and blood work.  chest x-ray reassuring.  CBC at baseline.  BNP is elevated but lower than it has been at times.  Troponin stable at about 40 which appears to be his baseline.  Still waiting on BNP.  Care turned over to Dr.   Charlee Conine.       Final Clinical Impression(s) / ED Diagnoses Final diagnoses:  Acute midline back pain, unspecified back location    Rx / DC Orders ED Discharge Orders     None         Mozell Arias, MD 09/09/23 1625

## 2023-09-09 NOTE — ED Notes (Signed)
 According to the pt he began to experience right lower back pain during the night while he was asleep. Pt was awaken at "1100" with left  lower back pain that radiates "all the way down to the calf." Pt also reports SOB on exertion first around 1100.

## 2023-09-09 NOTE — ED Provider Notes (Signed)
 Patient signed out by Dr. Val Garin.  Presenting with back pain with history of chronic back pain that improved with steroids in the past.  Patient had a workup performed including troponins that were negative.  The patient's CMP is pending at time of signout.  This is unremarkable.  The patient is discharged with return precautions.  Physical Exam  BP 129/89 (BP Location: Right Arm)   Pulse 95   Temp 99.1 F (37.3 C)   Resp 14   SpO2 98%   Physical Exam General: No acute distress  Procedures  Procedures  ED Course / MDM    Medical Decision Making Amount and/or Complexity of Data Reviewed Labs: ordered. Radiology: ordered.          Carin Charleston, MD 09/09/23 1739

## 2023-09-09 NOTE — ED Notes (Signed)
 Pt refused to answer assessment questions and requested to not be bothered. Will attempt to assess and treat pt in .

## 2023-09-09 NOTE — Transitions of Care (Post Inpatient/ED Visit) (Unsigned)
   09/09/2023  Name: Charles [name]. MRN: [id number] DOB: 1971-04-17  Today's TOC FU Call Status: Today's TOC FU Call Status:: Unsuccessful Call (2nd Attempt) Unsuccessful Call (1st Attempt) Date: 09/08/23 Unsuccessful Call (2nd Attempt) Date: 09/09/23  Attempted to reach the patient regarding the most recent Inpatient/ED visit.  Follow Up Plan: Additional outreach attempts will be made to reach the patient to complete the Transitions of Care (Post Inpatient/ED visit) call.   Signature Darrall Ellison, LPN [facility] Nurse Health Advisor Direct Dial [phone]

## 2023-09-09 NOTE — ED Triage Notes (Signed)
 Pt BIB by EMS for [facility] and left lower back pain that radiates down LLE. Denies any injury, trauma, or overexertion. Hx of drug use, according to chart. No acute distress noted on arrival.

## 2023-09-09 NOTE — ED Notes (Signed)
 Pt stated intention to return to [place] to check in after being discharged. Pt was given a bus voucher and per charge nurse escorted to bus top.

## 2023-09-10 ENCOUNTER — Telehealth: Payer: Self-pay

## 2023-09-10 NOTE — Transitions of Care (Post Inpatient/ED Visit) (Signed)
   09/10/2023  Name: Charles [name]. MRN: [id number] DOB: 08-04-1970  Today's TOC FU Call Status: Today's TOC FU Call Status:: Unsuccessful Call (1st Attempt) Unsuccessful Call (1st Attempt) Date: 09/10/23  Attempted to reach the patient regarding the most recent Inpatient/ED visit.  Follow Up Plan: Additional outreach attempts will be made to reach the patient to complete the Transitions of Care (Post Inpatient/ED visit) call.   Signature Darrall Ellison, LPN [facility] Nurse Health Advisor Direct Dial [phone]

## 2023-09-10 NOTE — Transitions of Care (Post Inpatient/ED Visit) (Signed)
   09/10/2023  Name: Charles [name]. MRN: [id number] DOB: [birth year]  Today's TOC FU Call Status: Today's TOC FU Call Status:: Unsuccessful Call (3rd Attempt) Unsuccessful Call (1st Attempt) Date: 09/08/23 Unsuccessful Call (2nd Attempt) Date: 09/09/23 Unsuccessful Call (3rd Attempt) Date: 09/10/23  Attempted to reach the patient regarding the most recent Inpatient/ED visit.  Follow Up Plan: No further outreach attempts will be made at this time. We have been unable to contact the patient.  Signature Darrall Ellison, LPN [facility] Nurse Health Advisor Direct Dial [phone]

## 2023-09-11 NOTE — Transitions of Care (Post Inpatient/ED Visit) (Signed)
   09/11/2023  Name: Charles [name]. MRN: [id number] DOB: 1971/04/12  Today's TOC FU Call Status: Today's TOC FU Call Status:: Unsuccessful Call (2nd Attempt) Unsuccessful Call (1st Attempt) Date: 09/10/23 Unsuccessful Call (2nd Attempt) Date: 09/11/23  Attempted to reach the patient regarding the most recent Inpatient/ED visit.  Follow Up Plan: Additional outreach attempts will be made to reach the patient to complete the Transitions of Care (Post Inpatient/ED visit) call.   Signature Darrall Ellison, LPN [facility] Nurse Health Advisor Direct Dial [phone]

## 2023-09-12 NOTE — Transitions of Care (Post Inpatient/ED Visit) (Signed)
   09/12/2023  Name: Charles [name]. MRN: [id number] DOB: 02-May-1971  Today's TOC FU Call Status: Today's TOC FU Call Status:: Unsuccessful Call (3rd Attempt) Unsuccessful Call (1st Attempt) Date: 09/10/23 Unsuccessful Call (2nd Attempt) Date: 09/11/23 Unsuccessful Call (3rd Attempt) Date: 09/12/23  Attempted to reach the patient regarding the most recent Inpatient/ED visit.  Follow Up Plan: No further outreach attempts will be made at this time. We have been unable to contact the patient.  Signature Darrall Ellison, LPN [facility] Nurse Health Advisor Direct Dial [phone]

## 2023-09-28 ENCOUNTER — Emergency Department (HOSPITAL_COMMUNITY)

## 2023-09-28 ENCOUNTER — Inpatient Hospital Stay (HOSPITAL_COMMUNITY)
Admission: EM | Admit: 2023-09-28 | Discharge: 2023-09-30 | DRG: 917 | Disposition: A | Discharge status: Home / Self Care | Attending: Internal Medicine | Admitting: Internal Medicine

## 2023-09-28 ENCOUNTER — Encounter (HOSPITAL_COMMUNITY): Payer: Self-pay

## 2023-09-28 ENCOUNTER — Other Ambulatory Visit: Payer: Self-pay

## 2023-09-28 DIAGNOSIS — T405X1A Poisoning by cocaine, accidental (unintentional), initial encounter: Principal | ICD-10-CM | POA: Diagnosis present

## 2023-09-28 DIAGNOSIS — R0602 Shortness of breath: Principal | ICD-10-CM

## 2023-09-28 DIAGNOSIS — B182 Chronic viral hepatitis C: Secondary | ICD-10-CM | POA: Diagnosis present

## 2023-09-28 DIAGNOSIS — Z5900 Homelessness unspecified: Secondary | ICD-10-CM | POA: Diagnosis not present

## 2023-09-28 DIAGNOSIS — I5043 Acute on chronic combined systolic (congestive) and diastolic (congestive) heart failure: Secondary | ICD-10-CM | POA: Diagnosis present

## 2023-09-28 DIAGNOSIS — Z91012 Allergy to eggs: Secondary | ICD-10-CM

## 2023-09-28 DIAGNOSIS — I130 Hypertensive heart and chronic kidney disease with heart failure and stage 1 through stage 4 chronic kidney disease, or unspecified chronic kidney disease: Secondary | ICD-10-CM | POA: Diagnosis present

## 2023-09-28 DIAGNOSIS — Z7984 Long term (current) use of oral hypoglycemic drugs: Secondary | ICD-10-CM | POA: Diagnosis not present

## 2023-09-28 DIAGNOSIS — Z91148 Patient's other noncompliance with medication regimen for other reason: Secondary | ICD-10-CM

## 2023-09-28 DIAGNOSIS — R590 Localized enlarged lymph nodes: Secondary | ICD-10-CM | POA: Diagnosis not present

## 2023-09-28 DIAGNOSIS — M47816 Spondylosis without myelopathy or radiculopathy, lumbar region: Secondary | ICD-10-CM | POA: Diagnosis not present

## 2023-09-28 DIAGNOSIS — D696 Thrombocytopenia, unspecified: Secondary | ICD-10-CM | POA: Diagnosis present

## 2023-09-28 DIAGNOSIS — N1831 Chronic kidney disease, stage 3a: Secondary | ICD-10-CM | POA: Diagnosis present

## 2023-09-28 DIAGNOSIS — F1410 Cocaine abuse, uncomplicated: Secondary | ICD-10-CM | POA: Diagnosis present

## 2023-09-28 DIAGNOSIS — G4730 Sleep apnea, unspecified: Secondary | ICD-10-CM | POA: Diagnosis present

## 2023-09-28 DIAGNOSIS — F17210 Nicotine dependence, cigarettes, uncomplicated: Secondary | ICD-10-CM | POA: Diagnosis present

## 2023-09-28 DIAGNOSIS — J449 Chronic obstructive pulmonary disease, unspecified: Secondary | ICD-10-CM | POA: Diagnosis present

## 2023-09-28 DIAGNOSIS — M545 Low back pain, unspecified: Secondary | ICD-10-CM | POA: Diagnosis not present

## 2023-09-28 DIAGNOSIS — J432 Centrilobular emphysema: Secondary | ICD-10-CM | POA: Diagnosis present

## 2023-09-28 DIAGNOSIS — R7989 Other specified abnormal findings of blood chemistry: Secondary | ICD-10-CM | POA: Diagnosis present

## 2023-09-28 DIAGNOSIS — D631 Anemia in chronic kidney disease: Secondary | ICD-10-CM | POA: Diagnosis present

## 2023-09-28 DIAGNOSIS — D649 Anemia, unspecified: Secondary | ICD-10-CM | POA: Diagnosis not present

## 2023-09-28 DIAGNOSIS — Z79899 Other long term (current) drug therapy: Secondary | ICD-10-CM | POA: Diagnosis not present

## 2023-09-28 DIAGNOSIS — R918 Other nonspecific abnormal finding of lung field: Secondary | ICD-10-CM | POA: Diagnosis not present

## 2023-09-28 DIAGNOSIS — Z91014 Allergy to mammalian meats: Secondary | ICD-10-CM | POA: Diagnosis not present

## 2023-09-28 DIAGNOSIS — M1611 Unilateral primary osteoarthritis, right hip: Secondary | ICD-10-CM | POA: Diagnosis not present

## 2023-09-28 DIAGNOSIS — M549 Dorsalgia, unspecified: Secondary | ICD-10-CM | POA: Diagnosis not present

## 2023-09-28 DIAGNOSIS — I10 Essential (primary) hypertension: Secondary | ICD-10-CM | POA: Diagnosis not present

## 2023-09-28 DIAGNOSIS — I21A1 Myocardial infarction type 2: Secondary | ICD-10-CM | POA: Diagnosis not present

## 2023-09-28 LAB — RAPID URINE DRUG SCREEN, HOSP PERFORMED
Amphetamines: NOT DETECTED
Barbiturates: NOT DETECTED
Benzodiazepines: NOT DETECTED
Cocaine: POSITIVE — AB
Opiates: NOT DETECTED
Tetrahydrocannabinol: NOT DETECTED

## 2023-09-28 LAB — CBC WITH DIFFERENTIAL/PLATELET
Abs Immature Granulocytes: 0.01 10*3/uL (ref 0.00–0.07)
Basophils Absolute: 0 10*3/uL (ref 0.0–0.1)
Basophils Relative: 0 %
Eosinophils Absolute: 0 10*3/uL (ref 0.0–0.5)
Eosinophils Relative: 1 %
HCT: 35.6 % — ABNORMAL LOW (ref 39.0–52.0)
Hemoglobin: 11.5 g/dL — ABNORMAL LOW (ref 13.0–17.0)
Immature Granulocytes: 0 %
Lymphocytes Relative: 23 %
Lymphs Abs: 1.1 10*3/uL (ref 0.7–4.0)
MCH: 33.8 pg (ref 26.0–34.0)
MCHC: 32.3 g/dL (ref 30.0–36.0)
MCV: 104.7 fL — ABNORMAL HIGH (ref 80.0–100.0)
Monocytes Absolute: 0.6 10*3/uL (ref 0.1–1.0)
Monocytes Relative: 11 %
Neutro Abs: 3.2 10*3/uL (ref 1.7–7.7)
Neutrophils Relative %: 65 %
Platelets: 120 10*3/uL — ABNORMAL LOW (ref 150–400)
RBC: 3.4 MIL/uL — ABNORMAL LOW (ref 4.22–5.81)
RDW: 17 % — ABNORMAL HIGH (ref 11.5–15.5)
WBC: 4.9 10*3/uL (ref 4.0–10.5)
nRBC: 0 % (ref 0.0–0.2)

## 2023-09-28 LAB — TROPONIN I (HIGH SENSITIVITY)
Troponin I (High Sensitivity): 60 ng/L — ABNORMAL HIGH (ref <18)
Troponin I (High Sensitivity): 76 ng/L — ABNORMAL HIGH (ref <18)
Troponin I (High Sensitivity): 81 ng/L — ABNORMAL HIGH (ref <18)
Troponin I (High Sensitivity): 63 ng/L — ABNORMAL HIGH (ref <18)

## 2023-09-28 LAB — IRON AND TIBC
Iron: 106 ug/dL (ref 45–182)
Saturation Ratios: 35 % (ref 17.9–39.5)
TIBC: 301 ug/dL (ref 250–450)
UIBC: 195 ug/dL

## 2023-09-28 LAB — FOLATE: Folate: 12.2 ng/mL (ref >5.9)

## 2023-09-28 LAB — COMPREHENSIVE METABOLIC PANEL WITH GFR
ALT: 25 U/L (ref 0–44)
AST: 30 U/L (ref 15–41)
Albumin: 3.3 g/dL — ABNORMAL LOW (ref 3.5–5.0)
Alkaline Phosphatase: 68 U/L (ref 38–126)
Anion gap: 5 (ref 5–15)
BUN: 22 mg/dL — ABNORMAL HIGH (ref 6–20)
CO2: 23 mmol/L (ref 22–32)
Calcium: 8.7 mg/dL — ABNORMAL LOW (ref 8.9–10.3)
Chloride: 108 mmol/L (ref 98–111)
Creatinine, Ser: 1.35 mg/dL — ABNORMAL HIGH (ref 0.61–1.24)
GFR, Estimated: >60 mL/min (ref >60)
Glucose, Bld: 98 mg/dL (ref 70–99)
Potassium: 4 mmol/L (ref 3.5–5.1)
Sodium: 136 mmol/L (ref 135–145)
Total Bilirubin: 0.9 mg/dL (ref 0.0–1.2)
Total Protein: 7 g/dL (ref 6.5–8.1)

## 2023-09-28 LAB — LIPASE, BLOOD: Lipase: 30 U/L (ref 11–51)

## 2023-09-28 LAB — RESP PANEL BY RT-PCR (RSV, FLU A&B, COVID)  RVPGX2
Influenza A by PCR: NEGATIVE
Influenza B by PCR: NEGATIVE
Resp Syncytial Virus by PCR: NEGATIVE
SARS Coronavirus 2 by RT PCR: NEGATIVE

## 2023-09-28 LAB — URINALYSIS, ROUTINE W REFLEX MICROSCOPIC
Bilirubin Urine: NEGATIVE
Glucose, UA: NEGATIVE mg/dL
Hgb urine dipstick: NEGATIVE
Ketones, ur: NEGATIVE mg/dL
Leukocytes,Ua: NEGATIVE
Nitrite: NEGATIVE
Protein, ur: NEGATIVE mg/dL
Specific Gravity, Urine: 1.011 (ref 1.005–1.030)
pH: 6 (ref 5.0–8.0)

## 2023-09-28 LAB — BRAIN NATRIURETIC PEPTIDE: B Natriuretic Peptide: 1169.7 pg/mL — ABNORMAL HIGH (ref 0.0–100.0)

## 2023-09-28 LAB — VITAMIN B12: Vitamin B-12: 238 pg/mL (ref 180–914)

## 2023-09-28 LAB — MAGNESIUM: Magnesium: 2.2 mg/dL (ref 1.7–2.4)

## 2023-09-28 LAB — FERRITIN: Ferritin: 76 ng/mL (ref 24–336)

## 2023-09-28 MED ORDER — AMLODIPINE BESYLATE 10 MG PO TABS
10.0000 mg | ORAL_TABLET | Freq: Every day | ORAL | Status: DC
  Administered 2023-09-28 – 2023-09-30 (×3): 10 mg via ORAL
  Filled 2023-09-28: qty 1
  Filled 2023-09-28: qty 2
  Filled 2023-09-28: qty 1

## 2023-09-28 MED ORDER — ALBUTEROL SULFATE (2.5 MG/3ML) 0.083% IN NEBU: 3.0000 mL | INHALATION_SOLUTION | RESPIRATORY_TRACT | Status: DC | PRN

## 2023-09-28 MED ORDER — RIVAROXABAN 10 MG PO TABS
10.0000 mg | ORAL_TABLET | Freq: Every day | ORAL | Status: DC
  Administered 2023-09-28 – 2023-09-30 (×3): 10 mg via ORAL
  Filled 2023-09-28 (×3): qty 1

## 2023-09-28 MED ORDER — SPIRONOLACTONE 25 MG PO TABS
25.0000 mg | ORAL_TABLET | Freq: Every day | ORAL | Status: DC
  Administered 2023-09-28 – 2023-09-30 (×3): 25 mg via ORAL
  Filled 2023-09-28 (×3): qty 1

## 2023-09-28 MED ORDER — FUROSEMIDE 10 MG/ML IJ SOLN
40.0000 mg | Freq: Once | INTRAMUSCULAR | Status: AC
  Administered 2023-09-28: 40 mg via INTRAVENOUS
  Filled 2023-09-28: qty 4

## 2023-09-28 MED ORDER — IOHEXOL 350 MG/ML SOLN
75.0000 mL | Freq: Once | INTRAVENOUS | Status: AC | PRN
  Administered 2023-09-28: 75 mL via INTRAVENOUS

## 2023-09-28 MED ORDER — ACETAMINOPHEN 650 MG RE SUPP: 650.0000 mg | Freq: Four times a day (QID) | RECTAL | Status: DC | PRN

## 2023-09-28 MED ORDER — ACETAMINOPHEN 325 MG PO TABS
650.0000 mg | ORAL_TABLET | Freq: Four times a day (QID) | ORAL | Status: DC | PRN
  Administered 2023-09-30: 650 mg via ORAL
  Filled 2023-09-28: qty 2

## 2023-09-28 MED ORDER — LORAZEPAM 1 MG PO TABS
1.0000 mg | ORAL_TABLET | Freq: Once | ORAL | Status: AC
  Administered 2023-09-28: 1 mg via ORAL
  Filled 2023-09-28: qty 1

## 2023-09-28 MED ORDER — ISOSORB DINITRATE-HYDRALAZINE 20-37.5 MG PO TABS
1.0000 | ORAL_TABLET | Freq: Two times a day (BID) | ORAL | Status: DC
  Administered 2023-09-28 – 2023-09-30 (×5): 1 via ORAL
  Filled 2023-09-28 (×6): qty 1

## 2023-09-28 MED ORDER — SODIUM CHLORIDE 0.9 % IV BOLUS
500.0000 mL | Freq: Once | INTRAVENOUS | Status: AC
  Administered 2023-09-28: 500 mL via INTRAVENOUS

## 2023-09-28 MED ORDER — LOSARTAN POTASSIUM 25 MG PO TABS
25.0000 mg | ORAL_TABLET | Freq: Every day | ORAL | Status: DC
  Administered 2023-09-28 – 2023-09-30 (×3): 25 mg via ORAL
  Filled 2023-09-28 (×3): qty 1

## 2023-09-28 MED ORDER — LORAZEPAM 2 MG/ML IJ SOLN: 1.0000 mg | Freq: Once | INTRAMUSCULAR | Status: DC

## 2023-09-28 MED ORDER — GABAPENTIN 100 MG PO CAPS
100.0000 mg | ORAL_CAPSULE | Freq: Once | ORAL | Status: AC
  Administered 2023-09-28: 100 mg via ORAL
  Filled 2023-09-28: qty 1

## 2023-09-28 MED ORDER — KETOROLAC TROMETHAMINE 30 MG/ML IJ SOLN
15.0000 mg | Freq: Once | INTRAMUSCULAR | Status: AC
  Administered 2023-09-28: 15 mg via INTRAVENOUS
  Filled 2023-09-28: qty 1

## 2023-09-28 MED ORDER — FUROSEMIDE 10 MG/ML IJ SOLN
40.0000 mg | Freq: Two times a day (BID) | INTRAMUSCULAR | Status: DC
Start: 2023-09-28 — End: 2023-09-30
  Administered 2023-09-28 – 2023-09-30 (×4): 40 mg via INTRAVENOUS
  Filled 2023-09-28 (×4): qty 4

## 2023-09-28 MED ORDER — EMPAGLIFLOZIN 10 MG PO TABS
10.0000 mg | ORAL_TABLET | Freq: Every day | ORAL | Status: DC
  Administered 2023-09-28 – 2023-09-30 (×3): 10 mg via ORAL
  Filled 2023-09-28 (×3): qty 1

## 2023-09-28 NOTE — ED Notes (Signed)
 Pt noted to eat ice chips and a can of [place] sausages.

## 2023-09-28 NOTE — ED Notes (Signed)
 Patient transported to X-ray

## 2023-09-28 NOTE — ED Notes (Signed)
Lab made aware of add-on labs

## 2023-09-28 NOTE — ED Notes (Signed)
 Pt noted to be sleeping soundly before this writer could finish her assessment.

## 2023-09-28 NOTE — ED Triage Notes (Signed)
 Per EMS, Pt, from street,  c/o SOB and generalized body aches x3 days.  Pain score 5/10.  Pt reports he is supposed to take medication but doesn't.   NAD noted.

## 2023-09-28 NOTE — ED Notes (Signed)
 Pt noted to wake up long enough to ask for ice chips and pain medication, but falls promptly back to sleep.  NAD noted.

## 2023-09-28 NOTE — ED Notes (Signed)
 Pt given a sandwich, crackers, and a drink.

## 2023-09-28 NOTE — ED Notes (Signed)
 When this writer entered to update Pt on care plan, Pt noted to be sleeping soundly.  Bed and floor noted to be covered in crumbs from peanut butter crackers and an empty wrapper was noted in the bed.
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
## 2023-09-28 NOTE — ED Notes (Signed)
Pt given ice chips w/ EDP permission.

## 2023-09-28 NOTE — ED Provider Notes (Signed)
 [place] EMERGENCY DEPARTMENT AT [facility] Provider Note   CSN: [id number] Arrival date & time: 09/28/23  3086     History  Chief Complaint  Patient presents with   Generalized Body Aches   Shortness of Breath    Charles [name]. is a 53 y.o. male.  HPI   53 year old male presents emergency department with concern for shortness of breath, productive cough, body aches, chills for the past 3 days.  Patient is sleeping on exam, easily arousable and oriented but otherwise a poor historian.  He also endorses whole body pain, maybe some vomiting/diarrhea.  Does not appear to be compliant with medications.  Admits to active crack cocaine use, cannot tell me his last use.  Home Medications Prior to Admission medications   Medication Sig Start Date End Date Taking? Authorizing Provider  albuterol  (VENTOLIN  HFA) 108 (90 Base) MCG/ACT inhaler Inhale 2 puffs into the lungs every 4 (four) hours as needed for wheezing or shortness of breath. 08/05/23   Arrien, Curlee Doss, MD  amLODipine  (NORVASC ) 10 MG tablet Take 1 tablet (10 mg total) by mouth daily. 08/29/23 11/27/23  Danford, Willis Harter, MD  empagliflozin  (JARDIANCE ) 10 MG TABS tablet Take 1 tablet (10 mg total) by mouth daily. 08/29/23   Danford, Willis Harter, MD  furosemide  (LASIX ) 40 MG tablet Take 1 tablet (40 mg total) by mouth daily. 08/15/23   [place] Butter, PA  gabapentin  (NEURONTIN ) 100 MG capsule Take 1 capsule (100 mg total) by mouth 2 (two) times daily. 08/05/23 10/04/23  Arrien, Mauricio Daniel, MD  isosorbide -hydrALAZINE  (BIDIL ) 20-37.5 MG tablet Take 1 tablet by mouth 2 (two) times daily. 08/29/23   Danford, Willis Harter, MD  losartan  (COZAAR ) 25 MG tablet Take 1 tablet (25 mg total) by mouth daily. 08/05/23   Arrien, Curlee Doss, MD  methylPREDNISolone  (MEDROL  DOSEPAK) 4 MG TBPK tablet Take by mouth as directed on package 09/09/23   Carin Charleston, MD  sodium chloride  (OCEAN) 0.65 % SOLN nasal spray  Place 1 spray into both nostrils as needed for congestion. 08/05/23   Arrien, Curlee Doss, MD  spironolactone  (ALDACTONE ) 25 MG tablet Take 1 tablet (25 mg total) by mouth daily. 08/29/23 11/27/23  DanfordWillis Harter, MD      Allergies    Banana, Egg-derived products, and Pork-derived products    Review of Systems   Review of Systems  Constitutional:  Positive for chills and fatigue. Negative for fever.  Respiratory:  Positive for cough and shortness of breath.   Cardiovascular:  Negative for chest pain.  Gastrointestinal:  Positive for abdominal pain, diarrhea and vomiting.  Musculoskeletal:  Positive for myalgias.  Skin:  Negative for rash.  Neurological:  Negative for headaches.    Physical Exam Updated Vital Signs BP (!) 154/105   Pulse 93   Temp (!) 97.5 F (36.4 C) (Oral)   Resp 17   Ht 5\' 6"  (1.676 m)   Wt 73.9 kg   SpO2 92%   BMI 26.31 kg/m  Physical Exam Vitals and nursing note reviewed.  Constitutional:      Appearance: Normal appearance.     Comments: Sleepy on exam but easily arousable and oriented  HENT:     Head: Normocephalic.     Mouth/Throat:     Mouth: Mucous membranes are moist.  Eyes:     Pupils: Pupils are equal, round, and reactive to light.  Cardiovascular:     Rate and Rhythm: Normal rate.  Pulmonary:  Effort: Pulmonary effort is normal. No respiratory distress.     Breath sounds: Examination of the right-middle field reveals rhonchi. Examination of the right-lower field reveals decreased breath sounds. Examination of the left-lower field reveals decreased breath sounds. Decreased breath sounds and rhonchi present.  Abdominal:     Palpations: Abdomen is soft.     Tenderness: There is no abdominal tenderness.  Skin:    General: Skin is warm.  Neurological:     Mental Status: He is alert and oriented to person, place, and time. Mental status is at baseline.  Psychiatric:        Mood and Affect: Mood normal.     ED Results /  Procedures / Treatments   Labs (all labs ordered are listed, but only abnormal results are displayed) Labs Reviewed  RESP PANEL BY RT-PCR (RSV, FLU A&B, COVID)  [id number]  CBC WITH DIFFERENTIAL/PLATELET  COMPREHENSIVE METABOLIC PANEL WITH GFR  LIPASE, BLOOD  MAGNESIUM  URINALYSIS, ROUTINE W REFLEX MICROSCOPIC  RAPID URINE DRUG SCREEN, HOSP PERFORMED    EKG EKG Interpretation Date/Time:  Sunday Sep 28 2023 07:16:55 EDT Ventricular Rate:  88 PR Interval:  184 QRS Duration:  78 QT Interval:  418 QTC Calculation: 506 R Axis:   26  Text Interpretation: Sinus rhythm Probable left atrial enlargement Left ventricular hypertrophy Anterior infarct, old Nonspecific T abnormalities, lateral leads Prolonged QT interval Confirmed by Florentino Hurdle [phone]) on 09/28/2023 8:32:31 AM  Radiology DG Chest 2 View Result Date: 09/28/2023 CLINICAL DATA:  Shortness of breath.  Body aches. EXAM: CHEST - 2 VIEW COMPARISON:  09/09/2023 FINDINGS: The cardio pericardial silhouette is enlarged. Soft tissue fullness noted in the right hilar region, new in the interval Interstitial markings are diffusely coarsened with chronic features. Hazy opacity at the lung bases likely superimposition of soft tissues. Telemetry leads overlie the chest. Chronic posttraumatic deformity right shoulder consistent with shoulder separation. IMPRESSION: 1. Soft tissue fullness in the right hilar region, new in the interval. CT chest with contrast recommended to further evaluate. 2. Chronic interstitial coarsening. Electronically Signed   By: Donnal Fusi M.D.   On: 09/28/2023 08:20    Procedures Procedures    Medications Ordered in ED Medications  sodium chloride  0.9 % bolus 500 mL (500 mLs Intravenous New Bag/Given 09/28/23 0849)  ketorolac  (TORADOL ) 30 MG/ML injection 15 mg (15 mg Intravenous Given 09/28/23 0849)    ED Course/ Medical Decision Making/ A&P                                 Medical Decision Making Amount and/or  Complexity of Data Reviewed Labs: ordered. Radiology: ordered.  Risk Prescription drug management. Decision regarding hospitalization.   53 year old male presents emergency department with whole body pain, shortness of breath, productive cough.  Admits to crack cocaine abuse, unclear last dose.  Slightly hypertensive on arrival but otherwise stable vitals.  Mostly cooperative but otherwise poor historian.  Blood work shows baseline anemia and kidney dysfunction, urinalysis is unremarkable, UDS positive for cocaine.  Chest x-ray shows right hilar fullness recommend CT.  CT imaging shows pulmonary vascular congestion, cardiomegaly.  Review of most recent echo about a month ago shows EF of 40% or less.  Cardiac markers are elevated, troponin is 60 and BNP is 1169.  No active chest pain at this time.  EKG shows no new changes.  Will offer a dose of benzodiazepine.  Dose of Lasix  was given.  We are pending repeat troponin, if no significant delta anticipate medical admission.  Repeat troponin is 76.  Patient continues to be chest pain-free.  After dose of benzodiazepines symptoms and blood pressure are improved.  Will plan for admission for CHF exacerbation with secondary demand ischemia complicated by cocaine use.  Patients evaluation and results requires admission for further treatment and care.  Spoke with hospitalist, reviewed patient's ED course and they accept admission.  Patient agrees with admission plan, offers no new complaints and is stable/unchanged at time of admit.        Final Clinical Impression(s) / ED Diagnoses Final diagnoses:  None    Rx / DC Orders ED Discharge Orders     None         Flonnie Humphrey, DO 09/28/23 1506

## 2023-09-28 NOTE — Hospital Course (Signed)
 CHF exacerbation Cocaine use disorder  Homelessness  SHOB and leg swelling   Prediabetes -A1c 5.7% on 08/28/2023

## 2023-09-29 ENCOUNTER — Ambulatory Visit: Admitting: Physician Assistant

## 2023-09-29 DIAGNOSIS — I5043 Acute on chronic combined systolic (congestive) and diastolic (congestive) heart failure: Secondary | ICD-10-CM | POA: Diagnosis present

## 2023-09-29 DIAGNOSIS — F1410 Cocaine abuse, uncomplicated: Secondary | ICD-10-CM | POA: Diagnosis present

## 2023-09-29 DIAGNOSIS — I130 Hypertensive heart and chronic kidney disease with heart failure and stage 1 through stage 4 chronic kidney disease, or unspecified chronic kidney disease: Secondary | ICD-10-CM | POA: Diagnosis present

## 2023-09-29 DIAGNOSIS — B182 Chronic viral hepatitis C: Secondary | ICD-10-CM | POA: Diagnosis present

## 2023-09-29 DIAGNOSIS — J432 Centrilobular emphysema: Secondary | ICD-10-CM | POA: Diagnosis present

## 2023-09-29 DIAGNOSIS — D631 Anemia in chronic kidney disease: Secondary | ICD-10-CM | POA: Diagnosis present

## 2023-09-29 DIAGNOSIS — Z91014 Allergy to mammalian meats: Secondary | ICD-10-CM | POA: Diagnosis not present

## 2023-09-29 DIAGNOSIS — D696 Thrombocytopenia, unspecified: Secondary | ICD-10-CM | POA: Diagnosis present

## 2023-09-29 DIAGNOSIS — G4730 Sleep apnea, unspecified: Secondary | ICD-10-CM | POA: Diagnosis present

## 2023-09-29 DIAGNOSIS — I21A1 Myocardial infarction type 2: Secondary | ICD-10-CM | POA: Diagnosis not present

## 2023-09-29 DIAGNOSIS — Z91148 Patient's other noncompliance with medication regimen for other reason: Secondary | ICD-10-CM | POA: Diagnosis not present

## 2023-09-29 DIAGNOSIS — F17210 Nicotine dependence, cigarettes, uncomplicated: Secondary | ICD-10-CM | POA: Diagnosis present

## 2023-09-29 DIAGNOSIS — Z91012 Allergy to eggs: Secondary | ICD-10-CM | POA: Diagnosis not present

## 2023-09-29 DIAGNOSIS — R0602 Shortness of breath: Secondary | ICD-10-CM | POA: Diagnosis present

## 2023-09-29 DIAGNOSIS — Z5900 Homelessness unspecified: Secondary | ICD-10-CM | POA: Diagnosis not present

## 2023-09-29 DIAGNOSIS — Z79899 Other long term (current) drug therapy: Secondary | ICD-10-CM | POA: Diagnosis not present

## 2023-09-29 DIAGNOSIS — N1831 Chronic kidney disease, stage 3a: Secondary | ICD-10-CM | POA: Diagnosis present

## 2023-09-29 DIAGNOSIS — Z7984 Long term (current) use of oral hypoglycemic drugs: Secondary | ICD-10-CM | POA: Diagnosis not present

## 2023-09-29 DIAGNOSIS — T405X1A Poisoning by cocaine, accidental (unintentional), initial encounter: Secondary | ICD-10-CM | POA: Diagnosis present

## 2023-09-29 LAB — BASIC METABOLIC PANEL WITH GFR
Anion gap: 13 (ref 5–15)
BUN: 28 mg/dL — ABNORMAL HIGH (ref 6–20)
CO2: 23 mmol/L (ref 22–32)
Calcium: 9.3 mg/dL (ref 8.9–10.3)
Chloride: 100 mmol/L (ref 98–111)
Creatinine, Ser: 1.52 mg/dL — ABNORMAL HIGH (ref 0.61–1.24)
GFR, Estimated: 55 mL/min — ABNORMAL LOW (ref >60)
Glucose, Bld: 131 mg/dL — ABNORMAL HIGH (ref 70–99)
Potassium: 3.8 mmol/L (ref 3.5–5.1)
Sodium: 136 mmol/L (ref 135–145)

## 2023-09-29 LAB — CBC
HCT: 38.1 % — ABNORMAL LOW (ref 39.0–52.0)
Hemoglobin: 12.6 g/dL — ABNORMAL LOW (ref 13.0–17.0)
MCH: 33.7 pg (ref 26.0–34.0)
MCHC: 33.1 g/dL (ref 30.0–36.0)
MCV: 101.9 fL — ABNORMAL HIGH (ref 80.0–100.0)
Platelets: 153 10*3/uL (ref 150–400)
RBC: 3.74 MIL/uL — ABNORMAL LOW (ref 4.22–5.81)
RDW: 16.6 % — ABNORMAL HIGH (ref 11.5–15.5)
WBC: 4.1 10*3/uL (ref 4.0–10.5)
nRBC: 0 % (ref 0.0–0.2)

## 2023-09-29 MED ORDER — METHOCARBAMOL 500 MG PO TABS
500.0000 mg | ORAL_TABLET | Freq: Three times a day (TID) | ORAL | Status: DC | PRN
  Administered 2023-09-29 – 2023-09-30 (×2): 500 mg via ORAL
  Filled 2023-09-29 (×2): qty 1

## 2023-09-29 NOTE — TOC CM/SW Note (Signed)
 Transition of Care [facility]) - Inpatient Brief Assessment   Patient Details  Name: Charles [name]. MRN: [id number] Date of Birth: [birth year]  Transition of Care [facility]) CM/SW Contact:    Gertha Ku, LCSW Phone Number: 09/29/2023, 3:58 PM   Clinical Narrative: CSW spoke with the pt regarding SDOH concerns. The pt reported that he is already aware of local food banks and does not wish to receive a food bank list. CSW explained that the hospital can only provide a list of shelters and is not able to secure housing for the pt. A shelter list was attached to the pt's AVS. The pt can receive a bus pass at the time of discharge.   Transition of Care Asessment: Insurance and Status: Insurance coverage has been reviewed Patient has primary care physician: Yes Home environment has been reviewed: home homeless Prior level of function:: independent Prior/Current Home Services: No current home services Social Drivers of Health Review: SDOH reviewed no interventions necessary Readmission risk has been reviewed: Yes Transition of care needs: no transition of care needs at this time

## 2023-09-29 NOTE — Progress Notes (Signed)
 PROGRESS NOTE    Charles [name].  ZOX:[id number] DOB: Nov 26, 1970 DOA: 09/28/2023 PCP: Jonathon Neighbors, MD     Brief Narrative:  Charles [name]. is a 53 y.o. male with medical history significant of chronic combined diastolic and systolic CHF, CKD 3A, anemia of chronic disease, chronic hepatitis C, cocaine use disorder, COPD, HTN, homelessness, seizures, chronic thrombocytopenia presenting to the ED with [name] and leg swelling.  Patient was admitted for CHF exacerbation in setting of medication noncompliance and substance abuse.  New events last 24 hours / Subjective: Patient states that he did not sleep much.  States that he does not like taking Lasix , and has no intention of taking his heart medications on discharge unfortunately.  We discussed that this will lead to repeat hospitalization and continued decompensations, which patient stated that he is fine with repeat hospital stay.  Assessment & Plan:   Principal Problem:   Acute on chronic combined systolic (congestive) and diastolic (congestive) heart failure (HCC) Active Problems:   Elevated troponin   Essential hypertension   Cocaine abuse (HCC)   CKD stage 3a, GFR 45-59 ml/min (HCC)   COPD (chronic obstructive pulmonary disease) (HCC)   Chronic hepatitis C (HCC)   Homelessness   Acute on chronic combined systolic and has a heart failure - Insetting of medical noncompliance - BNP 1169.7 - Continue IV Lasix , losartan , BiDil , Jardiance , spironolactone  - Fluid restriction diet, heart healthy diet, strict I's and O's and daily weight  NSTEMI - In setting of above, demand ischemia - Troponin flat  Hypertension - Insetting of medical noncompliance - Continue IV Lasix , losartan , BiDil , Jardiance , spironolactone , Norvasc   Anemia of chronic disease - Stable  CKD stage IIIa - Baseline creatinine 1.5 - Stable  COPD - Without acute exacerbation  Chronic hepatitis C, chronic thrombocytopenia - Stable  Substance  abuse - UDS positive for cocaine    DVT prophylaxis:  rivaroxaban  (XARELTO ) tablet 10 mg Start: 09/28/23 1515 rivaroxaban  (XARELTO ) tablet 10 mg  Code Status: Full code Family Communication: None Disposition Plan: Home/homeless Status is: Observation The patient will require care spanning > 2 midnights and should be moved to inpatient because: IV Lasix     Antimicrobials:  Anti-infectives (From admission, onward)    None        Objective: Vitals:   09/29/23 0047 09/29/23 0624 09/29/23 0650 09/29/23 0844  BP: (!) 115/99 (!) 160/121 (!) 150/101 (!) 140/83  Pulse: 90 84 86 87  Resp:      Temp:      TempSrc:      SpO2: 98% 100%    Weight:   70.7 kg   Height:        Intake/Output Summary (Last 24 hours) at 09/29/2023 1130 Last data filed at 09/29/2023 0300 Gross per 24 hour  Intake --  Output 6450 ml  Net -6450 ml   Filed Weights   09/28/23 0719 09/29/23 0650  Weight: 73.9 kg 70.7 kg    Examination:  General exam: Appears calm Respiratory system: Diminished breath sounds bilaterally Cardiovascular system: S1 & S2 heard, RRR. No murmurs. No pedal edema. Gastrointestinal system: Abdomen is nondistended, soft and nontender. Normal bowel sounds heard. Central nervous system: Alert and oriented Extremities: Symmetric in appearance  Skin: No rashes, lesions or ulcers on exposed skin  Psychiatry: Judgement and insight appear poor  Data Reviewed: I have personally reviewed following labs and imaging studies  CBC: Recent Labs  Lab 09/28/23 0845 09/29/23 0336  WBC 4.9 4.1  NEUTROABS  3.2  --   HGB 11.5* 12.6*  HCT 35.6* 38.1*  MCV 104.7* 101.9*  PLT 120* 153   Basic Metabolic Panel: Recent Labs  Lab 09/28/23 0845 09/29/23 0336  NA 136 136  K 4.0 3.8  CL 108 100  CO2 23 23  GLUCOSE 98 131*  BUN 22* 28*  CREATININE 1.35* 1.52*  CALCIUM  8.7* 9.3  MG 2.2  --    GFR: Estimated Creatinine Clearance: 51.3 mL/min (A) (by C-G formula based on SCr of 1.52  mg/dL (H)). Liver Function Tests: Recent Labs  Lab 09/28/23 0845  AST 30  ALT 25  ALKPHOS 68  BILITOT 0.9  PROT 7.0  ALBUMIN 3.3*   Recent Labs  Lab 09/28/23 0845  LIPASE 30   No results for input(s): "AMMONIA" in the last 168 hours. Coagulation Profile: No results for input(s): "INR", "PROTIME" in the last 168 hours. Cardiac Enzymes: No results for input(s): "CKTOTAL", "CKMB", "CKMBINDEX", "TROPONINI" in the last 168 hours. BNP (last 3 results) No results for input(s): "PROBNP" in the last 8760 hours. HbA1C: No results for input(s): "HGBA1C" in the last 72 hours. CBG: No results for input(s): "GLUCAP" in the last 168 hours. Lipid Profile: No results for input(s): "CHOL", "HDL", "LDLCALC", "TRIG", "CHOLHDL", "LDLDIRECT" in the last 72 hours. Thyroid  Function Tests: No results for input(s): "TSH", "T4TOTAL", "FREET4", "T3FREE", "THYROIDAB" in the last 72 hours. Anemia Panel: Recent Labs    09/28/23 1608  VITAMINB12 238  FOLATE 12.2  FERRITIN 76  TIBC 301  IRON 106   Sepsis Labs: No results for input(s): "PROCALCITON", "LATICACIDVEN" in the last 168 hours.  Recent Results (from the past 240 hours)  Resp panel by RT-PCR (RSV, Flu A&B, Covid) Anterior Nasal Swab     Status: None   Collection Time: 09/28/23  9:19 AM   Specimen: Anterior Nasal Swab  Result Value Ref Range Status   SARS Coronavirus 2 by RT PCR NEGATIVE NEGATIVE Final    Comment: (NOTE) SARS-CoV-2 target nucleic acids are NOT DETECTED.  The SARS-CoV-2 RNA is generally detectable in upper respiratory specimens during the acute phase of infection. The lowest concentration of SARS-CoV-2 viral copies this assay can detect is 138 copies/mL. A negative result does not preclude SARS-Cov-2 infection and should not be used as the sole basis for treatment or other patient management decisions. A negative result may occur with  improper specimen collection/handling, submission of specimen other than  nasopharyngeal swab, presence of viral mutation(s) within the areas targeted by this assay, and inadequate number of viral copies(<138 copies/mL). A negative result must be combined with clinical observations, patient history, and epidemiological information. The expected result is Negative.  Fact Sheet for Patients:  BloggerCourse.com  Fact Sheet for Healthcare Providers:  SeriousBroker.it  This test is no t yet approved or cleared by the [place]  FDA and  has been authorized for detection and/or diagnosis of SARS-CoV-2 by FDA under an Emergency Use Authorization (EUA). This EUA will remain  in effect (meaning this test can be used) for the duration of the COVID-19 declaration under Section 564(b)(1) of the Act, 21 U.S.C.section 360bbb-3(b)(1), unless the authorization is terminated  or revoked sooner.       Influenza A by PCR NEGATIVE NEGATIVE Final   Influenza B by PCR NEGATIVE NEGATIVE Final    Comment: (NOTE) The Xpert Xpress SARS-CoV-2/FLU/RSV plus assay is intended as an aid in the diagnosis of influenza from Nasopharyngeal swab specimens and should not be used as a sole basis  for treatment. Nasal washings and aspirates are unacceptable for Xpert Xpress SARS-CoV-2/FLU/RSV testing.  Fact Sheet for Patients: BloggerCourse.com  Fact Sheet for Healthcare Providers: SeriousBroker.it  This test is not yet approved or cleared by the [place]  FDA and has been authorized for detection and/or diagnosis of SARS-CoV-2 by FDA under an Emergency Use Authorization (EUA). This EUA will remain in effect (meaning this test can be used) for the duration of the COVID-19 declaration under Section 564(b)(1) of the Act, 21 U.S.C. section 360bbb-3(b)(1), unless the authorization is terminated or revoked.     Resp Syncytial Virus by PCR NEGATIVE NEGATIVE Final    Comment:  (NOTE) Fact Sheet for Patients: BloggerCourse.com  Fact Sheet for Healthcare Providers: SeriousBroker.it  This test is not yet approved or cleared by the [place]  FDA and has been authorized for detection and/or diagnosis of SARS-CoV-2 by FDA under an Emergency Use Authorization (EUA). This EUA will remain in effect (meaning this test can be used) for the duration of the COVID-19 declaration under Section 564(b)(1) of the Act, 21 U.S.C. section 360bbb-3(b)(1), unless the authorization is terminated or revoked.  Performed at [facility], [address], [place], [place] [address]       Radiology Studies: CT Angio Chest PE W/Cm &/Or Wo Cm Result Date: 09/28/2023 CLINICAL DATA:  Shortness of breath. Elevated D-dimer. Possible right hilar mass or lymphadenopathy on recent chest radiograph. EXAM: CT ANGIOGRAPHY CHEST WITH CONTRAST TECHNIQUE: Multidetector CT imaging of the chest was performed using the standard protocol during bolus administration of intravenous contrast. Multiplanar CT image reconstructions and MIPs were obtained to evaluate the vascular anatomy. RADIATION DOSE REDUCTION: This exam was performed according to the departmental dose-optimization program which includes automated exposure control, adjustment of the mA and/or kV according to patient size and/or use of iterative reconstruction technique. CONTRAST:  75mL OMNIPAQUE  IOHEXOL  350 MG/ML SOLN COMPARISON:  07/31/2023 FINDINGS: Cardiovascular: Satisfactory opacification of pulmonary arteries noted, and no pulmonary emboli identified. No evidence of thoracic aortic dissection or aneurysm. Moderate cardiomegaly noted. Enlarged central pulmonary are seen with pulmonary trunk measuring 3.5 cm in diameter, which can be seen with pulmonary arterial hypertension. Mediastinum/Nodes: Mild mediastinal lymphadenopathy remains stable, measuring up to 15 mm short axis  in the left paratracheal region. No new or increased sites of lymphadenopathy identified. No right hilar mass or lymphadenopathy identified. Lungs/Pleura: Mild diffuse pulmonary interstitial prominence remains stable. Mild centrilobular emphysema again noted. No evidence of focal consolidation or mass. No pleural effusion. Upper abdomen: No acute findings. Musculoskeletal: No suspicious bone lesions identified. Review of the MIP images confirms the above findings. IMPRESSION: No evidence of pulmonary embolism. No evidence of right hilar mass or lymphadenopathy. Enlarged central pulmonary arteries, which can be seen with pulmonary arterial hypertension. Stable mild diffuse pulmonary interstitial prominence, which may be due to chronic interstitial lung disease or pulmonary vascular congestion. Mild centrilobular emphysema. Stable mild mediastinal lymphadenopathy, likely reactive in etiology. Moderate cardiomegaly. Electronically Signed   By: Marlyce Sine M.D.   On: 09/28/2023 11:26   DG Chest 2 View Result Date: 09/28/2023 CLINICAL DATA:  Shortness of breath.  Body aches. EXAM: CHEST - 2 VIEW COMPARISON:  09/09/2023 FINDINGS: The cardio pericardial silhouette is enlarged. Soft tissue fullness noted in the right hilar region, new in the interval Interstitial markings are diffusely coarsened with chronic features. Hazy opacity at the lung bases likely superimposition of soft tissues. Telemetry leads overlie the chest. Chronic posttraumatic deformity right shoulder consistent with shoulder separation. IMPRESSION:  1. Soft tissue fullness in the right hilar region, new in the interval. CT chest with contrast recommended to further evaluate. 2. Chronic interstitial coarsening. Electronically Signed   By: Donnal Fusi M.D.   On: 09/28/2023 08:20      Scheduled Meds:  amLODipine   10 mg Oral Daily   empagliflozin   10 mg Oral Daily   furosemide   40 mg Intravenous BID   isosorbide -hydrALAZINE   1 tablet Oral BID    losartan   25 mg Oral Daily   rivaroxaban   10 mg Oral Daily   spironolactone   25 mg Oral Daily   Continuous Infusions:   LOS: 0 days   Time spent: 35 minutes   Daren Eck, DO [facility] 09/29/2023, 11:30 AM   Available via Epic secure chat 7am-7pm After these hours, please refer to coverage provider listed on [email or web address]

## 2023-09-30 ENCOUNTER — Inpatient Hospital Stay (HOSPITAL_COMMUNITY)

## 2023-09-30 ENCOUNTER — Other Ambulatory Visit (HOSPITAL_COMMUNITY): Payer: Self-pay

## 2023-09-30 DIAGNOSIS — I5043 Acute on chronic combined systolic (congestive) and diastolic (congestive) heart failure: Secondary | ICD-10-CM | POA: Diagnosis not present

## 2023-09-30 LAB — BASIC METABOLIC PANEL WITH GFR
Anion gap: 8 (ref 5–15)
BUN: 36 mg/dL — ABNORMAL HIGH (ref 6–20)
CO2: 25 mmol/L (ref 22–32)
Calcium: 9 mg/dL (ref 8.9–10.3)
Chloride: 102 mmol/L (ref 98–111)
Creatinine, Ser: 1.47 mg/dL — ABNORMAL HIGH (ref 0.61–1.24)
GFR, Estimated: 57 mL/min — ABNORMAL LOW (ref >60)
Glucose, Bld: 118 mg/dL — ABNORMAL HIGH (ref 70–99)
Potassium: 4.3 mmol/L (ref 3.5–5.1)
Sodium: 135 mmol/L (ref 135–145)

## 2023-09-30 MED ORDER — FUROSEMIDE 40 MG PO TABS: 40.0000 mg | ORAL_TABLET | Freq: Every day | ORAL | Status: DC

## 2023-09-30 MED ORDER — EMPAGLIFLOZIN 10 MG PO TABS
10.0000 mg | ORAL_TABLET | Freq: Every day | ORAL | 0 refills | Status: AC
  Filled 2023-09-30: qty 90, 90d supply, fill #0

## 2023-09-30 MED ORDER — ISOSORB DINITRATE-HYDRALAZINE 20-37.5 MG PO TABS
1.0000 | ORAL_TABLET | Freq: Two times a day (BID) | ORAL | 0 refills | Status: AC
  Filled 2023-09-30: qty 180, 90d supply, fill #0

## 2023-09-30 MED ORDER — LOSARTAN POTASSIUM 25 MG PO TABS
25.0000 mg | ORAL_TABLET | Freq: Every day | ORAL | 0 refills | Status: AC
  Filled 2023-09-30: qty 90, 90d supply, fill #0

## 2023-09-30 MED ORDER — AMLODIPINE BESYLATE 10 MG PO TABS
10.0000 mg | ORAL_TABLET | Freq: Every day | ORAL | 0 refills | Status: AC
  Filled 2023-09-30: qty 90, 90d supply, fill #0

## 2023-09-30 MED ORDER — SPIRONOLACTONE 25 MG PO TABS
25.0000 mg | ORAL_TABLET | Freq: Every day | ORAL | 0 refills | Status: AC
  Filled 2023-09-30: qty 90, 90d supply, fill #0

## 2023-09-30 MED ORDER — FUROSEMIDE 40 MG PO TABS
40.0000 mg | ORAL_TABLET | Freq: Every day | ORAL | 0 refills | Status: AC
  Filled 2023-09-30: qty 90, 90d supply, fill #0

## 2023-09-30 MED ORDER — GABAPENTIN 100 MG PO CAPS
100.0000 mg | ORAL_CAPSULE | Freq: Two times a day (BID) | ORAL | 1 refills | Status: AC
  Filled 2023-09-30: qty 60, 30d supply, fill #0

## 2023-09-30 NOTE — Progress Notes (Signed)
 PIV removed as noted. Central tel notified  via phone by this RN of pt discharge, tele removed. Pt asked about hip and back xrays- within norm  per report. Pt will be able to view results in My Chart.

## 2023-09-30 NOTE — Discharge Summary (Signed)
 Physician Discharge Summary  Suzanna Erp. ZOX:[id number] DOB: March 19, 1971 DOA: 09/28/2023  PCP: Jonathon Neighbors, MD  Admit date: 09/28/2023 Discharge date: 09/30/2023  Admitted From: Home/homeless Disposition:  Home/homeless  Recommendations for Outpatient Follow-up:  Follow up with PCP as well as cardiology He remains at high risk of readmission due to medical noncompliance with medications as well as outpatient follow-up and continued cocaine use  Discharge Condition: Stable CODE STATUS: Full code Diet recommendation: Heart healthy  Brief/Interim Summary: Charles Fiallos. is a 53 y.o. male with medical history significant of chronic combined diastolic and systolic CHF, CKD 3A, anemia of chronic disease, chronic hepatitis C, cocaine use disorder, COPD, HTN, homelessness, seizures, chronic thrombocytopenia presenting to the ED with [facility] and leg swelling.  Patient was admitted for CHF exacerbation in setting of medication noncompliance and substance abuse.  Patient was diuresed with IV Lasix .  He continued to improve and was stable on room air.  Due to his complaints of low back pain and hip pain, x-ray was obtained; results pending at time of discharge.   Discharge Diagnoses:   Principal Problem:   Acute on chronic combined systolic (congestive) and diastolic (congestive) heart failure (HCC) Active Problems:   Elevated troponin   Essential hypertension   Cocaine abuse (HCC)   CKD stage 3a, GFR 45-59 ml/min (HCC)   COPD (chronic obstructive pulmonary disease) (HCC)   Chronic hepatitis C (HCC)   Homelessness    Acute on chronic combined systolic and has a heart failure - Insetting of medical noncompliance - BNP 1169.7 - Continue Lasix , losartan , BiDil , Jardiance , spironolactone  - Fluid restriction diet, heart healthy diet, strict I's and O's and daily weight - Abstain from drug abuse - Euvolemic at this time   NSTEMI - In setting of above, demand ischemia - Troponin  flat   Hypertension - Insetting of medical noncompliance - Continue Lasix , losartan , BiDil , Jardiance , spironolactone , Norvasc    Anemia of chronic disease - Stable   CKD stage IIIa - Baseline creatinine 1.5 - Stable   COPD - Without acute exacerbation   Chronic hepatitis C, chronic thrombocytopenia - Stable   Substance abuse - UDS positive for cocaine    Discharge Instructions  Discharge Instructions     (HEART FAILURE PATIENTS) Call MD:  Anytime you have any of the following symptoms: 1) 3 pound weight gain in 24 hours or 5 pounds in 1 week 2) shortness of breath, with or without a dry hacking cough 3) swelling in the hands, feet or stomach 4) if you have to sleep on extra pillows at night in order to breathe.   Complete by: As directed    Call MD for:  difficulty breathing, headache or visual disturbances   Complete by: As directed    Call MD for:  extreme fatigue   Complete by: As directed    Call MD for:  persistant dizziness or light-headedness   Complete by: As directed    Call MD for:  persistant nausea and vomiting   Complete by: As directed    Call MD for:  severe uncontrolled pain   Complete by: As directed    Call MD for:  temperature >100.4   Complete by: As directed    Diet - low sodium heart healthy   Complete by: As directed    Discharge instructions   Complete by: As directed    You were cared for by a hospitalist during your hospital stay. If you have any questions about your discharge  medications or the care you received while you were in the hospital after you are discharged, you can call the unit and ask to speak with the hospitalist on call if the hospitalist that took care of you is not available. Once you are discharged, your primary care physician will handle any further medical issues. Please note that NO REFILLS for any discharge medications will be authorized once you are discharged, as it is imperative that you return to your primary care  physician (or establish a relationship with a primary care physician if you do not have one) for your aftercare needs so that they can reassess your need for medications and monitor your lab values.   Increase activity slowly   Complete by: As directed       Allergies as of 09/30/2023       Reactions   Banana Nausea And Vomiting   Egg-derived Products Nausea And Vomiting   Pork-derived Products Other (See Comments)   No pork products d/t Religious preference         Medication List     STOP taking these medications    albuterol  108 (90 Base) MCG/ACT inhaler Commonly known as: VENTOLIN  HFA   sodium chloride  0.65 % Soln nasal spray Commonly known as: OCEAN       TAKE these medications    amLODipine  10 MG tablet Commonly known as: NORVASC  Take 1 tablet (10 mg total) by mouth daily.   empagliflozin  10 MG Tabs tablet Commonly known as: Jardiance  Take 1 tablet (10 mg total) by mouth daily.   furosemide  40 MG tablet Commonly known as: LASIX  Take 1 tablet (40 mg total) by mouth daily.   gabapentin  100 MG capsule Commonly known as: NEURONTIN  Take 1 capsule (100 mg total) by mouth 2 (two) times daily.   isosorbide -hydrALAZINE  20-37.5 MG tablet Commonly known as: BIDIL  Take 1 tablet by mouth 2 (two) times daily.   losartan  25 MG tablet Commonly known as: COZAAR  Take 1 tablet (25 mg total) by mouth daily.   spironolactone  25 MG tablet Commonly known as: ALDACTONE  Take 1 tablet (25 mg total) by mouth daily.        Follow-up Information     Jonathon Neighbors, MD Follow up.   Specialty: Family Medicine Contact information: [address] [place] [place] [address] [phone]         [facility] Follow up.   Specialty: Cardiology Contact information: [address] [place]  [address] [phone]               Allergies  Allergen Reactions   Banana Nausea And Vomiting   Egg-Derived Products  Nausea And Vomiting   Pork-Derived Products Other (See Comments)    No pork products d/t Religious preference     Procedures/Studies: CT Angio Chest PE W/Cm &/Or Wo Cm Result Date: 09/28/2023 CLINICAL DATA:  Shortness of breath. Elevated D-dimer. Possible right hilar mass or lymphadenopathy on recent chest radiograph. EXAM: CT ANGIOGRAPHY CHEST WITH CONTRAST TECHNIQUE: Multidetector CT imaging of the chest was performed using the standard protocol during bolus administration of intravenous contrast. Multiplanar CT image reconstructions and MIPs were obtained to evaluate the vascular anatomy. RADIATION DOSE REDUCTION: This exam was performed according to the departmental dose-optimization program which includes automated exposure control, adjustment of the mA and/or kV according to patient size and/or use of iterative reconstruction technique. CONTRAST:  75mL OMNIPAQUE  IOHEXOL  350 MG/ML SOLN COMPARISON:  07/31/2023 FINDINGS: Cardiovascular: Satisfactory opacification  of pulmonary arteries noted, and no pulmonary emboli identified. No evidence of thoracic aortic dissection or aneurysm. Moderate cardiomegaly noted. Enlarged central pulmonary are seen with pulmonary trunk measuring 3.5 cm in diameter, which can be seen with pulmonary arterial hypertension. Mediastinum/Nodes: Mild mediastinal lymphadenopathy remains stable, measuring up to 15 mm short axis in the left paratracheal region. No new or increased sites of lymphadenopathy identified. No right hilar mass or lymphadenopathy identified. Lungs/Pleura: Mild diffuse pulmonary interstitial prominence remains stable. Mild centrilobular emphysema again noted. No evidence of focal consolidation or mass. No pleural effusion. Upper abdomen: No acute findings. Musculoskeletal: No suspicious bone lesions identified. Review of the MIP images confirms the above findings. IMPRESSION: No evidence of pulmonary embolism. No evidence of right hilar mass or lymphadenopathy.  Enlarged central pulmonary arteries, which can be seen with pulmonary arterial hypertension. Stable mild diffuse pulmonary interstitial prominence, which may be due to chronic interstitial lung disease or pulmonary vascular congestion. Mild centrilobular emphysema. Stable mild mediastinal lymphadenopathy, likely reactive in etiology. Moderate cardiomegaly. Electronically Signed   By: Marlyce Sine M.D.   On: 09/28/2023 11:26   DG Chest 2 View Result Date: 09/28/2023 CLINICAL DATA:  Shortness of breath.  Body aches. EXAM: CHEST - 2 VIEW COMPARISON:  09/09/2023 FINDINGS: The cardio pericardial silhouette is enlarged. Soft tissue fullness noted in the right hilar region, new in the interval Interstitial markings are diffusely coarsened with chronic features. Hazy opacity at the lung bases likely superimposition of soft tissues. Telemetry leads overlie the chest. Chronic posttraumatic deformity right shoulder consistent with shoulder separation. IMPRESSION: 1. Soft tissue fullness in the right hilar region, new in the interval. CT chest with contrast recommended to further evaluate. 2. Chronic interstitial coarsening. Electronically Signed   By: Donnal Fusi M.D.   On: 09/28/2023 08:20   DG Chest 1 View Result Date: 09/09/2023 CLINICAL DATA:  [id number] SOB (shortness of breath) [id number] EXAM: CHEST  1 VIEW COMPARISON:  September 04, 2023 FINDINGS: No focal airspace consolidation, pleural effusion, or pneumothorax. Mild cardiomegaly. Tortuous aorta. No acute fracture or destructive lesions. Multilevel thoracic osteophytosis. IMPRESSION: No acute cardiopulmonary abnormality. Electronically Signed   By: Rance Burrows M.D.   On: 09/09/2023 14:02   DG Chest 2 View Result Date: 09/04/2023 CLINICAL DATA:  Chest pain EXAM: CHEST - 2 VIEW COMPARISON:  August 28, 2023 FINDINGS: The heart size and mediastinal contours are within normal limits. Both lungs are clear. The visualized skeletal structures are unremarkable. IMPRESSION:  No active cardiopulmonary disease. Electronically Signed   By: Fredrich Jefferson M.D.   On: 09/04/2023 14:31      Discharge Exam: Vitals:   09/30/23 0449 09/30/23 1100  BP: 100/71 90/64  Pulse: 85 85  Resp:  17  Temp: 98.5 F (36.9 C) 98.1 F (36.7 C)  SpO2: 97% 97%    General: Pt is alert, awake, not in acute distress Cardiovascular: RRR, S1/S2 +, no edema Respiratory: CTA bilaterally, no wheezing, no rhonchi, no respiratory distress, no conversational dyspnea, on room air Abdominal: Soft, NT, ND, bowel sounds + Extremities: no edema, no cyanosis Psych: Normal mood and affect, poor judgement and insight     The results of significant diagnostics from this hospitalization (including imaging, microbiology, ancillary and laboratory) are listed below for reference.     Microbiology: Recent Results (from the past 240 hours)  Resp panel by RT-PCR (RSV, Flu A&B, Covid) Anterior Nasal Swab     Status: None   Collection Time: 09/28/23  9:19 AM  Specimen: Anterior Nasal Swab  Result Value Ref Range Status   SARS Coronavirus 2 by RT PCR NEGATIVE NEGATIVE Final    Comment: (NOTE) SARS-CoV-2 target nucleic acids are NOT DETECTED.  The SARS-CoV-2 RNA is generally detectable in upper respiratory specimens during the acute phase of infection. The lowest concentration of SARS-CoV-2 viral copies this assay can detect is 138 copies/mL. A negative result does not preclude SARS-Cov-2 infection and should not be used as the sole basis for treatment or other patient management decisions. A negative result may occur with  improper specimen collection/handling, submission of specimen other than nasopharyngeal swab, presence of viral mutation(s) within the areas targeted by this assay, and inadequate number of viral copies(<138 copies/mL). A negative result must be combined with clinical observations, patient history, and epidemiological information. The expected result is Negative.  Fact  Sheet for Patients:  BloggerCourse.com  Fact Sheet for Healthcare Providers:  SeriousBroker.it  This test is no t yet approved or cleared by the [place]  FDA and  has been authorized for detection and/or diagnosis of SARS-CoV-2 by FDA under an Emergency Use Authorization (EUA). This EUA will remain  in effect (meaning this test can be used) for the duration of the COVID-19 declaration under Section 564(b)(1) of the Act, 21 U.S.C.section 360bbb-3(b)(1), unless the authorization is terminated  or revoked sooner.       Influenza A by PCR NEGATIVE NEGATIVE Final   Influenza B by PCR NEGATIVE NEGATIVE Final    Comment: (NOTE) The Xpert Xpress SARS-CoV-2/FLU/RSV plus assay is intended as an aid in the diagnosis of influenza from Nasopharyngeal swab specimens and should not be used as a sole basis for treatment. Nasal washings and aspirates are unacceptable for Xpert Xpress SARS-CoV-2/FLU/RSV testing.  Fact Sheet for Patients: BloggerCourse.com  Fact Sheet for Healthcare Providers: SeriousBroker.it  This test is not yet approved or cleared by the [place]  FDA and has been authorized for detection and/or diagnosis of SARS-CoV-2 by FDA under an Emergency Use Authorization (EUA). This EUA will remain in effect (meaning this test can be used) for the duration of the COVID-19 declaration under Section 564(b)(1) of the Act, 21 U.S.C. section 360bbb-3(b)(1), unless the authorization is terminated or revoked.     Resp Syncytial Virus by PCR NEGATIVE NEGATIVE Final    Comment: (NOTE) Fact Sheet for Patients: BloggerCourse.com  Fact Sheet for Healthcare Providers: SeriousBroker.it  This test is not yet approved or cleared by the [place]  FDA and has been authorized for detection and/or diagnosis of SARS-CoV-2 by FDA under an  Emergency Use Authorization (EUA). This EUA will remain in effect (meaning this test can be used) for the duration of the COVID-19 declaration under Section 564(b)(1) of the Act, 21 U.S.C. section 360bbb-3(b)(1), unless the authorization is terminated or revoked.  Performed at [facility], [address] [place], [place] [address]      Labs: BNP (last 3 results) Recent Labs    08/28/23 1639 09/09/23 1234 09/28/23 0902  BNP 163.7* 493.7* 1,169.7*   Basic Metabolic Panel: Recent Labs  Lab 09/28/23 0845 09/29/23 0336 09/30/23 0427  NA 136 136 135  K 4.0 3.8 4.3  CL 108 100 102  CO2 23 23 25   GLUCOSE 98 131* 118*  BUN 22* 28* 36*  CREATININE 1.35* 1.52* 1.47*  CALCIUM  8.7* 9.3 9.0  MG 2.2  --   --    Liver Function Tests: Recent Labs  Lab 09/28/23 0845  AST 30  ALT 25  ALKPHOS 68  BILITOT 0.9  PROT 7.0  ALBUMIN 3.3*   Recent Labs  Lab 09/28/23 0845  LIPASE 30   No results for input(s): "AMMONIA" in the last 168 hours. CBC: Recent Labs  Lab 09/28/23 0845 09/29/23 0336  WBC 4.9 4.1  NEUTROABS 3.2  --   HGB 11.5* 12.6*  HCT 35.6* 38.1*  MCV 104.7* 101.9*  PLT 120* 153   Cardiac Enzymes: No results for input(s): "CKTOTAL", "CKMB", "CKMBINDEX", "TROPONINI" in the last 168 hours. BNP: Invalid input(s): "POCBNP" CBG: No results for input(s): "GLUCAP" in the last 168 hours. D-Dimer No results for input(s): "DDIMER" in the last 72 hours. Hgb A1c No results for input(s): "HGBA1C" in the last 72 hours. Lipid Profile No results for input(s): "CHOL", "HDL", "LDLCALC", "TRIG", "CHOLHDL", "LDLDIRECT" in the last 72 hours. Thyroid  function studies No results for input(s): "TSH", "T4TOTAL", "T3FREE", "THYROIDAB" in the last 72 hours.  Invalid input(s): "FREET3" Anemia work up Recent Labs    09/28/23 1608  VITAMINB12 238  FOLATE 12.2  FERRITIN 76  TIBC 301  IRON 106   Urinalysis    Component Value Date/Time   COLORURINE  STRAW (A) 09/28/2023 0840   APPEARANCEUR CLEAR 09/28/2023 0840   LABSPEC 1.011 09/28/2023 0840   PHURINE 6.0 09/28/2023 0840   GLUCOSEU NEGATIVE 09/28/2023 0840   HGBUR NEGATIVE 09/28/2023 0840   BILIRUBINUR NEGATIVE 09/28/2023 0840   KETONESUR NEGATIVE 09/28/2023 0840   PROTEINUR NEGATIVE 09/28/2023 0840   UROBILINOGEN 1.0 03/10/2015 1203   NITRITE NEGATIVE 09/28/2023 0840   LEUKOCYTESUR NEGATIVE 09/28/2023 0840   Sepsis Labs Recent Labs  Lab 09/28/23 0845 09/29/23 0336  WBC 4.9 4.1   Microbiology Recent Results (from the past 240 hours)  Resp panel by RT-PCR (RSV, Flu A&B, Covid) Anterior Nasal Swab     Status: None   Collection Time: 09/28/23  9:19 AM   Specimen: Anterior Nasal Swab  Result Value Ref Range Status   SARS Coronavirus 2 by RT PCR NEGATIVE NEGATIVE Final    Comment: (NOTE) SARS-CoV-2 target nucleic acids are NOT DETECTED.  The SARS-CoV-2 RNA is generally detectable in upper respiratory specimens during the acute phase of infection. The lowest concentration of SARS-CoV-2 viral copies this assay can detect is 138 copies/mL. A negative result does not preclude SARS-Cov-2 infection and should not be used as the sole basis for treatment or other patient management decisions. A negative result may occur with  improper specimen collection/handling, submission of specimen other than nasopharyngeal swab, presence of viral mutation(s) within the areas targeted by this assay, and inadequate number of viral copies(<138 copies/mL). A negative result must be combined with clinical observations, patient history, and epidemiological information. The expected result is Negative.  Fact Sheet for Patients:  BloggerCourse.com  Fact Sheet for Healthcare Providers:  SeriousBroker.it  This test is no t yet approved or cleared by the [place]  FDA and  has been authorized for detection and/or diagnosis of SARS-CoV-2  by FDA under an Emergency Use Authorization (EUA). This EUA will remain  in effect (meaning this test can be used) for the duration of the COVID-19 declaration under Section 564(b)(1) of the Act, 21 U.S.C.section 360bbb-3(b)(1), unless the authorization is terminated  or revoked sooner.       Influenza A by PCR NEGATIVE NEGATIVE Final   Influenza B by PCR NEGATIVE NEGATIVE Final    Comment: (NOTE) The Xpert Xpress SARS-CoV-2/FLU/RSV plus assay is intended as an aid in the diagnosis of influenza from Nasopharyngeal swab specimens  and should not be used as a sole basis for treatment. Nasal washings and aspirates are unacceptable for Xpert Xpress SARS-CoV-2/FLU/RSV testing.  Fact Sheet for Patients: BloggerCourse.com  Fact Sheet for Healthcare Providers: SeriousBroker.it  This test is not yet approved or cleared by the [place]  FDA and has been authorized for detection and/or diagnosis of SARS-CoV-2 by FDA under an Emergency Use Authorization (EUA). This EUA will remain in effect (meaning this test can be used) for the duration of the COVID-19 declaration under Section 564(b)(1) of the Act, 21 U.S.C. section 360bbb-3(b)(1), unless the authorization is terminated or revoked.     Resp Syncytial Virus by PCR NEGATIVE NEGATIVE Final    Comment: (NOTE) Fact Sheet for Patients: BloggerCourse.com  Fact Sheet for Healthcare Providers: SeriousBroker.it  This test is not yet approved or cleared by the [place]  FDA and has been authorized for detection and/or diagnosis of SARS-CoV-2 by FDA under an Emergency Use Authorization (EUA). This EUA will remain in effect (meaning this test can be used) for the duration of the COVID-19 declaration under Section 564(b)(1) of the Act, 21 U.S.C. section 360bbb-3(b)(1), unless the authorization is terminated or revoked.  Performed at  [facility], [address], [place], [place] [address]      Patient was seen and examined on the day of discharge and was found to be in stable condition. Time coordinating discharge: 40 minutes including assessment and coordination of care, as well as examination of the patient.   SIGNED:  Daren Eck, DO [facility] 09/30/2023, 4:23 PM

## 2023-09-30 NOTE — Progress Notes (Signed)
 Paper scripts brought to [facility] by this RN after patient confirmed he would not be able to get the meds filled. Per [facility] pharmacy pt had medications filled on 08/30/23 for a 90 day fill, only meds able to be filled today are gabapentin , losartan  and furosemide . Those meds were were delivered ina secure bag to patient in room by this RN. Pt can get other meds in July at the [facility] pharmacy or at [facility]- pt will need to ask [facility] to transfer meds from [facility] pharmacy. AVS reviewed w/ pt who verbalized an understanding. No other questions at this time. Pt dressed for d/c to home. Pt has a bus pass for transportation.

## 2023-10-01 ENCOUNTER — Telehealth: Payer: Self-pay

## 2023-10-01 NOTE — Transitions of Care (Post Inpatient/ED Visit) (Signed)
   10/01/2023  Name: Charles [name]. MRN: [id number] DOB: 08/20/1970  Today's TOC FU Call Status: Today's TOC FU Call Status:: Unsuccessful Call (1st Attempt) Unsuccessful Call (1st Attempt) Date: 10/01/23  Attempted to reach the patient regarding the most recent Inpatient/ED visit.  Follow Up Plan: No further outreach attempts will be made at this time. We have been unable to contact the patient.  The patient is homeless and does not have a phone.  Gareld June, BSN, RN [place]  VBCI - [employer] [facility] RN Care Manager [phone]

## 2023-10-08 ENCOUNTER — Other Ambulatory Visit: Payer: Self-pay

## 2023-10-08 ENCOUNTER — Encounter (HOSPITAL_COMMUNITY): Payer: Self-pay | Admitting: Emergency Medicine

## 2023-10-08 ENCOUNTER — Emergency Department (HOSPITAL_COMMUNITY)
Admission: EM | Admit: 2023-10-08 | Discharge: 2023-10-08 | Disposition: A | Discharge status: Home / Self Care | Attending: Emergency Medicine | Admitting: Emergency Medicine

## 2023-10-08 ENCOUNTER — Emergency Department (HOSPITAL_COMMUNITY)

## 2023-10-08 DIAGNOSIS — I517 Cardiomegaly: Secondary | ICD-10-CM | POA: Diagnosis not present

## 2023-10-08 DIAGNOSIS — Z91148 Patient's other noncompliance with medication regimen for other reason: Secondary | ICD-10-CM | POA: Diagnosis not present

## 2023-10-08 DIAGNOSIS — I5042 Chronic combined systolic (congestive) and diastolic (congestive) heart failure: Secondary | ICD-10-CM | POA: Insufficient documentation

## 2023-10-08 DIAGNOSIS — R001 Bradycardia, unspecified: Secondary | ICD-10-CM | POA: Diagnosis not present

## 2023-10-08 DIAGNOSIS — I509 Heart failure, unspecified: Secondary | ICD-10-CM | POA: Diagnosis not present

## 2023-10-08 DIAGNOSIS — I110 Hypertensive heart disease with heart failure: Secondary | ICD-10-CM | POA: Diagnosis not present

## 2023-10-08 DIAGNOSIS — I10 Essential (primary) hypertension: Secondary | ICD-10-CM | POA: Diagnosis not present

## 2023-10-08 DIAGNOSIS — R0602 Shortness of breath: Secondary | ICD-10-CM | POA: Diagnosis not present

## 2023-10-08 LAB — BASIC METABOLIC PANEL WITH GFR
Anion gap: 8 (ref 5–15)
BUN: 17 mg/dL (ref 6–20)
CO2: 24 mmol/L (ref 22–32)
Calcium: 8.7 mg/dL — ABNORMAL LOW (ref 8.9–10.3)
Chloride: 104 mmol/L (ref 98–111)
Creatinine, Ser: 1.63 mg/dL — ABNORMAL HIGH (ref 0.61–1.24)
GFR, Estimated: 50 mL/min — ABNORMAL LOW (ref >60)
Glucose, Bld: 124 mg/dL — ABNORMAL HIGH (ref 70–99)
Potassium: 4 mmol/L (ref 3.5–5.1)
Sodium: 136 mmol/L (ref 135–145)

## 2023-10-08 LAB — CBC
HCT: 43.9 % (ref 39.0–52.0)
Hemoglobin: 14.8 g/dL (ref 13.0–17.0)
MCH: 34.3 pg — ABNORMAL HIGH (ref 26.0–34.0)
MCHC: 33.7 g/dL (ref 30.0–36.0)
MCV: 101.9 fL — ABNORMAL HIGH (ref 80.0–100.0)
Platelets: 140 10*3/uL — ABNORMAL LOW (ref 150–400)
RBC: 4.31 MIL/uL (ref 4.22–5.81)
RDW: 16.4 % — ABNORMAL HIGH (ref 11.5–15.5)
WBC: 2.7 10*3/uL — ABNORMAL LOW (ref 4.0–10.5)
nRBC: 0 % (ref 0.0–0.2)

## 2023-10-08 LAB — TROPONIN I (HIGH SENSITIVITY)
Troponin I (High Sensitivity): 43 ng/L — ABNORMAL HIGH (ref <18)
Troponin I (High Sensitivity): 42 ng/L — ABNORMAL HIGH (ref <18)

## 2023-10-08 LAB — BRAIN NATRIURETIC PEPTIDE: B Natriuretic Peptide: 883.3 pg/mL — ABNORMAL HIGH (ref 0.0–100.0)

## 2023-10-08 MED ORDER — FUROSEMIDE 10 MG/ML IJ SOLN
40.0000 mg | Freq: Once | INTRAMUSCULAR | Status: AC
  Administered 2023-10-08: 40 mg via INTRAVENOUS
  Filled 2023-10-08: qty 4

## 2023-10-08 NOTE — ED Notes (Signed)
Pt given snacks and a drink

## 2023-10-08 NOTE — ED Notes (Signed)
 RN ambulated patient without incident he was able to walk while denying SOB, weakness or chest discomfort.

## 2023-10-08 NOTE — ED Provider Notes (Signed)
 Care assumed from Dr. Val Garin.  At time of transfer of care, patient is awaiting results of chest x-ray and ambulation to see if he gets hypoxic or has worsening shortness of breath.  Patient's workup thus far showed initial troponin improved from what has been in the past and BNP that is not as high as been in the past.   Anticipate reassessment after x-ray to determine disposition.  4:42 PM X-ray returned without pneumonia and stable appearing cardiomegaly.  BNP is elevated but similar to before and troponin is not rising significantly.  Per previous team, we will ambulate patient and see how he does with his breathing and vital signs.  If he gets hypoxic or goes into respiratory distress, patient may require observation admission for safe diuresis and respiratory monitoring given his recent mission for heart failure and shortness of breath recurrence.  Anticipate reassessment after amatory pulse oximetry evaluation.  6:40 PM With pulse oximetry test he was not hypoxic nor did he have respiratory distress.  He was able to eat and drink.  Patient agrees with plan for discharge home given reassuring workup and able to take his home diuretics and home medicines.  He understood return precautions and follow-up instructions and will be discharged for outpatient follow-up.   Clinical Impression: 1. Acute on chronic congestive heart failure, unspecified heart failure type (HCC)   2. Noncompliance with medications     Disposition: Discharge  Condition: Good  I have discussed the results, Dx and Tx plan with the pt(& family if present). He/she/they expressed understanding and agree(s) with the plan. Discharge instructions discussed at great length. Strict return precautions discussed and pt &/or family have verbalized understanding of the instructions. No further questions at time of discharge.    New Prescriptions   No medications on file    Follow Up: Jonathon Neighbors, MD [address] [place],  [address] [place] [address] [phone]     [facility] Emergency Department at [facility] [address] [place]  [address] [phone]       Trayonna Bachmeier, Marine Sia, MD 10/08/23 [phone]

## 2023-10-08 NOTE — ED Triage Notes (Signed)
 Patient to ED by EMS states he walked over to fire department to have them call 911 for SOB. SOB started this morning EMS gave Duo neb and  Atrovent . 164/90 94 24 98% RA

## 2023-10-08 NOTE — ED Notes (Signed)
 Food and nutrition given to patient, tolerated well

## 2023-10-08 NOTE — ED Notes (Signed)
 Pt ambulated with pule ox. O2 stayed between 97-100 with a good pleth. Pt was taking deep breath when he got back to the room stated he was short of breath, O2 remained at 100%.

## 2023-10-08 NOTE — Discharge Instructions (Signed)
 Your history, exam, and workup today did not show clear evidence of pneumonia or profound fluid overload.  We did suspect there was some degree of extra fluid which we gave you IV diuretics.  Please continue your home diuretic regimen and rest.  We were able to ambulate you without any hypoxia and per previous team plan, we will discharge for outpatient follow-up.  Please rest and take your medicines.  If any symptoms change or worsen acutely, return to the nearest emergency department.

## 2023-10-08 NOTE — ED Notes (Signed)
 Patient sitting up in bed, eating, and denies pain or shortness of breath. JRPRN

## 2023-10-08 NOTE — ED Provider Notes (Signed)
 [place] EMERGENCY DEPARTMENT AT [facility] Provider Note   CSN: [id number] Arrival date & time: 10/08/23  1241     History  Chief Complaint  Patient presents with   Shortness of Breath    Charles [name]. is a 53 y.o. male.   Shortness of Breath Patient presents with shortness of breath.  Came for tomorrow.  Worsening shortness of breath today.  History of pneumonia and heart failure.  Recent had admission to hospital for CHF.  Discharged 8 days ago.  States he has not been taking his medicines and now more short of breath.  Also been using cocaine as recently as 2 days ago.  Some chest tightness.  States he has swelling.    Past Medical History:  Diagnosis Date   Acute metabolic encephalopathy 07/14/2023   Aplastic anemia (HCC)    Arthritis    "left ankle" (10/17/2014)   Bilateral pneumonia 10/17/2014   Chronic combined systolic and diastolic heart failure (HCC)    Hepatitis    "think it was B" (10/17/2014)   History of blood transfusion "several"   "related to aplastic anemia"   Hypertension    Laceration of spleen    s/p embolization   Polysubstance abuse (HCC)    cocaine, benzo's, opiates, THC   Sleep apnea    "wore mask in prison; 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" (10/17/2014)    Home Medications Prior to Admission medications   Medication Sig Start Date End Date Taking? Authorizing Provider  amLODipine  (NORVASC ) 10 MG tablet Take 1 tablet (10 mg total) by mouth daily. Patient not taking: Reported on 10/08/2023 09/30/23 12/29/23  Daren Eck, DO  empagliflozin  (JARDIANCE ) 10 MG TABS tablet Take 1 tablet (10 mg total) by mouth daily. Patient not taking: Reported on 10/08/2023 09/30/23   Daren Eck, DO  furosemide  (LASIX ) 40 MG tablet Take 1 tablet (40 mg total) by mouth daily. Patient not taking: Reported on 10/08/2023 09/30/23   Daren Eck, DO  gabapentin  (NEURONTIN ) 100 MG capsule Take 1 capsule (100 mg total) by mouth 2 (two) times daily. Patient not  taking: Reported on 10/08/2023 09/30/23 11/29/23  Daren Eck, DO  isosorbide -hydrALAZINE  (BIDIL ) 20-37.5 MG tablet Take 1 tablet by mouth 2 (two) times daily. Patient not taking: Reported on 10/08/2023 09/30/23   Daren Eck, DO  losartan  (COZAAR ) 25 MG tablet Take 1 tablet (25 mg total) by mouth daily. Patient not taking: Reported on 10/08/2023 09/30/23   Daren Eck, DO  spironolactone  (ALDACTONE ) 25 MG tablet Take 1 tablet (25 mg total) by mouth daily. Patient not taking: Reported on 10/08/2023 09/30/23 12/29/23  Daren Eck, DO      Allergies    Banana, Egg-derived products, and Pork-derived products    Review of Systems   Review of Systems  Respiratory:  Positive for shortness of breath.     Physical Exam Updated Vital Signs BP (!) 146/89 (BP Location: Left Arm)   Pulse 85   Temp 98.3 F (36.8 C) (Oral)   Resp (!) 23   Ht 5\' 6"  (1.676 m)   Wt 70.8 kg   SpO2 100% Comment: Simultaneous filing. User may not have seen previous data.  BMI 25.18 kg/m  Physical Exam Cardiovascular:     Rate and Rhythm: Regular rhythm.  Pulmonary:     Effort: Tachypnea present.     Comments: Somewhat harsh breath sounds. Chest:     Chest wall: No tenderness.  Musculoskeletal:     Comments: Mild edema bilateral lower  extremities.  Neurological:     Mental Status: He is alert.     ED Results / Procedures / Treatments   Labs (all labs ordered are listed, but only abnormal results are displayed) Labs Reviewed  BASIC METABOLIC PANEL WITH GFR - Abnormal; Notable for the following components:      Result Value   Glucose, Bld 124 (*)    Creatinine, Ser 1.63 (*)    Calcium  8.7 (*)    GFR, Estimated 50 (*)    All other components within normal limits  CBC - Abnormal; Notable for the following components:   WBC 2.7 (*)    MCV 101.9 (*)    MCH 34.3 (*)    RDW 16.4 (*)    Platelets 140 (*)    All other components within normal limits  BRAIN NATRIURETIC PEPTIDE - Abnormal; Notable for  the following components:   B Natriuretic Peptide 883.3 (*)    All other components within normal limits  TROPONIN I (HIGH SENSITIVITY) - Abnormal; Notable for the following components:   Troponin I (High Sensitivity) 43 (*)    All other components within normal limits  TROPONIN I (HIGH SENSITIVITY)    EKG EKG Interpretation Date/Time:  Wednesday Oct 08 2023 12:48:09 EDT Ventricular Rate:  86 PR Interval:  155 QRS Duration:  73 QT Interval:  387 QTC Calculation: 463 R Axis:   65  Text Interpretation: Sinus rhythm Atrial premature complex Left ventricular hypertrophy Anterior Q waves, possibly due to LVH Abnormal T, consider ischemia, lateral leads No significant change since last tracing Confirmed by Mozell Arias [phone]) on 10/08/2023 1:16:22 PM  Radiology No results found.  Procedures Procedures    Medications Ordered in ED Medications  furosemide  (LASIX ) injection 40 mg (has no administration in time range)    ED Course/ Medical Decision Making/ A&P                                 Medical Decision Making Amount and/or Complexity of Data Reviewed Labs: ordered. Radiology: ordered.  Risk Prescription drug management.   Patient shortness of breath.  History of CHF.  History of noncompliance.  States he has been noncompliance with his medicine since leaving.  States he feels if he has much fluid.  I think that is the most likely diagnosis.  Will get x-ray and basic blood work.  States last used cocaine in the last couple days.  I have reviewed recent discharge notes  Social determinant of health includes his drug use and homelessness.  Patient's ENP is elevated but lower has been on previous admissions.  Same with troponin.  Chest x-ray pending.  Does appear to be somewhat volume overloaded clinically.  Will give IV Lasix .  Care turned over to Dr. Manus Sellers         Final Clinical Impression(s) / ED Diagnoses Final diagnoses:  Acute on chronic  congestive heart failure, unspecified heart failure type [facility])  Noncompliance with medications    Rx / DC Orders ED Discharge Orders     None         Mozell Arias, MD 10/08/23 1444

## 2023-10-15 ENCOUNTER — Emergency Department (HOSPITAL_COMMUNITY)

## 2023-10-15 ENCOUNTER — Other Ambulatory Visit: Payer: Self-pay

## 2023-10-15 ENCOUNTER — Encounter (HOSPITAL_COMMUNITY): Payer: Self-pay

## 2023-10-15 ENCOUNTER — Other Ambulatory Visit (HOSPITAL_COMMUNITY): Payer: Self-pay

## 2023-10-15 ENCOUNTER — Emergency Department (HOSPITAL_COMMUNITY)
Admission: EM | Admit: 2023-10-15 | Discharge: 2023-10-15 | Disposition: A | Discharge status: Home / Self Care | Attending: Emergency Medicine | Admitting: Emergency Medicine

## 2023-10-15 DIAGNOSIS — R0689 Other abnormalities of breathing: Secondary | ICD-10-CM | POA: Diagnosis not present

## 2023-10-15 DIAGNOSIS — Z91148 Patient's other noncompliance with medication regimen for other reason: Secondary | ICD-10-CM | POA: Insufficient documentation

## 2023-10-15 DIAGNOSIS — R0789 Other chest pain: Secondary | ICD-10-CM | POA: Diagnosis not present

## 2023-10-15 DIAGNOSIS — I509 Heart failure, unspecified: Secondary | ICD-10-CM

## 2023-10-15 DIAGNOSIS — M19012 Primary osteoarthritis, left shoulder: Secondary | ICD-10-CM | POA: Diagnosis not present

## 2023-10-15 DIAGNOSIS — I110 Hypertensive heart disease with heart failure: Secondary | ICD-10-CM | POA: Diagnosis not present

## 2023-10-15 DIAGNOSIS — D72829 Elevated white blood cell count, unspecified: Secondary | ICD-10-CM | POA: Insufficient documentation

## 2023-10-15 DIAGNOSIS — M25512 Pain in left shoulder: Secondary | ICD-10-CM | POA: Diagnosis present

## 2023-10-15 DIAGNOSIS — F1490 Cocaine use, unspecified, uncomplicated: Secondary | ICD-10-CM | POA: Diagnosis not present

## 2023-10-15 DIAGNOSIS — I5042 Chronic combined systolic (congestive) and diastolic (congestive) heart failure: Secondary | ICD-10-CM | POA: Insufficient documentation

## 2023-10-15 DIAGNOSIS — R0989 Other specified symptoms and signs involving the circulatory and respiratory systems: Secondary | ICD-10-CM | POA: Diagnosis not present

## 2023-10-15 LAB — TROPONIN I (HIGH SENSITIVITY)
Troponin I (High Sensitivity): 67 ng/L — ABNORMAL HIGH (ref <18)
Troponin I (High Sensitivity): 71 ng/L — ABNORMAL HIGH (ref <18)

## 2023-10-15 LAB — BASIC METABOLIC PANEL WITH GFR
Anion gap: 11 (ref 5–15)
BUN: 20 mg/dL (ref 6–20)
CO2: 21 mmol/L — ABNORMAL LOW (ref 22–32)
Calcium: 8.6 mg/dL — ABNORMAL LOW (ref 8.9–10.3)
Chloride: 103 mmol/L (ref 98–111)
Creatinine, Ser: 1.76 mg/dL — ABNORMAL HIGH (ref 0.61–1.24)
GFR, Estimated: 46 mL/min — ABNORMAL LOW (ref >60)
Glucose, Bld: 88 mg/dL (ref 70–99)
Potassium: 3.9 mmol/L (ref 3.5–5.1)
Sodium: 135 mmol/L (ref 135–145)

## 2023-10-15 LAB — CBC
HCT: 37.7 % — ABNORMAL LOW (ref 39.0–52.0)
Hemoglobin: 12.7 g/dL — ABNORMAL LOW (ref 13.0–17.0)
MCH: 35 pg — ABNORMAL HIGH (ref 26.0–34.0)
MCHC: 33.7 g/dL (ref 30.0–36.0)
MCV: 103.9 fL — ABNORMAL HIGH (ref 80.0–100.0)
Platelets: 138 10*3/uL — ABNORMAL LOW (ref 150–400)
RBC: 3.63 MIL/uL — ABNORMAL LOW (ref 4.22–5.81)
RDW: 17.2 % — ABNORMAL HIGH (ref 11.5–15.5)
WBC: 3.1 10*3/uL — ABNORMAL LOW (ref 4.0–10.5)
nRBC: 0 % (ref 0.0–0.2)

## 2023-10-15 LAB — BRAIN NATRIURETIC PEPTIDE: B Natriuretic Peptide: 686.9 pg/mL — ABNORMAL HIGH (ref 0.0–100.0)

## 2023-10-15 MED ORDER — IBUPROFEN 600 MG PO TABS
600.0000 mg | ORAL_TABLET | Freq: Four times a day (QID) | ORAL | 0 refills | Status: AC | PRN
  Filled 2023-10-15: qty 30, 8d supply, fill #0

## 2023-10-15 MED ORDER — FUROSEMIDE 10 MG/ML IJ SOLN
40.0000 mg | Freq: Once | INTRAMUSCULAR | Status: DC
  Filled 2023-10-15: qty 4

## 2023-10-15 MED ORDER — HYDROCODONE-ACETAMINOPHEN 5-325 MG PO TABS
1.0000 | ORAL_TABLET | Freq: Once | ORAL | Status: AC
  Administered 2023-10-15: 1 via ORAL
  Filled 2023-10-15: qty 1

## 2023-10-15 MED ORDER — LIDOCAINE 5 % EX PTCH
1.0000 | MEDICATED_PATCH | Freq: Once | CUTANEOUS | Status: DC
  Administered 2023-10-15: 1 via TRANSDERMAL
  Filled 2023-10-15: qty 1

## 2023-10-15 MED ORDER — ACETAMINOPHEN 325 MG PO TABS
650.0000 mg | ORAL_TABLET | Freq: Four times a day (QID) | ORAL | 0 refills | Status: AC | PRN
  Filled 2023-10-15: qty 36, 5d supply, fill #0

## 2023-10-15 MED ORDER — CYCLOBENZAPRINE HCL 10 MG PO TABS
10.0000 mg | ORAL_TABLET | Freq: Two times a day (BID) | ORAL | 0 refills | Status: AC | PRN
Start: 2023-10-15 — End: ?
  Filled 2023-10-15: qty 20, 10d supply, fill #0

## 2023-10-15 MED ORDER — KETOROLAC TROMETHAMINE 15 MG/ML IJ SOLN
15.0000 mg | Freq: Once | INTRAMUSCULAR | Status: AC
  Administered 2023-10-15: 15 mg via INTRAVENOUS
  Filled 2023-10-15: qty 1

## 2023-10-15 MED ORDER — SODIUM CHLORIDE 0.9 % IV BOLUS
500.0000 mL | Freq: Once | INTRAVENOUS | Status: AC
  Administered 2023-10-15: 500 mL via INTRAVENOUS

## 2023-10-15 NOTE — ED Notes (Signed)
 Dr Wallace Cullens at bedside
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
## 2023-10-15 NOTE — Discharge Instructions (Addendum)
 It was a pleasure caring for you today in the emergency department.  Please take your home medications as prescribed.  Stop using cocaine  Follow-up orthopedics if left shoulder pain persists  Please return to the emergency department for any worsening or worrisome symptoms.

## 2023-10-15 NOTE — Progress Notes (Signed)
 Orthopedic Tech Progress Note Patient Details:  Charles [name]. [birth year] [id number]  Ortho Devices Type of Ortho Device: Shoulder immobilizer Ortho Device/Splint Location: left Ortho Device/Splint Interventions: Ordered, Application, Adjustment   Post Interventions Patient Tolerated: Well Instructions Provided: Adjustment of device, Care of device  [name] 10/15/2023, 3:29 PM
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
## 2023-10-16 DIAGNOSIS — S4982XA Other specified injuries of left shoulder and upper arm, initial encounter: Secondary | ICD-10-CM | POA: Diagnosis not present

## 2023-10-17 DIAGNOSIS — M25512 Pain in left shoulder: Secondary | ICD-10-CM | POA: Diagnosis not present

## 2023-10-17 DIAGNOSIS — I517 Cardiomegaly: Secondary | ICD-10-CM | POA: Diagnosis not present

## 2023-10-17 DIAGNOSIS — R0602 Shortness of breath: Secondary | ICD-10-CM | POA: Diagnosis not present

## 2023-10-17 DIAGNOSIS — R040 Epistaxis: Secondary | ICD-10-CM | POA: Diagnosis not present

## 2023-10-17 DIAGNOSIS — Z5900 Homelessness unspecified: Secondary | ICD-10-CM | POA: Diagnosis not present

## 2023-10-17 DIAGNOSIS — I5023 Acute on chronic systolic (congestive) heart failure: Secondary | ICD-10-CM | POA: Diagnosis not present

## 2023-10-17 DIAGNOSIS — R069 Unspecified abnormalities of breathing: Secondary | ICD-10-CM | POA: Diagnosis not present

## 2023-10-17 DIAGNOSIS — R9431 Abnormal electrocardiogram [ECG] [EKG]: Secondary | ICD-10-CM | POA: Diagnosis not present

## 2023-10-17 DIAGNOSIS — R0603 Acute respiratory distress: Secondary | ICD-10-CM | POA: Diagnosis not present

## 2023-10-17 DIAGNOSIS — F1490 Cocaine use, unspecified, uncomplicated: Secondary | ICD-10-CM | POA: Diagnosis not present

## 2023-10-17 DIAGNOSIS — F1410 Cocaine abuse, uncomplicated: Secondary | ICD-10-CM | POA: Diagnosis not present

## 2023-10-17 DIAGNOSIS — M19012 Primary osteoarthritis, left shoulder: Secondary | ICD-10-CM | POA: Diagnosis not present

## 2023-10-17 DIAGNOSIS — R0600 Dyspnea, unspecified: Secondary | ICD-10-CM | POA: Diagnosis not present

## 2023-10-17 DIAGNOSIS — I5A Non-ischemic myocardial injury (non-traumatic): Secondary | ICD-10-CM | POA: Diagnosis not present

## 2023-10-17 DIAGNOSIS — R0689 Other abnormalities of breathing: Secondary | ICD-10-CM | POA: Diagnosis not present

## 2023-10-17 DIAGNOSIS — Z91148 Patient's other noncompliance with medication regimen for other reason: Secondary | ICD-10-CM | POA: Diagnosis not present

## 2023-10-17 DIAGNOSIS — I499 Cardiac arrhythmia, unspecified: Secondary | ICD-10-CM | POA: Diagnosis not present

## 2023-10-17 DIAGNOSIS — I5020 Unspecified systolic (congestive) heart failure: Secondary | ICD-10-CM | POA: Diagnosis not present

## 2023-10-17 DIAGNOSIS — R7989 Other specified abnormal findings of blood chemistry: Secondary | ICD-10-CM | POA: Diagnosis not present

## 2023-10-17 DIAGNOSIS — I110 Hypertensive heart disease with heart failure: Secondary | ICD-10-CM | POA: Diagnosis not present

## 2023-10-17 DIAGNOSIS — I509 Heart failure, unspecified: Secondary | ICD-10-CM | POA: Diagnosis not present

## 2023-10-17 DIAGNOSIS — I5189 Other ill-defined heart diseases: Secondary | ICD-10-CM | POA: Diagnosis not present

## 2023-10-18 DIAGNOSIS — R0602 Shortness of breath: Secondary | ICD-10-CM | POA: Diagnosis not present

## 2023-10-20 DIAGNOSIS — F1410 Cocaine abuse, uncomplicated: Secondary | ICD-10-CM | POA: Diagnosis not present

## 2023-10-20 DIAGNOSIS — I5020 Unspecified systolic (congestive) heart failure: Secondary | ICD-10-CM | POA: Diagnosis not present

## 2023-10-21 DIAGNOSIS — M19012 Primary osteoarthritis, left shoulder: Secondary | ICD-10-CM | POA: Diagnosis not present

## 2023-10-21 DIAGNOSIS — M25512 Pain in left shoulder: Secondary | ICD-10-CM | POA: Diagnosis not present

## 2023-10-23 DIAGNOSIS — R0602 Shortness of breath: Secondary | ICD-10-CM | POA: Diagnosis not present

## 2023-10-24 ENCOUNTER — Other Ambulatory Visit (HOSPITAL_COMMUNITY): Payer: Self-pay

## 2023-11-10 DIAGNOSIS — K2289 Other specified disease of esophagus: Secondary | ICD-10-CM | POA: Diagnosis not present

## 2023-11-10 DIAGNOSIS — I10 Essential (primary) hypertension: Secondary | ICD-10-CM | POA: Diagnosis not present

## 2023-11-10 DIAGNOSIS — I5020 Unspecified systolic (congestive) heart failure: Secondary | ICD-10-CM | POA: Diagnosis not present

## 2023-11-10 DIAGNOSIS — F17210 Nicotine dependence, cigarettes, uncomplicated: Secondary | ICD-10-CM | POA: Diagnosis not present

## 2023-11-10 DIAGNOSIS — I110 Hypertensive heart disease with heart failure: Secondary | ICD-10-CM | POA: Diagnosis not present

## 2023-11-10 DIAGNOSIS — N3289 Other specified disorders of bladder: Secondary | ICD-10-CM | POA: Diagnosis not present

## 2023-11-10 DIAGNOSIS — K4090 Unilateral inguinal hernia, without obstruction or gangrene, not specified as recurrent: Secondary | ICD-10-CM | POA: Diagnosis not present

## 2023-11-10 DIAGNOSIS — Z5900 Homelessness unspecified: Secondary | ICD-10-CM | POA: Diagnosis not present

## 2024-01-08 ENCOUNTER — Other Ambulatory Visit (HOSPITAL_COMMUNITY): Payer: Self-pay

## 2024-02-16 DIAGNOSIS — F17290 Nicotine dependence, other tobacco product, uncomplicated: Secondary | ICD-10-CM | POA: Diagnosis not present

## 2024-02-16 DIAGNOSIS — I4581 Long QT syndrome: Secondary | ICD-10-CM | POA: Diagnosis not present

## 2024-02-16 DIAGNOSIS — Z91148 Patient's other noncompliance with medication regimen for other reason: Secondary | ICD-10-CM | POA: Diagnosis not present

## 2024-02-16 DIAGNOSIS — I509 Heart failure, unspecified: Secondary | ICD-10-CM | POA: Diagnosis not present

## 2024-02-16 DIAGNOSIS — R079 Chest pain, unspecified: Secondary | ICD-10-CM | POA: Diagnosis not present

## 2024-02-16 DIAGNOSIS — R0789 Other chest pain: Secondary | ICD-10-CM | POA: Diagnosis not present

## 2024-02-16 DIAGNOSIS — I110 Hypertensive heart disease with heart failure: Secondary | ICD-10-CM | POA: Diagnosis not present

## 2024-02-16 DIAGNOSIS — R0989 Other specified symptoms and signs involving the circulatory and respiratory systems: Secondary | ICD-10-CM | POA: Diagnosis not present

## 2024-02-16 DIAGNOSIS — R5383 Other fatigue: Secondary | ICD-10-CM | POA: Diagnosis not present

## 2024-02-16 DIAGNOSIS — M7989 Other specified soft tissue disorders: Secondary | ICD-10-CM | POA: Diagnosis not present

## 2024-06-15 ENCOUNTER — Encounter: Payer: Self-pay | Admitting: *Deleted

## 2024-06-15 NOTE — Progress Notes (Signed)
 Charles [name].                                          MRN: [id number]   06/15/2024   The [facility] Quality Team Specialist reviewed this patient medical record for the purposes of chart review for care gap closure. The following were reviewed: chart review for care gap closure-controlling blood pressure and kidney health evaluation for diabetes:eGFR  and uACR.    [facility] Quality Team
